# Patient Record
Sex: Female | Born: 1950
Health system: Southern US, Community
[De-identification: ages and names within clinical notes are randomized; demographics above are authoritative.]

## PROBLEM LIST (undated history)

## (undated) ENCOUNTER — Ambulatory Visit: Admission: EM | Source: Home / Self Care

## (undated) DIAGNOSIS — E1169 Type 2 diabetes mellitus with other specified complication: Secondary | ICD-10-CM

## (undated) DIAGNOSIS — K219 Gastro-esophageal reflux disease without esophagitis: Secondary | ICD-10-CM

## (undated) DIAGNOSIS — J302 Other seasonal allergic rhinitis: Secondary | ICD-10-CM

## (undated) DIAGNOSIS — M7601 Gluteal tendinitis, right hip: Secondary | ICD-10-CM

## (undated) DIAGNOSIS — I10 Essential (primary) hypertension: Secondary | ICD-10-CM

## (undated) DIAGNOSIS — G459 Transient cerebral ischemic attack, unspecified: Secondary | ICD-10-CM

## (undated) DIAGNOSIS — Z87440 Personal history of urinary (tract) infections: Secondary | ICD-10-CM

## (undated) DIAGNOSIS — M7551 Bursitis of right shoulder: Secondary | ICD-10-CM

## (undated) DIAGNOSIS — M797 Fibromyalgia: Secondary | ICD-10-CM

## (undated) DIAGNOSIS — F329 Major depressive disorder, single episode, unspecified: Secondary | ICD-10-CM

## (undated) DIAGNOSIS — S92352A Displaced fracture of fifth metatarsal bone, left foot, initial encounter for closed fracture: Secondary | ICD-10-CM

## (undated) DIAGNOSIS — Z8739 Personal history of other diseases of the musculoskeletal system and connective tissue: Secondary | ICD-10-CM

## (undated) DIAGNOSIS — M7062 Trochanteric bursitis, left hip: Secondary | ICD-10-CM

## (undated) DIAGNOSIS — E119 Type 2 diabetes mellitus without complications: Secondary | ICD-10-CM

## (undated) DIAGNOSIS — IMO0001 Reserved for inherently not codable concepts without codable children: Secondary | ICD-10-CM

## (undated) DIAGNOSIS — M5416 Radiculopathy, lumbar region: Secondary | ICD-10-CM

## (undated) DIAGNOSIS — M999 Biomechanical lesion, unspecified: Secondary | ICD-10-CM

## (undated) DIAGNOSIS — N390 Urinary tract infection, site not specified: Secondary | ICD-10-CM

## (undated) DIAGNOSIS — E559 Vitamin D deficiency, unspecified: Secondary | ICD-10-CM

## (undated) DIAGNOSIS — S9304XA Dislocation of right ankle joint, initial encounter: Secondary | ICD-10-CM

## (undated) DIAGNOSIS — J069 Acute upper respiratory infection, unspecified: Secondary | ICD-10-CM

## (undated) DIAGNOSIS — E785 Hyperlipidemia, unspecified: Secondary | ICD-10-CM

## (undated) DIAGNOSIS — Z8679 Personal history of other diseases of the circulatory system: Secondary | ICD-10-CM

## (undated) DIAGNOSIS — R002 Palpitations: Secondary | ICD-10-CM

## (undated) DIAGNOSIS — T887XXA Unspecified adverse effect of drug or medicament, initial encounter: Secondary | ICD-10-CM

## (undated) DIAGNOSIS — G43909 Migraine, unspecified, not intractable, without status migrainosus: Secondary | ICD-10-CM

## (undated) DIAGNOSIS — G479 Sleep disorder, unspecified: Secondary | ICD-10-CM

## (undated) DIAGNOSIS — M7542 Impingement syndrome of left shoulder: Secondary | ICD-10-CM

## (undated) DIAGNOSIS — J329 Chronic sinusitis, unspecified: Secondary | ICD-10-CM

## (undated) DIAGNOSIS — M7061 Trochanteric bursitis, right hip: Secondary | ICD-10-CM

## (undated) HISTORY — DX: Sleep disorder, unspecified: G47.9

## (undated) HISTORY — DX: Transient cerebral ischemic attack, unspecified: G45.9

## (undated) HISTORY — DX: Hyperlipidemia, unspecified: E78.5

## (undated) HISTORY — PX: TONSILLECTOMY: SUR1361

## (undated) HISTORY — DX: Essential (primary) hypertension: I10

## (undated) HISTORY — PX: COLONOSCOPY: SHX174

## (undated) HISTORY — DX: Personal history of urinary (tract) infections: Z87.440

## (undated) HISTORY — DX: Migraine, unspecified, not intractable, without status migrainosus: G43.909

## (undated) HISTORY — PX: WISDOM TOOTH EXTRACTION: SHX21

---
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
## 1981-12-14 HISTORY — PX: TOTAL ABDOMINAL HYSTERECTOMY: SHX209

## 1995-12-15 HISTORY — PX: CORONARY ANGIOPLASTY: SHX604

## 1998-08-04 ENCOUNTER — Emergency Department (HOSPITAL_COMMUNITY): Admission: EM | Admit: 1998-08-04 | Discharge: 1998-08-04 | Payer: Self-pay | Admitting: Emergency Medicine

## 1999-07-31 ENCOUNTER — Other Ambulatory Visit: Admission: RE | Admit: 1999-07-31 | Discharge: 1999-07-31 | Payer: Self-pay | Admitting: Obstetrics and Gynecology

## 2000-03-16 ENCOUNTER — Emergency Department (HOSPITAL_COMMUNITY): Admission: EM | Admit: 2000-03-16 | Discharge: 2000-03-16 | Payer: Self-pay | Admitting: Emergency Medicine

## 2000-12-14 HISTORY — PX: POLYPECTOMY: SHX149

## 2001-05-16 ENCOUNTER — Other Ambulatory Visit: Admission: RE | Admit: 2001-05-16 | Discharge: 2001-05-16 | Payer: Self-pay | Admitting: Obstetrics and Gynecology

## 2001-10-24 ENCOUNTER — Emergency Department (HOSPITAL_COMMUNITY): Admission: EM | Admit: 2001-10-24 | Discharge: 2001-10-24 | Payer: Self-pay | Admitting: Emergency Medicine

## 2001-10-24 ENCOUNTER — Encounter: Payer: Self-pay | Admitting: Emergency Medicine

## 2001-12-11 ENCOUNTER — Encounter: Payer: Self-pay | Admitting: Emergency Medicine

## 2001-12-11 ENCOUNTER — Emergency Department (HOSPITAL_COMMUNITY): Admission: EM | Admit: 2001-12-11 | Discharge: 2001-12-11 | Payer: Self-pay | Admitting: Emergency Medicine

## 2001-12-19 ENCOUNTER — Emergency Department (HOSPITAL_COMMUNITY): Admission: EM | Admit: 2001-12-19 | Discharge: 2001-12-19 | Payer: Self-pay

## 2002-10-20 ENCOUNTER — Other Ambulatory Visit: Admission: RE | Admit: 2002-10-20 | Discharge: 2002-10-20 | Payer: Self-pay | Admitting: Family Medicine

## 2002-12-26 ENCOUNTER — Inpatient Hospital Stay (HOSPITAL_COMMUNITY): Admission: AD | Admit: 2002-12-26 | Discharge: 2002-12-27 | Payer: Self-pay | Admitting: Internal Medicine

## 2002-12-26 ENCOUNTER — Encounter: Payer: Self-pay | Admitting: Internal Medicine

## 2003-01-01 ENCOUNTER — Emergency Department (HOSPITAL_COMMUNITY): Admission: EM | Admit: 2003-01-01 | Discharge: 2003-01-01 | Payer: Self-pay | Admitting: Emergency Medicine

## 2003-01-01 ENCOUNTER — Encounter: Payer: Self-pay | Admitting: Emergency Medicine

## 2003-06-08 ENCOUNTER — Encounter: Payer: Self-pay | Admitting: Family Medicine

## 2003-06-08 ENCOUNTER — Encounter: Admission: RE | Admit: 2003-06-08 | Discharge: 2003-06-08 | Payer: Self-pay | Admitting: Family Medicine

## 2003-10-26 ENCOUNTER — Other Ambulatory Visit: Admission: RE | Admit: 2003-10-26 | Discharge: 2003-10-26 | Payer: Self-pay | Admitting: Family Medicine

## 2003-12-31 ENCOUNTER — Encounter: Admission: RE | Admit: 2003-12-31 | Discharge: 2004-03-30 | Payer: Self-pay | Admitting: Family Medicine

## 2004-05-22 ENCOUNTER — Encounter: Admission: RE | Admit: 2004-05-22 | Discharge: 2004-08-20 | Payer: Self-pay | Admitting: Family Medicine

## 2004-12-31 ENCOUNTER — Ambulatory Visit: Payer: Self-pay | Admitting: Family Medicine

## 2005-02-13 ENCOUNTER — Ambulatory Visit: Payer: Self-pay | Admitting: Family Medicine

## 2005-03-20 ENCOUNTER — Ambulatory Visit: Payer: Self-pay | Admitting: Family Medicine

## 2005-04-01 ENCOUNTER — Ambulatory Visit: Payer: Self-pay | Admitting: Family Medicine

## 2005-04-07 ENCOUNTER — Ambulatory Visit: Payer: Self-pay | Admitting: Family Medicine

## 2005-04-08 ENCOUNTER — Ambulatory Visit: Payer: Self-pay | Admitting: Cardiology

## 2005-04-21 ENCOUNTER — Ambulatory Visit: Payer: Self-pay | Admitting: Family Medicine

## 2005-04-28 ENCOUNTER — Ambulatory Visit: Payer: Self-pay | Admitting: Family Medicine

## 2005-06-04 ENCOUNTER — Ambulatory Visit: Payer: Self-pay | Admitting: Family Medicine

## 2005-06-29 ENCOUNTER — Ambulatory Visit: Payer: Self-pay | Admitting: Family Medicine

## 2005-07-15 ENCOUNTER — Encounter: Admission: RE | Admit: 2005-07-15 | Discharge: 2005-07-15 | Payer: Self-pay | Admitting: Internal Medicine

## 2005-10-02 ENCOUNTER — Emergency Department (HOSPITAL_COMMUNITY): Admission: EM | Admit: 2005-10-02 | Discharge: 2005-10-02 | Payer: Self-pay | Admitting: Emergency Medicine

## 2005-11-17 ENCOUNTER — Ambulatory Visit: Payer: Self-pay | Admitting: Internal Medicine

## 2005-12-28 ENCOUNTER — Ambulatory Visit: Payer: Self-pay | Admitting: Internal Medicine

## 2006-02-02 ENCOUNTER — Ambulatory Visit: Payer: Self-pay | Admitting: Internal Medicine

## 2006-02-19 ENCOUNTER — Ambulatory Visit: Payer: Self-pay | Admitting: Internal Medicine

## 2006-03-05 ENCOUNTER — Ambulatory Visit: Payer: Self-pay | Admitting: Internal Medicine

## 2006-04-22 ENCOUNTER — Ambulatory Visit: Payer: Self-pay | Admitting: Internal Medicine

## 2006-04-30 ENCOUNTER — Ambulatory Visit: Payer: Self-pay | Admitting: Internal Medicine

## 2006-06-15 ENCOUNTER — Ambulatory Visit: Payer: Self-pay | Admitting: Internal Medicine

## 2006-08-23 ENCOUNTER — Ambulatory Visit: Payer: Self-pay | Admitting: Internal Medicine

## 2006-08-26 ENCOUNTER — Ambulatory Visit: Payer: Self-pay | Admitting: Internal Medicine

## 2006-11-10 ENCOUNTER — Ambulatory Visit: Payer: Self-pay | Admitting: Internal Medicine

## 2007-01-11 ENCOUNTER — Ambulatory Visit: Payer: Self-pay | Admitting: Internal Medicine

## 2007-01-11 LAB — CONVERTED CEMR LAB
Rhuematoid fact SerPl-aCnc: <20 intl units/mL — ABNORMAL LOW (ref 0.0–20.0)
Sed Rate: 14 mm/hr (ref 0–25)
Total CK: 102 units/L (ref 7–177)

## 2007-01-13 ENCOUNTER — Encounter: Payer: Self-pay | Admitting: Internal Medicine

## 2007-01-18 DIAGNOSIS — E785 Hyperlipidemia, unspecified: Secondary | ICD-10-CM

## 2007-01-18 DIAGNOSIS — E1169 Type 2 diabetes mellitus with other specified complication: Secondary | ICD-10-CM

## 2007-01-18 HISTORY — DX: Hyperlipidemia, unspecified: E78.5

## 2007-01-18 HISTORY — DX: Type 2 diabetes mellitus with other specified complication: E11.69

## 2007-02-24 ENCOUNTER — Ambulatory Visit: Payer: Self-pay | Admitting: Internal Medicine

## 2007-03-07 ENCOUNTER — Ambulatory Visit: Payer: Self-pay | Admitting: Internal Medicine

## 2007-03-07 LAB — CONVERTED CEMR LAB
Alkaline Phosphatase: 54 units/L (ref 39–117)
Basophils Relative: 1 % (ref 0.0–1.0)
Bilirubin, Direct: 0.1 mg/dL (ref 0.0–0.3)
CO2: 29 meq/L (ref 19–32)
Creatinine, Ser: 0.5 mg/dL (ref 0.4–1.2)
Eosinophils Relative: 4 % (ref 0.0–5.0)
GFR calc Af Amer: 164 mL/min
Glucose, Bld: 121 mg/dL — ABNORMAL HIGH (ref 70–99)
HCT: 37.4 % (ref 36.0–46.0)
HDL: 43 mg/dL (ref >39.0)
Hemoglobin: 13.3 g/dL (ref 12.0–15.0)
LDL Cholesterol: 80 mg/dL (ref 0–99)
Lymphocytes Relative: 27.9 % (ref 12.0–46.0)
Microalb, Ur: 0.2 mg/dL (ref 0.0–1.9)
Monocytes Absolute: 0.7 10*3/uL (ref 0.2–0.7)
Neutro Abs: 4 10*3/uL (ref 1.4–7.7)
Neutrophils Relative %: 57.9 % (ref 43.0–77.0)
Potassium: 3.9 meq/L (ref 3.5–5.1)
RDW: 12.6 % (ref 11.5–14.6)
Sodium: 143 meq/L (ref 135–145)
Total Bilirubin: 0.5 mg/dL (ref 0.3–1.2)
Total Protein: 6.7 g/dL (ref 6.0–8.3)
VLDL: 20 mg/dL (ref 0–40)

## 2007-05-30 ENCOUNTER — Ambulatory Visit: Payer: Self-pay | Admitting: Internal Medicine

## 2007-06-13 ENCOUNTER — Telehealth (INDEPENDENT_AMBULATORY_CARE_PROVIDER_SITE_OTHER): Payer: Self-pay | Admitting: *Deleted

## 2007-06-21 ENCOUNTER — Ambulatory Visit: Payer: Self-pay | Admitting: Internal Medicine

## 2007-07-01 ENCOUNTER — Encounter (INDEPENDENT_AMBULATORY_CARE_PROVIDER_SITE_OTHER): Payer: Self-pay | Admitting: *Deleted

## 2007-07-04 ENCOUNTER — Telehealth (INDEPENDENT_AMBULATORY_CARE_PROVIDER_SITE_OTHER): Payer: Self-pay | Admitting: *Deleted

## 2007-07-05 LAB — CONVERTED CEMR LAB: Microalb, Ur: 0.2 mg/dL (ref 0.0–1.9)

## 2007-07-06 ENCOUNTER — Encounter (INDEPENDENT_AMBULATORY_CARE_PROVIDER_SITE_OTHER): Payer: Self-pay | Admitting: *Deleted

## 2007-07-14 ENCOUNTER — Ambulatory Visit: Payer: Self-pay | Admitting: Internal Medicine

## 2007-07-14 DIAGNOSIS — R5381 Other malaise: Secondary | ICD-10-CM | POA: Insufficient documentation

## 2007-07-14 DIAGNOSIS — R5383 Other fatigue: Secondary | ICD-10-CM

## 2007-10-18 ENCOUNTER — Ambulatory Visit: Payer: Self-pay | Admitting: Internal Medicine

## 2007-10-18 ENCOUNTER — Telehealth (INDEPENDENT_AMBULATORY_CARE_PROVIDER_SITE_OTHER): Payer: Self-pay | Admitting: *Deleted

## 2007-10-22 LAB — CONVERTED CEMR LAB
Basophils Relative: 0.9 % (ref 0.0–1.0)
Eosinophils Relative: 3 % (ref 0.0–5.0)
Hemoglobin: 13.5 g/dL (ref 12.0–15.0)
INR: 0.9 (ref 0.8–1.0)
Monocytes Absolute: 0.5 10*3/uL (ref 0.2–0.7)
Monocytes Relative: 9 % (ref 3.0–11.0)
Platelets: 214 10*3/uL (ref 150–400)
Prothrombin Time: 11.4 s (ref 10.9–13.3)
RDW: 12.5 % (ref 11.5–14.6)
WBC: 5.5 10*3/uL (ref 4.5–10.5)
aPTT: 24.6 s (ref 21.7–29.8)

## 2007-10-24 ENCOUNTER — Encounter (INDEPENDENT_AMBULATORY_CARE_PROVIDER_SITE_OTHER): Payer: Self-pay | Admitting: *Deleted

## 2007-10-24 ENCOUNTER — Ambulatory Visit: Payer: Self-pay | Admitting: Gastroenterology

## 2007-11-03 ENCOUNTER — Encounter: Payer: Self-pay | Admitting: Internal Medicine

## 2007-11-03 ENCOUNTER — Ambulatory Visit (HOSPITAL_COMMUNITY): Admission: RE | Admit: 2007-11-03 | Discharge: 2007-11-03 | Payer: Self-pay | Admitting: Internal Medicine

## 2007-11-03 LAB — HM COLONOSCOPY

## 2007-11-09 ENCOUNTER — Ambulatory Visit: Payer: Self-pay | Admitting: Internal Medicine

## 2007-11-17 ENCOUNTER — Ambulatory Visit: Payer: Self-pay | Admitting: Internal Medicine

## 2008-02-02 ENCOUNTER — Ambulatory Visit: Payer: Self-pay | Admitting: Internal Medicine

## 2008-02-02 DIAGNOSIS — Z8679 Personal history of other diseases of the circulatory system: Secondary | ICD-10-CM

## 2008-02-02 DIAGNOSIS — IMO0001 Reserved for inherently not codable concepts without codable children: Secondary | ICD-10-CM

## 2008-02-02 DIAGNOSIS — M2550 Pain in unspecified joint: Secondary | ICD-10-CM | POA: Insufficient documentation

## 2008-02-02 DIAGNOSIS — T466X5A Adverse effect of antihyperlipidemic and antiarteriosclerotic drugs, initial encounter: Secondary | ICD-10-CM | POA: Insufficient documentation

## 2008-02-02 DIAGNOSIS — N809 Endometriosis, unspecified: Secondary | ICD-10-CM | POA: Insufficient documentation

## 2008-02-02 DIAGNOSIS — T887XXA Unspecified adverse effect of drug or medicament, initial encounter: Secondary | ICD-10-CM

## 2008-02-02 DIAGNOSIS — D126 Benign neoplasm of colon, unspecified: Secondary | ICD-10-CM | POA: Insufficient documentation

## 2008-02-02 HISTORY — DX: Personal history of other diseases of the circulatory system: Z86.79

## 2008-02-02 HISTORY — DX: Reserved for inherently not codable concepts without codable children: IMO0001

## 2008-02-02 HISTORY — DX: Unspecified adverse effect of drug or medicament, initial encounter: T88.7XXA

## 2008-02-04 LAB — CONVERTED CEMR LAB
AST: 22 units/L (ref 0–37)
Basophils Relative: 2.8 % — ABNORMAL HIGH (ref 0.0–1.0)
Bilirubin, Direct: 0.1 mg/dL (ref 0.0–0.3)
HCT: 39.9 % (ref 36.0–46.0)
Hemoglobin: 13.2 g/dL (ref 12.0–15.0)
Hgb A1c MFr Bld: 6 % (ref 4.6–6.0)
Lymphocytes Relative: 57.2 % — ABNORMAL HIGH (ref 12.0–46.0)
Magnesium: 2.2 mg/dL (ref 1.5–2.5)
Monocytes Absolute: 0.5 10*3/uL (ref 0.2–0.7)
Neutrophils Relative %: 20.4 % — ABNORMAL LOW (ref 43.0–77.0)
RDW: 12.2 % (ref 11.5–14.6)
Rhuematoid fact SerPl-aCnc: <20 intl units/mL — ABNORMAL LOW (ref 0.0–20.0)
TSH: 1.33 microintl units/mL (ref 0.35–5.50)
Total Bilirubin: 0.5 mg/dL (ref 0.3–1.2)
Total Protein: 7.1 g/dL (ref 6.0–8.3)
Vit D, 1,25-Dihydroxy: 13 — ABNORMAL LOW (ref 30–89)

## 2008-02-06 ENCOUNTER — Encounter (INDEPENDENT_AMBULATORY_CARE_PROVIDER_SITE_OTHER): Payer: Self-pay | Admitting: *Deleted

## 2008-02-07 ENCOUNTER — Telehealth (INDEPENDENT_AMBULATORY_CARE_PROVIDER_SITE_OTHER): Payer: Self-pay | Admitting: *Deleted

## 2008-02-09 ENCOUNTER — Encounter (INDEPENDENT_AMBULATORY_CARE_PROVIDER_SITE_OTHER): Payer: Self-pay | Admitting: *Deleted

## 2008-02-23 ENCOUNTER — Telehealth (INDEPENDENT_AMBULATORY_CARE_PROVIDER_SITE_OTHER): Payer: Self-pay | Admitting: *Deleted

## 2008-02-24 ENCOUNTER — Telehealth (INDEPENDENT_AMBULATORY_CARE_PROVIDER_SITE_OTHER): Payer: Self-pay | Admitting: *Deleted

## 2008-03-05 ENCOUNTER — Encounter: Payer: Self-pay | Admitting: Internal Medicine

## 2008-03-13 ENCOUNTER — Encounter: Payer: Self-pay | Admitting: Internal Medicine

## 2008-03-14 ENCOUNTER — Ambulatory Visit: Payer: Self-pay | Admitting: Internal Medicine

## 2008-03-14 DIAGNOSIS — I10 Essential (primary) hypertension: Secondary | ICD-10-CM

## 2008-03-14 HISTORY — DX: Essential (primary) hypertension: I10

## 2008-04-05 ENCOUNTER — Telehealth (INDEPENDENT_AMBULATORY_CARE_PROVIDER_SITE_OTHER): Payer: Self-pay | Admitting: *Deleted

## 2008-04-09 ENCOUNTER — Encounter: Payer: Self-pay | Admitting: Internal Medicine

## 2008-04-10 ENCOUNTER — Telehealth (INDEPENDENT_AMBULATORY_CARE_PROVIDER_SITE_OTHER): Payer: Self-pay | Admitting: *Deleted

## 2008-04-11 ENCOUNTER — Ambulatory Visit: Payer: Self-pay | Admitting: Internal Medicine

## 2008-04-11 DIAGNOSIS — M545 Low back pain, unspecified: Secondary | ICD-10-CM | POA: Insufficient documentation

## 2008-04-11 DIAGNOSIS — M5416 Radiculopathy, lumbar region: Secondary | ICD-10-CM

## 2008-04-11 HISTORY — DX: Radiculopathy, lumbar region: M54.16

## 2008-04-11 LAB — CONVERTED CEMR LAB
Bilirubin Urine: NEGATIVE
Glucose, Urine, Semiquant: NEGATIVE
Urobilinogen, UA: NEGATIVE

## 2008-04-13 ENCOUNTER — Telehealth (INDEPENDENT_AMBULATORY_CARE_PROVIDER_SITE_OTHER): Payer: Self-pay | Admitting: *Deleted

## 2008-04-18 ENCOUNTER — Encounter: Payer: Self-pay | Admitting: Internal Medicine

## 2008-04-18 ENCOUNTER — Encounter (INDEPENDENT_AMBULATORY_CARE_PROVIDER_SITE_OTHER): Payer: Self-pay | Admitting: *Deleted

## 2008-04-23 ENCOUNTER — Telehealth (INDEPENDENT_AMBULATORY_CARE_PROVIDER_SITE_OTHER): Payer: Self-pay | Admitting: *Deleted

## 2008-04-24 ENCOUNTER — Encounter: Admission: RE | Admit: 2008-04-24 | Discharge: 2008-05-31 | Payer: Self-pay | Admitting: Internal Medicine

## 2008-04-25 ENCOUNTER — Encounter: Payer: Self-pay | Admitting: Internal Medicine

## 2008-05-25 ENCOUNTER — Ambulatory Visit: Payer: Self-pay | Admitting: Internal Medicine

## 2008-05-25 DIAGNOSIS — E559 Vitamin D deficiency, unspecified: Secondary | ICD-10-CM

## 2008-05-25 HISTORY — DX: Vitamin D deficiency, unspecified: E55.9

## 2008-05-25 LAB — CONVERTED CEMR LAB: Cholesterol, target level: 200 mg/dL

## 2008-05-28 LAB — CONVERTED CEMR LAB
ALT: 24 units/L (ref 0–35)
AST: 22 units/L (ref 0–37)
Alkaline Phosphatase: 91 units/L (ref 39–117)
Bilirubin, Direct: 0.1 mg/dL (ref 0.0–0.3)
CO2: 27 meq/L (ref 19–32)
Chloride: 104 meq/L (ref 96–112)
Glucose, Bld: 122 mg/dL — ABNORMAL HIGH (ref 70–99)
Hemoglobin: 13.6 g/dL (ref 12.0–15.0)
LDL Cholesterol: 83 mg/dL (ref 0–99)
Lymphocytes Relative: 38.4 % (ref 12.0–46.0)
Monocytes Relative: 7.8 % (ref 3.0–12.0)
Neutro Abs: 2.5 10*3/uL (ref 1.4–7.7)
Platelets: 227 10*3/uL (ref 150–400)
Potassium: 4.1 meq/L (ref 3.5–5.1)
RDW: 12.6 % (ref 11.5–14.6)
Sodium: 140 meq/L (ref 135–145)
Total CHOL/HDL Ratio: 3.4
Total Protein: 7.4 g/dL (ref 6.0–8.3)
Triglycerides: 79 mg/dL (ref 0–149)

## 2008-05-31 ENCOUNTER — Encounter: Payer: Self-pay | Admitting: Internal Medicine

## 2008-06-06 ENCOUNTER — Encounter (INDEPENDENT_AMBULATORY_CARE_PROVIDER_SITE_OTHER): Payer: Self-pay | Admitting: *Deleted

## 2008-06-06 LAB — CONVERTED CEMR LAB: Vit D, 1,25-Dihydroxy: 26 — ABNORMAL LOW (ref 30–89)

## 2008-10-04 ENCOUNTER — Encounter: Payer: Self-pay | Admitting: Internal Medicine

## 2008-10-04 ENCOUNTER — Encounter: Payer: Self-pay | Admitting: Family Medicine

## 2008-10-06 ENCOUNTER — Encounter: Payer: Self-pay | Admitting: Family Medicine

## 2008-11-22 ENCOUNTER — Ambulatory Visit: Payer: Self-pay | Admitting: Internal Medicine

## 2008-11-25 LAB — CONVERTED CEMR LAB: Hgb A1c MFr Bld: 6.3 % — ABNORMAL HIGH (ref 4.6–6.0)

## 2008-11-26 ENCOUNTER — Encounter (INDEPENDENT_AMBULATORY_CARE_PROVIDER_SITE_OTHER): Payer: Self-pay | Admitting: *Deleted

## 2009-01-29 ENCOUNTER — Telehealth (INDEPENDENT_AMBULATORY_CARE_PROVIDER_SITE_OTHER): Payer: Self-pay | Admitting: *Deleted

## 2009-02-14 ENCOUNTER — Telehealth (INDEPENDENT_AMBULATORY_CARE_PROVIDER_SITE_OTHER): Payer: Self-pay | Admitting: *Deleted

## 2009-03-05 ENCOUNTER — Ambulatory Visit: Payer: Self-pay | Admitting: Family Medicine

## 2009-03-05 ENCOUNTER — Telehealth (INDEPENDENT_AMBULATORY_CARE_PROVIDER_SITE_OTHER): Payer: Self-pay | Admitting: *Deleted

## 2009-03-08 ENCOUNTER — Encounter: Admission: RE | Admit: 2009-03-08 | Discharge: 2009-03-08 | Payer: Self-pay | Admitting: Family Medicine

## 2009-03-12 ENCOUNTER — Telehealth (INDEPENDENT_AMBULATORY_CARE_PROVIDER_SITE_OTHER): Payer: Self-pay | Admitting: *Deleted

## 2009-03-22 ENCOUNTER — Encounter: Payer: Self-pay | Admitting: Internal Medicine

## 2009-03-25 ENCOUNTER — Telehealth (INDEPENDENT_AMBULATORY_CARE_PROVIDER_SITE_OTHER): Payer: Self-pay | Admitting: *Deleted

## 2009-03-29 ENCOUNTER — Ambulatory Visit (HOSPITAL_BASED_OUTPATIENT_CLINIC_OR_DEPARTMENT_OTHER): Admission: RE | Admit: 2009-03-29 | Discharge: 2009-03-29 | Payer: Self-pay | Admitting: General Surgery

## 2009-03-29 ENCOUNTER — Encounter: Payer: Self-pay | Admitting: Internal Medicine

## 2009-03-29 ENCOUNTER — Encounter (INDEPENDENT_AMBULATORY_CARE_PROVIDER_SITE_OTHER): Payer: Self-pay | Admitting: General Surgery

## 2009-04-05 ENCOUNTER — Telehealth (INDEPENDENT_AMBULATORY_CARE_PROVIDER_SITE_OTHER): Payer: Self-pay | Admitting: *Deleted

## 2009-04-11 ENCOUNTER — Encounter: Payer: Self-pay | Admitting: Family Medicine

## 2009-05-24 ENCOUNTER — Encounter: Payer: Self-pay | Admitting: Internal Medicine

## 2009-05-30 ENCOUNTER — Encounter: Payer: Self-pay | Admitting: Internal Medicine

## 2009-06-07 ENCOUNTER — Ambulatory Visit: Payer: Self-pay | Admitting: Internal Medicine

## 2009-06-07 LAB — CONVERTED CEMR LAB: LDL Goal: 100 mg/dL

## 2009-06-10 ENCOUNTER — Encounter (INDEPENDENT_AMBULATORY_CARE_PROVIDER_SITE_OTHER): Payer: Self-pay | Admitting: *Deleted

## 2009-06-17 LAB — CONVERTED CEMR LAB
ALT: 31 units/L (ref 0–35)
AST: 26 units/L (ref 0–37)
Albumin: 4.2 g/dL (ref 3.5–5.2)
BUN: 15 mg/dL (ref 6–23)
Chloride: 105 meq/L (ref 96–112)
Cholesterol: 145 mg/dL (ref 0–200)
Eosinophils Relative: 2.3 % (ref 0.0–5.0)
Glucose, Bld: 131 mg/dL — ABNORMAL HIGH (ref 70–99)
HCT: 38.3 % (ref 36.0–46.0)
Hemoglobin: 13.1 g/dL (ref 12.0–15.0)
Lymphs Abs: 1.7 10*3/uL (ref 0.7–4.0)
MCV: 88.2 fL (ref 78.0–100.0)
Monocytes Absolute: 0.4 10*3/uL (ref 0.1–1.0)
Neutro Abs: 2.7 10*3/uL (ref 1.4–7.7)
Platelets: 201 10*3/uL (ref 150.0–400.0)
Potassium: 3.8 meq/L (ref 3.5–5.1)
RDW: 12.2 % (ref 11.5–14.6)
Sodium: 141 meq/L (ref 135–145)
TSH: 1.1 microintl units/mL (ref 0.35–5.50)
Total Bilirubin: 0.7 mg/dL (ref 0.3–1.2)
Total CK: 115 units/L (ref 7–177)
Total Protein: 7 g/dL (ref 6.0–8.3)
WBC: 4.9 10*3/uL (ref 4.5–10.5)

## 2009-06-21 ENCOUNTER — Encounter (INDEPENDENT_AMBULATORY_CARE_PROVIDER_SITE_OTHER): Payer: Self-pay | Admitting: *Deleted

## 2009-06-21 ENCOUNTER — Telehealth (INDEPENDENT_AMBULATORY_CARE_PROVIDER_SITE_OTHER): Payer: Self-pay | Admitting: *Deleted

## 2009-06-26 ENCOUNTER — Ambulatory Visit: Payer: Self-pay | Admitting: Internal Medicine

## 2009-09-04 ENCOUNTER — Ambulatory Visit: Payer: Self-pay | Admitting: Internal Medicine

## 2009-09-09 ENCOUNTER — Encounter (INDEPENDENT_AMBULATORY_CARE_PROVIDER_SITE_OTHER): Payer: Self-pay | Admitting: *Deleted

## 2009-09-09 LAB — CONVERTED CEMR LAB: Hgb A1c MFr Bld: 6.6 % — ABNORMAL HIGH (ref 4.6–6.5)

## 2009-09-27 ENCOUNTER — Encounter: Payer: Self-pay | Admitting: Internal Medicine

## 2009-12-10 ENCOUNTER — Encounter: Payer: Self-pay | Admitting: Internal Medicine

## 2010-03-03 ENCOUNTER — Telehealth (INDEPENDENT_AMBULATORY_CARE_PROVIDER_SITE_OTHER): Payer: Self-pay | Admitting: *Deleted

## 2010-03-25 ENCOUNTER — Telehealth (INDEPENDENT_AMBULATORY_CARE_PROVIDER_SITE_OTHER): Payer: Self-pay | Admitting: *Deleted

## 2010-03-27 ENCOUNTER — Ambulatory Visit: Payer: Self-pay | Admitting: Internal Medicine

## 2010-03-27 DIAGNOSIS — F339 Major depressive disorder, recurrent, unspecified: Secondary | ICD-10-CM | POA: Insufficient documentation

## 2010-03-27 DIAGNOSIS — F329 Major depressive disorder, single episode, unspecified: Secondary | ICD-10-CM | POA: Insufficient documentation

## 2010-03-27 DIAGNOSIS — F3289 Other specified depressive episodes: Secondary | ICD-10-CM

## 2010-03-27 HISTORY — DX: Other specified depressive episodes: F32.89

## 2010-03-27 HISTORY — DX: Major depressive disorder, single episode, unspecified: F32.9

## 2010-04-17 ENCOUNTER — Ambulatory Visit: Payer: Self-pay | Admitting: Internal Medicine

## 2010-04-22 LAB — CONVERTED CEMR LAB
BUN: 18 mg/dL (ref 6–23)
Creatinine,U: 54.9 mg/dL
Hgb A1c MFr Bld: 7 % — ABNORMAL HIGH (ref 4.6–6.5)
Microalb Creat Ratio: 1.3 mg/g (ref 0.0–30.0)
Microalb, Ur: 0.7 mg/dL (ref 0.0–1.9)

## 2010-05-13 ENCOUNTER — Telehealth (INDEPENDENT_AMBULATORY_CARE_PROVIDER_SITE_OTHER): Payer: Self-pay | Admitting: *Deleted

## 2010-06-27 ENCOUNTER — Encounter: Payer: Self-pay | Admitting: Internal Medicine

## 2010-07-07 ENCOUNTER — Ambulatory Visit: Payer: Self-pay | Admitting: Internal Medicine

## 2010-07-15 ENCOUNTER — Encounter: Payer: Self-pay | Admitting: Internal Medicine

## 2010-07-15 ENCOUNTER — Telehealth: Payer: Self-pay | Admitting: Internal Medicine

## 2010-07-15 LAB — CONVERTED CEMR LAB: Hgb A1c MFr Bld: 6.9 % — ABNORMAL HIGH (ref 4.6–6.5)

## 2010-07-30 ENCOUNTER — Ambulatory Visit: Payer: Self-pay | Admitting: Internal Medicine

## 2010-08-01 LAB — CONVERTED CEMR LAB
ALT: 48 units/L — ABNORMAL HIGH (ref 0–35)
AST: 32 units/L (ref 0–37)
Basophils Absolute: 0 10*3/uL (ref 0.0–0.1)
Basophils Relative: 0.7 % (ref 0.0–3.0)
Eosinophils Absolute: 0.1 10*3/uL (ref 0.0–0.7)
HCT: 39.5 % (ref 36.0–46.0)
Hemoglobin: 13.3 g/dL (ref 12.0–15.0)
Lymphocytes Relative: 33.9 % (ref 12.0–46.0)
Lymphs Abs: 1.9 10*3/uL (ref 0.7–4.0)
MCHC: 33.7 g/dL (ref 30.0–36.0)
MCV: 89.1 fL (ref 78.0–100.0)
Neutro Abs: 3 10*3/uL (ref 1.4–7.7)
RBC: 4.43 M/uL (ref 3.87–5.11)
RDW: 13.7 % (ref 11.5–14.6)
Total Bilirubin: 0.4 mg/dL (ref 0.3–1.2)
Total Protein: 7.1 g/dL (ref 6.0–8.3)

## 2010-09-26 ENCOUNTER — Ambulatory Visit: Payer: Self-pay | Admitting: Internal Medicine

## 2010-09-26 DIAGNOSIS — K117 Disturbances of salivary secretion: Secondary | ICD-10-CM | POA: Insufficient documentation

## 2010-10-03 LAB — CONVERTED CEMR LAB
ALT: 56 units/L — ABNORMAL HIGH (ref 0–35)
Albumin: 4.2 g/dL (ref 3.5–5.2)
BUN: 13 mg/dL (ref 6–23)
Basophils Relative: 0.7 % (ref 0.0–3.0)
CO2: 29 meq/L (ref 19–32)
Chloride: 104 meq/L (ref 96–112)
Cholesterol: 156 mg/dL (ref 0–200)
Creatinine, Ser: 0.6 mg/dL (ref 0.4–1.2)
Eosinophils Absolute: 0.1 10*3/uL (ref 0.0–0.7)
Eosinophils Relative: 1.8 % (ref 0.0–5.0)
HCT: 39.5 % (ref 36.0–46.0)
Hgb A1c MFr Bld: 7.3 % — ABNORMAL HIGH (ref 4.6–6.5)
LDL Cholesterol: 89 mg/dL (ref 0–99)
Lymphs Abs: 2 10*3/uL (ref 0.7–4.0)
MCHC: 34.3 g/dL (ref 30.0–36.0)
MCV: 88.7 fL (ref 78.0–100.0)
Microalb, Ur: 0.7 mg/dL (ref 0.0–1.9)
Monocytes Absolute: 0.5 10*3/uL (ref 0.1–1.0)
Potassium: 4.2 meq/L (ref 3.5–5.1)
RBC: 4.45 M/uL (ref 3.87–5.11)
TSH: 1.45 microintl units/mL (ref 0.35–5.50)
Total CHOL/HDL Ratio: 4
Total Protein: 6.9 g/dL (ref 6.0–8.3)
Triglycerides: 121 mg/dL (ref 0.0–149.0)
WBC: 5.6 10*3/uL (ref 4.5–10.5)

## 2010-10-14 ENCOUNTER — Telehealth: Payer: Self-pay | Admitting: Internal Medicine

## 2010-11-21 ENCOUNTER — Encounter: Payer: Self-pay | Admitting: Internal Medicine

## 2010-11-21 ENCOUNTER — Ambulatory Visit: Payer: Self-pay | Admitting: Internal Medicine

## 2010-11-24 LAB — CONVERTED CEMR LAB
ALT: 64 units/L — ABNORMAL HIGH (ref 0–35)
Vit D, 25-Hydroxy: 39 ng/mL (ref 30–89)

## 2011-01-13 NOTE — Progress Notes (Signed)
Summary: Metformin vs Janumet   Phone Note Call from Patient Call back at [facility] Phone [phone]   Summary of Call: Patient would like to know if she can take Metformin two times a day (she states she told you that she was only taking it once daily) instead of starting Janumet. I made her aware to be sure that her med list is correct from now on, MD cannot remember what is not documented.  Please advise if the patient should take Metformin two times a day or start Janumet. Initial call taken by: Lucious Groves CMA,  October 14, 2010 11:21 AM  Follow-up for Phone Call        Per MD patient should try metformin two times a day and re-check a1c in 6 weeks.  Lmom tcb. Lucious Groves CMA  October 14, 2010 3:13 PM   Patient notified. Lucious Groves CMA  October 14, 2010 4:31 PM

## 2011-01-13 NOTE — Assessment & Plan Note (Signed)
Summary: problem with nerves/kdc   Vital Signs:  Patient profile:   60 year old female Weight:      182.8 pounds BMI:     30.07 Temp:     98.9 degrees F oral Pulse rate:   94 / minute Resp:     15 per minute BP sitting:   130 / 84  (left arm) Cuff size:   large  Vitals Entered By: Shonna Chock (March 27, 2010 12:00 PM) CC: Patient's son recently deceased and patient would like to discuss meds (Alprazolam/Celexa), Patient with frequent crying spells about 85% of the day. Comments REVIEWED MED LIST, PATIENT AGREED DOSE AND INSTRUCTION CORRECT    CC:  Patient's son recently deceased and patient would like to discuss meds (Alprazolam/Celexa) and Patient with frequent crying spells about 85% of the day.[name]  History of Present Illness: She lost her son due to inadvertent overdose of meds in context of alcohol intake. Citalopram 20 mg has been an effective tool ; she is having to deal with estate management.  Allergies: 1)  * Vioxx (Rofecoxib) 2)  Prednisone (Prednisone) 3)  Welchol (Colesevelam Hcl)  Family History: M aunt had colon cancer; P uncle DM Father: MI @ 20 Mother: COAD Siblings: bro MI @ 59; bro MIs onset  in 34s,  lung cancer,CBAG;niece committed suicide  Review of Systems Psych:  Complains of anxiety, depression, easily tearful, and panic attacks; denies easily angered, irritability, and suicidal thoughts/plans.  Physical Exam  General:  in obvious emotional  pain &  distress; appropriate and cooperative throughout examination Eyes:  No lid lag Neck:  No deformities, masses, or tenderness noted. Heart:  Normal rate and regular rhythm. S1 and S2 normal without gallop, murmur, click, rub.S4 Neurologic:  DTRs symmetrical and normal.  Fine hand tremor bilaterally Psych:  Oriented X3, depressed affect, and tearful.     Impression & Recommendations:  Problem # 1:  DEPRESSIVE DISORDER NOT ELSEWHERE CLASSIFIED (ICD-311)  Her updated medication list for this problem  includes:    Lorazepam 0.5 Mg Tabs (Lorazepam) .[name]... Take 1 tab q 6-8 hrs as needed  Complete Medication List: 1)  Fish Oil 1000 Mg Caps (Omega-3 fatty acids) .... Take one tablet daily 2)  Aspirin Ec 81 Mg Tbec (Aspirin) .... Take 1 tablet by mouth every morning 3)  Lisinopril 20 Mg Tabs (Lisinopril) .... Take 1 tablet by mouth once a day 4)  Vytorin 10-20 Mg Tabs (Ezetimibe-simvastatin) .[name].. 1 at bedtime**labs due now** 5)  Tramadol Hcl 50 Mg Tabs (Tramadol hcl) .... Takje 1/2 to 1 every 6 hours 6)  Freestyle Lite Strp (Glucose blood) .... Use as directed 7)  Vitamin D  .... Takes 1 cc (8000 international units)  mon , weds , fri & sun 8)  Citalopram 40 Mg  .... Take one tablet daily 9)  Cyclobenzaprine Hcl 10 Mg Tabs (Cyclobenzaprine hcl) .[name].. 1 at bedtime as needed 10)  Omeprazole 20 Mg Cpdr (Omeprazole) .[name].. 1 q am 11)  Lorazepam 0.5 Mg Tabs (Lorazepam) .... Take 1 tab q 6-8 hrs as needed  Patient Instructions: 1)  Consider all 4 options as we discussed. 2)  Please schedule a follow-up appointment i n3  weeks. Prescriptions: CITALOPRAM  40 MG take one tablet daily  #90 x 1   Entered and Authorized by:   Marga Melnick MD   Signed by:   Marga Melnick MD on 03/27/2010   Method used:   Print then Give to Patient   RxID:  [id number] LORAZEPAM 0.5 MG TABS (LORAZEPAM) Take 1 tab q 6-8 hrs as needed  #30 x 5   Entered and Authorized by:   Marga Melnick MD   Signed by:   Marga Melnick MD on 03/27/2010   Method used:   Print then Give to Patient   RxID:   [id number]

## 2011-01-13 NOTE — Progress Notes (Signed)
Summary: refill  Phone Note Refill Request Message from:  Patient on July 15, 2010 4:35 PM  Refills Requested: Medication #1:  LISINOPRIL 20 MG TABS Take 1 tablet by mouth once a day patient checked with [facility] 3 times they said they had a denial from dr hopper - but no refill  request please send again [facility]  Initial call taken by: Okey Regal [place],  July 15, 2010 4:36 PM    Prescriptions: LISINOPRIL 20 MG TABS (LISINOPRIL) Take 1 tablet by mouth once a day  #90 Each x 2   Entered by:   Lucious Groves CMA   Authorized by:   Marga Melnick MD   Signed by:   Lucious Groves CMA on 07/16/2010   Method used:   [facility]        [facility]       [address]       [place], [place]  [address]       Ph: [phone]       Fax: [phone]   RxID:   [id number]

## 2011-01-13 NOTE — Progress Notes (Signed)
Summary: rx med just lost son  Phone Note Call from Patient Call back at Home Phone [phone] Call back at Work Phone [phone]   Caller: Patient Summary of Call: Pt left VM stating that she recently lost her son and is having a hard time dealing with it and working. pt would like to know if you could possible rx her something to help her deal with these situation. pt states that in the past she has been on lorazepam which has help but she does not currently have any of this med. pt would like to know if you would consider rx this med wtihout her coming in for OV...........Marland KitchenFelecia Deloach CMA  March 03, 2010 12:11 PM   Follow-up for Phone Call        Lorazepam 0.5 mg q 6-8 hrs as needed #30 Follow-up by: Marga Melnick MD,  March 03, 2010 12:43 PM  Additional Follow-up for Phone Call Additional follow up Details #1::        left message to call office..............Marland KitchenFelecia Deloach CMA  March 03, 2010 12:57 PM   pt return call pt [facility] [address]...Marland KitchenMarland KitchenFelecia Deloach CMA  March 03, 2010 2:44 PM      Additional Follow-up for Phone Call Additional follow up Details #2::    pt aware rx sent to pharmacy..........................Marland KitchenFelecia Deloach CMA  March 03, 2010 2:45 PM   New/Updated Medications: LORAZEPAM 0.5 MG TABS (LORAZEPAM) Take 1 tab q 6-8 hrs as needed Prescriptions: LORAZEPAM 0.5 MG TABS (LORAZEPAM) Take 1 tab q 6-8 hrs as needed  #30 x 0   Entered by:   Jeremy Johann CMA   Authorized by:   Marga Melnick MD   Signed by:   Jeremy Johann CMA on 03/03/2010   Method used:   Printed then faxed to ...       [facility]  [id number]       [address]       [place], [place]  [address]       Ph: [phone] or [phone]       Fax: [phone]   RxID:   [id number]

## 2011-01-13 NOTE — Assessment & Plan Note (Signed)
Summary: CHEST DISCOMFORT FOR A COUPLE OF DAYS/KB   Vital Signs:  Patient profile:   60 year old female Height:      65.50 inches (166.37 cm) Weight:      185.13 pounds (84.15 kg) BMI:     30.45 Temp:     98.0 degrees F (36.67 degrees C) oral Resp:     17 per minute BP sitting:   130 / 80  (left arm) Cuff size:   regular  Vitals Entered By: Lucious Groves CMA (July 30, 2010 11:17 AM) CC: C/O chest discomfort for a couple of days./kb Is Patient Diabetic? No Pain Assessment Patient in pain? yes     Location: chest Intensity: 6 Type: aching Onset of pain  couple of days Comments Patient notes that it is not indigestion and she also has a pain that radiates from her neck to her back. Lucious Groves CMA  July 30, 2010 11:18 AM    CC:  C/O chest discomfort for a couple of days./kb.  History of Present Illness:       This is a 60 year old female who presents with Chest pain since 08/15 w/o trigger.  The patient reports diaphoresis ( a chronic issue, not new) and shortness of breath with stairs today . She has resting chest pain w/o  nausea, vomiting, palpitations, dizziness, light headedness, syncope, and indigestion.  The pain is described as constant and heavy in SS area since 08/15 but also there is intermittent sharp pain in back , neck  right upper arm  lasting  seconds .  The pain  was  made worse by  lunch of tuna salad  08/16 .  The pain was not  relieved or improved with antacids (TUMS).  PMH of colon polyps; no GB disease or DUD.  Current Medications (verified): 1)  Fish Oil 1000 Mg Caps (Omega-3 Fatty Acids) .... Take One Tablet Daily 2)  Aspirin Ec 81 Mg Tbec (Aspirin) .... Take 1 Tablet By Mouth Every Morning 3)  Lisinopril 20 Mg Tabs (Lisinopril) .... Take 1 Tablet By Mouth Once A Day 4)  Vytorin 10-20 Mg Tabs (Ezetimibe-Simvastatin) .[name].. 1 At Bedtime**labs Due Now** 5)  Tramadol Hcl 50 Mg  Tabs (Tramadol Hcl) .... Takje 1/2 To 1 Every 6 Hours 6)  Freestyle Lite   Strp  (Glucose Blood) .... Use As Directed 7)  Vitamin D .... Takes 1 Cc (8000 International Units)  Mon , Weds , Fri & Sun 8)  Citalopram  40 Mg .... Take One Tablet Daily 9)  Cyclobenzaprine Hcl 10 Mg Tabs (Cyclobenzaprine Hcl) .[name].. 1 At Bedtime As Needed 10)  Omeprazole 20 Mg Cpdr (Omeprazole) .[name].. 1 Q Am 11)  Lorazepam 0.5 Mg Tabs (Lorazepam) .... Take 1 Tab Q 6-8 Hrs As Needed 12)  Metformin Hcl 500 Mg Xr24h-Tab (Metformin Hcl) .[name].. 1 Two Times A Day With 2 Largest Meals  Allergies (verified): 1)  * Vioxx (Rofecoxib) 2)  Prednisone (Prednisone) 3)  Welchol (Colesevelam Hcl)  Review of Systems General:  Denies chills and fever. ENT:  Denies difficulty swallowing and hoarseness. GI:  Denies change in bowel habits and dark tarry stools. Derm:  Denies lesion(s) and rash. Neuro:  Denies brief paralysis, numbness, tingling, and weakness. Psych:  Seeing Dr Margaretmary Eddy for grief counselling; last week was very stressful.  Physical Exam  General:  well-nourished,in no acute distress; alert,appropriate and cooperative throughout examination Eyes:  No corneal or conjunctival inflammation noted.Perrla.  No icterus Mouth:  Oral mucosa and  oropharynx without lesions or exudates.  Teeth in good repair. No pharyngeal erythema.   Neck:  No deformities, masses, or tenderness noted.Full ROM Lungs:  Normal respiratory effort, chest expands symmetrically. Lungs are clear to auscultation, no crackles or wheezes. Heart:  Normal rate and regular rhythm. S1 and S2 normal without gallop, murmur, click, rub . S4 Abdomen:  Bowel sounds positive,abdomen soft  but tender  in epigastrium without masses, organomegaly or hernias noted. Rectal:  given stool cards Msk:  No deformity or scoliosis noted of thoracic or lumbar spine.   Pulses:  R and L carotid,radial,dorsalis pedis and posterior tibial pulses are full and equal bilaterally Extremities:  No clubbing, cyanosis, edema. Neg Homan's. Full ROM both UE Neurologic:   alert & oriented X3, strength normal in all extremities, gait normal, and DTRs symmetrical and normal.  Temor of hands Skin:  Intact without suspicious lesions or rashes Cervical Nodes:  No lymphadenopathy noted Axillary Nodes:  No palpable lymphadenopathy Psych:  memory intact for recent and remote, normally interactive, and good eye contact.     Impression & Recommendations:  Problem # 1:  CHEST PAIN (ICD-786.50)  Epigastric tenderness  Orders: EKG w/ Interpretation (93000) Venipuncture (30160) TLB-Cardiac Panel (10932_35573-UKGU) T-D-Dimer Fibrin Derivatives Quantitive [id number]) T-Troponin I (28315-17616)  Problem # 2:  ABDOMINAL PAIN, EPIGASTRIC (ICD-789.06)  Orders: Venipuncture (07371) TLB-CBC Platelet - w/Differential (85025-CBCD) TLB-Amylase (82150-AMYL) TLB-Lipase (83690-LIPASE) TLB-Hepatic/Liver Function Pnl (80076-HEPATIC)  Complete Medication List: 1)  Fish Oil 1000 Mg Caps (Omega-3 fatty acids) .... Take one tablet daily 2)  Aspirin Ec 81 Mg Tbec (Aspirin) .... Take 1 tablet by mouth every morning 3)  Lisinopril 20 Mg Tabs (Lisinopril) .... Take 1 tablet by mouth once a day 4)  Vytorin 10-20 Mg Tabs (Ezetimibe-simvastatin) .[name].. 1 at bedtime**labs due now** 5)  Tramadol Hcl 50 Mg Tabs (Tramadol hcl) .... Takje 1/2 to 1 every 6 hours 6)  Freestyle Lite Strp (Glucose blood) .... Use as directed 7)  Vitamin D  .... Takes 1 cc (8000 international units)  mon , weds , fri & sun 8)  Citalopram 40 Mg  .... Take one tablet daily 9)  Cyclobenzaprine Hcl 10 Mg Tabs (Cyclobenzaprine hcl) .[name].. 1 at bedtime as needed 10)  Omeprazole 20 Mg Cpdr (Omeprazole) .[name].. 1 two times a day pre meals 11)  Lorazepam 0.5 Mg Tabs (Lorazepam) .... Take 1 tab q 6-8 hrs as needed 12)  Metformin Hcl 500 Mg Xr24h-tab (Metformin hcl) .[name].. 1 two times a day with 2 largest meals  Patient Instructions: 1)  Complete stool  cards.Avoid foods high in acid (tomatoes, citrus juices, spicy foods).  Avoid eating within two hours of lying down or before exercising. Do not over eat; try smaller more frequent meals. Elevate head of bed twelve inches when sleeping. Prescriptions: OMEPRAZOLE 20 MG CPDR (OMEPRAZOLE) 1 two times a day pre meals  #60 x 1   Entered and Authorized by:   Marga Melnick MD   Signed by:   Marga Melnick MD on 07/30/2010   Method used:   Print then Give to Patient   RxID:   [id number]

## 2011-01-13 NOTE — Assessment & Plan Note (Signed)
Summary: 3 WEEK FOLLOWUP///SPH   Vital Signs:  Patient profile:   60 year old female Weight:      185 pounds BMI:     30.43 Pulse rate:   80 / minute Pulse rhythm:   l Resp:     15 per minute BP sitting:   130 / 78  (left arm) Cuff size:   large  Vitals Entered By: Shonna Chock (Apr 17, 2010 9:00 AM) CC: 3 Week follow-up on meds  Comments REVIEWED MED LIST, PATIENT AGREED DOSE AND INSTRUCTION CORRECT    CC:  3 Week follow-up on meds .  History of Present Illness: As of last week she "feels more @ peace"; "I still cry but not overwhelmingly". She is reading materials on loss. A major stress is handling her son's disjointed estate with bills, etc. Lorazepam taken once daily - two times a day if needed. FBS in 140s.? hypoglycemia after increased carb intake.  Allergies: 1)  * Vioxx (Rofecoxib) 2)  Prednisone (Prednisone) 3)  Welchol (Colesevelam Hcl)  Review of Systems General:  Complains of fatigue; denies loss of appetite and sleep disorder. CV:  Denies palpitations. GI:  Denies diarrhea. Psych:  Complains of anxiety and easily tearful; denies easily angered, irritability, and panic attacks. Endo:  Denies excessive hunger, excessive thirst, and excessive urination.  Physical Exam  General:  alert, well-nourished, appropriate dress, normal appearance, and good hygiene.   Psych:  Oriented X3, memory intact for recent and remote, normally interactive, good eye contact, not anxious appearing, not depressed appearing, and not agitated.     Impression & Recommendations:  Problem # 1:  DEPRESSIVE DISORDER NOT ELSEWHERE CLASSIFIED (ICD-311) Situational due to son's death Her updated medication list for this problem includes:    Lorazepam 0.5 Mg Tabs (Lorazepam) .[name]... Take 1 tab q 6-8 hrs as needed  Problem # 2:  DIABETES MELLITUS, TYPE II, CONTROLLED, MILD (ICD-250.00)  Her updated medication list for this problem includes:    Aspirin Ec 81 Mg Tbec (Aspirin) .[name]... Take 1  tablet by mouth every morning    Lisinopril 20 Mg Tabs (Lisinopril) .[name]... Take 1 tablet by mouth once a day  Orders: Venipuncture (16109) TLB-Potassium (K+) (84132-K) TLB-Creatinine, Blood (82565-CREA) TLB-BUN (Urea Nitrogen) (84520-BUN) TLB-A1C / Hgb A1C (Glycohemoglobin) (83036-A1C) TLB-Microalbumin/Creat Ratio, Urine (82043-MALB)  Complete Medication List: 1)  Fish Oil 1000 Mg Caps (Omega-3 fatty acids) .... Take one tablet daily 2)  Aspirin Ec 81 Mg Tbec (Aspirin) .... Take 1 tablet by mouth every morning 3)  Lisinopril 20 Mg Tabs (Lisinopril) .... Take 1 tablet by mouth once a day 4)  Vytorin 10-20 Mg Tabs (Ezetimibe-simvastatin) .[name].. 1 at bedtime**labs due now** 5)  Tramadol Hcl 50 Mg Tabs (Tramadol hcl) .... Takje 1/2 to 1 every 6 hours 6)  Freestyle Lite Strp (Glucose blood) .... Use as directed 7)  Vitamin D  .... Takes 1 cc (8000 international units)  mon , weds , fri & sun 8)  Citalopram 40 Mg  .... Take one tablet daily 9)  Cyclobenzaprine Hcl 10 Mg Tabs (Cyclobenzaprine hcl) .[name].. 1 at bedtime as needed 10)  Omeprazole 20 Mg Cpdr (Omeprazole) .[name].. 1 q am 11)  Lorazepam 0.5 Mg Tabs (Lorazepam) .... Take 1 tab q 6-8 hrs as needed  Patient Instructions: 1)  Please call if you change your mind about seeing a Counselor. 2)  Check your blood sugars regularly. If your readings are usually above : 150 or below 90 you should contact our  office. 3)  See your eye doctor yearly to check for diabetic eye damage. 4)  Check your feet each night for sore areas, calluses or signs of infection.

## 2011-01-13 NOTE — Assessment & Plan Note (Signed)
Summary: CPX/FASTING//KN   Vital Signs:  Patient profile:   60 year old female Height:      65.75 inches Weight:      185.2 pounds BMI:     30.23 Temp:     98.5 degrees F oral Pulse rate:   72 / minute Resp:     14 per minute BP sitting:   110 / 72  (left arm) Cuff size:   regular  Vitals Entered By: Shonna Chock CMA (September 26, 2010 8:29 AM)    History of Present Illness:    Christine [name] is here for a physical; she continues to see Dr. Dorcas Carrow for grief  counselling.She has been unable to complete the paper work concerning  her son's estate. Type 2 Diabetes Mellitus Follow-Up      This is a 60 year old woman who also  presents for Type 2 diabetes mellitus follow-up.  The patient reports numbness of toes intermittently , but denies polyuria, polydipsia, blurred vision, self managed hypoglycemia, weight loss, and weight gain.  The patient denies the following symptoms: neuropathic pain, chest pain, vomiting, orthostatic symptoms, poor wound healing, intermittent claudication, vision loss, and foot ulcer.  Since the last visit the patient reports poor dietary compliance and not exercising regularly.  The patient has been measuring capillary blood glucose before breakfast ( 125-168) and at bedtime(110-130).  Since the last visit, the patient reports having had eye care by an Ophthalmologist.  Hyperlipidemia Follow-Up      The patient also presents for Hyperlipidemia follow-up.  The patient denies muscle aches, GI upset, abdominal pain, flushing, itching, constipation, and diarrhea.  The patient denies the following symptoms: dypsnea, palpitations, syncope, and pedal edema.  Compliance with medications (by patient report) has been near 100%.  Adjunctive measures currently used by the patient include ASA and fish oil supplements.   Hypertension Follow-Up      The patient also presents for Hypertension follow-up.  The patient denies lightheadedness and headaches.  Compliance with  medications (by patient report) has been near 100%.  Adjunctive measures currently used by the patient include salt restriction. BP usually 120/60-70.   Current Medications (verified): 1)  Fish Oil 1000 Mg Caps (Omega-3 Fatty Acids) .... Take One Tablet Daily 2)  Aspirin Ec 81 Mg Tbec (Aspirin) .... Take 1 Tablet By Mouth Every Morning 3)  Lisinopril 20 Mg Tabs (Lisinopril) .... Take 1 Tablet By Mouth Once A Day 4)  Vytorin 10-20 Mg Tabs (Ezetimibe-Simvastatin) .[name].. 1 At Bedtime**labs Due Now** 5)  Tramadol Hcl 50 Mg  Tabs (Tramadol Hcl) .... Takje 1/2 To 1 Every 6 Hours 6)  Freestyle Lite   Strp (Glucose Blood) .... Use As Directed 7)  Vitamin D .... Takes 1 Cc (8000 International Units)  Mon , Weds , Fri & Sun 8)  Citalopram  40 Mg .... Take One Tablet Daily 9)  Cyclobenzaprine Hcl 10 Mg Tabs (Cyclobenzaprine Hcl) .[name].. 1 At Bedtime As Needed 10)  Omeprazole 20 Mg Cpdr (Omeprazole) .[name].. 1 Two Times A Day Pre Meals 11)  Lorazepam 0.5 Mg Tabs (Lorazepam) .... Take 1 Tab Q 6-8 Hrs As Needed 12)  Metformin Hcl 500 Mg Xr24h-Tab (Metformin Hcl) .[name].. 1 Two Times A Day With 2 Largest Meals  Allergies: 1)  * Vioxx (Rofecoxib) 2)  Prednisone (Prednisone) 3)  Welchol (Colesevelam Hcl)  Past History:  Past Medical History: TRANSIENT ISCHEMIC ATTACKS, PMH  OF (ICD-V12.50)? due to Vioxx ENDOMETRIOSIS (ICD-617.9), PMH OF COLONIC POLYPS, HYPERPLASTIC (ICD-211.3) ALLERGIC RHINITIS  (  ICD-477.9) SLEEP DISORDER (ICD-780.50);No Sleep Apnea as per  Dr Marylou Flesher UNS ADVRS EFF UNS RX MEDICINAL&BIOLOGICAL SBSTNC (ICD-995.20) ARTHRALGIA (ICD-719.40); DrZieminski UNSPECIFIED MYALGIA AND MYOSITIS/ FIBROMYALGIA (ICD-729.1) FATIGUE,CHRONIC (ICD-780.79) ANAL FISSURE (ICD-565.0), PMH OF FATIGUE, CHRONIC (ICD-780.79) MIGRAINE HEADACHE (ICD-346.90), QUIESCENT HYPERTENSION (ICD-401.9) HYPERLIPIDEMIA (ICD-272.4)  Past Surgical History: Colon polypectomy 2002; benign hyperplastic  polyp; rectal bleeding  2008,colonoscopy negative  except for Internal  Hemorrhoids Total Abdominal Hysterectomy/BSO for Endometriosis 1982 G2 P2 ; Catheterization  1997  for chest pain : negative Tonsillectomy  Family History: M aunt :colon cancer; P uncle: DM Father: MI @ 64 Mother: COAD Siblings: bro: MI @ 51; bro: MIs onset  in 71s,  lung cancer,CBAG;niece: committed suicide  Social History: Occupation:Data Animator Married Never Smoked Alcohol use-no Regular exercise-no  Review of Systems       The patient complains of anorexia.  The patient denies fever, decreased hearing, hoarseness, hemoptysis, melena, hematochezia, severe indigestion/heartburn, hematuria, suspicious skin lesions, unusual weight change, abnormal bleeding, enlarged lymph nodes, and angioedema.         Occasional pill dysphagia , ? from dry mouth. Dry cough for several weeks. Psych:  Complains of anxiety, depression, easily tearful, and panic attacks; denies easily angered and irritability.  Physical Exam  General:  well-nourished; alert,appropriate and cooperative throughout examination Head:  Normocephalic and atraumatic without obvious abnormalities.  Eyes:  No corneal or conjunctival inflammation noted. Perrla. Funduscopic exam benign, without hemorrhages, exudates or papilledema. Ears:  External ear exam shows no significant lesions or deformities.  Otoscopic examination reveals clear canals, tympanic membranes are intact bilaterally without bulging, retraction, inflammation or discharge. Hearing is grossly normal bilaterally. Nose:  External nasal examination shows no deformity or inflammation. Nasal mucosa are pink and moist without lesions or exudates. Mouth:  Oral mucosa and oropharynx without lesions or exudates.  Teeth in good repair. Neck:  No deformities, masses, or tenderness noted. Lungs:  Normal respiratory effort, chest expands symmetrically. Lungs are clear to auscultation, no crackles or  wheezes. Heart:  Normal rate and regular rhythm. S1 and S2 normal without gallop, murmur, click, rub.S4 Abdomen:  Bowel sounds positive,abdomen soft and non-tender without masses, organomegaly or hernias noted. Msk:  No deformity or scoliosis noted of thoracic or lumbar spine.   Pulses:  R and L carotid,radial,dorsalis pedis and posterior tibial pulses are full and equal bilaterally Extremities:  No clubbing, cyanosis, edema, or deformity noted with normal full range of motion of all joints.  Good nail health Neurologic:  alert & oriented X3, sensation intact to light touch over feet, and DTRs symmetrical and normal.   Skin:  Intact without suspicious lesions or rashes Cervical Nodes:  No lymphadenopathy noted Axillary Nodes:  No palpable lymphadenopathy Psych:  depressed affect and subdued.  Intermittently tearful.   Impression & Recommendations:  Problem # 1:  ROUTINE GENERAL MEDICAL EXAM@HEALTH  CARE FACL (ICD-V70.0)  Orders: Venipuncture (16109) TLB-Lipid Panel (80061-LIPID) TLB-BMP (Basic Metabolic Panel-BMET) (80048-METABOL) TLB-CBC Platelet - w/Differential (85025-CBCD) TLB-Hepatic/Liver Function Pnl (80076-HEPATIC) TLB-TSH (Thyroid Stimulating Hormone) (84443-TSH) TLB-A1C / Hgb A1C (Glycohemoglobin) (83036-A1C) TLB-Microalbumin/Creat Ratio, Urine (82043-MALB)  Problem # 2:  COUGH (ICD-786.2) ? from ACE-I  Problem # 3:  DRY MOUTH (ICD-527.7) ? iatrogenic  Problem # 4:  DIABETES MELLITUS, TYPE II, CONTROLLED, MILD (ICD-250.00)  The following medications were removed from the medication list:    Lisinopril 20 Mg Tabs (Lisinopril) .[name]... Take 1 tablet by mouth once a day Her updated medication list for this problem includes:    Aspirin Ec  81 Mg Tbec (Aspirin) .[name]... Take 1 tablet by mouth every morning    Metformin Hcl 500 Mg Xr24h-tab (Metformin hcl) .[name]... 1 two times a day with 2 largest meals    Losartan Potassium 100 Mg Tabs (Losartan potassium) .[name]... 1 once daily in  place of lisinopril  Problem # 5:  HYPERTENSION, ESSENTIAL NOS (ICD-401.9)  The following medications were removed from the medication list:    Lisinopril 20 Mg Tabs (Lisinopril) .[name]... Take 1 tablet by mouth once a day Her updated medication list for this problem includes:    Losartan Potassium 100 Mg Tabs (Losartan potassium) .[name]... 1 once daily in place of lisinopril  Problem # 6:  HYPERLIPIDEMIA (ICD-272.4)  Her updated medication list for this problem includes:    Vytorin 10-20 Mg Tabs (Ezetimibe-simvastatin) .[name]... 1 at bedtime  Problem # 7:  DEPRESSIVE DISORDER NOT ELSEWHERE CLASSIFIED (ICD-311)  Her updated medication list for this problem includes:    Lorazepam 0.5 Mg Tabs (Lorazepam) .[name]... Take 1 tab q 6-8 hrs as needed    Cymbalta 60 Mg Cpep (Duloxetine hcl) .[name]... 1 once daily in am  Complete Medication List: 1)  Fish Oil 1000 Mg Caps (Omega-3 fatty acids) .... Take one tablet daily 2)  Aspirin Ec 81 Mg Tbec (Aspirin) .... Take 1 tablet by mouth every morning 3)  Vytorin 10-20 Mg Tabs (Ezetimibe-simvastatin) .[name].. 1 at bedtime 4)  Tramadol Hcl 50 Mg Tabs (Tramadol hcl) .... Takje 1/2 to 1 every 6 hours 5)  Onetouch Ultra Blue Strp (Glucose blood) .... Check bloodsugar one x daily 6)  Vitamin D  .... Takes 1 cc (8000 international units)  mon , weds , fri & sun 7)  Citalopram 40 Mg  .... 1/2 pill daily x 5 then d/c; then start cymbalta next day 8)  Cyclobenzaprine Hcl 10 Mg Tabs (Cyclobenzaprine hcl) .[name].. 1 at bedtime as needed 9)  Omeprazole 20 Mg Cpdr (Omeprazole) .[name].. 1 two times a day pre meals 10)  Lorazepam 0.5 Mg Tabs (Lorazepam) .... Take 1 tab q 6-8 hrs as needed 11)  Metformin Hcl 500 Mg Xr24h-tab (Metformin hcl) .[name].. 1 two times a day with 2 largest meals 12)  Cymbalta 60 Mg Cpep (Duloxetine hcl) .[name].. 1 once daily in am 13)  Losartan Potassium 100 Mg Tabs (Losartan potassium) .[name].. 1 once daily in place of lisinopril 14)  Vitamin B-12 250 Mcg Tabs (Cyanocobalamin)  .[name].. 1 by mouth once daily 15)  Vitamin D3 50000 Unit Caps (Cholecalciferol) .[name].. 1 by mouth weekly  Other Orders: Admin 1st Vaccine (98119) Flu Vaccine 68yrs + (14782)  Patient Instructions: 1)  Schedule your mammogram. 2)  Check your blood sugars regularly. If your readings are usually above : 150 or below 90 you should contact our office. 3)  See your eye doctor yearly to check for diabetic eye damage. 4)  Check your feet each night for sore areas, calluses or signs of infection. 5)  Check your Blood Pressure regularly. If it is above:135/85 ON AVERAGE  you should make an appointment. 6)  It is important that you exercise regularly at least 20 minutes 5 times a week. If you develop chest pain, have severe difficulty breathing, or feel very tired , stop exercising immediately and seek medical attention. Prescriptions: TRAMADOL HCL 50 MG  TABS (TRAMADOL HCL) takje 1/2 to 1 every 6 hours  #50 Tablet x 3   Entered by:   Shonna Chock CMA   Authorized by:   Marga Melnick MD  Signed by:   Shonna Chock CMA on 09/26/2010   Method used:   Print then Give to Patient   RxID:   [id number] CYCLOBENZAPRINE HCL 10 MG TABS (CYCLOBENZAPRINE HCL) 1 at bedtime as needed  #30 Each x 11   Entered by:   Shonna Chock CMA   Authorized by:   Marga Melnick MD   Signed by:   Shonna Chock CMA on 09/26/2010   Method used:   Print then Give to Patient   RxID:   [id number] ONETOUCH ULTRA BLUE  STRP (GLUCOSE BLOOD) check bloodsugar one x daily  #100 x 3   Entered by:   Shonna Chock CMA   Authorized by:   Marga Melnick MD   Signed by:   Shonna Chock CMA on 09/26/2010   Method used:   Faxed to ...       [id number]       [address]       [place], [place]  [address]       Ph: [phone] or [phone]       Fax: [phone]   RxID:   [id number] METFORMIN HCL 500 MG XR24H-TAB (METFORMIN HCL) 1 two times a day WITH 2 largest meals  #180 x 1    Entered and Authorized by:   Marga Melnick MD   Signed by:   Marga Melnick MD on 09/26/2010   Method used:   Print then Give to Patient   RxID:   [id number] LORAZEPAM 0.5 MG TABS (LORAZEPAM) Take 1 tab q 6-8 hrs as needed  #30 x 5   Entered and Authorized by:   Marga Melnick MD   Signed by:   Marga Melnick MD on 09/26/2010   Method used:   Print then Give to Patient   RxID:   [id number] OMEPRAZOLE 20 MG CPDR (OMEPRAZOLE) 1 two times a day pre meals  #60 x 5   Entered and Authorized by:   Marga Melnick MD   Signed by:   Marga Melnick MD on 09/26/2010   Method used:   Print then Give to Patient   RxID:   [id number] VYTORIN 10-20 MG TABS (EZETIMIBE-SIMVASTATIN) 1 at bedtime  #90 x 3   Entered and Authorized by:   Marga Melnick MD   Signed by:   Marga Melnick MD on 09/26/2010   Method used:   Print then Give to Patient   RxID:   [id number] LOSARTAN POTASSIUM 100 MG TABS (LOSARTAN POTASSIUM) 1 once daily in place of Lisinopril  #30 x 5   Entered and Authorized by:   Marga Melnick MD   Signed by:   Marga Melnick MD on 09/26/2010   Method used:   Print then Give to Patient   RxID:   [id number] CYMBALTA 60 MG CPEP (DULOXETINE HCL) 1 once daily in am  #30 x 5   Entered and Authorized by:   Marga Melnick MD   Signed by:   Marga Melnick MD on 09/26/2010   Method used:   Print then Give to Patient   RxID:   [id number]  Flu Vaccine Consent Questions     Do you have a history of severe allergic reactions to this vaccine? no    Any prior history of allergic reactions to egg and/or gelatin? no    Do you have a sensitivity to the preservative Thimersol? no    Do you have a past history of Guillan-Barre Syndrome?  no    Do you currently have an acute febrile illness? no    Have you ever had a severe reaction to latex? no    Vaccine information given and explained to patient? yes    Are you currently pregnant? no    Lot Number:[id number]   Exp  Date:06/13/2011   Site Given  Right Deltoid IMd by:   Marga Melnick MD on 09/26/2010   Method used:   Print then Give to Patient   RxID:   [id number]    .lbflu

## 2011-01-13 NOTE — Progress Notes (Signed)
Summary: Medication Concerns/? Name of [facility]  Phone Note Call from Patient Call back at Work Phone [phone]   Caller: Patient Summary of Call: Message left on VM: Patient had a couple of questions please call.  1.) Patient is scared to take Metformin, patient would like to know if she could start out taking it once daily? 2.) Name of recommended Counselor, patient would like to see one at this time? Initial call taken by: Shonna Chock,  May 13, 2010 4:01 PM  Follow-up for Phone Call        Check A1c after 8 weeks of Metformin once daily (250.00). See referral Follow-up by: Marga Melnick MD,  May 13, 2010 5:11 PM  Additional Follow-up for Phone Call Additional follow up Details #1::        pt informed od Dr Caryl Never recommendations and lab scheduled. Kandice Hams  May 14, 2010 9:51 AM  Additional Follow-up by: Kandice Hams,  May 14, 2010 9:51 AM

## 2011-01-13 NOTE — Progress Notes (Signed)
Summary: REFILL  Phone Note Refill Request Message from:  Fax from Pharmacy on March 25, 2010 2:27 PM  Refills Requested: Medication #1:  LORAZEPAM 0.5 MG TABS Take 1 tab q 6-8 hrs as needed. [facility] [address] FA[facility] [phone]   Method Requested: Fax to Local Pharmacy Next Appointment Scheduled: NO APPT Initial call taken by: Barb Merino,  March 25, 2010 2:27 PM    Prescriptions: LORAZEPAM 0.5 MG TABS (LORAZEPAM) Take 1 tab q 6-8 hrs as needed  #30 x 0   Entered by:   Shonna Chock   Authorized by:   Marga Melnick MD   Signed by:   Shonna Chock on 03/25/2010   Method used:   Printed then faxed to ...       [facility] [id number]       [address]       [place], [place]  [address]       Ph: [phone] or [phone]       Fax: [phone]   RxID:   [id number]

## 2011-01-13 NOTE — Consult Note (Signed)
Summary: [facility]   Imported By: Lanelle Bal 07/10/2010 12:07:04  _____________________________________________________________________  External Attachment:    Type:   Image     Comment:   External Document

## 2011-01-16 NOTE — Letter (Signed)
Summary: Marlaine Hind MD Optometrist  Marlaine Hind MD Optometrist   Imported By: Lanelle Bal 07/23/2010 08:24:12  _____________________________________________________________________  External Attachment:    Type:   Image     Comment:   External Document

## 2011-03-12 ENCOUNTER — Encounter: Payer: Self-pay | Admitting: Internal Medicine

## 2011-03-13 ENCOUNTER — Other Ambulatory Visit: Payer: Self-pay | Admitting: Internal Medicine

## 2011-03-13 ENCOUNTER — Other Ambulatory Visit: Payer: Self-pay

## 2011-03-13 ENCOUNTER — Ambulatory Visit (INDEPENDENT_AMBULATORY_CARE_PROVIDER_SITE_OTHER): Payer: 59 | Admitting: Internal Medicine

## 2011-03-13 ENCOUNTER — Encounter: Payer: Self-pay | Admitting: Internal Medicine

## 2011-03-13 VITALS — BP 138/90 | HR 96 | Wt 185.6 lb

## 2011-03-13 DIAGNOSIS — IMO0001 Reserved for inherently not codable concepts without codable children: Secondary | ICD-10-CM

## 2011-03-13 DIAGNOSIS — R000 Tachycardia, unspecified: Secondary | ICD-10-CM

## 2011-03-13 DIAGNOSIS — F411 Generalized anxiety disorder: Secondary | ICD-10-CM

## 2011-03-13 LAB — BASIC METABOLIC PANEL
BUN: 15 mg/dL (ref 6–23)
Chloride: 102 mEq/L (ref 96–112)
Creat: 0.62 mg/dL (ref 0.40–1.20)
Glucose, Bld: 139 mg/dL — ABNORMAL HIGH (ref 70–99)
Potassium: 4.1 mEq/L (ref 3.5–5.3)

## 2011-03-13 LAB — HEMOGLOBIN A1C
Hgb A1c MFr Bld: 7.7 % — ABNORMAL HIGH (ref <5.7)
Mean Plasma Glucose: 174 mg/dL — ABNORMAL HIGH (ref <117)

## 2011-03-13 LAB — LIPID PANEL
LDL Cholesterol: 88 mg/dL (ref 0–99)
Triglycerides: 133 mg/dL (ref <150)
VLDL: 27 mg/dL (ref 0–40)

## 2011-03-13 MED ORDER — LORAZEPAM 0.5 MG PO TABS: 0.5000 mg | ORAL_TABLET | Freq: Three times a day (TID) | ORAL | Status: AC | PRN

## 2011-03-13 MED ORDER — GLUCOSE BLOOD VI STRP: ORAL_STRIP | Status: DC

## 2011-03-13 NOTE — Progress Notes (Signed)
Addended by: Floydene Flock on: 03/13/2011 05:19 PM   Modules accepted: Orders

## 2011-03-13 NOTE — Progress Notes (Signed)
Subjective:    Patient ID: Christine [name], female    DOB: 05-Oct-1951, 60 y.o.   MRN: [id number]  Palpitations  This is a new problem. Episode onset: onset 10 days ago w/o triggers such as decongestants; stress is chronic issue. The problem has been gradually improving. Associated symptoms include anxiety, diaphoresis and nausea. Pertinent negatives include no chest fullness, chest pain, coughing, dizziness, fever, irregular heartbeat, malaise/fatigue, near-syncope, numbness, shortness of breath, vomiting or weakness. She has tried nothing for the symptoms. Risk factors include diabetes mellitus, family history, stress and sedentary lifestyle (Her father and 2 paternal uncles had heart attacks in their 44s. One brother had a heart attack at 53 and another brother has coronary disease.). Her past medical history is significant for anxiety.      Review of Systems  Constitutional: Positive for diaphoresis. Negative for fever and malaise/fatigue.       She is intolerance to heat. She questions whether the Cymbalta is causing sweating.  Eyes: Negative for visual disturbance.  Respiratory: Negative for cough and shortness of breath.   Cardiovascular: Positive for palpitations. Negative for chest pain and near-syncope.  Gastrointestinal: Positive for nausea. Negative for vomiting, diarrhea and constipation.       30 minutes after eating she does notice some "pulling" in the right upper quadrant on occasion. She denies any melena or rectal bleeding.  Neurological: Negative for dizziness, weakness and numbness.  Psychiatric/Behavioral:       She admits to being depressed; she was released by her counselor but she plans to make a followup appointment. She has not been taking the rest of the hand for fear of addiction. She believes that the fluttering is related to anxiety.    This office visit actually was to follow up on her diabetes. Her A1c in December was 7.2 %  indicating poor diabetic control.  She did not come in as instructed ; she  states that she was "too depressed" to take care of herself. "There were times that evening and care". At home her fasting blood sugars range 118-158. She denies any hypoglycemia. She was asked to come in before refilling the metformin. The cardiovascular risk of uncontrolled diabetes was discussed with her. This is  particularly poignant in view of the family history. Additionally as she is postmenopausal she now has same CV risk as   the males in the  family.     Objective:   Physical Exam  Constitutional: She is oriented to person, place, and time. She appears well-nourished.  Eyes: EOM are normal. Pupils are equal, round, and reactive to light. No scleral icterus.       No lid lag is noted.  Neck: No thyromegaly present.  Cardiovascular: Exam reveals gallop.        She exhibits an S4 with slight slurring. There is  no significant murmur  Pulmonary/Chest: Effort normal and breath sounds normal. She has no wheezes.  Abdominal: Soft. She exhibits no distension and no mass. There is no tenderness. There is no rebound and no guarding.  Lymphadenopathy:    She has no cervical adenopathy.  Neurological: She is oriented to person, place, and time. She has normal reflexes.       Very fine tremor is noted of the hands, greater on the right.  Skin: Skin is warm and dry. She is not diaphoretic.       No onycholysis this is present of the nails.  Psychiatric: Her behavior is normal.  She does not appear anxious; in fact affect is slightly flat.          Assessment & Plan:

## 2011-03-13 NOTE — Patient Instructions (Signed)
Please avoid excess stimulants particularly caffeine (coffee, tea, cola chocolate). If you're having anxiety please take the lorazepam every 8 hours as needed. It is low dose in addition potential is low his take on an as needed basis. Please followup with your counselor as soon as possible.

## 2011-03-14 LAB — T4, FREE: Free T4: 0.91 ng/dL (ref 0.80–1.80)

## 2011-03-14 LAB — TSH: TSH: 1.72 u[IU]/mL (ref 0.350–4.500)

## 2011-03-24 ENCOUNTER — Other Ambulatory Visit: Payer: Self-pay | Admitting: Internal Medicine

## 2011-03-25 LAB — POCT HEMOGLOBIN-HEMACUE: Hemoglobin: 12.9 g/dL (ref 12.0–15.0)

## 2011-03-25 LAB — BASIC METABOLIC PANEL
Chloride: 103 mEq/L (ref 96–112)
GFR calc Af Amer: >60 mL/min (ref >60)
GFR calc non Af Amer: >60 mL/min (ref >60)
Potassium: 4.1 mEq/L (ref 3.5–5.1)

## 2011-03-25 LAB — GLUCOSE, CAPILLARY: Glucose-Capillary: 127 mg/dL — ABNORMAL HIGH (ref 70–99)

## 2011-04-15 ENCOUNTER — Ambulatory Visit (INDEPENDENT_AMBULATORY_CARE_PROVIDER_SITE_OTHER)
Admission: RE | Admit: 2011-04-15 | Discharge: 2011-04-15 | Disposition: A | Discharge status: Home / Self Care | Payer: 59 | Source: Ambulatory Visit | Attending: Internal Medicine | Admitting: Internal Medicine

## 2011-04-15 ENCOUNTER — Ambulatory Visit (INDEPENDENT_AMBULATORY_CARE_PROVIDER_SITE_OTHER): Payer: 59 | Admitting: Internal Medicine

## 2011-04-15 ENCOUNTER — Encounter: Payer: Self-pay | Admitting: Internal Medicine

## 2011-04-15 VITALS — BP 124/78 | HR 106 | Wt 184.6 lb

## 2011-04-15 DIAGNOSIS — R002 Palpitations: Secondary | ICD-10-CM

## 2011-04-15 HISTORY — DX: Palpitations: R00.2

## 2011-04-15 LAB — D-DIMER, QUANTITATIVE: D-Dimer, Quant: 0.22 ug/mL-FEU (ref 0.00–0.48)

## 2011-04-15 NOTE — Assessment & Plan Note (Addendum)
Patient complains of palpitations for several weeks. Review of systems is essentially negative except for depression and an ill-defined discomfort at the left side of the chest that started about 24 hours ago. EKG today shows sinus tachycardia without ischemic changes. Recent blood work negative except for uncontrolled diabetes. Plan: CBC, d-dimer, chest x-ray. I will ask cardiology to see her this week, I can't explain her tachycardia.  These all could be anxiety related but again I think she needs to see cardiology, likely will need a ECHO-holter monitor .

## 2011-04-15 NOTE — Patient Instructions (Signed)
Please get a chest x-ray Go to the ER or call if symptoms of severe, increased chest pain I will ask cardiology to see you

## 2011-04-15 NOTE — Progress Notes (Signed)
  Subjective:    Patient ID: Christine [name], female    DOB: 09-Apr-1951, 59 y.o.   MRN: [id number]  HPI Patient is complaining of palpitations described as rapid heartbeat, occasionally it feels irregular and she feels a missing beat. Symptoms  started around 03-03-11, she was evaluated by her primary care doctor on 03-13-11. Labs were normal except for a elevated hemoglobin A1c. Her symptoms are not any better, she has palpitations 24/7. Sometimes she is awake at night by them. There is no associated symptoms although today she has developed a steady ache at the L lateral aspect of the chest. Patient reports a negative cardiac catheterization in 1997 for chest pain  Past Medical History  Diagnosis Date  . Allergic rhinitis   . Sleep disorder   . Diabetes mellitus   . Migraine headache     quiescent  . TIA (transient ischemic attack)   . Hyperlipidemia   . HTN (hypertension)    Past Surgical History  Procedure Date  . Polypectomy 2002    benign, hyperplastic polyp; rectal bleeding 2008  . Total abdominal hysterectomy 1982    BSO for endometriosis  . Coronary angioplasty 1997    for chest pain- negative   . Tonsillectomy      Review of Systems No substernal chest pain, no shortness of breath. No pre-syncope feeling. No lower extremity edema, not recent airplane trips or prolonged car trips. As far as anxiety, she lost her son a year ago, obviously still dealing with her loss. She started Cymbalta about a year ago and takes Ativan when necessary. If anything she is more depressed than anxious. Denies taking any over-the-counter meds ,weight loss tablets, herbal medicines. No nausea, vomiting, diarrhea. No vaginal bleeding. Her A1c was elevated, meds were changed, CBGs are definitely better. No recent low blood sugars.    Objective:   Physical Exam  Constitutional: She is oriented to person, place, and time. She appears well-developed and well-nourished.  Neck: No  thyromegaly present.       Normal carotid pulses  Cardiovascular:       Slightly tachycardic but regular. No murmur  Pulmonary/Chest: Effort normal and breath sounds normal. No respiratory distress. She has no wheezes. She has no rales.  Abdominal: Soft. Bowel sounds are normal. She exhibits no distension. There is no tenderness. There is no rebound.  Musculoskeletal:       No edema, calves are symmetric and nontender to palpation  Neurological: She is alert and oriented to person, place, and time.  Psychiatric:        No overtly anxious but definitely gets emotional when she talks about her son.          Assessment & Plan:

## 2011-04-16 ENCOUNTER — Encounter: Payer: Self-pay | Admitting: Internal Medicine

## 2011-04-16 LAB — CBC WITH DIFFERENTIAL/PLATELET
Basophils Absolute: 0 10*3/uL (ref 0.0–0.1)
Eosinophils Relative: 3 % (ref 0.0–5.0)
HCT: 37.3 % (ref 36.0–46.0)
Hemoglobin: 12.7 g/dL (ref 12.0–15.0)
Lymphocytes Relative: 65.2 % — ABNORMAL HIGH (ref 12.0–46.0)
Lymphs Abs: 2 10*3/uL (ref 0.7–4.0)
Monocytes Relative: 16.8 % — ABNORMAL HIGH (ref 3.0–12.0)
Neutro Abs: 0.4 10*3/uL — ABNORMAL LOW (ref 1.4–7.7)
Platelets: 181 10*3/uL (ref 150.0–400.0)
RDW: 13.1 % (ref 11.5–14.6)
WBC: 3 10*3/uL — ABNORMAL LOW (ref 4.5–10.5)

## 2011-04-16 LAB — MAGNESIUM: Magnesium: 2.1 mg/dL (ref 1.5–2.5)

## 2011-04-20 ENCOUNTER — Telehealth: Payer: Self-pay | Admitting: *Deleted

## 2011-04-20 NOTE — Telephone Encounter (Signed)
Message left for patient to return my call.  

## 2011-04-20 NOTE — Telephone Encounter (Addendum)
Pt aware of labs questions about WBC saw Dr. Drue Novel 5/2 informed blood slightly abnormal and would like to know if she should be concerned.

## 2011-04-20 NOTE — Telephone Encounter (Signed)
Message copied by Army Fossa on Mon Apr 20, 2011 10:41 AM ------      Message from: Christine [name]      Created: Sat Apr 18, 2011  1:43 PM       Advised patient.       All labs okay. Her CBC is a slightly abnormal, please see her PCP in 3 months to get that repeated.

## 2011-04-20 NOTE — Telephone Encounter (Signed)
The low white count and increased lymphocytes usually suggest a recent viral process. A followup CBC and differential should be completed in 6-8 weeks to verify resolution of this pattern.

## 2011-04-21 ENCOUNTER — Encounter (INDEPENDENT_AMBULATORY_CARE_PROVIDER_SITE_OTHER): Payer: 59

## 2011-04-21 DIAGNOSIS — I471 Supraventricular tachycardia: Secondary | ICD-10-CM

## 2011-04-21 NOTE — Telephone Encounter (Signed)
Left msg for pt to return call.

## 2011-04-22 ENCOUNTER — Other Ambulatory Visit: Payer: Self-pay

## 2011-04-22 MED ORDER — DULOXETINE HCL 60 MG PO CPEP: 60.0000 mg | ORAL_CAPSULE | ORAL | Status: DC

## 2011-04-22 NOTE — Telephone Encounter (Signed)
Spoke w/ pt aware of information.  

## 2011-04-23 ENCOUNTER — Ambulatory Visit (INDEPENDENT_AMBULATORY_CARE_PROVIDER_SITE_OTHER): Payer: 59 | Admitting: Cardiovascular Disease

## 2011-04-23 ENCOUNTER — Encounter: Payer: Self-pay | Admitting: Cardiovascular Disease

## 2011-04-23 VITALS — BP 112/78 | HR 102 | Resp 12 | Ht 65.0 in | Wt 188.0 lb

## 2011-04-23 DIAGNOSIS — I10 Essential (primary) hypertension: Secondary | ICD-10-CM

## 2011-04-23 DIAGNOSIS — E785 Hyperlipidemia, unspecified: Secondary | ICD-10-CM

## 2011-04-23 DIAGNOSIS — R002 Palpitations: Secondary | ICD-10-CM

## 2011-04-23 MED ORDER — PROPRANOLOL HCL 10 MG PO TABS: ORAL_TABLET | ORAL | Status: DC

## 2011-04-23 NOTE — Progress Notes (Signed)
60 yo with palpitations worse over last 2 months.  Referred by Dr Alwyn Ren.  Significant grief and depression over sons unexpected death this past year.  Seeing a counselor and has tid ativan.  Palpitations related to anxiety.  No associated dyspnea or SSCP.  No syncope.  Had cath in ? 96 at [facility] long that was normal.  CRF;s include DM, HTN and elevated lipids all well treated.  Exercise does not make palpitations worse.  No associated diaphoresis or nausea.  REviewed holter done 5/8.  Only NSR with occasional PAC;s.    ROS: Denies fever, malais, weight loss, blurry vision, decreased visual acuity, cough, sputum, SOB, hemoptysis, pleuritic pain, palpitaitons, heartburn, abdominal pain, melena, lower extremity edema, claudication, or rash.   General: Affect appropriate Healthy:  appears stated age HEENT: normal Neck supple with no adenopathy JVP normal no bruits no thyromegaly Lungs clear with no wheezing and good diaphragmatic motion Heart:  S1/S2 systolic  Murmur no ,rub, gallop or click PMI normal Abdomen: benighn, BS positve, no tenderness, no AAA no bruit.  No HSM or HJR Distal pulses intact with no bruits No edema Neuro non-focal Skin warm and dry No muscular weakness  Medications Current Outpatient Prescriptions  Medication Sig Dispense Refill  . aspirin 81 MG tablet Take 81 mg by mouth daily.        . Cholecalciferol (VITAMIN D-3) 5000 UNITS TABS 7 tabs po qd....only on mondays pt takes 2 tabs       . Cholecalciferol (VITAMIN D3) 5000 UNITS CAPS Take 5,000 Units by mouth daily.        . cyclobenzaprine (FLEXERIL) 10 MG tablet Take 10 mg by mouth at bedtime as needed.        . DULoxetine (CYMBALTA) 60 MG capsule Take 1 capsule (60 mg total) by mouth every morning.  30 capsule  6  . ezetimibe-simvastatin (VYTORIN) 10-20 MG per tablet Take 1 tablet by mouth at bedtime.        . fish oil-omega-3 fatty acids 1000 MG capsule Take 2 g by mouth daily.        [name] glucose blood (ONE TOUCH  ULTRA TEST) test strip PRN  100 each  3  . LORazepam (ATIVAN) 0.5 MG tablet Take 0.5 mg by mouth 2 (two) times daily as needed.        [name] losartan (COZAAR) 100 MG tablet TAKE 1 TABLET BY MOUTH EVERY DAY  30 tablet  5  . omeprazole (PRILOSEC) 20 MG capsule Take 20 mg by mouth 2 (two) times daily before a meal.       . sitaGLIPtan-metformin (JANUMET) 50-500 MG per tablet Take 1 tablet by mouth 2 (two) times daily with a meal.        . traMADol (ULTRAM) 50 MG tablet Take 1/2 to 1 every 6 hours.       . vitamin B-12 (CYANOCOBALAMIN) 250 MCG tablet Take 250 mcg by mouth daily.        . propranolol (INDERAL) 10 MG tablet One tablet three times daily as needed for palpitations  30 tablet  6  . DISCONTD: Cholecalciferol (VITAMIN D PO) Take by mouth. Takes 1 cc (8000 internation units) Mon, Weds, Fri, Sun        Allergies Vioxx; Colesevelam; and Prednisone  Family History: Family History  Problem Relation Age of Onset  . Colon cancer Maternal Aunt   . Diabetes Paternal Uncle   . Heart attack Father 87    F at age 30, B  at 50, B s/p CABG  . COPD Mother   . Lung cancer Brother   . Sudden death      niece, committed suicide.    Social History: History   Social History  . Marital Status: Married    Spouse Name: N/A    Number of Children: N/A  . Years of Education: N/A   Occupational History  . Data Compensation Specialist     Social History Main Topics  . Smoking status: Never Smoker   . Smokeless tobacco: Not on file  . Alcohol Use: No  . Drug Use: No  . Sexually Active: Not on file   Other Topics Concern  . Not on file   Social History Narrative   Regular exercise- no     Electrocardiogram:  Assessment and Plan

## 2011-04-23 NOTE — Patient Instructions (Signed)
Your physician has requested that you have a stress echocardiogram. For further information please visit [email or web address] Please follow instruction sheet as given.   INDERAL 10MG  ONE TABLET THREE TIMES DAILY AS NEEDED FOR PALPITATIONS

## 2011-04-23 NOTE — Assessment & Plan Note (Signed)
Cholesterol is at goal.  Continue current dose of statin and diet Rx.  No myalgias or side effects.  F/U  LFT's in 6 months. Lab Results  Component Value Date   LDLCALC 88 03/13/2011

## 2011-04-23 NOTE — Assessment & Plan Note (Signed)
Likely benign and related to stress, anxiety and depression from sons death.  Multiple CRF;s and persistant.  F/U stress echo.  Has benign AV sclerosis murmur that can also be looked at with stress echo PRN Inderal for symptoms and encouraged her to take ativan as well

## 2011-04-23 NOTE — Assessment & Plan Note (Signed)
Well controlled.  Continue current medications and low sodium Dash type diet.    

## 2011-04-28 NOTE — Op Note (Signed)
NAMELACHLAN, PELTO NO.:  [id number]   MEDICAL RECORD NO.:  [id number]          PATIENT TYPE:  AMB   LOCATION:  DSC                          FACILITY:  [facility]   PHYSICIAN:  Almond Lint, MD       DATE OF BIRTH:  1951/08/18   DATE OF PROCEDURE:  03/29/2009  DATE OF DISCHARGE:                               OPERATIVE REPORT   PREOPERATIVE DIAGNOSIS:  Left buttock mass.   POSTOPERATIVE DIAGNOSIS:  Left buttock mass.   PROCEDURE PERFORMED:  Excision of the left buttock mass.   SURGEON:  Almond Lint, MD   ASSISTANT:  None.   ANESTHESIA:  MAC and local.   FINDINGS:  1.5-cm firm left buttock mass.   SPECIMEN:  Left buttock mass to Pathology.   EBL:  Minimal.   COMPLICATIONS:  None.   PROCEDURES:  Ms. Emrich was identified in the holding area and taken  to the operating room where she was placed supine on the operating room  table.  MAC anesthesia was induced.  She was turned laterally and her  left buttock was prepped and draped in a sterile fashion.  The time-out  was performed according to the surgical safety check list.  When all was  correct, we continued.  A transversely oriented 3-cm incision was made  overlying the mass.  Flaps were created with the Bovie and skin hooks.  The mass was then elevated out of the wound with an Allis clamp.  The  Bovie was used to dissect the subcutaneous tissue around the mass.  This  went all the way down to the surface of the gluteus maximus fascia.  It  was adherent to the fascia.  The [employer] retractors were used to  retract while the tissue underneath the mass was divided.  The area was  irrigated and hemostasis was achieved with Bovie electrocautery.  The  deep tissues were reapproximated using interrupted 3-0 Vicryl sutures  and then the skin was reapproximated using interrupted 3-0 Vicryl in a  deep dermal fashion and 4-0 Monocryl in a subcuticular fashion.  The  wound was cleaned, dried, and dressed with  Dermabond.  The patient was  awakened from anesthesia and taken to PACU in stable condition.     Almond Lint, MD  Electronically Signed    FB/MEDQ  D:  03/29/2009  T:  03/30/2009  Job:  [id number]

## 2011-04-28 NOTE — Assessment & Plan Note (Signed)
[facility]                         GASTROENTEROLOGY OFFICE NOTE   Christine [name], Christine [name]                    MRN:          [id number]  DATE:10/24/2007                            DOB:          1951-05-04    REFERRING PHYSICIAN:  Titus Dubin. Alwyn Ren, MD,FACP,FCCP   PROBLEM:  Rectal bleeding.   HISTORY:  Christine [name] is a pleasant 60 year old white female known to Dr.  Leone Payor with history of colon polyps.  She last underwent colonoscopy in  January 2004 for routine screening.  She does have a family history of  colon cancer in a maternal aunt, who was diagnosed in her 68s.  At  colonoscopy in January 2004, she was found to have a transverse colon  polyp, 6 mm, sessile, which was snared with cautery and removed.  The  remainder of the exam was normal.  Pathology on that polyp was  consistent with a hyperplastic polyp.  She was advised to have followup  in 10 years.  The patient did have a post-polypectomy thermal injury and  required a brief hospitalization after that procedure.   The patient currently says that she started noticing a small amount of  rectal bleeding in August 2008 primarily with bright red blood noted on  the tissue with bowel movements.  She says over the past month, these  symptoms had pretty much cleared up and then last week had an episode  with some mild abdominal cramping and bright red blood covering a normal  bowel movement.  She says that she felt fine prior to that episode, was  not constipated, did not have a lot of straining, etc, and was obviously  scared by the bleeding.  She says she had noted a small amount of blood  on one occasion since then, but none over the past few days.  She has no  complaints of rectal discomfort or hemorrhoid symptoms and says that her  bowel movements have been normal, but soft since.  She has lost about 10  pounds over the past 2 months, most of that unintentionally.   The patient was seen by Dr.  Alwyn Ren last week.  A CBC was done; her  hemoglobin was 13.5 and WBC of 5.5 and rectal exam per Dr. Alwyn Ren showed  a fissure at 5 o'clock and hemorrhoids.  She was referred here and given  Proctofoam cream to use.  The patient says she has not been using the  cream because she has not had any discomfort.   CURRENT MEDICATIONS:  1. Lisinopril 20 mg daily.  2. Vytorin 10/20 daily.  3. Cymbalta 60 daily.  4. Ultram p.r.n.  5. Aspirin 81 mg daily.  6. Fish oil daily.   ALLERGIES:  VIOXX and PREDNISONE intolerant.   PAST HISTORY:  Pertinent for:  1. Hyperlipidemia.  2. Hypertension.  3. She is status post hysterectomy.  4. History of colon polyps with thermal injury, 2004, as above.  5. Chronic fatigue syndrome.   FAMILY HISTORY:  Pertinent for heart disease in her brothers and father,  1 paternal uncle with diabetes, 1 maternal aunt with colon cancer in her  late 48s.   SOCIAL HISTORY:  The patient is married.  She is employed in an  administrative position.  She is a nonsmoker, nondrinker.   REVIEW OF SYSTEMS:  Pertinent for intermittent excessive thirst,  allergies, sinus symptoms, muscle cramping and feels that she has  noticed some increased fatigue and increased bruising recently.  Review  of systems is otherwise completely negative.   PHYSICAL EXAMINATION:  A well-developed white female in no acute  distress.  Height is 5 feet 6 inches.  Weight is 173.  Blood pressure 130/70.  Pulse is 100.  HEENT:  Nontraumatic, normocephalic.  EOMI.  PERRLA.  Sclerae anicteric.  NECK:  Supple.  CARDIOVASCULAR:  Regular rate and rhythm with S1 and S2, no murmur, rub  or gallop.  PULMONARY:  Clear to A&P.  ABDOMEN:  Soft.  She has minimal tenderness in the right lower quadrant,  no rebound or guarding, no mass or hepatosplenomegaly.  RECTAL:  No stool on the glove.  Mucus is trace heme-positive.  There  are no external hemorrhoids noted.  I cannot feel a fissure and exam is  otherwise  negative.   IMPRESSION:  1. [age]-year-old female with history of colon polyps with      recent rectal bleeding, rule out occult colon lesion, rule out      local anorectal source, that is, internal hemorrhoids or shallow      fissure which has healed.  2. Positive family history of colon cancer in a maternal aunt.  3. History of a post-polypectomy thermal injury, 2004.   PLAN:  Schedule colonoscopy with Dr. Leone Payor.  She had a difficult time  with sedation with her last procedure, with inadequate sedation, and we  will schedule this colonoscopy with propofol.  She was encouraged to use  the Proctofoam cream b.i.d. over the next 10 days.      Mike Gip, PA-C  Electronically Signed      Rachael Fee, MD  Electronically Signed   AE/MedQ  DD: 10/24/2007  DT: 10/25/2007  Job #: [id number]   cc:   Titus Dubin. Alwyn Ren, MD,FACP,FCCP

## 2011-05-01 NOTE — Assessment & Plan Note (Signed)
[facility]                        [facility] OFFICE NOTE   RHENDA, OREGON                    MRN:          [id number]  DATE:03/07/2007                            DOB:          18-Oct-1951    Christine [name] was seen March 07, 2007 for a complete physical  examination.   ACUTE PROBLEMS:  A sinobronchitis picture present for approximately a  week.  She was seen in the primary care March 21 and given over the  counter medications including Coricidin HBP and Delsym.  Chest x-ray was  negative.  She has had intermittent nasal obstruction and anosmia,  fever, chills, and fatigue.  She has headache only with cough.  She has  had some nasal congestion which is yellow and sputum which has been  yellow to white.  On one occasion she had loose stool which she  questioned as being dark or even possibly black early the morning of  March 24.   Her sugars have been somewhat elevated and the fasting sugar was 123 the  morning of the exam.  She denies polyuria, polyphagia, polydipsia.   PAST MEDICAL HISTORY:  1. Total abdominal hysterectomy and bilateral salpingo-oophorectomy      for endometriosis.  She had bladder tacking as well.  2. Remotely she had tonsillectomy.  3. Colonoscopy in 2004 with no polyps.  She was kept overnight for      pain which raised concerns for possible near perforation.  4. In 1997 she had a catheterization which was negative.  The pain was      attributed to chest wall  muscle injury.  5. She is gravida 2, para 2.   MEDICAL PROBLEMS:  1. Migraine.  2. Hypertension.  3. Dyslipidemia.  4. Diabetes.   FAMILY HISTORY:  Includes diabetes in an uncle, myocardial infarction in  her father and brother, and cancer of the lung in a  second brother.  Paternal uncle also died of myocardial infarction.   She does not drink or smoke.  Her exercise level has decreased recently  because of the bronchitis and problems with  muscle aches and pains in  the context of low vitamin D.  She is on no specific diet.   She bedtime presently on Prinvil 20 mg daily, 81 mg of aspirin, fish oil  4 per day, Vytorin 10/20, vitamin D in high dose supplements.  She is at  the high dose supplements because of the vitamin D level of 15.   SHE IS INTOLERANT/ALLERGIC TO VIOXX AND PREDNISONE.   REVIEW OF SYSTEMS:  Was completed in toto, the positives are noted  above.   She is 5 feet 5 inches and 3/4, weight 178 which is up 2 pounds,  temperature 99.6, pulse 68, respiratory rate 16, and blood pressure  112/70.  Positive physical findings included a slight arterial  narrowing.  The tympanic membranes were slightly dull.  The remainder of  the otolaryngologic exam was unremarkable.  She had no lymphadenopathy  about the head, neck, or axilla.  Thyroid was normal to palpation.  Chest was clear without wheezing and she had an  intermittent nagging  nonproductive cough.  A grade 1 systolic murmur was present.  All pulses  were intact with no edema.   She has no organomegaly or masses.   No skin lesions were noted.   Musculoskeletal exam was unremarkable.   Neuropsychiatric exam was also negative.  Deep tendon reflexes were  normal.   EKG was within normal limits.   Routine labs will be drawn; additionally A1c will also be collected.  Lipids and fasting liver function tests will be done to assess her  response to the Vytorin.   For the sinobronchitis symptoms, Septra DS every 12 hours with 8 ounces  of water and codeine with Phenergan will be prescribed.   Her surveillance is up to date.  A colonoscopy is due in 2014.  Mammograms will be due in August of 2008.  Her bone density does not  need to be repeated until August of 2009.     Titus Dubin. Alwyn Ren, MD,FACP,FCCP  Electronically Signed    [facility]/MedQ  DD: 03/07/2007  DT: 03/07/2007  Job #: [id number]

## 2011-05-01 NOTE — H&P (Signed)
NAME:  Christine [name], Christine [name] NO.:  [id number]   MEDICAL RECORD NO.:  [id number]                   PATIENT TYPE:  INP   LOCATION:  0445                                 FACILITY:  [facility]   PHYSICIAN:  Iva Boop, M.D. [facility]           DATE OF BIRTH:  February 25, 1951   DATE OF ADMISSION:  12/26/2002  DATE OF DISCHARGE:                                HISTORY & PHYSICAL   CHIEF COMPLAINT:  Abdominal pain after colonoscopy.   HISTORY:  This is a 60 year old married white woman that underwent  colonoscopy and polypectomy by me on December 22, 2002.  A 6 mm polyp was  removed from the transverse colon approximately 90 cm from the anus.  Snare  cautery procedure was used and she tolerated this well throughout the  weekend.  Last night, she developed some abdominal pain in the periumbilical  area to the right of the umbilicus.  She had a lot of borborygmi, could not  sleep too well.  Did not have any fever.  She ate Oatmeal this morning and  has felt like it sat in her stomach and she could not eat as much as she  used to.  No dysphagia, nausea, or vomiting.  Felt a little better as the  day went on, but her supervisor urged her to call our office.  She was  brought in for evaluation.  She has had one loose bowel movement and two  small pale bowel movements since the colonoscopy.  She is passing some gas  but not much.  She does not feel distended.  In the office her temperature  was 99.3.  She has taken Tylenol over the weekend for headaches which are  chronic.   REVIEW OF SYSTEMS:  Otherwise notable for a cystocele and she has had some  hematuria but is otherwise negative at this time and unchanged from previous  evaluation.   MEDICATIONS:  1. Lisinopril 40 mg each day.  2. Lipitor 40 mg each day.  3. Prilosec 20 mg each day.  4. Premarin 0.625 mg each day.  5. Aspirin 81 mg every other day which is being held.  6. Tylenol as needed.   DRUG ALLERGIES:  None  known.   PAST MEDICAL HISTORY:  1. Hypertension.  2. Dyslipidemia.  3. Chronic headaches.  4. Hysterectomy for endometriosis.  5. Negative heart catheterization in 1997.  6. Questionable TIA last Fall.   FAMILY HISTORY:  Positive for heart disease but not colon cancer.   SOCIAL HISTORY:  She is married.  No alcohol, tobacco, or drugs.   PHYSICAL EXAMINATION:  GENERAL:  A well-developed obese white woman in no  acute distress.  HEENT:  Eyes anicteric.  NECK:  Supple, no mass.  LUNGS:  Clear.  HEART:  S1 & S2.  No rubs, murmurs, or gallops.  ABDOMEN:  Soft, obese, bowel sounds are present.  There is some focal  tenderness  to the right of the umbilicus.  This is involuntary guarding with  moderate pressure.  The remainder of the abdomen is soft and benign and  there is no increased tympany.  EXTREMITIES:  No edema.  BACK:  Nontender.  NEUROLOGICAL:  She is alert and oriented x3.  LYMPH NODES:  No neck or supraclavicular lymphadenopathy palpated.  SKIN:  Free of obvious rash.   ASSESSMENT:  Abdominal pain after colonoscopy and polypectomy.  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   PLAN:  I think admission to the hospital for observation and intravenous  fluids and IV antibiotics with Unasyn is appropriate and I have explained  that to her.  Will check laboratory studies with CBC and CMET and an acute  abdominal series to look for any free air though that seems unlikely.  Diet  of ice chips and sips of liquids.  If her clinical condition worsens then a  surgical consult with a CT scan would be likely.  She says she actually  feels somewhat better today than she did last night, but given these  findings I think the plan of care is appropriate and we will see how her  clinical course pans out.  I did explain to her that a delayed perforation  is possible, that I did not think that was the case at this time, that given  the possibility this  plan of care is appropriate.                                               Iva Boop, M.D. [facility]    CEG/MEDQ  D:  12/26/2002  T:  12/26/2002  Job:  [id number]

## 2011-05-01 NOTE — Assessment & Plan Note (Signed)
[facility]                        [facility] OFFICE NOTE   EVALENA, FUJII                    MRN:          [id number]  DATE:01/11/2007                            DOB:          25-Apr-1951    SUBJECTIVE:  The patient was seen with multiple complaints.  This  included malodorous urine for two months, right ear pain and diffuse  arthralgias and myalgias.  Significantly she has a history of a vitamin  D deficiency; in May 2007 her vitamin D-25 hydroxy level was 13 (normal  >30).  She was treated with 8000 international units/1 cc of vitamin D2  (ergocalciferol) daily orally for three months.  In September her  vitamin D level was 18 and 2000 international units of vitamin D daily  was recommended.  On January 11, 2007, her repeat level was only 15.   A urinalysis did reveal over 100,000 E. coli, for which Cipro was  initiated.   She is on Vytorin for dyslipidemia.  Her creatinine kinase was normal at  102.  Rheumatoid factor was negative at less than 20 and sedimentation  rate was 14.   The ergocalciferol will be reinitiated at 16,000 units (2 cc) daily with  a repeat vitamin D level in three months.   Her otolaryngologic examination was unremarkable; there was no evidence  of temporal mandibular joint dysfunction.  Humidification ; use of a  liquifying agent such as Mucinex; & Valsalva maneuvers throughout the  day were recommended for the eustachian tube dysfunction.     Titus Dubin. Alwyn Ren, MD,FACP,FCCP  Electronically Signed    [facility]/MedQ  DD: 01/26/2007  DT: 01/26/2007  Job #: [id number]

## 2011-05-01 NOTE — Consult Note (Signed)
[facility]  Patient:    Christine [name], Christine [name] Visit Number: [id number] MRN: [id number]          Service Type: EMS Location: ED Attending Physician:  Donnetta Hutching Dictated by:   Marlan Palau, M.D. Proc. Date: 10/24/01 Admit Date:  10/24/2001 Discharge Date: 10/24/2001   CC:         Christine [name], M.D.   Consultation Report  HISTORY OF PRESENT ILLNESS:  Christine [name] is a 60 year old left-handed white female -- born [birth year] -- with a history of hypertension, followed by Dr. Tinnie Gens C. Hooper.  This patient had onset two weeks ago of some right shoulder bursitis with pain and some slight tingly sensations in the shoulder. Patient was placed on Vioxx at 50 mg a day.  Patient was at work four days ago and had onset of right arm and right face paresthesias and some spread to the right leg as well.  Patient denied any clumsiness or weakness of the right side, denied any visual field changes, headache, speech changes, gait disturbance.  Patient was seen again by Dr. Quintella [name] and noted to have significant elevation in blood pressure, was taken off of the Vioxx and sent out.  Patient returned today for another blood pressure check, was again noted to have significant hypertension and with a history of the paresthesias, was sent to the emergency room.  Patient, if anything, has had improvement in her symptomatology with improvement in the paresthesias in the right leg.  Patient still has some problems in the right arm.  Neurology was asked to see this patient for further evaluation.  CT scan of the head was unremarkable with the exception of a questionable right cortical frontal old infarct.  No new or acute changes were seen.  PAST MEDICAL HISTORY: 1. History of hypertension. 2. Obesity. 3. History of chest pain with negative heart catheterization in the past. 4. History of migraines. 5. History of hypercholesterolemia. 6. Obesity. 7. Status  post hysterectomy in 1983. 8. History of tonsillectomy.  MEDICATIONS PRIOR TO THIS EVALUATION: 1. Lipitor 40 mg a day. 2. Prinivil 20 mg a day. 3. Patient has been taking some aspirin but has not been taking it on a    regular basis.  ALLERGIES:  Patient has an intolerance to PREDNISONE.  HABITS:  Does not smoke or drink.  SOCIAL HISTORY:  Patient lives in the [place] area, does work, is married, has two children who are alive and well.  FAMILY MEDICAL HISTORY:  Notable for the fact that mother died with emphysema, father died with heart disease.  Patient has two brothers that passed away, both with MIs.  Patient has one brother who is alive and well.  REVIEW OF SYSTEMS:  Notable for no fevers or chills.  Patient does have a history of migraine headaches, no recent headache.  Denies shortness of breath, chest pains, nausea or vomiting, bowel or bladder control problems, blackout episodes or dizziness.  PHYSICAL EXAMINATION:  VITALS:  Blood pressure is 146/76.  Heart rate 85.  Respiratory rate 18. Temperature:  Afebrile.  GENERAL:  This patient is a moderately obese white female who is alert and cooperative at the time of examination.  HEENT:  Head is atraumatic.  Eyes:  Pupils are equal, round and reactive to light.  Disks are flat bilaterally.  NECK:  Supple.  No carotid bruits are noted.  RESPIRATORY:  Examination is clear.  CARDIOVASCULAR:  Regular rate and rhythm without obvious murmurs or  rubs noted.  EXTREMITIES:  Without significant edema.  NEUROLOGIC:  Cranial nerves as above.  Facial symmetry is present.  Patient has good sensation of the face to pinprick and soft touch bilaterally. Patient has good strength of facial muscles and the muscles with head-turning and shoulder shrugs bilaterally.  Visual fields are full to double simultaneous stimulation.  Speech is well-enunciated and not aphasic.  Motor testing reveals 5/5 strength in all fours.  Good and  symmetric motor tone is noted bilaterally.  Sensory testing is intact to pinprick, soft touch and vibratory sensation throughout.  Patient has good finger-to-nose-to-finger and toe-to-finger bilaterally.  Patient was not ambulated.  Deep tendon reflexes are depressed but symmetric.  Toes are neutral bilaterally.  LABORATORY DATA:  CT of the head is as above.  INR of 1.1.  Sodium 140, potassium 3.8, chloride of 106, CO2 26, glucose of 99, BUN of 10, creatinine 0.7 and calcium 9.1.  White count of 5.9, hemoglobin of 13.3, hematocrit of 38.9, MCV of 85.8, platelets of 246,000.  EKG reveals normal sinus rhythm with RSR or QR pattern with right ventricular conduction delay, ventricular rate of 83.  IMPRESSION: 1. Probable small left thalamic stroke. 2. Hypertension.  This patient has very good blood pressures today in the emergency room. Patient was recently placed on Vioxx which may have resulted in some fluid retention and spike in blood pressure.  Patient has a history consistent with a small left thalamic stroke but at this point, has had symptomatology for four days that has actually improved over time and patient is quite stable. Will place patient back on aspirin and pursue an outpatient workup.  PLAN: 1. Patient is to be discharged to home. 2. MRI of the brain. 3. MR angiogram. 4. Two-dimensional echocardiogram. 5. Aspirin therapy. 6. Will follow up with [facility] within three weeks or so    for an evaluation. Dictated by:   Marlan Palau, M.D. Attending Physician:  Donnetta Hutching DD:  10/24/01 TD:  10/25/01 Job: [id number]

## 2011-05-01 NOTE — Discharge Summary (Signed)
NAME:  Christine [name], Christine [name]                       ACCOUNT NO.:  [id number]   MEDICAL RECORD NO.:  [id number]                   PATIENT TYPE:  INP   LOCATION:  0445                                 FACILITY:  [facility]   PHYSICIAN:  Malcolm T. Russella Dar, M.D. [facility]          DATE OF BIRTH:  Apr 30, 1951   DATE OF ADMISSION:  12/26/2002  DATE OF DISCHARGE:  12/27/2002                                 DISCHARGE SUMMARY   ADMITTING DIAGNOSES:  37. A 61 year old female with focal abdominal pain four days status post     colonoscopy and polypectomy, consistent with post-polypectomy burn     syndrome.  2. Single transverse colon polyp at colonoscopy.  3. Hypertension.  4. Dyslipidemia.  5. Chronic headaches.  6. Status post hysterectomy for endometriosis.  7. Questionable transient ischemic attack, fall 2003.   DISCHARGE DIAGNOSES:  1. Stable and symptomatically improved with signs and symptoms consistent     with a post-polypectomy burn syndrome.  2. Other diagnoses as listed above.   CONSULTATIONS:  None.   PROCEDURES:  Plain abdominal films.   HISTORY:  Christine [name] is a pleasant 60 year old white female who had undergone  colonoscopy and polypectomy by Dr. Leone Payor on 12/22/02.  She was found to have  a 6 mm polyp in the transverse colon that was snared and cauterized.  She  tolerated the procedure well, and said that she had no discomfort throughout  this last weekend; however, on the evening of 12/25/01, she developed some  abdominal discomfort in the periumbilical and to the right of the umbilicus,  noted more active bowel sounds, and said that the discomfort disturbed her  sleep.  She had been unaware of any fever, but thought she has had some hot  and cold sensation.  On the morning of 12/26/01, she had eaten oatmeal for  breakfast and said she felt full.  No nausea or vomiting.  She then also ate  lunch today which made her more uncomfortably and full.  She called the  office, and was seen and  evaluated in the office by Dr. Leone Payor.  Noted to  have a soft abdomen with focal tenderness in the right mid-quadrant and some  involuntary guarding.  Temperature was noted to be 99.3.  She had had a  couple of bowel movements since the colonoscopy.  A decision was made to  admit the patient for observation, bowel rest, IV antibiotics, and for  abdominal films to exclude free air.   LABORATORY STUDIES:  On admission, WBC was 6.5, hemoglobin 13.1, hematocrit  38.6, MCV 86.8, platelets 257, potassium 3.8, BUN 12, creatinine 0.8,  albumin 3.9.  Liver function studies normal with the exception of an AST at  46.  Follow up on 12/27/01 showed a WBC of 5, hemoglobin 12.6, hematocrit  36.8.  Electrolytes within normal limits.  Glucose was 146.   Abdominal films, multi-ab, showed no evidence for free air.  No ileus.  She  was noted to have an increased amount of stool in the colon.   HOSPITAL COURSE:  The patient was admitted from the office to the hospital.  She was placed on sips of clear liquids and IV fluids.  She was given  Demerol as needed for pain and Phenergan for nausea.  Started on IV Unasyn  at 3 gm q.h.s.  Abdominal films were done the day of admission which did not  show any abnormality.  By the following morning, the patient was improved  and had only taken one dose of Demerol since admission, and no other pain  medication.  She said she was feeling significantly better, and had already  started to feel better in the afternoon of 12/26/02.  She was afebrile, white  count remained within normal limits, and her abdominal exam was improved  with some residual focal right mid-quadrant tenderness, but no guarding.  Plans were made for her to be discharged on the afternoon of 12/27/01 with  instructions to stay out of work and remain at home on limited activity over  the next 48 hours.  She was instructed to stay on a full liquid diet until  seen in the office.  Follow up appointment with  Dr. Leone Payor was made for  Friday, 12/29/02 at 10:45 a.m.  The patient was advised to call for  temperature of 100 or greater, increase in pain, nausea, vomiting,  hematochezia, etc.   MEDICATIONS ON DISCHARGE:  Cipro 500 mg p.o. b.i.d. x5 days.  Tylenol on a  p.r.n. basis.  She was advised to continue holding her aspirin for two weeks  post-polypectomy.  Other meds as previous - lisinopril 40 mg daily, Lipitor  40 daily, Prilosec 20 p.o. daily, Premarin 0.625 p.o. daily.   CONDITION ON DISCHARGE:  Stable and improved.      Amy Esterwood, P.A.-C. [facility]                Malcolm T. Russella Dar, M.D. [facility]    AE/MEDQ  D:  12/27/2002  T:  12/27/2002  Job:  [id number]

## 2011-05-07 ENCOUNTER — Telehealth: Payer: Self-pay

## 2011-05-07 NOTE — Telephone Encounter (Signed)
Message copied by Stephan Minister on Thu May 07, 2011  9:28 AM ------      Message from: Marga Melnick      Created: Wed May 06, 2011  5:46 PM       Please send copy of the Holter report. There are no significant heart rhythm irregularities present.

## 2011-05-07 NOTE — Telephone Encounter (Signed)
Copy of Holter monitor report mailed to patient

## 2011-05-15 ENCOUNTER — Ambulatory Visit (HOSPITAL_COMMUNITY): Payer: 59 | Attending: Cardiovascular Disease | Admitting: Radiology

## 2011-05-15 DIAGNOSIS — R002 Palpitations: Secondary | ICD-10-CM

## 2011-05-15 DIAGNOSIS — M79609 Pain in unspecified limb: Secondary | ICD-10-CM | POA: Insufficient documentation

## 2011-05-15 DIAGNOSIS — I491 Atrial premature depolarization: Secondary | ICD-10-CM

## 2011-05-15 DIAGNOSIS — R5381 Other malaise: Secondary | ICD-10-CM | POA: Insufficient documentation

## 2011-05-15 DIAGNOSIS — R011 Cardiac murmur, unspecified: Secondary | ICD-10-CM | POA: Insufficient documentation

## 2011-05-15 DIAGNOSIS — E785 Hyperlipidemia, unspecified: Secondary | ICD-10-CM | POA: Insufficient documentation

## 2011-05-15 DIAGNOSIS — E119 Type 2 diabetes mellitus without complications: Secondary | ICD-10-CM | POA: Insufficient documentation

## 2011-05-15 DIAGNOSIS — R0989 Other specified symptoms and signs involving the circulatory and respiratory systems: Secondary | ICD-10-CM

## 2011-05-15 DIAGNOSIS — I10 Essential (primary) hypertension: Secondary | ICD-10-CM | POA: Insufficient documentation

## 2011-05-18 ENCOUNTER — Telehealth: Payer: Self-pay | Admitting: Cardiovascular Disease

## 2011-05-18 NOTE — Telephone Encounter (Signed)
Returning call from christine

## 2011-05-18 NOTE — Telephone Encounter (Signed)
Pt rtn call and she is at work pls call [phone] she will be at work until [employer]

## 2011-05-19 NOTE — Telephone Encounter (Signed)
PT AWARE  

## 2011-07-23 ENCOUNTER — Other Ambulatory Visit: Payer: Self-pay | Admitting: Internal Medicine

## 2011-07-23 NOTE — Telephone Encounter (Signed)
Recheck A1c in 6 weeks(250.02). Hopp Copied from 02/2011

## 2011-07-27 ENCOUNTER — Other Ambulatory Visit: Payer: Self-pay | Admitting: Internal Medicine

## 2011-07-28 MED ORDER — LORAZEPAM 0.5 MG PO TABS: 0.5000 mg | ORAL_TABLET | Freq: Two times a day (BID) | ORAL | Status: DC | PRN

## 2011-07-28 NOTE — Telephone Encounter (Signed)
RX sent to pharmacy  

## 2011-08-12 ENCOUNTER — Ambulatory Visit (INDEPENDENT_AMBULATORY_CARE_PROVIDER_SITE_OTHER): Payer: 59 | Admitting: Internal Medicine

## 2011-08-12 ENCOUNTER — Encounter: Payer: Self-pay | Admitting: Internal Medicine

## 2011-08-12 DIAGNOSIS — F329 Major depressive disorder, single episode, unspecified: Secondary | ICD-10-CM

## 2011-08-12 DIAGNOSIS — Z8679 Personal history of other diseases of the circulatory system: Secondary | ICD-10-CM

## 2011-08-12 DIAGNOSIS — R5383 Other fatigue: Secondary | ICD-10-CM

## 2011-08-12 DIAGNOSIS — M797 Fibromyalgia: Secondary | ICD-10-CM

## 2011-08-12 DIAGNOSIS — IMO0001 Reserved for inherently not codable concepts without codable children: Secondary | ICD-10-CM

## 2011-08-12 DIAGNOSIS — R5381 Other malaise: Secondary | ICD-10-CM

## 2011-08-12 DIAGNOSIS — F3289 Other specified depressive episodes: Secondary | ICD-10-CM

## 2011-08-12 HISTORY — DX: Fibromyalgia: M79.7

## 2011-08-12 LAB — MICROALBUMIN / CREATININE URINE RATIO
Creatinine,U: 46.9 mg/dL
Microalb Creat Ratio: 1.1 mg/g (ref 0.0–30.0)
Microalb, Ur: 0.5 mg/dL (ref 0.0–1.9)

## 2011-08-12 NOTE — Progress Notes (Signed)
  Subjective:    Patient ID: Christine [name], female    DOB: 1951/05/24, 60 y.o.   MRN: [id number]  HPI  Sleep Dysfunction Onset:since Cymbalta started Pattern: Difficulty going to sleep:no Frequent awakening:yes Early awakening:yes Nightmares:"vivid dreams of (husband) leaving me" Abnormal leg movement: husband describes jerks Snoring:yes Apnea:no Risk factors/sleep hygiene: Stimulants:Diet Pepsi 12 oz , 2/day Alcohol intake:no Reading, watching TV, eating @ bedtime: occasionally reads Daytime naps:no Stress/anxiety:working with Dr Ofilia Neas to address issues related to son's death in 06-Mar-2010 Work/travel factors:no Impact: Daytime hypersomnolence: no Motor vehicle accident/motor dysfunction:no Treatment to date/efficacy: Flexeril 10 mg qhs nightly  She was diagnosed with fibromyalgia in 2010 by  Dr. Maurice Small, rheumatologist.  At that same time she saw Dr. Marylou Flesher, neurologist/sleep specialist who  felt  a sleep study was not needed.          Review of Systems she's had chronic dry eyes and dry mouth. As noted she seen a rheumatologist; she has known diagnosis of scleroderma.     Objective:   Physical Exam Gen.: Healthy and well-nourished in appearance. Alert, appropriate and cooperative throughout exam. Head: Normocephalic without obvious abnormalities  Eyes: No corneal or conjunctival inflammation noted.  Extraocular motion intact. No lid lag or exophthalmos.Neck: No deformities, masses, or tenderness noted. Range of motion &. Thyroid normal. Lungs: Normal respiratory effort; chest expands symmetrically. Lungs are clear to auscultation without rales, wheezes, or increased work of breathing. Heart: Normal rate and rhythm. Normal S1 and S2. No gallop, click, or rub. Grade 1/6 systolic basilar  murmur.                                                               Musculoskeletal/extremities: No deformity or scoliosis noted of  the thoracic or lumbar  spine. No clubbing, cyanosis, edema, or deformity noted.Tone & strength  normal.Joints normal. Nail health  good. Vascular: Carotid, radial artery, dorsalis pedis and  posterior tibial pulses are full and equal. No bruits present. Neurologic: Alert and oriented x3. Deep tendon reflexes symmetrical and normal.  Tremor of hands, R> L.         Skin: Intact without suspicious lesions or rashes. Lymph: No cervical, axillary  lymphadenopathy present. Psych: Mood and affect  Slightly flat but normally interactive                                                                                         Assessment & Plan:  #1 sleep disorder, temporally related to Cymbalta  #2 subjective dry eyes and dry mouth.  Plan: Wean & discontinue the Cymbalta. Ophthalmologic evaluation for excess drying of the.

## 2011-08-12 NOTE — Patient Instructions (Signed)
.  Share report  with your Ophthalmologist .  Take Cymbalta 30 mg once daily for one week and then discontinue. Discuss his status after coming off the Cymbalta with Dr. Margaretmary Eddy.

## 2011-08-12 NOTE — Progress Notes (Signed)
Addended byMarga Melnick F on: 08/12/2011 01:05 PM   Modules accepted: Orders

## 2011-08-25 ENCOUNTER — Telehealth: Payer: Self-pay

## 2011-08-25 NOTE — Telephone Encounter (Signed)
Pt aware samples ready for pick up 

## 2011-08-25 NOTE — Telephone Encounter (Signed)
Pt states that she is trying come off cymbalta and is having a hard time. Pt requesting callback

## 2011-08-25 NOTE — Telephone Encounter (Signed)
I do not recommend splitting the Cymbalta. If the symptoms are not controlled on 30 mg daily, I would recommend consultation with  a specialist to assess other options. Please discuss this option with your Therapist.

## 2011-08-25 NOTE — Telephone Encounter (Signed)
Pick up Cymbalta 30 mg samples to be taken once daily; after one week increase to 60 mg daily. This would be the safest medication for you to take. It is not associated with weight gain.

## 2011-08-25 NOTE — Telephone Encounter (Signed)
Pt called crying saying that she doesn't think she can go back on the cymbalta because of the bad dreams and she feels like something is crawling on her. She would like to wean off of the cymbalta over a longer period of time. Would you recommend her starting the 30 mg's again, then splitting them to take 15 mg. Please advise

## 2011-08-25 NOTE — Telephone Encounter (Signed)
Crying, nausea, dizziness, "feels like things are crawling on me." Ativan helps a little but would like advise on what she can do. Should she resume the cymbalta or try something new. Please advise

## 2011-08-26 NOTE — Telephone Encounter (Signed)
Pt aware and notes that she will take the 30 mg for another week but she is not going to take the 60 mg. Pt also advised to discuss options with Therapist.

## 2011-08-27 ENCOUNTER — Other Ambulatory Visit: Payer: Self-pay | Admitting: Internal Medicine

## 2011-09-22 ENCOUNTER — Other Ambulatory Visit: Payer: Self-pay | Admitting: Internal Medicine

## 2011-09-22 LAB — CBC
Hemoglobin: 14.3
MCHC: 34.9
RDW: 13.3

## 2011-09-22 LAB — DIFFERENTIAL
Basophils Absolute: 0
Basophils Relative: 0
Lymphocytes Relative: 36
Neutro Abs: 3.2
Neutrophils Relative %: 55

## 2011-09-28 ENCOUNTER — Telehealth: Payer: Self-pay | Admitting: *Deleted

## 2011-09-28 NOTE — Telephone Encounter (Signed)
Prior Auth approved 09-23-11 until 06-18-14. Approval letter scan to chart.

## 2011-10-09 ENCOUNTER — Ambulatory Visit (INDEPENDENT_AMBULATORY_CARE_PROVIDER_SITE_OTHER): Payer: 59 | Admitting: Internal Medicine

## 2011-10-09 ENCOUNTER — Encounter: Payer: Self-pay | Admitting: Internal Medicine

## 2011-10-09 DIAGNOSIS — Z0000 Encounter for general adult medical examination without abnormal findings: Secondary | ICD-10-CM

## 2011-10-09 DIAGNOSIS — Z8679 Personal history of other diseases of the circulatory system: Secondary | ICD-10-CM

## 2011-10-09 DIAGNOSIS — D126 Benign neoplasm of colon, unspecified: Secondary | ICD-10-CM

## 2011-10-09 DIAGNOSIS — E119 Type 2 diabetes mellitus without complications: Secondary | ICD-10-CM

## 2011-10-09 DIAGNOSIS — I10 Essential (primary) hypertension: Secondary | ICD-10-CM

## 2011-10-09 DIAGNOSIS — E559 Vitamin D deficiency, unspecified: Secondary | ICD-10-CM

## 2011-10-09 DIAGNOSIS — E785 Hyperlipidemia, unspecified: Secondary | ICD-10-CM

## 2011-10-09 LAB — BASIC METABOLIC PANEL
BUN: 13 mg/dL (ref 6–23)
Calcium: 9.2 mg/dL (ref 8.4–10.5)
Chloride: 104 mEq/L (ref 96–112)
Creatinine, Ser: 0.7 mg/dL (ref 0.4–1.2)
GFR: 94.68 mL/min (ref >60.00)

## 2011-10-09 LAB — CBC WITH DIFFERENTIAL/PLATELET
Eosinophils Relative: 1.2 % (ref 0.0–5.0)
Lymphocytes Relative: 37.5 % (ref 12.0–46.0)
MCV: 87.7 fl (ref 78.0–100.0)
Monocytes Absolute: 0.5 10*3/uL (ref 0.1–1.0)
Neutrophils Relative %: 53.2 % (ref 43.0–77.0)
Platelets: 228 10*3/uL (ref 150.0–400.0)
RBC: 4.6 Mil/uL (ref 3.87–5.11)
WBC: 6 10*3/uL (ref 4.5–10.5)

## 2011-10-09 LAB — HEPATIC FUNCTION PANEL
ALT: 45 U/L — ABNORMAL HIGH (ref 0–35)
Albumin: 4.2 g/dL (ref 3.5–5.2)
Alkaline Phosphatase: 48 U/L (ref 39–117)
Bilirubin, Direct: 0 mg/dL (ref 0.0–0.3)
Total Protein: 7.4 g/dL (ref 6.0–8.3)

## 2011-10-09 LAB — LIPID PANEL
Cholesterol: 154 mg/dL (ref 0–200)
HDL: 46.8 mg/dL (ref >39.00)
LDL Cholesterol: 86 mg/dL (ref 0–99)
Triglycerides: 106 mg/dL (ref 0.0–149.0)

## 2011-10-09 LAB — MICROALBUMIN / CREATININE URINE RATIO
Creatinine,U: 63.7 mg/dL
Microalb Creat Ratio: 1.3 mg/g (ref 0.0–30.0)
Microalb, Ur: 0.8 mg/dL (ref 0.0–1.9)

## 2011-10-09 NOTE — Patient Instructions (Signed)
Preventive Health Care: Exercise  30-45  minutes a day, 3-4 days a week. Walking is especially valuable in preventing Osteoporosis. Eat a low-fat diet with lots of fruits and vegetables, up to 7-9 servings per day. Consume less than 30 grams of sugar per day from foods & drinks with High Fructose Corn Syrup as # 1,2,3 or #4 on label. Eye Doctor - have an eye exam @ least annually To prevent palpitations or premature beats, avoid stimulants such as decongestants, diet pills, nicotine, or caffeine (coffee, tea, cola, or chocolate) to excess.

## 2011-10-09 NOTE — Progress Notes (Signed)
Subjective:    Patient ID: Christine [name], female    DOB: 20-Nov-1951, 60 y.o.   MRN: [id number]  HPI  Deeksha is here for a physical;acute issues include palpitations recently. She is using Propranolol Rxed by Dr Eden Emms. She saw him this Summer and had Holter monitor, ECHO & stress test completed.      Review of Systems HYPERTENSION: Disease Monitoring: Blood pressure range-110-120/60s  Chest pain- no       Dyspnea- no Medications: Compliance- yes  Lightheadedness,Syncope- no    Edema- no  DIABETES: Disease Monitoring: Blood Sugar ranges-120-150 Polyuria/phagia/dipsia- no       Visual problems- no; last Ophth exam 8/12 : no retinopathy Medications: Compliance- yes  Hypoglycemic symptoms- no  HYPERLIPIDEMIA: Medications: Compliance- yes  Abd pain, bowel changes- no   Muscle aches- occasional Fibromyalgia symptoms           Objective:   Physical Exam Gen.: Healthy and well-nourished in appearance. Alert, appropriate and cooperative throughout exam. Head: Normocephalic without obvious abnormalities Eyes: No corneal or conjunctival inflammation noted. Pupils equal round reactive to light and accommodation. Fundal exam is benign without hemorrhages, exudate, papilledema. Extraocular motion intact. Vision grossly normal. Ears: External  ear exam reveals no significant lesions or deformities. Canals clear .TMs normal. Hearing is grossly normal bilaterally. Nose: External nasal exam reveals no deformity or inflammation. Nasal mucosa are pink and moist. No lesions or exudates noted. Septum  normal  Mouth: Oral mucosa and oropharynx reveal no lesions or exudates. Teeth in good repair. Neck: No deformities, masses, or tenderness noted. Range of motion & Thyroid normal. ? Cervical rib on L Lungs: Normal respiratory effort; chest expands symmetrically. Lungs are clear to auscultation without rales, wheezes, or increased work of breathing. Heart: Normal rate and rhythm. Normal S1  and S2. No  click or rub. S 4 with flow  murmur. Abdomen: Bowel sounds normal; abdomen soft and nontender. No masses, organomegaly or hernias noted. Genitalia: S/P TAH & BSO   .                                                                                   Musculoskeletal/extremities: No deformity or scoliosis noted of  the thoracic or lumbar spine. No clubbing, cyanosis, edema, or deformity noted. Range of motion  normal .Tone & strength  normal.Joints normal. Nail health  good. Vascular: Carotid, radial artery, dorsalis pedis and  posterior tibial pulses are full and equal. No bruits present. Neurologic: Alert and oriented x3. Deep tendon reflexes symmetrical and normal.          Skin: Intact without suspicious lesions or rashes. Lymph: No cervical, axillary lymphadenopathy present. Psych: Mood and affect are normal. Normally interactive  Assessment & Plan:  #1 comprehensive physical exam; no acute findings #2 see Problem List with Assessments & Recommendations Plan: see Orders

## 2011-10-10 LAB — VITAMIN D 25 HYDROXY (VIT D DEFICIENCY, FRACTURES): Vit D, 25-Hydroxy: 51 ng/mL (ref 30–89)

## 2011-10-13 ENCOUNTER — Ambulatory Visit (INDEPENDENT_AMBULATORY_CARE_PROVIDER_SITE_OTHER): Payer: BC Managed Care – PPO

## 2011-10-13 ENCOUNTER — Other Ambulatory Visit: Payer: Self-pay | Admitting: Internal Medicine

## 2011-10-13 DIAGNOSIS — Z23 Encounter for immunization: Secondary | ICD-10-CM

## 2011-10-16 NOTE — Progress Notes (Signed)
Addended by: Legrand Como on: 10/16/2011 11:02 AM   Modules accepted: Orders

## 2011-10-26 ENCOUNTER — Other Ambulatory Visit: Payer: Self-pay | Admitting: Internal Medicine

## 2011-11-23 ENCOUNTER — Other Ambulatory Visit: Payer: Self-pay | Admitting: Internal Medicine

## 2012-01-05 ENCOUNTER — Telehealth: Payer: Self-pay | Admitting: Internal Medicine

## 2012-01-05 ENCOUNTER — Encounter: Payer: Self-pay | Admitting: Internal Medicine

## 2012-01-05 ENCOUNTER — Ambulatory Visit (INDEPENDENT_AMBULATORY_CARE_PROVIDER_SITE_OTHER): Payer: BC Managed Care – PPO | Admitting: Internal Medicine

## 2012-01-05 DIAGNOSIS — R319 Hematuria, unspecified: Secondary | ICD-10-CM

## 2012-01-05 DIAGNOSIS — N390 Urinary tract infection, site not specified: Secondary | ICD-10-CM

## 2012-01-05 DIAGNOSIS — E119 Type 2 diabetes mellitus without complications: Secondary | ICD-10-CM

## 2012-01-05 LAB — POCT URINALYSIS DIPSTICK
Bilirubin, UA: NEGATIVE
Glucose, UA: NEGATIVE
Nitrite, UA: NEGATIVE
Urobilinogen, UA: 0.2

## 2012-01-05 MED ORDER — NITROFURANTOIN MONOHYD MACRO 100 MG PO CAPS: 100.0000 mg | ORAL_CAPSULE | Freq: Two times a day (BID) | ORAL | Status: AC

## 2012-01-05 NOTE — Progress Notes (Signed)
  Subjective:    Patient ID: ALCIE RUNIONS, female    DOB: 10/06/1951, 61 y.o.   MRN: [id number]  HPI ABNORMAL URINE: Onset: 1/18 as cloudy, smelly urine    Worsening: stable Symptoms Urgency: no  Frequency: no  Hesitancy: no  Hematuria: no  Flank Pain: yes, on R  Fever: no    Nausea/Vomiting: no  Dysuria: no  Discharge: no   Red Flags  : (Risk Factors for Complicated UTI) Recent Antibiotic Usage (last 30 days): yes, 3 X in past 5 months   More than 3 UTI's last 12 months: yes,  urine cultures have been positive in September, October, and November at an urgent care. She received urgent care as his symptoms occurred over the weekend. She is unable to tell me the specific organism, but she was told that the antibiotics prescribed would eradicate the infection. PMH of  1. DM: yes, FBS 116-140s 2. Renal Disease/Calculi: no 3. Urinary Tract Abnormality: no  4. Instrumentation/Trauma: Dr Retta Diones did cystoscopy :residual  urine retention 5. Immunosuppression: no  Urinalysis shows small amount of blood and 2+ leukocytes.      Review of Systems     Objective:   Physical Exam General appearance is one of good health and nourishment w/o distress.  Eyes: No conjunctival inflammation or scleral icterus is present.     Heart:  Normal rate and regular rhythm. S1 and S2 normal without gallop, murmur, click, rub or other extra sounds     Lungs:Chest clear to auscultation; no wheezes, rhonchi,rales ,or rubs present.No increased work of breathing.   Abdomen: bowel sounds normal, soft and non-tender without masses, organomegaly or hernias noted.  No guarding or rebound ; no flank tenderness  Skin:Warm & dry ; no jaundice or tenting  Lymphatic: No lymphadenopathy is noted about the head, neck              Assessment & Plan:    #1 urinary tract infection, recurrent. Past medical history residual urine retention based on cystoscopic exam by urologist.  The  importance of determining why she is having recurrent urinary tract infections was stressed, rather than simply treating these recurrences.

## 2012-01-05 NOTE — Patient Instructions (Signed)
Drink to thirst up to 40 ounces of water daily. Spicy foods or alcohol intake may aggravate symptoms 

## 2012-01-05 NOTE — Telephone Encounter (Signed)
Patient calling to speak with a nurse, she has questions to ask about her urine.  Patient states that quite often, from morning until around noon, her urine is very cloudy, and smells foul.  Please call patient at work phone.

## 2012-01-05 NOTE — Assessment & Plan Note (Signed)
Current status of diabetic control needs to be defined. Uncontrolled diabetes could result in recurrent urinary tract or other  infections.

## 2012-01-05 NOTE — Telephone Encounter (Signed)
Pt scheduled for OV.  

## 2012-01-07 ENCOUNTER — Other Ambulatory Visit: Payer: Self-pay | Admitting: Internal Medicine

## 2012-01-07 NOTE — Telephone Encounter (Signed)
OK X 6 months 

## 2012-01-07 NOTE — Telephone Encounter (Signed)
Rx sent 

## 2012-01-07 NOTE — Telephone Encounter (Signed)
Dr.Hopper please advise on labs, then med to be filled

## 2012-01-09 LAB — URINE CULTURE

## 2012-03-01 ENCOUNTER — Other Ambulatory Visit: Payer: Self-pay | Admitting: Internal Medicine

## 2012-03-21 ENCOUNTER — Ambulatory Visit (INDEPENDENT_AMBULATORY_CARE_PROVIDER_SITE_OTHER): Payer: BC Managed Care – PPO | Admitting: Family Medicine

## 2012-03-21 ENCOUNTER — Encounter: Payer: Self-pay | Admitting: Family Medicine

## 2012-03-21 VITALS — BP 128/78 | HR 105 | Temp 98.7°F | Ht 65.5 in | Wt 183.2 lb

## 2012-03-21 DIAGNOSIS — H10029 Other mucopurulent conjunctivitis, unspecified eye: Secondary | ICD-10-CM | POA: Insufficient documentation

## 2012-03-21 DIAGNOSIS — J302 Other seasonal allergic rhinitis: Secondary | ICD-10-CM

## 2012-03-21 DIAGNOSIS — J309 Allergic rhinitis, unspecified: Secondary | ICD-10-CM

## 2012-03-21 HISTORY — DX: Other seasonal allergic rhinitis: J30.2

## 2012-03-21 MED ORDER — POLYMYXIN B-TRIMETHOPRIM 10000-0.1 UNIT/ML-% OP SOLN: OPHTHALMIC | Status: DC

## 2012-03-21 NOTE — Assessment & Plan Note (Signed)
New.  Pt's sxs and PE consistent w/ bacterial conjunctivitis.  Start abx eye drops.  Reviewed supportive care and red flags that should prompt return.  Pt expressed understanding and is in agreement w/ plan.

## 2012-03-21 NOTE — Patient Instructions (Signed)
Start the eye drops in each eye for the bacterial infection Start Claritin or Zyrtec daily Try not to rub or itch your eyes Drink plenty of fluids Alternate tylenol and ibuprofen every 4 hrs as needed for pain Hang in there!

## 2012-03-21 NOTE — Progress Notes (Signed)
  Subjective:    Patient ID: Christine [name], female    DOB: Jul 19, 1951, 61 y.o.   MRN: [id number]  HPI URI- sxs started 1 week ago w/ sore throat.  Thought it was allergies.  Throat remains 'incredibly sore'.  No fevers.  Over the weekend developed eye drainage and matting- has gritty, irritated feeling.  sxs started in R eye, now moving to L.  + nasal congestion.   No facial pain.  No ear pain.  Mild cough.  + hx of seasonal allergies, not currently taking meds.   Review of Systems For ROS see HPI     Objective:   Physical Exam  Vitals reviewed. Constitutional: She appears well-developed and well-nourished. No distress.  HENT:  Head: Normocephalic and atraumatic.  Right Ear: Tympanic membrane normal.  Left Ear: Tympanic membrane normal.  Nose: Mucosal edema and rhinorrhea present. Right sinus exhibits no maxillary sinus tenderness and no frontal sinus tenderness. Left sinus exhibits no maxillary sinus tenderness and no frontal sinus tenderness.  Mouth/Throat: Mucous membranes are normal. Posterior oropharyngeal erythema (w/ PND) present.  Eyes: EOM are normal. Pupils are equal, round, and reactive to light. Right eye exhibits no discharge. Left eye exhibits no discharge.       Bilateral conjunctival injxn w/ limbic sparing, R>L  Neck: Normal range of motion. Neck supple.  Cardiovascular: Normal rate, regular rhythm and normal heart sounds.   Pulmonary/Chest: Effort normal and breath sounds normal. No respiratory distress. She has no wheezes. She has no rales.  Lymphadenopathy:    She has no cervical adenopathy.          Assessment & Plan:

## 2012-03-21 NOTE — Assessment & Plan Note (Signed)
Pt's sxs and PE consistent w/ uncontrolled allergies.  Start OTC antihistamines.  Reviewed supportive care and red flags that should prompt return.  Pt expressed understanding and is in agreement w/ plan.

## 2012-03-28 ENCOUNTER — Other Ambulatory Visit: Payer: Self-pay | Admitting: Internal Medicine

## 2012-05-09 ENCOUNTER — Other Ambulatory Visit: Payer: Self-pay | Admitting: Internal Medicine

## 2012-06-30 ENCOUNTER — Emergency Department (HOSPITAL_COMMUNITY): Payer: BC Managed Care – PPO

## 2012-06-30 ENCOUNTER — Encounter (HOSPITAL_COMMUNITY): Payer: Self-pay | Admitting: *Deleted

## 2012-06-30 ENCOUNTER — Emergency Department (HOSPITAL_COMMUNITY)
Admission: EM | Admit: 2012-06-30 | Discharge: 2012-06-30 | Disposition: A | Discharge status: Home / Self Care | Payer: BC Managed Care – PPO | Attending: Emergency Medicine | Admitting: Emergency Medicine

## 2012-06-30 DIAGNOSIS — I10 Essential (primary) hypertension: Secondary | ICD-10-CM | POA: Insufficient documentation

## 2012-06-30 DIAGNOSIS — S5011XA Contusion of right forearm, initial encounter: Secondary | ICD-10-CM

## 2012-06-30 DIAGNOSIS — R51 Headache: Secondary | ICD-10-CM | POA: Insufficient documentation

## 2012-06-30 DIAGNOSIS — E119 Type 2 diabetes mellitus without complications: Secondary | ICD-10-CM | POA: Insufficient documentation

## 2012-06-30 DIAGNOSIS — M79609 Pain in unspecified limb: Secondary | ICD-10-CM | POA: Insufficient documentation

## 2012-06-30 DIAGNOSIS — S0003XA Contusion of scalp, initial encounter: Secondary | ICD-10-CM | POA: Insufficient documentation

## 2012-06-30 DIAGNOSIS — R296 Repeated falls: Secondary | ICD-10-CM | POA: Insufficient documentation

## 2012-06-30 DIAGNOSIS — S52502A Unspecified fracture of the lower end of left radius, initial encounter for closed fracture: Secondary | ICD-10-CM

## 2012-06-30 DIAGNOSIS — E785 Hyperlipidemia, unspecified: Secondary | ICD-10-CM | POA: Insufficient documentation

## 2012-06-30 DIAGNOSIS — S52599A Other fractures of lower end of unspecified radius, initial encounter for closed fracture: Secondary | ICD-10-CM | POA: Insufficient documentation

## 2012-06-30 DIAGNOSIS — S0083XA Contusion of other part of head, initial encounter: Secondary | ICD-10-CM

## 2012-06-30 DIAGNOSIS — S1093XA Contusion of unspecified part of neck, initial encounter: Secondary | ICD-10-CM | POA: Insufficient documentation

## 2012-06-30 DIAGNOSIS — S5010XA Contusion of unspecified forearm, initial encounter: Secondary | ICD-10-CM | POA: Insufficient documentation

## 2012-06-30 DIAGNOSIS — Z79899 Other long term (current) drug therapy: Secondary | ICD-10-CM | POA: Insufficient documentation

## 2012-06-30 DIAGNOSIS — Z8673 Personal history of transient ischemic attack (TIA), and cerebral infarction without residual deficits: Secondary | ICD-10-CM | POA: Insufficient documentation

## 2012-06-30 DIAGNOSIS — M25539 Pain in unspecified wrist: Secondary | ICD-10-CM | POA: Insufficient documentation

## 2012-06-30 MED ORDER — HYDROCODONE-ACETAMINOPHEN 5-325 MG PO TABS: 1.0000 | ORAL_TABLET | ORAL | Status: AC | PRN

## 2012-06-30 MED ORDER — HYDROCODONE-ACETAMINOPHEN 5-325 MG PO TABS
1.0000 | ORAL_TABLET | Freq: Once | ORAL | Status: AC
  Administered 2012-06-30: 1 via ORAL
  Filled 2012-06-30: qty 1

## 2012-06-30 NOTE — ED Notes (Signed)
Patient transported to X-ray 

## 2012-06-30 NOTE — ED Provider Notes (Addendum)
History     CSN: [id number]  Arrival date & time 06/30/12  1904   First MD Initiated Contact with Patient 06/30/12 1954      Chief Complaint  Patient presents with  . Fall    (Consider location/radiation/quality/duration/timing/severity/associated sxs/prior treatment) HPI Comments: Patient was stepping over a dog gate and tripped and fell.  She did not lose consciousness.  She did hit her central upper for head where she has a hematoma and abrasion.  She also notes an abrasion over her nose.  She has no vision changes.  She can open and close her jaw without difficulty.  She has no chest pain or difficulty breathing.  She does have right forearm pain.  She also has left wrist pain and is left-hand dominant.  She has no weakness or numbness in her hand.  She notes an abrasion over her knee but has been able to ambulate without difficulty.  She's not on any anticoagulation beyond aspirin 81 mg daily.  Patient denies any nausea or vomiting.  The history is provided by the patient.    Past Medical History  Diagnosis Date  . Allergic rhinitis   . Sleep disorder     Dr Marylou Flesher  . Diabetes mellitus   . Migraine headache     quiescent  . TIA (transient ischemic attack)   . Hyperlipidemia   . HTN (hypertension)     Past Surgical History  Procedure Date  . Polypectomy 2002    benign, hyperplastic polyp; rectal bleeding 2008  . Total abdominal hysterectomy 1982    BSO for endometriosis  . Coronary angioplasty 1997    for chest pain- negative   . Tonsillectomy   . Wisdom tooth extraction     Family History  Problem Relation Age of Onset  . Colon cancer Maternal Aunt   . Diabetes Paternal Uncle   . Heart attack Father 63  . COPD Mother   . Lung cancer Brother   . Mental illness      niece, committed suicide.  [name] Heart attack      brother X 2; @ 50 & 52    History  Substance Use Topics  . Smoking status: Never Smoker   . Smokeless tobacco: Not on file  . Alcohol Use: No     OB History    Grav Para Term Preterm Abortions TAB SAB Ect Mult Living                  Review of Systems  Constitutional: Negative.  Negative for fever and chills.  HENT: Positive for facial swelling.   Eyes: Negative.  Negative for discharge and redness.  Respiratory: Negative.  Negative for cough and shortness of breath.   Cardiovascular: Negative.  Negative for chest pain.  Gastrointestinal: Negative.  Negative for nausea, vomiting, abdominal pain and diarrhea.  Genitourinary: Negative.   Musculoskeletal: Positive for joint swelling. Negative for back pain.  Skin: Positive for rash and wound. Negative for color change.  Neurological: Positive for headaches. Negative for syncope.  Hematological: Negative.  Negative for adenopathy.  Psychiatric/Behavioral: Negative.  Negative for confusion.  All other systems reviewed and are negative.    Allergies  Vioxx; Prednisone; and Colesevelam  Home Medications   Current Outpatient Rx  Name Route Sig Dispense Refill  . ASPIRIN 81 MG PO TABS Oral Take 81 mg by mouth daily.      . BUSPIRONE HCL 15 MG PO TABS Oral Take 15 mg by mouth 2 (two)  times daily.    [name] VITAMIN D3 5000 UNITS PO CAPS Oral Take 5,000 Units by mouth daily.     . CYCLOBENZAPRINE HCL 10 MG PO TABS Oral Take 10 mg by mouth at bedtime as needed. For muscle spasm.    . DESVENLAFAXINE SUCCINATE ER 50 MG PO TB24 Oral Take 50 mg by mouth daily.    . OMEGA-3 FATTY ACIDS 1000 MG PO CAPS Oral Take 1 g by mouth daily.     [name] JANUMET 50-500 MG PO TABS  TAKE 1 TABLET BY MOUTH TWICE A DAY WITH 2 LARGEST MEALS 60 tablet 5  . LORAZEPAM 0.5 MG PO TABS Oral Take 0.5 mg by mouth 2 (two) times daily as needed. For anxiety.    [name] LOSARTAN POTASSIUM 100 MG PO TABS  TAKE 1 TABLET BY MOUTH EVERY DAY 30 tablet 11  . OMEPRAZOLE 20 MG PO CPDR Oral Take 20 mg by mouth daily.    [name] PROPRANOLOL HCL 10 MG PO TABS Oral Take 10 mg by mouth 3 (three) times daily as needed. For palpitations.    [name]  VITAMIN B-12 250 MCG PO TABS Oral Take 250 mcg by mouth daily.      [name] VYTORIN 10-20 MG PO TABS  TAKE 1 TABLET BY MOUTH AT BEDTIME 90 tablet 2  . GLUCOSE BLOOD VI STRP  Check blood sugar once daily as directed DX: 250.00 100 each 3    Check blood sugar once daily as directed    BP 155/77  Pulse 114  Temp 98.9 F (37.2 C)  Resp 20  SpO2 96%  Physical Exam  Nursing note and vitals reviewed. Constitutional: She is oriented to person, place, and time. She appears well-developed and well-nourished.  Non-toxic appearance. She does not have a sickly appearance.  HENT:  Head: Normocephalic and atraumatic.         [name]  No tenderness to palpation over her orbital bones. No nasal bone deformity.  Patient can open and close her jaw without difficulty.  She states that she feels her teeth line up normally.o palpation over the bones  Eyes: Conjunctivae, EOM and lids are normal. Pupils are equal, round, and reactive to light. No scleral icterus.  Neck: Trachea normal and normal range of motion. Neck supple.  Cardiovascular: Normal rate, regular rhythm and normal heart sounds.  Exam reveals no gallop and no friction rub.   No murmur heard. Pulmonary/Chest: Effort normal and breath sounds normal. No respiratory distress. She has no wheezes. She has no rales.  Abdominal: Soft. Normal appearance. There is no tenderness. There is no rebound, no guarding and no CVA tenderness.  Musculoskeletal: Normal range of motion.       Palpable radial pulses bilaterally.  Capillary refill in her fingertips less than 2 seconds.  He should has sensation to light touch intact in both hands.  She does have tenderness to palpation over her right distal forearm but not over her wrist or hand.  No tenderness over either shoulder or elbows.  She does have swelling present over her left dorsal wrist with tenderness to palpation there.  No snuffbox tenderness.  No C-spine tenderness on exam  Neurological: She is alert and oriented  to person, place, and time. She has normal strength.  Skin: Skin is warm, dry and intact. No rash noted.       Patient has an abrasion over her right anterior knee.  Psychiatric: She has a normal mood and affect. Her behavior is normal. Judgment  and thought content normal.    ED Course  Procedures (including critical care time)  Results for orders placed in visit on 01/05/12  URINE CULTURE      Component Value Range   Culture       Value: ESCHERICHIA COLI     KLEBSIELLA PNEUMONIAE   Colony Count >=100,000 COLONIES/ML     Organism ID, Bacteria ESCHERICHIA COLI     Organism ID, Bacteria KLEBSIELLA PNEUMONIAE    POCT URINALYSIS DIPSTICK      Component Value Range   Color, UA yellow     Clarity, UA clear     Glucose, UA neg     Bilirubin, UA neg     Ketones, UA neg     Spec Grav, UA       Blood, UA small     pH, UA 5.0     Protein, UA neg     Urobilinogen, UA 0.2     Nitrite, UA neg     Leukocytes, UA moderate (2+)    HEMOGLOBIN A1C      Component Value Range   Hemoglobin A1C 6.8 (*) 4.6 - 6.5 %   Dg Forearm Right  06/30/2012  *RADIOLOGY REPORT*  Clinical Data: Fall, wrist pain  RIGHT FOREARM - 2 VIEW  Comparison: None  Findings: No fracture identified.  No radiopaque foreign body.  No soft tissue abnormality.  IMPRESSION: No acute fracture of the right radius or ulna.  Original Report Authenticated By: Harrel Lemon, M.D.   Dg Wrist Complete Left  06/30/2012  *RADIOLOGY REPORT*  Clinical Data: Fall, left posterior wrist pain  LEFT WRIST - COMPLETE 3+ VIEW  Comparison: None.  Findings: There is a minimally impacted distal left radial metaphyseal fracture without apparent intra-articular extension. Mild surrounding soft tissue swelling.  No radiopaque foreign body. Carpal rows are well-aligned.  IMPRESSION: Minimally impacted distal left radial metadiaphyseal fracture.  Original Report Authenticated By: Harrel Lemon, M.D.   Ct Head Wo Contrast  06/30/2012  *RADIOLOGY  REPORT*  Clinical Data: Fall.  Swelling to forehead and abrasions to nose.  CT HEAD WITHOUT CONTRAST  Technique:  Contiguous axial images were obtained from the base of the skull through the vertex without contrast.  Comparison: Report of CT head 06/08/2003 and 10/24/2001.  Findings: An area of focal encephalomalacia in the right frontal lobe has been reported previously.  No acute cortical infarct, hemorrhage, mass lesion is present.  The ventricles are of normal size.  No significant extra-axial fluid collection is present.  A left paramidline frontal scalp hematoma is present.  There is no underlying fracture.  There is soft tissue swelling over the bridge of the nose as well.  Minimally-displaced nasal bone fractures are present bilaterally.  The nasal septum is intact.  The visualized maxillary bones are intact.  The paranasal sinuses and mastoid air cells are clear.  IMPRESSION:  1.  Minimally-displaced bilateral nasal bone fractures. 2.  Left paramidline frontal scalp hematoma without underlying fracture. 3.  Focal encephalomalacia versus congenital sulcal prominence in the right frontal lobe has been reported previously and is likely stable.  Original Report Authenticated By: Jamesetta Orleans. MATTERN, M.D.   Dg Hand Complete Left  06/30/2012  *RADIOLOGY REPORT*  Clinical Data: Fall.  Left hand pain.  LEFT HAND - COMPLETE 3+ VIEW  Comparison: Left wrist films from the same day.  Findings: A nondisplaced distal radius fracture is again noted. Moderate osteopenia is evident.  Mild degenerative changes are noted in  the DIP joints and at the first [facility] joint.  No additional fractures are present.  IMPRESSION:  1.  Moderate osteopenia. 2.  Nondisplaced distal radius fracture. 3.  Mild osteoarthritis.  Original Report Authenticated By: Jamesetta Orleans. MATTERN, M.D.      MDM  Patient does have a left distal radius fracture with no significant displacement.  She will be placed in a sugar tong splint with  baseline.  She will require orthopedic hand followup for further evaluation as well.  She is neurovascularly intact.  She has contusion over her right arm.  Given the patient's large hematoma on her for head she is receiving a CT of her head.  If this is normal I anticipate the patient's discharge home with followup with orthopedics.  Her tetanus immunization is up-to-date      Nat Christen, MD 06/30/12 2055  Patient has had a splint placed on her left arm and has good cap refill movement in her fingers with the splint in place.  She is maintaining her normal mental status.  She has a normal head CT.  She has been seen by Dr. Jillyn Hidden from orthopedics previously so would like to continue with that group and we'll see Dr. Melvyn Novas there is a hand specialist if needed.  Patient will receive Norco for pain medication at home as well and I have discussed stool softeners with her as constipation is a side effect.  Nat Christen, MD 06/30/12 2231

## 2012-06-30 NOTE — ED Notes (Signed)
Pt stepping over a dog gate; tripped and fell landing on face.  Presents with swelling to forehead/abrasions to nose.  Pain to right arm.  Denies neck pain;  Denies loc.

## 2012-07-12 ENCOUNTER — Other Ambulatory Visit: Payer: Self-pay | Admitting: Internal Medicine

## 2012-07-12 NOTE — Telephone Encounter (Signed)
A1C 250.00 

## 2012-07-19 ENCOUNTER — Other Ambulatory Visit: Payer: Self-pay | Admitting: Internal Medicine

## 2012-07-23 ENCOUNTER — Other Ambulatory Visit: Payer: Self-pay | Admitting: Internal Medicine

## 2012-07-25 NOTE — Telephone Encounter (Signed)
I called pharmacy and heather stated this was sent to [place] in error, patient picked up rx already that was approved on the 6th

## 2012-08-17 ENCOUNTER — Other Ambulatory Visit: Payer: Self-pay | Admitting: Internal Medicine

## 2012-08-24 ENCOUNTER — Other Ambulatory Visit: Payer: Self-pay | Admitting: General Practice

## 2012-08-24 ENCOUNTER — Other Ambulatory Visit: Payer: Self-pay | Admitting: Internal Medicine

## 2012-08-24 MED ORDER — CYCLOBENZAPRINE HCL 10 MG PO TABS: 10.0000 mg | ORAL_TABLET | Freq: Every evening | ORAL | Status: DC | PRN

## 2012-08-29 ENCOUNTER — Other Ambulatory Visit: Payer: Self-pay | Admitting: Internal Medicine

## 2012-08-30 NOTE — Telephone Encounter (Signed)
Will need fasting labs before further refills

## 2012-09-16 ENCOUNTER — Other Ambulatory Visit: Payer: Self-pay | Admitting: Internal Medicine

## 2012-09-27 ENCOUNTER — Other Ambulatory Visit: Payer: Self-pay | Admitting: Internal Medicine

## 2012-10-11 ENCOUNTER — Encounter: Payer: Self-pay | Admitting: Internal Medicine

## 2012-10-11 ENCOUNTER — Ambulatory Visit (INDEPENDENT_AMBULATORY_CARE_PROVIDER_SITE_OTHER): Payer: 59 | Admitting: Internal Medicine

## 2012-10-11 VITALS — BP 120/84 | HR 90 | Temp 98.1°F | Resp 12 | Ht 66.0 in | Wt 184.8 lb

## 2012-10-11 DIAGNOSIS — K219 Gastro-esophageal reflux disease without esophagitis: Secondary | ICD-10-CM

## 2012-10-11 DIAGNOSIS — E785 Hyperlipidemia, unspecified: Secondary | ICD-10-CM

## 2012-10-11 DIAGNOSIS — I10 Essential (primary) hypertension: Secondary | ICD-10-CM

## 2012-10-11 DIAGNOSIS — E119 Type 2 diabetes mellitus without complications: Secondary | ICD-10-CM

## 2012-10-11 DIAGNOSIS — IMO0001 Reserved for inherently not codable concepts without codable children: Secondary | ICD-10-CM

## 2012-10-11 DIAGNOSIS — Z8739 Personal history of other diseases of the musculoskeletal system and connective tissue: Secondary | ICD-10-CM

## 2012-10-11 DIAGNOSIS — M797 Fibromyalgia: Secondary | ICD-10-CM

## 2012-10-11 DIAGNOSIS — Z0000 Encounter for general adult medical examination without abnormal findings: Secondary | ICD-10-CM

## 2012-10-11 DIAGNOSIS — E559 Vitamin D deficiency, unspecified: Secondary | ICD-10-CM

## 2012-10-11 DIAGNOSIS — Z23 Encounter for immunization: Secondary | ICD-10-CM

## 2012-10-11 HISTORY — DX: Personal history of other diseases of the musculoskeletal system and connective tissue: Z87.39

## 2012-10-11 LAB — CBC WITH DIFFERENTIAL/PLATELET
Basophils Absolute: 0 10*3/uL (ref 0.0–0.1)
Eosinophils Relative: 0.9 % (ref 0.0–5.0)
Lymphocytes Relative: 28.6 % (ref 12.0–46.0)
Lymphs Abs: 1.6 10*3/uL (ref 0.7–4.0)
Monocytes Relative: 6.8 % (ref 3.0–12.0)
Neutrophils Relative %: 63.4 % (ref 43.0–77.0)
Platelets: 233 10*3/uL (ref 150.0–400.0)
RDW: 13.8 % (ref 11.5–14.6)
WBC: 5.7 10*3/uL (ref 4.5–10.5)

## 2012-10-11 LAB — LIPID PANEL
HDL: 42.9 mg/dL (ref >39.00)
LDL Cholesterol: 95 mg/dL (ref 0–99)
VLDL: 28 mg/dL (ref 0.0–40.0)

## 2012-10-11 LAB — HEPATIC FUNCTION PANEL
AST: 60 U/L — ABNORMAL HIGH (ref 0–37)
Albumin: 4.1 g/dL (ref 3.5–5.2)
Alkaline Phosphatase: 49 U/L (ref 39–117)
Total Bilirubin: 0.4 mg/dL (ref 0.3–1.2)

## 2012-10-11 LAB — BASIC METABOLIC PANEL
BUN: 15 mg/dL (ref 6–23)
Calcium: 9.2 mg/dL (ref 8.4–10.5)
Creatinine, Ser: 0.7 mg/dL (ref 0.4–1.2)
GFR: 86.85 mL/min (ref >60.00)
Glucose, Bld: 169 mg/dL — ABNORMAL HIGH (ref 70–99)
Potassium: 4 mEq/L (ref 3.5–5.1)

## 2012-10-11 LAB — TSH: TSH: 1.43 u[IU]/mL (ref 0.35–5.50)

## 2012-10-11 LAB — HEMOGLOBIN A1C: Hgb A1c MFr Bld: 7.3 % — ABNORMAL HIGH (ref 4.6–6.5)

## 2012-10-11 MED ORDER — OMEPRAZOLE 20 MG PO CPDR: 20.0000 mg | DELAYED_RELEASE_CAPSULE | Freq: Every day | ORAL | Status: DC

## 2012-10-11 MED ORDER — EZETIMIBE-SIMVASTATIN 10-20 MG PO TABS: ORAL_TABLET | ORAL | Status: DC

## 2012-10-11 MED ORDER — CYCLOBENZAPRINE HCL 10 MG PO TABS: 10.0000 mg | ORAL_TABLET | Freq: Every evening | ORAL | Status: DC | PRN

## 2012-10-11 MED ORDER — LOSARTAN POTASSIUM 100 MG PO TABS: ORAL_TABLET | ORAL | Status: DC

## 2012-10-11 MED ORDER — SITAGLIPTIN PHOS-METFORMIN HCL 50-500 MG PO TABS: ORAL_TABLET | ORAL | Status: DC

## 2012-10-11 NOTE — Patient Instructions (Addendum)
Preventive Health Care: Exercise  30-45  minutes a day, 3-4 days a week. Walking is especially valuable in preventing Osteoporosis. Eat a low-fat diet with lots of fruits and vegetables, up to 7-9 servings per day.  Consume less than 30 grams of sugar per day from foods & drinks with High Fructose Corn Syrup as #1,2,3 or #4 on label.  If you activate My Chart; the results can be released to you as soon as they populate from the lab. If you choose not to use this program; the labs have to be reviewed, copied & mailed   causing a delay in getting the results to you.  

## 2012-10-11 NOTE — Progress Notes (Signed)
  Subjective:    Patient ID: Christine [name], female    DOB: May 15, 1951, 61 y.o.   MRN: [id number]  HPI   Mrs. Escareno is here for a physical;acute issues include migraine recurrence with weather changes.These are resolving.      Review of Systems HYPERTENSION: Disease Monitoring: Blood pressure range- 120-130/60-70s  Chest pain, palpitations- no      Dyspnea- no Medications: Compliance- yes  Lightheadedness,Syncope- no    Edema- no  DIABETES: Disease Monitoring: Blood Sugar ranges-130-160  Polyuria/phagia/dipsia- no       Visual problems- no Medications: Compliance- yes Hypoglycemic symptoms- no  HYPERLIPIDEMIA: Disease Monitoring: See symptoms for Hypertension Medications: Compliance- yes  Abd pain, bowel changes- no   Muscle aches- fibromyalgia off Flexeril              Objective:   Physical Exam  Gen.: well-nourished in appearance. Alert, appropriate and cooperative throughout exam. Head: Normocephalic without obvious abnormalities Eyes: No corneal or conjunctival inflammation noted. Pupils equal round reactive to light and accommodation. Fundal exam is benign without hemorrhages, exudate, papilledema. Extraocular motion intact. Vision grossly normal. Ears: External  ear exam reveals no significant lesions or deformities. Canals clear .TMs normal. Hearing is grossly normal bilaterally. Nose: External nasal exam reveals no deformity or inflammation. Nasal mucosa are pink and moist. No lesions or exudates noted.   Mouth: Oral mucosa and oropharynx reveal no lesions or exudates. Teeth in good repair. Neck: No deformities, masses, or tenderness noted. Range of motion & Thyroid  normal. Lungs: Normal respiratory effort; chest expands symmetrically. Lungs are clear to auscultation without rales, wheezes, or increased work of breathing. Heart: Normal rate and rhythm. Normal S1 and S2. No gallop, click, or rub. Grade 1/6 systolic murmur  Abdomen: Bowel sounds  normal; abdomen soft and nontender. No masses, organomegaly or hernias noted. Genitalia: [name]                                                               Musculoskeletal/extremities: No deformity or scoliosis noted of  the thoracic or lumbar spine. No clubbing, cyanosis, edema, or deformity noted. Range of motion  normal .Tone & strength  normal.Joints normal. Nail health  good. Vascular: Carotid, radial artery, dorsalis pedis and  posterior tibial pulses are full and equal. No bruits present. Neurologic: Alert and oriented x3. Deep tendon reflexes symmetrical and normal.          Skin: Intact without suspicious lesions or rashes. Wart OS medially Lymph: No cervical, axillary lymphadenopathy present. Psych: Mood and affect are normal. Normally interactive                                                                                         Assessment & Plan:  #1 comprehensive physical exam; no acute findings  Plan: see Orders

## 2012-10-16 LAB — VITAMIN D 1,25 DIHYDROXY: Vitamin D 1, 25 (OH)2 Total: 62 pg/mL (ref 18–72)

## 2012-11-01 LAB — HM DEXA SCAN

## 2012-11-08 ENCOUNTER — Encounter: Payer: Self-pay | Admitting: Internal Medicine

## 2012-11-17 ENCOUNTER — Ambulatory Visit (INDEPENDENT_AMBULATORY_CARE_PROVIDER_SITE_OTHER): Payer: 59 | Admitting: Internal Medicine

## 2012-11-17 ENCOUNTER — Encounter: Payer: Self-pay | Admitting: Internal Medicine

## 2012-11-17 VITALS — BP 128/80 | HR 96 | Wt 188.6 lb

## 2012-11-17 DIAGNOSIS — E559 Vitamin D deficiency, unspecified: Secondary | ICD-10-CM

## 2012-11-17 DIAGNOSIS — Z8739 Personal history of other diseases of the musculoskeletal system and connective tissue: Secondary | ICD-10-CM

## 2012-11-17 MED ORDER — GLUCOSE BLOOD VI STRP: ORAL_STRIP | Status: DC

## 2012-11-17 MED ORDER — ALENDRONATE SODIUM 70 MG PO TABS: 70.0000 mg | ORAL_TABLET | ORAL | Status: DC

## 2012-11-17 NOTE — Patient Instructions (Addendum)
Recommended lifestyle interventions for Osteoporosis include calcium 600 mg twice a day  & vitamin D3 supplementation to keep vit D  level @ least 40-60.  Also weight bearing exercise such as  walking 30-45 minutes 3-4  X per week is recommended. BMD every 25 mos

## 2012-11-17 NOTE — Progress Notes (Signed)
  Subjective:    Patient ID: OYUKI HOGAN, female    DOB: 1951/09/05, 61 y.o.   MRN: [id number]  HPI She is here to review the results of her bone density study done 11/01/12. This reveals significant osteopenia in the spine with a T score of -2.1. There's been an 8.5% bone loss in the spine since 2010. There has actually been stability to slight improvement in the hips. She has significant osteopenia at the right femoral neck with a T score of -1.7.  Her significant past history of fractures was updated. There is no family history of osteoporosis.  Her vitamin D level was therapeutic in October of this year. Her calcium levels are normal. She does not supplement calcium as she's had some GI intolerance to supplements.  She has never taken any bone building therapy.    Review of Systems She describes some difficulty swallowing pills. She denies any dysphagia with food. She is on a protein pump inhibitor. She has no history of upper endoscopy.     Objective:   Physical Exam Gen.:  well-nourished in appearance. Alert, appropriate and cooperative throughout exam.  Mouth: Oral mucosa and oropharynx reveal no lesions or exudates. Teeth in good repair. Neck: No deformities, masses, or tenderness noted. Range of motion normal.                                                                                  Musculoskeletal/extremities: No deformity or scoliosis noted of  the thoracic or lumbar spine. No clubbing, cyanosis, edema, or deformity noted. Range of motion  normal .Tone & strength  normal.Joints normal. Nail health  Good. Mild crepitus of nails Lymph: No cervical, axillary lymphadenopathy present. Psych: Mood and affect are normal. Normally interactive                                                                                         Assessment & Plan:

## 2012-11-17 NOTE — Assessment & Plan Note (Signed)
Bone mineral density reviewed. Risk and options discussed. She would benefit from an exercise program. She is young enough that bisphosphonates are still an option. She does have some pill dysphagia. Appropriate administration of the agent would be stressed.

## 2012-12-02 ENCOUNTER — Encounter: Payer: Self-pay | Admitting: Internal Medicine

## 2013-03-03 ENCOUNTER — Encounter: Payer: Self-pay | Admitting: Internal Medicine

## 2013-03-03 ENCOUNTER — Ambulatory Visit (INDEPENDENT_AMBULATORY_CARE_PROVIDER_SITE_OTHER): Payer: 59 | Admitting: Internal Medicine

## 2013-03-03 VITALS — BP 118/70 | HR 110 | Temp 98.1°F | Resp 14 | Wt 188.0 lb

## 2013-03-03 DIAGNOSIS — J209 Acute bronchitis, unspecified: Secondary | ICD-10-CM

## 2013-03-03 DIAGNOSIS — J069 Acute upper respiratory infection, unspecified: Secondary | ICD-10-CM

## 2013-03-03 DIAGNOSIS — R21 Rash and other nonspecific skin eruption: Secondary | ICD-10-CM

## 2013-03-03 MED ORDER — MOMETASONE FUROATE 0.1 % EX OINT: TOPICAL_OINTMENT | Freq: Two times a day (BID) | CUTANEOUS | Status: DC

## 2013-03-03 MED ORDER — HYDROCODONE-HOMATROPINE 5-1.5 MG/5ML PO SYRP: 5.0000 mL | ORAL_SOLUTION | Freq: Four times a day (QID) | ORAL | Status: DC | PRN

## 2013-03-03 MED ORDER — DOXYCYCLINE HYCLATE 100 MG PO TABS: 100.0000 mg | ORAL_TABLET | Freq: Two times a day (BID) | ORAL | Status: DC

## 2013-03-03 NOTE — Progress Notes (Signed)
  Subjective:    Patient ID: Christine [name], female    DOB: Apr 17, 1951, 62 y.o.   MRN: [id number]  HPI The respiratory tract symptoms began 02/26/13 as sore throat & pruritic R neck rash. Laryngitis as of 3/19.Fever to 101 on 3/19. Chest congestion with scant clear - yellow sputum as of 3/20.  No exposures reported  to sick family or friends. Significant active  associated symptoms include scant nasal purulence,  Cough is not  associated with wheezing  & dyspnea reported.  Her husband noticed a pruritic rash over the left posterior thigh today     Flu shot current   Treatment with  Delsym &  NSAIDS was minimally effective. There is no history of asthma ,  seasonal or perennial allergies.  The patient had never smoked                     Review of Systems  Symptoms not present include frontal headache,  facial pain, dental pain, earache, &  otic discharge. Chills, sweats were not present . Itchy/  watery eyes &  sneezing were not noted. Myalgias and arthralgias were not present.     Objective:   Physical Exam  General appearance:good health ;well nourished; no acute distress or increased work of breathing is present.  No  lymphadenopathy about the head, neck, or axilla noted.   Eyes: No conjunctival inflammation or lid edema is present. There is no scleral icterus.  Ears:  External ear exam shows no significant lesions or deformities.  Otoscopic examination reveals clear canals, tympanic membranes are intact bilaterally without bulging, retraction, inflammation or discharge.  Nose:  External nasal examination shows no deformity or inflammation. Nasal mucosa are pink and moist without lesions or exudates. No septal dislocation or deviation.No obstruction to airflow. Hyponasal speech   Oral exam: Dental hygiene is good; lips and gums are healthy appearing.There is no oropharyngeal erythema or exudate noted. Markedly hoarse.  Neck:  No deformities,  masses, or  tenderness noted.   Supple with full range of motion without pain.   Heart:  S4 gallop with flow murmur and regular rhythm. S1 and S2 normal without gallop, murmur, click, rub or other extra sounds.   Lungs:Chest clear to auscultation; no wheezes, rhonchi,rales ,or rubs present.No increased work of breathing.  Rattly cough  Extremities:  No cyanosis, edema, or clubbing  noted    Skin: Warm & dry . Dry, slightly rough erythematous patch right neck. Faintly erythematous rash over the left posterior thigh which blanches with pressure .      Assessment & Plan:

## 2013-03-03 NOTE — Patient Instructions (Addendum)
Stay well hydrated. Drink to thirst up to 40 ounces of fluids daily. 

## 2013-03-08 ENCOUNTER — Encounter: Payer: Self-pay | Admitting: Internal Medicine

## 2013-03-10 ENCOUNTER — Other Ambulatory Visit: Payer: Self-pay | Admitting: Internal Medicine

## 2013-03-10 NOTE — Telephone Encounter (Signed)
OK X 1 OVINB 

## 2013-03-10 NOTE — Telephone Encounter (Signed)
Hopp please advise  

## 2013-09-12 ENCOUNTER — Encounter: Payer: Self-pay | Admitting: Cardiovascular Disease

## 2013-10-19 ENCOUNTER — Other Ambulatory Visit: Payer: Self-pay

## 2013-10-28 ENCOUNTER — Other Ambulatory Visit: Payer: Self-pay | Admitting: Internal Medicine

## 2013-10-30 NOTE — Telephone Encounter (Signed)
Omeprazole and Janumet refilled per protocol

## 2013-11-01 ENCOUNTER — Other Ambulatory Visit: Payer: Self-pay | Admitting: Internal Medicine

## 2013-11-02 NOTE — Telephone Encounter (Signed)
Losartan refilled per protocol 

## 2013-11-06 ENCOUNTER — Telehealth: Payer: Self-pay

## 2013-11-06 NOTE — Telephone Encounter (Signed)
Medication and allergies: reviewed and updated  90 day supply/mail order: na Local pharmacy: [facility] [address]   Immunizations due:  Admin flu vaccine upon arrival  A/P:   No changes to FH or PSH Dexa 11/01/2012  Health Maintenance  Topic Date Due  . Pap Smear  06/03/1967  . Zostavax  06/02/2009  . Influenza Vaccine  07/14/2013  . Mammogram  11/01/2014  . Colonoscopy  11/02/2017  . Tetanus/tdap  06/08/2019   To Discuss with Provider: Not at this time

## 2013-11-06 NOTE — Telephone Encounter (Signed)
Left message for call back  identifiable     

## 2013-11-07 ENCOUNTER — Ambulatory Visit (INDEPENDENT_AMBULATORY_CARE_PROVIDER_SITE_OTHER): Payer: Managed Care, Other (non HMO) | Admitting: Internal Medicine

## 2013-11-07 ENCOUNTER — Encounter: Payer: Self-pay | Admitting: Internal Medicine

## 2013-11-07 VITALS — BP 149/85 | HR 102 | Temp 98.0°F | Resp 13 | Ht 65.75 in | Wt 181.8 lb

## 2013-11-07 DIAGNOSIS — Z0000 Encounter for general adult medical examination without abnormal findings: Secondary | ICD-10-CM

## 2013-11-07 DIAGNOSIS — Z23 Encounter for immunization: Secondary | ICD-10-CM

## 2013-11-07 DIAGNOSIS — IMO0001 Reserved for inherently not codable concepts without codable children: Secondary | ICD-10-CM

## 2013-11-07 DIAGNOSIS — M797 Fibromyalgia: Secondary | ICD-10-CM

## 2013-11-07 DIAGNOSIS — E785 Hyperlipidemia, unspecified: Secondary | ICD-10-CM

## 2013-11-07 DIAGNOSIS — Z8739 Personal history of other diseases of the musculoskeletal system and connective tissue: Secondary | ICD-10-CM

## 2013-11-07 LAB — HEPATIC FUNCTION PANEL
ALT: 78 U/L — ABNORMAL HIGH (ref 0–35)
AST: 62 U/L — ABNORMAL HIGH (ref 0–37)
Albumin: 4 g/dL (ref 3.5–5.2)
Alkaline Phosphatase: 48 U/L (ref 39–117)
Total Bilirubin: 0.5 mg/dL (ref 0.3–1.2)
Total Protein: 7.5 g/dL (ref 6.0–8.3)

## 2013-11-07 LAB — MICROALBUMIN / CREATININE URINE RATIO
Creatinine,U: 90.9 mg/dL
Microalb Creat Ratio: 2.9 mg/g (ref 0.0–30.0)
Microalb, Ur: 2.6 mg/dL — ABNORMAL HIGH (ref 0.0–1.9)

## 2013-11-07 LAB — CBC WITH DIFFERENTIAL/PLATELET
Basophils Relative: 0.4 % (ref 0.0–3.0)
Eosinophils Relative: 1.8 % (ref 0.0–5.0)
HCT: 36.1 % (ref 36.0–46.0)
Hemoglobin: 12.1 g/dL (ref 12.0–15.0)
Lymphocytes Relative: 36.1 % (ref 12.0–46.0)
Lymphs Abs: 1.8 10*3/uL (ref 0.7–4.0)
MCV: 83.4 fl (ref 78.0–100.0)
Monocytes Absolute: 0.4 10*3/uL (ref 0.1–1.0)
Monocytes Relative: 8 % (ref 3.0–12.0)
Neutro Abs: 2.6 10*3/uL (ref 1.4–7.7)
Neutrophils Relative %: 53.7 % (ref 43.0–77.0)
Platelets: 188 10*3/uL (ref 150.0–400.0)
RBC: 4.33 Mil/uL (ref 3.87–5.11)
WBC: 4.9 10*3/uL (ref 4.5–10.5)

## 2013-11-07 LAB — LIPID PANEL: Cholesterol: 170 mg/dL (ref 0–200)

## 2013-11-07 LAB — BASIC METABOLIC PANEL
BUN: 14 mg/dL (ref 6–23)
Chloride: 101 mEq/L (ref 96–112)
Creatinine, Ser: 0.6 mg/dL (ref 0.4–1.2)
Potassium: 3.8 mEq/L (ref 3.5–5.1)
Sodium: 135 mEq/L (ref 135–145)

## 2013-11-07 LAB — TSH: TSH: 2.04 u[IU]/mL (ref 0.35–5.50)

## 2013-11-07 MED ORDER — LOSARTAN POTASSIUM 100 MG PO TABS: ORAL_TABLET | ORAL | Status: DC

## 2013-11-07 MED ORDER — OMEPRAZOLE 20 MG PO CPDR: 20.0000 mg | DELAYED_RELEASE_CAPSULE | Freq: Every day | ORAL | Status: DC

## 2013-11-07 MED ORDER — CYCLOBENZAPRINE HCL 10 MG PO TABS: 10.0000 mg | ORAL_TABLET | Freq: Every evening | ORAL | Status: DC | PRN

## 2013-11-07 NOTE — Patient Instructions (Addendum)
Your next office appointment will be determined based upon review of your pending labs . Those instructions will be transmitted to you through My Chart  .  Eat a low-fat diet with lots of fruits and vegetables, up to 7-9 servings per day. Consume less than  30  Grams (preferably ZERO) of sugar per day from foods & drinks with High Fructose Corn Syrup (HFCS) sugar as #1,2,3 or # 4 on label.Follow a  low carb nutrition program such as [place] or [employer]  to prevent Diabetes progression . White carbohydrates (potatoes, rice, bread, and pasta) have a high spike of sugar and a high load of sugar. For example a  baked potato has a cup of sugar and a  french fry  2 teaspoons of sugar. Yams, wild  rice, whole grained bread &  wheat pasta have been much lower spike and load of  sugar. Portions should be the size of a deck of cards or your palm. Minimal Blood Pressure Goal= AVERAGE < 140/90;  Ideal is an AVERAGE < 135/85. This AVERAGE should be calculated from @ least 5-7 BP readings taken @ different times of day on different days of week. You should not respond to isolated BP readings , but rather the AVERAGE for that week .Please bring your  blood pressure cuff to office visits to verify that it is reliable.It  can also be checked against the blood pressure device at the pharmacy. Finger or wrist cuffs are not dependable; an arm cuff is. Reflux of gastric acid may be asymptomatic as this may occur mainly during sleep.The triggers for reflux  include stress; the "aspirin family" ; alcohol; peppermint; and caffeine (coffee, tea, cola, and chocolate). The aspirin family would include aspirin and the nonsteroidal agents such as ibuprofen &  Naproxen. Tylenol would not cause reflux. If having symptoms ; food & drink should be avoided for @ least 2 hours before going to bed.   Protein pump inhibitors should be taken daily for only up to 8 weeks and then as needed only. Long-term use may exacerbate the risk  of osteopenia or bone thinning.

## 2013-11-07 NOTE — Progress Notes (Signed)
Subjective:    Patient ID: Christine [name], female    DOB: [birth year], 62 y.o.   MRN: [id number]  HPI Christine [name] is here for a physical;acute issues include poorly controlled DM.  Fasting or morning glucose range 200-220. Highest glucose 2 hours after any meal is 300. No hypoglycemia reported                                                                                                                 Regular exercise described as 5 times per week for 10-15 minutes. No specific nutrition/diet followed Medication compliance is good. No medication adverse effects noted. Eye exam current; no retinopathy Foot care not current     Review of Systems No excess thirst ;  excess hunger ; or excess urination reported                              No lightheadedness with standing reported No chest pain or claudication described .   Palpitations with panic attacks.                                                                                                                          No non healing skin  ulcers or sores of extremities noted. No numbness or tingling or burning in feet described                                                                                                                                             No significant change in weight . No blurred,double, or loss of vision reported  .           Objective:   Physical Exam Gen.: Healthy and well-nourished in appearance. Alert, appropriate and cooperative throughout exam.Appears younger than stated age  Head: Normocephalic without obvious abnormalities; hair fine  Eyes: No corneal or conjunctival inflammation noted. Pupils equal round reactive to light and accommodation. Extraocular motion intact.  Ears: External  ear exam reveals no significant lesions or deformities. Canals clear .TMs normal.  Nose: External nasal exam reveals no deformity or inflammation. Nasal mucosa are pink and moist. No lesions or exudates noted.  Septum slightly to R  Mouth: Oral mucosa and oropharynx reveal no lesions or exudates. Teeth in good repair. Neck: No deformities, masses, or tenderness noted. Range of motion & Thyroid normal. Lungs: Normal respiratory effort; chest expands symmetrically. Lungs are clear to auscultation without rales, wheezes, or increased work of breathing. Heart: Normal rate and rhythm. Normal S1 and S2. No gallop, click, or rub. S4 w/o murmur. Abdomen: Bowel sounds normal; abdomen soft and nontender. No masses, organomegaly or hernias noted. Genitalia:  as per Gyn                                  Musculoskeletal/extremities: No deformity or scoliosis noted of  the thoracic or lumbar spine.  No clubbing, cyanosis, edema, or significant extremity  deformity noted. Range of motion normal .Tone & strength normal. Hand joints  reveal mild  flexion changes. Fingernail / toenail health good. Able to lie down & sit up w/o help. Negative SLR bilaterally to 90 degrees Vascular: Carotid, radial artery, dorsalis pedis and  posterior tibial pulses are full and equal. No bruits present. Neurologic: Alert and oriented x3. Deep tendon reflexes symmetrical and normal. Tremor of hands. Light touch normal over feet.      Skin: Intact without suspicious lesions or rashes. Lymph: No cervical, axillary lymphadenopathy present. Psych: Mood and affect are normal. Normally interactive                                                                                        Assessment & Plan:  #1 comprehensive physical exam; no acute findings #2 DM , ? control  Plan: see Orders  & Recommendations

## 2013-11-07 NOTE — Progress Notes (Signed)
Pre visit review using our clinic review tool, if applicable. No additional management support is needed unless otherwise documented below in the visit note. 

## 2013-11-08 ENCOUNTER — Other Ambulatory Visit: Payer: Self-pay | Admitting: *Deleted

## 2013-11-08 MED ORDER — GLUCOSE BLOOD VI STRP: ORAL_STRIP | Status: DC

## 2013-11-08 MED ORDER — ONETOUCH ULTRASOFT LANCETS MISC: Status: DC

## 2013-11-08 MED ORDER — ATORVASTATIN CALCIUM 20 MG PO TABS: 20.0000 mg | ORAL_TABLET | Freq: Every day | ORAL | Status: DC

## 2013-11-10 ENCOUNTER — Encounter: Payer: Self-pay | Admitting: Internal Medicine

## 2013-11-16 ENCOUNTER — Telehealth: Payer: Self-pay | Admitting: *Deleted

## 2013-11-16 ENCOUNTER — Ambulatory Visit: Payer: Managed Care, Other (non HMO)

## 2013-11-16 NOTE — Telephone Encounter (Signed)
Pecola Lawless, MD [place], RN            Start with 12 units of Levimir insulin @ the same time every day.  Average your fasting sugars each week; increase the daily insulin dose by 2 units each day each week until the average fasting blood sugar is 140-160. At that time stop increasing the dose & repeat the A1c and urine microalbumin.

## 2013-11-16 NOTE — Telephone Encounter (Signed)
Spoke with patient and taught her and her husband about administering Levemir insulin. Patient instructed to administer medication at the same time every day, rotate site and to monitor blood sugars. Patient also taught how to titrate levemir by increasing dose by 4 units each day each week until fasting blood sugars stay between 140-160. Once that goal is reached the patient is to stop increasing and recheck A1c and urine microalbumin. Patient and spouse were also advised to watch the amount of carbohydrates they consume and to increase exercise as much as possible. Patient;s spouse stated they have a treadmill in the home, I encouraged her to walk on that a minimum on 30 minutes a day at least 3 times a week. Patient is agreeable to that.

## 2013-11-17 ENCOUNTER — Telehealth: Payer: Self-pay | Admitting: *Deleted

## 2013-11-17 NOTE — Telephone Encounter (Signed)
Spoke with patient after she called the office with questions regarding the administration of this morning Levemir dose. Patient was concerned that because she received the initial dose of levemir in the office during our education, she was inquiring as to whether or not she should take the entire dose this morning. I again re-educated the patient that this medication is to be taken everyday around the same time each day if possible. Patient verbalized understanding and had no further questions.

## 2013-11-28 ENCOUNTER — Telehealth: Payer: Self-pay | Admitting: *Deleted

## 2013-11-28 NOTE — Telephone Encounter (Signed)
Note faxed to patient allowing her to carry insulin and pen needles with her on the aircraft.

## 2013-12-18 ENCOUNTER — Ambulatory Visit (INDEPENDENT_AMBULATORY_CARE_PROVIDER_SITE_OTHER): Payer: Managed Care, Other (non HMO) | Admitting: Internal Medicine

## 2013-12-18 ENCOUNTER — Encounter: Payer: Self-pay | Admitting: Internal Medicine

## 2013-12-18 VITALS — BP 112/72 | HR 108 | Temp 98.0°F | Resp 18 | Wt 179.0 lb

## 2013-12-18 DIAGNOSIS — E1165 Type 2 diabetes mellitus with hyperglycemia: Principal | ICD-10-CM

## 2013-12-18 DIAGNOSIS — IMO0001 Reserved for inherently not codable concepts without codable children: Secondary | ICD-10-CM

## 2013-12-18 DIAGNOSIS — E119 Type 2 diabetes mellitus without complications: Secondary | ICD-10-CM

## 2013-12-18 MED ORDER — INSULIN DETEMIR 100 UNIT/ML FLEXPEN: 48.0000 [IU] | PEN_INJECTOR | Freq: Every day | SUBCUTANEOUS | Status: DC

## 2013-12-18 NOTE — Patient Instructions (Addendum)
Check glucose once daily if possible Fasting or morning glucose recommended M, W, F, & Sun if possible. Goal= 100-150 Glucose 2 hours after breakfast Tues, after lunch Thurs & 2 hrs after eve meal Sat if possible. Goal = < 180;ideally < 160. Labs 02/15/14

## 2013-12-18 NOTE — Assessment & Plan Note (Signed)
Decrease long-acting insulin to 48 units each day. Monitor her 2 hour post meal glucoses to assess the impact of present nutritional regimen.  A1c and urine microalbumin in early March.  She was congratulated on the incredibly good job she has done

## 2013-12-18 NOTE — Progress Notes (Signed)
Subjective:    Patient ID: Christine [name], female    DOB: August 29, 1951, 63 y.o.   MRN: [id number]  HPI   Her Lantus was increased by 2 units every day if her fasting glucose was over 150. When she started early December her fasting blood sugars ranged 226 high to low 164. Average was 201. 6 .She has increased the dose up to 50 units as of 12/31. Her FBA averages have dropped as expected. After one week of 50 units her average sugar is 140.4.  She's had one episode where her glucose was 110 and she felt slightly shaky. This did not require treatment.    Review of Systems             No excess thirst ;  excess hunger ; or excess urination reported                              No lightheadedness with standing reported No chest pain ; palpitations ; claudication described .                                                                                                                             No non healing skin  ulcers or sores of extremities noted. No numbness or tingling or burning in feet described                                                                                                                                             No significant change in weight . No blurred,double, or loss of vision reported  .         Objective:   Physical Exam  Appears healthy and well-nourished & in no acute distress.Appears younger than stated age  No carotid bruits are present.No neck pain distention present at 10 - 15 degrees. Thyroid normal to palpation  Heart rhythm and rate are normal with insignificant  Grade 1/2- 1 systolic murmur.No gallop.  Chest is clear with no increased work of breathing  There is no evidence of aortic aneurysm or renal artery bruits  Abdomen soft with no organomegaly or masses. No HJR  No clubbing, cyanosis or edema present.  Pedal pulses are intact   No ischemic skin changes are present .  Alert and oriented. Strength, tone normal        Assessment & Plan:  See Current Assessment & Plan in Problem List under specific Diagnosis

## 2013-12-19 ENCOUNTER — Telehealth: Payer: Self-pay | Admitting: *Deleted

## 2013-12-19 NOTE — Telephone Encounter (Signed)
Left message on voice mail with detailed instructions for insulin administration.

## 2013-12-26 ENCOUNTER — Telehealth: Payer: Self-pay | Admitting: *Deleted

## 2013-12-26 NOTE — Telephone Encounter (Signed)
error 

## 2013-12-28 ENCOUNTER — Telehealth: Payer: Self-pay | Admitting: Internal Medicine

## 2013-12-28 ENCOUNTER — Other Ambulatory Visit: Payer: Self-pay | Admitting: *Deleted

## 2013-12-28 MED ORDER — GLUCOSE BLOOD VI STRP: ORAL_STRIP | Status: DC

## 2013-12-28 NOTE — Telephone Encounter (Signed)
Patient needs the directions on her glucose test strips updated. She states that she has ran out because the directions still say "Check blood sugar once daily as directed" and she is now on insulin and needs to check it more than what was previously instructed. Please advise.  Pt uses [facility] on [address].

## 2013-12-28 NOTE — Telephone Encounter (Signed)
Called patient and verified how may times a day she is checking her blood glucose levels. She states that she now checks twice daily since she is on insulin. New script e-scribed to pharmacy. JG//CMA

## 2014-04-19 ENCOUNTER — Ambulatory Visit (INDEPENDENT_AMBULATORY_CARE_PROVIDER_SITE_OTHER): Payer: Managed Care, Other (non HMO) | Admitting: Internal Medicine

## 2014-04-19 ENCOUNTER — Encounter: Payer: Self-pay | Admitting: Internal Medicine

## 2014-04-19 VITALS — BP 122/80 | HR 103 | Temp 96.8°F | Resp 14 | Wt 182.8 lb

## 2014-04-19 DIAGNOSIS — M25819 Other specified joint disorders, unspecified shoulder: Secondary | ICD-10-CM

## 2014-04-19 DIAGNOSIS — M7580 Other shoulder lesions, unspecified shoulder: Secondary | ICD-10-CM

## 2014-04-19 DIAGNOSIS — M7542 Impingement syndrome of left shoulder: Secondary | ICD-10-CM

## 2014-04-19 DIAGNOSIS — M65929 Unspecified synovitis and tenosynovitis, unspecified upper arm: Secondary | ICD-10-CM

## 2014-04-19 DIAGNOSIS — M659 Synovitis and tenosynovitis, unspecified: Secondary | ICD-10-CM

## 2014-04-19 DIAGNOSIS — M6580 Other synovitis and tenosynovitis, unspecified site: Secondary | ICD-10-CM

## 2014-04-19 NOTE — Patient Instructions (Signed)
Perform the exercises for elbow pain  twice a day as discussed. Use an anti-inflammatory cream such as Aspercreme or Zostrix cream twice a day to the affected area as needed. In lieu of this warm moist compresses or  hot water bottle can be used. Do not apply ice . Consider glucosamine sulfate 1500 mg daily for joint symptoms. Take this daily  for 3 months and then leave it off for 2 months. This will rehydrate the cartilages.

## 2014-04-19 NOTE — Progress Notes (Signed)
Pre visit review using our clinic review tool, if applicable. No additional management support is needed unless otherwise documented below in the visit note. 

## 2014-04-19 NOTE — Progress Notes (Signed)
   Subjective:    Patient ID: Christine [name], female    DOB: 04-30-1951, 63 y.o.   MRN: [id number]  HPI   She's had pain in left shoulder and elbow for over 6 weeks. There was no definite injury or trigger for this except for reaching behind to pick up an item while seated  in her automobile.  It is described as dull as sharp up to level V and almost constant.  She has difficulty holding anything of significant weight because of pain at the elbow.   With LUE activity through the day the pain at the elbow and shoulder increase in intensity  She feels that there is some associated weakness in the hand. She denies enlarged lymph nodes or abnormal bruising.    Review of Systems  There's been no redness or change in the color or temperature of the skin  She has no fever, chills, sweats, or weight loss.  She's had no associated neurologic symptoms       Objective:   Physical Exam   She appears healthy and well-nourished in no distress.  She has no lymphadenopathy about the neck or axilla.  There is slight asymmetry of nasolabial folds.  Crepitus is present the left shoulder with discomfort. She can elevate both arms above her head without pain  She has classic tenosynovitis of the left elbow with testing.  Deep tendon reflexes, strength, and tone are normal  Skin reveals no suspicious lesions        Assessment & Plan:  #1 tenosynovitis left elbow  #2 left shoulder impingement syndrome  See recommendations. Sports medicine referral if symptoms fail to respond to these interventions.

## 2014-05-10 ENCOUNTER — Other Ambulatory Visit: Payer: Self-pay | Admitting: *Deleted

## 2014-05-10 DIAGNOSIS — M7542 Impingement syndrome of left shoulder: Secondary | ICD-10-CM

## 2014-05-11 ENCOUNTER — Other Ambulatory Visit (INDEPENDENT_AMBULATORY_CARE_PROVIDER_SITE_OTHER): Payer: Managed Care, Other (non HMO)

## 2014-05-11 ENCOUNTER — Ambulatory Visit (INDEPENDENT_AMBULATORY_CARE_PROVIDER_SITE_OTHER): Payer: Managed Care, Other (non HMO) | Admitting: Family Medicine

## 2014-05-11 ENCOUNTER — Encounter: Payer: Self-pay | Admitting: Family Medicine

## 2014-05-11 VITALS — BP 144/86 | HR 115 | Ht 66.0 in | Wt 186.0 lb

## 2014-05-11 DIAGNOSIS — M7542 Impingement syndrome of left shoulder: Secondary | ICD-10-CM

## 2014-05-11 DIAGNOSIS — M25519 Pain in unspecified shoulder: Secondary | ICD-10-CM

## 2014-05-11 DIAGNOSIS — M25512 Pain in left shoulder: Secondary | ICD-10-CM

## 2014-05-11 DIAGNOSIS — M719 Bursopathy, unspecified: Secondary | ICD-10-CM

## 2014-05-11 DIAGNOSIS — M67919 Unspecified disorder of synovium and tendon, unspecified shoulder: Secondary | ICD-10-CM

## 2014-05-11 HISTORY — DX: Impingement syndrome of left shoulder: M75.42

## 2014-05-11 NOTE — Patient Instructions (Signed)
You had a bone spur Check blood sugar at least 2 times daily and recommend 3 times daily for next 4 days.  May need to adjust Levimir by 2-5 units.  Exercises 3 times a week Continue the Vitamin D  Turmeric 500mg  twice daily.  Come back in 3-4 weeks to check in

## 2014-05-11 NOTE — Progress Notes (Signed)
[name] [address] [place], [place] [address] Phone: [phone] Subjective:    I'm seeing this patient by the request  of:  Unice Cobble, MD   CC: Left shoulder pain  [id number] Pansey AJEE HEASLEY is a 63 y.o. female coming in with complaint of left shoulder pain. Patient states she's had shoulder pain since December. Patient states last 6 months it seems to be increasing. Patient states it hurts more when she reaches behind her back or across his body. Denies any radiation down the arm or any numbness. Patient states that she can sleep comfortably. Patient has tried Advil which is somewhat helpful. Has not tried ice order much he. Patient denies any neck pain is associated with the pain. Rates the severity of 7/10     Past medical history, social, surgical and family history all reviewed in electronic medical record.   Review of Systems: No headache, visual changes, nausea, vomiting, diarrhea, constipation, dizziness, abdominal pain, skin rash, fevers, chills, night sweats, weight loss, swollen lymph nodes, body aches, joint swelling, muscle aches, chest pain, shortness of breath, mood changes.   Objective Blood pressure 144/86, pulse 115, height 5\' 6"  (1.676 m), weight 186 lb (84.369 kg), SpO2 96.00%.  General: No apparent distress alert and oriented x3 mood and affect normal, dressed appropriately.  HEENT: Pupils equal, extraocular movements intact  Respiratory: Patient's speak in full sentences and does not appear short of breath  Cardiovascular: No lower extremity edema, non tender, no erythema  Skin: Warm dry intact with no signs of infection or rash on extremities or on axial skeleton.  Abdomen: Soft nontender  Neuro: Cranial nerves II through XII are intact, neurovascularly intact in all extremities with 2+ DTRs and 2+ pulses.  Lymph: No lymphadenopathy of posterior or anterior cervical chain or axillae bilaterally.  Gait normal with  good balance and coordination.  MSK:  Non tender with full range of motion and good stability and symmetric strength and tone of  elbows, wrist, hip, knee and ankles bilaterally.  Shoulder: left Inspection reveals no abnormalities, atrophy or asymmetry. Palpation is normal with no tenderness over AC joint or bicipital groove. ROM is full in all planes passively. Rotator cuff strength normal throughout. signs of impingement with positive Neer and Hawkin's tests, but negative empty can sign. Speeds and Yergason's tests normal. No labral pathology noted with negative Obrien's, negative clunk and good stability. Normal scapular function observed. No painful arc and no drop arm sign. No apprehension sign  MSK US performed of: left This study was ordered, performed, and interpreted by Charlann Boxer D.O.  Shoulder:   Supraspinatus:  Appears normal on long and transverse views, Bursal bulge seen with shoulder abduction on impingement view. Infraspinatus:  Appears normal on long and transverse views. Significant increase in Doppler flow Subscapularis:  Appears normal on long and transverse views. Positive bursa patient also has a very large bone spur that is what giving patient the pain on dynamic testing. Teres Minor:  Appears normal on long and transverse views. AC joint:  Capsule undistended, no geyser sign. Glenohumeral Joint:  Appears normal without effusion. Glenoid Labrum:  Intact without visualized tears. Biceps Tendon:  Appears normal on long and transverse views, no fraying of tendon, tendon located in intertubercular groove, no subluxation with shoulder internal or external rotation.  Impression: Subacromial bursitis with spur and subacromial space  Procedure: Real-time Ultrasound Guided Injection of left glenohumeral joint Device: GE Logiq E  Ultrasound guided injection is  preferred based studies that show increased duration, increased effect, greater accuracy, decreased procedural  pain, increased response rate with ultrasound guided versus blind injection.  Verbal informed consent obtained.  Time-out conducted.  Noted no overlying erythema, induration, or other signs of local infection.  Skin prepped in a sterile fashion.  Local anesthesia: Topical Ethyl chloride.  With sterile technique and under real time ultrasound guidance:  Joint visualized.  23g 1  inch needle inserted anterior approach. Pictures taken for needle placement. This was right at the bone spur that we saw at the insertion of the subscapularis tendon Patient did have injection of 2 cc of 1% lidocaine, 2 cc of 0.5% Marcaine, and 1.0 cc of Kenalog 40 mg/dL. Completed without difficulty  Pain immediately resolved suggesting accurate placement of the medication.  Advised to call if fevers/chills, erythema, induration, drainage, or persistent bleeding.  Images permanently stored and available for review in the ultrasound unit.  Impression: Technically successful ultrasound guided injection.    Impression and Recommendations:     This case required medical decision making of moderate complexity.

## 2014-05-11 NOTE — Assessment & Plan Note (Addendum)
Patient did have a rotator cuff impingement syndrome of his left shoulder secondary to bone spur. Patient did have an injection today and tolerated the procedure very well. We did in for the anterior aspect of this bone spur i and did do a small lavage taking out some of the calcium. Patient tolerated the procedure well and did have increasing range of motion almost immediately. Post aspiration injections taken. Patient given home exercise program as well as an icing protocol. We discussed over-the-counter medications that could be beneficial. Patient will try these interventions and come back again in 3 weeks for further evaluation and treatment. Also discussed with patient to monitor blood sugars.

## 2014-06-01 ENCOUNTER — Ambulatory Visit (INDEPENDENT_AMBULATORY_CARE_PROVIDER_SITE_OTHER): Payer: Managed Care, Other (non HMO) | Admitting: Family Medicine

## 2014-06-01 ENCOUNTER — Ambulatory Visit (INDEPENDENT_AMBULATORY_CARE_PROVIDER_SITE_OTHER)
Admission: RE | Admit: 2014-06-01 | Discharge: 2014-06-01 | Disposition: A | Discharge status: Home / Self Care | Payer: Managed Care, Other (non HMO) | Source: Ambulatory Visit | Attending: Family Medicine | Admitting: Family Medicine

## 2014-06-01 ENCOUNTER — Encounter: Payer: Self-pay | Admitting: Family Medicine

## 2014-06-01 VITALS — BP 132/80 | HR 101 | Ht 66.0 in | Wt 186.0 lb

## 2014-06-01 DIAGNOSIS — M67919 Unspecified disorder of synovium and tendon, unspecified shoulder: Secondary | ICD-10-CM

## 2014-06-01 DIAGNOSIS — M7542 Impingement syndrome of left shoulder: Secondary | ICD-10-CM

## 2014-06-01 DIAGNOSIS — M719 Bursopathy, unspecified: Secondary | ICD-10-CM

## 2014-06-01 DIAGNOSIS — M542 Cervicalgia: Secondary | ICD-10-CM

## 2014-06-01 MED ORDER — DICLOFENAC SODIUM 2 % TD SOLN: 2.0000 "application " | Freq: Two times a day (BID) | TRANSDERMAL | Status: DC

## 2014-06-01 NOTE — Assessment & Plan Note (Signed)
To do believe that patient's shoulder pain is improving. Patient though may have reaccumulation of the calcium deposit over the course of time. This has occurred we would need to do another injection under ultrasound guidance. We can also do a lavage if necessary. Patient is going to start formal physical therapy continued home exercise program as well as the over-the-counter medications we discussed previously. Patient try these interventions and come back again in 4 weeks for further evaluation the  Spent greater than 25 minutes with patient face-to-face and had greater than 50% of counseling including as described above in assessment and plan.

## 2014-06-01 NOTE — Patient Instructions (Signed)
Good to see you Xray downstairs today Continue the shoulder exercises and the neck exercises 3 times a week Physical therapy will be calling you [facility] twice daily. They will send it to you Come back in 4 weeks.

## 2014-06-01 NOTE — Progress Notes (Signed)
  [name] [address] [place], [place] [address] Phone: [phone] Subjective:    CC: Left shoulder pain follow up  [id number] Christine [name] is a 63 y.o. female coming in with complaint of left shoulder pain.  Patient was seen previously she was diagnosed with a calcific spur in the rotator cuff. Patient did have an injection and states that she is approximately 50% better. Patient is having some mild discomfort toe on the left neck as well. Patient states that she is able to do light duty today living but with certain movements she has pain I can take her to the ground. Patient denies any numbness or tingling. Denies any nighttime awakening which is better. Overall she does think she's improving but of course is scared to the possibility of the recurrent pain.     Past medical history, social, surgical and family history all reviewed in electronic medical record.   Review of Systems: No headache, visual changes, nausea, vomiting, diarrhea, constipation, dizziness, abdominal pain, skin rash, fevers, chills, night sweats, weight loss, swollen lymph nodes, body aches, joint swelling, muscle aches, chest pain, shortness of breath, mood changes.   Objective Blood pressure 132/80, pulse 101, height 5\' 6"  (1.676 m), weight 186 lb (84.369 kg), SpO2 95.00%.  General: No apparent distress alert and oriented x3 mood and affect normal, dressed appropriately.  HEENT: Pupils equal, extraocular movements intact  Respiratory: Patient's speak in full sentences and does not appear short of breath  Cardiovascular: No lower extremity edema, non tender, no erythema  Skin: Warm dry intact with no signs of infection or rash on extremities or on axial skeleton.  Abdomen: Soft nontender  Neuro: Cranial nerves II through XII are intact, neurovascularly intact in all extremities with 2+ DTRs and 2+ pulses.  Lymph: No lymphadenopathy of posterior or anterior cervical chain or  axillae bilaterally.  Gait normal with good balance and coordination.  MSK:  Non tender with full range of motion and good stability and symmetric strength and tone of  elbows, wrist, hip, knee and ankles bilaterally.  Shoulder: left Inspection reveals no abnormalities, atrophy or asymmetry. Palpation is normal with no tenderness over AC joint or bicipital groove. ROM is full in all planes passively. Rotator cuff strength normal throughout. signs of impingement with positive Neer and Hawkin's tests, but negative empty can sign. Mild improvement from previous exam Speeds and Yergason's tests normal. No labral pathology noted with negative Obrien's, negative clunk and good stability. Normal scapular function observed. No painful arc and no drop arm sign. No apprehension sign Neck: Inspection unremarkable. No palpable stepoffs. Negative Spurling's maneuver but does have significant pain with compression. Full neck range of motion Grip strength and sensation normal in bilateral hands Strength good C4 to T1 distribution No sensory change to C4 to T1 Negative Hoffman sign bilaterally Reflexes normal    Impression and Recommendations:     This case required medical decision making of moderate complexity.

## 2014-06-01 NOTE — Assessment & Plan Note (Signed)
Patient's neck pain is likely multifactorial secondary to poor posture as well as fibromyalgia. I would like to get x-rays to rule out any severe degenerative changes of the neck. Patient given home exercise program and will also be sent to formal physical therapy for this problem. Patient follow up again in 4 weeks.

## 2014-06-18 ENCOUNTER — Other Ambulatory Visit: Payer: Self-pay

## 2014-06-18 MED ORDER — ATORVASTATIN CALCIUM 20 MG PO TABS: 20.0000 mg | ORAL_TABLET | Freq: Every day | ORAL | Status: DC

## 2014-06-20 ENCOUNTER — Telehealth: Payer: Self-pay | Admitting: *Deleted

## 2014-06-20 DIAGNOSIS — E1165 Type 2 diabetes mellitus with hyperglycemia: Principal | ICD-10-CM

## 2014-06-20 DIAGNOSIS — IMO0001 Reserved for inherently not codable concepts without codable children: Secondary | ICD-10-CM

## 2014-06-20 NOTE — Telephone Encounter (Signed)
Left message on machine for patient to call office and schedule an appointment for diabetes. A1c, bmet ordered Diabetic bundle

## 2014-06-29 ENCOUNTER — Ambulatory Visit (INDEPENDENT_AMBULATORY_CARE_PROVIDER_SITE_OTHER): Payer: Managed Care, Other (non HMO) | Admitting: Family Medicine

## 2014-06-29 ENCOUNTER — Encounter: Payer: Self-pay | Admitting: Family Medicine

## 2014-06-29 VITALS — BP 130/88 | HR 100 | Temp 98.2°F | Wt 186.0 lb

## 2014-06-29 DIAGNOSIS — M25512 Pain in left shoulder: Secondary | ICD-10-CM

## 2014-06-29 DIAGNOSIS — M25519 Pain in unspecified shoulder: Secondary | ICD-10-CM

## 2014-06-29 DIAGNOSIS — M67919 Unspecified disorder of synovium and tendon, unspecified shoulder: Secondary | ICD-10-CM

## 2014-06-29 DIAGNOSIS — M719 Bursopathy, unspecified: Secondary | ICD-10-CM

## 2014-06-29 DIAGNOSIS — M7542 Impingement syndrome of left shoulder: Secondary | ICD-10-CM

## 2014-06-29 MED ORDER — TRAMADOL HCL 50 MG PO TABS: 50.0000 mg | ORAL_TABLET | Freq: Two times a day (BID) | ORAL | Status: DC | PRN

## 2014-06-29 NOTE — Progress Notes (Signed)
Pre visit review using our clinic review tool, if applicable. No additional management support is needed unless otherwise documented below in the visit note. 

## 2014-06-29 NOTE — Assessment & Plan Note (Addendum)
Patient shoulder pain continues to give her trouble. Patient is not making any significant improvements and is failing all conservative therapy at this time. Patient has tried anti-inflammatories, topical anti-inflammatories, intra-articular injections, as well as physical therapy and home exercises. I do believe that further imaging is necessary. We will get an MRI to evaluate for this bone spurring and the help of the rotator cuff and possible labrum. Patient is going to have this done and then come back and see me again 1-2 days after it to further discuss findings. In the meantime we will decrease patient's exercises to 50% what she was doing to continue the icing. Patient was given a prescription for tramadol. We discussed potential side effects.

## 2014-06-29 NOTE — Patient Instructions (Signed)
Good to see you.  We will get MRi of your left shoulder.  Come back 1-2 days after the [facility] and we will discuss.  Ice is still your friend.  Hold on exercises.  Tramadol as needed Try the pennsaid twice daily.  See you soon.

## 2014-06-29 NOTE — Progress Notes (Signed)
  [name] [address] [place], [place] [address] Phone: [phone] Subjective:    CC: Left shoulder pain follow up  [id number] Christine [name] is a 63 y.o. female coming in with complaint of left shoulder pain.  Patient was seen previously she was diagnosed with a calcific spur in the rotator cuff. Patient did have an injection and states that she is approximately 50% better. In addition this patient did go to formal physical therapy and has noticed minimal improvement. Patient actually states that the pain is starting to increase slowly in the left shoulder again. Patient states that it makes it difficult to do any overhead activity or reaching behind her back. Patient has a severe pain with certain movements. Patient describes it as a sharp pain. Denies any significant weakness but is not using his arm as much as she regularly does. Patient is also noticed that the pain seems to radiate up her neck from time to time but denies that the neck pain is associated all the time. Patient states that the ibuprofen and Tylenol is not helping.     Past medical history, social, surgical and family history all reviewed in electronic medical record.   Review of Systems: No headache, visual changes, nausea, vomiting, diarrhea, constipation, dizziness, abdominal pain, skin rash, fevers, chills, night sweats, weight loss, swollen lymph nodes, body aches, joint swelling, muscle aches, chest pain, shortness of breath, mood changes.   Objective Blood pressure 130/88, pulse 100, temperature 98.2 F (36.8 C), temperature source Oral, weight 186 lb (84.369 kg), SpO2 94.00%.  General: No apparent distress alert and oriented x3 mood and affect normal, dressed appropriately.  HEENT: Pupils equal, extraocular movements intact  Respiratory: Patient's speak in full sentences and does not appear short of breath  Cardiovascular: No lower extremity edema, non tender, no erythema    Skin: Warm dry intact with no signs of infection or rash on extremities or on axial skeleton.  Abdomen: Soft nontender  Neuro: Cranial nerves II through XII are intact, neurovascularly intact in all extremities with 2+ DTRs and 2+ pulses.  Lymph: No lymphadenopathy of posterior or anterior cervical chain or axillae bilaterally.  Gait normal with good balance and coordination.  MSK:  Non tender with full range of motion and good stability and symmetric strength and tone of  elbows, wrist, hip, knee and ankles bilaterally.  Shoulder: left Inspection reveals no abnormalities, atrophy or asymmetry. Palpation is normal with no tenderness over AC joint or bicipital groove. ROM is full in all planes passively but pain with external rotation and internal rotation. Rotator cuff strength normal throughout. signs of impingement with positive Neer and Hawkin's tests, but negative empty can sign.  Speeds and Yergason's tests normal. No labral pathology noted with negative Obrien's, negative clunk and good stability. Normal scapular function observed. No painful arc and no drop arm sign. No apprehension sign Neck: Inspection unremarkable. No palpable stepoffs. Negative Spurling's maneuver Full neck range of motion Grip strength and sensation normal in bilateral hands Strength good C4 to T1 distribution No sensory change to C4 to T1 Negative Hoffman sign bilaterally Reflexes normal    Impression and Recommendations:     This case required medical decision making of moderate complexity.

## 2014-07-06 ENCOUNTER — Other Ambulatory Visit: Payer: Self-pay | Admitting: *Deleted

## 2014-07-06 DIAGNOSIS — M7542 Impingement syndrome of left shoulder: Secondary | ICD-10-CM

## 2014-07-09 ENCOUNTER — Ambulatory Visit (INDEPENDENT_AMBULATORY_CARE_PROVIDER_SITE_OTHER)
Admission: RE | Admit: 2014-07-09 | Discharge: 2014-07-09 | Disposition: A | Discharge status: Home / Self Care | Payer: Managed Care, Other (non HMO) | Source: Ambulatory Visit | Attending: Family Medicine | Admitting: Family Medicine

## 2014-07-09 DIAGNOSIS — M7542 Impingement syndrome of left shoulder: Secondary | ICD-10-CM

## 2014-07-09 DIAGNOSIS — M719 Bursopathy, unspecified: Secondary | ICD-10-CM

## 2014-07-09 DIAGNOSIS — M67919 Unspecified disorder of synovium and tendon, unspecified shoulder: Secondary | ICD-10-CM

## 2014-07-10 ENCOUNTER — Encounter: Payer: Self-pay | Admitting: Family Medicine

## 2014-07-16 ENCOUNTER — Other Ambulatory Visit: Payer: Self-pay

## 2014-07-16 ENCOUNTER — Ambulatory Visit
Admission: RE | Admit: 2014-07-16 | Discharge: 2014-07-16 | Disposition: A | Discharge status: Home / Self Care | Payer: Managed Care, Other (non HMO) | Source: Ambulatory Visit | Attending: Family Medicine | Admitting: Family Medicine

## 2014-07-16 DIAGNOSIS — M25512 Pain in left shoulder: Secondary | ICD-10-CM

## 2014-07-16 MED ORDER — OMEPRAZOLE 20 MG PO CPDR: 20.0000 mg | DELAYED_RELEASE_CAPSULE | Freq: Every day | ORAL | Status: DC

## 2014-07-19 ENCOUNTER — Encounter: Payer: Self-pay | Admitting: Family Medicine

## 2014-07-19 ENCOUNTER — Ambulatory Visit (INDEPENDENT_AMBULATORY_CARE_PROVIDER_SITE_OTHER): Payer: Managed Care, Other (non HMO) | Admitting: Family Medicine

## 2014-07-19 VITALS — BP 138/72 | HR 99 | Ht 66.0 in | Wt 183.0 lb

## 2014-07-19 DIAGNOSIS — M999 Biomechanical lesion, unspecified: Secondary | ICD-10-CM

## 2014-07-19 DIAGNOSIS — M7542 Impingement syndrome of left shoulder: Secondary | ICD-10-CM

## 2014-07-19 DIAGNOSIS — M9981 Other biomechanical lesions of cervical region: Secondary | ICD-10-CM

## 2014-07-19 DIAGNOSIS — M67919 Unspecified disorder of synovium and tendon, unspecified shoulder: Secondary | ICD-10-CM

## 2014-07-19 DIAGNOSIS — M719 Bursopathy, unspecified: Secondary | ICD-10-CM

## 2014-07-19 DIAGNOSIS — M542 Cervicalgia: Secondary | ICD-10-CM

## 2014-07-19 HISTORY — DX: Biomechanical lesion, unspecified: M99.9

## 2014-07-19 NOTE — Patient Instructions (Signed)
It is good to see you You have more of muscle imbalances causing elevated first rib, which then causes spasm to your shoulder Your shoulder actually is really good with minimal tendonopathy.  Continue the ice.  Continue the over the counter medicine Work on sleeping position and do not be right under AC vent. Keep arm down at nigh if possible.  On wall heels, butt, shoulder and head touch for goal of 5 minutes daily.  Come back in 2-3 weeks.

## 2014-07-19 NOTE — Assessment & Plan Note (Signed)
Decision today to treat with OMT was based on Physical Exam  After verbal consent patient was treated with HVLA, ME, FPR techniques in cervical, thoracic, lumbar and sacral as well as rib areas  Patient tolerated the procedure well with improvement in symptoms  Patient given exercises, stretches and lifestyle modifications  See medications in patient instructions if given  Patient will follow up in 2-3 weeks

## 2014-07-19 NOTE — Assessment & Plan Note (Signed)
Patient does have some rotator cuff impingement and still has some mild calcific changes within the shoulder itself but overall I think she is going to do relatively well. I think patient's pain is more referred pain from musculoskeletal complaints. Patient did respond fairly well to osteopathic manipulation and can to continue to hope that this will be beneficial. Patient does have fibromyalgia that could also be contributing to we may need to discuss further different treatment options. Patient will come back in 2-3 weeks.  Spent greater than 25 minutes with patient face-to-face and had greater than 50% of counseling including as described above in assessment and plan.

## 2014-07-19 NOTE — Progress Notes (Signed)
[name] [address], [place] [address] Phone: [phone] Subjective:    CC: Left shoulder pain follow up  [id number] Christine [name] is a 63 y.o. female coming in with complaint of left shoulder pain.  Patient was seen previously she was diagnosed with a calcific spur in the rotator cuff. Patient did have an injection and states that she is approximately 50% better. Patient even after physical therapy was having recurrent pain. Patient was sent for an MRI is in here for further evaluation and to go over the MRI. Patient's MRI did not show anything significant with very mild rotator cuff tendinopathy but no true tear, no labral tear, and overall relatively normal MRI. 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 Patient states that it continues to give her trouble when she abduction arm. Denies any radiation down her arm or any numbness. Patient states that it is controllable but she has had to take tramadol on the as-needed basis about 3-4 times a week. States that it can be difficult to fall asleep at night secondary to this pain.     Past medical history, social, surgical and family history all reviewed in electronic medical record.   Review of Systems: No headache, visual changes, nausea, vomiting, diarrhea, constipation, dizziness, abdominal pain, skin rash, fevers, chills, night sweats, weight loss, swollen lymph nodes, body aches, joint swelling, muscle aches, chest pain, shortness of breath, mood changes.   Objective Blood pressure 138/72, pulse 99, height 5\' 6"  (1.676 m), weight 183 lb (83.008 kg), SpO2 96.00%.  General: No apparent distress alert and oriented x3 mood and affect normal, dressed appropriately.  HEENT: Pupils equal, extraocular movements intact  Respiratory: Patient's speak in full sentences and does not appear short of breath    Cardiovascular: No lower extremity edema, non tender, no erythema  Skin: Warm dry intact with no signs of infection or rash on extremities or on axial skeleton.  Abdomen: Soft nontender  Neuro: Cranial nerves II through XII are intact, neurovascularly intact in all extremities with 2+ DTRs and 2+ pulses.  Lymph: No lymphadenopathy of posterior or anterior cervical chain or axillae bilaterally.  Gait normal with good balance and coordination.  MSK:  Non tender with full range of motion and good stability and symmetric strength and tone of  elbows, wrist, hip, knee and ankles bilaterally.  Shoulder: left Inspection reveals no abnormalities, atrophy or asymmetry. Palpation is normal with no tenderness over AC joint or bicipital groove. ROM is full in all planes passively but pain with external rotation and internal rotation. Rotator cuff strength normal throughout. signs of impingement with positive Neer and Hawkin's tests, but negative empty can sign.  Speeds and Yergason's tests normal. No labral pathology noted with negative Obrien's, negative clunk and good stability. Normal scapular function observed. No painful arc and no drop arm sign. No apprehension sign Neck: Inspection unremarkable. No palpable stepoffs. Negative Spurling's maneuver Full neck range of motion Grip strength and sensation normal in bilateral hands Strength good C4 to T1 distribution No sensory change to C4 to T1 Negative Hoffman sign bilaterally Reflexes normal  OMT Physical Exam   Standing flexion  Right   Seated Flexion  Right  Cervical  C2 flexed rotated and side bent right C7 flexed rotated and side bent left  Thoracic T1 extended rotated and side bent left with elevated first rib T5 extended rotated and side bent  right  Lumbar L2 flexed rotated inside that right  Sacrum Right on right         Impression and Recommendations:     This case required medical decision making of  moderate complexity.

## 2014-07-19 NOTE — Assessment & Plan Note (Signed)
Mild osteoarthritic changes. Patient did respond very well to manipulation due to. Patient will continue with home exercises and was given postural exercises I think will be beneficial. Patient will try these things and come back and see me again in 3-4 weeks for further evaluation and treatment.  Spent greater than 25 minutes with patient face-to-face and had greater than 50% of counseling including as described above in assessment and plan.

## 2014-08-01 ENCOUNTER — Ambulatory Visit (INDEPENDENT_AMBULATORY_CARE_PROVIDER_SITE_OTHER): Payer: Managed Care, Other (non HMO) | Admitting: Family Medicine

## 2014-08-01 ENCOUNTER — Encounter: Payer: Self-pay | Admitting: Family Medicine

## 2014-08-01 VITALS — BP 144/70 | HR 114 | Ht 66.0 in | Wt 184.0 lb

## 2014-08-01 DIAGNOSIS — M9981 Other biomechanical lesions of cervical region: Secondary | ICD-10-CM

## 2014-08-01 DIAGNOSIS — M999 Biomechanical lesion, unspecified: Secondary | ICD-10-CM

## 2014-08-01 DIAGNOSIS — M542 Cervicalgia: Secondary | ICD-10-CM

## 2014-08-01 NOTE — Assessment & Plan Note (Signed)
After patient's complete workup patient's pain seems to be more specific for musculoskeletal complaints and poor posture than it is for any osteoarthritic findings. Patient does have a history of fibromyalgia that also is contributing to the pain. Patient was responding well and we will decrease some of the exercises she was doing that was causing some discomfort. Patient was given do exercises better not as intense and we will slowly progress. Discuss once again the importance of the posture as well as lifting mechanics. Patient and will come back again in 3 weeks for further evaluation and treatment.

## 2014-08-01 NOTE — Progress Notes (Signed)
  [name] [address] [place], [place] [address] Phone: [phone] Subjective:    CC: Left shoulder pain follow up  [id number] Ale[place] CHANIAH CISSE is a 63 y.o. female coming in with complaint of left shoulder pain.  Patient was seen previously she was diagnosed with a calcific spur in the rotator cuff. Patient did have an injection and states that she is approximately 50% better. Patient even after physical therapy was having recurrent pain. Patient was sent for an MRI is in here for further evaluation and to go over the MRI. Patient's MRI did not show anything significant with very mild rotator cuff tendinopathy but no true tear, no labral tear, and overall relatively normal MRI. 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  Patient at last visit and had osteopathic manipulation. Patient was given home exercises as well as postural exercises that were more to work on muscle imbalances. Patient states she was doing actually remarkably better until she traveled from [place]. Patient was sleeping on another bed and started having some discomfort again. Patient was playing with her granddaughter a significant amount of time. Overall still better than what she was but started to have the pain come back. No new symptoms.     Past medical history, social, surgical and family history all reviewed in electronic medical record.   Review of Systems: No headache, visual changes, nausea, vomiting, diarrhea, constipation, dizziness, abdominal pain, skin rash, fevers, chills, night sweats, weight loss, swollen lymph nodes, body aches, joint swelling, muscle aches, chest pain, shortness of breath, mood changes.   Objective Blood pressure 144/70, pulse 114, height 5\' 6"  (1.676 m), weight 184 lb (83.462 kg), SpO2 95.00%.  General: No apparent distress alert and oriented x3 mood and affect normal,  dressed appropriately.  HEENT: Pupils equal, extraocular movements intact  Respiratory: Patient's speak in full sentences and does not appear short of breath  Cardiovascular: No lower extremity edema, non tender, no erythema  Skin: Warm dry intact with no signs of infection or rash on extremities or on axial skeleton.  Abdomen: Soft nontender  Neuro: Cranial nerves II through XII are intact, neurovascularly intact in all extremities with 2+ DTRs and 2+ pulses.  Lymph: No lymphadenopathy of posterior or anterior cervical chain or axillae bilaterally.  Gait normal with good balance and coordination.  MSK:  Non tender with full range of motion and good stability and symmetric strength and tone of  elbows, wrist, hip, knee and ankles bilaterally.  Shoulder still has impingement signs. Neck: Inspection unremarkable. No palpable stepoffs. Negative Spurling's maneuver Full neck range of motion Grip strength and sensation normal in bilateral hands Strength good C4 to T1 distribution No sensory change to C4 to T1 Negative Hoffman sign bilaterally Reflexes normal  OMT Physical Exam   Cervical  C2 flexed rotated and side bent right C7 flexed rotated and side bent left  Thoracic T1 extended rotated and side bent left with elevated first rib T7 extended rotated and side bent right  Lumbar L2 flexed rotated inside that right  Sacrum Right on right    Impression and Recommendations:     This case required medical decision making of moderate complexity.

## 2014-08-01 NOTE — Assessment & Plan Note (Signed)
Decision today to treat with OMT was based on Physical Exam  After verbal consent patient was treated with HVLA, ME, FPR techniques in cervical, thoracic, lumbar and sacral as well as rib areas  Patient tolerated the procedure well with improvement in symptoms  Patient given exercises, stretches and lifestyle modifications  See medications in patient instructions if given  Patient will follow up in 3-4 weeks

## 2014-08-01 NOTE — Patient Instructions (Signed)
It is good to see you Continue the exercises I think vent guard would be great Ice when you need it.  Tennis ball duct tape together and put them on floor put them where head meets neck and lay on them. Usually 5-15 minutes.  Keep working on wall.  Start at 1 minutes and increase 10 seconds weekly.  Come back again in 3 weeks.

## 2014-08-08 ENCOUNTER — Ambulatory Visit (INDEPENDENT_AMBULATORY_CARE_PROVIDER_SITE_OTHER): Payer: Managed Care, Other (non HMO) | Admitting: Internal Medicine

## 2014-08-08 ENCOUNTER — Other Ambulatory Visit (INDEPENDENT_AMBULATORY_CARE_PROVIDER_SITE_OTHER): Payer: Managed Care, Other (non HMO)

## 2014-08-08 ENCOUNTER — Encounter: Payer: Self-pay | Admitting: Internal Medicine

## 2014-08-08 VITALS — BP 136/80 | HR 103 | Temp 98.1°F | Wt 182.8 lb

## 2014-08-08 DIAGNOSIS — IMO0001 Reserved for inherently not codable concepts without codable children: Secondary | ICD-10-CM

## 2014-08-08 DIAGNOSIS — I10 Essential (primary) hypertension: Secondary | ICD-10-CM

## 2014-08-08 DIAGNOSIS — E1165 Type 2 diabetes mellitus with hyperglycemia: Principal | ICD-10-CM

## 2014-08-08 DIAGNOSIS — R000 Tachycardia, unspecified: Secondary | ICD-10-CM

## 2014-08-08 LAB — MICROALBUMIN / CREATININE URINE RATIO
Creatinine,U: 22.7 mg/dL
MICROALB UR: 0.6 mg/dL (ref 0.0–1.9)
Microalb Creat Ratio: 2.6 mg/g (ref 0.0–30.0)

## 2014-08-08 LAB — BASIC METABOLIC PANEL
BUN: 13 mg/dL (ref 6–23)
CO2: 26 mEq/L (ref 19–32)
Calcium: 9.6 mg/dL (ref 8.4–10.5)
Chloride: 103 mEq/L (ref 96–112)
Creatinine, Ser: 0.6 mg/dL (ref 0.4–1.2)
GFR: 117.83 mL/min (ref >60.00)
GLUCOSE: 203 mg/dL — AB (ref 70–99)
POTASSIUM: 3.9 meq/L (ref 3.5–5.1)
SODIUM: 137 meq/L (ref 135–145)

## 2014-08-08 LAB — TSH: TSH: 2.07 u[IU]/mL (ref 0.35–4.50)

## 2014-08-08 LAB — HEMOGLOBIN A1C: Hgb A1c MFr Bld: 8 % — ABNORMAL HIGH (ref 4.6–6.5)

## 2014-08-08 NOTE — Progress Notes (Signed)
Pre visit review using our clinic review tool, if applicable. No additional management support is needed unless otherwise documented below in the visit note. 

## 2014-08-08 NOTE — Assessment & Plan Note (Signed)
A1c , urine microalbumin, BMET 

## 2014-08-08 NOTE — Patient Instructions (Signed)
Your next office appointment will be determined based upon review of your pending labs. Those instructions will be transmitted to you through My Chart . 

## 2014-08-08 NOTE — Progress Notes (Signed)
   Subjective:    Patient ID: Christine [name], female    DOB: 1951/03/07, 63 y.o.   MRN: [id number]  HPI  The average FBS has ranged from 114.39-159 over past 6 mos. She has had glucose 2 hours after a meal as high as 316, which prompted this visit.  She has been compliant with her Janumet and Levemir. She's presently on 48 units  She is counting carbs; she has seen a nutritionist  She has some intermittent exercise program consisting of walking  Her eye exam was normal in November 2014. She is not seeing a podiatrist.  She is concerned that glucose rises were related to "a bad batch of insulin".  She has no other diabetic-related symptoms.   Review of Systems   Polyuria, polyphagia, polydipsia absent. There is no blurred vision, double vision, or loss of vision.  Also denied are numbness, tingling, or burning of the extremities. No nonhealing skin lesions present. Weight is stable.      Objective:   Physical Exam Appears healthy and well-nourished & in no acute distress  No carotid bruits are present.No neck pain distention present at 10 - 15 degrees. Thyroid normal to palpation  Heart rhythm  normal with no gallop or murmur. Pulse recheck 95; asymptomatic  Chest is clear with no increased work of breathing  There is no evidence of aortic aneurysm or renal artery bruits  Abdomen soft with no organomegaly or masses. No HJR  No clubbing, cyanosis or edema present.  Pedal pulses are intact   No ischemic skin changes are present . Fingernails/ toenails healthy   Alert and oriented. Not depressed.Strength, tone, DTRs reflexes normal          Assessment & Plan:  See Current Assessment & Plan in Problem List under specific Diagnosis

## 2014-08-09 ENCOUNTER — Other Ambulatory Visit: Payer: Self-pay | Admitting: Internal Medicine

## 2014-08-09 DIAGNOSIS — E1165 Type 2 diabetes mellitus with hyperglycemia: Principal | ICD-10-CM

## 2014-08-09 DIAGNOSIS — IMO0001 Reserved for inherently not codable concepts without codable children: Secondary | ICD-10-CM

## 2014-08-09 NOTE — Assessment & Plan Note (Signed)
Blood pressure goals reviewed. BMET 

## 2014-08-10 ENCOUNTER — Encounter: Payer: Self-pay | Admitting: Internal Medicine

## 2014-08-14 ENCOUNTER — Telehealth: Payer: Self-pay | Admitting: *Deleted

## 2014-08-14 NOTE — Telephone Encounter (Signed)
Please call and ask patient.  We could see her again or get an MRI.

## 2014-08-14 NOTE — Telephone Encounter (Signed)
Pt states she is having severe shoulder pain going up into her neck. Have  F/u appt for 08/22/14, but the pain is not getting any better. Currently taking the tramadol and using the ice. Pt is requesting md advisement on what to do next...Johny Chess

## 2014-08-14 NOTE — Telephone Encounter (Signed)
Spoke with pt, scheduled appt for 11a tomorrow.

## 2014-08-15 ENCOUNTER — Other Ambulatory Visit (INDEPENDENT_AMBULATORY_CARE_PROVIDER_SITE_OTHER): Payer: Managed Care, Other (non HMO)

## 2014-08-15 ENCOUNTER — Ambulatory Visit (INDEPENDENT_AMBULATORY_CARE_PROVIDER_SITE_OTHER): Payer: Managed Care, Other (non HMO) | Admitting: Family Medicine

## 2014-08-15 ENCOUNTER — Encounter: Payer: Self-pay | Admitting: Family Medicine

## 2014-08-15 VITALS — BP 116/80 | HR 101 | Ht 66.0 in | Wt 182.0 lb

## 2014-08-15 DIAGNOSIS — M25512 Pain in left shoulder: Secondary | ICD-10-CM

## 2014-08-15 DIAGNOSIS — M9981 Other biomechanical lesions of cervical region: Secondary | ICD-10-CM

## 2014-08-15 DIAGNOSIS — M25519 Pain in unspecified shoulder: Secondary | ICD-10-CM

## 2014-08-15 DIAGNOSIS — M67919 Unspecified disorder of synovium and tendon, unspecified shoulder: Secondary | ICD-10-CM

## 2014-08-15 DIAGNOSIS — M999 Biomechanical lesion, unspecified: Secondary | ICD-10-CM

## 2014-08-15 DIAGNOSIS — M7542 Impingement syndrome of left shoulder: Secondary | ICD-10-CM

## 2014-08-15 DIAGNOSIS — M542 Cervicalgia: Secondary | ICD-10-CM

## 2014-08-15 DIAGNOSIS — M719 Bursopathy, unspecified: Secondary | ICD-10-CM

## 2014-08-15 NOTE — Progress Notes (Signed)
[name] [address], [place] [address] Phone: [phone] Subjective:    CC: Left shoulder pain and neck painfollow up  IWP:[id number] Christine [name] is a 63 y.o. female coming in with complaint of left shoulder pain.  Patient was seen previously she was diagnosed with a calcific spur in the rotator cuff. Patient did have an injection and states that she is approximately 50% better. Patient even after physical therapy was having recurrent pain.  Patient's MRI did not show anything significant with very mild rotator cuff tendinopathy but no true tear, no labral tear.  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 The patient is having an acute exacerbation of the pain. Patient states most of it seems to be in the left shoulder again. Worse with certain movements and can radiate up into her neck. Patient states that the pain also radiates down her arm. Denies that there is any weakness but states that the arm as well as her head feels heavy overall. Has been trying the medication she's been given previously with no significant improvement. Patient states that it feels about the same as it did prior to her first injection with her shoulder. States it is difficult to sleep at night. Rates the pain is 9/10.  Patient at last visit and had osteopathic manipulation. Patient was given home exercises as well as postural exercises that were more to work on muscle imbalances. Patient states she was doing actually remarkably better until she traveled from [place]. Patient was sleeping on another bed and started having some discomfort again. Patient was playing with her granddaughter a significant amount of time. Overall still better than what she was but started to have the pain come back. No new symptoms.     Past medical history, social, surgical and family history all reviewed in  electronic medical record.   Review of Systems: No headache, visual changes, nausea, vomiting, diarrhea, constipation, dizziness, abdominal pain, skin rash, fevers, chills, night sweats, weight loss, swollen lymph nodes, body aches, joint swelling, muscle aches, chest pain, shortness of breath, mood changes.   Objective Blood pressure 116/80, pulse 101, height 5\' 6"  (1.676 m), weight 182 lb (82.555 kg), SpO2 99.00%.  General: No apparent distress alert and oriented x3 mood and affect normal, dressed appropriately.  HEENT: Pupils equal, extraocular movements intact  Respiratory: Patient's speak in full sentences and does not appear short of breath  Cardiovascular: No lower extremity edema, non tender, no erythema  Skin: Warm dry intact with no signs of infection or rash on extremities or on axial skeleton.  Abdomen: Soft nontender  Neuro: Cranial nerves II through XII are intact, neurovascularly intact in all extremities with 2+ DTRs and 2+ pulses.  Lymph: No lymphadenopathy of posterior or anterior cervical chain or axillae bilaterally.  Gait normal with good balance and coordination.  MSK:  Non tender with full range of motion and good stability and symmetric strength and tone of  elbows, wrist, hip, knee and ankles bilaterally.  Neck: Inspection unremarkable. No palpable stepoffs. Mildly positive Spurling's maneuver left side Mild limitation with left-sided rotation as well as left-sided side bending lacking the last 5 of extension as well. Grip strength and sensation normal in bilateral hands Strength good C4 to T1 distribution No sensory change to C4 to T1 Negative Hoffman sign bilaterally Reflexes normal Shoulder: left Inspection reveals no abnormalities, atrophy or asymmetry. Palpation is normal with  no tenderness over AC joint or bicipital groove. ROM is full in all planes passively. Rotator cuff strength normal throughout. signs of impingement with positive Neer and Hawkin's  tests, but negative empty can sign. Speeds and Yergason's tests normal. No labral pathology noted with negative Obrien's, negative clunk and good stability. Normal scapular function observed. No painful arc and no drop arm sign. No apprehension sign  MSK US performed of: left This study was ordered, performed, and interpreted by Charlann Boxer D.O.  Shoulder:   Supraspinatus:  Appears normal on long and transverse views, Bursal bulge seen with shoulder abduction on impingement view. Infraspinatus:  Appears normal on long and transverse views. Significant increase in Doppler flow Subscapularis:  Appears normal on long and transverse views. Positive bursa Teres Minor:  Appears normal on long and transverse views. AC joint:  Capsule undistended, no geyser sign. Glenohumeral Joint:  Appears normal without effusion. Glenoid Labrum:  Intact without visualized tears. Biceps Tendon:  Appears normal on long and transverse views, no fraying of tendon, tendon located in intertubercular groove, no subluxation with shoulder internal or external rotation.  Impression: Subacromial bursitis  Procedure: Real-time Ultrasound Guided Injection of left glenohumeral joint Device: GE Logiq E  Ultrasound guided injection is preferred based studies that show increased duration, increased effect, greater accuracy, decreased procedural pain, increased response rate with ultrasound guided versus blind injection.  Verbal informed consent obtained.  Time-out conducted.  Noted no overlying erythema, induration, or other signs of local infection.  Skin prepped in a sterile fashion.  Local anesthesia: Topical Ethyl chloride.  With sterile technique and under real time ultrasound guidance:  Joint visualized.  23g 1  inch needle inserted posterior approach. Pictures taken for needle placement. Patient did have injection of 2 cc of 1% lidocaine, 2 cc of 0.5% Marcaine, and 1.0 cc of Kenalog 40 mg/dL. Completed without  difficulty  Pain immediately resolved suggesting accurate placement of the medication.  Advised to call if fevers/chills, erythema, induration, drainage, or persistent bleeding.  Images permanently stored and available for review in the ultrasound unit.  Impression: Technically successful ultrasound guided injection.   OMT Physical Exam   Cervical  C2 flexed rotated and side bent right C7 flexed rotated and side bent left  Thoracic T1 extended rotated and side bent left with elevated first rib T7 extended rotated and side bent right  Lumbar L2 flexed rotated inside that right  Sacrum Right on right    Impression and Recommendations:     This case required medical decision making of moderate complexity.

## 2014-08-15 NOTE — Assessment & Plan Note (Signed)
Continues to have rotator cuff impingement-like syndrome. Patient was given injection again today because patient was not tolerating conservative therapy. Patient did have some mild improvement. Hopefully that this will last little bit longer. Patient's past medical history significant for fibromyalgia also complicates the picture. I do think there is also a possibility for radicular symptoms from patient's neck. We do have x-rays but no further imaging. Patient is to try these interventions and come back and see me again after starting home exercises, icing, and medications per orders in 2 weeks for further evaluation and treatment. Spent greater than 25 minutes with patient face-to-face and had greater than 50% of counseling including as described above in assessment and plan.

## 2014-08-15 NOTE — Assessment & Plan Note (Signed)
Decision today to treat with OMT was based on Physical Exam  After verbal consent patient was treated with HVLA, ME, FPR techniques in cervical, thoracic, lumbar and sacral as well as rib areas  Patient tolerated the procedure well with improvement in symptoms  Patient given exercises, stretches and lifestyle modifications  See medications in patient instructions if given  Patient will follow up in 2 weeks

## 2014-08-15 NOTE — Patient Instructions (Signed)
Good to see you You are doing good overall Some more swelling in the shoulder today and tried the injection Love your invention with the socks. Ice is your friend.  Otherwise would love to see you again in 2 weeks.

## 2014-08-15 NOTE — Assessment & Plan Note (Signed)
Patient's neck pain is somewhat concerning for cervical radiculopathy. Patient did tolerate some manipulation today which I hope will be helpful. We also discussed an icing regimen and patient was given another range of motion exercising patient will call in 48 hours then continuing to have pain I may consider further imaging. Patient though has responded well in the past and hopefully that this is just an exacerbation.

## 2014-08-16 ENCOUNTER — Ambulatory Visit (INDEPENDENT_AMBULATORY_CARE_PROVIDER_SITE_OTHER): Payer: Managed Care, Other (non HMO) | Admitting: Internal Medicine

## 2014-08-16 ENCOUNTER — Encounter: Payer: Self-pay | Admitting: Internal Medicine

## 2014-08-16 VITALS — BP 150/86 | HR 100 | Temp 97.4°F | Wt 179.8 lb

## 2014-08-16 DIAGNOSIS — E1165 Type 2 diabetes mellitus with hyperglycemia: Principal | ICD-10-CM

## 2014-08-16 DIAGNOSIS — IMO0001 Reserved for inherently not codable concepts without codable children: Secondary | ICD-10-CM

## 2014-08-16 MED ORDER — PIOGLITAZONE HCL 30 MG PO TABS: 30.0000 mg | ORAL_TABLET | Freq: Every day | ORAL | Status: DC

## 2014-08-16 NOTE — Progress Notes (Signed)
Pre visit review using our clinic review tool, if applicable. No additional management support is needed unless otherwise documented below in the visit note. 

## 2014-08-16 NOTE — Progress Notes (Signed)
   Subjective:    Patient ID: Christine [name], female    DOB: 1951-10-18, 63 y.o.   MRN: [id number]  HPI   Her sugars have risen after receiving 2 steroid injections 08/15/14 at the left shoulder.  This is the second time she received steroid injection in the shoulder; last was in May.  She has received physical therapy. MRI is planned if there is not improvement.  She is here to discuss options to treat her diabetes. She does not feel comfortable taking Invokana, because of the malpractice suits advertised on TV. She is concerned about hypoglycemia from this.  Her fasting glucoses have been 166-258; average for the last week was 197.  With her present diabetic regimen she has been able to bring her A1c down from 10.7 to 8%. I explained the dramatically improved risk associated with this.  She does have an appointment in one month to see the endocrinologist.   She has no history of cardiovascular compromise.   Review of Systems   Chest pain, palpitations, tachycardia, exertional dyspnea, paroxysmal nocturnal dyspnea, claudication or edema are absent.       Objective:   Physical Exam   Positive or pertinent findings include: She does have an S4 gallop cadence with a slight flow murmur.(Note: TSH 2.07 on 08/08/14)  General appearance :adequately nourished; in no distress. Eyes: No conjunctival inflammation or scleral icterus is present. Oral exam: Dental hygiene is good. Lips and gums are healthy appearing.There is no oropharyngeal erythema or exudate noted.  Heart:  regular rhythm. S1 and S2 normal without click or rub  Lungs:Chest clear to auscultation; no wheezes, rhonchi,rales ,or rubs present.No increased work of breathing.  Abdomen: bowel sounds normal, soft and non-tender without masses, organomegaly or hernias noted.  No guarding or rebound.  Skin:Warm & dry.  Intact without suspicious lesions or rashes ; no jaundice or tenting Lymphatic: No lymphadenopathy is noted  about the head, neck, axilla Psych: oriented X 3; slightly anxious (see comments re adverse drug effect concerns)            Assessment & Plan:  #1 diabetes, dramatic improvement but still not at goal..  Because of her concerns about the Invokana,; Actos will be initiated until she sees Dr. Loanne Drilling.

## 2014-08-16 NOTE — Patient Instructions (Signed)
Consider options as we discussed; start Actos if you feel comfortable. Record weight every Monday.

## 2014-08-22 ENCOUNTER — Ambulatory Visit: Payer: Managed Care, Other (non HMO) | Admitting: Family Medicine

## 2014-08-28 ENCOUNTER — Other Ambulatory Visit: Payer: Self-pay

## 2014-08-28 MED ORDER — GLUCOSE BLOOD VI STRP: ORAL_STRIP | Status: DC

## 2014-08-29 ENCOUNTER — Ambulatory Visit (INDEPENDENT_AMBULATORY_CARE_PROVIDER_SITE_OTHER): Payer: Managed Care, Other (non HMO)

## 2014-08-29 ENCOUNTER — Encounter: Payer: Self-pay | Admitting: Family Medicine

## 2014-08-29 ENCOUNTER — Ambulatory Visit (INDEPENDENT_AMBULATORY_CARE_PROVIDER_SITE_OTHER): Payer: Managed Care, Other (non HMO) | Admitting: Family Medicine

## 2014-08-29 VITALS — BP 132/78 | HR 100 | Ht 66.0 in | Wt 179.0 lb

## 2014-08-29 DIAGNOSIS — Z23 Encounter for immunization: Secondary | ICD-10-CM

## 2014-08-29 DIAGNOSIS — M999 Biomechanical lesion, unspecified: Secondary | ICD-10-CM

## 2014-08-29 DIAGNOSIS — M9981 Other biomechanical lesions of cervical region: Secondary | ICD-10-CM

## 2014-08-29 DIAGNOSIS — M542 Cervicalgia: Secondary | ICD-10-CM

## 2014-08-29 NOTE — Patient Instructions (Signed)
Good to see you Christine [name] is your friend.  continue what you are doing you are doing better Com back in 3 weeks.

## 2014-08-29 NOTE — Assessment & Plan Note (Signed)
Do believe that patient's neck pain and chronic fatigue as well as fibromyalgia are contributing to this discomfort overall. Patient though it is responding fairly well to the over-the-counter medications as well as the osteopathic manipulation. Patient was given new postural exercises that I think would be helpful. We discussed icing protocol that'll be helpful. Discussed certain positioning as well as at church could be contributing. Patient is to work on her posture while sitting. Patient and will come back and see me again in 3-4 weeks for further evaluation. Patient continues to do well we'll try to push her out further and further and extend duration between visits. Spent greater than 25 minutes with patient face-to-face and had greater than 50% of counseling including as described above in assessment and plan.

## 2014-08-29 NOTE — Assessment & Plan Note (Signed)
Decision today to treat with OMT was based on Physical Exam  After verbal consent patient was treated with HVLA, ME, FPR techniques in cervical, thoracic, lumbar and sacral areas  Patient tolerated the procedure well with improvement in symptoms  Patient given exercises, stretches and lifestyle modifications  See medications in patient instructions if given  Patient will follow up in 2 weeks 

## 2014-08-29 NOTE — Progress Notes (Signed)
[name] [address], [place] [address] Phone: [phone] Subjective:    CC: Left shoulder pain and neck painfollow up  [id number] Christine [name] is a 63 y.o. female coming in with complaint of left shoulder pain.  Patient was seen previously she was diagnosed with a calcific spur in the rotator cuff. Patient did have an injection and states that she is approximately 85% better. Patient states that she continues to have some discomfort when she is reaching behind her back but overall is doing relatively well.    Patient at last visit and had osteopathic manipulation. Patient was given home exercises as well as postural exercises that were more to work on muscle imbalances. Patient states that as long as she does the exercises fairly regularly she notes that her neck does relatively well. Does have some mild discomfort from time to time. Patient does have an area on x-ray of her neck at C6-C7 that could be contributing to some of her pain. Denies any significant radiation down her arm and denies any numbness.     Past medical history, social, surgical and family history all reviewed in electronic medical record.   Review of Systems: No headache, visual changes, nausea, vomiting, diarrhea, constipation, dizziness, abdominal pain, skin rash, fevers, chills, night sweats, weight loss, swollen lymph nodes, body aches, joint swelling, muscle aches, chest pain, shortness of breath, mood changes.   Objective Blood pressure 132/78, pulse 100, height 5\' 6"  (1.676 m), weight 179 lb (81.194 kg), SpO2 96.00%.  General: No apparent distress alert and oriented x3 mood and affect normal, dressed appropriately.  HEENT: Pupils equal, extraocular movements intact  Respiratory: Patient's speak in full sentences and does not appear short of breath  Cardiovascular: No lower extremity edema, non tender, no erythema  Skin: Warm dry intact with no signs of infection  or rash on extremities or on axial skeleton.  Abdomen: Soft nontender  Neuro: Cranial nerves II through XII are intact, neurovascularly intact in all extremities with 2+ DTRs and 2+ pulses.  Lymph: No lymphadenopathy of posterior or anterior cervical chain or axillae bilaterally.  Gait normal with good balance and coordination.  MSK:  Non tender with full range of motion and good stability and symmetric strength and tone of  elbows, wrist, hip, knee and ankles bilaterally.  Neck: Inspection unremarkable. No palpable stepoffs. Negative Spurling's maneuver left side this is an improvement from previous exam Mild limitation with left-sided rotation as well as left-sided side bending lacking the last 5 of extension as well. Grip strength and sensation normal in bilateral hands Strength good C4 to T1 distribution No sensory change to C4 to T1 Negative Hoffman sign bilaterally Reflexes normal Shoulder: left Inspection reveals no abnormalities, atrophy or asymmetry. Palpation is normal with no tenderness over AC joint or bicipital groove. ROM is full in all planes passively. Rotator cuff strength normal throughout. Mild impingement sign still left but significantly better than previous exam Speeds and Yergason's tests normal. No labral pathology noted with negative Obrien's, negative clunk and good stability. Normal scapular function observed. No painful arc and no drop arm sign. No apprehension sign   OMT Physical Exam   Cervical  C2 flexed rotated and side bent right C7 flexed rotated and side bent left  Thoracic T3 extended rotated and side bent left  T5 extended rotated and side bent right  Lumbar L2 flexed rotated inside that right  Sacrum Right on right    Impression  and Recommendations:     This case required medical decision making of moderate complexity.

## 2014-09-05 ENCOUNTER — Ambulatory Visit (INDEPENDENT_AMBULATORY_CARE_PROVIDER_SITE_OTHER): Payer: Managed Care, Other (non HMO) | Admitting: Endocrinology

## 2014-09-05 ENCOUNTER — Encounter: Payer: Self-pay | Admitting: Endocrinology

## 2014-09-05 VITALS — BP 122/86 | HR 97 | Temp 98.3°F | Ht 66.0 in | Wt 180.0 lb

## 2014-09-05 DIAGNOSIS — IMO0001 Reserved for inherently not codable concepts without codable children: Secondary | ICD-10-CM

## 2014-09-05 DIAGNOSIS — E1165 Type 2 diabetes mellitus with hyperglycemia: Principal | ICD-10-CM

## 2014-09-05 MED ORDER — INSULIN GLARGINE 100 UNIT/ML SOLOSTAR PEN: 35.0000 [IU] | PEN_INJECTOR | SUBCUTANEOUS | Status: DC

## 2014-09-05 NOTE — Progress Notes (Signed)
Subjective:    Patient ID: Christine [name], female    DOB: 09/13/1951, 63 y.o.   MRN: [id number]  HPI pt states DM was dx'ed in 2006; he has mild if any neuropathy of the lower extremities; he is unaware of any associated TIA; she has been on insulin since 2014; pt says her diet is good, but exercise is not; she has never had GDM, pancreatitis, severe hypoglycemia or DKA.  she brings a record of her fasting cbg's which i have reviewed today.  All are in the low-100's, until several recent steroid injections.  She wants to take insulin just QD.   Past Medical History  Diagnosis Date  . Allergic rhinitis   . Sleep disorder     [name] May  . Diabetes mellitus   . Migraine headache     quiescent  . TIA (transient ischemic attack)   . Hyperlipidemia   . HTN (hypertension)   . History of recurrent UTIs     [name]    Past Surgical History  Procedure Laterality Date  . Polypectomy  2002    benign, hyperplastic polyp; rectal bleeding 2008  . Total abdominal hysterectomy  1983    BSO for endometriosis  . Coronary angioplasty  1997    for chest pain- negative   . Tonsillectomy    . Wisdom tooth extraction    . Colonoscopy      Dr Carlean [name]; hemorrhoids    History   Social History  . Marital Status: Married    Spouse Name: N/A    Number of Children: N/A  . Years of Education: N/A   Occupational History  . Data Compensation Specialist     Social History Main Topics  . Smoking status: Never Smoker   . Smokeless tobacco: Not on file  . Alcohol Use: No  . Drug Use: No  . Sexual Activity: Not on file   Other Topics Concern  . Not on file   Social History Narrative   Regular exercise- no     Current Outpatient Prescriptions on File Prior to Visit  Medication Sig Dispense Refill  . aspirin 81 MG tablet Take 81 mg by mouth daily.        [name] atorvastatin (LIPITOR) 20 MG tablet Take 1 tablet (20 mg total) by mouth daily.  90 tablet  3  . busPIRone (BUSPAR) 15 MG tablet  Take 15 mg by mouth daily.       . Cholecalciferol (VITAMIN D3) 5000 UNITS CAPS Take 5,000 Units by mouth daily.       . cyclobenzaprine (FLEXERIL) 10 MG tablet Take 1 tablet (10 mg total) by mouth at bedtime as needed. For muscle spasm.  30 tablet  11  . desvenlafaxine (PRISTIQ) 100 MG 24 hr tablet Take 100 mg by mouth daily.      [name] glucose blood (ONE TOUCH ULTRA TEST) test strip Check blood sugar twice daily as directed DX: 250.00  100 each  3  . Lancets (ONETOUCH ULTRASOFT) lancets Check blood sugar once daily. Dx code: 250.00  100 each  12  . LORazepam (ATIVAN) 0.5 MG tablet Take 0.5 mg by mouth 2 (two) times daily as needed. For anxiety.      [name] losartan (COZAAR) 100 MG tablet TAKE 1 TABLET EVERY DAY  90 tablet  3  . omeprazole (PRILOSEC) 20 MG capsule Take 1 capsule (20 mg total) by mouth daily. 1 pre breakfast as needed  90 capsule  1  . pioglitazone (  ACTOS) 30 MG tablet Take 1 tablet (30 mg total) by mouth daily.  30 tablet  1  . traMADol (ULTRAM) 50 MG tablet Take 1 tablet (50 mg total) by mouth every 12 (twelve) hours as needed for severe pain.  60 tablet  0  . vitamin B-12 (CYANOCOBALAMIN) 250 MCG tablet Take 250 mcg by mouth daily.        . Diclofenac Sodium (PENNSAID) 2 % SOLN Place 2 application onto the skin 2 (two) times daily.  112 g  3  . fish oil-omega-3 fatty acids 1000 MG capsule Take 1 g by mouth daily.        No current facility-administered medications on file prior to visit.    Allergies  Allergen Reactions  . Vioxx [Rofecoxib]     Excess BP which caused TIA  . Prednisone     REACTION:  Mental status changes No associated rash or fever  . Colesevelam     REACTION: Leg Pain    Family History  Problem Relation Age of Onset  . Colon cancer Maternal Aunt   . Diabetes Paternal Uncle   . Heart attack Father 20  . COPD Mother   . Lung cancer Brother     smoker  . Mental illness      niece, committed suicide.  [name] Heart attack      brother X 2; @ 37 & 28  .  Stroke Neg Hx     BP 122/86  Pulse 97  Temp(Src) 98.3 F (36.8 C) (Oral)  Ht 5\' 6"  (1.676 m)  Wt 180 lb (81.647 kg)  BMI 29.07 kg/m2  SpO2 94%  Review of Systems denies weight loss, blurry vision, headache, chest pain, sob, n/v, urinary frequency, muscle cramps, excessive diaphoresis, depression, cold intolerance, and rhinorrhea.  Depression is well-controlled.  She has easy bruising.    Objective:   Physical Exam VS: see vs page GEN: no distress HEAD: head: no deformity eyes: no periorbital swelling, no proptosis external nose and ears are normal mouth: no lesion seen NECK: supple, thyroid is not enlarged CHEST WALL: no deformity LUNGS:  Clear to auscultation CV: reg rate and rhythm, no murmur ABD: abdomen is soft, nontender.  no hepatosplenomegaly.  not distended.  no hernia MUSCULOSKELETAL: muscle bulk and strength are grossly normal.  no obvious joint swelling.  gait is normal and steady EXTEMITIES: no deformity.  no ulcer on the feet.  feet are of normal color and temp.  no edema PULSES: dorsalis pedis intact bilat.  no carotid bruit NEURO:  cn 2-12 grossly intact.   readily moves all 4's.  sensation is intact to touch on the feet SKIN:  Normal texture and temperature.  No rash or suspicious lesion is visible.   NODES:  None palpable at the neck PSYCH: alert, well-oriented.  Does not appear anxious nor depressed.   Lab Results  Component Value Date   HGBA1C 8.0* 08/08/2014   i reviewed electrocardiogram (10/11/12)  i have reviewed the following outside records: Office notes    Assessment & Plan:  DM: new to me.  Moderate exacerbation.    Patient is advised the following: Patient Instructions  good diet and exercise habits significanly improve the control of your diabetes.  please let me know if you wish to be referred to a dietician.  high blood sugar is very risky to your health.  you should see an eye doctor and dentist every year.  You are at higher than  average risk for  pneumonia and hepatitis-B.  You should be vaccinated against both.   controlling your blood pressure and cholesterol drastically reduces the damage diabetes does to your body.  this also applies to quitting smoking.  please discuss these with your doctor.   check your blood sugar twice a day.  vary the time of day when you check, between before the 3 meals, and at bedtime.  also check if you have symptoms of your blood sugar being too high or too low.  please keep a record of the readings and bring it to your next appointment here.  You can write it on any piece of paper.  please call us sooner if your blood sugar goes below 70, or if you have a lot of readings over 200.   For now, please stop the janumet, and: Change the levemir to lantus 35 units each morning, and: Please continue the same actos.   Please come back for a follow-up appointment in 1 month.

## 2014-09-05 NOTE — Patient Instructions (Addendum)
good diet and exercise habits significanly improve the control of your diabetes.  please let me know if you wish to be referred to a dietician.  high blood sugar is very risky to your health.  you should see an eye doctor and dentist every year.  You are at higher than average risk for pneumonia and hepatitis-B.  You should be vaccinated against both.   controlling your blood pressure and cholesterol drastically reduces the damage diabetes does to your body.  this also applies to quitting smoking.  please discuss these with your doctor.   check your blood sugar twice a day.  vary the time of day when you check, between before the 3 meals, and at bedtime.  also check if you have symptoms of your blood sugar being too high or too low.  please keep a record of the readings and bring it to your next appointment here.  You can write it on any piece of paper.  please call us sooner if your blood sugar goes below 70, or if you have a lot of readings over 200.   For now, please stop the janumet, and: Change the levemir to lantus 35 units each morning, and:  Please continue the same actos.   Please come back for a follow-up appointment in 1 month.

## 2014-09-10 ENCOUNTER — Encounter: Payer: Self-pay | Admitting: Endocrinology

## 2014-09-26 ENCOUNTER — Ambulatory Visit (INDEPENDENT_AMBULATORY_CARE_PROVIDER_SITE_OTHER): Payer: Managed Care, Other (non HMO) | Admitting: Family Medicine

## 2014-09-26 ENCOUNTER — Encounter: Payer: Self-pay | Admitting: Family Medicine

## 2014-09-26 VITALS — BP 132/80 | HR 89 | Ht 66.0 in | Wt 181.0 lb

## 2014-09-26 DIAGNOSIS — M9903 Segmental and somatic dysfunction of lumbar region: Secondary | ICD-10-CM

## 2014-09-26 DIAGNOSIS — M9902 Segmental and somatic dysfunction of thoracic region: Secondary | ICD-10-CM

## 2014-09-26 DIAGNOSIS — M999 Biomechanical lesion, unspecified: Secondary | ICD-10-CM

## 2014-09-26 DIAGNOSIS — M542 Cervicalgia: Secondary | ICD-10-CM

## 2014-09-26 DIAGNOSIS — M9901 Segmental and somatic dysfunction of cervical region: Secondary | ICD-10-CM

## 2014-09-26 DIAGNOSIS — M9908 Segmental and somatic dysfunction of rib cage: Secondary | ICD-10-CM

## 2014-09-26 HISTORY — DX: Biomechanical lesion, unspecified: M99.9

## 2014-09-26 NOTE — Patient Instructions (Signed)
Good to see you Chew gum regularly.  Start flonase daily to help with inner ear.  You are doing great.  COntinue the posture exercises and try to keep the neck neutral  Continue the exercises See me in 4 weeks.  If pain worse we can always do an MRI

## 2014-09-26 NOTE — Progress Notes (Signed)
[name] [place], [place] [address] Phone: [phone] Subjective:    CC: Left shoulder pain and neck painfollow up  [id number] Christine [name] is a 63 y.o. female coming in with complaint of left shoulder pain.  Patient was seen previously she was diagnosed with a calcific spur in the rotator cuff. Patient has been doing very well with over-the-counter medications as well as using tramadol on an as-needed basis. Patient shoulder continues to do well it only hurts when she reaches away from her body in trying to hold weight.   Patient at last visit and had osteopathic manipulation. Patient was given home exercises as well as postural exercises that were more to work on muscle imbalances. Patient states that as long as she does the exercises fairly regularly she notes that her neck does relatively well. Does have some mild discomfort from time to time. Patient does have an area on x-ray of her neck at C6-C7 that could be contributing to some of her pain. Denies any significant radiation down her arm and denies any numbness. Patient states though after manipulation she usually feels good for approximately 2-3 weeks and then the pain started coming back. Patient states this weekend after starting to do more activity as well as refinishing her cabinets in her kitchen she had more pain. Denies any nighttime awakening.     Past medical history, social, surgical and family history all reviewed in electronic medical record.   Review of Systems: No headache, visual changes, nausea, vomiting, diarrhea, constipation, dizziness, abdominal pain, skin rash, fevers, chills, night sweats, weight loss, swollen lymph nodes, body aches, joint swelling, muscle aches, chest pain, shortness of breath, mood changes.   Objective Blood pressure 132/80, pulse 89, height 5\' 6"  (1.676 m), weight 181 lb (82.101 kg), SpO2 96.00%.  General: No apparent distress alert and  oriented x3 mood and affect normal, dressed appropriately.  HEENT: Pupils equal, extraocular movements intact  Respiratory: Patient's speak in full sentences and does not appear short of breath  Cardiovascular: No lower extremity edema, non tender, no erythema  Skin: Warm dry intact with no signs of infection or rash on extremities or on axial skeleton.  Abdomen: Soft nontender  Neuro: Cranial nerves II through XII are intact, neurovascularly intact in all extremities with 2+ DTRs and 2+ pulses.  Lymph: No lymphadenopathy of posterior or anterior cervical chain or axillae bilaterally.  Gait normal with good balance and coordination.  MSK:  Non tender with full range of motion and good stability and symmetric strength and tone of  elbows, wrist, hip, knee and ankles bilaterally.  Neck: Inspection unremarkable. No palpable stepoffs. Negative Spurling's maneuver left side this is an improvement from previous exam Mild limitation with left-sided rotation as well as left-sided side bending lacking the last 5 of extension as well. Grip strength and sensation normal in bilateral hands Strength good C4 to T1 distribution No sensory change to C4 to T1 Negative Hoffman sign bilaterally Reflexes normal Shoulder: left Inspection reveals no abnormalities, atrophy or asymmetry. Palpation is normal with no tenderness over AC joint or bicipital groove. ROM is full in all planes passively. Rotator cuff strength normal throughout. Mild impingement sign still left but significantly better than previous exam Speeds and Yergason's tests normal. No labral pathology noted with negative Obrien's, negative clunk and good stability. Normal scapular function observed. No painful arc and no drop arm sign. No apprehension sign   OMT Physical Exam  Cervical  C2 flexed rotated and side bent right C7 flexed rotated and side bent left  Thoracic T3 extended rotated and side bent left inhaled third rib T5  extended rotated and side bent right  Lumbar L2 flexed rotated inside that right  Sacrum Right on right    Impression and Recommendations:     This case required medical decision making of moderate complexity.

## 2014-09-26 NOTE — Assessment & Plan Note (Signed)
Patient's neck pain is multifactorial with her vitamin D deficiency, fibromyalgia, as well as some moderate osteoarthritic changes and has been seen on x-ray previously. Patient is responding to osteopathic manipulation and I do not think she is doing home exercises on a regular basis. There is some mild noncompliance. Patient who declined formal physical therapy. The discussed with patient at this time about different postural changes that she can make throughout the day that would likely be beneficial. We discussed an icing protocol and also that if patient has any worsening pain then we would consider advanced imaging including MRI of the cervical and likely thoracic spine. Patient was doing relatively well and affect is see her back again in 4 weeks for further evaluation and treatment.

## 2014-09-26 NOTE — Assessment & Plan Note (Signed)
Decision today to treat with OMT was based on Physical Exam  After verbal consent patient was treated with HVLA, ME, FPR techniques in cervical, thoracic, lumbar and sacral areas  Patient tolerated the procedure well with improvement in symptoms  Patient given exercises, stretches and lifestyle modifications  See medications in patient instructions if given  Patient will follow up in 4 weeks 

## 2014-10-05 ENCOUNTER — Ambulatory Visit (INDEPENDENT_AMBULATORY_CARE_PROVIDER_SITE_OTHER): Payer: Managed Care, Other (non HMO) | Admitting: Endocrinology

## 2014-10-05 ENCOUNTER — Encounter: Payer: Self-pay | Admitting: Endocrinology

## 2014-10-05 VITALS — BP 122/80 | HR 116 | Temp 98.3°F | Ht 66.0 in | Wt 183.0 lb

## 2014-10-05 DIAGNOSIS — N390 Urinary tract infection, site not specified: Secondary | ICD-10-CM | POA: Insufficient documentation

## 2014-10-05 DIAGNOSIS — N3090 Cystitis, unspecified without hematuria: Secondary | ICD-10-CM

## 2014-10-05 HISTORY — DX: Urinary tract infection, site not specified: N39.0

## 2014-10-05 MED ORDER — CIPROFLOXACIN HCL 500 MG PO TABS: 500.0000 mg | ORAL_TABLET | Freq: Two times a day (BID) | ORAL | Status: DC

## 2014-10-05 NOTE — Progress Notes (Signed)
Subjective:    Patient ID: Christine [name], female    DOB: Jul 29, 1951, 63 y.o.   MRN: [id number]  HPI Pt returns for f/u of diabetes mellitus: DM type: Insulin-requiring type 2 Dx'ed: 2542 Complications: TIA Therapy: insulin since GDM: never DKA: never Severe hypoglycemia: never Pancreatitis: never Other: She wants to take insulin just QD.  pioglitizone is for NASH. Interval history:  she brings a record of her cbg's which i have reviewed today.  It varies from 75-160.  It is in general higher as the day goes on.   Pt states 2 days of moderate foul-smelling urine, but no assoc fever.  No pain at the urethra Past Medical History  Diagnosis Date  . Allergic rhinitis   . Sleep disorder     [name] May  . Diabetes mellitus   . Migraine headache     quiescent  . TIA (transient ischemic attack)   . Hyperlipidemia   . HTN (hypertension)   . History of recurrent UTIs     [name]    Past Surgical History  Procedure Laterality Date  . Polypectomy  2002    benign, hyperplastic polyp; rectal bleeding 2008  . Total abdominal hysterectomy  1983    BSO for endometriosis  . Coronary angioplasty  1997    for chest pain- negative   . Tonsillectomy    . Wisdom tooth extraction    . Colonoscopy      Dr Carlean [name]; hemorrhoids    History   Social History  . Marital Status: Married    Spouse Name: N/A    Number of Children: N/A  . Years of Education: N/A   Occupational History  . Data Compensation Specialist     Social History Main Topics  . Smoking status: Never Smoker   . Smokeless tobacco: Not on file  . Alcohol Use: No  . Drug Use: No  . Sexual Activity: Not on file   Other Topics Concern  . Not on file   Social History Narrative   Regular exercise- no     Current Outpatient Prescriptions on File Prior to Visit  Medication Sig Dispense Refill  . aspirin 81 MG tablet Take 81 mg by mouth daily.        [name] atorvastatin (LIPITOR) 20 MG tablet Take 1 tablet (20  mg total) by mouth daily.  90 tablet  3  . busPIRone (BUSPAR) 15 MG tablet Take 15 mg by mouth daily.       . Cholecalciferol (VITAMIN D3) 5000 UNITS CAPS Take 5,000 Units by mouth daily.       . cyclobenzaprine (FLEXERIL) 10 MG tablet Take 1 tablet (10 mg total) by mouth at bedtime as needed. For muscle spasm.  30 tablet  11  . desvenlafaxine (PRISTIQ) 100 MG 24 hr tablet Take 100 mg by mouth daily.      . Diclofenac Sodium (PENNSAID) 2 % SOLN Place 2 application onto the skin 2 (two) times daily.  112 g  3  . fish oil-omega-3 fatty acids 1000 MG capsule Take 1 g by mouth daily.       [name] glucose blood (ONE TOUCH ULTRA TEST) test strip Check blood sugar twice daily as directed DX: 250.00  100 each  3  . Lancets (ONETOUCH ULTRASOFT) lancets Check blood sugar once daily. Dx code: 250.00  100 each  12  . LORazepam (ATIVAN) 0.5 MG tablet Take 0.5 mg by mouth 2 (two) times daily as needed. For anxiety.      [name]  losartan (COZAAR) 100 MG tablet TAKE 1 TABLET EVERY DAY  90 tablet  3  . omeprazole (PRILOSEC) 20 MG capsule Take 1 capsule (20 mg total) by mouth daily. 1 pre breakfast as needed  90 capsule  1  . pioglitazone (ACTOS) 30 MG tablet Take 1 tablet (30 mg total) by mouth daily.  30 tablet  1  . traMADol (ULTRAM) 50 MG tablet Take 1 tablet (50 mg total) by mouth every 12 (twelve) hours as needed for severe pain.  60 tablet  0  . vitamin B-12 (CYANOCOBALAMIN) 250 MCG tablet Take 250 mcg by mouth daily.         No current facility-administered medications on file prior to visit.    Allergies  Allergen Reactions  . Vioxx [Rofecoxib]     Excess BP which caused TIA  . Prednisone     REACTION:  Mental status changes No associated rash or fever  . Colesevelam     REACTION: Leg Pain    Family History  Problem Relation Age of Onset  . Colon cancer Maternal Aunt   . Diabetes Paternal Uncle   . Heart attack Father 54  . COPD Mother   . Lung cancer Brother     smoker  . Mental illness       niece, committed suicide.  [name] Heart attack      brother X 2; @ 57 & 63  . Stroke Neg Hx     BP 122/80  Pulse 116  Temp(Src) 98.3 F (36.8 C) (Oral)  Ht 5\' 6"  (1.676 m)  Wt 183 lb (83.008 kg)  BMI 29.55 kg/m2  SpO2 93%  Review of Systems She denies hypoglycemia.  She has gained 2 lbs.    Objective:   Physical Exam VITAL SIGNS:  See vs page GENERAL: no distress Pulses: dorsalis pedis intact bilat.   Feet: no deformity.  no edema Skin:  no ulcer on the feet.  normal color and temp.  neuro: sensation is intact to touch on the feet.      Lab Results  Component Value Date   HGBA1C 8.0* 08/08/2014        Assessment & Plan:  UTI: new DM: Based on the pattern of her cbg's, she needs some adjustment in her therapy  Patient is advised the following: Patient Instructions  check your blood sugar twice a day.  vary the time of day when you check, between before the 3 meals, and at bedtime.  also check if you have symptoms of your blood sugar being too high or too low.  please keep a record of the readings and bring it to your next appointment here.  You can write it on any piece of paper.  please call us sooner if your blood sugar goes below 70, or if you have a lot of readings over 200.   For now, please reduce the lantus to 30 units each morning, and:  Please continue the same actos.   Please come back for a follow-up appointment in 1 month.   i have sent a prescription to your pharmacy, for an antibiotic.

## 2014-10-05 NOTE — Patient Instructions (Addendum)
check your blood sugar twice a day.  vary the time of day when you check, between before the 3 meals, and at bedtime.  also check if you have symptoms of your blood sugar being too high or too low.  please keep a record of the readings and bring it to your next appointment here.  You can write it on any piece of paper.  please call us sooner if your blood sugar goes below 70, or if you have a lot of readings over 200.   For now, please reduce the lantus to 30 units each morning, and:  Please continue the same actos.   Please come back for a follow-up appointment in 1 month.   i have sent a prescription to your pharmacy, for an antibiotic.

## 2014-10-06 LAB — URINALYSIS, ROUTINE W REFLEX MICROSCOPIC
Bilirubin Urine: NEGATIVE
Glucose, UA: NEGATIVE mg/dL
HGB URINE DIPSTICK: NEGATIVE
Ketones, ur: NEGATIVE mg/dL
NITRITE: NEGATIVE
PH: 6 (ref 5.0–8.0)
Protein, ur: NEGATIVE mg/dL
Specific Gravity, Urine: 1.01 (ref 1.005–1.030)
Urobilinogen, UA: 0.2 mg/dL (ref 0.0–1.0)

## 2014-10-06 LAB — URINALYSIS, MICROSCOPIC ONLY
Bacteria, UA: NONE SEEN
CASTS: NONE SEEN
Crystals: NONE SEEN
Squamous Epithelial / LPF: NONE SEEN
WBC, UA: >50 WBC/hpf — AB (ref <3)

## 2014-10-08 ENCOUNTER — Other Ambulatory Visit: Payer: Self-pay

## 2014-10-08 DIAGNOSIS — E1165 Type 2 diabetes mellitus with hyperglycemia: Secondary | ICD-10-CM

## 2014-10-08 DIAGNOSIS — IMO0002 Reserved for concepts with insufficient information to code with codable children: Secondary | ICD-10-CM

## 2014-10-08 LAB — CULTURE, URINE COMPREHENSIVE: Colony Count: >=100000

## 2014-10-08 MED ORDER — PIOGLITAZONE HCL 30 MG PO TABS: 30.0000 mg | ORAL_TABLET | Freq: Every day | ORAL | Status: DC

## 2014-10-24 ENCOUNTER — Ambulatory Visit: Payer: Managed Care, Other (non HMO) | Admitting: Family Medicine

## 2014-10-25 ENCOUNTER — Ambulatory Visit (INDEPENDENT_AMBULATORY_CARE_PROVIDER_SITE_OTHER): Payer: Managed Care, Other (non HMO) | Admitting: Family Medicine

## 2014-10-25 ENCOUNTER — Encounter: Payer: Self-pay | Admitting: Family Medicine

## 2014-10-25 VITALS — BP 132/82 | HR 94 | Ht 66.0 in | Wt 184.0 lb

## 2014-10-25 DIAGNOSIS — M9908 Segmental and somatic dysfunction of rib cage: Secondary | ICD-10-CM

## 2014-10-25 DIAGNOSIS — M999 Biomechanical lesion, unspecified: Secondary | ICD-10-CM

## 2014-10-25 DIAGNOSIS — M542 Cervicalgia: Secondary | ICD-10-CM

## 2014-10-25 DIAGNOSIS — M9902 Segmental and somatic dysfunction of thoracic region: Secondary | ICD-10-CM

## 2014-10-25 DIAGNOSIS — M9901 Segmental and somatic dysfunction of cervical region: Secondary | ICD-10-CM

## 2014-10-25 MED ORDER — GABAPENTIN 100 MG PO CAPS: 100.0000 mg | ORAL_CAPSULE | Freq: Every day | ORAL | Status: DC

## 2014-10-25 NOTE — Assessment & Plan Note (Signed)
Patient's neck pain is likely secondary to him osteophytic changes as well as poor postural changes. Encourage her to continue the exercises on a regular basis. Patient does take muscle relaxer at night but I would like patient to potentially take gabapentin in case this is secondary to some nerve root impingement. Patient will start 100 mg at night and will not titrate until he see patient again. Patient is very sensitive to medications. Patient continues to respond fairly well to osteopathic manipulation and we'll see her again on a regular basis in 3-4 weeks for further evaluation and treatment.

## 2014-10-25 NOTE — Progress Notes (Signed)
[name] [place], [place] [address] Phone: [phone] Subjective:    CC: Left shoulder pain and neck painfollow up  [id number] Christine [name] is a 63 y.o. female coming in with complaint of left shoulder pain.  Patient was seen previously she was diagnosed with a calcific spur in the rotator cuff. Patient has been doing very well with over-the-counter medications as well as using tramadol on an as-needed basis. Patient states she continues to have some mild discomfort in the left shoulder pain.    Patient at last visit and had osteopathic manipulation. Patient was given home exercises as well as postural exercises that were more to work on muscle imbalances. Patient states that as long as she does the exercises fairly regularly she notes that her neck does relatively well. Does have some mild discomfort from time to time. Patient does have an area on x-ray of her neck at C6-C7 that could be contributing to some of her pain. Denies any significant radiation down her arm and denies any numbness.   Patient has been responding to osteopathic manipulation.  Patient states that for approximately 2 weeks usually does fairly well and then started having some mild increasing pain again. Patient states that usually when she is looking down for long amount of time such as in church she has more discomfort. States that it seems to be radiating towards both shoulders now. Still not stopping her from any activities. Uses the topical anti-inflammatory as needed. States that it can be uncomfortable at night as well.    Past medical history, social, surgical and family history all reviewed in electronic medical record.   Review of Systems: No headache, visual changes, nausea, vomiting, diarrhea, constipation, dizziness, abdominal pain, skin rash, fevers, chills, night sweats, weight loss, swollen lymph nodes, body aches, joint swelling, muscle aches, chest  pain, shortness of breath, mood changes.   Objective Blood pressure 132/82, pulse 94, height 5\' 6"  (1.676 m), weight 184 lb (83.462 kg), SpO2 97 %.  General: No apparent distress alert and oriented x3 mood and affect normal, dressed appropriately.  HEENT: Pupils equal, extraocular movements intact  Respiratory: Patient's speak in full sentences and does not appear short of breath  Cardiovascular: No lower extremity edema, non tender, no erythema  Skin: Warm dry intact with no signs of infection or rash on extremities or on axial skeleton.  Abdomen: Soft nontender  Neuro: Cranial nerves II through XII are intact, neurovascularly intact in all extremities with 2+ DTRs and 2+ pulses.  Lymph: No lymphadenopathy of posterior or anterior cervical chain or axillae bilaterally.  Gait normal with good balance and coordination.  MSK:  Non tender with full range of motion and good stability and symmetric strength and tone of  elbows, wrist, hip, knee and ankles bilaterally.  Neck: Inspection unremarkable. No palpable stepoffs. Negative Spurling's maneuver left side this is an improvement from previous exam Mild limitation with left-sided rotation as well as left-sided side bending lacking the last 5 of extension as well. Grip strength and sensation normal in bilateral hands Strength good C4 to T1 distribution No sensory change to C4 to T1 Negative Hoffman sign bilaterally Reflexes normal Shoulder: left Inspection reveals no abnormalities, atrophy or asymmetry. Palpation is normal with no tenderness over AC joint or bicipital groove. ROM is full in all planes passively. Rotator cuff strength normal throughout. Mild impingement sign still left but significantly better than previous exam Speeds and Yergason's tests normal. No  labral pathology noted with negative Obrien's, negative clunk and good stability. Normal scapular function observed. No painful arc and no drop arm sign. No apprehension  sign Contralateral side unremarkable with no significant change from previous exam   OMT Physical Exam   Cervical  C7 flexed rotated and side bent left  Thoracic T3 extended rotated and side bent left inhaled third rib T5 extended rotated and side bent right  Lumbar L2 flexed rotated inside that right  Sacrum neutral    Impression and Recommendations:     This case required medical decision making of moderate complexity.

## 2014-10-25 NOTE — Patient Instructions (Addendum)
Good to see you Christine [name] is your friend Try gabapentin 100mg  at night just to take the edge off.  Continue the exercises See me again in 3 weeks.

## 2014-10-25 NOTE — Assessment & Plan Note (Signed)
Decision today to treat with OMT was based on Physical Exam  After verbal consent patient was treated with HVLA, ME, FPR techniques in cervical, thoracic, lumbar  areas  Patient tolerated the procedure well with improvement in symptoms  Patient given exercises, stretches and lifestyle modifications  See medications in patient instructions if given  Patient will follow up in 4 weeks 

## 2014-11-09 ENCOUNTER — Encounter: Payer: Self-pay | Admitting: Family Medicine

## 2014-11-12 MED ORDER — TRAMADOL HCL 50 MG PO TABS: 50.0000 mg | ORAL_TABLET | Freq: Two times a day (BID) | ORAL | Status: DC | PRN

## 2014-11-13 ENCOUNTER — Other Ambulatory Visit: Payer: Self-pay

## 2014-11-13 MED ORDER — LOSARTAN POTASSIUM 100 MG PO TABS: ORAL_TABLET | ORAL | Status: DC

## 2014-11-15 ENCOUNTER — Ambulatory Visit (INDEPENDENT_AMBULATORY_CARE_PROVIDER_SITE_OTHER): Payer: Managed Care, Other (non HMO) | Admitting: Family Medicine

## 2014-11-15 ENCOUNTER — Encounter: Payer: Self-pay | Admitting: Family Medicine

## 2014-11-15 VITALS — BP 116/74 | HR 80 | Ht 66.0 in | Wt 187.0 lb

## 2014-11-15 DIAGNOSIS — M999 Biomechanical lesion, unspecified: Secondary | ICD-10-CM

## 2014-11-15 DIAGNOSIS — M9902 Segmental and somatic dysfunction of thoracic region: Secondary | ICD-10-CM

## 2014-11-15 DIAGNOSIS — M542 Cervicalgia: Secondary | ICD-10-CM

## 2014-11-15 DIAGNOSIS — M9908 Segmental and somatic dysfunction of rib cage: Secondary | ICD-10-CM

## 2014-11-15 DIAGNOSIS — M9901 Segmental and somatic dysfunction of cervical region: Secondary | ICD-10-CM

## 2014-11-15 NOTE — Assessment & Plan Note (Signed)
Decision today to treat with OMT was based on Physical Exam  After verbal consent patient was treated with HVLA, ME, FPR techniques in cervical, thoracic, lumbar  areas  Patient tolerated the procedure well with improvement in symptoms  Patient given exercises, stretches and lifestyle modifications  See medications in patient instructions if given  Patient will follow up in 4 weeks 

## 2014-11-15 NOTE — Assessment & Plan Note (Signed)
Patient does have known degenerative changes but is responding well to conservative therapy. Patient was given more strengthening exercises today. She will continue with topical anti-inflammatories as well as the over-the-counter medications. Patient does have medications for any type of flares. Discussed with patient because she is doing so well we will spacer out of 4 week intervals. His lungs patient continues to do relatively well we will continue to decrease the frequency of office visits. Patient is traveling again though in approximately 4 weeks and then seems to be one of the exacerbating problems. Encourage her to continue the exercises when possible.  Spent greater than 25 minutes with patient face-to-face and had greater than 50% of counseling including as described above in assessment and plan.

## 2014-11-15 NOTE — Patient Instructions (Signed)
Good to see you You are dong well overall  Keep it up and enjoy the grand baby!

## 2014-11-15 NOTE — Progress Notes (Signed)
[name] Christine [name], Christine [name] Phone: [phone] Subjective:    CC: neck painfollow up  Christine [name] is a 63 y.o. female coming in with neck pain.  Patient at last visit and had osteopathic manipulation. Patient was given home exercises as well as postural exercises that were more to work on muscle imbalances. Patient states that as long as she does the exercises fairly regularly she notes that her neck does relatively well. Does have some mild discomfort from time to time. Patient does have an area on x-ray of her neck at C6-C7 that could be contributing to some of her pain. Denies any significant radiation down her arm and denies any numbness. Patient did recently trouble for the holiday and being in the car for a long amount of time did give her some discomfort. Patient also was picking up her grandchild and noticed some mild discomfort in her upper back. States though that as long she did her exercises she seems to be doing relatively well. No radiation into the left shoulder as she previously was having.    Past medical history, social, surgical and family history all reviewed in electronic medical record.   Review of Systems: No headache, visual changes, nausea, vomiting, diarrhea, constipation, dizziness, abdominal pain, skin rash, fevers, chills, night sweats, weight loss, swollen lymph nodes, body aches, joint swelling, muscle aches, chest pain, shortness of breath, mood changes.   Objective Blood pressure 116/74, pulse 80, height 5\' 6"  (1.676 m), weight 187 lb (84.823 kg), SpO2 95 %.  General: No apparent distress alert and oriented x3 mood and affect normal, dressed appropriately.  HEENT: Pupils equal, extraocular movements intact  Respiratory: Patient's speak in full sentences and does not appear short of breath  Cardiovascular: No lower extremity edema, non tender, no erythema  Skin: Warm dry intact with no  signs of infection or rash on extremities or on axial skeleton.  Abdomen: Soft nontender  Neuro: Cranial nerves II through XII are intact, neurovascularly intact in all extremities with 2+ DTRs and 2+ pulses.  Lymph: No lymphadenopathy of posterior or anterior cervical chain or axillae bilaterally.  Gait normal with good balance and coordination.  MSK:  Non tender with full range of motion and good stability and symmetric strength and tone of  elbows, wrist, hip, knee and ankles bilaterally.  Neck: Inspection unremarkable. No palpable stepoffs. Negative Spurling's maneuver left side this is an improvement from previous exam Near-complete range of motion today which is an improvement Grip strength and sensation normal in bilateral hands Strength good C4 to T1 distribution No sensory change to C4 to T1 Negative Hoffman sign bilaterally Reflexes normal Shoulder: left Inspection reveals no abnormalities, atrophy or asymmetry. Palpation is normal with no tenderness over AC joint or bicipital groove. ROM is full in all planes passively. Rotator cuff strength normal throughout. Mild impingement sign still left still present Speeds and Yergason's tests normal. No labral pathology noted with negative Obrien's, negative clunk and good stability. Normal scapular function observed. No painful arc and no drop arm sign. No apprehension sign Contralateral side unremarkable with no significant change from previous exam   OMT Physical Exam   Cervical  C7 flexed rotated and side bent left  Thoracic T3 extended rotated and side bent left inhaled third rib T7 extended rotated and side bent right  Lumbar L2 flexed rotated inside that right  Sacrum neutral    Impression and Recommendations:  This case required medical decision making of moderate complexity.

## 2014-11-16 ENCOUNTER — Encounter: Payer: Managed Care, Other (non HMO) | Admitting: Internal Medicine

## 2014-11-19 ENCOUNTER — Encounter: Payer: Self-pay | Admitting: Family Medicine

## 2014-11-21 ENCOUNTER — Encounter: Payer: Self-pay | Admitting: Internal Medicine

## 2014-11-21 ENCOUNTER — Encounter: Payer: Self-pay | Admitting: Family Medicine

## 2014-11-21 ENCOUNTER — Ambulatory Visit (INDEPENDENT_AMBULATORY_CARE_PROVIDER_SITE_OTHER): Payer: Managed Care, Other (non HMO) | Admitting: Family Medicine

## 2014-11-21 ENCOUNTER — Ambulatory Visit (INDEPENDENT_AMBULATORY_CARE_PROVIDER_SITE_OTHER)
Admission: RE | Admit: 2014-11-21 | Discharge: 2014-11-21 | Disposition: A | Discharge status: Home / Self Care | Payer: Managed Care, Other (non HMO) | Source: Ambulatory Visit | Attending: Family Medicine | Admitting: Family Medicine

## 2014-11-21 ENCOUNTER — Other Ambulatory Visit (INDEPENDENT_AMBULATORY_CARE_PROVIDER_SITE_OTHER): Payer: Managed Care, Other (non HMO)

## 2014-11-21 ENCOUNTER — Ambulatory Visit (INDEPENDENT_AMBULATORY_CARE_PROVIDER_SITE_OTHER): Payer: Managed Care, Other (non HMO) | Admitting: Internal Medicine

## 2014-11-21 VITALS — HR 94 | Ht 66.0 in | Wt 185.0 lb

## 2014-11-21 VITALS — BP 128/90 | HR 94 | Resp 14 | Ht 66.0 in | Wt 185.0 lb

## 2014-11-21 DIAGNOSIS — Z8739 Personal history of other diseases of the musculoskeletal system and connective tissue: Secondary | ICD-10-CM

## 2014-11-21 DIAGNOSIS — R0781 Pleurodynia: Secondary | ICD-10-CM | POA: Insufficient documentation

## 2014-11-21 DIAGNOSIS — IMO0002 Reserved for concepts with insufficient information to code with codable children: Secondary | ICD-10-CM

## 2014-11-21 DIAGNOSIS — E785 Hyperlipidemia, unspecified: Secondary | ICD-10-CM

## 2014-11-21 DIAGNOSIS — D126 Benign neoplasm of colon, unspecified: Secondary | ICD-10-CM

## 2014-11-21 DIAGNOSIS — E1165 Type 2 diabetes mellitus with hyperglycemia: Secondary | ICD-10-CM

## 2014-11-21 DIAGNOSIS — B372 Candidiasis of skin and nail: Secondary | ICD-10-CM

## 2014-11-21 DIAGNOSIS — I10 Essential (primary) hypertension: Secondary | ICD-10-CM

## 2014-11-21 LAB — CBC WITH DIFFERENTIAL/PLATELET
BASOS PCT: 0.7 % (ref 0.0–3.0)
Basophils Absolute: 0 10*3/uL (ref 0.0–0.1)
Eosinophils Absolute: 0.1 10*3/uL (ref 0.0–0.7)
Eosinophils Relative: 1.6 % (ref 0.0–5.0)
HCT: 40.5 % (ref 36.0–46.0)
Hemoglobin: 13.2 g/dL (ref 12.0–15.0)
LYMPHS ABS: 2.3 10*3/uL (ref 0.7–4.0)
LYMPHS PCT: 30.7 % (ref 12.0–46.0)
MCHC: 32.6 g/dL (ref 30.0–36.0)
MCV: 87.3 fl (ref 78.0–100.0)
MONO ABS: 0.5 10*3/uL (ref 0.1–1.0)
MONOS PCT: 6.7 % (ref 3.0–12.0)
Neutro Abs: 4.4 10*3/uL (ref 1.4–7.7)
Neutrophils Relative %: 60.3 % (ref 43.0–77.0)
PLATELETS: 246 10*3/uL (ref 150.0–400.0)
RBC: 4.65 Mil/uL (ref 3.87–5.11)
RDW: 15.2 % (ref 11.5–15.5)
WBC: 7.4 10*3/uL (ref 4.0–10.5)

## 2014-11-21 LAB — HEPATIC FUNCTION PANEL
ALT: 21 U/L (ref 0–35)
AST: 20 U/L (ref 0–37)
Albumin: 4.2 g/dL (ref 3.5–5.2)
Alkaline Phosphatase: 62 U/L (ref 39–117)
BILIRUBIN TOTAL: 0.4 mg/dL (ref 0.2–1.2)
Bilirubin, Direct: 0 mg/dL (ref 0.0–0.3)
Total Protein: 7.3 g/dL (ref 6.0–8.3)

## 2014-11-21 LAB — MICROALBUMIN / CREATININE URINE RATIO
Creatinine,U: 48 mg/dL
Microalb Creat Ratio: 2.3 mg/g (ref 0.0–30.0)
Microalb, Ur: 1.1 mg/dL (ref 0.0–1.9)

## 2014-11-21 LAB — LIPID PANEL
CHOLESTEROL: 192 mg/dL (ref 0–200)
HDL: 68.8 mg/dL (ref >39.00)
LDL Cholesterol: 104 mg/dL — ABNORMAL HIGH (ref 0–99)
NonHDL: 123.2
Total CHOL/HDL Ratio: 3
Triglycerides: 95 mg/dL (ref 0.0–149.0)
VLDL: 19 mg/dL (ref 0.0–40.0)

## 2014-11-21 LAB — VITAMIN D 25 HYDROXY (VIT D DEFICIENCY, FRACTURES): VITD: 36.73 ng/mL (ref 30.00–100.00)

## 2014-11-21 LAB — HEMOGLOBIN A1C: Hgb A1c MFr Bld: 7.7 % — ABNORMAL HIGH (ref 4.6–6.5)

## 2014-11-21 MED ORDER — NYSTATIN-TRIAMCINOLONE 100000-0.1 UNIT/GM-% EX OINT: 1.0000 "application " | TOPICAL_OINTMENT | Freq: Two times a day (BID) | CUTANEOUS | Status: DC

## 2014-11-21 MED ORDER — PREDNISONE 50 MG PO TABS: 50.0000 mg | ORAL_TABLET | Freq: Every day | ORAL | Status: DC

## 2014-11-21 MED ORDER — KETOCONAZOLE 2 % EX CREA: TOPICAL_CREAM | CUTANEOUS | Status: DC

## 2014-11-21 MED ORDER — ACYCLOVIR 400 MG PO TABS: 400.0000 mg | ORAL_TABLET | Freq: Three times a day (TID) | ORAL | Status: DC

## 2014-11-21 NOTE — Progress Notes (Signed)
Pre visit review using our clinic review tool, if applicable. No additional management support is needed unless otherwise documented below in the visit note. 
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
## 2014-11-21 NOTE — Assessment & Plan Note (Signed)
As per Dr Loanne Drilling; he requested A1c  Ophth exam pending next week

## 2014-11-21 NOTE — Progress Notes (Signed)
  [name] [place], [place] [address] Phone: [phone] Subjective:    CC: new rib pain  ITG:[id number] Christine [name] is a 63 y.o. female coming in with   Onset of right rib pain. Patient states that this started 3 days ago. Patient does not remember any true injury. Patient states that it is starting to give her discomfort to light palpation. Patient states she woke up this morning and had a very mild rash in the area. Patient points this area mostly being under the breast line on the right side. Seems to go from midline and go to the anterior lateral line she states. Patient states that there is no soreness with deep breaths. States that certain movements that can cause discomfort. Patient denies any injury. Denies any recent fevers or chills. Denies any associated with eating. Patient does have her gallbladder. Patient has tried some of the tramadol with minimal improvement with the pain..    Past medical history, social, surgical and family history all reviewed in electronic medical record.   Review of Systems: No headache, visual changes, nausea, vomiting, diarrhea, constipation, dizziness, abdominal pain, skin rash, fevers, chills, night sweats, weight loss, swollen lymph nodes, body aches, joint swelling, muscle aches, chest pain, shortness of breath, mood changes.   Objective Pulse 94, height 5\' 6"  (1.676 m), weight 185 lb (83.915 kg), SpO2 96 %.  General: No apparent distress alert and oriented x3 mood and affect normal, dressed appropriately.  HEENT: Pupils equal, extraocular movements intact  Respiratory: Patient's speak in full sentences and does not appear short of breath  Cardiovascular: No lower extremity edema, non tender, no erythema  Skin:  Underneath patient's rest tissue there is an area of erythema. No vesicles noted. This is very tender to palpation. Mild's and breakdown noted. Patient though is tender from the costal  sternal junction all the way to the anterior lateral  But not to the exam. No masses palpated. Neurovascularly intact. Abdomen: Soft nontender  No organomegaly Neuro: Cranial nerves II through XII are intact, neurovascularly intact in all extremities with 2+ DTRs and 2+ pulses.  Lymph: No lymphadenopathy of posterior or anterior cervical chain or axillae bilaterally.  Gait normal with good balance and coordination.  MSK:  Non tender with full range of motion and good stability and symmetric strength and tone of  elbows, wrist, hip, knee and ankles bilaterally.      Impression and Recommendations:     This case required medical decision making of moderate complexity.

## 2014-11-21 NOTE — Assessment & Plan Note (Signed)
BMD & vitamin D level

## 2014-11-21 NOTE — Patient Instructions (Signed)
Keep the area as dry as possible. Apply Nizoral twice a day until rash is gone. Your next office appointment will be determined based upon review of your pending labs. Those instructions will be transmitted to you through My Chart  Followup as needed for your acute issue. Please report any significant change in your symptoms.

## 2014-11-21 NOTE — Assessment & Plan Note (Signed)
CBC

## 2014-11-21 NOTE — Assessment & Plan Note (Signed)
Patient's differential for her rib pain. Patient may have a rib fracture and x-rays are ordered. More likely patient may be having a shingle attack. We discussed that if this occurs patient will start taking acyclovir. Patient has had a history of costochondritis and patient feels that this is likely the problem. Patient was given some prednisone. Discussed though that if she takes prednisone that this could make the shingles worse if this does occur. Patient understands. Patientgiven topical fungal medications as well.Patient will come back in 1 week for further evaluation.  Spent greater than 25 minutes with patient face-to-face and had greater than 50% of counseling including as described above in assessment and plan.

## 2014-11-21 NOTE — Assessment & Plan Note (Signed)
Lipids, LFTs,CK 

## 2014-11-21 NOTE — Progress Notes (Signed)
Subjective:    Patient ID: Christine [name], female    DOB: 12-Apr-1951, 63 y.o.   MRN: [id number]  HPI She is here to assess active health issues & conditions. PMH, FH, & Social history verified & updated   She has been compliant with her antihypertensive medication and statin without adverse effect  She is on a modified heart healthy diet; she does eat some red meat but no fried foods. She engages in no regular exercise program  Blood pressure at home was range 120-130/70-85.  She is to have an ophthalmologic exam next week. She also will follow up with her endocrinologist in reference to her diabetes. He has requested that we perform an A1c and urine microalbumin. Significantly she has had 3 cortisone injections this year for musculoskeletal issues.  Her last A1c was 8% in August of this year. At that time BMET and TSH were normal  She has had some elevation of the liver function tests in the past. Lipids are overdue.    Review of Systems   Epistaxis,exertional chest pain, palpitations, exertional dyspnea, claudication, paroxysmal nocturnal dyspnea, or edema absent. No GI symptoms , memory loss or myalgias .  She has had some right inframammary area pain , worse with deep  breathing. The pain began 3 days before she noted a local rash. The differential is possible costochondritis versus early herpes zoster.This is being addressed by Dr Charlann Boxer  She had headaches while on Effexor. This was discontinued and the Pristiq restarted by her psychiatric nurse practitioner.       Objective:   Physical Exam  Gen.: Healthy and well-nourished in appearance. Alert, appropriate and cooperative throughout exam. Appears younger than stated age  Head: Normocephalic without obvious abnormalities  Eyes: No corneal or conjunctival inflammation noted. Pupils equal round reactive to light and accommodation. Extraocular motion intact.  Ears: External  ear exam reveals no significant lesions or  deformities. Canals clear .TMs normal. Hearing is grossly normal bilaterally. Nose: External nasal exam reveals no deformity or inflammation. Nasal mucosa are pink and moist. No lesions or exudates noted.   Mouth: Oral mucosa and oropharynx reveal no lesions or exudates. Teeth in good repair. Neck: No deformities, masses, or tenderness noted. Range of motion & Thyroid  normal. Lungs: Normal respiratory effort; chest expands symmetrically. Lungs are clear to auscultation without rales, wheezes, or increased work of breathing. Heart: Normal rate and rhythm. Normal S1 and S2. No gallop, click, or rub. No murmur. Abdomen: Bowel sounds normal; abdomen soft and nontender. No masses, organomegaly or hernias noted. Genitalia: S/P TAH & BSO for endometriosis                            Musculoskeletal/extremities: No deformity or scoliosis noted of  the thoracic or lumbar spine. . No clubbing, cyanosis, edema, or significant extremity  deformity noted.  Range of motion normal . Tone & strength normal. Hand joints reveal mild bilateral 5th digit flexion changes.  Fingernail / toenail health good. Able to lie down & sit up w/o help.  Negative SLR bilaterally Vascular: Carotid, radial artery, dorsalis pedis and  posterior tibial pulses are full and equal. No bruits present. Neurologic: Alert and oriented x3. Deep tendon reflexes symmetrical and normal. Slight bilateral hand tremor Gait normal      Skin: Faint linear erythema under R breast with minimal maceration. No vesicles. Lymph: No cervical, axillary lymphadenopathy present. Psych: Mood and affect are normal.  Normally interactive                                                                                        Assessment & Plan:  See Current Assessment & Plan in Problem List under specific Diagnosis #2 Candida R inframammary area #3 pleuritic R chest pain; as per Dr Tamala Julian See orders & AVS

## 2014-11-23 ENCOUNTER — Ambulatory Visit (INDEPENDENT_AMBULATORY_CARE_PROVIDER_SITE_OTHER): Payer: Managed Care, Other (non HMO) | Admitting: Endocrinology

## 2014-11-23 ENCOUNTER — Encounter: Payer: Self-pay | Admitting: Endocrinology

## 2014-11-23 VITALS — BP 128/70 | HR 93 | Temp 98.5°F | Ht 66.0 in | Wt 185.0 lb

## 2014-11-23 DIAGNOSIS — IMO0002 Reserved for concepts with insufficient information to code with codable children: Secondary | ICD-10-CM

## 2014-11-23 DIAGNOSIS — E1165 Type 2 diabetes mellitus with hyperglycemia: Secondary | ICD-10-CM

## 2014-11-23 MED ORDER — PIOGLITAZONE HCL 15 MG PO TABS: 15.0000 mg | ORAL_TABLET | Freq: Every day | ORAL | Status: DC

## 2014-11-23 MED ORDER — GLUCOSE BLOOD VI STRP: 1.0000 | ORAL_STRIP | Freq: Two times a day (BID) | Status: DC

## 2014-11-23 NOTE — Progress Notes (Signed)
Subjective:    Patient ID: Christine [name], female    DOB: May 21, 1951, 63 y.o.   MRN: [id number]  HPI Pt returns for f/u of diabetes mellitus: DM type: Insulin-requiring type 2 Dx'ed: 9702 Complications: TIA Therapy: insulin GDM: never DKA: never Severe hypoglycemia: never Pancreatitis: never Other: She wants to take insulin just QD.  pioglitizone is for NASH. Interval history: she brings a record of her cbg's which i have reviewed today.  It varies from 77-141, in the am, which is the only time of day she checks.   Past Medical History  Diagnosis Date  . Allergic rhinitis   . Sleep disorder     [name] May  . Diabetes mellitus   . Migraine headache     quiescent  . TIA (transient ischemic attack)   . Hyperlipidemia   . HTN (hypertension)   . History of recurrent UTIs     [name]    Past Surgical History  Procedure Laterality Date  . Polypectomy  2002    benign, hyperplastic polyp; rectal bleeding 2008  . Total abdominal hysterectomy  1983    BSO for endometriosis  . Coronary angioplasty  1997    for chest pain- negative   . Tonsillectomy    . Wisdom tooth extraction    . Colonoscopy      Dr Carlean [name]; hemorrhoids    History   Social History  . Marital Status: Married    Spouse Name: N/A    Number of Children: N/A  . Years of Education: N/A   Occupational History  . Data Compensation Specialist     Social History Main Topics  . Smoking status: Never Smoker   . Smokeless tobacco: Not on file  . Alcohol Use: No  . Drug Use: No  . Sexual Activity: Not on file   Other Topics Concern  . Not on file   Social History Narrative   Regular exercise- no     Current Outpatient Prescriptions on File Prior to Visit  Medication Sig Dispense Refill  . acyclovir (ZOVIRAX) 400 MG tablet Take 1 tablet (400 mg total) by mouth 3 (three) times daily. 21 tablet 0  . aspirin 81 MG tablet Take 81 mg by mouth daily.      [name] atorvastatin (LIPITOR) 20 MG tablet Take  1 tablet (20 mg total) by mouth daily. 90 tablet 3  . busPIRone (BUSPAR) 15 MG tablet Take 15 mg by mouth daily.     . Cholecalciferol (VITAMIN D3) 5000 UNITS CAPS Take 5,000 Units by mouth daily.     . cyclobenzaprine (FLEXERIL) 10 MG tablet Take 1 tablet (10 mg total) by mouth at bedtime as needed. For muscle spasm. 30 tablet 11  . desvenlafaxine (PRISTIQ) 100 MG 24 hr tablet Take 100 mg by mouth daily.    . fish oil-omega-3 fatty acids 1000 MG capsule Take 1 g by mouth daily.     . Insulin Glargine (LANTUS) 100 UNIT/ML Solostar Pen Inject 30 Units into the skin every morning. And pen needles 1/day 250.01    . ketoconazole (NIZORAL) 2 % cream Apply bid prn 15 g 0  . Lancets (ONETOUCH ULTRASOFT) lancets Check blood sugar once daily. Dx code: 250.00 100 each 12  . LORazepam (ATIVAN) 0.5 MG tablet Take 0.5 mg by mouth 2 (two) times daily as needed. For anxiety.    [name] losartan (COZAAR) 100 MG tablet TAKE 1 TABLET EVERY DAY 90 tablet 1  . nystatin-triamcinolone ointment (MYCOLOG) Apply 1  application topically 2 (two) times daily. 30 g 0  . omeprazole (PRILOSEC) 20 MG capsule Take 1 capsule (20 mg total) by mouth daily. 1 pre breakfast as needed 90 capsule 1  . pioglitazone (ACTOS) 30 MG tablet Take 1 tablet (30 mg total) by mouth daily. 30 tablet 5  . predniSONE (DELTASONE) 50 MG tablet Take 1 tablet (50 mg total) by mouth daily. 5 tablet 0  . traMADol (ULTRAM) 50 MG tablet Take 1 tablet (50 mg total) by mouth every 12 (twelve) hours as needed for severe pain. 60 tablet 0  . vitamin B-12 (CYANOCOBALAMIN) 250 MCG tablet Take 250 mcg by mouth daily.       No current facility-administered medications on file prior to visit.    Allergies  Allergen Reactions  . Vioxx [Rofecoxib]     Excess BP which caused TIA  . Prednisone     REACTION:  Mental status changes No associated rash or fever  . Colesevelam     REACTION: Leg Pain    Family History  Problem Relation Age of Onset  . Colon cancer  Maternal Aunt   . Diabetes Paternal Uncle   . Heart attack Father 64  . COPD Mother   . Lung cancer Brother     smoker  . Mental illness      niece, committed suicide.  [name] Heart attack      brother X 2; @ 30 & 3  . Stroke Neg Hx     BP 128/70 mmHg  Pulse 93  Temp(Src) 98.5 F (36.9 C) (Oral)  Ht 5\' 6"  (1.676 m)  Wt 185 lb (83.915 kg)  BMI 29.87 kg/m2  SpO2 96%    Review of Systems She denies hypoglycemia and weight change    Objective:   Physical Exam VITAL SIGNS:  See vs page GENERAL: no distress Pulses: dorsalis pedis intact bilat.   Feet: no deformity.  no edema Skin:  no ulcer on the feet.  normal color and temp. Neuro: sensation is intact to touch on the feet.     Lab Results  Component Value Date   HGBA1C 7.7* 11/21/2014   Lab Results  Component Value Date   ALT 21 11/21/2014   AST 20 11/21/2014   ALKPHOS 62 11/21/2014   BILITOT 0.4 11/21/2014      Assessment & Plan:  DM: mild exacerbation.  However, this is the best control this pt should aim for, given this regimen, which does match insulin to her changing needs throughout the day NASH: well-controlled Noncompliance with cbg recording: I'll work around this as best I can.   Patient is advised the following: Patient Instructions  check your blood sugar twice a day.  vary the time of day when you check, between before the 3 meals, and at bedtime.  also check if you have symptoms of your blood sugar being too high or too low.  please keep a record of the readings and bring it to your next appointment here.  You can write it on any piece of paper.  please call us sooner if your blood sugar goes below 70, or if you have a lot of readings over 200.   For now, please continue the lantus, 30 units each morning.   Please come back for a follow-up appointment in 3 months.   Please reduce the pioglitizone to 15 mg daily.  You may need to temporarily take more lantus while you are on the prednisone, and for 1  week  after.  Please call us if you need help with this.

## 2014-11-23 NOTE — Patient Instructions (Addendum)
check your blood sugar twice a day.  vary the time of day when you check, between before the 3 meals, and at bedtime.  also check if you have symptoms of your blood sugar being too high or too low.  please keep a record of the readings and bring it to your next appointment here.  You can write it on any piece of paper.  please call us sooner if your blood sugar goes below 70, or if you have a lot of readings over 200.   For now, please continue the lantus, 30 units each morning.   Please come back for a follow-up appointment in 3 months.   Please reduce the pioglitizone to 15 mg daily.  You may need to temporarily take more lantus while you are on the prednisone, and for 1 week after.  Please call us if you need help with this.

## 2014-11-29 ENCOUNTER — Ambulatory Visit (INDEPENDENT_AMBULATORY_CARE_PROVIDER_SITE_OTHER): Payer: Managed Care, Other (non HMO) | Admitting: Family Medicine

## 2014-11-29 ENCOUNTER — Encounter: Payer: Self-pay | Admitting: Family Medicine

## 2014-11-29 VITALS — BP 130/80 | HR 96 | Ht 66.0 in | Wt 187.0 lb

## 2014-11-29 DIAGNOSIS — R0781 Pleurodynia: Secondary | ICD-10-CM

## 2014-11-29 DIAGNOSIS — M9901 Segmental and somatic dysfunction of cervical region: Secondary | ICD-10-CM

## 2014-11-29 DIAGNOSIS — M999 Biomechanical lesion, unspecified: Secondary | ICD-10-CM

## 2014-11-29 DIAGNOSIS — M9902 Segmental and somatic dysfunction of thoracic region: Secondary | ICD-10-CM

## 2014-11-29 DIAGNOSIS — M542 Cervicalgia: Secondary | ICD-10-CM

## 2014-11-29 DIAGNOSIS — M9908 Segmental and somatic dysfunction of rib cage: Secondary | ICD-10-CM

## 2014-11-29 NOTE — Assessment & Plan Note (Signed)
Continues to respond fairly well to conservative therapy. Patient encouraged to continue the home exercises. Patient will come back again in 3 weeks for further evaluation.

## 2014-11-29 NOTE — Progress Notes (Signed)
  [name] [place], [place] [address] Phone: [phone] Subjective:    CC: new rib pain  BWL:[id number] Christine [name] is a 63 y.o. female coming in for follow-up of rib pain. Patient was seen and had more of a costochondritis as well as a topical fungal infection. Patient has been doing the cream and did take the prednisone. Patient states that she is feeling approximately 75% better. Still has a pulling sensation. Denies any numbness or tingling, and patient states that she does think she is improving.    Past medical history, social, surgical and family history all reviewed in electronic medical record.   Review of Systems: No headache, visual changes, nausea, vomiting, diarrhea, constipation, dizziness, abdominal pain, skin rash, fevers, chills, night sweats, weight loss, swollen lymph nodes, body aches, joint swelling, muscle aches, chest pain, shortness of breath, mood changes.   Objective Blood pressure 130/80, pulse 96, height 5\' 6"  (1.676 m), weight 187 lb (84.823 kg), SpO2 97 %.  General: No apparent distress alert and oriented x3 mood and affect normal, dressed appropriately.  HEENT: Pupils equal, extraocular movements intact  Respiratory: Patient's speak in full sentences and does not appear short of breath  Cardiovascular: No lower extremity edema, non tender, no erythema  Skin:  No rash noted in the skin today. But not to the exam. No masses palpated. Neurovascularly intact. Abdomen: Soft nontender  No organomegaly Neuro: Cranial nerves II through XII are intact, neurovascularly intact in all extremities with 2+ DTRs and 2+ pulses.  Lymph: No lymphadenopathy of posterior or anterior cervical chain or axillae bilaterally.  Gait normal with good balance and coordination.  MSK:  Non tender with full range of motion and good stability and symmetric strength and tone of  elbows, wrist, hip, knee and ankles bilaterally.    OMT  Physical Exam   Cervical  C7 flexed rotated and side bent left  Thoracic T3 extended rotated and side bent left inhaled third rib T7 extended rotated and side bent right  Lumbar L2 flexed rotated inside that right  Sacrum neutral    Impression and Recommendations:     This case required medical decision making of moderate complexity.

## 2014-11-29 NOTE — Assessment & Plan Note (Signed)
Patient is improving at this time. I do think the patient did have a costochondritis as well as the possibility for a fibromyalgia flare. Patient did have a topical fungal infection that seems to be resolved at this time. We discussed avoiding irritation and possibly using barrier cream for now. We discussed icing regimen. Patient and will come back and see me again in 3 weeks for further evaluation.

## 2014-11-29 NOTE — Patient Instructions (Signed)
Good to see you Have a great holiday! Ice is your friend Try talcum powder under the breast line in the morning and see if that helps.  Keep doing what you are doing.  See you next year!

## 2014-11-29 NOTE — Assessment & Plan Note (Signed)
Decision today to treat with OMT was based on Physical Exam  After verbal consent patient was treated with HVLA, ME, FPR techniques in cervical, thoracic, lumbar areas  Patient tolerated the procedure well with improvement in symptoms  Patient given exercises, stretches and lifestyle modifications  See medications in patient instructions if given  Patient will follow up in 3 weeks                  

## 2014-12-12 ENCOUNTER — Other Ambulatory Visit: Payer: Self-pay

## 2014-12-12 DIAGNOSIS — M797 Fibromyalgia: Secondary | ICD-10-CM

## 2014-12-12 MED ORDER — CYCLOBENZAPRINE HCL 10 MG PO TABS: 10.0000 mg | ORAL_TABLET | Freq: Every evening | ORAL | Status: DC | PRN

## 2014-12-12 NOTE — Telephone Encounter (Signed)
#  30 Prn only

## 2014-12-21 ENCOUNTER — Encounter: Payer: Self-pay | Admitting: Family Medicine

## 2014-12-21 ENCOUNTER — Ambulatory Visit (INDEPENDENT_AMBULATORY_CARE_PROVIDER_SITE_OTHER): Payer: Medicare Other | Admitting: Family Medicine

## 2014-12-21 VITALS — BP 132/84 | HR 89 | Ht 66.0 in | Wt 185.0 lb

## 2014-12-21 DIAGNOSIS — M9901 Segmental and somatic dysfunction of cervical region: Secondary | ICD-10-CM

## 2014-12-21 DIAGNOSIS — M9908 Segmental and somatic dysfunction of rib cage: Secondary | ICD-10-CM | POA: Diagnosis not present

## 2014-12-21 DIAGNOSIS — M9902 Segmental and somatic dysfunction of thoracic region: Secondary | ICD-10-CM | POA: Diagnosis not present

## 2014-12-21 DIAGNOSIS — M999 Biomechanical lesion, unspecified: Secondary | ICD-10-CM

## 2014-12-21 DIAGNOSIS — M542 Cervicalgia: Secondary | ICD-10-CM

## 2014-12-21 NOTE — Patient Instructions (Signed)
Good to see you The grand baby is great! Continue what you are doing.  Have fun at the [employer]!!!! See me in 3 weeks.

## 2014-12-21 NOTE — Progress Notes (Signed)
  [name] [place], [place] [address] Phone: [phone] Subjective:    CC: Neck and upper back pain follow-up  [id number] Christine [name] is a 64 y.o. female coming in for follow-up of her neck pain. Patient has been seen me for osteopathic manipulation. Patient did go out of town for the holidays and did play with her 35-month-old green cane. Patient has been doing this on a very regular basis. Patient states that she has noticed when she looks down for longer than 5 minutes she has more of the discomfort. Patient states it is tolerable. Patient does have to take tramadol intermittently. Patient denies any radiation down the arm or any numbness or the pain in the shoulder that she's had previously. Overall she does think that is getting better or more manageable at least.   Past medical history, social, surgical and family history all reviewed in electronic medical record.   Review of Systems: No headache, visual changes, nausea, vomiting, diarrhea, constipation, dizziness, abdominal pain, skin rash, fevers, chills, night sweats, weight loss, swollen lymph nodes, body aches, joint swelling, muscle aches, chest pain, shortness of breath, mood changes.   Objective Blood pressure 132/84, pulse 89, height 5\' 6"  (1.676 m), weight 185 lb (83.915 kg), SpO2 96 %.  General: No apparent distress alert and oriented x3 mood and affect normal, dressed appropriately.  HEENT: Pupils equal, extraocular movements intact  Respiratory: Patient's speak in full sentences and does not appear short of breath  Cardiovascular: No lower extremity edema, non tender, no erythema  Skin:  No rash noted in the skin today. But not to the exam. No masses palpated. Neurovascularly intact. Abdomen: Soft nontender  No organomegaly Neuro: Cranial nerves II through XII are intact, neurovascularly intact in all extremities with 2+ DTRs and 2+ pulses.  Lymph: No lymphadenopathy of  posterior or anterior cervical chain or axillae bilaterally.  Gait normal with good balance and coordination.  MSK:  Non tender with full range of motion and good stability and symmetric strength and tone of  elbows, wrist, hip, knee and ankles bilaterally.    OMT Physical Exam  Cervical  C2 flexed rotated and side bent right C7 flexed rotated and side bent left  Thoracic T3 extended rotated and side bent left inhaled third rib T7 extended rotated and side bent right  Lumbar L2 flexed rotated inside that right with tight hip flexors bilaterally  Sacrum Left on left    Impression and Recommendations:     This case required medical decision making of moderate complexity.

## 2014-12-21 NOTE — Assessment & Plan Note (Signed)
And do think the patient's neck pain is multifactorial. We have gotten x-rays previously in the past. Patient continues to respond fairly well to osteopathic manipulation. Patient encouraged to try to change positioning when she is reading or in church to try to be in a more neutral position. Anytime she is looking down on a more frequent basis this is difficult for her. Patient continues to have difficulties especially positional and we may want to consider further workup but no bruits have been noted. I do not think that this is vascular in nature. Denies any numbness or tingling or any other symptoms I wouldn't think that this is neurologic in nature. Patient will come back in 3 weeks for further evaluation and treatment.

## 2014-12-21 NOTE — Assessment & Plan Note (Signed)

## 2014-12-25 DIAGNOSIS — R457 State of emotional shock and stress, unspecified: Secondary | ICD-10-CM | POA: Diagnosis not present

## 2014-12-31 ENCOUNTER — Encounter: Payer: Self-pay | Admitting: Nurse Practitioner

## 2014-12-31 ENCOUNTER — Ambulatory Visit (INDEPENDENT_AMBULATORY_CARE_PROVIDER_SITE_OTHER): Payer: Medicare Other | Admitting: Nurse Practitioner

## 2014-12-31 VITALS — BP 102/60 | HR 100 | Temp 98.2°F | Ht 66.0 in | Wt 188.0 lb

## 2014-12-31 DIAGNOSIS — J069 Acute upper respiratory infection, unspecified: Secondary | ICD-10-CM | POA: Insufficient documentation

## 2014-12-31 DIAGNOSIS — B9789 Other viral agents as the cause of diseases classified elsewhere: Principal | ICD-10-CM

## 2014-12-31 HISTORY — DX: Acute upper respiratory infection, unspecified: J06.9

## 2014-12-31 MED ORDER — FLUTICASONE PROPIONATE 50 MCG/ACT NA SUSP: 1.0000 | Freq: Two times a day (BID) | NASAL | Status: DC

## 2014-12-31 MED ORDER — HYDROCODONE-HOMATROPINE 5-1.5 MG/5ML PO SYRP: 5.0000 mL | ORAL_SOLUTION | Freq: Every evening | ORAL | Status: DC | PRN

## 2014-12-31 NOTE — Patient Instructions (Signed)
You have a cold virus causing your symptoms. The average duration of cold symptoms is 14 days. Start daily sinus rinses (neilmed Sinus Rinse). 200 to 400 mg ibuprophen twice daily for headache. Vicks vapor rub under nose to help breathe. Cough syrup for night time. Sip fluids every hour. Rest. If you are not feeling better in 10 days or develop fever or chest pain, call us for re-evaluation. Feel better!  Upper Respiratory Infection, Adult An upper respiratory infection (URI) is also sometimes known as the common cold. The upper respiratory tract includes the nose, sinuses, throat, trachea, and bronchi. Bronchi are the airways leading to the lungs. Most people improve within 1 week, but symptoms can last up to 2 weeks. A residual cough may last even longer.  CAUSES Many different viruses can infect the tissues lining the upper respiratory tract. The tissues become irritated and inflamed and often become very moist. Mucus production is also common. A cold is contagious. You can easily spread the virus to others by oral contact. This includes kissing, sharing a glass, coughing, or sneezing. Touching your mouth or nose and then touching a surface, which is then touched by another person, can also spread the virus. SYMPTOMS  Symptoms typically develop 1 to 3 days after you come in contact with a cold virus. Symptoms vary from person to person. They may include:  Runny nose.  Sneezing.  Nasal congestion.  Sinus irritation.  Sore throat.  Loss of voice (laryngitis).  Cough.  Fatigue.  Muscle aches.  Loss of appetite.  Headache.  Low-grade fever. DIAGNOSIS  You might diagnose your own cold based on familiar symptoms, since most people get a cold 2 to 3 times a year. Your caregiver can confirm this based on your exam. Most importantly, your caregiver can check that your symptoms are not due to another disease such as strep throat, sinusitis, pneumonia, asthma, or epiglottitis. Blood tests,  throat tests, and X-rays are not necessary to diagnose a common cold, but they may sometimes be helpful in excluding other more serious diseases. Your caregiver will decide if any further tests are required. RISKS AND COMPLICATIONS  You may be at risk for a more severe case of the common cold if you smoke cigarettes, have chronic heart disease (such as heart failure) or lung disease (such as asthma), or if you have a weakened immune system. The very young and very old are also at risk for more serious infections. Bacterial sinusitis, middle ear infections, and bacterial pneumonia can complicate the common cold. The common cold can worsen asthma and chronic obstructive pulmonary disease (COPD). Sometimes, these complications can require emergency medical care and may be life-threatening. PREVENTION  The best way to protect against getting a cold is to practice good hygiene. Avoid oral or hand contact with people with cold symptoms. Wash your hands often if contact occurs. There is no clear evidence that vitamin C, vitamin E, echinacea, or exercise reduces the chance of developing a cold. However, it is always recommended to get plenty of rest and practice good nutrition. TREATMENT  Treatment is directed at relieving symptoms. There is no cure. Antibiotics are not effective, because the infection is caused by a virus, not by bacteria. Treatment may include:  Increased fluid intake. Sports drinks offer valuable electrolytes, sugars, and fluids.  Breathing heated mist or steam (vaporizer or shower).  Eating chicken soup or other clear broths, and maintaining good nutrition.  Getting plenty of rest.  Using gargles or lozenges for comfort.  Controlling fevers with ibuprofen or acetaminophen as directed by your caregiver.  Increasing usage of your inhaler if you have asthma. Zinc gel and zinc lozenges, taken in the first 24 hours of the common cold, can shorten the duration and lessen the severity of  symptoms. Pain medicines may help with fever, muscle aches, and throat pain. A variety of non-prescription medicines are available to treat congestion and runny nose. Your caregiver can make recommendations and may suggest nasal or lung inhalers for other symptoms.  HOME CARE INSTRUCTIONS   Only take over-the-counter or prescription medicines for pain, discomfort, or fever as directed by your caregiver.  Use a warm mist humidifier or inhale steam from a shower to increase air moisture. This may keep secretions moist and make it easier to breathe.  Drink enough water and fluids to keep your urine clear or pale yellow.  Rest as needed.  Return to work when your temperature has returned to normal or as your caregiver advises. You may need to stay home longer to avoid infecting others. You can also use a face mask and careful hand washing to prevent spread of the virus. SEEK MEDICAL CARE IF:   After the first few days, you feel you are getting worse rather than better.  You need your caregiver's advice about medicines to control symptoms.  You develop chills, worsening shortness of breath, or brown or red sputum. These may be signs of pneumonia.  You develop yellow or brown nasal discharge or pain in the face, especially when you bend forward. These may be signs of sinusitis.  You develop a fever, swollen neck glands, pain with swallowing, or white areas in the back of your throat. These may be signs of strep throat. SEEK IMMEDIATE MEDICAL CARE IF:   You have a fever.  You develop severe or persistent headache, ear pain, sinus pain, or chest pain.  You develop wheezing, a prolonged cough, cough up blood, or have a change in your usual mucus (if you have chronic lung disease).  You develop sore muscles or a stiff neck. Document Released: 05/26/2001 Document Revised: 02/22/2012 Document Reviewed: 04/03/2011 [facility] Patient Information 2014 [place], [place].

## 2014-12-31 NOTE — Progress Notes (Signed)
Pre visit review using our clinic review tool, if applicable. No additional management support is needed unless otherwise documented below in the visit note. 

## 2015-01-01 NOTE — Progress Notes (Signed)
   Subjective:    Patient ID: Christine [name], female    DOB: Nov 07, 1951, 64 y.o.   MRN: [id number]  Sinusitis This is a new problem. The current episode started in the past 7 days (5d). The problem has been gradually worsening since onset. There has been no fever. The pain is moderate. Associated symptoms include congestion, coughing, headaches, a hoarse voice and sinus pressure. Pertinent negatives include no chills, ear pain, shortness of breath, sneezing, sore throat or swollen glands. Past treatments include oral decongestants (coricidin, delsym, prescription cough syrup). The treatment provided mild relief.      Review of Systems  Constitutional: Positive for fatigue. Negative for fever and chills.  HENT: Positive for congestion, hoarse voice, postnasal drip, sinus pressure and voice change (hoarse). Negative for ear pain, sneezing and sore throat.   Respiratory: Positive for cough. Negative for chest tightness, shortness of breath and wheezing.   Cardiovascular: Negative for chest pain.  Musculoskeletal: Negative for back pain.  Neurological: Positive for headaches.       Objective:   Physical Exam  Constitutional: She is oriented to person, place, and time. She appears well-developed and well-nourished. No distress.  HENT:  Head: Normocephalic and atraumatic.  Right Ear: External ear normal.  Left Ear: External ear normal.  Mouth/Throat: Oropharynx is clear and moist. No oropharyngeal exudate.  Nasal quality to voice, hoarse   Eyes: Conjunctivae are normal. Right eye exhibits no discharge. Left eye exhibits no discharge.  Neck: Normal range of motion. Neck supple. No thyromegaly present.  Cardiovascular: Normal rate, regular rhythm and normal heart sounds.   No murmur heard. Pulmonary/Chest: Effort normal and breath sounds normal. No respiratory distress. She has no wheezes. She has no rales.  Lymphadenopathy:    She has no cervical adenopathy.  Neurological: She is  alert and oriented to person, place, and time.  Skin: Skin is dry.  Psychiatric: She has a normal mood and affect. Her behavior is normal. Thought content normal.  Vitals reviewed.         Assessment & Plan:  1. Viral upper respiratory tract infection with cough - HYDROcodone-homatropine (HYCODAN) 5-1.5 MG/5ML syrup; Take 5 mLs by mouth at bedtime as needed for cough.  Dispense: 120 mL; Refill: 0 - fluticasone (FLONASE) 50 MCG/ACT nasal spray; Place 1 spray into both nostrils 2 (two) times daily.  Dispense: 16 g; Refill: 6  Symptom management See pt instructions F/u prn

## 2015-01-08 DIAGNOSIS — Z1231 Encounter for screening mammogram for malignant neoplasm of breast: Secondary | ICD-10-CM | POA: Diagnosis not present

## 2015-01-08 DIAGNOSIS — M899 Disorder of bone, unspecified: Secondary | ICD-10-CM | POA: Diagnosis not present

## 2015-01-11 ENCOUNTER — Encounter: Payer: Self-pay | Admitting: Family Medicine

## 2015-01-11 ENCOUNTER — Ambulatory Visit (INDEPENDENT_AMBULATORY_CARE_PROVIDER_SITE_OTHER): Payer: Medicare Other | Admitting: Family Medicine

## 2015-01-11 DIAGNOSIS — M542 Cervicalgia: Secondary | ICD-10-CM

## 2015-01-11 DIAGNOSIS — M9908 Segmental and somatic dysfunction of rib cage: Secondary | ICD-10-CM | POA: Diagnosis not present

## 2015-01-11 DIAGNOSIS — M9901 Segmental and somatic dysfunction of cervical region: Secondary | ICD-10-CM

## 2015-01-11 DIAGNOSIS — J069 Acute upper respiratory infection, unspecified: Secondary | ICD-10-CM

## 2015-01-11 DIAGNOSIS — B9789 Other viral agents as the cause of diseases classified elsewhere: Principal | ICD-10-CM

## 2015-01-11 DIAGNOSIS — M999 Biomechanical lesion, unspecified: Secondary | ICD-10-CM

## 2015-01-11 DIAGNOSIS — M9902 Segmental and somatic dysfunction of thoracic region: Secondary | ICD-10-CM

## 2015-01-11 MED ORDER — AMOXICILLIN-POT CLAVULANATE 875-125 MG PO TABS: 1.0000 | ORAL_TABLET | Freq: Two times a day (BID) | ORAL | Status: DC

## 2015-01-11 MED ORDER — BENZONATATE 200 MG PO CAPS: 200.0000 mg | ORAL_CAPSULE | Freq: Two times a day (BID) | ORAL | Status: DC | PRN

## 2015-01-11 MED ORDER — HYDROCODONE-HOMATROPINE 5-1.5 MG/5ML PO SYRP: 5.0000 mL | ORAL_SOLUTION | Freq: Every evening | ORAL | Status: DC | PRN

## 2015-01-11 NOTE — Patient Instructions (Signed)
Good to see you Zyrtec nightly for 1 week can dry you up Refilled cough syrup If not better in 24 hours start the augmentin 2 times daily for 10 days See me again in 3-4 weeks.

## 2015-01-11 NOTE — Progress Notes (Signed)
  [name] [address] [place], [place] [address] Phone: [phone] Subjective:    CC: Neck and upper back pain follow-up  [id number] Christine [name] is a 64 y.o. female coming in for follow-up of her neck pain. Patient states that her neck pain is feeling significantly better. Patient though is continuing to have actually rib pain after having a cough. Patient was seen recently and had more of an upper respiratory type infection. Patient was given cost 0 which was helpful. Patient is out of the [place] up continues to have coughing. Patient states that she did have 2 days which she seemed to improve and now she is feeling worse again. Patient feels somewhat fevers but not all the way. Patient states though that this is cause some of her thoracic and low back to hurt more than her neck. Patient has not been very active secondary to the pain.   Past medical history, social, surgical and family history all reviewed in electronic medical record.   Review of Systems: No headache, visual changes, nausea, vomiting, diarrhea, constipation, dizziness, abdominal pain, skin rash, fevers, chills, night sweats, weight loss, swollen lymph nodes, body aches, joint swelling, muscle aches, chest pain, shortness of breath, mood changes.   Objective Vitals reviewed   General: No apparent distress alert and oriented x3 mood and affect normal, dressed appropriately.  HEENT: Pupils equal, extraocular movements intact  Respiratory: Patient's speak in full sentences and does not appear short of breath no focal findings Cardiovascular: No lower extremity edema, non tender, no erythema  Skin:  No rash noted in the skin today. But not to the exam. No masses palpated. Neurovascularly intact. Abdomen: Soft nontender  No organomegaly Neuro: Cranial nerves II through XII are intact, neurovascularly intact in all extremities with 2+ DTRs and 2+ pulses.  Lymph: No lymphadenopathy of  posterior or anterior cervical chain or axillae bilaterally.  Gait normal with good balance and coordination.  MSK:  Non tender with full range of motion and good stability and symmetric strength and tone of  elbows, wrist, hip, knee and ankles bilaterally.    OMT Physical Exam  Cervical  C2 flexed rotated and side bent right C7 flexed rotated and side bent left  Thoracic T3 extended rotated and side bent left inhaled third rib T7 extended rotated and side bent right  Lumbar L2 flexed rotated inside that right with tight hip flexors bilaterally  Sacrum Left on left    Impression and Recommendations:     This case required medical decision making of moderate complexity.

## 2015-01-11 NOTE — Assessment & Plan Note (Signed)
She was doing significant better at this time. Discussed icing and home exercises. We discussed with patient continues to do well we will have patient follow-up more on an as-needed basis for this problem. I do believe though because patient has been coughing she'll likely be back in 3-4 weeks because she'll likely have more of the thoracic back pain.

## 2015-01-11 NOTE — Assessment & Plan Note (Signed)

## 2015-01-11 NOTE — Assessment & Plan Note (Signed)
Patient was given a refill of the cough syrup. Because patient did have 2 days of seeming to be symptom-free and then worsening there is always a possibility for patient to have a secondary bacterial infection. Because this is going into a weekend patient was given up her prescription in for an antibiotic to have just in case.  Do not feel that further imaging is necessary and no focal findings on exam today. Patient though will call if any worsening symptoms occurs.

## 2015-01-11 NOTE — Progress Notes (Signed)
Pre visit review using our clinic review tool, if applicable. No additional management support is needed unless otherwise documented below in the visit note. 

## 2015-01-21 ENCOUNTER — Encounter: Payer: Self-pay | Admitting: Internal Medicine

## 2015-01-21 NOTE — Progress Notes (Signed)
Bone density report has been mailed to the patient.

## 2015-01-24 ENCOUNTER — Encounter: Payer: Self-pay | Admitting: Endocrinology

## 2015-01-24 ENCOUNTER — Other Ambulatory Visit: Payer: Self-pay | Admitting: Endocrinology

## 2015-01-24 DIAGNOSIS — E1165 Type 2 diabetes mellitus with hyperglycemia: Secondary | ICD-10-CM

## 2015-01-24 DIAGNOSIS — IMO0002 Reserved for concepts with insufficient information to code with codable children: Secondary | ICD-10-CM

## 2015-01-24 MED ORDER — PIOGLITAZONE HCL 30 MG PO TABS: 30.0000 mg | ORAL_TABLET | Freq: Every day | ORAL | Status: DC

## 2015-02-01 ENCOUNTER — Encounter: Payer: Self-pay | Admitting: Family Medicine

## 2015-02-01 ENCOUNTER — Ambulatory Visit (INDEPENDENT_AMBULATORY_CARE_PROVIDER_SITE_OTHER): Payer: Medicare Other | Admitting: Family Medicine

## 2015-02-01 VITALS — BP 122/78 | HR 97 | Ht 66.0 in | Wt 188.0 lb

## 2015-02-01 DIAGNOSIS — M9908 Segmental and somatic dysfunction of rib cage: Secondary | ICD-10-CM | POA: Diagnosis not present

## 2015-02-01 DIAGNOSIS — M542 Cervicalgia: Secondary | ICD-10-CM | POA: Diagnosis not present

## 2015-02-01 DIAGNOSIS — M9902 Segmental and somatic dysfunction of thoracic region: Secondary | ICD-10-CM

## 2015-02-01 DIAGNOSIS — M9901 Segmental and somatic dysfunction of cervical region: Secondary | ICD-10-CM

## 2015-02-01 DIAGNOSIS — M999 Biomechanical lesion, unspecified: Secondary | ICD-10-CM

## 2015-02-01 NOTE — Patient Instructions (Signed)
Good to see you We can consider orthotics at a later date, say after labor day.  Ice is still your friends Continue the exercises and focus on posture See me again in 3 weeks.

## 2015-02-01 NOTE — Progress Notes (Signed)
Pre visit review using our clinic review tool, if applicable. No additional management support is needed unless otherwise documented below in the visit note. 

## 2015-02-01 NOTE — Assessment & Plan Note (Signed)
Patient continues to do well with conservative therapy. We discussed icing regimen and home exercises again. We discussed with patient going on a long road trip patient may when I see me after that trip because patient may have some increasing pain due to stain in one position for a long amount of time. We discussed getting around doing stretches. We discussed proper shoewear and the possibility of custom orthotics at some point. Patient will come back and see me when she comes back into town.

## 2015-02-01 NOTE — Progress Notes (Signed)
  [name] [address] [place], [place] [address] Phone: [phone] Subjective:    CC: Neck and upper back pain follow-up  [id number] Christine [name] is a 64 y.o. female coming in for follow-up of her neck pain. Patient has been seen me for osteopathic manipulation. Patient continues to do fairly well. Patient is going out of town here in the next several weeks. Patient states that that usually gives her some mild discomfort. Patient is having no radiation of pain. Patient did have another upper respiratory infection and did have coughing would seem to make some things a little more aggravated. Patient has not needed any pain medications in quite some time. Patient is overall feeling relatively well.   Past medical history, social, surgical and family history all reviewed in electronic medical record.   Review of Systems: No headache, visual changes, nausea, vomiting, diarrhea, constipation, dizziness, abdominal pain, skin rash, fevers, chills, night sweats, weight loss, swollen lymph nodes, body aches, joint swelling, muscle aches, chest pain, shortness of breath, mood changes.   Objective Blood pressure 122/78, pulse 97, height 5\' 6"  (1.676 m), weight 188 lb (85.276 kg), SpO2 96 %.  General: No apparent distress alert and oriented x3 mood and affect normal, dressed appropriately.  HEENT: Pupils equal, extraocular movements intact  Respiratory: Patient's speak in full sentences and does not appear short of breath  Cardiovascular: No lower extremity edema, non tender, no erythema  Skin:  No rash noted in the skin today. But not to the exam. No masses palpated. Neurovascularly intact. Abdomen: Soft nontender  No organomegaly Neuro: Cranial nerves II through XII are intact, neurovascularly intact in all extremities with 2+ DTRs and 2+ pulses.  Lymph: No lymphadenopathy of posterior or anterior cervical chain or axillae bilaterally.  Gait normal with good  balance and coordination.  MSK:  Non tender with full range of motion and good stability and symmetric strength and tone of  elbows, wrist, hip, knee and ankles bilaterally.  Neck: Inspection unremarkable. No palpable stepoffs. Negative Spurling's maneuver. Full neck range of motion Grip strength and sensation normal in bilateral hands Strength good C4 to T1 distribution No sensory change to C4 to T1 Negative Hoffman sign bilaterally Reflexes normal   OMT Physical Exam  Cervical  C2 flexed rotated and side bent right C7 flexed rotated and side bent left  Thoracic T3 extended rotated and side bent left inhaled third rib T7 extended rotated and side bent right  Lumbar L2 flexed rotated inside that right with tight hip flexors bilaterally  Sacrum Left on left Same pattern as previously    Impression and Recommendations:     This case required medical decision making of moderate complexity.

## 2015-02-01 NOTE — Assessment & Plan Note (Signed)
Decision today to treat with OMT was based on Physical Exam  After verbal consent patient was treated with HVLA, ME, FPR techniques in cervical, thoracic, lumbar  areas  Patient tolerated the procedure well with improvement in symptoms  Patient given exercises, stretches and lifestyle modifications  See medications in patient instructions if given  Patient will follow up in 4 weeks 

## 2015-02-11 ENCOUNTER — Other Ambulatory Visit: Payer: Self-pay | Admitting: Endocrinology

## 2015-02-11 ENCOUNTER — Telehealth: Payer: Self-pay | Admitting: Endocrinology

## 2015-02-11 NOTE — Telephone Encounter (Signed)
Pt needs new rx for the test strips called in with accurate ICD 10 codes. Call [facility] to [facility] on [address]

## 2015-02-12 ENCOUNTER — Other Ambulatory Visit: Payer: Self-pay

## 2015-02-12 MED ORDER — GLUCOSE BLOOD VI STRP: ORAL_STRIP | Status: DC

## 2015-02-22 ENCOUNTER — Ambulatory Visit (INDEPENDENT_AMBULATORY_CARE_PROVIDER_SITE_OTHER): Payer: Medicare Other | Admitting: Endocrinology

## 2015-02-22 ENCOUNTER — Encounter: Payer: Self-pay | Admitting: Endocrinology

## 2015-02-22 VITALS — BP 110/80 | HR 88 | Temp 98.7°F | Wt 188.0 lb

## 2015-02-22 DIAGNOSIS — IMO0002 Reserved for concepts with insufficient information to code with codable children: Secondary | ICD-10-CM

## 2015-02-22 DIAGNOSIS — E1165 Type 2 diabetes mellitus with hyperglycemia: Secondary | ICD-10-CM

## 2015-02-22 LAB — HEMOGLOBIN A1C: HEMOGLOBIN A1C: 8.3 % — AB (ref 4.6–6.5)

## 2015-02-22 NOTE — Progress Notes (Signed)
Pre visit review using our clinic review tool, if applicable. No additional management support is needed unless otherwise documented below in the visit note. 

## 2015-02-22 NOTE — Progress Notes (Signed)
Subjective:    Patient ID: Christine [name], female    DOB: [birth year], 64 y.o.   MRN: [id number]  HPI Pt returns for f/u of diabetes mellitus: DM type: Insulin-requiring type 2 Dx'ed: 6789 Complications: TIA Therapy: insulin GDM: never DKA: never Severe hypoglycemia: never Pancreatitis: never Other: She wants to take insulin just QD.  pioglitizone is for NASH. Interval history: She was last on steroids in dec, 2015.  she brings a record of her cbg's which i have reviewed today.  She checks in am only.  It varies from 120-160.   Past Medical History  Diagnosis Date  . Allergic rhinitis   . Sleep disorder     [name] May  . Diabetes mellitus   . Migraine headache     quiescent  . TIA (transient ischemic attack)   . Hyperlipidemia   . HTN (hypertension)   . History of recurrent UTIs     [name]    Past Surgical History  Procedure Laterality Date  . Polypectomy  2002    benign, hyperplastic polyp; rectal bleeding 2008  . Total abdominal hysterectomy  1983    BSO for endometriosis  . Coronary angioplasty  1997    for chest pain- negative   . Tonsillectomy    . Wisdom tooth extraction    . Colonoscopy      Dr Carlean [name]; hemorrhoids    History   Social History  . Marital Status: Married    Spouse Name: N/A  . Number of Children: N/A  . Years of Education: N/A   Occupational History  . Data Compensation Specialist     Social History Main Topics  . Smoking status: Never Smoker   . Smokeless tobacco: Not on file  . Alcohol Use: No  . Drug Use: No  . Sexual Activity: Not on file   Other Topics Concern  . Not on file   Social History Narrative   Regular exercise- no     Current Outpatient Prescriptions on File Prior to Visit  Medication Sig Dispense Refill  . aspirin 81 MG tablet Take 81 mg by mouth daily.      [name] atorvastatin (LIPITOR) 20 MG tablet Take 1 tablet (20 mg total) by mouth daily. 90 tablet 3  . busPIRone (BUSPAR) 15 MG tablet Take 15 mg  by mouth daily.     . Cholecalciferol (VITAMIN D3) 5000 UNITS CAPS Take 5,000 Units by mouth daily.     . cyclobenzaprine (FLEXERIL) 10 MG tablet Take 1 tablet (10 mg total) by mouth at bedtime as needed. For muscle spasm. 30 tablet 0  . desvenlafaxine (PRISTIQ) 100 MG 24 hr tablet Take 100 mg by mouth daily.    . fish oil-omega-3 fatty acids 1000 MG capsule Take 1 g by mouth daily.     . fluticasone (FLONASE) 50 MCG/ACT nasal spray Place 1 spray into both nostrils 2 (two) times daily. 16 g 6  . glucose blood (ONE TOUCH ULTRA TEST) test strip USE 1 STRIP TWICE DAILY Dx Code E11.65 100 each 2  . Insulin Glargine (LANTUS) 100 UNIT/ML Solostar Pen Inject 45 Units into the skin every morning. And pen needles 1/day 250.01    . ketoconazole (NIZORAL) 2 % cream Apply bid prn 15 g 0  . Lancets (ONETOUCH ULTRASOFT) lancets Check blood sugar once daily. Dx code: 250.00 100 each 12  . LORazepam (ATIVAN) 0.5 MG tablet Take 0.5 mg by mouth 2 (two) times daily as needed. For anxiety.    [name]  losartan (COZAAR) 100 MG tablet TAKE 1 TABLET EVERY DAY 90 tablet 1  . nystatin-triamcinolone ointment (MYCOLOG) Apply 1 application topically 2 (two) times daily. 30 g 0  . omeprazole (PRILOSEC) 20 MG capsule Take 1 capsule (20 mg total) by mouth daily. 1 pre breakfast as needed 90 capsule 1  . pioglitazone (ACTOS) 30 MG tablet Take 1 tablet (30 mg total) by mouth daily. 30 tablet 5  . traMADol (ULTRAM) 50 MG tablet Take 1 tablet (50 mg total) by mouth every 12 (twelve) hours as needed for severe pain. 60 tablet 0  . vitamin B-12 (CYANOCOBALAMIN) 250 MCG tablet Take 250 mcg by mouth daily.       No current facility-administered medications on file prior to visit.    Allergies  Allergen Reactions  . Vioxx [Rofecoxib]     Excess BP which caused TIA  . Prednisone     REACTION:  Mental status changes No associated rash or fever  . Colesevelam     REACTION: Leg Pain    Family History  Problem Relation Age of Onset    . Colon cancer Maternal Aunt   . Diabetes Paternal Uncle   . Heart attack Father 15  . COPD Mother   . Lung cancer Brother     smoker  . Mental illness      niece, committed suicide.  [name] Heart attack      brother X 2; @ 31 & 76  . Stroke Neg Hx     BP 110/80 mmHg  Pulse 88  Temp(Src) 98.7 F (37.1 C) (Oral)  Wt 188 lb (85.276 kg)  Review of Systems She denies hypoglycemia.  Depression is improved, but pt says this continues to compromise the care of her DM.      Objective:   Physical Exam VITAL SIGNS:  See vs page GENERAL: no distress Pulses: dorsalis pedis intact bilat.   MSK: no deformity of the feet CV: no leg edema Skin:  no ulcer on the feet.  normal color and temp on the feet. Neuro: sensation is intact to touch on the feet.    Lab Results  Component Value Date   HGBA1C 8.3* 02/22/2015       Assessment & Plan:  DM: glycemic control is worse Depression: stable.  In this context, I think she needs to continue QD insulin.  Same pristiq.  Patient is advised the following: Patient Instructions  check your blood sugar twice a day.  vary the time of day when you check, between before the 3 meals, and at bedtime.  also check if you have symptoms of your blood sugar being too high or too low.  please keep a record of the readings and bring it to your next appointment here.  You can write it on any piece of paper.  please call us sooner if your blood sugar goes below 70, or if you have a lot of readings over 200.   blood tests are being requested for you today.  We'll let you know about the results.   Based on the results, we may need to increase the lantus.  Please come back for a follow-up appointment in 3 months.      addendum: increase the lantus to 45 units daily.

## 2015-02-22 NOTE — Patient Instructions (Addendum)
check your blood sugar twice a day.  vary the time of day when you check, between before the 3 meals, and at bedtime.  also check if you have symptoms of your blood sugar being too high or too low.  please keep a record of the readings and bring it to your next appointment here.  You can write it on any piece of paper.  please call us sooner if your blood sugar goes below 70, or if you have a lot of readings over 200.   blood tests are being requested for you today.  We'll let you know about the results.   Based on the results, we may need to increase the lantus.  Please come back for a follow-up appointment in 3 months.

## 2015-02-26 ENCOUNTER — Other Ambulatory Visit: Payer: Self-pay

## 2015-02-26 DIAGNOSIS — M797 Fibromyalgia: Secondary | ICD-10-CM

## 2015-02-26 MED ORDER — CYCLOBENZAPRINE HCL 10 MG PO TABS: ORAL_TABLET | ORAL | Status: DC

## 2015-02-26 NOTE — Telephone Encounter (Signed)
1/2-1 qhs prn #10 OVINB This medication is among those which experts have documented to have a very  high risk of affecting  mental  alertness  & balance. This results in increased risk of falling with serious health or life threatening injury. Such medication should be taken as infrequently as possible and @  the lowest possible dose.It should not be taken with alcohol, sedatives  or other agents which have a similar  adverse risk potential. These risks are greater as we age as there is decreased ability of the liver and kidneys to metabolize and excrete the medication, resulting in   increased blood levels of the active ingredient.

## 2015-02-26 NOTE — Telephone Encounter (Signed)
Pt last seen 11/21/14 Looks like medication was last routed to pharmacy on 12/12/14 #30 no refills

## 2015-02-28 ENCOUNTER — Encounter: Payer: Self-pay | Admitting: Internal Medicine

## 2015-02-28 DIAGNOSIS — R457 State of emotional shock and stress, unspecified: Secondary | ICD-10-CM | POA: Diagnosis not present

## 2015-03-04 ENCOUNTER — Ambulatory Visit (INDEPENDENT_AMBULATORY_CARE_PROVIDER_SITE_OTHER): Payer: Medicare Other | Admitting: Family Medicine

## 2015-03-04 ENCOUNTER — Encounter: Payer: Self-pay | Admitting: Family Medicine

## 2015-03-04 VITALS — BP 120/78 | HR 100 | Ht 66.0 in

## 2015-03-04 DIAGNOSIS — M542 Cervicalgia: Secondary | ICD-10-CM

## 2015-03-04 DIAGNOSIS — M9901 Segmental and somatic dysfunction of cervical region: Secondary | ICD-10-CM | POA: Diagnosis not present

## 2015-03-04 DIAGNOSIS — M9902 Segmental and somatic dysfunction of thoracic region: Secondary | ICD-10-CM | POA: Diagnosis not present

## 2015-03-04 DIAGNOSIS — M9908 Segmental and somatic dysfunction of rib cage: Secondary | ICD-10-CM | POA: Diagnosis not present

## 2015-03-04 DIAGNOSIS — M999 Biomechanical lesion, unspecified: Secondary | ICD-10-CM

## 2015-03-04 MED ORDER — TRAMADOL HCL 50 MG PO TABS: 50.0000 mg | ORAL_TABLET | Freq: Two times a day (BID) | ORAL | Status: DC | PRN

## 2015-03-04 NOTE — Progress Notes (Signed)
Pre visit review using our clinic review tool, if applicable. No additional management support is needed unless otherwise documented below in the visit note. 

## 2015-03-04 NOTE — Assessment & Plan Note (Signed)
Decision today to treat with OMT was based on Physical Exam  After verbal consent patient was treated with HVLA, ME, FPR techniques in cervical, thoracic, lumbar areas  Patient tolerated the procedure well with improvement in symptoms  Patient given exercises, stretches and lifestyle modifications  See medications in patient instructions if given  Patient will follow up in 3-4 weeks

## 2015-03-04 NOTE — Progress Notes (Signed)
  [name], [place] [address] Phone: [phone] Subjective:    CC: Neck and upper back pain follow-up  [id number] Bera JOEY HUDOCK is a 64 y.o. female coming in for follow-up of her neck pain. Patient has been seen me for osteopathic manipulation. Patient is leaving next week and is going to be doing a road trip. Patient has noticed always that this does seem to make her neck flare more. We do think that her neck is also associated with her fibromyalgia. Patient does intermittently take tramadol and is running low. Patient also helped out with a large barbecue and this has seemed to aggravate patient shoulder and neck pain somewhat more.   Past medical history, social, surgical and family history all reviewed in electronic medical record.   Review of Systems: No headache, visual changes, nausea, vomiting, diarrhea, constipation, dizziness, abdominal pain, skin rash, fevers, chills, night sweats, weight loss, swollen lymph nodes, body aches, joint swelling, muscle aches, chest pain, shortness of breath, mood changes.   Objective Blood pressure 120/78, pulse 100, height 5\' 6"  (1.676 m), SpO2 95 %.  General: No apparent distress alert and oriented x3 mood and affect normal, dressed appropriately.  HEENT: Pupils equal, extraocular movements intact  Respiratory: Patient's speak in full sentences and does not appear short of breath  Cardiovascular: No lower extremity edema, non tender, no erythema  Skin:  No rash noted in the skin today. But not to the exam. No masses palpated. Neurovascularly intact. Abdomen: Soft nontender  No organomegaly Neuro: Cranial nerves II through XII are intact, neurovascularly intact in all extremities with 2+ DTRs and 2+ pulses.  Lymph: No lymphadenopathy of posterior or anterior cervical chain or axillae bilaterally.  Gait normal with good balance and coordination.  MSK:  Non tender with full range of  motion and good stability and symmetric strength and tone of  elbows, wrist, hip, knee and ankles bilaterally.  Neck: Inspection unremarkable. No palpable stepoffs. Negative Spurling's maneuver. Full neck range of motion Grip strength and sensation normal in bilateral hands Strength good C4 to T1 distribution No sensory change to C4 to T1 Negative Hoffman sign bilaterally Reflexes normal   OMT Physical Exam  Cervical  C2 flexed rotated and side bent right C7 flexed rotated and side bent left  Thoracic T3 extended rotated and side bent left inhaled third rib T7 extended rotated and side bent right  Lumbar L2 flexed rotated inside that right with tight hip flexors bilaterally  Sacrum Left on left more tender than previous exam     Impression and Recommendations:     This case required medical decision making of moderate complexity.

## 2015-03-04 NOTE — Assessment & Plan Note (Signed)
Encouraged patient to continue the postural exercises and we discussed proper seated position in the car. We discussed other ergonomic changes she can do that hopefully we'll be more beneficial. We discussed continuing the natural vitamins supplementations. Patient is going to come back and see me when she returns she'll be the second week in April.

## 2015-03-04 NOTE — Patient Instructions (Signed)
Good to see you Ice still can help When sitting in the car keep with the posture.  You are oding great See me when you return.

## 2015-03-22 ENCOUNTER — Encounter: Payer: Self-pay | Admitting: Internal Medicine

## 2015-03-22 ENCOUNTER — Ambulatory Visit (INDEPENDENT_AMBULATORY_CARE_PROVIDER_SITE_OTHER): Payer: Medicare Other | Admitting: Internal Medicine

## 2015-03-22 VITALS — BP 124/86 | HR 106 | Temp 98.5°F | Wt 192.5 lb

## 2015-03-22 DIAGNOSIS — R0789 Other chest pain: Secondary | ICD-10-CM

## 2015-03-22 MED ORDER — HYDROCODONE-ACETAMINOPHEN 10-325 MG PO TABS: 1.0000 | ORAL_TABLET | Freq: Three times a day (TID) | ORAL | Status: DC | PRN

## 2015-03-22 NOTE — Progress Notes (Signed)
   Subjective:    Patient ID: Christine [name], female    DOB: [birth year], 64 y.o.   MRN: [id number]  HPI On 03/18/15 she was sitting in a chair and flexed her waist to the right to pickup something on the floor. She heard a pop and felt a "squish".Since that time she's had severe pain in this area , sharp  up to 8 with change in position. It is worse in the right lateral decubitus position or when supine. Coughing or even sniffing can aggravate it. At rest it can be a 4-5.  She did drive back from [place] which involved a 19 hour drive but the pain was present prior to that prolonged drive. They did stop every 2 hours.  She had tramadol which was of minimal benefit.  She's had a similar episode 11/29/2014 which was treated by Dr. Tamala Julian to resolution.Differential was costochondritis vs fibromyalgia flare.   Review of Systems She denies any cough, sputum, or hemoptysis. She's had no fever, chills, sweats.    Objective:   Physical Exam Pertinent or positive findings include: She's exquisitely tender to palpation over the right inferior rib cage. She splints on that side. Breath sounds are consequently decreased.  Homans sign is negative.  General appearance :adequately nourished; in no distress. Eyes: No conjunctival inflammation or scleral icterus is present. Heart:  Normal rate (repeat pulse 90) and regular rhythm. S1 and S2 normal without gallop, murmur, click, rub or other extra sounds  Lungs:Chest clear to auscultation; no wheezes, rhonchi,rales ,or rubs present.No increased work of breathing.  Abdomen: bowel sounds normal, soft and non-tender without masses, organomegaly or hernias noted.  No guarding or rebound.  Vascular : all pulses equal ; no bruits present. Skin:Warm & dry.  Intact without suspicious lesions or rashes ; no tenting or jaundice  Lymphatic: No lymphadenopathy is noted about the head, neck, axilla Neuro: Strength, tone & DTRs normal.        Assessment & Plan:    #1 acute costochondritis of the right inferior thoracic cage  Plan: See orders and recommendations

## 2015-03-22 NOTE — Patient Instructions (Signed)
Use an anti-inflammatory cream such as Aspercreme or Zostrix cream twice a day to the affected area as needed. In lieu of this warm moist compresses or  hot water bottle can be used. Do not apply ice . 

## 2015-03-22 NOTE — Progress Notes (Signed)
Pre visit review using our clinic review tool, if applicable. No additional management support is needed unless otherwise documented below in the visit note. 

## 2015-03-26 ENCOUNTER — Ambulatory Visit: Payer: Medicare Other | Admitting: Family Medicine

## 2015-04-02 ENCOUNTER — Other Ambulatory Visit: Payer: Self-pay

## 2015-04-02 MED ORDER — ATORVASTATIN CALCIUM 20 MG PO TABS: 20.0000 mg | ORAL_TABLET | Freq: Every day | ORAL | Status: DC

## 2015-04-02 MED ORDER — LOSARTAN POTASSIUM 100 MG PO TABS: ORAL_TABLET | ORAL | Status: DC

## 2015-04-29 ENCOUNTER — Encounter: Payer: Self-pay | Admitting: Family Medicine

## 2015-04-29 ENCOUNTER — Ambulatory Visit (INDEPENDENT_AMBULATORY_CARE_PROVIDER_SITE_OTHER): Payer: Medicare Other | Admitting: Family Medicine

## 2015-04-29 VITALS — BP 116/80 | HR 83 | Ht 66.0 in | Wt 190.0 lb

## 2015-04-29 DIAGNOSIS — M9901 Segmental and somatic dysfunction of cervical region: Secondary | ICD-10-CM | POA: Diagnosis not present

## 2015-04-29 DIAGNOSIS — M9908 Segmental and somatic dysfunction of rib cage: Secondary | ICD-10-CM

## 2015-04-29 DIAGNOSIS — M542 Cervicalgia: Secondary | ICD-10-CM | POA: Diagnosis not present

## 2015-04-29 DIAGNOSIS — M999 Biomechanical lesion, unspecified: Secondary | ICD-10-CM

## 2015-04-29 DIAGNOSIS — M9902 Segmental and somatic dysfunction of thoracic region: Secondary | ICD-10-CM

## 2015-04-29 NOTE — Assessment & Plan Note (Signed)
Decision today to treat with OMT was based on Physical Exam  After verbal consent patient was treated with HVLA, ME, FPR techniques in cervical, thoracic, lumbar areas  Patient tolerated the procedure well with improvement in symptoms  Patient given exercises, stretches and lifestyle modifications  See medications in patient instructions if given  Patient will follow up in 3-4 weeks

## 2015-04-29 NOTE — Progress Notes (Signed)
Pre visit review using our clinic review tool, if applicable. No additional management support is needed unless otherwise documented below in the visit note. 

## 2015-04-29 NOTE — Assessment & Plan Note (Signed)
Patient overall is doing better but was having worsening pain. I do think that the interval was too long or too much. We discussed icing regimen and home exercises. Patient given handout of exercises over the greater detail focusing on the upper back musculature. Patient and will come back and see me again in 3 weeks for further evaluation and treatment. After that we may be able to start decreasing frequency again.

## 2015-04-29 NOTE — Progress Notes (Signed)
  [name] [place], [place] [address] Phone: [phone] Subjective:    CC: Neck and upper back pain follow-up  [id number] Christine [name] is a 64 y.o. female coming in for follow-up of her neck pain. Patient has been seen me for osteopathic manipulation. Patient was going on a road trip after last exam.patient is doing much better overall but then unfortunately been in the car for long amount of time causes her to have more stiffness. Patient also played with her grandkids on a more regular basis which did give her more of a tightness of the neck. Patient denies any radiation down the arms or any numbness or tingling but has been very uncomfortable especially for the last week.   Past medical history, social, surgical and family history all reviewed in electronic medical record.   Review of Systems: No headache, visual changes, nausea, vomiting, diarrhea, constipation, dizziness, abdominal pain, skin rash, fevers, chills, night sweats, weight loss, swollen lymph nodes, body aches, joint swelling, muscle aches, chest pain, shortness of breath, mood changes.   Objective Blood pressure 116/80, pulse 83, height 5\' 6"  (1.676 m), weight 190 lb (86.183 kg), SpO2 98 %.  General: No apparent distress alert and oriented x3 mood and affect normal, dressed appropriately.  HEENT: Pupils equal, extraocular movements intact  Respiratory: Patient's speak in full sentences and does not appear short of breath  Cardiovascular: No lower extremity edema, non tender, no erythema  Skin:  No rash noted in the skin today. But not to the exam. No masses palpated. Neurovascularly intact. Abdomen: Soft nontender  No organomegaly Neuro: Cranial nerves II through XII are intact, neurovascularly intact in all extremities with 2+ DTRs and 2+ pulses.  Lymph: No lymphadenopathy of posterior or anterior cervical chain or axillae bilaterally.  Gait normal with good balance  and coordination.  MSK:  Non tender with full range of motion and good stability and symmetric strength and tone of  elbows, wrist, hip, knee and ankles bilaterally.  Neck: Inspection unremarkable. No palpable stepoffs. Negative Spurling's maneuver. Full neck range of motion Grip strength and sensation normal in bilateral hands Strength good C4 to T1 distribution No sensory change to C4 to T1 Negative Hoffman sign bilaterally Reflexes normal Increasing tenderness from previous exam   OMT Physical Exam  Cervical  C2 flexed rotated and side bent right C7 flexed rotated and side bent left  Thoracic T3 extended rotated and side bent left inhaled third rib T7 extended rotated and side bent right  Lumbar L2 flexed rotated inside that right   tight hip flexors bilaterally  Sacrum Left on left more tender than previous exam, continues to seem to worsen     Impression and Recommendations:     This case required medical decision making of moderate complexity.

## 2015-04-29 NOTE — Patient Instructions (Signed)
Good to see you Christine [name] is your friend Exercises 3 times a week.  ON wall heels, butt shoulder and head touching goal of 5 minutes Continue the vitamins See you in 3 weeks.   Standing:  Secure a rubber exercise band/tubing so that it is at the height of your shoulders when you are either standing or sitting on a firm arm-less chair.  Grasp an end of the band/tubing in each hand and have your palms face each other. Straighten your elbows and lift your hands straight in front of you at shoulder height. Step back away from the secured end of band/tubing until it becomes tense.  Squeeze your shoulder blades together. Keeping your elbows locked and your hands at shoulder-height, bring your hands out to your side.  Hold __________ seconds. Slowly ease the tension on the band/tubing as you reverse the directions and return to the starting position. Repeat __________ times. Complete this exercise __________ times per day. STRENGTH - Scapular Retractors  Secure a rubber exercise band/tubing so that it is at the height of your shoulders when you are either standing or sitting on a firm arm-less chair.  With a palm-down grip, grasp an end of the band/tubing in each hand. Straighten your elbows and lift your hands straight in front of you at shoulder height. Step back away from the secured end of band/tubing until it becomes tense.  Squeezing your shoulder blades together, draw your elbows back as you bend them. Keep your upper arm lifted away from your body throughout the exercise.  Hold __________ seconds. Slowly ease the tension on the band/tubing as you reverse the directions and return to the starting position. Repeat __________ times. Complete this exercise __________ times per day. STRENGTH - Shoulder Extensors   Secure a rubber exercise band/tubing so that it is at the height of your shoulders when you are either standing or sitting on a firm arm-less chair.  With a thumbs-up grip, grasp an  end of the band/tubing in each hand. Straighten your elbows and lift your hands straight in front of you at shoulder height. Step back away from the secured end of band/tubing until it becomes tense.  Squeezing your shoulder blades together, pull your hands down to the sides of your thighs. Do not allow your hands to go behind you.  Hold for __________ seconds. Slowly ease the tension on the band/tubing as you reverse the directions and return to the starting position. Repeat __________ times. Complete this exercise __________ times per day.  STRENGTH - Scapular Retractors and External Rotators  Secure a rubber exercise band/tubing so that it is at the height of your shoulders when you are either standing or sitting on a firm arm-less chair.  With a palm-down grip, grasp an end of the band/tubing in each hand. Bend your elbows 90 degrees and lift your elbows to shoulder height at your sides. Step back away from the secured end of band/tubing until it becomes tense.  Squeezing your shoulder blades together, rotate your shoulder so that your upper arm and elbow remain stationary, but your fists travel upward to head-height.  Hold __________ for seconds. Slowly ease the tension on the band/tubing as you reverse the directions and return to the starting position. Repeat __________ times. Complete this exercise __________ times per day.  STRENGTH - Scapular Retractors and External Rotators, Rowing  Secure a rubber exercise band/tubing so that it is at the height of your shoulders when you are either standing or sitting on a  firm arm-less chair.  With a palm-down grip, grasp an end of the band/tubing in each hand. Straighten your elbows and lift your hands straight in front of you at shoulder height. Step back away from the secured end of band/tubing until it becomes tense.  Step 1: Squeeze your shoulder blades together. Bending your elbows, draw your hands to your chest as if you are rowing a boat.  At the end of this motion, your hands and elbow should be at shoulder-height and your elbows should be out to your sides.  Step 2: Rotate your shoulder to raise your hands above your head. Your forearms should be vertical and your upper-arms should be horizontal.  Hold for __________ seconds. Slowly ease the tension on the band/tubing as you reverse the directions and return to the starting position. Repeat __________ times. Complete this exercise __________ times per day.  STRENGTH - Scapular Retractors and Elevators  Secure a rubber exercise band/tubing so that it is at the height of your shoulders when you are either standing or sitting on a firm arm-less chair.  With a thumbs-up grip, grasp an end of the band/tubing in each hand. Step back away from the secured end of band/tubing until it becomes tense.  Squeezing your shoulder blades together, straighten your elbows and lift your hands straight over your head.  Hold for __________ seconds. Slowly ease the tension on the band/tubing as you reverse the directions and return to the starting position. Repeat __________ times. Complete this exercise __________ times per day.  Document Released: 11/30/2005 Document Revised: 02/22/2012 Document Reviewed: 03/14/2009 [facility] Patient Information 2015 [place], [place]. This information is not intended to replace advice given to you by your health care provider. Make sure you discuss any questions you have with your health care provider.

## 2015-05-22 ENCOUNTER — Encounter: Payer: Self-pay | Admitting: Family Medicine

## 2015-05-22 ENCOUNTER — Ambulatory Visit (INDEPENDENT_AMBULATORY_CARE_PROVIDER_SITE_OTHER): Payer: Medicare Other | Admitting: Family Medicine

## 2015-05-22 VITALS — BP 132/84 | HR 72 | Ht 66.0 in | Wt 190.0 lb

## 2015-05-22 DIAGNOSIS — M542 Cervicalgia: Secondary | ICD-10-CM

## 2015-05-22 DIAGNOSIS — M999 Biomechanical lesion, unspecified: Secondary | ICD-10-CM

## 2015-05-22 DIAGNOSIS — M9908 Segmental and somatic dysfunction of rib cage: Secondary | ICD-10-CM | POA: Diagnosis not present

## 2015-05-22 MED ORDER — BACLOFEN 10 MG PO TABS: 10.0000 mg | ORAL_TABLET | Freq: Three times a day (TID) | ORAL | Status: DC

## 2015-05-22 NOTE — Assessment & Plan Note (Addendum)

## 2015-05-22 NOTE — Progress Notes (Signed)
Pre visit review using our clinic review tool, if applicable. No additional management support is needed unless otherwise documented below in the visit note. 

## 2015-05-22 NOTE — Progress Notes (Signed)
  [name] [place], [place] [address] Phone: [phone] Subjective:    CC: Neck and upper back pain follow-up  Christine [name] is a 64 y.o. female coming in for follow-up of her neck pain. Patient has been seen me for osteopathic manipulation. Patient has been doing relatively well but is cleaning her house recently. Patient has been doing a lot more ending in stooping and has noticed significant more neck as well as upper back pain. Patient states it is so severe that it is starting to give her trouble at night. Patient has run out of her. Also relaxer which seemed to be more beneficial. Patient denies any radiation down her arm and states this seems to be localized. Patient is finding it more and more difficult to do daily activities secondary to the pain.   Past medical history, social, surgical and family history all reviewed in electronic medical record.   Review of Systems: No headache, visual changes, nausea, vomiting, diarrhea, constipation, dizziness, abdominal pain, skin rash, fevers, chills, night sweats, weight loss, swollen lymph nodes, body aches, joint swelling, muscle aches, chest pain, shortness of breath, mood changes.   Objective Blood pressure 132/84, pulse 72, height 5\' 6"  (1.676 m), weight 190 lb (86.183 kg), SpO2 94 %.  General: No apparent distress alert and oriented x3 mood and affect normal, dressed appropriately.  HEENT: Pupils equal, extraocular movements intact  Respiratory: Patient's speak in full sentences and does not appear short of breath  Cardiovascular: No lower extremity edema, non tender, no erythema  Skin:  No rash noted in the skin today. But not to the exam. No masses palpated. Neurovascularly intact. Abdomen: Soft nontender  No organomegaly Neuro: Cranial nerves II through XII are intact, neurovascularly intact in all extremities with 2+ DTRs and 2+ pulses.  Lymph: No lymphadenopathy of  posterior or anterior cervical chain or axillae bilaterally.  Gait normal with good balance and coordination.  MSK:  Non tender with full range of motion and good stability and symmetric strength and tone of  elbows, wrist, hip, knee and ankles bilaterally.  Neck: Inspection unremarkable. No palpable stepoffs. Negative Spurling's maneuver. Full neck range of motion Grip strength and sensation normal in bilateral hands Strength good C4 to T1 distribution No sensory change to C4 to T1 Negative Hoffman sign bilaterally Reflexes normal Increasing tenderness from previous exam   OMT Physical Exam  Cervical  C2 flexed rotated and side bent right C7 flexed rotated and side bent left  Thoracic T3 extended rotated and side bent left inhaled third rib Group curve of T5-T7 rotated right and side bent left   Lumbar L2 flexed rotated inside that right   tight hip flexors bilaterally  Sacrum Left on left more tender than previous exam, continues to seem to worsen     Impression and Recommendations:     This case required medical decision making of moderate complexity.

## 2015-05-22 NOTE — Patient Instructions (Addendum)
Good to see you.  Conitnue to work on the scapula and the upper back.   Try heat 20 ice 20 off 20 Duexis 3 times daily for 6 days Baclofen at night Ok to take hydrocodone if needed See me again in2-3 weeks.

## 2015-05-22 NOTE — Assessment & Plan Note (Signed)
Issues neck pain is more multifactorial. Patient did have significant amount of rotation with a new group curve in her upper back and also is likely contributing. We discussed muscle relaxers and patient was given baclofen which may be safer. Patient will take this at night as needed. We discussed icing regimen and home exercises. She will continue the natural supplementations and will come back and see me again in 2-3 weeks for further evaluation and treatment.

## 2015-05-23 ENCOUNTER — Other Ambulatory Visit: Payer: Self-pay | Admitting: *Deleted

## 2015-05-23 ENCOUNTER — Telehealth: Payer: Self-pay | Admitting: Emergency Medicine

## 2015-05-23 ENCOUNTER — Telehealth: Payer: Self-pay | Admitting: Internal Medicine

## 2015-05-23 ENCOUNTER — Encounter: Payer: Self-pay | Admitting: Family Medicine

## 2015-05-23 ENCOUNTER — Ambulatory Visit (INDEPENDENT_AMBULATORY_CARE_PROVIDER_SITE_OTHER): Payer: Medicare Other | Admitting: Family Medicine

## 2015-05-23 ENCOUNTER — Ambulatory Visit (INDEPENDENT_AMBULATORY_CARE_PROVIDER_SITE_OTHER)
Admission: RE | Admit: 2015-05-23 | Discharge: 2015-05-23 | Disposition: A | Discharge status: Home / Self Care | Payer: Medicare Other | Source: Ambulatory Visit | Attending: Family Medicine | Admitting: Family Medicine

## 2015-05-23 ENCOUNTER — Other Ambulatory Visit: Payer: Self-pay | Admitting: Family Medicine

## 2015-05-23 VITALS — BP 130/78 | HR 115 | Ht 66.0 in | Wt 190.0 lb

## 2015-05-23 DIAGNOSIS — R0781 Pleurodynia: Secondary | ICD-10-CM | POA: Diagnosis not present

## 2015-05-23 DIAGNOSIS — M62838 Other muscle spasm: Secondary | ICD-10-CM | POA: Diagnosis not present

## 2015-05-23 DIAGNOSIS — R457 State of emotional shock and stress, unspecified: Secondary | ICD-10-CM | POA: Diagnosis not present

## 2015-05-23 MED ORDER — METHYLPREDNISOLONE ACETATE 80 MG/ML IJ SUSP
80.0000 mg | Freq: Once | INTRAMUSCULAR | Status: AC
  Administered 2015-05-23: 80 mg via INTRAMUSCULAR

## 2015-05-23 MED ORDER — KETOROLAC TROMETHAMINE 60 MG/2ML IM SOLN
60.0000 mg | Freq: Once | INTRAMUSCULAR | Status: AC
  Administered 2015-05-23: 60 mg via INTRAMUSCULAR

## 2015-05-23 NOTE — Patient Instructions (Signed)
I am so sorry you are hurting ICe, Ice ICe 2 shots today Tomorrow duexis, and then norco 1/2 tab and muscle relaxer can all be taken together if needed Husband to clean!!!!!! See me as we have scheduled.

## 2015-05-23 NOTE — Assessment & Plan Note (Addendum)
Patient is having a spasm. I think that this is more of a flare. I do not see any type of bony abnormality noted. The patient's pain worsens patient knows to seek medical attention immediately. Patient will follow up closely in the next 2 weeks. Patient will also send an email to time he has she is doing otherwise. Patient does have pain medications and if any worsening pain I would consider a lidocaine patch. Patient given 2 injections.

## 2015-05-23 NOTE — Telephone Encounter (Signed)
[facility] on [address] has been requesting refill for cyclobenzaprine (FLEXERIL) 10 MG tablet [[id number]. Patient has been asking pharmacy for refills, so they went ahead and sent in the request.

## 2015-05-23 NOTE — Telephone Encounter (Signed)
Flexeril has been discontinued. The pt is now taking baclofen.

## 2015-05-23 NOTE — Telephone Encounter (Signed)
Dr Tamala Julian will Rx muscle relaxers; Baclofen & Flexeril would be duplicative

## 2015-05-23 NOTE — Progress Notes (Signed)
Pre visit review using our clinic review tool, if applicable. No additional management support is needed unless otherwise documented below in the visit note. 

## 2015-05-23 NOTE — Progress Notes (Signed)
  [name] [name], [name] [address] Phone: [phone] Subjective:    CC: Neck and upper back pain follow-up  [id number] Christine [name] NAM is a 64 y.o. female coming in for follow-up of her neck pain. Patient has been seen me for osteopathic manipulation. Patient was seen yesterday and did have any position. Patient partially is having acute pain on the left side. This is a new different type of pain. Patient states that it is fairly severe. Patient was sent to have x-rays. X-rays were reviewed by me. Over read pending. Patient states that she can take the prednisone is having more pain in the back. Patient feels like it is more muscular in nature. States that it is very sharp pain. Patient denies any radiation to the arms.  Past medical history, social, surgical and family history all reviewed in electronic medical record.   Review of Systems: No headache, visual changes, nausea, vomiting, diarrhea, constipation, dizziness, abdominal pain, skin rash, fevers, chills, night sweats, weight loss, swollen lymph nodes, body aches, joint swelling, muscle aches, chest pain, shortness of breath, mood changes.   Objective Blood pressure 130/78, pulse 115, height 5\' 6"  (1.676 m), weight 190 lb (86.183 kg), SpO2 98 %.  General: No apparent distress alert and oriented x3 mood and affect normal, dressed appropriately.  HEENT: Pupils equal, extraocular movements intact  Respiratory: Patient's speak in full sentences and does not appear short of breath  Cardiovascular: No lower extremity edema, non tender, no erythema  Skin:  No rash noted in the skin today. But not to the exam. No masses palpated. Neurovascularly intact. Abdomen: Soft nontender  No organomegaly Neuro: Cranial nerves II through XII are intact, neurovascularly intact in all extremities with 2+ DTRs and 2+ pulses.  Lymph: No lymphadenopathy of posterior or anterior cervical chain or axillae  bilaterally.  Gait normal with good balance and coordination.  MSK:  Non tender with full range of motion and good stability and symmetric strength and tone of  elbows, wrist, hip, knee and ankles bilaterally.  Neck: Inspection unremarkable. No palpable stepoffs. Negative Spurling's maneuver. Full neck range of motion Grip strength and sensation normal in bilateral hands Strength good C4 to T1 distribution No sensory change to C4 to T1 Negative Hoffman sign bilaterally Reflexes normal    OMT Physical Exam  Cervical  C2 flexed rotated and side bent right C7 flexed rotated and side bent left  Thoracic T3 extended rotated and side bent left inhaled third rib Group curve of T5-T7 rotated right and side bent left significant increase in muscle tenderness     Impression and Recommendations:     This case required medical decision making of moderate complexity.

## 2015-05-23 NOTE — Telephone Encounter (Signed)
Refill request for Cyclobenzaprine. Pt last saw Dr. Tamala Julian on 05/22/15 for neck pain. Please advise

## 2015-05-24 ENCOUNTER — Ambulatory Visit (INDEPENDENT_AMBULATORY_CARE_PROVIDER_SITE_OTHER): Payer: Medicare Other | Admitting: Endocrinology

## 2015-05-24 ENCOUNTER — Other Ambulatory Visit: Payer: Self-pay

## 2015-05-24 ENCOUNTER — Encounter: Payer: Self-pay | Admitting: Endocrinology

## 2015-05-24 VITALS — BP 140/70 | HR 71 | Temp 98.1°F | Wt 189.0 lb

## 2015-05-24 DIAGNOSIS — E1165 Type 2 diabetes mellitus with hyperglycemia: Secondary | ICD-10-CM | POA: Diagnosis not present

## 2015-05-24 DIAGNOSIS — IMO0002 Reserved for concepts with insufficient information to code with codable children: Secondary | ICD-10-CM

## 2015-05-24 LAB — HEMOGLOBIN A1C: HEMOGLOBIN A1C: 6.6 % — AB (ref 4.6–6.5)

## 2015-05-24 NOTE — Telephone Encounter (Signed)
Refill done.  

## 2015-05-24 NOTE — Telephone Encounter (Signed)
i called and talked with Christine [name]----she had requested flexeril rx before she got the baclofen rx---baclofen is working well, i advised patient we would not be refilling flexeril, patient stated she was fine with that

## 2015-05-24 NOTE — Patient Instructions (Addendum)
check your blood sugar twice a day.  vary the time of day when you check, between before the 3 meals, and at bedtime.  also check if you have symptoms of your blood sugar being too high or too low.  please keep a record of the readings and bring it to your next appointment here.  You can write it on any piece of paper.  please call us sooner if your blood sugar goes below 70, or if you have a lot of readings over 200.   blood tests are being requested for you today.  We'll let you know about the results.   Based on the results, we may need to change the lantus to levemir  Please come back for a follow-up appointment in 3 months.

## 2015-05-24 NOTE — Progress Notes (Signed)
Subjective:    Patient ID: Christine [name], female    DOB: [birth year], 64 y.o.   MRN: [id number]  HPI Pt returns for f/u of diabetes mellitus: DM type: Insulin-requiring type 2 Dx'ed: 8315 Complications: TIA Therapy: insulin GDM: never DKA: never Severe hypoglycemia: never Pancreatitis: never Other: She wants to take insulin just QD.  pioglitizone is for NASH. Interval history: last steroid injection was yesterday.  she brings a record of her cbg's which i have reviewed today.  She checks in am only.  It varies from 60-140.  Past Medical History  Diagnosis Date  . Allergic rhinitis   . Sleep disorder     [name] May  . Diabetes mellitus   . Migraine headache     quiescent  . TIA (transient ischemic attack)   . Hyperlipidemia   . HTN (hypertension)   . History of recurrent UTIs     [name]    Past Surgical History  Procedure Laterality Date  . Polypectomy  2002    benign, hyperplastic polyp; rectal bleeding 2008  . Total abdominal hysterectomy  1983    BSO for endometriosis  . Coronary angioplasty  1997    for chest pain- negative   . Tonsillectomy    . Wisdom tooth extraction    . Colonoscopy      Dr Carlean [name]; hemorrhoids    History   Social History  . Marital Status: Married    Spouse Name: N/A  . Number of Children: N/A  . Years of Education: N/A   Occupational History  . Data Compensation Specialist     Social History Main Topics  . Smoking status: Never Smoker   . Smokeless tobacco: Not on file  . Alcohol Use: No  . Drug Use: No  . Sexual Activity: Not on file   Other Topics Concern  . Not on file   Social History Narrative   Regular exercise- no     Current Outpatient Prescriptions on File Prior to Visit  Medication Sig Dispense Refill  . aspirin 81 MG tablet Take 81 mg by mouth daily.      [name] atorvastatin (LIPITOR) 20 MG tablet Take 1 tablet (20 mg total) by mouth daily. 90 tablet 1  . baclofen (LIORESAL) 10 MG tablet Take 1 tablet  (10 mg total) by mouth 3 (three) times daily. 90 each 0  . busPIRone (BUSPAR) 15 MG tablet Take 15 mg by mouth daily.     . Cholecalciferol (VITAMIN D3) 5000 UNITS CAPS Take 5,000 Units by mouth daily.     [name] desvenlafaxine (PRISTIQ) 100 MG 24 hr tablet Take 100 mg by mouth daily.    . fish oil-omega-3 fatty acids 1000 MG capsule Take 1 g by mouth daily.     . fluticasone (FLONASE) 50 MCG/ACT nasal spray Place 1 spray into both nostrils 2 (two) times daily. 16 g 6  . glucose blood (ONE TOUCH ULTRA TEST) test strip USE 1 STRIP TWICE DAILY Dx Code E11.65 100 each 2  . HYDROcodone-acetaminophen (NORCO) 10-325 MG per tablet Take 1 tablet by mouth every 8 (eight) hours as needed. 30 tablet 0  . Insulin Glargine (LANTUS) 100 UNIT/ML Solostar Pen Inject 40 Units into the skin every morning. And pen needles 1/day 250.01    . Lancets (ONETOUCH ULTRASOFT) lancets Check blood sugar once daily. Dx code: 250.00 100 each 12  . LORazepam (ATIVAN) 0.5 MG tablet Take 0.5 mg by mouth 2 (two) times daily as needed. For anxiety.    [name]  losartan (COZAAR) 100 MG tablet TAKE 1 TABLET EVERY DAY 90 tablet 1  . pioglitazone (ACTOS) 30 MG tablet Take 1 tablet (30 mg total) by mouth daily. 30 tablet 5  . vitamin B-12 (CYANOCOBALAMIN) 250 MCG tablet Take 250 mcg by mouth daily.      . traMADol (ULTRAM) 50 MG tablet TAKE 1 TABLET BY MOUTH EVERY 12 HOURS 60 tablet 1   No current facility-administered medications on file prior to visit.    Allergies  Allergen Reactions  . Vioxx [Rofecoxib]     Excess BP which caused TIA  . Prednisone     REACTION:  Mental status changes No associated rash or fever  . Colesevelam     REACTION: Leg Pain    Family History  Problem Relation Age of Onset  . Colon cancer Maternal Aunt   . Diabetes Paternal Uncle   . Heart attack Father 61  . COPD Mother   . Lung cancer Brother     smoker  . Mental illness      niece, committed suicide.  [name] Heart attack      brother X 2; @ 30 & 49  .  Stroke Neg Hx     BP 140/70 mmHg  Pulse 71  Temp(Src) 98.1 F (36.7 C) (Oral)  Wt 189 lb (85.73 kg)  SpO2 98%  LMP  (LMP Unknown)    Review of Systems She denies LOC.    Objective:   Physical Exam VITAL SIGNS:  See vs page GENERAL: no distress Pulses: dorsalis pedis intact bilat.   MSK: no deformity of the feet CV: no leg edema Skin:  no ulcer on the feet.  normal color and temp on the feet. Neuro: sensation is intact to touch on the feet  Lab Results  Component Value Date   HGBA1C 6.6* 05/24/2015      Assessment & Plan:  DM: overcontrolled, given this regimen, which does match insulin to her changing needs throughout the day  Patient is advised the following: Patient Instructions  check your blood sugar twice a day.  vary the time of day when you check, between before the 3 meals, and at bedtime.  also check if you have symptoms of your blood sugar being too high or too low.  please keep a record of the readings and bring it to your next appointment here.  You can write it on any piece of paper.  please call us sooner if your blood sugar goes below 70, or if you have a lot of readings over 200.   blood tests are being requested for you today.  We'll let you know about the results.   Based on the results, we may need to change the lantus to levemir  Please come back for a follow-up appointment in 3 months.     addendum: reduce lantus to 40/d

## 2015-05-24 NOTE — Telephone Encounter (Signed)
Patients last fill date for flexeril was march, 2016 and last office visit was April, 2016---currently not showing on patients med list, i found med on med history list----please advise, thanks

## 2015-05-24 NOTE — Telephone Encounter (Signed)
Baclofen was just sent in this week. If it is not working then we can switch to flexeril, otherwise continue baclofen prescribed by Dr. Tamala Julian.

## 2015-05-27 ENCOUNTER — Encounter: Payer: Self-pay | Admitting: Family Medicine

## 2015-06-10 ENCOUNTER — Other Ambulatory Visit: Payer: Self-pay

## 2015-06-10 ENCOUNTER — Ambulatory Visit (INDEPENDENT_AMBULATORY_CARE_PROVIDER_SITE_OTHER): Payer: Medicare Other | Admitting: Family Medicine

## 2015-06-10 ENCOUNTER — Encounter: Payer: Self-pay | Admitting: Family Medicine

## 2015-06-10 VITALS — BP 130/78 | HR 76 | Ht 66.0 in | Wt 187.0 lb

## 2015-06-10 DIAGNOSIS — M9901 Segmental and somatic dysfunction of cervical region: Secondary | ICD-10-CM | POA: Diagnosis not present

## 2015-06-10 DIAGNOSIS — M542 Cervicalgia: Secondary | ICD-10-CM | POA: Diagnosis not present

## 2015-06-10 DIAGNOSIS — M9908 Segmental and somatic dysfunction of rib cage: Secondary | ICD-10-CM

## 2015-06-10 DIAGNOSIS — M999 Biomechanical lesion, unspecified: Secondary | ICD-10-CM

## 2015-06-10 DIAGNOSIS — M9902 Segmental and somatic dysfunction of thoracic region: Secondary | ICD-10-CM | POA: Diagnosis not present

## 2015-06-10 NOTE — Patient Instructions (Signed)
Good to see you You are doing much better  With gardening season work on stretches and stay hydrated Continue your other exercises Lets space you out to 4 weeks.

## 2015-06-10 NOTE — Progress Notes (Signed)
  [name] [address] [place], [place] [address] Phone: [phone] Subjective:    CC: Neck and upper back pain follow-up  [id number] Christine [name] is a 64 y.o. female coming in for follow-up of her neck pain. Patient has been seen me for osteopathic manipulation. Patient does have a flare after last manipulation. Patient states that she has been doing much better. Patient though has been taking care of her one year-old grand son and doing a lot more lifting than she usually can tolerate. Patient states though that the pain seems to be relatively manage. Patient denies any radiation to the arms. Nothing that is stopping her from activities. Patient is staying very active and is very happy with the results.  Past medical history, social, surgical and family history all reviewed in electronic medical record.   Review of Systems: No headache, visual changes, nausea, vomiting, diarrhea, constipation, dizziness, abdominal pain, skin rash, fevers, chills, night sweats, weight loss, swollen lymph nodes, body aches, joint swelling, muscle aches, chest pain, shortness of breath, mood changes.   Objective Blood pressure 130/78, pulse 76, height 5\' 6"  (1.676 m), weight 187 lb (84.823 kg), SpO2 98 %.  General: No apparent distress alert and oriented x3 mood and affect normal, dressed appropriately.  HEENT: Pupils equal, extraocular movements intact  Respiratory: Patient's speak in full sentences and does not appear short of breath  Cardiovascular: No lower extremity edema, non tender, no erythema  Skin:  No rash noted in the skin today. But not to the exam. No masses palpated. Neurovascularly intact. Abdomen: Soft nontender  No organomegaly Neuro: Cranial nerves II through XII are intact, neurovascularly intact in all extremities with 2+ DTRs and 2+ pulses.  Lymph: No lymphadenopathy of posterior or anterior cervical chain or axillae bilaterally.  Gait normal  with good balance and coordination.  MSK:  Non tender with full range of motion and good stability and symmetric strength and tone of  elbows, wrist, hip, knee and ankles bilaterally.  Neck: Inspection unremarkable. No palpable stepoffs. Negative Spurling's maneuver. Full neck range of motion Grip strength and sensation normal in bilateral hands Strength good C4 to T1 distribution No sensory change to C4 to T1 Negative Hoffman sign bilaterally Reflexes normal    OMT Physical Exam  Cervical  C2 flexed rotated and side bent right C5 flexed rotated and side bent left  Thoracic T3 extended rotated and side bent left inhale rib 3rd  T7 extended rotated and side bent right       Impression and Recommendations:     This case required medical decision making of moderate complexity.

## 2015-06-10 NOTE — Progress Notes (Signed)
Pre visit review using our clinic review tool, if applicable. No additional management support is needed unless otherwise documented below in the visit note. 

## 2015-06-10 NOTE — Assessment & Plan Note (Signed)
Decision today to treat with OMT was based on Physical Exam  After verbal consent patient was treated with HVLA, ME, FPR techniques in cervical, thoracic, lumbar areas  Patient tolerated the procedure well with improvement in symptoms  Patient given exercises, stretches and lifestyle modifications  See medications in patient instructions if given  Patient will follow up in 4-6 weeks 

## 2015-06-10 NOTE — Assessment & Plan Note (Signed)
Patient has made considerable strides over the course of time. Patient initially and pain medication significantly less than continues to do the over-the-counter natural supplementation. Patient seems to do relatively well and then unfortunately stops doing the exercises. We discussed the importance of continuing this on a fairly regular basis. Patient will continue to be active. We may need to consider other medications the patient has any difficulties but I think at this time will continue with the natural supplementations. Patient will come back and see me again in 4-6 weeks for further evaluation and treatment.

## 2015-06-14 ENCOUNTER — Telehealth: Payer: Self-pay

## 2015-06-14 NOTE — Telephone Encounter (Signed)
Left message advising patient that mammogram is due 

## 2015-06-14 NOTE — Telephone Encounter (Signed)
Patient chart updated. 

## 2015-06-14 NOTE — Telephone Encounter (Signed)
Patient states she is up to date on mammogram.  Had 3D mammogram with [facility] the same time she had her DEXA.  Patient states this was either late last year or the beginning of this year.

## 2015-06-15 ENCOUNTER — Other Ambulatory Visit: Payer: Self-pay | Admitting: Family Medicine

## 2015-06-18 NOTE — Telephone Encounter (Signed)
Refill done.  

## 2015-07-09 ENCOUNTER — Ambulatory Visit (INDEPENDENT_AMBULATORY_CARE_PROVIDER_SITE_OTHER): Payer: Medicare Other | Admitting: Family Medicine

## 2015-07-09 ENCOUNTER — Ambulatory Visit: Payer: Medicare Other | Admitting: Family Medicine

## 2015-07-09 ENCOUNTER — Encounter: Payer: Self-pay | Admitting: Family Medicine

## 2015-07-09 VITALS — BP 114/82 | HR 89 | Ht 66.0 in | Wt 187.0 lb

## 2015-07-09 DIAGNOSIS — M9908 Segmental and somatic dysfunction of rib cage: Secondary | ICD-10-CM

## 2015-07-09 DIAGNOSIS — M542 Cervicalgia: Secondary | ICD-10-CM

## 2015-07-09 DIAGNOSIS — M9902 Segmental and somatic dysfunction of thoracic region: Secondary | ICD-10-CM | POA: Diagnosis not present

## 2015-07-09 DIAGNOSIS — M9901 Segmental and somatic dysfunction of cervical region: Secondary | ICD-10-CM

## 2015-07-09 DIAGNOSIS — M999 Biomechanical lesion, unspecified: Secondary | ICD-10-CM

## 2015-07-09 NOTE — Assessment & Plan Note (Signed)
Discussed with patient at great length. We discussed icing regimen and home exercises. We discussed what activities to do an which was to avoid. We discussed postural changes with patient doing more hardening recently. Patient will try to make these different changes and come back and see me again in 4-6 weeks for further evaluation and treatment.

## 2015-07-09 NOTE — Patient Instructions (Addendum)
Good to see you Good luck with the living room and the garden Keep doing what you are dong and work on posture See me again in 4-6 weeks

## 2015-07-09 NOTE — Progress Notes (Signed)
  [name] [address] [place], [place] [address] Phone: [phone] Subjective:    CC: Neck and upper back pain follow-up  [id number] Christine [name] is a 64 y.o. female coming in for follow-up of her neck pain. Patient has been seen me for osteopathic manipulation. Patient has been doing very well but has been doing a lot more yard work recently. Patient has also been doing a lot of overhead activity. Patient denies any numbness or tingling any radiation of pain. Denies any new symptoms and states that overall she continues to improve. Nothing that is stopping her from daily activities.  Past medical history, social, surgical and family history all reviewed in electronic medical record.   Review of Systems: No headache, visual changes, nausea, vomiting, diarrhea, constipation, dizziness, abdominal pain, skin rash, fevers, chills, night sweats, weight loss, swollen lymph nodes, body aches, joint swelling, muscle aches, chest pain, shortness of breath, mood changes.   Objective Blood pressure 114/82, pulse 89, height 5\' 6"  (1.676 m), weight 187 lb (84.823 kg), SpO2 97 %.  General: No apparent distress alert and oriented x3 mood and affect normal, dressed appropriately.  HEENT: Pupils equal, extraocular movements intact  Respiratory: Patient's speak in full sentences and does not appear short of breath  Cardiovascular: No lower extremity edema, non tender, no erythema  Skin:  No rash noted in the skin today. But not to the exam. No masses palpated. Neurovascularly intact. Abdomen: Soft nontender  No organomegaly Neuro: Cranial nerves II through XII are intact, neurovascularly intact in all extremities with 2+ DTRs and 2+ pulses.  Lymph: No lymphadenopathy of posterior or anterior cervical chain or axillae bilaterally.  Gait normal with good balance and coordination.  MSK:  Non tender with full range of motion and good stability and symmetric strength and  tone of  elbows, wrist, hip, knee and ankles bilaterally.  Neck: Inspection unremarkable. No palpable stepoffs. Negative Spurling's maneuver. Full neck range of motion Grip strength and sensation normal in bilateral hands Strength good C4 to T1 distribution No sensory change to C4 to T1 Negative Hoffman sign bilaterally Reflexes normal    OMT Physical Exam  Cervical  C2 flexed rotated and side bent right C5 flexed rotated and side bent left  Thoracic T1 extended rotated and side bent right with elevated first rib T3 extended rotated and side bent left inhale rib 3rd  T7 extended rotated and side bent right   lumbar L2 flexed rotated and side bent right      Impression and Recommendations:     This case required medical decision making of moderate complexity.

## 2015-07-09 NOTE — Progress Notes (Signed)
Pre visit review using our clinic review tool, if applicable. No additional management support is needed unless otherwise documented below in the visit note. 

## 2015-07-09 NOTE — Assessment & Plan Note (Signed)
Decision today to treat with OMT was based on Physical Exam  After verbal consent patient was treated with HVLA, ME, FPR techniques in cervical, thoracic, lumbar areas  Patient tolerated the procedure well with improvement in symptoms  Patient given exercises, stretches and lifestyle modifications  See medications in patient instructions if given  Patient will follow up in 4-6 weeks 

## 2015-08-15 DIAGNOSIS — R457 State of emotional shock and stress, unspecified: Secondary | ICD-10-CM | POA: Diagnosis not present

## 2015-08-21 ENCOUNTER — Ambulatory Visit (INDEPENDENT_AMBULATORY_CARE_PROVIDER_SITE_OTHER): Payer: Medicare Other | Admitting: Family Medicine

## 2015-08-21 ENCOUNTER — Encounter: Payer: Self-pay | Admitting: Family Medicine

## 2015-08-21 VITALS — BP 122/78 | HR 72 | Wt 191.0 lb

## 2015-08-21 DIAGNOSIS — M7061 Trochanteric bursitis, right hip: Secondary | ICD-10-CM | POA: Insufficient documentation

## 2015-08-21 DIAGNOSIS — M999 Biomechanical lesion, unspecified: Secondary | ICD-10-CM

## 2015-08-21 DIAGNOSIS — M9901 Segmental and somatic dysfunction of cervical region: Secondary | ICD-10-CM

## 2015-08-21 DIAGNOSIS — M9908 Segmental and somatic dysfunction of rib cage: Secondary | ICD-10-CM | POA: Diagnosis not present

## 2015-08-21 DIAGNOSIS — M542 Cervicalgia: Secondary | ICD-10-CM

## 2015-08-21 DIAGNOSIS — M9902 Segmental and somatic dysfunction of thoracic region: Secondary | ICD-10-CM | POA: Diagnosis not present

## 2015-08-21 NOTE — Patient Instructions (Signed)
Good to see you.  Ice 20 minutes 2 times daily. Usually after activity and before bed. Exercises 3 times a week.  pennsaid pinkie amount topically 2 times daily as needed.  Duexis 3 times a day for 5-7 days.  If hip not a lot better seem me again in 3 weeks and we will inject otherwise move back to 6-8 weeks.

## 2015-08-21 NOTE — Assessment & Plan Note (Signed)
Patient is been doing significantly better at this time. We discussed icing regimen and home exercises. Patient will continue to do her regular activities. Patient will continue to work on her posture. Patient come back and see me again in 6 weeks for further evaluation and treatment.

## 2015-08-21 NOTE — Assessment & Plan Note (Signed)
Patient doing very well but we discussed topical anti-inflammatory some patient was given a trial size. We discussed icing regimen and home exercises. Patient was given handout. Patient and will come back and see me again in 3 weeks. If having any worsening symptoms we'll consider injection.

## 2015-08-21 NOTE — Assessment & Plan Note (Signed)

## 2015-08-21 NOTE — Progress Notes (Signed)
  [name] [place] [place], [place] [address] Phone: [phone] Subjective:    CC: Neck and upper back pain follow-up  [id number] Christine [name] is a 64 y.o. female coming in for follow-up of her neck pain. Patient has been doing very well. Patient has been doing the exercises fairly regularly. Not have as stresses usual. Denies any radiation hands. States that she is very happy with the results at this moment.  Complaining of some pain on the lateral aspect of the hip. Mostly on the right side.patient states that it is worse when she goes from a seated to standing position after a long amount of time. An states that even laying on it at night can be uncomfortable. No radiation down the leg at this time. Nothing that is stopping her daily activities.  Past medical history, social, surgical and family history all reviewed in electronic medical record.   Review of Systems: No headache, visual changes, nausea, vomiting, diarrhea, constipation, dizziness, abdominal pain, skin rash, fevers, chills, night sweats, weight loss, swollen lymph nodes, body aches, joint swelling, muscle aches, chest pain, shortness of breath, mood changes.   Objective Blood pressure 122/78, pulse 72, weight 191 lb (86.637 kg).  General: No apparent distress alert and oriented x3 mood and affect normal, dressed appropriately.  HEENT: Pupils equal, extraocular movements intact  Respiratory: Patient's speak in full sentences and does not appear short of breath  Cardiovascular: No lower extremity edema, non tender, no erythema  Skin:  No rash noted in the skin today. But not to the exam. No masses palpated. Neurovascularly intact. Abdomen: Soft nontender  No organomegaly Neuro: Cranial nerves II through XII are intact, neurovascularly intact in all extremities with 2+ DTRs and 2+ pulses.  Lymph: No lymphadenopathy of posterior or anterior cervical chain or axillae bilaterally.    Gait normal with good balance and coordination.  MSK:  Non tender with full range of motion and good stability and symmetric strength and tone of  elbows, wrist, hip, knee and ankles bilaterally.  Right hip shows the patient is tender to palpation over the greater trochanteric area. Neck: Inspection unremarkable. No palpable stepoffs. Negative Spurling's maneuver. Full neck range of motion Grip strength and sensation normal in bilateral hands Strength good C4 to T1 distribution No sensory change to C4 to T1 Negative Hoffman sign bilaterally Reflexes normal    OMT Physical Exam  Cervical  C2 flexed rotated and side bent right C6 flexed rotated and side bent left  Thoracic T1 extended rotated and side bent right with elevated first rib T3 extended rotated and side bent left inhale rib 3rd  T5 extended rotated and side bent right   lumbar L2 flexed rotated and side bent right  Sacrum left on left    Impression and Recommendations:     This case required medical decision making of moderate complexity.

## 2015-08-26 ENCOUNTER — Ambulatory Visit: Payer: Medicare Other | Admitting: Endocrinology

## 2015-09-03 ENCOUNTER — Ambulatory Visit (INDEPENDENT_AMBULATORY_CARE_PROVIDER_SITE_OTHER): Payer: Medicare Other | Admitting: Endocrinology

## 2015-09-03 ENCOUNTER — Encounter: Payer: Self-pay | Admitting: Endocrinology

## 2015-09-03 VITALS — BP 134/82 | HR 86 | Temp 98.2°F | Ht 66.0 in | Wt 193.0 lb

## 2015-09-03 DIAGNOSIS — E1165 Type 2 diabetes mellitus with hyperglycemia: Secondary | ICD-10-CM

## 2015-09-03 DIAGNOSIS — IMO0002 Reserved for concepts with insufficient information to code with codable children: Secondary | ICD-10-CM

## 2015-09-03 LAB — POCT GLYCOSYLATED HEMOGLOBIN (HGB A1C): Hemoglobin A1C: 6

## 2015-09-03 NOTE — Patient Instructions (Addendum)
check your blood sugar twice a day.  vary the time of day when you check, between before the 3 meals, and at bedtime.  also check if you have symptoms of your blood sugar being too high or too low.  please keep a record of the readings and bring it to your next appointment here.  You can write it on any piece of paper.  please call us sooner if your blood sugar goes below 70, or if you have a lot of readings over 200.   Please reduce the lantus to 30 units each morning.   On this type of insulin schedule, you should eat meals on a regular schedule.  If a meal is missed or significantly delayed, your blood sugar could go low.  Please come back for a follow-up appointment in 4 months.

## 2015-09-03 NOTE — Progress Notes (Signed)
Subjective:    Patient ID: Christine [name], female    DOB: [birth year], 64 y.o.   MRN: [id number]  HPI Pt returns for f/u of diabetes mellitus: DM type: Insulin-requiring type 2 Dx'ed: 5364 Complications: TIA Therapy: insulin GDM: never DKA: never Severe hypoglycemia: never. Pancreatitis: never Other: She wants to take insulin just QD.  pioglitizone is for NASH. Interval history: pt states she feels well in general.  no cbg record, but states cbg's vary from 66-105.   Past Medical History  Diagnosis Date  . Allergic rhinitis   . Sleep disorder     [name] May  . Diabetes mellitus   . Migraine headache     quiescent  . TIA (transient ischemic attack)   . Hyperlipidemia   . HTN (hypertension)   . History of recurrent UTIs     [name]    Past Surgical History  Procedure Laterality Date  . Polypectomy  2002    benign, hyperplastic polyp; rectal bleeding 2008  . Total abdominal hysterectomy  1983    BSO for endometriosis  . Coronary angioplasty  1997    for chest pain- negative   . Tonsillectomy    . Wisdom tooth extraction    . Colonoscopy      Dr Carlean [name]; hemorrhoids    Social History   Social History  . Marital Status: Married    Spouse Name: N/A  . Number of Children: N/A  . Years of Education: N/A   Occupational History  . Data Compensation Specialist     Social History Main Topics  . Smoking status: Never Smoker   . Smokeless tobacco: Not on file  . Alcohol Use: No  . Drug Use: No  . Sexual Activity: Not on file   Other Topics Concern  . Not on file   Social History Narrative   Regular exercise- no     Current Outpatient Prescriptions on File Prior to Visit  Medication Sig Dispense Refill  . aspirin 81 MG tablet Take 81 mg by mouth daily.      [name] atorvastatin (LIPITOR) 20 MG tablet Take 1 tablet (20 mg total) by mouth daily. 90 tablet 1  . baclofen (LIORESAL) 10 MG tablet TAKE 1 TABLET (10 MG TOTAL) BY MOUTH 3 (THREE) TIMES DAILY. 90  tablet 1  . Cholecalciferol (VITAMIN D3) 5000 UNITS CAPS Take 5,000 Units by mouth daily.     [name] desvenlafaxine (PRISTIQ) 100 MG 24 hr tablet Take 100 mg by mouth daily.    . fish oil-omega-3 fatty acids 1000 MG capsule Take 1 g by mouth daily.     . fluticasone (FLONASE) 50 MCG/ACT nasal spray Place 1 spray into both nostrils 2 (two) times daily. 16 g 6  . glucose blood (ONE TOUCH ULTRA TEST) test strip USE 1 STRIP TWICE DAILY Dx Code E11.65 100 each 2  . HYDROcodone-acetaminophen (NORCO) 10-325 MG per tablet Take 1 tablet by mouth every 8 (eight) hours as needed. 30 tablet 0  . Insulin Glargine (LANTUS) 100 UNIT/ML Solostar Pen Inject 30 Units into the skin every morning. And pen needles 1/day 250.01    . Lancets (ONETOUCH ULTRASOFT) lancets Check blood sugar once daily. Dx code: 250.00 100 each 12  . LORazepam (ATIVAN) 0.5 MG tablet Take 0.5 mg by mouth 2 (two) times daily as needed. For anxiety.    [name] losartan (COZAAR) 100 MG tablet TAKE 1 TABLET EVERY DAY 90 tablet 1  . pioglitazone (ACTOS) 30 MG tablet Take 1  tablet (30 mg total) by mouth daily. 30 tablet 5  . traMADol (ULTRAM) 50 MG tablet TAKE 1 TABLET BY MOUTH EVERY 12 HOURS 60 tablet 1  . vitamin B-12 (CYANOCOBALAMIN) 250 MCG tablet Take 250 mcg by mouth daily.      . busPIRone (BUSPAR) 15 MG tablet Take 15 mg by mouth daily.      No current facility-administered medications on file prior to visit.    Allergies  Allergen Reactions  . Vioxx [Rofecoxib]     Excess BP which caused TIA  . Prednisone     REACTION:  Mental status changes No associated rash or fever  . Colesevelam     REACTION: Leg Pain    Family History  Problem Relation Age of Onset  . Colon cancer Maternal Aunt   . Diabetes Paternal Uncle   . Heart attack Father 98  . COPD Mother   . Lung cancer Brother     smoker  . Mental illness      niece, committed suicide.  [name] Heart attack      brother X 2; @ 34 & 53  . Stroke Neg Hx     BP 134/82 mmHg  Pulse 86   Temp(Src) 98.2 F (36.8 C) (Oral)  Ht 5\' 6"  (1.676 m)  Wt 193 lb (87.544 kg)  BMI 31.17 kg/m2  SpO2 93%  LMP  (LMP Unknown)  Review of Systems She denies weight change.    Objective:   Physical Exam VITAL SIGNS:  See vs page GENERAL: no distress Pulses: dorsalis pedis intact bilat.   MSK: no deformity of the feet CV: no leg edema.  Skin:  no ulcer on the feet.  normal color and temp on the feet. Neuro: sensation is intact to touch on the feet.      A1c=6.0%    Assessment & Plan:  DM: overcontrolled, given this regimen, which does match insulin to her changing needs throughout the day.    Patient is advised the following: Patient Instructions  check your blood sugar twice a day.  vary the time of day when you check, between before the 3 meals, and at bedtime.  also check if you have symptoms of your blood sugar being too high or too low.  please keep a record of the readings and bring it to your next appointment here.  You can write it on any piece of paper.  please call us sooner if your blood sugar goes below 70, or if you have a lot of readings over 200.   Please reduce the lantus to 30 units each morning.   On this type of insulin schedule, you should eat meals on a regular schedule.  If a meal is missed or significantly delayed, your blood sugar could go low.  Please come back for a follow-up appointment in 4 months.

## 2015-09-07 ENCOUNTER — Other Ambulatory Visit: Payer: Self-pay | Admitting: Endocrinology

## 2015-09-09 ENCOUNTER — Emergency Department (INDEPENDENT_AMBULATORY_CARE_PROVIDER_SITE_OTHER)
Admission: EM | Admit: 2015-09-09 | Discharge: 2015-09-09 | Disposition: A | Discharge status: Home / Self Care | Payer: Medicare Other | Source: Home / Self Care

## 2015-09-09 ENCOUNTER — Encounter (HOSPITAL_COMMUNITY): Payer: Self-pay | Admitting: Family Medicine

## 2015-09-09 DIAGNOSIS — E119 Type 2 diabetes mellitus without complications: Secondary | ICD-10-CM | POA: Diagnosis not present

## 2015-09-09 DIAGNOSIS — G43009 Migraine without aura, not intractable, without status migrainosus: Secondary | ICD-10-CM | POA: Diagnosis not present

## 2015-09-09 MED ORDER — ACETAMINOPHEN 325 MG PO TABS
975.0000 mg | ORAL_TABLET | Freq: Once | ORAL | Status: AC
  Administered 2015-09-09: 975 mg via ORAL

## 2015-09-09 MED ORDER — ACETAMINOPHEN 325 MG PO TABS
ORAL_TABLET | ORAL | Status: AC
  Filled 2015-09-09: qty 3

## 2015-09-09 MED ORDER — DEXAMETHASONE 2 MG PO TABS
10.0000 mg | ORAL_TABLET | Freq: Once | ORAL | Status: AC
  Administered 2015-09-09: 10 mg via ORAL

## 2015-09-09 MED ORDER — SUMATRIPTAN SUCCINATE 6 MG/0.5ML ~~LOC~~ SOLN
6.0000 mg | Freq: Once | SUBCUTANEOUS | Status: AC
  Administered 2015-09-09: 6 mg via SUBCUTANEOUS

## 2015-09-09 MED ORDER — DEXAMETHASONE 2 MG PO TABS
ORAL_TABLET | ORAL | Status: AC
  Filled 2015-09-09: qty 1

## 2015-09-09 MED ORDER — DEXAMETHASONE 4 MG PO TABS
ORAL_TABLET | ORAL | Status: AC
  Filled 2015-09-09: qty 2

## 2015-09-09 MED ORDER — SUMATRIPTAN SUCCINATE 6 MG/0.5ML ~~LOC~~ SOLN
SUBCUTANEOUS | Status: AC
  Filled 2015-09-09: qty 0.5

## 2015-09-09 MED ORDER — ONDANSETRON HCL 4 MG PO TABS: 4.0000 mg | ORAL_TABLET | Freq: Three times a day (TID) | ORAL | Status: DC | PRN

## 2015-09-09 NOTE — Discharge Instructions (Signed)
You're suffering from migraine headache that may also cause from the supraorbital nerve becoming irritated. Your given a dose of echo drawn which is steroid which should help significantly with her symptoms. He also given a shot of Imitrex which is a excellent antimigraine medicine. Please go home and take Zofran to help with your nausea and additional 25-50 mg of Benadryl. Please get plenty of rest. Please follow with primary care physician or emergency room if your symptoms do not improve or get worse. Please make sure to monitor your sugars closely as the steroid will increase your sugar.

## 2015-09-09 NOTE — ED Provider Notes (Signed)
CSN: [id number]     Arrival date & time 09/09/15  1304 History   None    Chief Complaint  Patient presents with  . Headache   (Consider location/radiation/quality/duration/timing/severity/associated sxs/prior Treatment) HPI   HA started last night.  Frontal Constant Unchanged since onset. Associated with some pain to palpation just above the right eye. 400 Advil w/o much improveemtn. Associated w/ nausea No fevers.  Feels like a migraine.   Intermittent allergies.  Denies any fevers, chest pain, short of breath, palpitations, diarrhea,, neck stiffness, altered mental status    H/o migraines  Patient states that her home glucoses very well monitored and she factors told to decrease her Lantus due to her A1c being around 6.1.  Past Medical History  Diagnosis Date  . Allergic rhinitis   . Sleep disorder     [name] May  . Diabetes mellitus   . Migraine headache     quiescent  . TIA (transient ischemic attack)   . Hyperlipidemia   . HTN (hypertension)   . History of recurrent UTIs     [name]   Past Surgical History  Procedure Laterality Date  . Polypectomy  2002    benign, hyperplastic polyp; rectal bleeding 2008  . Total abdominal hysterectomy  1983    BSO for endometriosis  . Coronary angioplasty  1997    for chest pain- negative   . Tonsillectomy    . Wisdom tooth extraction    . Colonoscopy      Dr Carlean [name]; hemorrhoids   Family History  Problem Relation Age of Onset  . Colon cancer Maternal Aunt   . Diabetes Paternal Uncle   . Heart attack Father 40  . COPD Mother   . Lung cancer Brother     smoker  . Mental illness      niece, committed suicide.  [name] Heart attack      brother X 2; @ 26 & 51  . Stroke Neg Hx    Social History  Substance Use Topics  . Smoking status: Never Smoker   . Smokeless tobacco: None  . Alcohol Use: No   OB History    No data available     Review of Systems Per HPI with all other pertinent systems negative.    Allergies  Vioxx; Prednisone; and Colesevelam  Home Medications   Prior to Admission medications   Medication Sig Start Date End Date Taking? Authorizing Provider  aspirin 81 MG tablet Take 81 mg by mouth daily.      Historical Provider, MD  atorvastatin (LIPITOR) 20 MG tablet Take 1 tablet (20 mg total) by mouth daily. 04/02/15   Hendricks Limes, MD  baclofen (LIORESAL) 10 MG tablet TAKE 1 TABLET (10 MG TOTAL) BY MOUTH 3 (THREE) TIMES DAILY. 06/18/15   Lyndal Pulley, DO  busPIRone (BUSPAR) 15 MG tablet Take 15 mg by mouth daily.     Historical Provider, MD  Cholecalciferol (VITAMIN D3) 5000 UNITS CAPS Take 5,000 Units by mouth daily.     Historical Provider, MD  desvenlafaxine (PRISTIQ) 100 MG 24 hr tablet Take 100 mg by mouth daily.    Historical Provider, MD  fish oil-omega-3 fatty acids 1000 MG capsule Take 1 g by mouth daily.     Historical Provider, MD  fluticasone (FLONASE) 50 MCG/ACT nasal spray Place 1 spray into both nostrils 2 (two) times daily. 12/31/14   Irene Pap, NP  glucose blood (ONE TOUCH ULTRA TEST) test strip USE 1 STRIP  TWICE DAILY Dx Code E11.65 02/12/15   Renato Shin, MD  HYDROcodone-acetaminophen [facility]) 10-325 MG per tablet Take 1 tablet by mouth every 8 (eight) hours as needed. 03/22/15   Hendricks Limes, MD  Insulin Glargine (LANTUS) 100 UNIT/ML Solostar Pen Inject 30 Units into the skin every morning. And pen needles 1/day 250.01 09/05/14   Renato Shin, MD  Lancets [facility] ULTRASOFT) lancets Check blood sugar once daily. Dx code: 250.00 11/08/13   Hendricks Limes, MD  LANTUS SOLOSTAR 100 UNIT/ML Solostar Pen INJECT 35 UNITS INTO THE SKIN EVERY MORNING. AND PEN NEEDLES 1/DAY 250.01 09/09/15   Renato Shin, MD  LORazepam (ATIVAN) 0.5 MG tablet Take 0.5 mg by mouth 2 (two) times daily as needed. For anxiety.    Historical Provider, MD  losartan (COZAAR) 100 MG tablet TAKE 1 TABLET EVERY DAY 04/02/15   Hendricks Limes, MD  ondansetron (ZOFRAN) 4 MG tablet Take  1-2 tablets (4-8 mg total) by mouth every 8 (eight) hours as needed for nausea or vomiting. 09/09/15   Waldemar Dickens, MD  pioglitazone (ACTOS) 30 MG tablet Take 1 tablet (30 mg total) by mouth daily. 01/24/15   Renato Shin, MD  traMADol (ULTRAM) 50 MG tablet TAKE 1 TABLET BY MOUTH EVERY 12 HOURS 05/24/15   Lyndal Pulley, DO  vitamin B-12 (CYANOCOBALAMIN) 250 MCG tablet Take 250 mcg by mouth daily.      Historical Provider, MD   Meds Ordered and Administered this Visit   Medications  SUMAtriptan (IMITREX) injection 6 mg (not administered)  dexamethasone (DECADRON) tablet 10 mg (not administered)  acetaminophen (TYLENOL) tablet 975 mg (not administered)    BP 153/91 mmHg  Pulse 90  Temp(Src) 97.2 F (36.2 C) (Oral)  Resp 20  SpO2 99%  LMP  (LMP Unknown) No data found.   Physical Exam Physical Exam  Constitutional: oriented to person, place, and time. appears well-developed and well-nourished. No distress.  HENT:  Head: Normocephalic and atraumatic.  Eyes: EOMI. PERRL.  Neck: Normal range of motion.  Cardiovascular: RRR, no m/r/g, 2+ distal pulses,  Pulmonary/Chest: Effort normal and breath sounds normal. No respiratory distress.  Abdominal: Soft. Bowel sounds are normal. NonTTP, no distension.  Musculoskeletal: Normal range of motion. Non ttp, no effusion.  Neurological: alert and oriented to person, place, and time.  Skin: Skin is warm. No rash noted. non diaphoretic.  Psychiatric: normal mood and affect. behavior is normal. Judgment and thought content normal.   ED Course  Procedures (including critical care time)  Labs Review Labs Reviewed - No data to display  Imaging Review No results found.   Visual Acuity Review  Right Eye Distance:   Left Eye Distance:   Bilateral Distance:    Right Eye Near:   Left Eye Near:    Bilateral Near:         MDM   1. Migraine without aura and without status migrainosus, not intractable   2. Well controlled diabetes  mellitus    Decadron 10 mg by mouth and Tylenol 975 mg by mouth and Imitrex 6 mg subcutaneous administered in clinic. Patient understands that prednisone and Decadron on the steroids sensation should be fine on this medicine. Patient will monitor her glucose closely as she is diabetic but her last A1c was in the prediabetic range. Patient additional perception for Zofran to help with her nausea and instructed to take some Benadryl tonight when she gets home to help with her overall symptoms.    Grayling Congress  Marily Memos, MD 09/09/15 1345

## 2015-09-09 NOTE — ED Notes (Signed)
Pt was triaged by Dr. Marily Memos.  Pt has had an extreme sinus headache starting today, per her symptom sheet.

## 2015-09-10 ENCOUNTER — Ambulatory Visit: Payer: Medicare Other | Admitting: Family Medicine

## 2015-09-10 ENCOUNTER — Encounter: Payer: Self-pay | Admitting: Endocrinology

## 2015-09-13 ENCOUNTER — Encounter: Payer: Self-pay | Admitting: Internal Medicine

## 2015-09-13 ENCOUNTER — Ambulatory Visit (INDEPENDENT_AMBULATORY_CARE_PROVIDER_SITE_OTHER): Payer: Medicare Other | Admitting: Internal Medicine

## 2015-09-13 VITALS — BP 124/76 | HR 77 | Temp 98.0°F | Resp 16 | Wt 191.0 lb

## 2015-09-13 DIAGNOSIS — R51 Headache: Secondary | ICD-10-CM | POA: Diagnosis not present

## 2015-09-13 DIAGNOSIS — R519 Headache, unspecified: Secondary | ICD-10-CM

## 2015-09-13 DIAGNOSIS — Z23 Encounter for immunization: Secondary | ICD-10-CM

## 2015-09-13 DIAGNOSIS — G529 Cranial nerve disorder, unspecified: Secondary | ICD-10-CM | POA: Diagnosis not present

## 2015-09-13 MED ORDER — GABAPENTIN 100 MG PO CAPS: 100.0000 mg | ORAL_CAPSULE | Freq: Three times a day (TID) | ORAL | Status: DC

## 2015-09-13 MED ORDER — SUMATRIPTAN SUCCINATE 50 MG PO TABS: ORAL_TABLET | ORAL | Status: DC

## 2015-09-13 NOTE — Patient Instructions (Signed)
Chronic daily headaches represent  a conversion of migraines into a headache which recurs repeatedly every day .These are almost always  due to the use of excess nonsteroidals such as ibuprofen or naproxen and or caffeine. The nonsteroidals and caffeine should be weaned as quickly as possible; but they should not be "cold turkey" going from a high dose to none. 

## 2015-09-13 NOTE — Progress Notes (Signed)
Pre visit review using our clinic review tool, if applicable. No additional management support is needed unless otherwise documented below in the visit note. 

## 2015-09-13 NOTE — Progress Notes (Signed)
   Subjective:    Patient ID: Christine [name], female    DOB: 09/27/1951, 64 y.o.   MRN: [id number]  HPI Her headaches began as sharp pain above and behind the right eye. This would last hours. If she touches the area the pain would radiate over the right crown. This can be associated with severe nausea and dry heaves. She's also noted some loose stools. With the headache she's had some dizziness.  She been taking Advil up to 6 a day on average for the headaches.  She felt that this has been associated with some " migraine" as pain in both frontal sinus areas as well as the maxillary sinus areas and the right jaw.She went to the urgent care 9/26 and was given Imitrex and Decadron with dramatic response of the "migraine".  There is no prodrome or aura with either headache.  She's had headaches intermittently since she fractured her nose 3 years ago. That was in a mechanical fall without a neurocardiac prodrome.  She is a past medical history of migraines which were in either temple. Those worse associated with slight nausea.  Review of Systems She does have occasional slight blurred vision. She may have some light sensitivity. She denies diplopia or loss of vision. She's had occasional tearing of the eyes. There is no sensitivity to sound. She denies tinnitus or hearing loss. There's been no associated vertigo and imbalance except for the slight dizziness. She has no weakness, numbness, tingling in extremities. There's been no nasal purulence, fever, chills, sweats. There has been no change in color or temperature of the skin in the area of the headaches.    Objective:   Physical Exam  Pertinent or positive findings include: She has some ptosis bilaterally. Cranial nerve exam is normal. Romberg and finger-nose testing is negative.  General appearance :adequately nourished; in no distress.  Eyes: No conjunctival inflammation or scleral icterus is present.EOM & FOV WNL.  Oral exam:  Lips  and gums are healthy appearing.There is no oropharyngeal erythema or exudate noted. Dental hygiene is good.  Heart:  Normal rate and regular rhythm. S1 and S2 normal without gallop, murmur, click, rub or other extra sounds    Lungs:Chest clear to auscultation; no wheezes, rhonchi,rales ,or rubs present.No increased work of breathing.    Vascular : all pulses equal ; no bruits present.  Skin:Warm & dry.  Intact without suspicious lesions or rashes ; no tenting or jaundice   Lymphatic: No lymphadenopathy is noted about the head, neck, axilla.   Neuro: Strength, tone & DTRs normal.     Assessment & Plan:  #1 cranial neuralgia  #2 "migraine variant" which most likely is become a chronic daily headache.  #3 loose stools, self-limited  Plan: Gabapentin will be initiated for the neuralgia and Imitrex for the migraine variant.

## 2015-09-24 ENCOUNTER — Ambulatory Visit (INDEPENDENT_AMBULATORY_CARE_PROVIDER_SITE_OTHER): Payer: Medicare Other | Admitting: Endocrinology

## 2015-09-24 ENCOUNTER — Encounter: Payer: Self-pay | Admitting: Endocrinology

## 2015-09-24 VITALS — BP 142/92 | HR 97 | Temp 98.7°F | Ht 66.0 in | Wt 191.0 lb

## 2015-09-24 DIAGNOSIS — E1165 Type 2 diabetes mellitus with hyperglycemia: Secondary | ICD-10-CM | POA: Diagnosis not present

## 2015-09-24 DIAGNOSIS — Z794 Long term (current) use of insulin: Secondary | ICD-10-CM | POA: Diagnosis not present

## 2015-09-24 DIAGNOSIS — IMO0001 Reserved for inherently not codable concepts without codable children: Secondary | ICD-10-CM

## 2015-09-24 NOTE — Progress Notes (Signed)
Subjective:    Patient ID: Christine [name], female    DOB: 05-22-1951, 64 y.o.   MRN: [id number]  HPI Pt returns for f/u of diabetes mellitus: DM type: Insulin-requiring type 2 Dx'ed: 2637 Complications: TIA Therapy: insulin GDM: never DKA: never Severe hypoglycemia: never. Pancreatitis: never Other: She wants to take insulin just QD.  pioglitizone is for NASH. Interval history: She took oral decadron only on 10/09/15.  She messaged [place] with severe hyperglycemia, and we increased the lantus to 50 units qd.  She has since reduce the lantus to 35 units qd.  As of 4 days ago, cbg's have improved to the mid-100's in am.   Past Medical History  Diagnosis Date  . Allergic rhinitis   . Sleep disorder     [name] May  . Diabetes mellitus   . Migraine headache     quiescent  . TIA (transient ischemic attack)   . Hyperlipidemia   . HTN (hypertension)   . History of recurrent UTIs     [name]    Past Surgical History  Procedure Laterality Date  . Polypectomy  2002    benign, hyperplastic polyp; rectal bleeding 2008  . Total abdominal hysterectomy  1983    BSO for endometriosis  . Coronary angioplasty  1997    for chest pain- negative   . Tonsillectomy    . Wisdom tooth extraction    . Colonoscopy      Dr Carlean [name]; hemorrhoids    Social History   Social History  . Marital Status: Married    Spouse Name: N/A  . Number of Children: N/A  . Years of Education: N/A   Occupational History  . Data Compensation Specialist     Social History Main Topics  . Smoking status: Never Smoker   . Smokeless tobacco: Not on file  . Alcohol Use: No  . Drug Use: No  . Sexual Activity: Not on file   Other Topics Concern  . Not on file   Social History Narrative   Regular exercise- no     Current Outpatient Prescriptions on File Prior to Visit  Medication Sig Dispense Refill  . aspirin 81 MG tablet Take 81 mg by mouth daily.      [name] atorvastatin (LIPITOR) 20 MG tablet Take 1  tablet (20 mg total) by mouth daily. 90 tablet 1  . baclofen (LIORESAL) 10 MG tablet TAKE 1 TABLET (10 MG TOTAL) BY MOUTH 3 (THREE) TIMES DAILY. 90 tablet 1  . busPIRone (BUSPAR) 15 MG tablet Take 15 mg by mouth daily.     . Cholecalciferol (VITAMIN D3) 5000 UNITS CAPS Take 5,000 Units by mouth daily.     [name] desvenlafaxine (PRISTIQ) 100 MG 24 hr tablet Take 100 mg by mouth daily.    . fish oil-omega-3 fatty acids 1000 MG capsule Take 1 g by mouth daily.     . fluticasone (FLONASE) 50 MCG/ACT nasal spray Place 1 spray into both nostrils 2 (two) times daily. 16 g 6  . gabapentin (NEURONTIN) 100 MG capsule Take 1 capsule (100 mg total) by mouth 3 (three) times daily. 30 capsule 2  . glucose blood (ONE TOUCH ULTRA TEST) test strip USE 1 STRIP TWICE DAILY Dx Code E11.65 100 each 2  . HYDROcodone-acetaminophen (NORCO) 10-325 MG per tablet Take 1 tablet by mouth every 8 (eight) hours as needed. 30 tablet 0  . Insulin Glargine (LANTUS) 100 UNIT/ML Solostar Pen Inject 35 Units into the skin every morning. And  pen needles 1/day 250.01    . Lancets (ONETOUCH ULTRASOFT) lancets Check blood sugar once daily. Dx code: 250.00 100 each 12  . LORazepam (ATIVAN) 0.5 MG tablet Take 0.5 mg by mouth 2 (two) times daily as needed. For anxiety.    [name] losartan (COZAAR) 100 MG tablet TAKE 1 TABLET EVERY DAY 90 tablet 1  . ondansetron (ZOFRAN) 4 MG tablet Take 1-2 tablets (4-8 mg total) by mouth every 8 (eight) hours as needed for nausea or vomiting. 20 tablet 0  . pioglitazone (ACTOS) 30 MG tablet Take 1 tablet (30 mg total) by mouth daily. 30 tablet 5  . SUMAtriptan (IMITREX) 50 MG tablet May repeat in 2 hours if headache persists or recurs. 10 tablet 0  . traMADol (ULTRAM) 50 MG tablet TAKE 1 TABLET BY MOUTH EVERY 12 HOURS 60 tablet 1  . vitamin B-12 (CYANOCOBALAMIN) 250 MCG tablet Take 250 mcg by mouth daily.       No current facility-administered medications on file prior to visit.    Allergies  Allergen Reactions    . Vioxx [Rofecoxib]     Excess BP which caused TIA  . Prednisone     REACTION:  Mental status changes No associated rash or fever  . Colesevelam     REACTION: Leg Pain    Family History  Problem Relation Age of Onset  . Colon cancer Maternal Aunt   . Diabetes Paternal Uncle   . Heart attack Father 67  . COPD Mother   . Lung cancer Brother     smoker  . Mental illness      niece, committed suicide.  [name] Heart attack      brother X 2; @ 79 & 72  . Stroke Neg Hx     BP 142/92 mmHg  Pulse 97  Temp(Src) 98.7 F (37.1 C) (Oral)  Ht 5\' 6"  (1.676 m)  Wt 191 lb (86.637 kg)  BMI 30.84 kg/m2  SpO2 94%  LMP  (LMP Unknown)  Review of Systems She denies hypoglycemia    Objective:   Physical Exam VITAL SIGNS:  See vs page GENERAL: no distress SKIN:  Insulin injection sites at the anterior abdomen are normal.     Lab Results  Component Value Date   HGBA1C 6.0 09/03/2015      Assessment & Plan:  DM: apparently well-controlled, after the effects of the latus have receded.   Patient is advised the following: Patient Instructions  check your blood sugar twice a day.  vary the time of day when you check, between before the 3 meals, and at bedtime.  also check if you have symptoms of your blood sugar being too high or too low.  please keep a record of the readings and bring it to your next appointment here.  You can write it on any piece of paper.  please call us sooner if your blood sugar goes below 70, or if you have a lot of readings over 200.   Please continue the lantus, 35 units each morning.   On this type of insulin schedule, you should eat meals on a regular schedule.  If a meal is missed or significantly delayed, your blood sugar could go low.  Please come back for a follow-up appointment as we discussed.    Please call sooner if you want to do the "fructosamine" blood test.  (like the a1c, but only goes a few weeks back).

## 2015-09-24 NOTE — Patient Instructions (Addendum)
check your blood sugar twice a day.  vary the time of day when you check, between before the 3 meals, and at bedtime.  also check if you have symptoms of your blood sugar being too high or too low.  please keep a record of the readings and bring it to your next appointment here.  You can write it on any piece of paper.  please call us sooner if your blood sugar goes below 70, or if you have a lot of readings over 200.   Please continue the lantus, 35 units each morning.   On this type of insulin schedule, you should eat meals on a regular schedule.  If a meal is missed or significantly delayed, your blood sugar could go low.  Please come back for a follow-up appointment as we discussed.    Please call sooner if you want to do the "fructosamine" blood test.  (like the a1c, but only goes a few weeks back).

## 2015-10-01 ENCOUNTER — Ambulatory Visit (INDEPENDENT_AMBULATORY_CARE_PROVIDER_SITE_OTHER): Payer: Medicare Other | Admitting: Family Medicine

## 2015-10-01 ENCOUNTER — Ambulatory Visit: Payer: Medicare Other | Admitting: Family Medicine

## 2015-10-01 ENCOUNTER — Encounter: Payer: Self-pay | Admitting: Family Medicine

## 2015-10-01 VITALS — BP 138/82 | HR 84 | Ht 66.0 in | Wt 191.0 lb

## 2015-10-01 DIAGNOSIS — M9902 Segmental and somatic dysfunction of thoracic region: Secondary | ICD-10-CM

## 2015-10-01 DIAGNOSIS — M9908 Segmental and somatic dysfunction of rib cage: Secondary | ICD-10-CM | POA: Diagnosis not present

## 2015-10-01 DIAGNOSIS — M9901 Segmental and somatic dysfunction of cervical region: Secondary | ICD-10-CM

## 2015-10-01 DIAGNOSIS — M999 Biomechanical lesion, unspecified: Secondary | ICD-10-CM

## 2015-10-01 DIAGNOSIS — M542 Cervicalgia: Secondary | ICD-10-CM

## 2015-10-01 NOTE — Progress Notes (Signed)
Pre visit review using our clinic review tool, if applicable. No additional management support is needed unless otherwise documented below in the visit note. 

## 2015-10-01 NOTE — Progress Notes (Signed)
  [name] [address] [place], [place] [address] Phone: [phone] Subjective:    CC: Neck and upper back pain follow-up  [id number] Christine [name] is a 64 y.o. female coming in for follow-up of her neck pain. Patient has had some difficulty with headaches recently saw has not been doing her exercises on a regular basis. Patient was started on a new medication for a cranial neuralgia. Patient was to start gabapentin and has not had any big side effects. Patient states mild improvement with the gabapentin. Patient is concerned that this could be or allergies as well. Stopped doing her steroid nose spray multiple months ago.  Hip pain seems to be doing somewhat better. Very slowly improving on the right side. Patient did have some lumbar radiculopathy previously but states that her back pain seems to be relatively stable.  Past medical history, social, surgical and family history all reviewed in electronic medical record.   Review of Systems: No headache, visual changes, nausea, vomiting, diarrhea, constipation, dizziness, abdominal pain, skin rash, fevers, chills, night sweats, weight loss, swollen lymph nodes, body aches, joint swelling, muscle aches, chest pain, shortness of breath, mood changes.   Objective Blood pressure 138/82, pulse 84, height 5\' 6"  (1.676 m), weight 191 lb (86.637 kg), SpO2 96 %.  General: No apparent distress alert and oriented x3 mood and affect normal, dressed appropriately.  HEENT: Pupils equal, extraocular movements intact  Respiratory: Patient's speak in full sentences and does not appear short of breath  Cardiovascular: No lower extremity edema, non tender, no erythema  Skin:  No rash noted in the skin today. But not to the exam. No masses palpated. Neurovascularly intact. Abdomen: Soft nontender  No organomegaly Neuro: Cranial nerves II through XII are intact, neurovascularly intact in all extremities with 2+ DTRs and 2+  pulses.  Lymph: No lymphadenopathy of posterior or anterior cervical chain or axillae bilaterally.  Gait normal with good balance and coordination.  MSK:  Non tender with full range of motion and good stability and symmetric strength and tone of  elbows, wrist, hip, knee and ankles bilaterally.  Right hip shows the patient is tender to palpation over the greater trochanteric area. Neck: Inspection unremarkable. No palpable stepoffs. Negative Spurling's maneuver. Full neck range of motion tightness of the paraspinal musculature around the trapezius as well as the rhomboids from previous Grip strength and sensation normal in bilateral hands Strength good C4 to T1 distribution No sensory change to C4 to T1 Negative Hoffman sign bilaterally Reflexes normal    OMT Physical Exam  Cervical  C2 flexed rotated and side bent right C6 flexed rotated and side bent left  Thoracic T1 extended rotated and side bent right with elevated first rib T3 extended rotated and side bent left inhale rib 3rd  T5 extended rotated and side bent right   lumbar L2 flexed rotated and side bent right  Sacrum left on left Same pattern as previously   Impression and Recommendations:     This case required medical decision making of moderate complexity.

## 2015-10-01 NOTE — Assessment & Plan Note (Signed)
Patient did have some increasing tenderness of the musculature around the neck today. I do think that there is some stress involved as well as this could be secondary to some increasing Lorenz Coaster and possibly allergies. Patient will try to do her other medications and please see patient instructions. We discussed icing regimen as well as doing the postural control exercises on a more regular basis. Patient and will come back and see me again in 4 weeks for further evaluation and treatment.

## 2015-10-01 NOTE — Patient Instructions (Addendum)
Great to see you as always.  Ice is your friend when you need it.  Flonase daily for 2 weeks is a good idea continue the gabapentin for now.  Conitnue to do what you do! See me again in 4-6 weeks

## 2015-10-01 NOTE — Assessment & Plan Note (Signed)
Decision today to treat with OMT was based on Physical Exam  After verbal consent patient was treated with HVLA, ME, FPR techniques in cervical, thoracic, lumbar areas  Patient tolerated the procedure well with improvement in symptoms  Patient given exercises, stretches and lifestyle modifications  See medications in patient instructions if given  Patient will follow up in 4-6 weeks 

## 2015-10-09 ENCOUNTER — Other Ambulatory Visit: Payer: Self-pay | Admitting: Internal Medicine

## 2015-10-29 ENCOUNTER — Encounter: Payer: Self-pay | Admitting: Family Medicine

## 2015-10-29 ENCOUNTER — Ambulatory Visit (INDEPENDENT_AMBULATORY_CARE_PROVIDER_SITE_OTHER): Payer: Medicare Other | Admitting: Family Medicine

## 2015-10-29 VITALS — BP 142/82 | HR 81 | Ht 66.0 in | Wt 193.0 lb

## 2015-10-29 DIAGNOSIS — M9901 Segmental and somatic dysfunction of cervical region: Secondary | ICD-10-CM

## 2015-10-29 DIAGNOSIS — M542 Cervicalgia: Secondary | ICD-10-CM

## 2015-10-29 DIAGNOSIS — M999 Biomechanical lesion, unspecified: Secondary | ICD-10-CM

## 2015-10-29 DIAGNOSIS — M9908 Segmental and somatic dysfunction of rib cage: Secondary | ICD-10-CM | POA: Diagnosis not present

## 2015-10-29 DIAGNOSIS — M9902 Segmental and somatic dysfunction of thoracic region: Secondary | ICD-10-CM | POA: Diagnosis not present

## 2015-10-29 NOTE — Assessment & Plan Note (Signed)
Relatively well controlled at this time. I do think patient is going to have some increasing stress as well as with her travel coming up for the holidays. We discussed with patient to try to do the exercises on a more regular basis. Patient will continue to remain active. Patient come back and see me again in 3-4 weeks.

## 2015-10-29 NOTE — Progress Notes (Signed)
  [name] [place], [place] [address] Phone: [phone] Subjective:    CC: Neck and upper back pain follow-up  [id number] [name] is a 64 y.o. female coming in for follow-up of her neck pain. Patient has had some difficulty with headaches recently saw has not been doing her exercises on a regular basis. Patient has not had any significant pain is stopped her from any activity. Was having some mild increase in headaches that she does correspond more to the weather.  Patient continues to have the trigeminal neuralgia. On gabapentin. Has not notice any significant improvement at this time.    Past medical history, social, surgical and family history all reviewed in electronic medical record.   Review of Systems: No headache, visual changes, nausea, vomiting, diarrhea, constipation, dizziness, abdominal pain, skin rash, fevers, chills, night sweats, weight loss, swollen lymph nodes, body aches, joint swelling, muscle aches, chest pain, shortness of breath, mood changes.   Objective Blood pressure 142/82, pulse 81, height 5\' 6"  (1.676 m), weight 193 lb (87.544 kg), SpO2 96 %.  General: No apparent distress alert and oriented x3 mood and affect normal, dressed appropriately.  HEENT: Pupils equal, extraocular movements intact  Respiratory: Patient's speak in full sentences and does not appear short of breath  Cardiovascular: No lower extremity edema, non tender, no erythema  Skin:  No rash noted in the skin today. But not to the exam. No masses palpated. Neurovascularly intact. Abdomen: Soft nontender  No organomegaly Neuro: Cranial nerves II through XII are intact, neurovascularly intact in all extremities with 2+ DTRs and 2+ pulses.  Lymph: No lymphadenopathy of posterior or anterior cervical chain or axillae bilaterally.  Gait normal with good balance and coordination.  MSK:  Non tender with full range of motion and good stability  and symmetric strength and tone of  elbows, wrist, hip, knee and ankles bilaterally.  Right hip shows the patient is tender to palpation over the greater trochanteric area. Neck: Inspection unremarkable. No palpable stepoffs. Negative Spurling's maneuver. Full neck range of motion tightness only tenderness on the right. Only TTP in spinal musculature in the trapezius Grip strength and sensation normal in bilateral hands Strength good C4 to T1 distribution No sensory change to C4 to T1 Negative Hoffman sign bilaterally Reflexes normal    OMT Physical Exam  Cervical  C2 flexed rotated and side bent right C4 flexed rotated and side bent left  Thoracic T1 extended rotated and side bent right with elevated first rib T3 extended rotated and side bent left  T5 extended rotated and side bent right   lumbar L2 flexed rotated and side bent right  Sacrum left on left   Impression and Recommendations:     This case required medical decision making of moderate complexity.

## 2015-10-29 NOTE — Assessment & Plan Note (Signed)
Decision today to treat with OMT was based on Physical Exam  After verbal consent patient was treated with HVLA, ME, FPR techniques in cervical, thoracic, lumbar areas  Patient tolerated the procedure well with improvement in symptoms  Patient given exercises, stretches and lifestyle modifications  See medications in patient instructions if given  Patient will follow up in 4-6 weeks 

## 2015-10-29 NOTE — Progress Notes (Signed)
Pre visit review using our clinic review tool, if applicable. No additional management support is needed unless otherwise documented below in the visit note. 

## 2015-10-29 NOTE — Patient Instructions (Addendum)
Good to see you Happy holidays! Stay active Try the pennsaid on the face a little bit.  See me again in 4-6 weeks. Before [place] trip

## 2015-11-02 ENCOUNTER — Other Ambulatory Visit: Payer: Self-pay | Admitting: Endocrinology

## 2015-11-14 ENCOUNTER — Other Ambulatory Visit: Payer: Self-pay | Admitting: Endocrinology

## 2015-11-25 ENCOUNTER — Encounter: Payer: Self-pay | Admitting: Internal Medicine

## 2015-11-25 ENCOUNTER — Other Ambulatory Visit (INDEPENDENT_AMBULATORY_CARE_PROVIDER_SITE_OTHER): Payer: Medicare Other

## 2015-11-25 ENCOUNTER — Ambulatory Visit (INDEPENDENT_AMBULATORY_CARE_PROVIDER_SITE_OTHER): Payer: Medicare Other | Admitting: Internal Medicine

## 2015-11-25 VITALS — BP 138/78 | HR 84 | Temp 98.2°F | Resp 12 | Ht 66.0 in | Wt 191.0 lb

## 2015-11-25 DIAGNOSIS — Z0000 Encounter for general adult medical examination without abnormal findings: Secondary | ICD-10-CM | POA: Insufficient documentation

## 2015-11-25 DIAGNOSIS — Z794 Long term (current) use of insulin: Secondary | ICD-10-CM

## 2015-11-25 DIAGNOSIS — E119 Type 2 diabetes mellitus without complications: Secondary | ICD-10-CM | POA: Diagnosis not present

## 2015-11-25 LAB — COMPREHENSIVE METABOLIC PANEL
ALK PHOS: 62 U/L (ref 39–117)
ALT: 14 U/L (ref 0–35)
AST: 16 U/L (ref 0–37)
Albumin: 4.2 g/dL (ref 3.5–5.2)
BILIRUBIN TOTAL: 0.4 mg/dL (ref 0.2–1.2)
BUN: 14 mg/dL (ref 6–23)
CALCIUM: 9.4 mg/dL (ref 8.4–10.5)
CO2: 30 meq/L (ref 19–32)
Chloride: 104 mEq/L (ref 96–112)
Creatinine, Ser: 0.67 mg/dL (ref 0.40–1.20)
GFR: 93.46 mL/min (ref >60.00)
Glucose, Bld: 121 mg/dL — ABNORMAL HIGH (ref 70–99)
POTASSIUM: 3.7 meq/L (ref 3.5–5.1)
Sodium: 141 mEq/L (ref 135–145)
Total Protein: 6.9 g/dL (ref 6.0–8.3)

## 2015-11-25 LAB — LIPID PANEL
CHOL/HDL RATIO: 4
Cholesterol: 177 mg/dL (ref 0–200)
HDL: 49.3 mg/dL (ref >39.00)
LDL Cholesterol: 105 mg/dL — ABNORMAL HIGH (ref 0–99)
NonHDL: 128.07
TRIGLYCERIDES: 113 mg/dL (ref 0.0–149.0)
VLDL: 22.6 mg/dL (ref 0.0–40.0)

## 2015-11-25 MED ORDER — OMEPRAZOLE 20 MG PO CPDR: 20.0000 mg | DELAYED_RELEASE_CAPSULE | Freq: Every day | ORAL | Status: DC

## 2015-11-25 NOTE — Progress Notes (Signed)
   Subjective:    Patient ID: Christine [name], female    DOB: [birth year], 64 y.o.   MRN: [id number]  HPI Here for medicare wellness, no new complaints. Please see A/P for status and treatment of chronic medical problems.   Diet: DM since diabetic Physical activity: sedentary Depression/mood screen: negative Hearing: intact to whispered voice Visual acuity: grossly normal, performs annual eye exam  ADLs: capable Fall risk: none Home safety: good Cognitive evaluation: intact to orientation, naming, recall and repetition EOL planning: adv directives discussed, in place  I have personally reviewed and have noted 1. The patient's medical and social history - reviewed today no changes 2. Their use of alcohol, tobacco or illicit drugs 3. Their current medications and supplements 4. The patient's functional ability including ADL's, fall risks, home safety risks and hearing or visual impairment. 5. Diet and physical activities 6. Evidence for depression or mood disorders 7. Care team reviewed and updated (available in snapshot)  Review of Systems  Constitutional: Negative for fever, activity change, appetite change, fatigue and unexpected weight change.  HENT: Negative.   Eyes: Negative.   Respiratory: Negative for cough, chest tightness, shortness of breath and wheezing.   Cardiovascular: Negative for chest pain, palpitations and leg swelling.  Gastrointestinal: Negative for nausea, abdominal pain, diarrhea, constipation and abdominal distention.  Musculoskeletal: Positive for arthralgias. Negative for myalgias, back pain and joint swelling.  Skin: Negative.   Neurological: Negative.   Psychiatric/Behavioral: Negative.       Objective:   Physical Exam  Constitutional: She is oriented to person, place, and time. She appears well-developed and well-nourished.  HENT:  Head: Normocephalic and atraumatic.  Eyes: EOM are normal.  Neck: Normal range of motion.  Cardiovascular: Normal  rate and regular rhythm.   Pulmonary/Chest: Effort normal and breath sounds normal. No respiratory distress. She has no wheezes. She has no rales.  Abdominal: Soft. Bowel sounds are normal. She exhibits no distension. There is no tenderness. There is no rebound.  Musculoskeletal: She exhibits no edema.  Neurological: She is alert and oriented to person, place, and time. Coordination normal.  Skin: Skin is warm and dry.  Foot exam wnl  Psychiatric: She has a normal mood and affect.   Filed Vitals:   11/25/15 0831  BP: 156/80  Pulse: 84  Temp: 98.2 F (36.8 C)  TempSrc: Oral  Resp: 12  Height: 5\' 6"  (1.676 m)  Weight: 191 lb (86.637 kg)  SpO2: 94%   EKG: rate    Assessment & Plan:

## 2015-11-25 NOTE — Progress Notes (Signed)
Pre visit review using our clinic review tool, if applicable. No additional management support is needed unless otherwise documented below in the visit note. 

## 2015-11-25 NOTE — Assessment & Plan Note (Signed)
BP normal, checking labs. Counseled about sun exposure and protection to decrease risk of skin cancers. Given 10 year screening recommendations at visit.

## 2015-11-25 NOTE — Patient Instructions (Signed)
We have checked the EKG which is the same as the last one in 2013.   We will check the labs today and call you back with the results.   Think about trying to find some exercise that doesn't bother your hip (maybe water aerobics).  Health Maintenance, Female Adopting a healthy lifestyle and getting preventive care can go a long way to promote health and wellness. Talk with your health care provider about what schedule of regular examinations is right for you. This is a good chance for you to check in with your provider about disease prevention and staying healthy. In between checkups, there are plenty of things you can do on your own. Experts have done a lot of research about which lifestyle changes and preventive measures are most likely to keep you healthy. Ask your health care provider for more information. WEIGHT AND DIET  Eat a healthy diet  Be sure to include plenty of vegetables, fruits, low-fat dairy products, and lean protein.  Do not eat a lot of foods high in solid fats, added sugars, or salt.  Get regular exercise. This is one of the most important things you can do for your health.  Most adults should exercise for at least 150 minutes each week. The exercise should increase your heart rate and make you sweat (moderate-intensity exercise).  Most adults should also do strengthening exercises at least twice a week. This is in addition to the moderate-intensity exercise.  Maintain a healthy weight  Body mass index (BMI) is a measurement that can be used to identify possible weight problems. It estimates body fat based on height and weight. Your health care provider can help determine your BMI and help you achieve or maintain a healthy weight.  For females 58 years of age and older:   A BMI below 18.5 is considered underweight.  A BMI of 18.5 to 24.9 is normal.  A BMI of 25 to 29.9 is considered overweight.  A BMI of 30 and above is considered obese.  Watch levels of  cholesterol and blood lipids  You should start having your blood tested for lipids and cholesterol at 66 years of age, then have this test every 5 years.  You may need to have your cholesterol levels checked more often if:  Your lipid or cholesterol levels are high.  You are older than 66 years of age.  You are at high risk for heart disease.  CANCER SCREENING   Lung Cancer  Lung cancer screening is recommended for adults 63-77 years old who are at high risk for lung cancer because of a history of smoking.  A yearly low-dose CT scan of the lungs is recommended for people who:  Currently smoke.  Have quit within the past 15 years.  Have at least a 30-pack-year history of smoking. A pack year is smoking an average of one pack of cigarettes a day for 1 year.  Yearly screening should continue until it has been 15 years since you quit.  Yearly screening should stop if you develop a health problem that would prevent you from having lung cancer treatment.  Breast Cancer  Practice breast self-awareness. This means understanding how your breasts normally appear and feel.  It also means doing regular breast self-exams. Let your health care provider know about any changes, no matter how small.  If you are in your 20s or 30s, you should have a clinical breast exam (CBE) by a health care provider every 1-3 years as part  of a regular health exam.  If you are 77 or older, have a CBE every year. Also consider having a breast X-ray (mammogram) every year.  If you have a family history of breast cancer, talk to your health care provider about genetic screening.  If you are at high risk for breast cancer, talk to your health care provider about having an MRI and a mammogram every year.  Breast cancer gene (BRCA) assessment is recommended for women who have family members with BRCA-related cancers. BRCA-related cancers include:  Breast.  Ovarian.  Tubal.  Peritoneal  cancers.  Results of the assessment will determine the need for genetic counseling and BRCA1 and BRCA2 testing. Cervical Cancer Your health care provider may recommend that you be screened regularly for cancer of the pelvic organs (ovaries, uterus, and vagina). This screening involves a pelvic examination, including checking for microscopic changes to the surface of your cervix (Pap test). You may be encouraged to have this screening done every 3 years, beginning at age 87.  For women ages 85-65, health care providers may recommend pelvic exams and Pap testing every 3 years, or they may recommend the Pap and pelvic exam, combined with testing for human papilloma virus (HPV), every 5 years. Some types of HPV increase your risk of cervical cancer. Testing for HPV may also be done on women of any age with unclear Pap test results.  Other health care providers may not recommend any screening for nonpregnant women who are considered low risk for pelvic cancer and who do not have symptoms. Ask your health care provider if a screening pelvic exam is right for you.  If you have had past treatment for cervical cancer or a condition that could lead to cancer, you need Pap tests and screening for cancer for at least 20 years after your treatment. If Pap tests have been discontinued, your risk factors (such as having a new sexual partner) need to be reassessed to determine if screening should resume. Some women have medical problems that increase the chance of getting cervical cancer. In these cases, your health care provider may recommend more frequent screening and Pap tests. Colorectal Cancer  This type of cancer can be detected and often prevented.  Routine colorectal cancer screening usually begins at 66 years of age and continues through 66 years of age.  Your health care provider may recommend screening at an earlier age if you have risk factors for colon cancer.  Your health care provider may also  recommend using home test kits to check for hidden blood in the stool.  A small camera at the end of a tube can be used to examine your colon directly (sigmoidoscopy or colonoscopy). This is done to check for the earliest forms of colorectal cancer.  Routine screening usually begins at age 20.  Direct examination of the colon should be repeated every 5-10 years through 66 years of age. However, you may need to be screened more often if early forms of precancerous polyps or small growths are found. Skin Cancer  Check your skin from head to toe regularly.  Tell your health care provider about any new moles or changes in moles, especially if there is a change in a mole's shape or color.  Also tell your health care provider if you have a mole that is larger than the size of a pencil eraser.  Always use sunscreen. Apply sunscreen liberally and repeatedly throughout the day.  Protect yourself by wearing long sleeves, pants, a  wide-brimmed hat, and sunglasses whenever you are outside. HEART DISEASE, DIABETES, AND HIGH BLOOD PRESSURE   High blood pressure causes heart disease and increases the risk of stroke. High blood pressure is more likely to develop in:  People who have blood pressure in the high end of the normal range (130-139/85-89 Wen Hg).  People who are overweight or obese.  People who are African American.  If you are 40-69 years of age, have your blood pressure checked every 3-5 years. If you are 39 years of age or older, have your blood pressure checked every year. You should have your blood pressure measured twice--once when you are at a hospital or clinic, and once when you are not at a hospital or clinic. Record the average of the two measurements. To check your blood pressure when you are not at a hospital or clinic, you can use:  An automated blood pressure machine at a pharmacy.  A home blood pressure monitor.  If you are between 2 years and 36 years old, ask your health  care provider if you should take aspirin to prevent strokes.  Have regular diabetes screenings. This involves taking a blood sample to check your fasting blood sugar level.  If you are at a normal weight and have a low risk for diabetes, have this test once every three years after 66 years of age.  If you are overweight and have a high risk for diabetes, consider being tested at a younger age or more often. PREVENTING INFECTION  Hepatitis B  If you have a higher risk for hepatitis B, you should be screened for this virus. You are considered at high risk for hepatitis B if:  You were born in a country where hepatitis B is common. Ask your health care provider which countries are considered high risk.  Your parents were born in a high-risk country, and you have not been immunized against hepatitis B (hepatitis B vaccine).  You have HIV or AIDS.  You use needles to inject street drugs.  You live with someone who has hepatitis B.  You have had sex with someone who has hepatitis B.  You get hemodialysis treatment.  You take certain medicines for conditions, including cancer, organ transplantation, and autoimmune conditions. Hepatitis C  Blood testing is recommended for:  Everyone born from [birth year] through 1967.  Anyone with known risk factors for hepatitis C. Sexually transmitted infections (STIs)  You should be screened for sexually transmitted infections (STIs) including gonorrhea and chlamydia if:  You are sexually active and are younger than 67 years of age.  You are older than 66 years of age and your health care provider tells you that you are at risk for this type of infection.  Your sexual activity has changed since you were last screened and you are at an increased risk for chlamydia or gonorrhea. Ask your health care provider if you are at risk.  If you do not have HIV, but are at risk, it may be recommended that you take a prescription medicine daily to prevent HIV  infection. This is called pre-exposure prophylaxis (PrEP). You are considered at risk if:  You are sexually active and do not regularly use condoms or know the HIV status of your partner(s).  You take drugs by injection.  You are sexually active with a partner who has HIV. Talk with your health care provider about whether you are at high risk of being infected with HIV. If you choose to begin PrEP,  you should first be tested for HIV. You should then be tested every 3 months for as long as you are taking PrEP.  PREGNANCY   If you are premenopausal and you may become pregnant, ask your health care provider about preconception counseling.  If you may become pregnant, take 400 to 800 micrograms (mcg) of folic acid every day.  If you want to prevent pregnancy, talk to your health care provider about birth control (contraception). OSTEOPOROSIS AND MENOPAUSE   Osteoporosis is a disease in which the bones lose minerals and strength with aging. This can result in serious bone fractures. Your risk for osteoporosis can be identified using a bone density scan.  If you are 38 years of age or older, or if you are at risk for osteoporosis and fractures, ask your health care provider if you should be screened.  Ask your health care provider whether you should take a calcium or vitamin D supplement to lower your risk for osteoporosis.  Menopause may have certain physical symptoms and risks.  Hormone replacement therapy may reduce some of these symptoms and risks. Talk to your health care provider about whether hormone replacement therapy is right for you.  HOME CARE INSTRUCTIONS   Schedule regular health, dental, and eye exams.  Stay current with your immunizations.   Do not use any tobacco products including cigarettes, chewing tobacco, or electronic cigarettes.  If you are pregnant, do not drink alcohol.  If you are breastfeeding, limit how much and how often you drink alcohol.  Limit  alcohol intake to no more than 1 drink per day for nonpregnant women. One drink equals 12 ounces of beer, 5 ounces of wine, or 1 ounces of hard liquor.  Do not use street drugs.  Do not share needles.  Ask your health care provider for help if you need support or information about quitting drugs.  Tell your health care provider if you often feel depressed.  Tell your health care provider if you have ever been abused or do not feel safe at home.   This information is not intended to replace advice given to you by your health care provider. Make sure you discuss any questions you have with your health care provider.   Document Released: 06/15/2011 Document Revised: 12/21/2014 Document Reviewed: 11/01/2013 Elsevier Interactive Patient Education [employer].

## 2015-11-26 LAB — HEPATITIS C ANTIBODY: HCV AB: NEGATIVE

## 2015-11-27 ENCOUNTER — Other Ambulatory Visit (INDEPENDENT_AMBULATORY_CARE_PROVIDER_SITE_OTHER): Payer: Medicare Other

## 2015-11-27 ENCOUNTER — Encounter: Payer: Self-pay | Admitting: Family Medicine

## 2015-11-27 ENCOUNTER — Ambulatory Visit (INDEPENDENT_AMBULATORY_CARE_PROVIDER_SITE_OTHER): Payer: Medicare Other | Admitting: Family Medicine

## 2015-11-27 VITALS — BP 120/80 | HR 89 | Ht 66.0 in | Wt 192.0 lb

## 2015-11-27 DIAGNOSIS — M9908 Segmental and somatic dysfunction of rib cage: Secondary | ICD-10-CM | POA: Diagnosis not present

## 2015-11-27 DIAGNOSIS — M7061 Trochanteric bursitis, right hip: Secondary | ICD-10-CM

## 2015-11-27 DIAGNOSIS — M25551 Pain in right hip: Secondary | ICD-10-CM | POA: Diagnosis not present

## 2015-11-27 DIAGNOSIS — M9901 Segmental and somatic dysfunction of cervical region: Secondary | ICD-10-CM

## 2015-11-27 DIAGNOSIS — M999 Biomechanical lesion, unspecified: Secondary | ICD-10-CM

## 2015-11-27 DIAGNOSIS — M542 Cervicalgia: Secondary | ICD-10-CM

## 2015-11-27 DIAGNOSIS — M9902 Segmental and somatic dysfunction of thoracic region: Secondary | ICD-10-CM

## 2015-11-27 HISTORY — DX: Trochanteric bursitis, right hip: M70.61

## 2015-11-27 MED ORDER — TRAMADOL HCL 50 MG PO TABS: 50.0000 mg | ORAL_TABLET | Freq: Two times a day (BID) | ORAL | Status: DC

## 2015-11-27 MED ORDER — MELOXICAM 7.5 MG PO TABS: 7.5000 mg | ORAL_TABLET | Freq: Every day | ORAL | Status: DC

## 2015-11-27 NOTE — Progress Notes (Signed)
Pre visit review using our clinic review tool, if applicable. No additional management support is needed unless otherwise documented below in the visit note. 

## 2015-11-27 NOTE — Assessment & Plan Note (Signed)
Seem stable overall. We discussed icing regimen and home exercises. We discussed which activities to do an which was potentially avoid patient will be traveling and likely will have some exacerbations. Patient does have home exercises. Given a very small dose of an anti-inflammatory discussed that if it hurts her stomach to discontinue immediately. Patient will continue all other medications. We will see her again in 3 weeks. Continues to respond well to osteopathic manipulation.

## 2015-11-27 NOTE — Progress Notes (Signed)
[name] [address] [place], [place] [address] Phone: [phone] Subjective:    CC: Neck and upper back pain follow-up  [id number] Christine [name] is a 64 y.o. female coming in for follow-up of her neck pain. Patient has had some difficulty with headaches recently saw has not been doing her exercises on a regular basis. Patient has not had any significant pain is stopped her from any activity. Headaches seemed that her.  Patient continues to have the trigeminal neuralgia. On gabapentin. Almost nonexistent   Patient was having more of a right-sided hip pain. Patient states that it is severely tender to palpation. Patient states getting out of a seated position for a long amount of time is very severe. Some mild radiation down the lateral aspect the leg. No weakness. States that it is affecting her daily activities. Denies any numbness. Does have a history of lower back pain. Rates the severity of pain as 9 out of 10.  Past medical history, social, surgical and family history all reviewed in electronic medical record.   Review of Systems: No headache, visual changes, nausea, vomiting, diarrhea, constipation, dizziness, abdominal pain, skin rash, fevers, chills, night sweats, weight loss, swollen lymph nodes, body aches, joint swelling, muscle aches, chest pain, shortness of breath, mood changes.   Objective Blood pressure 120/80, pulse 89, height 5\' 6"  (1.676 m), weight 192 lb (87.091 kg), SpO2 94 %.  General: No apparent distress alert and oriented x3 mood and affect normal, dressed appropriately.  HEENT: Pupils equal, extraocular movements intact  Respiratory: Patient's speak in full sentences and does not appear short of breath  Cardiovascular: No lower extremity edema, non tender, no erythema  Skin:  No rash noted in the skin today. But not to the exam. No masses palpated. Neurovascularly intact. Abdomen: Soft nontender  No organomegaly Neuro:  Cranial nerves II through XII are intact, neurovascularly intact in all extremities with 2+ DTRs and 2+ pulses.  Lymph: No lymphadenopathy of posterior or anterior cervical chain or axillae bilaterally.  Gait normal with good balance and coordination.  MSK:  Non tender with full range of motion and good stability and symmetric strength and tone of  elbows, wrist, hip, knee and ankles bilaterally.  Right hip shows the patient is tender to palpation over the greater trochanteric area. Significant more severe than previous exam.  MSK US performed of: Right This study was ordered, performed, and interpreted by Charlann Boxer D.O.  Hip: Trochanteric bursa with significant hypoechoic changes and swelling Acetabular labrum visualized and without tears, displacement, or effusion in joint. Femoral neck appears unremarkable without increased power doppler signal along Cortex.  IMPRESSION:  Greater trochanter bursitis   Procedure: Real-time Ultrasound Guided Injection of right greater trochanteric bursitis secondary to patient's body habitus Device: GE Logiq E  Ultrasound guided injection is preferred based studies that show increased duration, increased effect, greater accuracy, decreased procedural pain, increased response rate, and decreased cost with ultrasound guided versus blind injection.  Verbal informed consent obtained.  Time-out conducted.  Noted no overlying erythema, induration, or other signs of local infection.  Skin prepped in a sterile fashion.  Local anesthesia: Topical Ethyl chloride.  With sterile technique and under real time ultrasound guidance:  Greater trochanteric area was visualized and patient's bursa was noted. A 22-gauge 3 inch needle was inserted and 4 cc of 0.5% Marcaine and 1 cc of Kenalog 40 mg/dL was injected. Pictures taken Completed without difficulty  Pain immediately resolved suggesting  accurate placement of the medication.  Advised to call if fevers/chills,  erythema, induration, drainage, or persistent bleeding.  Images permanently stored and available for review in the ultrasound unit.  Impression: Technically successful ultrasound guided injection.   Neck: Inspection unremarkable. No palpable stepoffs. Negative Spurling's maneuver. Full neck range of motion tightness only tenderness on the right. Only TTP in spinal musculature in the trapezius Grip strength and sensation normal in bilateral hands Strength good C4 to T1 distribution No sensory change to C4 to T1 Negative Hoffman sign bilaterally Reflexes normal    OMT Physical Exam  Cervical  C2 flexed rotated and side bent right C4 flexed rotated and side bent left  Thoracic T1 extended rotated and side bent right with elevated first rib T3 extended rotated and side bent left  T5 extended rotated and side bent right   lumbar L2 flexed rotated and side bent right  Sacrum left on left   Impression and Recommendations:     This case required medical decision making of moderate complexity.

## 2015-11-27 NOTE — Patient Instructions (Addendum)
Great to see you Christine [name] is your friend (maybe) Stay active Try meloxicam daily for 10 days then as needed With the tramadol take 325-650mg  of tylenol each time.  Send me message if not a lot better this weekend and will get something. Otherwise have a great trip Happy holidays!

## 2015-11-27 NOTE — Assessment & Plan Note (Signed)
Patient given injection today and tolerated the procedure well. We discussed icing regimen. We discussed home exercises. Differential does include a lumbar radiculopathy which patient has had before. Patient though did respond to the injection with complete resolution of pain but did have numbness still. We discussed if any significant weakness patient needs to seek medical attention immediately. Patient will be leaving for multiple weeks starting in 1 week. Patient will call if any exacerbating factors occur. We will use a short course of anti-inflammatory as well. Patient and will come back and see me when she gets back into town.

## 2015-11-27 NOTE — Assessment & Plan Note (Signed)
Decision today to treat with OMT was based on Physical Exam  After verbal consent patient was treated with HVLA, ME, FPR techniques in cervical, thoracic, lumbar areas  Patient tolerated the procedure well with improvement in symptoms  Patient given exercises, stretches and lifestyle modifications  See medications in patient instructions if given  Patient will follow up in 4-6 weeks 

## 2015-12-01 ENCOUNTER — Encounter: Payer: Self-pay | Admitting: Family Medicine

## 2015-12-23 DIAGNOSIS — R457 State of emotional shock and stress, unspecified: Secondary | ICD-10-CM | POA: Diagnosis not present

## 2015-12-25 ENCOUNTER — Encounter: Payer: Self-pay | Admitting: Family Medicine

## 2015-12-25 ENCOUNTER — Other Ambulatory Visit (INDEPENDENT_AMBULATORY_CARE_PROVIDER_SITE_OTHER): Payer: Medicare Other

## 2015-12-25 ENCOUNTER — Ambulatory Visit (INDEPENDENT_AMBULATORY_CARE_PROVIDER_SITE_OTHER): Payer: Medicare Other | Admitting: Family Medicine

## 2015-12-25 VITALS — BP 130/86 | HR 80 | Ht 66.0 in | Wt 192.0 lb

## 2015-12-25 DIAGNOSIS — M999 Biomechanical lesion, unspecified: Secondary | ICD-10-CM

## 2015-12-25 DIAGNOSIS — R51 Headache: Secondary | ICD-10-CM | POA: Diagnosis not present

## 2015-12-25 DIAGNOSIS — R519 Headache, unspecified: Secondary | ICD-10-CM

## 2015-12-25 DIAGNOSIS — M9904 Segmental and somatic dysfunction of sacral region: Secondary | ICD-10-CM

## 2015-12-25 DIAGNOSIS — M9902 Segmental and somatic dysfunction of thoracic region: Secondary | ICD-10-CM

## 2015-12-25 DIAGNOSIS — M7061 Trochanteric bursitis, right hip: Secondary | ICD-10-CM

## 2015-12-25 DIAGNOSIS — M9903 Segmental and somatic dysfunction of lumbar region: Secondary | ICD-10-CM

## 2015-12-25 DIAGNOSIS — G43809 Other migraine, not intractable, without status migrainosus: Secondary | ICD-10-CM | POA: Diagnosis not present

## 2015-12-25 DIAGNOSIS — G43109 Migraine with aura, not intractable, without status migrainosus: Secondary | ICD-10-CM | POA: Diagnosis not present

## 2015-12-25 LAB — C-REACTIVE PROTEIN

## 2015-12-25 LAB — SEDIMENTATION RATE: SED RATE: 35 mm/h — AB (ref 0–22)

## 2015-12-25 MED ORDER — PROMETHAZINE HCL 12.5 MG PO TABS: 12.5000 mg | ORAL_TABLET | Freq: Three times a day (TID) | ORAL | Status: DC | PRN

## 2015-12-25 NOTE — Assessment & Plan Note (Signed)
Decision today to treat with OMT was based on Physical Exam  After verbal consent patient was treated with HVLA, ME, FPR techniques in rib, thoracic, lumbar areas  Patient tolerated the procedure well with improvement in symptoms  Patient given exercises, stretches and lifestyle modifications  See medications in patient instructions if given  Patient will follow up in 4-6 weeks

## 2015-12-25 NOTE — Assessment & Plan Note (Signed)
I do believe the patient is having more of a migraine component. Patient's only concern to be having potentially trigeminal neuralgia given her some more of the mild facial droop with patient having no significant weakness. We discussed further workup including ESR and CRP to rule out any temporal arteritis. I'm hoping that this is not likely. Does have a history of a transient ischemic attacks. Patient given the option about going over to the hospital which patient declined. Patient knows though if any significant weakness and numbness occurs in any of the extremities or face or has difficulty with speech that she will seek medical attention immediately. We discussed the possibility of treating with prednisone for the trigeminal neuralgia but secondary to patient's diabetes she is concerned. We will get labs and then treat appropriately. Patient then will come back and see me again in 2 weeks. Referred to neurology as well for further evaluation and workup.

## 2015-12-25 NOTE — Patient Instructions (Signed)
Good to see you Phenergan at home but will make you sleepy but will help with the nausea and headache We will get labs Referral to neurology for migraines Keep staying active but if any visual changes, weakness or trouble with speech please seek medical attention immediately.  See me again in 3 -4 weeks.

## 2015-12-25 NOTE — Progress Notes (Signed)
Pre visit review using our clinic review tool, if applicable. No additional management support is needed unless otherwise documented below in the visit note. 

## 2015-12-25 NOTE — Progress Notes (Signed)
  [name] [place],  [address] Phone: [phone] Subjective:    CC: Neck and upper back pain follow-up  [id number] Christine [name] is a 65 y.o. female coming in for follow-up of her neck pain. Patient has had some difficulty with headaches recently saw has not been doing her exercises on a regular basis. Patient is having what she calls a migraine. An states that every time she does that she gets pain on the lateral aspect of her head. Seems to get worse over the course of time. Does respond somewhat to medications that she was given at a urgent care facility. Has not been further evaluated for it. No visual changes. No weakness but does notice that sometimes her face has some asymmetry with this. Does have history of transient ischemic attacks but denies any worsening symptoms.  Also has a history of trigeminal neuralgia   Patient was having more of a right-sided hip pain. 10 have a greater trochanteric bursitis. Was given an injection. Tolerated the procedure very well. States that it is 85-90% better. Able to do all daily activities.  Past medical history, social, surgical and family history all reviewed in electronic medical record.   Review of Systems: No headache, visual changes, nausea, vomiting, diarrhea, constipation, dizziness, abdominal pain, skin rash, fevers, chills, night sweats, weight loss, swollen lymph nodes, body aches, joint swelling, muscle aches, chest pain, shortness of breath, mood changes.   Objective Blood pressure 130/86, pulse 80, height 5\' 6"  (1.676 m), weight 192 lb (87.091 kg), SpO2 96 %.  General: No apparent distress alert and oriented x3 mood and affect normal, dressed appropriately.  HEENT: Pupils equal, extraocular movements intact patient has some mild facial droop on the right side. Patient though does have full strength noted with testing. Deep tendon reflexes are intact in all  extremities. Respiratory: Patient's speak in full sentences and does not appear short of breath  Cardiovascular: No lower extremity edema, non tender, no erythema  Skin:  No rash noted in the skin today. But not to the exam. No masses palpated. Neurovascularly intact. Abdomen: Soft nontender  No organomegaly Neuro: Cranial nerves II through XII are intact, neurovascularly intact in all extremities with 2+ DTRs and 2+ pulses.  Lymph: No lymphadenopathy of posterior or anterior cervical chain or axillae bilaterally.  Gait normal with good balance and coordination.  MSK:  Non tender with full range of motion and good stability and symmetric strength and tone of  elbows, wrist, hip, knee and ankles bilaterally.  Right hip shows the patient is mainly tender in the area with significant improvement from previous exam       Neck: Inspection unremarkable. No palpable stepoffs. Negative Spurling's maneuver. Full neck range of motion tightness only tenderness on the right. Only TTP in spinal musculature in the trapezius Grip strength and sensation normal in bilateral hands Strength good C4 to T1 distribution No sensory change to C4 to T1 Negative Hoffman sign bilaterally Reflexes normal    OMT Physical Exam  Cervical  Did not assess neck secondary to facial droop today  Thoracic T1 extended rotated and side bent right with elevated first rib T3 extended rotated and side bent left  T5 extended rotated and side bent right   lumbar L2 flexed rotated and side bent right  Sacrum left on left   Impression and Recommendations:     This case required medical decision making of moderate complexity.

## 2015-12-25 NOTE — Assessment & Plan Note (Signed)
Significant improvement after injection. We will try to decrease doing this and hopefully will not have to do it for least another 3-4 months. Encourage patient to continue the exercises 3-4 times a week as regular regimen.

## 2015-12-26 LAB — PROLACTIN: Prolactin: 13.1 ng/mL

## 2015-12-27 ENCOUNTER — Encounter: Payer: Self-pay | Admitting: Endocrinology

## 2015-12-27 ENCOUNTER — Encounter: Payer: Self-pay | Admitting: Internal Medicine

## 2015-12-27 MED ORDER — ONDANSETRON HCL 4 MG PO TABS: 4.0000 mg | ORAL_TABLET | Freq: Three times a day (TID) | ORAL | Status: DC | PRN

## 2016-01-03 ENCOUNTER — Encounter: Payer: Self-pay | Admitting: Endocrinology

## 2016-01-03 ENCOUNTER — Ambulatory Visit (INDEPENDENT_AMBULATORY_CARE_PROVIDER_SITE_OTHER): Payer: Medicare Other | Admitting: Endocrinology

## 2016-01-03 VITALS — BP 132/74 | HR 102 | Temp 97.7°F | Ht 66.0 in | Wt 186.0 lb

## 2016-01-03 DIAGNOSIS — Z794 Long term (current) use of insulin: Secondary | ICD-10-CM

## 2016-01-03 DIAGNOSIS — IMO0001 Reserved for inherently not codable concepts without codable children: Secondary | ICD-10-CM

## 2016-01-03 DIAGNOSIS — E1165 Type 2 diabetes mellitus with hyperglycemia: Secondary | ICD-10-CM | POA: Diagnosis not present

## 2016-01-03 LAB — POCT GLYCOSYLATED HEMOGLOBIN (HGB A1C): Hemoglobin A1C: 6.2

## 2016-01-03 NOTE — Progress Notes (Signed)
Subjective:    Patient ID: Christine [name], female    DOB: [birth year], 65 y.o.   MRN: [id number]  HPI Pt returns for f/u of diabetes mellitus: DM type: Insulin-requiring type 2 Dx'ed: [id number] Complications: TIA Therapy: insulin since 2014 GDM: never. DKA: never Severe hypoglycemia: never. Pancreatitis: never Other: She wants to take insulin just QD.  pioglitizone is for NASH. Interval history: she brings a record of her cbg's which i have reviewed today.  It varies from 95-120.  There is no trend throughout the day.  Since last ov, she has had only 1 episode of hypoglycemia, and this was mild.  This was after a meal was delayed.   Past Medical History  Diagnosis Date  . Allergic rhinitis   . Sleep disorder     [name] May  . Diabetes mellitus   . Migraine headache     quiescent  . TIA (transient ischemic attack)   . Hyperlipidemia   . HTN (hypertension)   . History of recurrent UTIs     [name]    Past Surgical History  Procedure Laterality Date  . Polypectomy  2002    benign, hyperplastic polyp; rectal bleeding 2008  . Total abdominal hysterectomy  1983    BSO for endometriosis  . Coronary angioplasty  1997    for chest pain- negative   . Tonsillectomy    . Wisdom tooth extraction    . Colonoscopy      Dr Carlean [name]; hemorrhoids    Social History   Social History  . Marital Status: Married    Spouse Name: N/A  . Number of Children: N/A  . Years of Education: N/A   Occupational History  . Data Compensation Specialist     Social History Main Topics  . Smoking status: Never Smoker   . Smokeless tobacco: Not on file  . Alcohol Use: No  . Drug Use: No  . Sexual Activity: Not on file   Other Topics Concern  . Not on file   Social History Narrative   Regular exercise- no     Current Outpatient Prescriptions on File Prior to Visit  Medication Sig Dispense Refill  . aspirin 81 MG tablet Take 81 mg by mouth daily.      [name] atorvastatin (LIPITOR) 20 MG  tablet TAKE 1 TABLET (20 MG TOTAL) BY MOUTH DAILY. 90 tablet 2  . baclofen (LIORESAL) 10 MG tablet TAKE 1 TABLET (10 MG TOTAL) BY MOUTH 3 (THREE) TIMES DAILY. 90 tablet 1  . Cholecalciferol (VITAMIN D3) 5000 UNITS CAPS Take 5,000 Units by mouth daily.     [name] desvenlafaxine (PRISTIQ) 100 MG 24 hr tablet Take 100 mg by mouth daily.    . fish oil-omega-3 fatty acids 1000 MG capsule Take 1 g by mouth daily.     . fluticasone (FLONASE) 50 MCG/ACT nasal spray Place 1 spray into both nostrils 2 (two) times daily. 16 g 6  . Insulin Glargine (LANTUS) 100 UNIT/ML Solostar Pen Inject 30 Units into the skin every morning. And pen needles 1/day 250.01    . Lancets (ONETOUCH ULTRASOFT) lancets Check blood sugar once daily. Dx code: 250.00 100 each 12  . LORazepam (ATIVAN) 0.5 MG tablet Take 0.5 mg by mouth 2 (two) times daily as needed. For anxiety.    [name] losartan (COZAAR) 100 MG tablet TAKE 1 TABLET EVERY DAY 90 tablet 2  . omeprazole (PRILOSEC) 20 MG capsule Take 1 capsule (20 mg total) by mouth daily. 30 capsule  6  . ondansetron (ZOFRAN) 4 MG tablet Take 1-2 tablets (4-8 mg total) by mouth every 8 (eight) hours as needed for nausea or vomiting. 60 tablet 0  . ONE TOUCH ULTRA TEST test strip USE 1 STRIP TWICE DAILY DX CODE E11.65 100 each 2  . pioglitazone (ACTOS) 30 MG tablet TAKE 1 TABLET (30 MG TOTAL) BY MOUTH DAILY. 30 tablet 5  . promethazine (PHENERGAN) 12.5 MG tablet Take 1 tablet (12.5 mg total) by mouth every 8 (eight) hours as needed for nausea or vomiting. 20 tablet 0  . SUMAtriptan (IMITREX) 50 MG tablet May repeat in 2 hours if headache persists or recurs. 10 tablet 0  . traMADol (ULTRAM) 50 MG tablet Take 1 tablet (50 mg total) by mouth every 12 (twelve) hours. 60 tablet 1  . vitamin B-12 (CYANOCOBALAMIN) 250 MCG tablet Take 250 mcg by mouth daily.      . busPIRone (BUSPAR) 15 MG tablet Take 15 mg by mouth daily. Reported on 01/03/2016    . meloxicam (MOBIC) 7.5 MG tablet Take 1 tablet (7.5 mg  total) by mouth daily. (Patient not taking: Reported on 01/03/2016) 30 tablet 0   No current facility-administered medications on file prior to visit.    Allergies  Allergen Reactions  . Vioxx [Rofecoxib]     Excess BP which caused TIA  . Prednisone     REACTION:  Mental status changes No associated rash or fever  . Colesevelam     REACTION: Leg Pain    Family History  Problem Relation Age of Onset  . Colon cancer Maternal Aunt   . Diabetes Paternal Uncle   . Heart attack Father 41  . COPD Mother   . Lung cancer Brother     smoker  . Mental illness      niece, committed suicide.  [name] Heart attack      brother X 2; @ 68 & 86  . Stroke Neg Hx     BP 132/74 mmHg  Pulse 102  Temp(Src) 97.7 F (36.5 C) (Oral)  Ht 5\' 6"  (1.676 m)  Wt 186 lb (84.369 kg)  BMI 30.04 kg/m2  SpO2 93%  LMP  (LMP Unknown)  Review of Systems Denies LOC    Objective:   Physical Exam VITAL SIGNS:  See vs page GENERAL: no distress Pulses: dorsalis pedis intact bilat.   MSK: no deformity of the feet CV: no leg edema Skin:  no ulcer on the feet.  normal color and temp on the feet. Neuro: sensation is intact to touch on the feet   A1c=6.2%    Assessment & Plan:  DM: overcontrolled, given this regimen, which does match insulin to her changing needs throughout the day.  Patient is advised the following: Patient Instructions  check your blood sugar twice a day.  vary the time of day when you check, between before the 3 meals, and at bedtime.  also check if you have symptoms of your blood sugar being too high or too low.  please keep a record of the readings and bring it to your next appointment here.  You can write it on any piece of paper.  please call us sooner if your blood sugar goes below 70, or if you have a lot of readings over 200.   Please reduce the lantus to 30 units each morning.   The day before you cataract surgery, take just 25 units.    On this type of insulin schedule, you  should eat  meals on a regular schedule.  If a meal is missed or significantly delayed, your blood sugar could go low.

## 2016-01-03 NOTE — Patient Instructions (Addendum)
check your blood sugar twice a day.  vary the time of day when you check, between before the 3 meals, and at bedtime.  also check if you have symptoms of your blood sugar being too high or too low.  please keep a record of the readings and bring it to your next appointment here.  You can write it on any piece of paper.  please call us sooner if your blood sugar goes below 70, or if you have a lot of readings over 200.   Please reduce the lantus to 30 units each morning.   The day before you cataract surgery, take just 25 units.    On this type of insulin schedule, you should eat meals on a regular schedule.  If a meal is missed or significantly delayed, your blood sugar could go low.

## 2016-01-09 ENCOUNTER — Encounter: Payer: Self-pay | Admitting: Neurology

## 2016-01-09 ENCOUNTER — Ambulatory Visit (INDEPENDENT_AMBULATORY_CARE_PROVIDER_SITE_OTHER): Payer: Medicare Other | Admitting: Neurology

## 2016-01-09 VITALS — BP 122/60 | HR 97 | Ht 65.75 in | Wt 188.0 lb

## 2016-01-09 DIAGNOSIS — R519 Headache, unspecified: Secondary | ICD-10-CM

## 2016-01-09 DIAGNOSIS — R51 Headache: Secondary | ICD-10-CM | POA: Diagnosis not present

## 2016-01-09 DIAGNOSIS — R42 Dizziness and giddiness: Secondary | ICD-10-CM

## 2016-01-09 MED ORDER — GABAPENTIN 300 MG PO CAPS: ORAL_CAPSULE | ORAL | Status: DC

## 2016-01-09 NOTE — Patient Instructions (Signed)
Migraine Recommendations: 1.  Start gabapentin 300mg  at bedtime for 7 days, then increase to 300mg  twice daily (once in AM and once at bedtime).  Call in 4 weeks with update and we can adjust dose if needed. 2.  Take sumatriptan at earliest onset of headache.  May repeat dose once in 2 hours if needed.  Do not exceed two tablets in 24 hours. 3.  Limit use of pain relievers to no more than 2 days out of the week.  These medications include acetaminophen, ibuprofen, tramadol, triptans and narcotics.  This will help reduce risk of rebound headaches. 4.  Be aware of common food triggers such as processed sweets, processed foods with nitrites (such as deli meat, hot dogs, sausages), foods with MSG, alcohol (such as wine), chocolate, certain cheeses, certain fruits (dried fruits, some citrus fruit), vinegar, diet soda. 4.  Avoid caffeine 5.  Routine exercise 6.  Proper sleep hygiene 7.  Stay adequately hydrated with water 8.  Keep a headache diary. 9.  Maintain proper stress management. 10.  Do not skip meals. 11.  Consider supplements:  Magnesium oxide 400mg  to 600mg  daily, riboflavin 400mg , Coenzyme Q 10 100mg  three times daily 12.  Will get MRI of brain 13.  Follow up around June but CALL IN [place]

## 2016-01-09 NOTE — Progress Notes (Signed)
NEUROLOGY CONSULTATION NOTE  Christine [name] MRN: [id number] DOB: 03/07/1951  Referring provider: Dr. Tamala [name] Primary care provider: Dr. Sharlet [name]  Reason for consult:  headache  HISTORY OF PRESENT ILLNESS: Christine [name] is a 65 year old left-handed female with uncontrolled type 2 diabetes,depression and hypertension who presents for headache and more recently dizziness.  History obtained by patient and internal medicine notes.  Onset:  2015-2016 Location:  Right sided, around the right eye radiating to the temple and maxilla Quality:  aching Intensity:  7/10 Aura:  no Prodrome:  no Associated symptoms:  Nausea, right ptosis, photophobia, some tingling around the eye and maxilla.  No visual disturbance Duration:  Gradual onset for several hours later in the day.  Sometimes wakes her up at night Frequency:  2 days per week Triggers/exacerbating factors:  cold Relieving factors:  Heating pad on jaw Activity:  Usually able to function.  She has remote history of migraines up until her 68s or 46s.  These headaches are much different.  She was evaluated by her dentist who found nothing wrong.  She has been told it was trigeminal neuralgia and was previously treated with low dose of gabapentin, which was ineffective.  Over the past two weeks, she reports dizziness.  It is not a spinning sensation and she doesn't feel like she is going to pass out, but she feels lightheaded.  It occurs when she stands up but also while sitting at rest.  She feels unsteady on her feet and says she seems to veer towards the right.  Sed Rate 35, CRP negative  Past NSAIDS:  none Past analgesics:  hydrocodone Past abortive triptans:  none Past muscle relaxants:  cyclobenzaprine Past anti-nausea:  Phenergan Past antihypertensive medications:  propranolol 10mg  as needed (for palpitations) Past antidepressant medications:  Cymbalta, Pristiq Past anticonvulsant medications:  Gabapentin 100mg  three times  daily Past vitamins/Herbal/Supplements:  none Past antihistamines/decongestants:  Flonase  Current NSAIDS:  Advil (takes almost daily) Current analgesics:  tramadol 50mg  (once a week) Current triptans:  sumatriptan 50mg  Current anti-nausea:  Zofran 4mg  Current muscle relaxants:  baclofen 10mg  Antihypertensive medications:  losartan Antidepressant medications:  Pristiq Anticonvulsant medications:  none Vitamins/Herbal/Supplements:  B12, Fish oil Antihistamines/Decongestants:  no  Caffeine:  Diet Coke Alcohol:  no Smoker:  no Diet:  Okay.  Needs to drink more water Exercise:  no Depression/stress:  controlled Sleep hygiene:  varies  PAST MEDICAL HISTORY: Past Medical History  Diagnosis Date  . Allergic rhinitis   . Sleep disorder     Christine [name]  . Diabetes mellitus   . Migraine headache     quiescent  . TIA (transient ischemic attack)   . Hyperlipidemia   . HTN (hypertension)   . History of recurrent UTIs     Christine [name]    PAST SURGICAL HISTORY: Past Surgical History  Procedure Laterality Date  . Polypectomy  2002    benign, hyperplastic polyp; rectal bleeding 2008  . Total abdominal hysterectomy  1983    BSO for endometriosis  . Coronary angioplasty  1997    for chest pain- negative   . Tonsillectomy    . Wisdom tooth extraction    . Colonoscopy      Christine [name]; hemorrhoids    MEDICATIONS: Current Outpatient Prescriptions on File Prior to Visit  Medication Sig Dispense Refill  . aspirin 81 MG tablet Take 81 mg by mouth daily.      [name] atorvastatin (LIPITOR) 20 MG tablet TAKE 1 TABLET (20 MG  TOTAL) BY MOUTH DAILY. 90 tablet 2  . Cholecalciferol (VITAMIN D3) 5000 UNITS CAPS Take 5,000 Units by mouth daily.     [name] desvenlafaxine (PRISTIQ) 100 MG 24 hr tablet Take 100 mg by mouth daily.    . fluticasone (FLONASE) 50 MCG/ACT nasal spray Place 1 spray into both nostrils 2 (two) times daily. 16 g 6  . Insulin Glargine (LANTUS) 100 UNIT/ML Solostar Pen Inject 30  Units into the skin every morning. And pen needles 1/day 250.01    . Lancets (ONETOUCH ULTRASOFT) lancets Check blood sugar once daily. Dx code: 250.00 100 each 12  . LORazepam (ATIVAN) 0.5 MG tablet Take 0.5 mg by mouth 2 (two) times daily as needed. For anxiety.    [name] losartan (COZAAR) 100 MG tablet TAKE 1 TABLET EVERY DAY 90 tablet 2  . omeprazole (PRILOSEC) 20 MG capsule Take 1 capsule (20 mg total) by mouth daily. 30 capsule 6  . ondansetron (ZOFRAN) 4 MG tablet Take 1-2 tablets (4-8 mg total) by mouth every 8 (eight) hours as needed for nausea or vomiting. 60 tablet 0  . ONE TOUCH ULTRA TEST test strip USE 1 STRIP TWICE DAILY DX CODE E11.65 100 each 2  . pioglitazone (ACTOS) 30 MG tablet TAKE 1 TABLET (30 MG TOTAL) BY MOUTH DAILY. 30 tablet 5  . promethazine (PHENERGAN) 12.5 MG tablet Take 1 tablet (12.5 mg total) by mouth every 8 (eight) hours as needed for nausea or vomiting. 20 tablet 0  . SUMAtriptan (IMITREX) 50 MG tablet [name] repeat in 2 hours if headache persists or recurs. 10 tablet 0  . traMADol (ULTRAM) 50 MG tablet Take 1 tablet (50 mg total) by mouth every 12 (twelve) hours. 60 tablet 1  . vitamin B-12 (CYANOCOBALAMIN) 250 MCG tablet Take 250 mcg by mouth daily.       No current facility-administered medications on file prior to visit.    ALLERGIES: Allergies  Allergen Reactions  . Vioxx [Rofecoxib]     Excess BP which caused TIA  . Prednisone     REACTION:  Mental status changes No associated rash or fever  . Colesevelam     REACTION: Leg Pain    FAMILY HISTORY: Family History  Problem Relation Age of Onset  . Colon cancer Maternal Aunt   . Diabetes Paternal Uncle   . Heart attack Father 62  . COPD Mother   . Lung cancer Brother     smoker  . Mental illness      niece, committed suicide.  [name] Heart attack      brother X 2; @ 41 & 58  . Stroke Neg Hx     SOCIAL HISTORY: Social History   Social History  . Marital Status: Married    Spouse Name: N/A  .  Number of Children: N/A  . Years of Education: N/A   Occupational History  . Data Compensation Specialist     Social History Main Topics  . Smoking status: Never Smoker   . Smokeless tobacco: Not on file  . Alcohol Use: No  . Drug Use: No  . Sexual Activity: Not on file   Other Topics Concern  . Not on file   Social History Narrative   Regular exercise- no     REVIEW OF SYSTEMS: Constitutional: No fevers, chills, or sweats, no generalized fatigue, change in appetite Eyes: No visual changes, double vision, eye pain Ear, nose and throat: No hearing loss, ear pain, nasal congestion, sore throat Cardiovascular: No chest  pain, palpitations Respiratory:  No shortness of breath at rest or with exertion, wheezes GastrointestinaI: No nausea, vomiting, diarrhea, abdominal pain, fecal incontinence Genitourinary:  No dysuria, urinary retention or frequency Musculoskeletal:  No neck pain, back pain Integumentary: No rash, pruritus, skin lesions Neurological: as above Psychiatric: No depression, insomnia, anxiety Endocrine: No palpitations, fatigue, diaphoresis, mood swings, change in appetite, change in weight, increased thirst Hematologic/Lymphatic:  No anemia, purpura, petechiae. Allergic/Immunologic: no itchy/runny eyes, nasal congestion, recent allergic reactions, rashes  PHYSICAL EXAM: Filed Vitals:   01/09/16 0755  BP: 122/60  Pulse: 97   General: No acute distress.  Patient appears well-groomed.   Head:  Normocephalic/atraumatic Eyes:  fundi unremarkable, without vessel changes, exudates, hemorrhages or papilledema. Neck: supple, no paraspinal tenderness, full range of motion Back: No paraspinal tenderness Heart: regular rate and rhythm Lungs: Clear to auscultation bilaterally. Vascular: No carotid bruits. Neurological Exam: Mental status: alert and oriented to person, place, and time, recent and remote memory intact, fund of knowledge intact, attention and concentration  intact, speech fluent and not dysarthric, language intact. Cranial nerves: CN I: not tested CN II: pupils equal, round and reactive to light, visual fields intact, fundi unremarkable, without vessel changes, exudates, hemorrhages or papilledema. CN III, IV, VI:  full range of motion, no nystagmus, no ptosis CN V: facial sensation intact CN VII: upper and lower face symmetric CN VIII: hearing intact CN IX, X: gag intact, uvula midline CN XI: sternocleidomastoid and trapezius muscles intact CN XII: tongue midline Bulk & Tone: normal, no fasciculations. Motor:  5/5 throughout  Sensation:  Pinprick sensation intact and vibration sensation decreased in toes. Deep Tendon Reflexes:  2+ throughout except absent in ankles, toes downgoing. Finger to nose testing:  Without dysmetria.  Heel to shin:  Without dysmetria.  Gait:  Normal station and stride.  Able to turn and tandem walk. Romberg negative.  IMPRESSION: New onset headache in patient over 70 years old.  Labs not suggestive of giant cell arteritis.  Symptoms seem more likely migraine rather than trigeminal neuralgia Dizziness  PLAN: 1.  Since these are new onset headaches in patient over 50, and she has been experiencing dizziness and unsteady gait, we will get MRI of brian 2.  She was previously on low dose of gabapentin.  We will restart it, at 300mg  at bedtime and she can increase to twice daily dosing in 1 week if tolerating 3.  Sumatriptan for abortive therapy, must limit pain relievers to no more than 2 days out of the week  4. Follow up around 4 months  Thank you for allowing me to take part in the care of this patient.  Metta Clines, DO  CC:  Hulan Saas, DO  Pricilla Holm, MD

## 2016-01-15 ENCOUNTER — Ambulatory Visit (INDEPENDENT_AMBULATORY_CARE_PROVIDER_SITE_OTHER): Payer: Medicare Other | Admitting: Family Medicine

## 2016-01-15 ENCOUNTER — Encounter: Payer: Self-pay | Admitting: Family Medicine

## 2016-01-15 VITALS — BP 122/78 | HR 83 | Ht 65.75 in | Wt 189.0 lb

## 2016-01-15 DIAGNOSIS — M9903 Segmental and somatic dysfunction of lumbar region: Secondary | ICD-10-CM

## 2016-01-15 DIAGNOSIS — M999 Biomechanical lesion, unspecified: Secondary | ICD-10-CM

## 2016-01-15 DIAGNOSIS — M9902 Segmental and somatic dysfunction of thoracic region: Secondary | ICD-10-CM | POA: Diagnosis not present

## 2016-01-15 DIAGNOSIS — M7061 Trochanteric bursitis, right hip: Secondary | ICD-10-CM

## 2016-01-15 DIAGNOSIS — M9908 Segmental and somatic dysfunction of rib cage: Secondary | ICD-10-CM

## 2016-01-15 DIAGNOSIS — G43109 Migraine with aura, not intractable, without status migrainosus: Secondary | ICD-10-CM | POA: Diagnosis not present

## 2016-01-15 HISTORY — DX: Biomechanical lesion, unspecified: M99.9

## 2016-01-15 NOTE — Assessment & Plan Note (Signed)
Awaiting MRI and seen neurology.

## 2016-01-15 NOTE — Patient Instructions (Signed)
Good to see you  The MRI will be fine.  Keep working on the posture and upper back muscles to take pressure off your neck See me again in 4 weeks.

## 2016-01-15 NOTE — Assessment & Plan Note (Signed)
No longer having any radicular symptoms but to have more tightness of the paraspinal musculature of the lumbar spine. We discussed icing regimen and home exercises. We discussed core strengthening exercises. Patient will work on posture and ergonomics at the day and patient will come back in 4 weeks for further evaluation and treatment.

## 2016-01-15 NOTE — Progress Notes (Signed)
Pre visit review using our clinic review tool, if applicable. No additional management support is needed unless otherwise documented below in the visit note. 

## 2016-01-15 NOTE — Assessment & Plan Note (Signed)
Patient is starting to have some mild increasing tenderness again. Would like to wait at least 12 weeks before another injection. Question of a lumbar radiculopathy is within the differential.

## 2016-01-15 NOTE — Progress Notes (Signed)
  [name] [address] [place], [place] [address] Phone: [phone] Subjective:    CC: Neck and upper back pain follow-up  [id number] Christine [name] is a 65 y.o. female coming in for follow-up of her neck pain. Patient has had some difficulty with headaches recently.  Patient labs came back fairly unremarkable with very minimal elevation in the sedimentation rate. Patient has seen neurology and because of her age and her having an MRI that is pending for next week.   Patient is also going to have cataract surgery next month.  Also has a history of trigeminal neuralgia   Patient was having more of a right-sided hip pain. Has had history greater trochanteric bursitis. Still having some mild pain but nothing severe. Some mild increase in stiffness of the upper back as well as lower back.  Past medical history, social, surgical and family history all reviewed in electronic medical record.   Review of Systems: No headache, visual changes, nausea, vomiting, diarrhea, constipation, dizziness, abdominal pain, skin rash, fevers, chills, night sweats, weight loss, swollen lymph nodes, body aches, joint swelling, muscle aches, chest pain, shortness of breath, mood changes.   Objective Blood pressure 122/78, pulse 83, height 5' 5.75" (1.67 m), weight 189 lb (85.73 kg), SpO2 96 %.  General: No apparent distress alert and oriented x3 mood and affect normal, dressed appropriately.  HEENT: Pupils equal, extraocular movements intact patient is droop is improved. Patient though does have full strength noted with testing. Deep tendon reflexes are intact in all extremities. Respiratory: Patient's speak in full sentences and does not appear short of breath  Cardiovascular: No lower extremity edema, non tender, no erythema  Skin:  No rash noted in the skin today. But not to the exam. No masses palpated. Neurovascularly intact. Abdomen: Soft nontender  No  organomegaly Neuro: Cranial nerves II through XII are intact, neurovascularly intact in all extremities with 2+ DTRs and 2+ pulses.  Lymph: No lymphadenopathy of posterior or anterior cervical chain or axillae bilaterally.  Gait normal with good balance and coordination.  MSK:  Non tender with full range of motion and good stability and symmetric strength and tone of  elbows, wrist, hip, knee and ankles bilaterally.  Right hip shows the patient is mainly tender in the area laterally still.     Neck: Inspection unremarkable. No palpable stepoffs. Negative Spurling's maneuver. Full neck range of motion tightness only tenderness on the right. Only TTP in spinal musculature in the trapezius less than previous exam Grip strength and sensation normal in bilateral hands Strength good C4 to T1 distribution No sensory change to C4 to T1 Negative Hoffman sign bilaterally Reflexes normal    OMT Physical Exam  Cervical  Did not assess neck secondary to headaches  Thoracic T1 extended rotated and side bent right with elevated first rib T3 extended rotated and side bent left  T5 extended rotated and side bent right   lumbar L2 flexed rotated and side bent right  Sacrum left on left   Impression and Recommendations:     This case required medical decision making of moderate complexity.

## 2016-01-15 NOTE — Assessment & Plan Note (Signed)
Decision today to treat with OMT was based on Physical Exam  After verbal consent patient was treated with HVLA, ME, FPR techniques in rib, thoracic, lumbar areas  Patient tolerated the procedure well with improvement in symptoms  Patient given exercises, stretches and lifestyle modifications  See medications in patient instructions if given  Patient will follow up in 4 weeks

## 2016-01-23 ENCOUNTER — Ambulatory Visit (HOSPITAL_COMMUNITY)
Admission: RE | Admit: 2016-01-23 | Discharge: 2016-01-23 | Disposition: A | Discharge status: Home / Self Care | Payer: Medicare Other | Source: Ambulatory Visit | Attending: Neurology | Admitting: Neurology

## 2016-01-23 DIAGNOSIS — R519 Headache, unspecified: Secondary | ICD-10-CM

## 2016-01-23 DIAGNOSIS — R42 Dizziness and giddiness: Secondary | ICD-10-CM | POA: Insufficient documentation

## 2016-01-23 DIAGNOSIS — R51 Headache: Secondary | ICD-10-CM | POA: Diagnosis not present

## 2016-01-23 DIAGNOSIS — I639 Cerebral infarction, unspecified: Secondary | ICD-10-CM | POA: Diagnosis not present

## 2016-01-23 LAB — CREATININE, SERUM: CREATININE: 0.62 mg/dL (ref 0.44–1.00)

## 2016-01-23 MED ORDER — GADOBENATE DIMEGLUMINE 529 MG/ML IV SOLN
15.0000 mL | Freq: Once | INTRAVENOUS | Status: AC | PRN
  Administered 2016-01-23: 15 mL via INTRAVENOUS

## 2016-01-24 ENCOUNTER — Telehealth: Payer: Self-pay

## 2016-01-24 ENCOUNTER — Telehealth: Payer: Self-pay | Admitting: *Deleted

## 2016-01-24 ENCOUNTER — Encounter: Payer: Self-pay | Admitting: Family Medicine

## 2016-01-24 MED ORDER — ATORVASTATIN CALCIUM 20 MG PO TABS: 40.0000 mg | ORAL_TABLET | Freq: Every day | ORAL | Status: DC

## 2016-01-24 NOTE — Telephone Encounter (Signed)
Microvascular ischemic changes are changes in the brain that are related to age, and risk factors such as hypertension and high cholesterol.  It is an incidental finding.  Her headaches are not related to it.

## 2016-01-24 NOTE — Telephone Encounter (Signed)
-----   Message from Pieter Partridge, DO sent at 01/24/2016  7:24 AM EST ----- MRI of brain shows old stroke (which is incidental finding and not cause of head/face pain).  Since she has history of TIA and there is evidence of old stroke, her LDL should be less than 70.  In December it was 105.  I would recommend increasing Lipitor to 40mg  daily and have the fasting lipid panel rechecked in 3 months.  She is already on ASA 81mg  daily, which is appropriate.  There is nothing concerning on MRI to would suggest cause for headache.

## 2016-01-24 NOTE — Telephone Encounter (Signed)
Message relayed to patient. Verbalized understanding and denied questions. New RX sent to pharmacy. Reminder set for 3 month lipid check.

## 2016-01-24 NOTE — Telephone Encounter (Signed)
Patient called with some questions about her MRI results.  Please call her back at [phone].

## 2016-01-24 NOTE — Telephone Encounter (Signed)
Patient called with questions about MRI report. Patient was asking what the microvascular ischemia changes meant. I informed patient that  they are is small areas in the brain where tiny blood vessels have ruptured or clotted off causing, essentially, extremely small areas of strokes. Patient wants to make sure that when she is experience these headaches she is not having another small stroke. Patient would like to know how to differentiate between the two, so she will know when she needs to seek help and when to wait it out. Please advise.

## 2016-02-05 ENCOUNTER — Other Ambulatory Visit: Payer: Self-pay

## 2016-02-05 MED ORDER — GABAPENTIN 300 MG PO CAPS: 300.0000 mg | ORAL_CAPSULE | Freq: Two times a day (BID) | ORAL | Status: DC

## 2016-02-05 NOTE — Telephone Encounter (Signed)
Pt left vm for refills on gabapentin. States it is working well for her. RX submitted.

## 2016-02-06 DIAGNOSIS — H40013 Open angle with borderline findings, low risk, bilateral: Secondary | ICD-10-CM | POA: Diagnosis not present

## 2016-02-06 DIAGNOSIS — H2511 Age-related nuclear cataract, right eye: Secondary | ICD-10-CM | POA: Diagnosis not present

## 2016-02-06 DIAGNOSIS — H2512 Age-related nuclear cataract, left eye: Secondary | ICD-10-CM | POA: Diagnosis not present

## 2016-02-06 DIAGNOSIS — H25011 Cortical age-related cataract, right eye: Secondary | ICD-10-CM | POA: Diagnosis not present

## 2016-02-06 DIAGNOSIS — H25012 Cortical age-related cataract, left eye: Secondary | ICD-10-CM | POA: Diagnosis not present

## 2016-02-06 LAB — HM DIABETES EYE EXAM

## 2016-02-13 ENCOUNTER — Ambulatory Visit: Payer: Medicare Other | Admitting: Family Medicine

## 2016-02-17 DIAGNOSIS — R457 State of emotional shock and stress, unspecified: Secondary | ICD-10-CM | POA: Diagnosis not present

## 2016-02-18 DIAGNOSIS — H2512 Age-related nuclear cataract, left eye: Secondary | ICD-10-CM | POA: Diagnosis not present

## 2016-03-02 ENCOUNTER — Other Ambulatory Visit: Payer: Self-pay

## 2016-03-02 DIAGNOSIS — H25011 Cortical age-related cataract, right eye: Secondary | ICD-10-CM | POA: Diagnosis not present

## 2016-03-02 DIAGNOSIS — H2511 Age-related nuclear cataract, right eye: Secondary | ICD-10-CM | POA: Diagnosis not present

## 2016-03-02 MED ORDER — ATORVASTATIN CALCIUM 40 MG PO TABS: ORAL_TABLET | ORAL | Status: DC

## 2016-03-10 DIAGNOSIS — H2511 Age-related nuclear cataract, right eye: Secondary | ICD-10-CM | POA: Diagnosis not present

## 2016-03-19 ENCOUNTER — Encounter: Payer: Self-pay | Admitting: Geriatric Medicine

## 2016-04-02 ENCOUNTER — Ambulatory Visit (INDEPENDENT_AMBULATORY_CARE_PROVIDER_SITE_OTHER): Payer: Medicare Other | Admitting: Endocrinology

## 2016-04-02 ENCOUNTER — Encounter: Payer: Self-pay | Admitting: Endocrinology

## 2016-04-02 VITALS — BP 128/80 | HR 87 | Temp 98.6°F | Ht 65.75 in | Wt 194.0 lb

## 2016-04-02 DIAGNOSIS — E1151 Type 2 diabetes mellitus with diabetic peripheral angiopathy without gangrene: Secondary | ICD-10-CM | POA: Diagnosis not present

## 2016-04-02 DIAGNOSIS — E1165 Type 2 diabetes mellitus with hyperglycemia: Secondary | ICD-10-CM | POA: Insufficient documentation

## 2016-04-02 DIAGNOSIS — Z794 Long term (current) use of insulin: Secondary | ICD-10-CM

## 2016-04-02 DIAGNOSIS — E119 Type 2 diabetes mellitus without complications: Secondary | ICD-10-CM | POA: Insufficient documentation

## 2016-04-02 DIAGNOSIS — IMO0001 Reserved for inherently not codable concepts without codable children: Secondary | ICD-10-CM

## 2016-04-02 HISTORY — DX: Type 2 diabetes mellitus without complications: E11.9

## 2016-04-02 LAB — POCT GLYCOSYLATED HEMOGLOBIN (HGB A1C): Hemoglobin A1C: 6.7

## 2016-04-02 MED ORDER — PIOGLITAZONE HCL 30 MG PO TABS: ORAL_TABLET | ORAL | Status: DC

## 2016-04-02 NOTE — Patient Instructions (Addendum)
check your blood sugar twice a day.  vary the time of day when you check, between before the 3 meals, and at bedtime.  also check if you have symptoms of your blood sugar being too high or too low.  please keep a record of the readings and bring it to your next appointment here.  You can write it on any piece of paper.  please call us sooner if your blood sugar goes below 70, or if you have a lot of readings over 200.   Please reduce the lantus to 25 units each morning.   On this type of insulin schedule, you should eat meals on a regular schedule.  If a meal is missed or significantly delayed, your blood sugar could go low.  Please come back for a follow-up appointment in 3 months.

## 2016-04-02 NOTE — Progress Notes (Signed)
Subjective:    Patient ID: Christine [name], female    DOB: [birth year], 65 y.o.   MRN: [id number]  HPI Pt returns for f/u of diabetes mellitus: DM type: Insulin-requiring type 2 Dx'ed: 123456 Complications: TIA Therapy: insulin since 2014 GDM: never. DKA: never Severe hypoglycemia: never. Pancreatitis: never Other: She wants to take insulin just QD.  pioglitizone is for NASH. Interval history: she brings a record of her cbg's which i have reviewed today.  It varies from 90-140.  There is no trend throughout the day.   Past Medical History  Diagnosis Date  . Allergic rhinitis   . Sleep disorder     [name] May  . Diabetes mellitus   . Migraine headache     quiescent  . TIA (transient ischemic attack)   . Hyperlipidemia   . HTN (hypertension)   . History of recurrent UTIs     [name]    Past Surgical History  Procedure Laterality Date  . Polypectomy  2002    benign, hyperplastic polyp; rectal bleeding 2008  . Total abdominal hysterectomy  1983    BSO for endometriosis  . Coronary angioplasty  1997    for chest pain- negative   . Tonsillectomy    . Wisdom tooth extraction    . Colonoscopy      Dr Carlean [name]; hemorrhoids    Social History   Social History  . Marital Status: Married    Spouse Name: N/A  . Number of Children: N/A  . Years of Education: N/A   Occupational History  . Data Compensation Specialist     Social History Main Topics  . Smoking status: Never Smoker   . Smokeless tobacco: Not on file  . Alcohol Use: No  . Drug Use: No  . Sexual Activity: Not on file   Other Topics Concern  . Not on file   Social History Narrative   Regular exercise- no     Current Outpatient Prescriptions on File Prior to Visit  Medication Sig Dispense Refill  . aspirin 81 MG tablet Take 81 mg by mouth daily.      [name] atorvastatin (LIPITOR) 40 MG tablet Use as directed 40 tablet 0  . Cholecalciferol (VITAMIN D3) 5000 UNITS CAPS Take 5,000 Units by mouth daily.      [name] desvenlafaxine (PRISTIQ) 100 MG 24 hr tablet Take 100 mg by mouth daily.    . fluticasone (FLONASE) 50 MCG/ACT nasal spray Place 1 spray into both nostrils 2 (two) times daily. 16 g 6  . gabapentin (NEURONTIN) 300 MG capsule Take 1 capsule (300 mg total) by mouth 2 (two) times daily. 60 capsule 3  . Insulin Glargine (LANTUS) 100 UNIT/ML Solostar Pen Inject 25 Units into the skin every morning. And pen needles 1/day 250.01    . Lancets (ONETOUCH ULTRASOFT) lancets Check blood sugar once daily. Dx code: 250.00 100 each 12  . LORazepam (ATIVAN) 0.5 MG tablet Take 0.5 mg by mouth 2 (two) times daily as needed. For anxiety.    [name] losartan (COZAAR) 100 MG tablet TAKE 1 TABLET EVERY DAY 90 tablet 2  . omeprazole (PRILOSEC) 20 MG capsule Take 1 capsule (20 mg total) by mouth daily. 30 capsule 6  . ondansetron (ZOFRAN) 4 MG tablet Take 1-2 tablets (4-8 mg total) by mouth every 8 (eight) hours as needed for nausea or vomiting. 60 tablet 0  . ONE TOUCH ULTRA TEST test strip USE 1 STRIP TWICE DAILY DX CODE E11.65 100 each 2  .  promethazine (PHENERGAN) 12.5 MG tablet Take 1 tablet (12.5 mg total) by mouth every 8 (eight) hours as needed for nausea or vomiting. 20 tablet 0  . SUMAtriptan (IMITREX) 50 MG tablet May repeat in 2 hours if headache persists or recurs. 10 tablet 0  . traMADol (ULTRAM) 50 MG tablet Take 1 tablet (50 mg total) by mouth every 12 (twelve) hours. 60 tablet 1  . vitamin B-12 (CYANOCOBALAMIN) 250 MCG tablet Take 250 mcg by mouth daily.       No current facility-administered medications on file prior to visit.    Allergies  Allergen Reactions  . Vioxx [Rofecoxib]     Excess BP which caused TIA  . Prednisone     REACTION:  Mental status changes No associated rash or fever  . Colesevelam     REACTION: Leg Pain    Family History  Problem Relation Age of Onset  . Colon cancer Maternal Aunt   . Diabetes Paternal Uncle   . Heart attack Father 16  . COPD Mother   . Lung cancer  Brother     smoker  . Mental illness      niece, committed suicide.  [name] Heart attack      brother X 2; @ 73 & 24  . Stroke Neg Hx     BP 128/80 mmHg  Pulse 87  Temp(Src) 98.6 F (37 C) (Oral)  Ht 5' 5.75" (1.67 m)  Wt 194 lb (87.998 kg)  BMI 31.55 kg/m2  SpO2 95%  LMP  (LMP Unknown)    Review of Systems She denies hypoglycemia.      Objective:   Physical Exam VITAL SIGNS:  See vs page GENERAL: no distress Pulses: dorsalis pedis intact bilat.   MSK: no deformity of the feet CV: no leg edema.  Skin:  no ulcer on the feet.  normal color and temp on the feet. Neuro: sensation is intact to touch on the feet.  Lab Results  Component Value Date   HGBA1C 6.7 04/02/2016       Assessment & Plan:  DM: overcontrolled, given this regimen, which does match insulin to her changing needs throughout the day   Patient is advised the following: Patient Instructions  check your blood sugar twice a day.  vary the time of day when you check, between before the 3 meals, and at bedtime.  also check if you have symptoms of your blood sugar being too high or too low.  please keep a record of the readings and bring it to your next appointment here.  You can write it on any piece of paper.  please call us sooner if your blood sugar goes below 70, or if you have a lot of readings over 200.   Please reduce the lantus to 25 units each morning.   On this type of insulin schedule, you should eat meals on a regular schedule.  If a meal is missed or significantly delayed, your blood sugar could go low.  Please come back for a follow-up appointment in 3 months.

## 2016-04-03 DIAGNOSIS — R457 State of emotional shock and stress, unspecified: Secondary | ICD-10-CM | POA: Diagnosis not present

## 2016-04-10 ENCOUNTER — Encounter: Payer: Self-pay | Admitting: Endocrinology

## 2016-04-20 ENCOUNTER — Encounter: Payer: Self-pay | Admitting: Family Medicine

## 2016-04-20 ENCOUNTER — Ambulatory Visit (INDEPENDENT_AMBULATORY_CARE_PROVIDER_SITE_OTHER): Payer: Medicare Other | Admitting: Family Medicine

## 2016-04-20 VITALS — BP 130/74 | HR 87 | Ht 65.75 in | Wt 192.0 lb

## 2016-04-20 DIAGNOSIS — J329 Chronic sinusitis, unspecified: Secondary | ICD-10-CM

## 2016-04-20 DIAGNOSIS — M9903 Segmental and somatic dysfunction of lumbar region: Secondary | ICD-10-CM | POA: Diagnosis not present

## 2016-04-20 DIAGNOSIS — M9902 Segmental and somatic dysfunction of thoracic region: Secondary | ICD-10-CM

## 2016-04-20 DIAGNOSIS — M9908 Segmental and somatic dysfunction of rib cage: Secondary | ICD-10-CM | POA: Diagnosis not present

## 2016-04-20 DIAGNOSIS — M542 Cervicalgia: Secondary | ICD-10-CM

## 2016-04-20 DIAGNOSIS — J320 Chronic maxillary sinusitis: Secondary | ICD-10-CM

## 2016-04-20 DIAGNOSIS — M999 Biomechanical lesion, unspecified: Secondary | ICD-10-CM

## 2016-04-20 HISTORY — DX: Chronic sinusitis, unspecified: J32.9

## 2016-04-20 MED ORDER — DOXYCYCLINE HYCLATE 100 MG PO TABS
100.0000 mg | ORAL_TABLET | Freq: Two times a day (BID) | ORAL | Status: AC
Start: 2016-04-20 — End: 2016-04-27

## 2016-04-20 NOTE — Progress Notes (Signed)
[name] [address] [place], [place] [address] Phone: [phone] Subjective:    CC: Neck and upper back pain follow-up  [id number] Christine [name] is a 65 y.o. female coming in for follow-up of her neck pain. Patient has had some difficulty with headaches recently.  Patient labs came back fairly unremarkable with very minimal elevation in the sedimentation rate.   Patient is also going to have cataract surgery next month.  Also has a history of trigeminal neuralgiaAs well as chronic sinusitis. Is having worsening symptoms again. Patient is having some mild dizziness with it. Feels that her face is having inflammation as well. Wondering what else can possibly help. 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   Patient was having more of a right-sided hip pain. Has had history greater trochanteric bursitis. Worsening since previous exam.  Past medical history, social, surgical and family history all reviewed in electronic medical record.   Review of Systems: No headache, visual changes, nausea, vomiting, diarrhea, constipation, dizziness, abdominal pain, skin rash, fevers, chills, night sweats, weight loss, swollen lymph nodes, body aches, joint swelling, muscle aches, chest pain, shortness of breath, mood changes.   Objective Blood pressure 130/74, pulse 87, height 5' 5.75" (1.67 m), weight 192 lb (87.091 kg), SpO2 96 %.  General: No apparent distress alert and oriented x3 mood and affect normal, dressed appropriately.  HEENT: TTP OF FRONTAL and maxillary sinus bilaterally    . Deep tendon reflexes are intact in all extremities. Respiratory: Patient's speak in full sentences and does not appear short of breath  Cardiovascular: No lower extremity edema, non tender, no erythema  Skin:  No rash noted in the skin today. But not to the exam. No masses palpated.  Neurovascularly intact. Abdomen: Soft nontender  No organomegaly Neuro: Cranial nerves II through XII are intact, neurovascularly intact in all extremities with 2+ DTRs and 2+ pulses.  Lymph: No lymphadenopathy of posterior or anterior cervical chain or axillae bilaterally.  Gait normal with good balance and coordination.  MSK:  Non tender with full range of motion and good stability and symmetric strength and tone of  elbows, wrist, hip, knee and ankles bilaterally.  Right hip shows the patient is mainly tender in the area laterally still over greater trochanteric area and severely more than previous exam   Neck: Inspection unremarkable. No palpable stepoffs. Negative Spurling's maneuver. Full neck range of motion tightness only tenderness on the right.  Grip strength and sensation normal in bilateral hands Strength good C4 to T1 distribution No sensory change to C4 to T1 Negative Hoffman sign bilaterally Reflexes normal    OMT Physical Exam  Cervical  C2 F RS right   Thoracic T1 extended rotated and side bent right with elevated first rib T3 extended rotated and side bent left  T5 extended rotated and side bent right   lumbar L2 flexed rotated and side bent right  Sacrum left on left  After verbal consent patient was prepped in sterile fashion with alcohol swabs. Ethyl chloride used patient was injected with a 22-gauge 3 inch needle into the RIGHT lateral hip in the greater trochanteric area under ultrasound guidance. Picture was taken. Patient had 4 cc of 0.5% Marcaine and 1 cc of Kenalog 40 mg/dL injected. Patient tolerated the procedure well and no blood loss. Pain completely resolved after injection stating proper placement. Post injection instructions given.  Impression and Recommendations:     This case required medical decision making of moderate complexity.

## 2016-04-20 NOTE — Progress Notes (Signed)
Pre visit review using our clinic review tool, if applicable. No additional management support is needed unless otherwise documented below in the visit note. 

## 2016-04-20 NOTE — Assessment & Plan Note (Signed)
Seems to be stable at this time. We did do some mild soft tissue in the neck today. We'll avoid any HVLA of the neck at this moment. We will continue to monitor closely. Patient will see me again in 6-12 weeks.

## 2016-04-20 NOTE — Assessment & Plan Note (Signed)
Doxycycline given today and will be referred to ENT for further evaluation.

## 2016-04-20 NOTE — Patient Instructions (Signed)
Good to see you  Ice is your friend  Have a great time in [place] Injected the hip today  Doxycycline 2 times daily for next week but be careful in the sun  We will refer to ENT  See me again in 6-12 weeks.

## 2016-04-20 NOTE — Assessment & Plan Note (Signed)
Decision today to treat with OMT was based on Physical Exam  After verbal consent patient was treated with HVLA, ME, FPR techniques in rib, thoracic, lumbar areas  Patient tolerated the procedure well with improvement in symptoms  Patient given exercises, stretches and lifestyle modifications  See medications in patient instructions if given  Patient will follow up in 6-12 weeks

## 2016-04-21 ENCOUNTER — Telehealth: Payer: Self-pay

## 2016-04-21 DIAGNOSIS — E785 Hyperlipidemia, unspecified: Secondary | ICD-10-CM

## 2016-04-21 NOTE — Telephone Encounter (Signed)
-----   Message from Amada Kingfisher, [place] sent at 01/24/2016  8:41 AM EST ----- Regarding: Lipid Fasting Lipid

## 2016-04-21 NOTE — Telephone Encounter (Signed)
Pt aware. Will come have drawn fasting.

## 2016-04-23 ENCOUNTER — Encounter: Payer: Self-pay | Admitting: Endocrinology

## 2016-05-06 DIAGNOSIS — R51 Headache: Secondary | ICD-10-CM | POA: Insufficient documentation

## 2016-05-06 DIAGNOSIS — G43909 Migraine, unspecified, not intractable, without status migrainosus: Secondary | ICD-10-CM | POA: Insufficient documentation

## 2016-05-18 DIAGNOSIS — R457 State of emotional shock and stress, unspecified: Secondary | ICD-10-CM | POA: Diagnosis not present

## 2016-05-21 ENCOUNTER — Ambulatory Visit (INDEPENDENT_AMBULATORY_CARE_PROVIDER_SITE_OTHER): Payer: Medicare Other | Admitting: Internal Medicine

## 2016-05-21 ENCOUNTER — Encounter: Payer: Self-pay | Admitting: Internal Medicine

## 2016-05-21 VITALS — BP 128/76 | HR 81 | Temp 98.5°F | Resp 16 | Ht 65.5 in | Wt 192.0 lb

## 2016-05-21 DIAGNOSIS — R21 Rash and other nonspecific skin eruption: Secondary | ICD-10-CM

## 2016-05-21 DIAGNOSIS — M542 Cervicalgia: Secondary | ICD-10-CM

## 2016-05-21 MED ORDER — METRONIDAZOLE 0.75 % EX GEL: 1.0000 "application " | Freq: Two times a day (BID) | CUTANEOUS | Status: DC

## 2016-05-21 MED ORDER — VALACYCLOVIR HCL 1 G PO TABS: 1000.0000 mg | ORAL_TABLET | Freq: Two times a day (BID) | ORAL | Status: DC

## 2016-05-21 NOTE — Patient Instructions (Signed)
We have sent in the valtrex to try for the pain in the face. Take 1 pill twice daily for 10 days. Call and let us know if it helped or send a mychart message.  For the rash we have sent in metronidazole gel to use on the rash twice daily for the next several weeks.   We have placed the referral to the dermatologist in case the gel does not help that way you are in line for a visit sooner.

## 2016-05-21 NOTE — Progress Notes (Signed)
Pre visit review using our clinic review tool, if applicable. No additional management support is needed unless otherwise documented below in the visit note. 

## 2016-05-21 NOTE — Assessment & Plan Note (Signed)
Suspect rosacea on her face and given rx for metronidazole gel to use BID. Concern given the rash and pain for shingles (although the possibility is remote) but since treatment could help rx valtrex 1 g PO BID for 10 days to see if there is improvement in pain given low side effect profile. Referral to dermatology and if she improves with the gel then she can cancel that.

## 2016-05-21 NOTE — Assessment & Plan Note (Signed)
No discrete LAD noted on exam. She has not gotten explanation for her pain in cheek and jaw which is frustrating to her.

## 2016-05-21 NOTE — Progress Notes (Signed)
   Subjective:    Patient ID: Christine [name], female    DOB: 1951/09/05, 65 y.o.   MRN: [id number]  HPI The patient is a 65 YO female coming in for condition which is chronic and acutely worse. She has been having pain in her face and jaw the last 9 months or so. Saw neurology and sports medicine and had MRI which did not give any answers. She is still struggling with it not daily but it is severe when it comes. She is in 7-8/10 pain. Lasts for hours. Can give her a migraine if it comes. She was treated for sinus infection which did not help. She then went to ENT and they did not think she had sinus disease as her MRI was clear. She is now having rash the last 1 week on her cheeks and nose. Worse in the morning and clears some as the day goes on. Redness in her cheeks. No new meds or allergens that she knows of. No new makeup or sunscreen. Has seen dentist and no problems with her teeth or signs of TMJ.   Review of Systems  Constitutional: Negative for fever, activity change, appetite change, fatigue and unexpected weight change.  HENT: Positive for ear pain and sinus pressure.   Eyes: Negative.   Respiratory: Negative for cough, chest tightness, shortness of breath and wheezing.   Cardiovascular: Negative for chest pain, palpitations and leg swelling.  Gastrointestinal: Negative for nausea, abdominal pain, diarrhea, constipation and abdominal distention.  Musculoskeletal: Positive for myalgias, arthralgias and neck pain. Negative for back pain and joint swelling.  Skin: Positive for rash.  Neurological: Negative.       Objective:   Physical Exam  Constitutional: She is oriented to person, place, and time. She appears well-developed and well-nourished.  HENT:  Head: Normocephalic and atraumatic.  Right Ear: External ear normal.  Left Ear: External ear normal.  Mouth/Throat: Oropharynx is clear and moist.  Red rash on the cheeks and nose, lacy appearing, not palpable.   Eyes: EOM are  normal.  Neck: Normal range of motion.  Some soft tissue prominence left jawline but no discrete LAD  Cardiovascular: Normal rate and regular rhythm.   Pulmonary/Chest: Effort normal and breath sounds normal. No respiratory distress. She has no wheezes. She has no rales.  Abdominal: Soft. Bowel sounds are normal. She exhibits no distension. There is no tenderness. There is no rebound.  Musculoskeletal: She exhibits no edema.  Neurological: She is alert and oriented to person, place, and time. Coordination normal.  Skin: Skin is warm and dry.  Psychiatric: She has a normal mood and affect.   Filed Vitals:   05/21/16 1003  BP: 128/76  Pulse: 81  Temp: 98.5 F (36.9 C)  TempSrc: Oral  Resp: 16  Height: 5' 5.5" (1.664 m)  Weight: 192 lb (87.091 kg)  SpO2: 98%      Assessment & Plan:

## 2016-05-25 ENCOUNTER — Other Ambulatory Visit: Payer: Self-pay | Admitting: Neurology

## 2016-05-26 ENCOUNTER — Encounter: Payer: Self-pay | Admitting: Endocrinology

## 2016-05-28 DIAGNOSIS — L719 Rosacea, unspecified: Secondary | ICD-10-CM | POA: Diagnosis not present

## 2016-06-08 ENCOUNTER — Ambulatory Visit (INDEPENDENT_AMBULATORY_CARE_PROVIDER_SITE_OTHER): Payer: Medicare Other | Admitting: Neurology

## 2016-06-08 ENCOUNTER — Other Ambulatory Visit: Payer: Medicare Other

## 2016-06-08 ENCOUNTER — Encounter: Payer: Self-pay | Admitting: Neurology

## 2016-06-08 VITALS — BP 132/76 | HR 96 | Ht 65.5 in | Wt 194.0 lb

## 2016-06-08 DIAGNOSIS — R55 Syncope and collapse: Secondary | ICD-10-CM | POA: Diagnosis not present

## 2016-06-08 DIAGNOSIS — R42 Dizziness and giddiness: Secondary | ICD-10-CM

## 2016-06-08 DIAGNOSIS — G43009 Migraine without aura, not intractable, without status migrainosus: Secondary | ICD-10-CM | POA: Diagnosis not present

## 2016-06-08 DIAGNOSIS — E785 Hyperlipidemia, unspecified: Secondary | ICD-10-CM

## 2016-06-08 DIAGNOSIS — G450 Vertebro-basilar artery syndrome: Secondary | ICD-10-CM | POA: Diagnosis not present

## 2016-06-08 LAB — LIPID PANEL
CHOL/HDL RATIO: 3 ratio (ref <=5.0)
CHOLESTEROL: 154 mg/dL (ref 125–200)
HDL: 51 mg/dL (ref >=46)
LDL CALC: 81 mg/dL (ref <130)
TRIGLYCERIDES: 109 mg/dL (ref <150)
VLDL: 22 mg/dL (ref <30)

## 2016-06-08 NOTE — Patient Instructions (Signed)
1.  Continue sumatriptan when needed 2.  Try stopping the gabapentin 3.  To look for cause of dizziness, will get CTA of head and neck. 4.  Follow up in 6 months.

## 2016-06-08 NOTE — Progress Notes (Signed)
NEUROLOGY FOLLOW UP OFFICE NOTE  PATZY FLOURNOY [id number]  HISTORY OF PRESENT ILLNESS: Christine [name] is a 66 year old left-handed female with uncontrolled type 2 diabetes, depression and hypertension who follows up for headache.  UPDATE: She has only had two migraines since last visit, treated well with sumatriptan or phenergan.  Headaches are triggered by jaw pain, for which she takes Advil. Current NSAIDS:  Advil (twice a week for jaw pain) Current analgesics:  tramadol 50mg  (once a week for hip pain) Current triptans:  sumatriptan 50mg  Current anti-nausea:  Zofran 4mg  Current muscle relaxants:  baclofen 10mg  Antihypertensive medications:  losartan Antidepressant medications:  Pristiq Anticonvulsant medications:  gabapentin Vitamins/Herbal/Supplements:  B12, Fish oil Antihistamines/Decongestants:  no  MRI of brain with and without contrast from 01/23/16 was personally revealed and revealed no acute abnormality but did demonstrate chronic right frontal infarct.  Of note, she reports that when she leans back quickly so her neck is leaning on the sink (such as at the salon) or armrest on the couch, she develops spinning sensation and sensation she is going to pass out.  It does not occur if she lays back down on a flat surface.  HISTORY: Onset:  2015-2016 Location:  Right sided, around the right eye radiating to the temple and maxilla Quality:  aching Intensity:  7/10 Aura:  no Prodrome:  no Associated symptoms:  Nausea, right ptosis, photophobia, some tingling around the eye and maxilla.  No visual disturbance Duration:  Gradual onset for several hours later in the day.  Sometimes wakes her up at night Frequency:  2 days per week Triggers/exacerbating factors:  cold Relieving factors:  Heating pad on jaw Activity:  Usually able to function.  She has remote history of migraines up until her 91s or 20s.  These headaches are much different.  She was evaluated by her dentist  who found nothing wrong.  She has been told it was trigeminal neuralgia and was previously treated with low dose of gabapentin, which was ineffective.  Over the past two weeks, she reports dizziness.  It is not a spinning sensation and she doesn't feel like she is going to pass out, but she feels lightheaded.  It occurs when she stands up but also while sitting at rest.  She feels unsteady on her feet and says she seems to veer towards the right.  Sed Rate 35, CRP negative  PAST MEDICAL HISTORY: Past Medical History  Diagnosis Date  . Allergic rhinitis   . Sleep disorder     [name] May  . Diabetes mellitus   . Migraine headache     quiescent  . TIA (transient ischemic attack)   . Hyperlipidemia   . HTN (hypertension)   . History of recurrent UTIs     [name]    MEDICATIONS: Current Outpatient Prescriptions on File Prior to Visit  Medication Sig Dispense Refill  . aspirin 81 MG tablet Take 81 mg by mouth daily.      [name] atorvastatin (LIPITOR) 40 MG tablet Take 1 tablet (40 mg total) by mouth daily at 6 PM. 30 tablet 2  . Cholecalciferol (VITAMIN D3) 5000 UNITS CAPS Take 5,000 Units by mouth daily.     [name] desvenlafaxine (PRISTIQ) 100 MG 24 hr tablet Take 100 mg by mouth daily.    [name] gabapentin (NEURONTIN) 300 MG capsule Take 1 capsule (300 mg total) by mouth 2 (two) times daily. 60 capsule 3  . Insulin Glargine (LANTUS) 100 UNIT/ML Solostar Pen Inject  25 Units into the skin every morning. And pen needles 1/day 250.01    . Lancets (ONETOUCH ULTRASOFT) lancets Check blood sugar once daily. Dx code: 250.00 100 each 12  . LORazepam (ATIVAN) 0.5 MG tablet Take 0.5 mg by mouth 2 (two) times daily as needed. For anxiety.    [name] losartan (COZAAR) 100 MG tablet TAKE 1 TABLET EVERY DAY 90 tablet 2  . metroNIDAZOLE (METROGEL) 0.75 % gel Apply 1 application topically 2 (two) times daily. 45 g 0  . omeprazole (PRILOSEC) 20 MG capsule Take 1 capsule (20 mg total) by mouth daily. 30 capsule 6  .  ondansetron (ZOFRAN) 4 MG tablet Take 1-2 tablets (4-8 mg total) by mouth every 8 (eight) hours as needed for nausea or vomiting. 60 tablet 0  . ONE TOUCH ULTRA TEST test strip USE 1 STRIP TWICE DAILY DX CODE E11.65 100 each 2  . pioglitazone (ACTOS) 30 MG tablet TAKE 1 TABLET (30 MG TOTAL) BY MOUTH DAILY. 90 tablet 3  . promethazine (PHENERGAN) 12.5 MG tablet Take 1 tablet (12.5 mg total) by mouth every 8 (eight) hours as needed for nausea or vomiting. 20 tablet 0  . SUMAtriptan (IMITREX) 50 MG tablet May repeat in 2 hours if headache persists or recurs. 10 tablet 0  . traMADol (ULTRAM) 50 MG tablet Take 1 tablet (50 mg total) by mouth every 12 (twelve) hours. 60 tablet 1  . valACYclovir (VALTREX) 1000 MG tablet Take 1 tablet (1,000 mg total) by mouth 2 (two) times daily. 20 tablet 0  . vitamin B-12 (CYANOCOBALAMIN) 250 MCG tablet Take 250 mcg by mouth daily.       No current facility-administered medications on file prior to visit.    ALLERGIES: Allergies  Allergen Reactions  . Vioxx [Rofecoxib]     Other reaction(s): Other (See Comments) Excess BP which caused TIA Excess BP which caused TIA  . Prednisone     REACTION:  Mental status changes No associated rash or fever  . Colesevelam     REACTION: Leg Pain    FAMILY HISTORY: Family History  Problem Relation Age of Onset  . Colon cancer Maternal Aunt   . Diabetes Paternal Uncle   . Heart attack Father 80  . COPD Mother   . Lung cancer Brother     smoker  . Mental illness      niece, committed suicide.  [name] Heart attack      brother X 2; @ 32 & 75  . Stroke Neg Hx     SOCIAL HISTORY: Social History   Social History  . Marital Status: Married    Spouse Name: N/A  . Number of Children: N/A  . Years of Education: N/A   Occupational History  . Data Compensation Specialist     Social History Main Topics  . Smoking status: Never Smoker   . Smokeless tobacco: Not on file  . Alcohol Use: No  . Drug Use: No  . Sexual  Activity: Not on file   Other Topics Concern  . Not on file   Social History Narrative   Regular exercise- no     REVIEW OF SYSTEMS: Constitutional: No fevers, chills, or sweats, no generalized fatigue, change in appetite Eyes: No visual changes, double vision, eye pain Ear, nose and throat: No hearing loss, ear pain, nasal congestion, sore throat Cardiovascular: No chest pain, palpitations Respiratory:  No shortness of breath at rest or with exertion, wheezes GastrointestinaI: No nausea, vomiting, diarrhea, abdominal pain, fecal  incontinence Genitourinary:  No dysuria, urinary retention or frequency Musculoskeletal:  No neck pain, back pain Integumentary: No rash, pruritus, skin lesions Neurological: as above Psychiatric: No depression, insomnia, anxiety Endocrine: No palpitations, fatigue, diaphoresis, mood swings, change in appetite, change in weight, increased thirst Hematologic/Lymphatic:  No purpura, petechiae. Allergic/Immunologic: no itchy/runny eyes, nasal congestion, recent allergic reactions, rashes  PHYSICAL EXAM: Filed Vitals:   06/08/16 1051  BP: 132/76  Pulse: 96   General: No acute distress.  Patient appears well-groomed.  normal body habitus. Head:  Normocephalic/atraumatic Eyes:  Fundi examined but not visualized Neck: supple, no paraspinal tenderness, full range of motion Heart:  Regular rate and rhythm Lungs:  Clear to auscultation bilaterally Back: No paraspinal tenderness Neurological Exam: alert and oriented to person, place, and time. Attention span and concentration intact, recent and remote memory intact, fund of knowledge intact.  Speech fluent and not dysarthric, language intact.  CN II-XII intact. Bulk and tone normal, muscle strength 5/5 throughout.  Sensation to light touch, temperature and vibration intact.  Deep tendon reflexes 2+ throughout, toes downgoing.  Finger to nose and heel to shin testing intact.  Gait normal, Romberg  negative.  IMPRESSION: Migraine, triggered by jaw pain.  Dentist reported that she does not have TMJ.  She never saw an oral surgeon Rule out vertebrobasilar insufficiency  PLAN: 1.  She will try discontinuing the gabapentin 2.  She has sumatriptan and Phenergan on-hand if needed. 3.  Will get CTA of head and neck 4.  Follow up in 6 months.  Metta Clines, DO  CC: Pricilla Holm, MD

## 2016-06-12 ENCOUNTER — Telehealth: Payer: Self-pay

## 2016-06-12 NOTE — Telephone Encounter (Signed)
Sent via mychart

## 2016-06-12 NOTE — Telephone Encounter (Signed)
-----   Message from Pieter Partridge, DO sent at 06/12/2016  7:28 AM EDT ----- Lipid profile looks good.  Continue Lipitor as prescribed.  Try to work on getting the diabetes under control

## 2016-06-15 ENCOUNTER — Other Ambulatory Visit: Payer: Self-pay | Admitting: Endocrinology

## 2016-06-17 ENCOUNTER — Ambulatory Visit (INDEPENDENT_AMBULATORY_CARE_PROVIDER_SITE_OTHER): Payer: Medicare Other | Admitting: Family Medicine

## 2016-06-17 ENCOUNTER — Encounter: Payer: Self-pay | Admitting: Family Medicine

## 2016-06-17 VITALS — BP 120/82 | HR 86 | Ht 65.5 in | Wt 191.0 lb

## 2016-06-17 DIAGNOSIS — M7061 Trochanteric bursitis, right hip: Secondary | ICD-10-CM | POA: Insufficient documentation

## 2016-06-17 DIAGNOSIS — M9908 Segmental and somatic dysfunction of rib cage: Secondary | ICD-10-CM | POA: Diagnosis not present

## 2016-06-17 DIAGNOSIS — M542 Cervicalgia: Secondary | ICD-10-CM

## 2016-06-17 DIAGNOSIS — M7062 Trochanteric bursitis, left hip: Secondary | ICD-10-CM

## 2016-06-17 DIAGNOSIS — M9903 Segmental and somatic dysfunction of lumbar region: Secondary | ICD-10-CM

## 2016-06-17 DIAGNOSIS — M9902 Segmental and somatic dysfunction of thoracic region: Secondary | ICD-10-CM | POA: Diagnosis not present

## 2016-06-17 DIAGNOSIS — M999 Biomechanical lesion, unspecified: Secondary | ICD-10-CM

## 2016-06-17 HISTORY — DX: Trochanteric bursitis, left hip: M70.62

## 2016-06-17 NOTE — Patient Instructions (Signed)
Good to see you  pennsaid pinkie amount topically 2 times daily as needed.  Ice 20 minutes 2 times daily. Usually after activity and before bed. Exercises on wall.  Heel and butt touching.  Raise leg 6 inches and hold 2 seconds.  Down slow for count of 4 seconds.  1 set of 30 reps daily on both sides.  I hope the CT gives [place] more info.  If not a lot better see me again in 3 weeks and we will inject the side of the left hip Consider physical therapy  We also may need to look at your back at some point as well.

## 2016-06-17 NOTE — Assessment & Plan Note (Signed)
Patient's neck pain is multifactorial. Patient does have fibromyalgia, I'm concern for some vascular compromise but she is in good hands of neurology who is further working this up. Patient does respond fairly well to osteopathic manipulation will continue to monitor. We discussed icing regimen. We discussed his hips get worse we will consider further evaluation patient's back. Patient once to avoid any other injection at this time. Follow-up 2-4 weeks.

## 2016-06-17 NOTE — Assessment & Plan Note (Signed)
Given topical anti-inflammatories. We discussed icing regimen. Discussed which activities doing which was to avoid. Patient come back in 3 weeks if worsening symptoms we'll consider injection as well as lumbar x-rays to rule out any lumbar radiculopathy that could be contribute in.

## 2016-06-17 NOTE — Assessment & Plan Note (Signed)
Decision today to treat with OMT was based on Physical Exam  After verbal consent patient was treated with HVLA, ME, FPR techniques in rib, thoracic, lumbar areas  Patient tolerated the procedure well with improvement in symptoms  Patient given exercises, stretches and lifestyle modifications  See medications in patient instructions if given  Patient will follow up in 3-4 weeks

## 2016-06-17 NOTE — Progress Notes (Signed)
  [name] [address], [place] [address] Phone: [phone] Subjective:    CC: Neck and upper back pain follow-up  [id number] Christine [name] is a 65 y.o. female coming in for follow-up of her neck pain. Patient has had some difficulty with headaches recently.  Patient did go see the neurologist.patient took her last gabapentin a month ago.patient is awaiting a CT angiogram of the head and neck for further evaluation for vascular compromise. . Patient at last exam was having more of a right-sided hip pain. Was given an injection. States that it is 80% better. Starting have pain on the left side. Some radiation down the leg. Not as severe as what how bad the right side. Once to avoid another possible injection secondary to it always raising her blood sugars. Patient though denies any significant back pain with this.  Past medical history, social, surgical and family history all reviewed in electronic medical record.   Review of Systems: No headache, visual changes, nausea, vomiting, diarrhea, constipation, dizziness, abdominal pain, skin rash, fevers, chills, night sweats, weight loss, swollen lymph nodes, body aches, joint swelling, muscle aches, chest pain, shortness of breath, mood changes.   Objective Blood pressure 120/82, pulse 86, height 5' 5.5" (1.664 m), weight 191 lb (86.637 kg), SpO2 95 %.  General: No apparent distress alert and oriented x3 mood and affect normal, dressed appropriately.  HEENT: TTP OF FRONTAL and maxillary sinus bilaterally    . Deep tendon reflexes are intact in all extremities. Respiratory: Patient's speak in full sentences and does not appear short of breath  Cardiovascular: No lower extremity edema, non tender, no erythema  Skin:  No rash noted in the skin today. But not to the exam. No masses palpated. Neurovascularly intact. Abdomen: Soft nontender  No organomegaly Neuro: Cranial nerves II through XII are  intact, neurovascularly intact in all extremities with 2+ DTRs and 2+ pulses.  Lymph: No lymphadenopathy of posterior or anterior cervical chain or axillae bilaterally.  Gait normal with good balance and coordination.  MSK:  Non tender with full range of motion and good stability and symmetric strength and tone of  elbows, wrist, hip, knee and ankles bilaterally.  Left hip has some pain to palpation on the lateral aspect. Good range of motion. Patient does have a positive Corky Sox. Negative straight leg test. Right side does have some mild discomfort still to palpation as well. Only on the greater trochanteric area. Neurovascular intact distally.   Neck: Inspection unremarkable. No palpable stepoffs. Negative Spurling's maneuver. Full neck range of motion tightness only tenderness on the right. With extension patient does get somewhat lightheaded Grip strength and sensation normal in bilateral hands Strength good C4 to T1 distribution No sensory change to C4 to T1 Negative Hoffman sign bilaterally Reflexes normal    OMT Physical Exam  Cervical  Did not assess secondary to patient's dizziness  Thoracic T1 extended rotated and side bent right with elevated first rib T3 extended rotated and side bent left  T5 extended rotated and side bent right   lumbar L2 flexed rotated and side bent right  Sacrum left on left     Impression and Recommendations:     This case required medical decision making of moderate complexity.

## 2016-06-17 NOTE — Progress Notes (Signed)
Pre visit review using our clinic review tool, if applicable. No additional management support is needed unless otherwise documented below in the visit note. 

## 2016-06-18 ENCOUNTER — Ambulatory Visit
Admission: RE | Admit: 2016-06-18 | Discharge: 2016-06-18 | Disposition: A | Discharge status: Home / Self Care | Payer: Medicare Other | Source: Ambulatory Visit | Attending: Neurology | Admitting: Neurology

## 2016-06-18 DIAGNOSIS — G450 Vertebro-basilar artery syndrome: Secondary | ICD-10-CM

## 2016-06-18 DIAGNOSIS — I6523 Occlusion and stenosis of bilateral carotid arteries: Secondary | ICD-10-CM | POA: Diagnosis not present

## 2016-06-18 MED ORDER — IOPAMIDOL (ISOVUE-370) INJECTION 76%
100.0000 mL | Freq: Once | INTRAVENOUS | Status: AC | PRN
  Administered 2016-06-18: 100 mL via INTRAVENOUS

## 2016-06-19 ENCOUNTER — Telehealth: Payer: Self-pay

## 2016-06-19 NOTE — Telephone Encounter (Signed)
-----   Message from Pieter Partridge, DO sent at 06/19/2016  6:48 AM EDT ----- Imaging of the arteries in the head and neck are okay.

## 2016-06-19 NOTE — Telephone Encounter (Signed)
Message relayed to patient. Verbalized understanding and denied questions.   

## 2016-06-29 ENCOUNTER — Other Ambulatory Visit: Payer: Self-pay | Admitting: *Deleted

## 2016-06-29 MED ORDER — ATORVASTATIN CALCIUM 40 MG PO TABS: 40.0000 mg | ORAL_TABLET | Freq: Every day | ORAL | Status: DC

## 2016-07-02 ENCOUNTER — Ambulatory Visit (INDEPENDENT_AMBULATORY_CARE_PROVIDER_SITE_OTHER): Payer: Medicare Other | Admitting: Endocrinology

## 2016-07-02 ENCOUNTER — Encounter: Payer: Self-pay | Admitting: Endocrinology

## 2016-07-02 VITALS — BP 126/80 | HR 88 | Temp 98.2°F | Wt 190.0 lb

## 2016-07-02 DIAGNOSIS — E119 Type 2 diabetes mellitus without complications: Secondary | ICD-10-CM | POA: Diagnosis not present

## 2016-07-02 LAB — POCT GLYCOSYLATED HEMOGLOBIN (HGB A1C): HEMOGLOBIN A1C: 7.2

## 2016-07-02 MED ORDER — METFORMIN HCL ER 500 MG PO TB24: 500.0000 mg | ORAL_TABLET | Freq: Every day | ORAL | Status: DC

## 2016-07-02 NOTE — Progress Notes (Signed)
Subjective:    Patient ID: Christine [name], female    DOB: 08-20-1951, 65 y.o.   MRN: [id number]  HPI Pt returns for f/u of diabetes mellitus: DM type: Insulin-requiring type 2 Dx'ed: 123456 Complications: TIA Therapy: insulin since 2014. GDM: never. DKA: never Severe hypoglycemia: never. Pancreatitis: never Other: She declines multiple daily injections.  pioglitizone is for NASH. Interval history: she brings a record of her cbg's which i have reviewed today.  It varies from 95-211.  She checks in am only.  She wants to go back to orals if possible, and wants to avoid name brand orals if possible.   Past Medical History  Diagnosis Date  . Allergic rhinitis   . Sleep disorder     [name] May  . Diabetes mellitus   . Migraine headache     quiescent  . TIA (transient ischemic attack)   . Hyperlipidemia   . HTN (hypertension)   . History of recurrent UTIs     [name]    Past Surgical History  Procedure Laterality Date  . Polypectomy  2002    benign, hyperplastic polyp; rectal bleeding 2008  . Total abdominal hysterectomy  1983    BSO for endometriosis  . Coronary angioplasty  1997    for chest pain- negative   . Tonsillectomy    . Wisdom tooth extraction    . Colonoscopy      Dr Carlean [name]; hemorrhoids    Social History   Social History  . Marital Status: Married    Spouse Name: N/A  . Number of Children: N/A  . Years of Education: N/A   Occupational History  . Data Compensation Specialist     Social History Main Topics  . Smoking status: Never Smoker   . Smokeless tobacco: Not on file  . Alcohol Use: No  . Drug Use: No  . Sexual Activity: Not on file   Other Topics Concern  . Not on file   Social History Narrative   Regular exercise- no     Current Outpatient Prescriptions on File Prior to Visit  Medication Sig Dispense Refill  . aspirin 81 MG tablet Take 81 mg by mouth daily.      [name] atorvastatin (LIPITOR) 40 MG tablet Take 1 tablet (40 mg  total) by mouth daily at 6 PM. 90 tablet 1  . Cholecalciferol (VITAMIN D3) 5000 UNITS CAPS Take 5,000 Units by mouth daily.     [name] desvenlafaxine (PRISTIQ) 100 MG 24 hr tablet Take 100 mg by mouth daily.    . Insulin Glargine (LANTUS) 100 UNIT/ML Solostar Pen Inject 10 Units into the skin every morning. And pen needles 1/day 250.01    . Lancets (ONETOUCH ULTRASOFT) lancets Check blood sugar once daily. Dx code: 250.00 100 each 12  . LORazepam (ATIVAN) 0.5 MG tablet Take 0.5 mg by mouth 2 (two) times daily as needed. For anxiety.    [name] losartan (COZAAR) 100 MG tablet TAKE 1 TABLET EVERY DAY 90 tablet 2  . metroNIDAZOLE (METROGEL) 0.75 % gel Apply 1 application topically 2 (two) times daily. 45 g 0  . omeprazole (PRILOSEC) 20 MG capsule Take 1 capsule (20 mg total) by mouth daily. 30 capsule 6  . ondansetron (ZOFRAN) 4 MG tablet Take 1-2 tablets (4-8 mg total) by mouth every 8 (eight) hours as needed for nausea or vomiting. 60 tablet 0  . ONE TOUCH ULTRA TEST test strip USE 1 STRIP TWICE DAILY DX CODE E11.65 100 each 2  .  pioglitazone (ACTOS) 30 MG tablet TAKE 1 TABLET (30 MG TOTAL) BY MOUTH DAILY. 90 tablet 3  . promethazine (PHENERGAN) 12.5 MG tablet Take 1 tablet (12.5 mg total) by mouth every 8 (eight) hours as needed for nausea or vomiting. 20 tablet 0  . SUMAtriptan (IMITREX) 50 MG tablet May repeat in 2 hours if headache persists or recurs. 10 tablet 0  . traMADol (ULTRAM) 50 MG tablet Take 1 tablet (50 mg total) by mouth every 12 (twelve) hours. 60 tablet 1  . vitamin B-12 (CYANOCOBALAMIN) 250 MCG tablet Take 250 mcg by mouth daily.       No current facility-administered medications on file prior to visit.    Allergies  Allergen Reactions  . Vioxx [Rofecoxib]     Other reaction(s): Other (See Comments) Excess BP which caused TIA Excess BP which caused TIA  . Prednisone     REACTION:  Mental status changes No associated rash or fever  . Colesevelam     REACTION: Leg Pain    Family  History  Problem Relation Age of Onset  . Colon cancer Maternal Aunt   . Diabetes Paternal Uncle   . Heart attack Father 70  . COPD Mother   . Lung cancer Brother     smoker  . Mental illness      niece, committed suicide.  [name] Heart attack      brother X 2; @ 54 & 79  . Stroke Neg Hx     BP 126/80 mmHg  Pulse 88  Temp(Src) 98.2 F (36.8 C) (Oral)  Wt 190 lb (86.183 kg)  LMP  (LMP Unknown)  Review of Systems She denies hypoglycemia    Objective:   Physical Exam VITAL SIGNS:  See vs page GENERAL: no distress Pulses: dorsalis pedis intact bilat.   MSK: no deformity of the feet CV: no leg edema Skin:  no ulcer on the feet.  normal color and temp on the feet. Neuro: sensation is intact to touch on the feet  Lab Results  Component Value Date   HGBA1C 7.2 07/02/2016      Assessment & Plan:  Type 2 DM: this is the best control this pt should aim for, given this regimen, which does match insulin to her changing needs throughout the day.  She wants to go off insulin, if possible.    Patient is advised the following: Patient Instructions  check your blood sugar twice a day.  vary the time of day when you check, between before the 3 meals, and at bedtime.  also check if you have symptoms of your blood sugar being too high or too low.  please keep a record of the readings and bring it to your next appointment here.  You can write it on any piece of paper.  please call us sooner if your blood sugar goes below 70, or if you have a lot of readings over 200.   I have sent a prescription to your pharmacy, to add metformin.   Please continue the same pioglitizone.   Please reduce the lantus to 10 units each morning.  This is a guess as to how many units you would need, so please call us next week, to tell us how the blood sugar is doing.  On this type of insulin schedule, you should eat meals on a regular schedule.  If a meal is missed or significantly delayed, your blood sugar could go  low.  Please come back for a follow-up appointment  in 6 weeks.   Renato Shin, MD

## 2016-07-02 NOTE — Patient Instructions (Addendum)
check your blood sugar twice a day.  vary the time of day when you check, between before the 3 meals, and at bedtime.  also check if you have symptoms of your blood sugar being too high or too low.  please keep a record of the readings and bring it to your next appointment here.  You can write it on any piece of paper.  please call us sooner if your blood sugar goes below 70, or if you have a lot of readings over 200.   I have sent a prescription to your pharmacy, to add metformin.   Please continue the same pioglitizone.   Please reduce the lantus to 10 units each morning.  This is a guess as to how many units you would need, so please call us next week, to tell us how the blood sugar is doing.  On this type of insulin schedule, you should eat meals on a regular schedule.  If a meal is missed or significantly delayed, your blood sugar could go low.  Please come back for a follow-up appointment in 6 weeks.

## 2016-07-03 ENCOUNTER — Other Ambulatory Visit: Payer: Self-pay | Admitting: *Deleted

## 2016-07-03 MED ORDER — LOSARTAN POTASSIUM 100 MG PO TABS: 100.0000 mg | ORAL_TABLET | Freq: Every day | ORAL | Status: DC

## 2016-07-04 ENCOUNTER — Other Ambulatory Visit: Payer: Self-pay | Admitting: Neurology

## 2016-07-05 ENCOUNTER — Other Ambulatory Visit: Payer: Self-pay | Admitting: Internal Medicine

## 2016-07-06 DIAGNOSIS — L570 Actinic keratosis: Secondary | ICD-10-CM | POA: Diagnosis not present

## 2016-07-06 DIAGNOSIS — L719 Rosacea, unspecified: Secondary | ICD-10-CM | POA: Diagnosis not present

## 2016-07-07 NOTE — Assessment & Plan Note (Addendum)
Has had difficulty with this previously.  We discussed icing regimen. Encourage patient continued home exercises. We did discuss the possibility of a lumbar radiculopathy.x-rays ordered today. Patient's previous bone scan multiple years ago did show some degenerative disc disease

## 2016-07-07 NOTE — Assessment & Plan Note (Signed)
Decision today to treat with OMT was based on Physical Exam  After verbal consent patient was treated with HVLA, ME, FPR techniques in rib, thoracic, lumbar areas  Patient tolerated the procedure well with improvement in symptoms  Patient given exercises, stretches and lifestyle modifications  See medications in patient instructions if given  Patient will follow up in 3-6 weeks

## 2016-07-07 NOTE — Progress Notes (Signed)
  [name] [address], [place] [address] Phone: [phone] Subjective:    CC: Neck and upper back pain follow-up  [id number]  Christine [name] is a 65 y.o. female coming in for follow-up of her neck pain. Patient has had some difficulty with headaches recently.  Patient is not having any improvement in headaches with her gabapentin so she discontinued this but patient's CT angiogram was unremarkable. Continues to have some headaches but is seeing a specialist soon.  . Continues to have bilateral hip pain. States that the injections do not seem to be helping as much. Still better than what it was prior to the injections but continues to have the difficulty. Unable to do the exercises secondary to the pain. Does respond to osteopathic manipulation.  Past medical history, social, surgical and family history all reviewed in electronic medical record.   Review of Systems: No headache, visual changes, nausea, vomiting, diarrhea, constipation, dizziness, abdominal pain, skin rash, fevers, chills, night sweats, weight loss, swollen lymph nodes,chest pain, shortness of breath, mood changes.   Objective  Blood pressure 126/78, pulse 76, weight 190 lb (86.2 kg).  General: No apparent distress alert and oriented x3 mood and affect normal, dressed appropriately.  HEENT: TTP OF FRONTAL and maxillary sinus bilaterally    . Deep tendon reflexes are intact in all extremities. Respiratory: Patient's speak in full sentences and does not appear short of breath  Cardiovascular: No lower extremity edema, non tender, no erythema  Skin:  No rash noted in the skin today. But not to the exam. No masses palpated. Neurovascularly intact. Abdomen: Soft nontender  No organomegaly Neuro: Cranial nerves II through XII are intact, neurovascularly intact in all extremities with 2+ DTRs and 2+ pulses.  Lymph: No lymphadenopathy of posterior or anterior cervical chain or  axillae bilaterally.  Gait normal with good balance and coordination.  MSK:  Non tender with full range of motion and good stability and symmetric strength and tone of  elbows, wrist, hip, knee and ankles bilaterally.  Bilateral greater trochanteric area seemed to be somewhat tender. Patient does have tenderness of multiple different areas of the muscle. This is in the lower back as well. Negative straight leg test bilaterally. Neurovascularly intact distally. 2+ DTRs   Neck: Inspection unremarkable. No palpable stepoffs. Negative Spurling's maneuver. No lightheadedness today patient does have near full range of motion with some mild stiffness Grip strength and sensation normal in bilateral hands Strength good C4 to T1 distribution No sensory change to C4 to T1 Negative Hoffman sign bilaterally Reflexes normal    OMT Physical Exam  Cervical  C2 flexed rotated and side bent right  Thoracic T1 extended rotated and side bent right with elevated first rib T5 extended rotated and side bent right   lumbar L2 flexed rotated and side bent right  Sacrum left on left     Impression and Recommendations:     This case required medical decision making of moderate complexity.

## 2016-07-08 ENCOUNTER — Ambulatory Visit (INDEPENDENT_AMBULATORY_CARE_PROVIDER_SITE_OTHER): Payer: Medicare Other | Admitting: Family Medicine

## 2016-07-08 ENCOUNTER — Ambulatory Visit (INDEPENDENT_AMBULATORY_CARE_PROVIDER_SITE_OTHER)
Admission: RE | Admit: 2016-07-08 | Discharge: 2016-07-08 | Disposition: A | Discharge status: Home / Self Care | Payer: Medicare Other | Source: Ambulatory Visit | Attending: Family Medicine | Admitting: Family Medicine

## 2016-07-08 ENCOUNTER — Encounter: Payer: Self-pay | Admitting: Family Medicine

## 2016-07-08 VITALS — BP 126/78 | HR 76 | Wt 190.0 lb

## 2016-07-08 DIAGNOSIS — M9908 Segmental and somatic dysfunction of rib cage: Secondary | ICD-10-CM | POA: Diagnosis not present

## 2016-07-08 DIAGNOSIS — M9901 Segmental and somatic dysfunction of cervical region: Secondary | ICD-10-CM | POA: Diagnosis not present

## 2016-07-08 DIAGNOSIS — M7062 Trochanteric bursitis, left hip: Secondary | ICD-10-CM | POA: Diagnosis not present

## 2016-07-08 DIAGNOSIS — M9902 Segmental and somatic dysfunction of thoracic region: Secondary | ICD-10-CM

## 2016-07-08 DIAGNOSIS — M999 Biomechanical lesion, unspecified: Secondary | ICD-10-CM

## 2016-07-08 DIAGNOSIS — M47816 Spondylosis without myelopathy or radiculopathy, lumbar region: Secondary | ICD-10-CM | POA: Diagnosis not present

## 2016-07-08 DIAGNOSIS — M9903 Segmental and somatic dysfunction of lumbar region: Secondary | ICD-10-CM

## 2016-07-08 NOTE — Assessment & Plan Note (Signed)
Patient has had some lumbar radiculopathy in the past. I do feel that patient is having some mild radiculopathy bilaterally. Seems to be worse with standing for long amount of time. Possible spinal stenosis. Discussed with patient that an MRI may be necessary. We will consider that at follow-up in 3 weeks. X-rays pending now.

## 2016-07-08 NOTE — Patient Instructions (Addendum)
Good to see you  I will get a back xray, send you a note.  Ice is your friend Discuss about Pristiq and see what you can.  See me again in 3 weeks. We may need to consider MRI of back if not a lot better.

## 2016-07-13 ENCOUNTER — Telehealth: Payer: Self-pay | Admitting: Endocrinology

## 2016-07-13 NOTE — Telephone Encounter (Signed)
I contacted the pt and advised of Md's instructions. Pt voiced understanding.

## 2016-07-13 NOTE — Telephone Encounter (Signed)
I contacted the pt. Pt called to report blood sugar.  07/10/2016: 8 am: 181  07/11/2016: 7:30 am: 159  07/12/2016: 7:30 am: 149  07/13/2016: 8 am: 163    Pt stated all four numbers are fasting and she does not check her blood sugar at any other time during the day.  Pt is currently taking 10 units of lantus in the am, 500 mg of metformin XR at breakfast and 30 mg of Actos daily. Could you review and please advise if any medication changes need to be made during Dr. Cordelia Pen absence? Thanks!

## 2016-07-13 NOTE — Telephone Encounter (Signed)
She needs to start taking Lantus in the evening at dinnertime from tomorrow and take 12 units daily

## 2016-07-13 NOTE — Telephone Encounter (Signed)
Patient ask you to give her a call °

## 2016-07-27 ENCOUNTER — Other Ambulatory Visit: Payer: Self-pay | Admitting: Family Medicine

## 2016-07-27 NOTE — Telephone Encounter (Signed)
Refill done.  

## 2016-07-28 NOTE — Progress Notes (Signed)
[name] [address] [place], [place] [address] Phone: [phone] Subjective:    CC: Neck and upper back pain follow-up  [id number]  Christine [name] is a 65 y.o. female coming in for follow-up of her neck pain. Patient has had some difficulty with headaches recently.  Patient is not having any improvement in headaches with her gabapentin so she discontinued this but patient's CT angiogram was unremarkable. Patient was told recently that it may be TMJ. Does not want to see a specialist have surgery. We'll continue to monitor. Has tried baclofen but unfortunately cannot take the side effects. Was doing well with the Flexeril but is unable to afford it now. . Continues to have bilateral hip pain. Continues to give her difficulty. Does feel better after manipulation. Patient though states that some mild low back pain. X-rays were taken at last exam showed mild degenerative joint arthritis mostly of the facet joints. Patient denies any worsening pain. Does not want any other interventions at this time. We'll continue to have a dull aching sensation she states. No new symptoms such as bowel or bladder incontinence.  Past Medical History:  Diagnosis Date  . Allergic rhinitis   . Diabetes mellitus   . History of recurrent UTIs    [name]  . HTN (hypertension)   . Hyperlipidemia   . Migraine headache    quiescent  . Sleep disorder    [name] May  . TIA (transient ischemic attack)    Past Surgical History:  Procedure Laterality Date  . COLONOSCOPY     Dr Carlean [name]; hemorrhoids  . CORONARY ANGIOPLASTY  1997   for chest pain- negative   . POLYPECTOMY  2002   benign, hyperplastic polyp; rectal bleeding 2008  . TONSILLECTOMY    . TOTAL ABDOMINAL HYSTERECTOMY  1983   BSO for endometriosis  . WISDOM TOOTH EXTRACTION     Social History   Social History  . Marital status: Married    Spouse name: N/A  . Number of children: N/A  . Years of  education: N/A   Occupational History  . Building services engineer Staffing   Social History Main Topics  . Smoking status: Never Smoker  . Smokeless tobacco: Not on file  . Alcohol use No  . Drug use: No  . Sexual activity: Not on file   Other Topics Concern  . Not on file   Social History Narrative   Regular exercise- no    Allergies  Allergen Reactions  . Vioxx [Rofecoxib]     Other reaction(s): Other (See Comments) Excess BP which caused TIA Excess BP which caused TIA  . Prednisone     REACTION:  Mental status changes No associated rash or fever  . Colesevelam     REACTION: Leg Pain   Family History  Problem Relation Age of Onset  . Colon cancer Maternal Aunt   . Diabetes Paternal Uncle   . Heart attack Father 47  . COPD Mother   . Lung cancer Brother     smoker  . Mental illness      niece, committed suicide.  [name] Heart attack      brother X 2; @ 87 & 33  . Stroke Neg Hx     Past medical history, social, surgical and family history all reviewed in electronic medical record.   Review of Systems: No headache, visual changes, nausea, vomiting, diarrhea, constipation, dizziness, abdominal pain, skin rash, fevers, chills, night sweats,  weight loss, swollen lymph nodes,chest pain, shortness of breath, mood changes.   Objective  Blood pressure 128/82, pulse 82, weight 189 lb (85.7 kg), SpO2 96 %.  General: No apparent distress alert and oriented x3 mood and affect normal, dressed appropriately.  HEENT: TTP OF FRONTAL and maxillary sinus bilaterally    . Deep tendon reflexes are intact in all extremities. Respiratory: Patient's speak in full sentences and does not appear short of breath  Cardiovascular: No lower extremity edema, non tender, no erythema  Skin:  No rash noted in the skin today. But not to the exam. No masses palpated. Neurovascularly intact. Abdomen: Soft nontender  No organomegaly Neuro: Cranial nerves II through XII are intact,  neurovascularly intact in all extremities with 2+ DTRs and 2+ pulses.  Lymph: No lymphadenopathy of posterior or anterior cervical chain or axillae bilaterally.  Gait normal with good balance and coordination.  MSK:  Non tender with full range of motion and good stability and symmetric strength and tone of  elbows, wrist, hip, knee and ankles bilaterally.  Bilateral greater trochanteric area seemed to be somewhat tender. Patient does have tenderness of multiple different areas of the muscle. This is in the lower back as well. Negative straight leg test bilaterally. Neurovascularly intact distally. 2+ DTRs   Neck: Inspection unremarkable. No palpable stepoffs. Negative Spurling's maneuver. Full range of motion of motion with some mild stiffness Grip strength and sensation normal in bilateral hands Strength good C4 to T1 distribution No sensory change to C4 to T1 Negative Hoffman sign bilaterally Reflexes normal    OMT Physical Exam  Cervical  C2 flexed rotated and side bent right C4 flexed rotated and side bent left  Thoracic T1 extended rotated and side bent right with elevated first rib T3 extended rotated and side bent left T5 extended rotated and side bent right   lumbar L2 flexed rotated and side bent right  Sacrum left on left     Impression and Recommendations:     This case required medical decision making of moderate complexity.

## 2016-07-29 ENCOUNTER — Encounter: Payer: Self-pay | Admitting: Family Medicine

## 2016-07-29 ENCOUNTER — Ambulatory Visit (INDEPENDENT_AMBULATORY_CARE_PROVIDER_SITE_OTHER): Payer: Medicare Other | Admitting: Family Medicine

## 2016-07-29 VITALS — BP 128/82 | HR 82 | Wt 189.0 lb

## 2016-07-29 DIAGNOSIS — M9902 Segmental and somatic dysfunction of thoracic region: Secondary | ICD-10-CM

## 2016-07-29 DIAGNOSIS — M9903 Segmental and somatic dysfunction of lumbar region: Secondary | ICD-10-CM | POA: Diagnosis not present

## 2016-07-29 DIAGNOSIS — M9901 Segmental and somatic dysfunction of cervical region: Secondary | ICD-10-CM

## 2016-07-29 DIAGNOSIS — M542 Cervicalgia: Secondary | ICD-10-CM

## 2016-07-29 DIAGNOSIS — M999 Biomechanical lesion, unspecified: Secondary | ICD-10-CM

## 2016-07-29 MED ORDER — TIZANIDINE HCL 2 MG PO TABS: 2.0000 mg | ORAL_TABLET | Freq: Three times a day (TID) | ORAL | 1 refills | Status: DC | PRN

## 2016-07-29 NOTE — Assessment & Plan Note (Signed)
Continues to be multifactorial. I do think that she is doing relatively well. We discussed that the underlying fibromyalgia is also likely contribute in. Continues to respond fairly well to osteopathic manipulation and I think she is not as diligent in doing the exercises. Discussed ergonomics and posture. Will come back again in 6 weeks for further evaluation. Continues respond well to osteopathic manipulation.

## 2016-07-29 NOTE — Assessment & Plan Note (Signed)
Decision today to treat with OMT was based on Physical Exam  After verbal consent patient was treated with HVLA, ME, FPR techniques in rib, thoracic, lumbar areas  Patient tolerated the procedure well with improvement in symptoms  Patient given exercises, stretches and lifestyle modifications  See medications in patient instructions if given  Patient will follow up in 3-6 weeks

## 2016-07-29 NOTE — Patient Instructions (Signed)
Great to see you as always.  zanaflex up to 3 times daily and stop the baclofen .  Keep working on the posture when you can We will monitor the lower back and then consider need for MRI See me again in 4-6 weeks.

## 2016-07-29 NOTE — Assessment & Plan Note (Signed)
We will continue to monitor. Concerned than patient may be having more radicular symptoms to the bilateral hip. X-ray show some facet arthritis. MRI would be needed. Patient wants to hold at this time. Patient was given a different muscle relaxer to see if this will be cheaper.

## 2016-07-31 ENCOUNTER — Encounter: Payer: Self-pay | Admitting: Endocrinology

## 2016-08-10 DIAGNOSIS — R457 State of emotional shock and stress, unspecified: Secondary | ICD-10-CM | POA: Diagnosis not present

## 2016-08-13 ENCOUNTER — Encounter: Payer: Self-pay | Admitting: Endocrinology

## 2016-08-13 ENCOUNTER — Ambulatory Visit (INDEPENDENT_AMBULATORY_CARE_PROVIDER_SITE_OTHER): Payer: Medicare Other | Admitting: Endocrinology

## 2016-08-13 VITALS — BP 126/86 | HR 60 | Wt 187.0 lb

## 2016-08-13 DIAGNOSIS — E1151 Type 2 diabetes mellitus with diabetic peripheral angiopathy without gangrene: Secondary | ICD-10-CM

## 2016-08-13 DIAGNOSIS — Z794 Long term (current) use of insulin: Secondary | ICD-10-CM | POA: Diagnosis not present

## 2016-08-13 MED ORDER — REPAGLINIDE 2 MG PO TABS: 2.0000 mg | ORAL_TABLET | Freq: Three times a day (TID) | ORAL | 11 refills | Status: DC

## 2016-08-13 NOTE — Patient Instructions (Addendum)
check your blood sugar twice a day.  vary the time of day when you check, between before the 3 meals, and at bedtime.  also check if you have symptoms of your blood sugar being too high or too low.  please keep a record of the readings and bring it to your next appointment here.  You can write it on any piece of paper.  please call us sooner if your blood sugar goes below 70, or if you have a lot of readings over 200.   I have sent a prescription to your pharmacy, to add "repaglinide."   Please continue the same pioglitizone and metformin.   Please stop taking the the lantus for now.   please call us next week, to tell us how the blood sugar is doing.  If necessary, we can add "bromocriptine."    Please come back for a follow-up appointment in 6 weeks.

## 2016-08-13 NOTE — Progress Notes (Signed)
Subjective:    Patient ID: Christine [name], female    DOB: 01/07/1951, 65 y.o.   MRN: [id number]  HPI Pt returns for f/u of diabetes mellitus: DM type: Insulin-requiring type 2 Dx'ed: 123456 Complications: TIA Therapy: insulin since 2014, and 2 oral meds.   GDM: never. DKA: never.  Severe hypoglycemia: never.  Pancreatitis: never.   Other: She declines multiple daily injections.  pioglitizone is for NASH; goal is to convert insulin back to oral rx; she wants to avoid name brand orals if possible Interval history: When she decreased the insulin to 10 units qd, cbg's were persistently over 150.  She increased lantus to 20/d.  On this, cbg's averagge approx 140.  There is no trend throughout the day. Past Medical History:  Diagnosis Date  . Allergic rhinitis   . Diabetes mellitus   . History of recurrent UTIs    [name]  . HTN (hypertension)   . Hyperlipidemia   . Migraine headache    quiescent  . Sleep disorder    [name] May  . TIA (transient ischemic attack)     Past Surgical History:  Procedure Laterality Date  . COLONOSCOPY     Dr Carlean [name]; hemorrhoids  . CORONARY ANGIOPLASTY  1997   for chest pain- negative   . POLYPECTOMY  2002   benign, hyperplastic polyp; rectal bleeding 2008  . TONSILLECTOMY    . TOTAL ABDOMINAL HYSTERECTOMY  1983   BSO for endometriosis  . WISDOM TOOTH EXTRACTION      Social History   Social History  . Marital status: Married    Spouse name: N/A  . Number of children: N/A  . Years of education: N/A   Occupational History  . Building services engineer Staffing   Social History Main Topics  . Smoking status: Never Smoker  . Smokeless tobacco: Not on file  . Alcohol use No  . Drug use: No  . Sexual activity: Not on file   Other Topics Concern  . Not on file   Social History Narrative   Regular exercise- no     Current Outpatient Prescriptions on File Prior to Visit  Medication Sig Dispense Refill  . aspirin 81  MG tablet Take 81 mg by mouth daily.      [name] atorvastatin (LIPITOR) 40 MG tablet Take 1 tablet (40 mg total) by mouth daily at 6 PM. 90 tablet 1  . Cholecalciferol (VITAMIN D3) 5000 UNITS CAPS Take 5,000 Units by mouth daily.     [name] desvenlafaxine (PRISTIQ) 100 MG 24 hr tablet Take 100 mg by mouth daily.    [name] gabapentin (NEURONTIN) 300 MG capsule TAKE 1 CAPSULE (300 MG TOTAL) BY MOUTH 2 (TWO) TIMES DAILY. 60 capsule 3  . Lancets (ONETOUCH ULTRASOFT) lancets Check blood sugar once daily. Dx code: 250.00 100 each 12  . LORazepam (ATIVAN) 0.5 MG tablet Take 0.5 mg by mouth 2 (two) times daily as needed. For anxiety.    [name] losartan (COZAAR) 100 MG tablet Take 1 tablet (100 mg total) by mouth daily. 90 tablet 1  . metFORMIN (GLUCOPHAGE-XR) 500 MG 24 hr tablet Take 1 tablet (500 mg total) by mouth daily with breakfast. 90 tablet 3  . metroNIDAZOLE (METROGEL) 0.75 % gel Apply 1 application topically 2 (two) times daily. 45 g 0  . omeprazole (PRILOSEC) 20 MG capsule TAKE 1 CAPSULE (20 MG TOTAL) BY MOUTH DAILY. 30 capsule 6  . ondansetron (ZOFRAN) 4 MG tablet Take 1-2 tablets (4-8  mg total) by mouth every 8 (eight) hours as needed for nausea or vomiting. 60 tablet 0  . ONE TOUCH ULTRA TEST test strip USE 1 STRIP TWICE DAILY DX CODE E11.65 100 each 2  . pioglitazone (ACTOS) 30 MG tablet TAKE 1 TABLET (30 MG TOTAL) BY MOUTH DAILY. 90 tablet 3  . promethazine (PHENERGAN) 12.5 MG tablet Take 1 tablet (12.5 mg total) by mouth every 8 (eight) hours as needed for nausea or vomiting. 20 tablet 0  . SUMAtriptan (IMITREX) 50 MG tablet May repeat in 2 hours if headache persists or recurs. 10 tablet 0  . tiZANidine (ZANAFLEX) 2 MG tablet Take 1 tablet (2 mg total) by mouth every 8 (eight) hours as needed for muscle spasms. 60 tablet 1  . traMADol (ULTRAM) 50 MG tablet TAKE ONE TABLET BY MOUTH EVERY 12 HOURS 60 tablet 0  . vitamin B-12 (CYANOCOBALAMIN) 250 MCG tablet Take 250 mcg by mouth daily.       No current  facility-administered medications on file prior to visit.     Allergies  Allergen Reactions  . Vioxx [Rofecoxib]     Other reaction(s): Other (See Comments) Excess BP which caused TIA Excess BP which caused TIA  . Prednisone     REACTION:  Mental status changes No associated rash or fever  . Colesevelam     REACTION: Leg Pain    Family History  Problem Relation Age of Onset  . Colon cancer Maternal Aunt   . Diabetes Paternal Uncle   . Heart attack Father 11  . COPD Mother   . Lung cancer Brother     smoker  . Mental illness      niece, committed suicide.  [name] Heart attack      brother X 2; @ 13 & 88  . Stroke Neg Hx     BP 126/86   Pulse 60   Wt 187 lb (84.8 kg)   LMP  (LMP Unknown)   BMI 30.65 kg/m   Review of Systems She denies hypoglycemia.      Objective:   Physical Exam VITAL SIGNS:  See vs page GENERAL: no distress Pulses: dorsalis pedis intact bilat.   MSK: no deformity of the feet CV: no leg edema.  Few vv's.   Skin:  no ulcer on the feet.  normal color and temp on the feet. Neuro: sensation is intact to touch on the feet.     Lab Results  Component Value Date   HGBA1C 7.2 07/02/2016       Assessment & Plan:  Type 2 DM: she needs increased rx.  She may be controllable off insulin.

## 2016-08-19 DIAGNOSIS — N3090 Cystitis, unspecified without hematuria: Secondary | ICD-10-CM | POA: Diagnosis not present

## 2016-08-20 ENCOUNTER — Telehealth: Payer: Self-pay | Admitting: Endocrinology

## 2016-08-20 MED ORDER — BROMOCRIPTINE MESYLATE 2.5 MG PO TABS: ORAL_TABLET | ORAL | 11 refills | Status: DC

## 2016-08-20 NOTE — Telephone Encounter (Signed)
I have sent a prescription to your pharmacy, to add "bromocriptine."  I'll see you next time.   Do not take sumatriptan while on this, due to an interaction

## 2016-08-20 NOTE — Telephone Encounter (Signed)
I contacted the patient and she reported her past three days blood sugar readings.  08/18/2016: Fasting: 184 Before bedtime: 263   08/19/2016: Fasting 173  08/20/2016: Fasting: 17 6  Patient confirmed she is taking Actos 30 mg daily, Metformin 500 mg 1 daily and Repaglinide 2 mg three times daily before meals. Patient stated she is concerned with these numbers and wanted to know if she should add Lanuts 10 units daily. Patient stated if she added the latus back what time a day should take it, and if her other oral mediations should be discontinued?  Please advise, Thanks!

## 2016-08-20 NOTE — Telephone Encounter (Signed)
I contacted the patient and advised of MD's instructions. Patent voiced understanding.

## 2016-08-20 NOTE — Telephone Encounter (Signed)
Pt wants [place] to call back

## 2016-08-24 ENCOUNTER — Telehealth: Payer: Self-pay

## 2016-08-24 MED ORDER — INSULIN GLARGINE 100 UNIT/ML SOLOSTAR PEN: 30.0000 [IU] | PEN_INJECTOR | SUBCUTANEOUS | 99 refills | Status: DC

## 2016-08-24 NOTE — Telephone Encounter (Signed)
Ok, I have sent a prescription to your pharmacy, to resume the lantus, 30 units/day Please stop taking the diabetes pills, except the pioglitizone. Please come back for a follow-up appointment in 2 months.

## 2016-08-24 NOTE — Telephone Encounter (Signed)
Patient would like to resume the insulin. She stated she had better blood sugar control while on the insulin.

## 2016-08-24 NOTE — Telephone Encounter (Signed)
I contacted the patient and advised of MD's instructions. Patient voiced understanding.

## 2016-08-24 NOTE — Telephone Encounter (Signed)
Pt is returning your call

## 2016-08-24 NOTE — Telephone Encounter (Signed)
Options: Add another pill, which would be a brand name Resume insulin. Please let us know

## 2016-08-24 NOTE — Telephone Encounter (Signed)
Patient called to report fasting blood sugar sugar readings. 08/21/2016: 167 08/22/2016: 149 08/23/2016: 191 08/24/2016: 176   Patient wanted to know if we would consider going back on insulin? Thanks!

## 2016-09-09 NOTE — Assessment & Plan Note (Signed)
Than relatively constant. Patient seems to be doing okay. Likely has some stressing the area. Has tramadol for breakthrough pain. Follow-up again in 6-8 weeks.

## 2016-09-09 NOTE — Assessment & Plan Note (Signed)
Decision today to treat with OMT was based on Physical Exam  After verbal consent patient was treated with HVLA, ME, FPR techniques in rib, thoracic, lumbar areas  Patient tolerated the procedure well with improvement in symptoms  Patient given exercises, stretches and lifestyle modifications  See medications in patient instructions if given  Patient will follow up in 3-8 weeks

## 2016-09-09 NOTE — Assessment & Plan Note (Signed)
Encourage supplementation 

## 2016-09-09 NOTE — Progress Notes (Signed)
[name] [address] [place], [place] [address] Phone: [phone] Subjective:    CC: Neck and upper back pain follow-up  [id number]  Christine [name] is a 65 y.o. female coming in for follow-up of her neck pain. Patient has had some difficulty with headaches recently.  Patient is not having any improvement in headaches with her gabapentin so she discontinued this but patient's CT angiogram was unremarkable. P Neck pain seems to be doing better. Still some mild pain on the right side. No longer having any of the dizziness. Has not needed tramadol as frequently as she was having previously. . Continues to have bilateral hip pain. Left side all the way better.  Right side still mild.  Past Medical History:  Diagnosis Date  . Allergic rhinitis   . Diabetes mellitus   . History of recurrent UTIs    [name]  . HTN (hypertension)   . Hyperlipidemia   . Migraine headache    quiescent  . Sleep disorder    [name] May  . TIA (transient ischemic attack)    Past Surgical History:  Procedure Laterality Date  . COLONOSCOPY     Dr Carlean [name]; hemorrhoids  . CORONARY ANGIOPLASTY  1997   for chest pain- negative   . POLYPECTOMY  2002   benign, hyperplastic polyp; rectal bleeding 2008  . TONSILLECTOMY    . TOTAL ABDOMINAL HYSTERECTOMY  1983   BSO for endometriosis  . WISDOM TOOTH EXTRACTION     Social History   Social History  . Marital status: Married    Spouse name: N/A  . Number of children: N/A  . Years of education: N/A   Occupational History  . Building services engineer Staffing   Social History Main Topics  . Smoking status: Never Smoker  . Smokeless tobacco: Not on file  . Alcohol use No  . Drug use: No  . Sexual activity: Not on file   Other Topics Concern  . Not on file   Social History Narrative   Regular exercise- no    Allergies  Allergen Reactions  . Vioxx [Rofecoxib]     Other reaction(s): Other (See  Comments) Excess BP which caused TIA Excess BP which caused TIA  . Prednisone     REACTION:  Mental status changes No associated rash or fever  . Colesevelam     REACTION: Leg Pain   Family History  Problem Relation Age of Onset  . Colon cancer Maternal Aunt   . Diabetes Paternal Uncle   . Heart attack Father 28  . COPD Mother   . Lung cancer Brother     smoker  . Mental illness      niece, committed suicide.  [name] Heart attack      brother X 2; @ 90 & 36  . Stroke Neg Hx     Past medical history, social, surgical and family history all reviewed in electronic medical record.   Review of Systems: No headache, visual changes, nausea, vomiting, diarrhea, constipation, dizziness, abdominal pain, skin rash, fevers, chills, night sweats, weight loss, swollen lymph nodes,chest pain, shortness of breath, mood changes.   Objective  There were no vitals taken for this visit.  General: No apparent distress alert and oriented x3 mood and affect normal, dressed appropriately.  HEENT: TTP OF FRONTAL and maxillary sinus bilaterally    . Deep tendon reflexes are intact in all extremities. Respiratory: Patient's speak in full sentences and does not  appear short of breath  Cardiovascular: No lower extremity edema, non tender, no erythema  Skin:  No rash noted in the skin today. But not to the exam. No masses palpated. Neurovascularly intact. Abdomen: Soft nontender  No organomegaly Neuro: Cranial nerves II through XII are intact, neurovascularly intact in all extremities with 2+ DTRs and 2+ pulses.  Lymph: No lymphadenopathy of posterior or anterior cervical chain or axillae bilaterally.  Gait normal with good balance and coordination.  MSK:  Non tender with full range of motion and good stability and symmetric strength and tone of  elbows, wrist, hip, knee and ankles bilaterally.  Only pain over the right greater Trendelenburg bursa.  Neck: Inspection unremarkable. No palpable  stepoffs. Negative Spurling's maneuver. Full range of motion of motion with some mild stiffness Grip strength and sensation normal in bilateral hands Strength good C4 to T1 distribution No sensory change to C4 to T1 Negative Hoffman sign bilaterally Reflexes normal    OMT Physical Exam  Cervical  C2 flexed rotated and side bent right C4 flexed rotated and side bent left  Thoracic T1 extended rotated and side bent right with elevated first rib T3 extended rotated and side bent left T8 extended rotated and side bent right   lumbar L2 flexed rotated and side bent right  Sacrum left on left     Impression and Recommendations:     This case required medical decision making of moderate complexity.

## 2016-09-10 ENCOUNTER — Ambulatory Visit (INDEPENDENT_AMBULATORY_CARE_PROVIDER_SITE_OTHER): Payer: Medicare Other | Admitting: Family Medicine

## 2016-09-10 ENCOUNTER — Encounter: Payer: Self-pay | Admitting: Family Medicine

## 2016-09-10 VITALS — BP 136/84 | HR 87 | Wt 184.0 lb

## 2016-09-10 DIAGNOSIS — E559 Vitamin D deficiency, unspecified: Secondary | ICD-10-CM

## 2016-09-10 DIAGNOSIS — M9908 Segmental and somatic dysfunction of rib cage: Secondary | ICD-10-CM | POA: Diagnosis not present

## 2016-09-10 DIAGNOSIS — M9903 Segmental and somatic dysfunction of lumbar region: Secondary | ICD-10-CM | POA: Diagnosis not present

## 2016-09-10 DIAGNOSIS — M542 Cervicalgia: Secondary | ICD-10-CM

## 2016-09-10 DIAGNOSIS — M9902 Segmental and somatic dysfunction of thoracic region: Secondary | ICD-10-CM | POA: Diagnosis not present

## 2016-09-10 DIAGNOSIS — M999 Biomechanical lesion, unspecified: Secondary | ICD-10-CM

## 2016-09-10 DIAGNOSIS — Z23 Encounter for immunization: Secondary | ICD-10-CM

## 2016-09-10 DIAGNOSIS — M9901 Segmental and somatic dysfunction of cervical region: Secondary | ICD-10-CM | POA: Diagnosis not present

## 2016-09-10 NOTE — Patient Instructions (Signed)
God to see you  Christine [name] is your friend Stay active Exercises on wall.  Heel and butt touching.  Raise leg 6 inches and hold 2 seconds.  Down slow for count of 4 seconds.  1 set of 30 reps daily on both sides.  Keep working on the posture flonase daily for 2 weeks.  See me again in 6 weeks.

## 2016-09-17 ENCOUNTER — Other Ambulatory Visit: Payer: Self-pay | Admitting: Endocrinology

## 2016-09-24 ENCOUNTER — Ambulatory Visit: Payer: Medicare Other | Admitting: Endocrinology

## 2016-10-15 DIAGNOSIS — L821 Other seborrheic keratosis: Secondary | ICD-10-CM | POA: Diagnosis not present

## 2016-10-15 DIAGNOSIS — L719 Rosacea, unspecified: Secondary | ICD-10-CM | POA: Diagnosis not present

## 2016-10-27 ENCOUNTER — Ambulatory Visit (INDEPENDENT_AMBULATORY_CARE_PROVIDER_SITE_OTHER)
Admission: RE | Admit: 2016-10-27 | Discharge: 2016-10-27 | Disposition: A | Discharge status: Home / Self Care | Payer: Medicare Other | Source: Ambulatory Visit | Attending: Family Medicine | Admitting: Family Medicine

## 2016-10-27 ENCOUNTER — Ambulatory Visit (INDEPENDENT_AMBULATORY_CARE_PROVIDER_SITE_OTHER): Payer: Medicare Other | Admitting: Family Medicine

## 2016-10-27 ENCOUNTER — Encounter: Payer: Self-pay | Admitting: Family Medicine

## 2016-10-27 VITALS — BP 132/76 | HR 78 | Ht 66.5 in | Wt 186.0 lb

## 2016-10-27 DIAGNOSIS — M5416 Radiculopathy, lumbar region: Secondary | ICD-10-CM | POA: Diagnosis not present

## 2016-10-27 DIAGNOSIS — M797 Fibromyalgia: Secondary | ICD-10-CM | POA: Diagnosis not present

## 2016-10-27 DIAGNOSIS — M25551 Pain in right hip: Secondary | ICD-10-CM | POA: Diagnosis not present

## 2016-10-27 DIAGNOSIS — M999 Biomechanical lesion, unspecified: Secondary | ICD-10-CM | POA: Diagnosis not present

## 2016-10-27 MED ORDER — GABAPENTIN 100 MG PO CAPS: 200.0000 mg | ORAL_CAPSULE | Freq: Every day | ORAL | 3 refills | Status: DC

## 2016-10-27 NOTE — Assessment & Plan Note (Addendum)
Concern hip pain is secondary to more of a lumbar radiculopathy at this time. X-rays have shown mild facet arthritis to worsening recently. Repeating x-ray at this time. Patient continues to have trouble consider MRI of the back. Has responded well to manipulation. Possibly would be a candidate for an epidural. Started on gabapentin

## 2016-10-27 NOTE — Progress Notes (Signed)
[name] [address] [place], [place] [address] Phone: [phone] Subjective:    CC: Neck and upper back pain follow-up  [id number]  Christine [name] is a 65 y.o. female coming in for follow-up of her neck pain. Patient has had some difficulty with headaches recently.  Patient is not having any improvement in headaches with her gabapentin so she discontinued this but patient's CT angiogram was unremarkable. P Neck pain seems to be doing better. Still some mild pain on the right side. No longer having any of the dizziness. Has not needed tramadol as frequently as she was having previously. . Continues to have bilateral hip pain. More with back pain patient did go hiking did do a lot of walking. Had pain radiating down the posterior and lateral aspects of the left leg and did have some swelling of the left ankle only. This resolved after 48 hours. States have ain more on the bilateral hips as well as the lower back on the left. Different   Past Medical History:  Diagnosis Date  . Allergic rhinitis   . Diabetes mellitus   . History of recurrent UTIs    [name]  . HTN (hypertension)   . Hyperlipidemia   . Migraine headache    quiescent  . Sleep disorder    [name] May  . TIA (transient ischemic attack)    Past Surgical History:  Procedure Laterality Date  . COLONOSCOPY     Dr Carlean [name]; hemorrhoids  . CORONARY ANGIOPLASTY  1997   for chest pain- negative   . POLYPECTOMY  2002   benign, hyperplastic polyp; rectal bleeding 2008  . TONSILLECTOMY    . TOTAL ABDOMINAL HYSTERECTOMY  1983   BSO for endometriosis  . WISDOM TOOTH EXTRACTION     Social History   Social History  . Marital status: Married    Spouse name: N/A  . Number of children: N/A  . Years of education: N/A   Occupational History  . Building services engineer Staffing   Social History Main Topics  . Smoking status: Never Smoker  . Smokeless tobacco: Not on  file  . Alcohol use No  . Drug use: No  . Sexual activity: Not on file   Other Topics Concern  . Not on file   Social History Narrative   Regular exercise- no    Allergies  Allergen Reactions  . Vioxx [Rofecoxib]     Other reaction(s): Other (See Comments) Excess BP which caused TIA Excess BP which caused TIA  . Prednisone     REACTION:  Mental status changes No associated rash or fever  . Colesevelam     REACTION: Leg Pain   Family History  Problem Relation Age of Onset  . Colon cancer Maternal Aunt   . Diabetes Paternal Uncle   . Heart attack Father 36  . COPD Mother   . Lung cancer Brother     smoker  . Mental illness      niece, committed suicide.  [name] Heart attack      brother X 2; @ 69 & 72  . Stroke Neg Hx     Past medical history, social, surgical and family history all reviewed in electronic medical record.   Review of Systems: No , visual changes, , vomiting, diarrhea, constipation, dizziness, abdominal pain, skin rash, fevers, chills, night sweats, weight loss, swollen lymph nodes,chest pain, shortness of breath, mood changes.   Objective  Blood pressure 132/76,  pulse 78, height 5' 6.5" (1.689 m), weight 186 lb (84.4 kg), SpO2 97 %.  Systems examined below as of 10/27/16 General: NAD A&O x3 mood, affect normal  HEENT: Pupils equal, extraocular movements intact no nystagmus Respiratory: not short of breath at rest or with speaking Cardiovascular: No lower extremity edema, non tender Skin: Warm dry intact with no signs of infection or rash on extremities or on axial skeleton. Abdomen: Soft nontender, no masses Neuro: Cranial nerves  intact, neurovascularly intact in all extremities with 2+ DTRs and 2+ pulses. Lymph: No lymphadenopathy appreciated today  Gait normal with good balance and coordination.  MSK: Non tender with full range of motion and good stability and symmetric strength and tone of shoulders, elbows, wrist,  knee hips and ankles bilaterally.     MSK:  Non tender with full range of motion and good stability and symmetric strength and tone of  elbows, wrist, hip, knee and ankles bilaterally.  Mild pain over lateral hips.   Back Exam:  Inspection: Unremarkable  Motion: Flexion 45 deg, Extension 25 deg, Side Bending to 45 deg bilaterally,  Rotation to 45 deg bilaterally  SLR laying: Negative  XSLR laying: Negative  Palpable tenderness: worsening pain in the paraspinal musculature. [name] FABER: negative. Tightness bilaterally.  Sensory change: Gross sensation intact to all lumbar and sacral dermatomes.  Reflexes: 2+ at both patellar tendons, 2+ at achilles tendons, Babinski's downgoing.  Strength at foot  Plantar-flexion: 5/5 Dorsi-flexion: 5/5 Eversion: 5/5 Inversion: 5/5  Leg strength  Quad: 5/5 Hamstring: 5/5 Hip flexor: 5/5 Hip abductors: 5/5  Gait unremarkable.   Neck: Inspection unremarkable. No palpable stepoffs. Negative Spurling's maneuver. Full range of motion of motion with continud stiffness.  Grip strength and sensation normal in bilateral hands Strength good C4 to T1 distribution No sensory change to C4 to T1 Negative Hoffman sign bilaterally Reflexes normal    OMT Physical Exam  Cervical  C2 flexed rotated and side bent right C6 flexed rotated and side bent left  Thoracic T1 extended rotated and side bent right with elevated first rib T3 extended rotated and side bent left T7 extended rotated and side bent right   lumbar L2 flexed rotated and side bent right  Sacrum left on left     Impression and Recommendations:     This case required medical decision making of moderate complexity.

## 2016-10-27 NOTE — Assessment & Plan Note (Signed)
Likely contributing, will monitor, no med change.

## 2016-10-27 NOTE — Assessment & Plan Note (Signed)
Decision today to treat with OMT was based on Physical Exam  After verbal consent patient was treated with HVLA, ME, FPR techniques in rib, thoracic, lumbar areas  Patient tolerated the procedure well with improvement in symptoms  Patient given exercises, stretches and lifestyle modifications  See medications in patient instructions if given  Patient will follow up in 3-8 weeks

## 2016-10-27 NOTE — Patient Instructions (Signed)
Good to see you  Lets get a back xray just to make sure nothing alarming is going on.  Gabapentin 200mg  at night Stay active.  I hope the headache lets up  Happy holidays! See me again in 4 weeks

## 2016-10-28 ENCOUNTER — Ambulatory Visit (INDEPENDENT_AMBULATORY_CARE_PROVIDER_SITE_OTHER): Payer: Medicare Other | Admitting: Endocrinology

## 2016-10-28 ENCOUNTER — Encounter: Payer: Self-pay | Admitting: Endocrinology

## 2016-10-28 VITALS — BP 112/82 | Ht 66.5 in | Wt 185.0 lb

## 2016-10-28 DIAGNOSIS — E1151 Type 2 diabetes mellitus with diabetic peripheral angiopathy without gangrene: Secondary | ICD-10-CM | POA: Diagnosis not present

## 2016-10-28 DIAGNOSIS — Z794 Long term (current) use of insulin: Secondary | ICD-10-CM

## 2016-10-28 LAB — POCT GLYCOSYLATED HEMOGLOBIN (HGB A1C): HEMOGLOBIN A1C: 7.1

## 2016-10-28 NOTE — Patient Instructions (Addendum)
check your blood sugar twice a day.  vary the time of day when you check, between before the 3 meals, and at bedtime.  also check if you have symptoms of your blood sugar being too high or too low.  please keep a record of the readings and bring it to your next appointment here.  You can write it on any piece of paper.  please call us sooner if your blood sugar goes below 70, or if you have a lot of readings over 200.  Please continue the same medications for diabetes.     Please come back for a follow-up appointment in 4 months.    

## 2016-10-28 NOTE — Progress Notes (Signed)
Subjective:    Patient ID: Christine [name], female    DOB: 1951-08-19, 65 y.o.   MRN: [id number]  HPI Pt returns for f/u of diabetes mellitus: DM type: Insulin-requiring type 2 Dx'ed: 123456 Complications: TIA Therapy: insulin since 2014, and 2 oral meds.   GDM: never. DKA: never.  Severe hypoglycemia: never.  Pancreatitis: never.   Other: She declines multiple daily injections; pioglitizone is for NASH; in 2017, an effort to convert insulin back to oral rx was unsuccessful.  Interval history: pt states she feels well in general. she brings a record of her cbg's which i have reviewed today.  It varies from 83-200's.   It is in general higher as the day goes on.   Past Medical History:  Diagnosis Date  . Allergic rhinitis   . Diabetes mellitus   . History of recurrent UTIs    [name]  . HTN (hypertension)   . Hyperlipidemia   . Migraine headache    quiescent  . Sleep disorder    [name] May  . TIA (transient ischemic attack)     Past Surgical History:  Procedure Laterality Date  . COLONOSCOPY     Dr Carlean [name]; hemorrhoids  . CORONARY ANGIOPLASTY  1997   for chest pain- negative   . POLYPECTOMY  2002   benign, hyperplastic polyp; rectal bleeding 2008  . TONSILLECTOMY    . TOTAL ABDOMINAL HYSTERECTOMY  1983   BSO for endometriosis  . WISDOM TOOTH EXTRACTION      Social History   Social History  . Marital status: Married    Spouse name: N/A  . Number of children: N/A  . Years of education: N/A   Occupational History  . Building services engineer Staffing   Social History Main Topics  . Smoking status: Never Smoker  . Smokeless tobacco: Not on file  . Alcohol use No  . Drug use: No  . Sexual activity: Not on file   Other Topics Concern  . Not on file   Social History Narrative   Regular exercise- no     Current Outpatient Prescriptions on File Prior to Visit  Medication Sig Dispense Refill  . aspirin 81 MG tablet Take 81 mg by mouth  daily.      [name] atorvastatin (LIPITOR) 40 MG tablet Take 1 tablet (40 mg total) by mouth daily at 6 PM. 90 tablet 1  . BD PEN NEEDLE NANO U/F 32G X 4 MM MISC USE ONCE A DAY AS DIRECTED 90 each 3  . Cholecalciferol (VITAMIN D3) 5000 UNITS CAPS Take 5,000 Units by mouth daily.     [name] desvenlafaxine (PRISTIQ) 100 MG 24 hr tablet Take 100 mg by mouth daily.    [name] gabapentin (NEURONTIN) 100 MG capsule Take 2 capsules (200 mg total) by mouth at bedtime. 60 capsule 3  . Insulin Glargine (LANTUS SOLOSTAR) 100 UNIT/ML Solostar Pen Inject 30 Units into the skin every morning. And pen needles 1/day 250.01 5 pen PRN  . Lancets (ONETOUCH ULTRASOFT) lancets Check blood sugar once daily. Dx code: 250.00 100 each 12  . LANTUS SOLOSTAR 100 UNIT/ML Solostar Pen INJECT 35 UNITS INTO THE SKIN EVERY MORNING 15 pen 7  . LORazepam (ATIVAN) 0.5 MG tablet Take 0.5 mg by mouth 2 (two) times daily as needed. For anxiety.    [name] losartan (COZAAR) 100 MG tablet Take 1 tablet (100 mg total) by mouth daily. 90 tablet 1  . metroNIDAZOLE (METROGEL) 0.75 % gel Apply 1  application topically 2 (two) times daily. 45 g 0  . omeprazole (PRILOSEC) 20 MG capsule TAKE 1 CAPSULE (20 MG TOTAL) BY MOUTH DAILY. 30 capsule 6  . ondansetron (ZOFRAN) 4 MG tablet Take 1-2 tablets (4-8 mg total) by mouth every 8 (eight) hours as needed for nausea or vomiting. 60 tablet 0  . ONE TOUCH ULTRA TEST test strip USE 1 STRIP TWICE DAILY DX CODE E11.65 100 each 2  . pioglitazone (ACTOS) 30 MG tablet TAKE 1 TABLET (30 MG TOTAL) BY MOUTH DAILY. 90 tablet 3  . promethazine (PHENERGAN) 12.5 MG tablet Take 1 tablet (12.5 mg total) by mouth every 8 (eight) hours as needed for nausea or vomiting. 20 tablet 0  . SUMAtriptan (IMITREX) 50 MG tablet May repeat in 2 hours if headache persists or recurs. 10 tablet 0  . tiZANidine (ZANAFLEX) 2 MG tablet Take 1 tablet (2 mg total) by mouth every 8 (eight) hours as needed for muscle spasms. 60 tablet 1  . traMADol (ULTRAM) 50  MG tablet TAKE ONE TABLET BY MOUTH EVERY 12 HOURS 60 tablet 0  . vitamin B-12 (CYANOCOBALAMIN) 250 MCG tablet Take 250 mcg by mouth daily.       No current facility-administered medications on file prior to visit.     Allergies  Allergen Reactions  . Vioxx [Rofecoxib]     Other reaction(s): Other (See Comments) Excess BP which caused TIA Excess BP which caused TIA  . Prednisone     REACTION:  Mental status changes No associated rash or fever  . Colesevelam     REACTION: Leg Pain    Family History  Problem Relation Age of Onset  . Colon cancer Maternal Aunt   . Diabetes Paternal Uncle   . Heart attack Father 69  . COPD Mother   . Lung cancer Brother     smoker  . Mental illness      niece, committed suicide.  [name] Heart attack      brother X 2; @ 66 & 41  . Stroke Neg Hx     BP 112/82   Ht 5' 6.5" (1.689 m)   Wt 185 lb (83.9 kg)   LMP  (LMP Unknown)   BMI 29.41 kg/m    Review of Systems She denies hypoglycemia    Objective:   Physical Exam VITAL SIGNS:  See vs page GENERAL: no distress Pulses: dorsalis pedis intact bilat.   MSK: no deformity of the feet CV: trace bilat leg edema.  Few vv's.   Skin:  no ulcer on the feet.  normal color and temp on the feet. Neuro: sensation is intact to touch on the feet.    A1c=7.1%    Assessment & Plan:  Insulin-requiring type 2 DM, with TIA: this is the best control this pt should aim for, given this regimen, which does match insulin to her changing needs throughout the day.   Patient is advised the following: Patient Instructions  check your blood sugar twice a day.  vary the time of day when you check, between before the 3 meals, and at bedtime.  also check if you have symptoms of your blood sugar being too high or too low.  please keep a record of the readings and bring it to your next appointment here.  You can write it on any piece of paper.  please call us sooner if your blood sugar goes below 70, or if you have a lot  of readings over 200.  Please continue the same medications for diabetes.      Please come back for a follow-up appointment in 4 months.

## 2016-11-02 DIAGNOSIS — R457 State of emotional shock and stress, unspecified: Secondary | ICD-10-CM | POA: Diagnosis not present

## 2016-11-22 NOTE — Progress Notes (Signed)
[name] [place], [place] [address] Phone: [phone] Subjective:    CC: Neck and upper back pain follow-up  [id number]  Christine [name] is a 65 y.o. female coming in for follow-up of her neck pain. Patient states doing well, conservative therapy. Seems to be stable overall. Is having another migraine. When she does have migraines do have tightness in the neck.    Continues to have bilateral hip pain. Likely from the back. worsening on the left side, comes and goes. No weakness but more difficulty with taking stairs regularly. Patient states that the pain seems to be on the lateral aspect the hip and does radiate down the legs bilateral any fevers chills or any abnormal weight loss. Worsening of previous exam and worse on the left side than the right side.  X-rays the patient's lumbar spine were taken at facet OA.  Independently visualized by me.     Past Medical History:  Diagnosis Date  . Allergic rhinitis   . Diabetes mellitus   . History of recurrent UTIs    [name]  . HTN (hypertension)   . Hyperlipidemia   . Migraine headache    quiescent  . Sleep disorder    [name] May  . TIA (transient ischemic attack)    Past Surgical History:  Procedure Laterality Date  . COLONOSCOPY     Dr Carlean [name]; hemorrhoids  . CORONARY ANGIOPLASTY  1997   for chest pain- negative   . POLYPECTOMY  2002   benign, hyperplastic polyp; rectal bleeding 2008  . TONSILLECTOMY    . TOTAL ABDOMINAL HYSTERECTOMY  1983   BSO for endometriosis  . WISDOM TOOTH EXTRACTION     Social History   Social History  . Marital status: Married    Spouse name: N/A  . Number of children: N/A  . Years of education: N/A   Occupational History  . Building services engineer Staffing   Social History Main Topics  . Smoking status: Never Smoker  . Smokeless tobacco: Not on file  . Alcohol use No  . Drug use: No  . Sexual activity: Not on file    Other Topics Concern  . Not on file   Social History Narrative   Regular exercise- no    Allergies  Allergen Reactions  . Vioxx [Rofecoxib]     Other reaction(s): Other (See Comments) Excess BP which caused TIA Excess BP which caused TIA  . Prednisone     REACTION:  Mental status changes No associated rash or fever  . Colesevelam     REACTION: Leg Pain   Family History  Problem Relation Age of Onset  . Colon cancer Maternal Aunt   . Diabetes Paternal Uncle   . Heart attack Father 38  . COPD Mother   . Lung cancer Brother     smoker  . Mental illness      niece, committed suicide.  [name] Heart attack      brother X 2; @ 36 & 70  . Stroke Neg Hx     Past medical history, social, surgical and family history all reviewed in electronic medical record.   Review of Systems: No nausea, vomiting, diarrhea, constipation, dizziness, abdominal pain, skin rash, fevers, chills, night sweats, weight loss, swollen lymph nodes,  chest pain, shortness of breath, mood changes.    Objective  Blood pressure 124/82, pulse 81, height 5' 6.5" (1.689 m), weight 180 lb (81.6 kg), SpO2 97 %.  Systems examined below as of 11/23/16 General: NAD A&O x3 mood, affect normal  HEENT: Pupils equal, extraocular movements intact no nystagmus Respiratory: not short of breath at rest or with speaking Cardiovascular: No lower extremity edema, non tender Skin: Warm dry intact with no signs of infection or rash on extremities or on axial skeleton. Abdomen: Soft nontender, no masses Neuro: Cranial nerves  intact, neurovascularly intact in all extremities with 2+ DTRs and 2+ pulses. Lymph: No lymphadenopathy appreciated today  Gait normal with good balance and coordination.  MSK:  Non tender with full range of motion and good stability and symmetric strength and tone of  elbows, wrist, hip, knee and ankles bilaterally.  Mild pain over lateral hips.   Back Exam:  Inspection: Unremarkable  Motion: Flexion  25 deg worsening symptoms. , Extension 20 deg, Side Bending to 30 deg bilaterally,  Rotation to 30 deg bilaterally  SLR laying: ? Positive right side.  XSLR laying: Negative  Palpable tenderness: worsening pain in the paraspinal musculature. [name] FABER: . Tightness bilaterally.  Sensory change: Gross sensation intact to all lumbar and sacral dermatomes.  Reflexes: 2+ at both patellar tendons, 2+ at achilles tendons, Babinski's downgoing.  Strength at foot  Plantar-flexion: 5/5 Dorsi-flexion: 5/5 Eversion: 5/5 Inversion: 5/5  Leg strength  Quad: 5/5 Hamstring: 5/5 Hip flexor: 5/5 Hip abductors: 4/5  Gait unremarkable.   Neck: Inspection unremarkable. No palpable stepoffs. Negative Spurling's maneuver. Full range of motion of motion with continued stiffness.  Grip strength and sensation normal in bilateral hands Strength good C4 to T1 distribution No sensory change to C4 to T1 Negative Hoffman sign bilaterally Reflexes normal    OMT Physical Exam  Cervical  C2 flexed rotated and side bent right C4 flexed rotated and side bent left  Thoracic T1 extended rotated and side bent right with elevated first rib T5 extended rotated and side bent left T9 extended rotated and side bent right   lumbar L3 flexed rotated and side bent right  Sacrum left on left     Impression and Recommendations:     This case required medical decision making of moderate complexity.

## 2016-11-23 ENCOUNTER — Encounter: Payer: Self-pay | Admitting: Family Medicine

## 2016-11-23 ENCOUNTER — Ambulatory Visit (INDEPENDENT_AMBULATORY_CARE_PROVIDER_SITE_OTHER): Payer: Medicare Other | Admitting: Family Medicine

## 2016-11-23 VITALS — BP 124/82 | HR 81 | Ht 66.5 in | Wt 180.0 lb

## 2016-11-23 DIAGNOSIS — M5416 Radiculopathy, lumbar region: Secondary | ICD-10-CM | POA: Diagnosis not present

## 2016-11-23 DIAGNOSIS — M999 Biomechanical lesion, unspecified: Secondary | ICD-10-CM

## 2016-11-23 MED ORDER — TRAMADOL HCL 50 MG PO TABS: 50.0000 mg | ORAL_TABLET | Freq: Two times a day (BID) | ORAL | 0 refills | Status: DC

## 2016-11-23 MED ORDER — NAPROXEN 500 MG PO TABS: 500.0000 mg | ORAL_TABLET | Freq: Two times a day (BID) | ORAL | 3 refills | Status: DC

## 2016-11-23 NOTE — Assessment & Plan Note (Signed)
Decision today to treat with OMT was based on Physical Exam  After verbal consent patient was treated with HVLA, ME, FPR techniques in rib, thoracic, lumbar areas  Patient tolerated the procedure well with improvement in symptoms  Patient given exercises, stretches and lifestyle modifications  See medications in patient instructions if given  Patient will follow up in 4-8 weeks

## 2016-11-23 NOTE — Patient Instructions (Addendum)
Good to see you  Happy holidays!  Stay active Refilled tramadol today  Naproxen 2 times daily for 10 days then as needed If worsening pain we may need mri of your back  See me again in 4 weeks!

## 2016-11-23 NOTE — Assessment & Plan Note (Signed)
Likely worsening symptoms,  Mild radiation  Decline prednisone, and MRI.  Will consider in future, still will do OMT No significant changes.  RTC in 4 weeks.

## 2016-12-16 ENCOUNTER — Telehealth: Payer: Self-pay | Admitting: Endocrinology

## 2016-12-16 MED ORDER — BASAGLAR KWIKPEN 100 UNIT/ML ~~LOC~~ SOPN: 35.0000 [IU] | PEN_INJECTOR | Freq: Every day | SUBCUTANEOUS | 2 refills | Status: DC

## 2016-12-16 NOTE — Telephone Encounter (Signed)
See message and please advise, Thanks!  

## 2016-12-16 NOTE — Telephone Encounter (Signed)
I contacted the patient and advised of message. Patient voiced understanding and had no further questions at this time.  

## 2016-12-16 NOTE — Telephone Encounter (Signed)
Please change to basaglar--same dosage

## 2016-12-16 NOTE — Telephone Encounter (Signed)
Insurance will not cover Lantus there are three other alternative patient can take, Mudlogger tresiba.  please advise

## 2016-12-21 ENCOUNTER — Encounter: Payer: Self-pay | Admitting: Neurology

## 2016-12-21 ENCOUNTER — Telehealth: Payer: Self-pay | Admitting: Internal Medicine

## 2016-12-21 ENCOUNTER — Ambulatory Visit (INDEPENDENT_AMBULATORY_CARE_PROVIDER_SITE_OTHER): Payer: Medicare Other | Admitting: Neurology

## 2016-12-21 VITALS — BP 112/62 | HR 82 | Ht 66.5 in | Wt 185.7 lb

## 2016-12-21 DIAGNOSIS — I679 Cerebrovascular disease, unspecified: Secondary | ICD-10-CM | POA: Diagnosis not present

## 2016-12-21 DIAGNOSIS — G43009 Migraine without aura, not intractable, without status migrainosus: Secondary | ICD-10-CM

## 2016-12-21 MED ORDER — PROMETHAZINE HCL 12.5 MG PO TABS: 12.5000 mg | ORAL_TABLET | Freq: Three times a day (TID) | ORAL | 3 refills | Status: DC | PRN

## 2016-12-21 MED ORDER — BASAGLAR KWIKPEN 100 UNIT/ML ~~LOC~~ SOPN: 35.0000 [IU] | PEN_INJECTOR | Freq: Every day | SUBCUTANEOUS | 2 refills | Status: DC

## 2016-12-21 NOTE — Progress Notes (Signed)
NEUROLOGY FOLLOW UP OFFICE NOTE  MAIMOUNA APLEY [id number]  HISTORY OF PRESENT ILLNESS: Christine [name] is a 66 year old left-handed female with uncontrolled type 2 diabetes, depression and hypertension who follows up for headache.   UPDATE: She has had approximately one migraine a month.  They are severe.  They last 2-3 hours. Current NSAIDS:  Advil (twice a week for jaw pain) Current analgesics:  tramadol 50mg  (once a week for hip pain) Current triptans:  sumatriptan 50mg  Current anti-nausea:  Zofran 4mg  Current muscle relaxants:  baclofen 10mg  Antihypertensive medications:  losartan Antidepressant medications:  Pristiq Anticonvulsant medications:  gabapentin Vitamins/Herbal/Supplements:  B12, Fish oil Antihistamines/Decongestants:  no  Depression/anxiety:  no     Of note, she reports that when she leans back quickly so her neck is leaning on the sink (such as at the salon) or armrest on the couch, she develops spinning sensation and sensation she is going to pass out.  It does not occur if she lays back down on a flat surface.  CTA  of head and neck from 06/18/16 were personally reviewed and did not reveal vertebrobasilar insufficiency.   HISTORY: Onset:  2015-2016 Location:  Right sided, around the right eye radiating to the temple and maxilla Quality:  aching Intensity:  7/10 Aura:  no Prodrome:  no Associated symptoms:  Nausea, right ptosis, photophobia, some tingling around the eye and maxilla.  No visual disturbance Duration:  Gradual onset for several hours later in the day.  Sometimes wakes her up at night Frequency:  2 days per week Triggers/exacerbating factors:  cold, jaw pain Relieving factors:  Heating pad on jaw Activity:  Usually able to function.   She has remote history of migraines up until her 37s or 70s.  These headaches are much different.  She was evaluated by her dentist who found nothing wrong.  She has been told it was trigeminal neuralgia and  was previously treated with low dose of gabapentin, which was ineffective.  MRI of brain with and without contrast from 01/23/16 was personally revealed and revealed no acute abnormality but did demonstrate chronic right frontal infarct.    Sed Rate 35, CRP negative  PAST MEDICAL HISTORY: Past Medical History:  Diagnosis Date  . Allergic rhinitis   . Diabetes mellitus   . History of recurrent UTIs    [name]  . HTN (hypertension)   . Hyperlipidemia   . Migraine headache    quiescent  . Sleep disorder    [name] May  . TIA (transient ischemic attack)     MEDICATIONS: Current Outpatient Prescriptions on File Prior to Visit  Medication Sig Dispense Refill  . aspirin 81 MG tablet Take 81 mg by mouth daily.      [name] atorvastatin (LIPITOR) 40 MG tablet Take 1 tablet (40 mg total) by mouth daily at 6 PM. 90 tablet 1  . BD PEN NEEDLE NANO U/F 32G X 4 MM MISC USE ONCE A DAY AS DIRECTED 90 each 3  . Cholecalciferol (VITAMIN D3) 5000 UNITS CAPS Take 5,000 Units by mouth daily.     [name] desvenlafaxine (PRISTIQ) 100 MG 24 hr tablet Take 100 mg by mouth daily.    [name] gabapentin (NEURONTIN) 100 MG capsule Take 2 capsules (200 mg total) by mouth at bedtime. 60 capsule 3  . Insulin Glargine (BASAGLAR KWIKPEN) 100 UNIT/ML SOPN Inject 0.35 mLs (35 Units total) into the skin at bedtime. (Patient taking differently: Inject 30 Units into the skin at bedtime. )  15 mL 2  . Lancets (ONETOUCH ULTRASOFT) lancets Check blood sugar once daily. Dx code: 250.00 100 each 12  . LORazepam (ATIVAN) 0.5 MG tablet Take 0.5 mg by mouth 2 (two) times daily as needed. For anxiety.    [name] losartan (COZAAR) 100 MG tablet Take 1 tablet (100 mg total) by mouth daily. 90 tablet 1  . metroNIDAZOLE (METROGEL) 0.75 % gel Apply 1 application topically 2 (two) times daily. 45 g 0  . naproxen (NAPROSYN) 500 MG tablet Take 1 tablet (500 mg total) by mouth 2 (two) times daily with a meal. 60 tablet 3  . omeprazole (PRILOSEC) 20 MG capsule  TAKE 1 CAPSULE (20 MG TOTAL) BY MOUTH DAILY. 30 capsule 6  . ONE TOUCH ULTRA TEST test strip USE 1 STRIP TWICE DAILY DX CODE E11.65 100 each 2  . pioglitazone (ACTOS) 30 MG tablet TAKE 1 TABLET (30 MG TOTAL) BY MOUTH DAILY. 90 tablet 3  . traMADol (ULTRAM) 50 MG tablet Take 1 tablet (50 mg total) by mouth every 12 (twelve) hours. 60 tablet 0  . ondansetron (ZOFRAN) 4 MG tablet Take 1-2 tablets (4-8 mg total) by mouth every 8 (eight) hours as needed for nausea or vomiting. (Patient not taking: Reported on 12/21/2016) 60 tablet 0   No current facility-administered medications on file prior to visit.     ALLERGIES: Allergies  Allergen Reactions  . Vioxx [Rofecoxib]     Other reaction(s): Other (See Comments) Excess BP which caused TIA Excess BP which caused TIA  . Prednisone     REACTION:  Mental status changes No associated rash or fever  . Colesevelam     REACTION: Leg Pain    FAMILY HISTORY: Family History  Problem Relation Age of Onset  . Colon cancer Maternal Aunt   . Diabetes Paternal Uncle   . Heart attack Father 47  . COPD Mother   . Lung cancer Brother     smoker  . Mental illness      niece, committed suicide.  [name] Heart attack      brother X 2; @ 60 & 16  . Stroke Neg Hx     SOCIAL HISTORY: Social History   Social History  . Marital status: Married    Spouse name: N/A  . Number of children: N/A  . Years of education: N/A   Occupational History  . Building services engineer Staffing   Social History Main Topics  . Smoking status: Never Smoker  . Smokeless tobacco: Not on file  . Alcohol use No  . Drug use: No  . Sexual activity: Not on file   Other Topics Concern  . Not on file   Social History Narrative   Regular exercise- no     REVIEW OF SYSTEMS: Constitutional: No fevers, chills, or sweats, no generalized fatigue, change in appetite Eyes: No visual changes, double vision, eye pain Ear, nose and throat: No hearing loss, ear pain,  nasal congestion, sore throat Cardiovascular: No chest pain, palpitations Respiratory:  No shortness of breath at rest or with exertion, wheezes GastrointestinaI: No nausea, vomiting, diarrhea, abdominal pain, fecal incontinence Genitourinary:  No dysuria, urinary retention or frequency Musculoskeletal:  No neck pain, back pain Integumentary: No rash, pruritus, skin lesions Neurological: as above Psychiatric: No depression, insomnia, anxiety Endocrine: No palpitations, fatigue, diaphoresis, mood swings, change in appetite, change in weight, increased thirst Hematologic/Lymphatic:  No purpura, petechiae. Allergic/Immunologic: no itchy/runny eyes, nasal congestion, recent allergic reactions, rashes  PHYSICAL EXAM:  Vitals:   12/21/16 1135  BP: 112/62  Pulse: 82   General: No acute distress.  Patient appears well-groomed.  normal body habitus. Head:  Normocephalic/atraumatic Eyes:  Fundi examined but not visualized Neck: supple, no paraspinal tenderness, full range of motion Heart:  Regular rate and rhythm Lungs:  Clear to auscultation bilaterally Back: No paraspinal tenderness Neurological Exam: alert and oriented to person, place, and time. Attention span and concentration intact, recent and remote memory intact, fund of knowledge intact.  Speech fluent and not dysarthric, language intact.  CN II-XII intact. Bulk and tone normal, muscle strength 5/5 throughout.  Sensation to light touch  intact.  Deep tendon reflexes 2+ throughout.  Finger to nose testing intact.  Gait normal, Romberg negative.  IMPRESSION: Migraine Cerebrovascular disease  PLAN: 1.  Due to cerebrovascular disease, would not take triptan.  Instead, she was advised to take OTC (Tylenol, Advil, Excedrin) or tramadol for abortive care and promethazine for nausea. 2.  For cerebrovascular disease, continue ASA and statin therapy for hyperlipidemia (LDL goal less than 100) as managed by PCP 3.  Follow up in 6  months.  Metta Clines, DO  CC:  Pricilla Holm, MD

## 2016-12-21 NOTE — Telephone Encounter (Signed)
Pt noticed that she had an appointment with Dr. Sharlet Salina for tomorrow.  This should've been scheduled with Dr. Tamala Julian. His next available was on 1/22 and I got her scheduled.  She hates to wait that long and is wondering if you can squeeze her in anytime sooner. She has an appointment with Dr. Sharlet Salina thurs afternoon.

## 2016-12-21 NOTE — Telephone Encounter (Signed)
Please resubmit basaglar to pharmacy stated they didn't receive it.  [id number] [address], [place]. [phone] (Phone) [phone] (Fax)

## 2016-12-21 NOTE — Patient Instructions (Signed)
1.  Stop sumatriptan.  When you get a headache, take over the counter medication (Tylenol, Excedrin, ibuprofen) or tramadol at earliest onset of headache.  May take promethazine as well for nausea. 2.  Follow up in 6 months.

## 2016-12-21 NOTE — Telephone Encounter (Signed)
Spoke to pt, scheduled her for 130 on Thursday.

## 2016-12-21 NOTE — Telephone Encounter (Signed)
Refill submitted. 

## 2016-12-22 ENCOUNTER — Other Ambulatory Visit: Payer: Self-pay | Admitting: Endocrinology

## 2016-12-22 ENCOUNTER — Ambulatory Visit: Payer: Medicare Other | Admitting: Internal Medicine

## 2016-12-24 ENCOUNTER — Ambulatory Visit (INDEPENDENT_AMBULATORY_CARE_PROVIDER_SITE_OTHER): Payer: Medicare Other | Admitting: Internal Medicine

## 2016-12-24 ENCOUNTER — Encounter: Payer: Self-pay | Admitting: Internal Medicine

## 2016-12-24 ENCOUNTER — Encounter: Payer: Self-pay | Admitting: Family Medicine

## 2016-12-24 ENCOUNTER — Ambulatory Visit (INDEPENDENT_AMBULATORY_CARE_PROVIDER_SITE_OTHER): Payer: Medicare Other | Admitting: Family Medicine

## 2016-12-24 VITALS — BP 128/72 | HR 80 | Ht 66.5 in

## 2016-12-24 VITALS — BP 148/72 | HR 86 | Temp 98.2°F | Resp 14 | Ht 66.5 in | Wt 187.0 lb

## 2016-12-24 DIAGNOSIS — Z0000 Encounter for general adult medical examination without abnormal findings: Secondary | ICD-10-CM | POA: Diagnosis not present

## 2016-12-24 DIAGNOSIS — M5416 Radiculopathy, lumbar region: Secondary | ICD-10-CM

## 2016-12-24 DIAGNOSIS — I10 Essential (primary) hypertension: Secondary | ICD-10-CM

## 2016-12-24 DIAGNOSIS — M999 Biomechanical lesion, unspecified: Secondary | ICD-10-CM | POA: Diagnosis not present

## 2016-12-24 DIAGNOSIS — E785 Hyperlipidemia, unspecified: Secondary | ICD-10-CM | POA: Diagnosis not present

## 2016-12-24 DIAGNOSIS — Z23 Encounter for immunization: Secondary | ICD-10-CM

## 2016-12-24 DIAGNOSIS — R110 Nausea: Secondary | ICD-10-CM

## 2016-12-24 DIAGNOSIS — K219 Gastro-esophageal reflux disease without esophagitis: Secondary | ICD-10-CM | POA: Diagnosis not present

## 2016-12-24 NOTE — Progress Notes (Signed)
   Subjective:    Patient ID: Christine [name], female    DOB: 1951/05/25, 66 y.o.   MRN: [id number]  HPI Here for medicare wellness, no new complaints. Please see A/P for status and treatment of chronic medical problems.   HPI #2: Here for follow up of medical conditions including cholesterol (takes lipitor daily, no side effects, controlling her cholesterol) and her GERD (taking omeprazole daily and controlling her symptoms, occasional nausea for which she has used zofran in the past, some GI bugs going around right now and she thinks she may have gotten) and her blood pressure (mildly high today due to skipping meds in case she needed labs, usually good at home and she checks, no side effects, taking losartan).   Diet: heart healthy Physical activity: sedentary Depression/mood screen: negative Hearing: intact to whispered voice Visual acuity: grossly normal, performs annual eye exam  ADLs: capable Fall risk: none Home safety: good Cognitive evaluation: intact to orientation, naming, recall and repetition EOL planning: adv directives discussed  I have personally reviewed and have noted 1. The patient's medical and social history - reviewed today no changes 2. Their use of alcohol, tobacco or illicit drugs 3. Their current medications and supplements 4. The patient's functional ability including ADL's, fall risks, home safety risks and hearing or visual impairment. 5. Diet and physical activities 6. Evidence for depression or mood disorders 7. Care team reviewed and updated (available in snapshot)  Review of Systems  Constitutional: Negative.   HENT: Negative.   Eyes: Negative.   Respiratory: Negative for cough, chest tightness and shortness of breath.   Cardiovascular: Negative for chest pain, palpitations and leg swelling.  Gastrointestinal: Positive for nausea. Negative for abdominal distention, abdominal pain, constipation, diarrhea and vomiting.  Musculoskeletal: Positive  for arthralgias. Negative for back pain, gait problem, myalgias and neck pain.  Skin: Negative.   Neurological: Negative.   Psychiatric/Behavioral: Negative.       Objective:   Physical Exam  Constitutional: She is oriented to person, place, and time. She appears well-developed and well-nourished.  Overweight  HENT:  Head: Normocephalic and atraumatic.  Eyes: EOM are normal.  Neck: Normal range of motion.  Cardiovascular: Normal rate and regular rhythm.   Pulmonary/Chest: Effort normal and breath sounds normal. No respiratory distress. She has no wheezes. She has no rales.  Abdominal: Soft. Bowel sounds are normal. She exhibits no distension. There is no tenderness. There is no rebound and no guarding.  Musculoskeletal: She exhibits no edema.  Neurological: She is alert and oriented to person, place, and time. Coordination normal.  Skin: Skin is warm and dry.  Psychiatric: She has a normal mood and affect.   Vitals:   12/24/16 1352  BP: (!) 148/72  Pulse: 86  Resp: 14  Temp: 98.2 F (36.8 C)  TempSrc: Oral  SpO2: 98%  Weight: 187 lb (84.8 kg)  Height: 5' 6.5" (1.689 m)      Assessment & Plan:  prevnar 13 given at visit.

## 2016-12-24 NOTE — Progress Notes (Signed)
[name] [address], [place] [address] Phone: [phone] Subjective:    CC: Neck and upper back pain follow-up  [id number]  Christine [name] is a 66 y.o. female coming in for follow-up of her neck pain. Patient states doing well, conservative therapy. Seems to be stable overall. Is having another migraine. When she does have migraines do have tightness in the neck.    Continues to have bilateral hip pain. Likely from the back. worsening on the left side. Patient was having more coming out but now is having severe pain at this time. Patient states that unfortunately it seems more constant than it has ever been. States that sometimes it feels like her left leg would potentially give out on her. This is new. States that he continues to wake her up at night when she rolls on that side. Negative good response to the last injections we did for a greater trochanteric bursitis.  X-rays the patient's lumbar spine were taken and had moderate to severe facet OA.  Independently visualized by me.     Past Medical History:  Diagnosis Date  . Allergic rhinitis   . Diabetes mellitus   . History of recurrent UTIs    [name]  . HTN (hypertension)   . Hyperlipidemia   . Migraine headache    quiescent  . Sleep disorder    [name] May  . TIA (transient ischemic attack)    Past Surgical History:  Procedure Laterality Date  . COLONOSCOPY     Dr Carlean [name]; hemorrhoids  . CORONARY ANGIOPLASTY  1997   for chest pain- negative   . POLYPECTOMY  2002   benign, hyperplastic polyp; rectal bleeding 2008  . TONSILLECTOMY    . TOTAL ABDOMINAL HYSTERECTOMY  1983   BSO for endometriosis  . WISDOM TOOTH EXTRACTION     Social History   Social History  . Marital status: Married    Spouse name: N/A  . Number of children: N/A  . Years of education: N/A   Occupational History  . Building services engineer Staffing   Social History Main Topics    . Smoking status: Never Smoker  . Smokeless tobacco: Never Used  . Alcohol use No  . Drug use: No  . Sexual activity: Not on file   Other Topics Concern  . Not on file   Social History Narrative   Regular exercise- no    Allergies  Allergen Reactions  . Vioxx [Rofecoxib]     Other reaction(s): Other (See Comments) Excess BP which caused TIA Excess BP which caused TIA  . Prednisone     REACTION:  Mental status changes No associated rash or fever  . Colesevelam     REACTION: Leg Pain   Family History  Problem Relation Age of Onset  . Colon cancer Maternal Aunt   . Diabetes Paternal Uncle   . Heart attack Father 19  . COPD Mother   . Lung cancer Brother     smoker  . Mental illness      niece, committed suicide.  [name] Heart attack      brother X 2; @ 71 & 75  . Stroke Neg Hx     Past medical history, social, surgical and family history all reviewed in electronic medical record.   Review of Systems: No nausea, vomiting, diarrhea, constipation, dizziness, abdominal pain, skin rash, fevers, chills, night sweats, weight loss, swollen lymph nodes,  chest pain, shortness of  breath, mood changes.    Objective  There were no vitals taken for this visit.  Systems examined below as of 12/25/16 General: NAD A&O x3 mood, affect normal  HEENT: Pupils equal, extraocular movements intact no nystagmus Respiratory: not short of breath at rest or with speaking Cardiovascular: No lower extremity edema, non tender Skin: Warm dry intact with no signs of infection or rash on extremities or on axial skeleton. Abdomen: Soft nontender, no masses Neuro: Cranial nerves  intact, neurovascularly intact in all extremities with 2+ DTRs and 2+ pulses. Lymph: No lymphadenopathy appreciated today  Gait normal with good balance and coordination.  MSK: Non tender with full range of motion and good stability and symmetric strength and tone of shoulders, elbows, wrist,  knee hips and ankles  bilaterally.    Back Exam:  Inspection: Unremarkable  Motion: Flexion 25 deg worsening symptoms with radicular symptoms going on the left leg. , Extension 20 deg, Side Bending to 30 deg bilaterally,  Rotation to 30 deg bilaterally  SLR laying: Positive left side XSLR laying: Negative  Palpable tenderness: Severe tightness in the per spinal musculature of the lumbar spine FABER: . Tightness bilaterally. Left greater than right Sensory change: Gross sensation intact to all lumbar and sacral dermatomes.  Reflexes: 2+ at both patellar tendons, 2+ at achilles tendons, Babinski's downgoing.  Strength at foot  Patient is 4 out of 5 strength of dorsiflexion on the left side compared to the right side. This is new finding from previous exam.   Neck: Inspection unremarkable. No palpable stepoffs. Negative Spurling's maneuver. Mild increase in stiffness actively but still has full range of motion passively Grip strength and sensation normal in bilateral hands Strength good C4 to T1 distribution No sensory change to C4 to T1 Negative Hoffman sign bilaterally Reflexes normal    OMT Physical Exam  Cervical  C2 flexed rotated and side bent right C6 flexed rotated and side bent left  Thoracic T1 extended rotated and side bent right with elevated first rib T5 extended rotated and side bent left T7 extended rotated and side bent right      Impression and Recommendations:     This case required medical decision making of moderate complexity.

## 2016-12-24 NOTE — Patient Instructions (Signed)
Good to see you  We will get MRI of the lumbar spine I am concern for spinal stenosis based on your symptoms I will write you on my chart when we get it back  See me again in 4 weeks otherwise.

## 2016-12-24 NOTE — Progress Notes (Signed)
Pre visit review using our clinic review tool, if applicable. No additional management support is needed unless otherwise documented below in the visit note. 

## 2016-12-24 NOTE — Patient Instructions (Signed)
We did give you the pneumonia shot today.  We do not need blood work.   Health Maintenance, Female Introduction Adopting a healthy lifestyle and getting preventive care can go a long way to promote health and wellness. Talk with your health care provider about what schedule of regular examinations is right for you. This is a good chance for you to check in with your provider about disease prevention and staying healthy. In between checkups, there are plenty of things you can do on your own. Experts have done a lot of research about which lifestyle changes and preventive measures are most likely to keep you healthy. Ask your health care provider for more information. Weight and diet Eat a healthy diet  Be sure to include plenty of vegetables, fruits, low-fat dairy products, and lean protein.  Do not eat a lot of foods high in solid fats, added sugars, or salt.  Get regular exercise. This is one of the most important things you can do for your health.  Most adults should exercise for at least 150 minutes each week. The exercise should increase your heart rate and make you sweat (moderate-intensity exercise).  Most adults should also do strengthening exercises at least twice a week. This is in addition to the moderate-intensity exercise. Maintain a healthy weight  Body mass index (BMI) is a measurement that can be used to identify possible weight problems. It estimates body fat based on height and weight. Your health care provider can help determine your BMI and help you achieve or maintain a healthy weight.  For females 25 years of age and older:  A BMI below 18.5 is considered underweight.  A BMI of 18.5 to 24.9 is normal.  A BMI of 25 to 29.9 is considered overweight.  A BMI of 30 and above is considered obese. Watch levels of cholesterol and blood lipids  You should start having your blood tested for lipids and cholesterol at 68 years of age, then have this test every 5  years.  You may need to have your cholesterol levels checked more often if:  Your lipid or cholesterol levels are high.  You are older than 68 years of age.  You are at high risk for heart disease. Cancer screening Lung Cancer  Lung cancer screening is recommended for adults 37-68 years old who are at high risk for lung cancer because of a history of smoking.  A yearly low-dose CT scan of the lungs is recommended for people who:  Currently smoke.  Have quit within the past 15 years.  Have at least a 30-pack-year history of smoking. A pack year is smoking an average of one pack of cigarettes a day for 1 year.  Yearly screening should continue until it has been 15 years since you quit.  Yearly screening should stop if you develop a health problem that would prevent you from having lung cancer treatment. Breast Cancer  Practice breast self-awareness. This means understanding how your breasts normally appear and feel.  It also means doing regular breast self-exams. Let your health care provider know about any changes, no matter how small.  If you are in your 20s or 30s, you should have a clinical breast exam (CBE) by a health care provider every 1-3 years as part of a regular health exam.  If you are 8 or older, have a CBE every year. Also consider having a breast X-ray (mammogram) every year.  If you have a family history of breast cancer, talk to  your health care provider about genetic screening.  If you are at high risk for breast cancer, talk to your health care provider about having an MRI and a mammogram every year.  Breast cancer gene (BRCA) assessment is recommended for women who have family members with BRCA-related cancers. BRCA-related cancers include:  Breast.  Ovarian.  Tubal.  Peritoneal cancers.  Results of the assessment will determine the need for genetic counseling and BRCA1 and BRCA2 testing. Cervical Cancer  Your health care provider may recommend  that you be screened regularly for cancer of the pelvic organs (ovaries, uterus, and vagina). This screening involves a pelvic examination, including checking for microscopic changes to the surface of your cervix (Pap test). You may be encouraged to have this screening done every 3 years, beginning at age 68.  For women ages 47-65, health care providers may recommend pelvic exams and Pap testing every 3 years, or they may recommend the Pap and pelvic exam, combined with testing for human papilloma virus (HPV), every 5 years. Some types of HPV increase your risk of cervical cancer. Testing for HPV may also be done on women of any age with unclear Pap test results.  Other health care providers may not recommend any screening for nonpregnant women who are considered low risk for pelvic cancer and who do not have symptoms. Ask your health care provider if a screening pelvic exam is right for you.  If you have had past treatment for cervical cancer or a condition that could lead to cancer, you need Pap tests and screening for cancer for at least 20 years after your treatment. If Pap tests have been discontinued, your risk factors (such as having a new sexual partner) need to be reassessed to determine if screening should resume. Some women have medical problems that increase the chance of getting cervical cancer. In these cases, your health care provider may recommend more frequent screening and Pap tests. Colorectal Cancer  This type of cancer can be detected and often prevented.  Routine colorectal cancer screening usually begins at 68 years of age and continues through 68 years of age.  Your health care provider may recommend screening at an earlier age if you have risk factors for colon cancer.  Your health care provider may also recommend using home test kits to check for hidden blood in the stool.  A small camera at the end of a tube can be used to examine your colon directly (sigmoidoscopy or  colonoscopy). This is done to check for the earliest forms of colorectal cancer.  Routine screening usually begins at age 24.  Direct examination of the colon should be repeated every 5-10 years through 68 years of age. However, you may need to be screened more often if early forms of precancerous polyps or small growths are found. Skin Cancer  Check your skin from head to toe regularly.  Tell your health care provider about any new moles or changes in moles, especially if there is a change in a mole's shape or color.  Also tell your health care provider if you have a mole that is larger than the size of a pencil eraser.  Always use sunscreen. Apply sunscreen liberally and repeatedly throughout the day.  Protect yourself by wearing long sleeves, pants, a wide-brimmed hat, and sunglasses whenever you are outside. Heart disease, diabetes, and high blood pressure  High blood pressure causes heart disease and increases the risk of stroke. High blood pressure is more likely to develop in:  People who have blood pressure in the high end of the normal range (130-139/85-89 mm Hg).  People who are overweight or obese.  People who are African American.  If you are 60-50 years of age, have your blood pressure checked every 3-5 years. If you are 33 years of age or older, have your blood pressure checked every year. You should have your blood pressure measured twice-once when you are at a hospital or clinic, and once when you are not at a hospital or clinic. Record the average of the two measurements. To check your blood pressure when you are not at a hospital or clinic, you can use:  An automated blood pressure machine at a pharmacy.  A home blood pressure monitor.  If you are between 27 years and 42 years old, ask your health care provider if you should take aspirin to prevent strokes.  Have regular diabetes screenings. This involves taking a blood sample to check your fasting blood sugar  level.  If you are at a normal weight and have a low risk for diabetes, have this test once every three years after 68 years of age.  If you are overweight and have a high risk for diabetes, consider being tested at a younger age or more often. Preventing infection Hepatitis B  If you have a higher risk for hepatitis B, you should be screened for this virus. You are considered at high risk for hepatitis B if:  You were born in a country where hepatitis B is common. Ask your health care provider which countries are considered high risk.  Your parents were born in a high-risk country, and you have not been immunized against hepatitis B (hepatitis B vaccine).  You have HIV or AIDS.  You use needles to inject street drugs.  You live with someone who has hepatitis B.  You have had sex with someone who has hepatitis B.  You get hemodialysis treatment.  You take certain medicines for conditions, including cancer, organ transplantation, and autoimmune conditions. Hepatitis C  Blood testing is recommended for:  Everyone born from [birth year] through 1967.  Anyone with known risk factors for hepatitis C. Sexually transmitted infections (STIs)  You should be screened for sexually transmitted infections (STIs) including gonorrhea and chlamydia if:  You are sexually active and are younger than 68 years of age.  You are older than 68 years of age and your health care provider tells you that you are at risk for this type of infection.  Your sexual activity has changed since you were last screened and you are at an increased risk for chlamydia or gonorrhea. Ask your health care provider if you are at risk.  If you do not have HIV, but are at risk, it may be recommended that you take a prescription medicine daily to prevent HIV infection. This is called pre-exposure prophylaxis (PrEP). You are considered at risk if:  You are sexually active and do not regularly use condoms or know the HIV status  of your partner(s).  You take drugs by injection.  You are sexually active with a partner who has HIV. Talk with your health care provider about whether you are at high risk of being infected with HIV. If you choose to begin PrEP, you should first be tested for HIV. You should then be tested every 3 months for as long as you are taking PrEP. Pregnancy  If you are premenopausal and you may become pregnant, ask your health care provider about  preconception counseling.  If you may become pregnant, take 400 to 800 micrograms (mcg) of folic acid every day.  If you want to prevent pregnancy, talk to your health care provider about birth control (contraception). Osteoporosis and menopause  Osteoporosis is a disease in which the bones lose minerals and strength with aging. This can result in serious bone fractures. Your risk for osteoporosis can be identified using a bone density scan.  If you are 65 years of age or older, or if you are at risk for osteoporosis and fractures, ask your health care provider if you should be screened.  Ask your health care provider whether you should take a calcium or vitamin D supplement to lower your risk for osteoporosis.  Menopause may have certain physical symptoms and risks.  Hormone replacement therapy may reduce some of these symptoms and risks. Talk to your health care provider about whether hormone replacement therapy is right for you. Follow these instructions at home:  Schedule regular health, dental, and eye exams.  Stay current with your immunizations.  Do not use any tobacco products including cigarettes, chewing tobacco, or electronic cigarettes.  If you are pregnant, do not drink alcohol.  If you are breastfeeding, limit how much and how often you drink alcohol.  Limit alcohol intake to no more than 1 drink per day for nonpregnant women. One drink equals 12 ounces of beer, 5 ounces of wine, or 1 ounces of hard liquor.  Do not use street  drugs.  Do not share needles.  Ask your health care provider for help if you need support or information about quitting drugs.  Tell your health care provider if you often feel depressed.  Tell your health care provider if you have ever been abused or do not feel safe at home. This information is not intended to replace advice given to you by your health care provider. Make sure you discuss any questions you have with your health care provider. Document Released: 06/15/2011 Document Revised: 05/07/2016 Document Reviewed: 09/03/2015  2017 Elsevier  

## 2016-12-25 ENCOUNTER — Encounter: Payer: Self-pay | Admitting: Internal Medicine

## 2016-12-25 DIAGNOSIS — K219 Gastro-esophageal reflux disease without esophagitis: Secondary | ICD-10-CM

## 2016-12-25 HISTORY — DX: Gastro-esophageal reflux disease without esophagitis: K21.9

## 2016-12-25 NOTE — Assessment & Plan Note (Signed)
Worsening symptoms at this time. Patient is having a positive straight leg test mild weakness and has not been noted previously. Patient is had this pain chronically and intermittently for multiple months if not years at this time. Patient though recently seem to be getting worse and with the weakness I do feel that advance imaging is warranted. Patient would be a candidate for epidural injections. Depending on findings we'll discuss further management.

## 2016-12-25 NOTE — Assessment & Plan Note (Signed)
Given prevnar 13 to start pneumonia series, flu and tetanus up to date. Declines shingles. Mammogram and colonoscopy up to date. Hep c screening complete. Counseled on the need for exercise and sun safety as well as mole surveillance. Given 10 year screening recommendations.

## 2016-12-25 NOTE — Assessment & Plan Note (Signed)
Decision today to treat with OMT was based on Physical Exam  After verbal consent patient was treated with HVLA, ME, FPR techniques in rib, thoracic, crevical  areas  Patient tolerated the procedure well with improvement in symptoms  Patient given exercises, stretches and lifestyle modifications  See medications in patient instructions if given  Patient will follow up in 3 weeks Avoided patient's lumbar region at this point due to the radicular symptoms.

## 2016-12-25 NOTE — Assessment & Plan Note (Signed)
Taking losartan daily for BP and slightly high today but at goal at home with meds. Recent CMP normal and reviewed with her at visit.

## 2016-12-25 NOTE — Assessment & Plan Note (Signed)
Lipitor 40 mg daily, recent lipid panel reviewed with her during the visit and no indication for change.

## 2016-12-25 NOTE — Assessment & Plan Note (Signed)
Taking omeprazole and rx for zofran today for the likely viral illness.

## 2017-01-04 ENCOUNTER — Ambulatory Visit: Payer: Medicare Other | Admitting: Family Medicine

## 2017-01-05 ENCOUNTER — Ambulatory Visit
Admission: RE | Admit: 2017-01-05 | Discharge: 2017-01-05 | Disposition: A | Discharge status: Home / Self Care | Payer: Medicare Other | Source: Ambulatory Visit | Attending: Family Medicine | Admitting: Family Medicine

## 2017-01-05 ENCOUNTER — Encounter: Payer: Self-pay | Admitting: Family Medicine

## 2017-01-05 DIAGNOSIS — M5126 Other intervertebral disc displacement, lumbar region: Secondary | ICD-10-CM | POA: Diagnosis not present

## 2017-01-05 DIAGNOSIS — M797 Fibromyalgia: Secondary | ICD-10-CM

## 2017-01-05 DIAGNOSIS — M5416 Radiculopathy, lumbar region: Secondary | ICD-10-CM

## 2017-01-05 DIAGNOSIS — E559 Vitamin D deficiency, unspecified: Secondary | ICD-10-CM

## 2017-01-05 DIAGNOSIS — I10 Essential (primary) hypertension: Secondary | ICD-10-CM

## 2017-01-06 ENCOUNTER — Other Ambulatory Visit: Payer: Self-pay | Admitting: Internal Medicine

## 2017-01-07 ENCOUNTER — Other Ambulatory Visit (INDEPENDENT_AMBULATORY_CARE_PROVIDER_SITE_OTHER): Payer: Medicare Other

## 2017-01-07 ENCOUNTER — Other Ambulatory Visit: Payer: Self-pay

## 2017-01-07 ENCOUNTER — Telehealth: Payer: Self-pay | Admitting: Internal Medicine

## 2017-01-07 DIAGNOSIS — I10 Essential (primary) hypertension: Secondary | ICD-10-CM

## 2017-01-07 DIAGNOSIS — M5416 Radiculopathy, lumbar region: Secondary | ICD-10-CM

## 2017-01-07 DIAGNOSIS — M797 Fibromyalgia: Secondary | ICD-10-CM | POA: Diagnosis not present

## 2017-01-07 DIAGNOSIS — E559 Vitamin D deficiency, unspecified: Secondary | ICD-10-CM | POA: Diagnosis not present

## 2017-01-07 LAB — CBC WITH DIFFERENTIAL/PLATELET
BASOS ABS: 0 10*3/uL (ref 0.0–0.1)
BASOS PCT: 0.8 % (ref 0.0–3.0)
EOS ABS: 0.1 10*3/uL (ref 0.0–0.7)
Eosinophils Relative: 1.5 % (ref 0.0–5.0)
HEMATOCRIT: 38.6 % (ref 36.0–46.0)
Hemoglobin: 13.1 g/dL (ref 12.0–15.0)
LYMPHS ABS: 1.8 10*3/uL (ref 0.7–4.0)
LYMPHS PCT: 34 % (ref 12.0–46.0)
MCHC: 33.8 g/dL (ref 30.0–36.0)
MCV: 87 fl (ref 78.0–100.0)
MONO ABS: 0.4 10*3/uL (ref 0.1–1.0)
Monocytes Relative: 7 % (ref 3.0–12.0)
NEUTROS ABS: 3 10*3/uL (ref 1.4–7.7)
NEUTROS PCT: 56.7 % (ref 43.0–77.0)
PLATELETS: 221 10*3/uL (ref 150.0–400.0)
RBC: 4.44 Mil/uL (ref 3.87–5.11)
RDW: 14.4 % (ref 11.5–15.5)
WBC: 5.3 10*3/uL (ref 4.0–10.5)

## 2017-01-07 LAB — C-REACTIVE PROTEIN

## 2017-01-07 LAB — COMPREHENSIVE METABOLIC PANEL
ALT: 16 U/L (ref 0–35)
AST: 14 U/L (ref 0–37)
Albumin: 4.1 g/dL (ref 3.5–5.2)
Alkaline Phosphatase: 57 U/L (ref 39–117)
BILIRUBIN TOTAL: 0.4 mg/dL (ref 0.2–1.2)
BUN: 18 mg/dL (ref 6–23)
CHLORIDE: 103 meq/L (ref 96–112)
CO2: 28 meq/L (ref 19–32)
Calcium: 9.4 mg/dL (ref 8.4–10.5)
Creatinine, Ser: 0.76 mg/dL (ref 0.40–1.20)
GFR: 80.53 mL/min (ref >60.00)
GLUCOSE: 154 mg/dL — AB (ref 70–99)
Potassium: 4.1 mEq/L (ref 3.5–5.1)
SODIUM: 138 meq/L (ref 135–145)
Total Protein: 6.9 g/dL (ref 6.0–8.3)

## 2017-01-07 LAB — SEDIMENTATION RATE: SED RATE: 15 mm/h (ref 0–30)

## 2017-01-07 LAB — IBC PANEL
Iron: 83 ug/dL (ref 42–145)
SATURATION RATIOS: 24.1 % (ref 20.0–50.0)
Transferrin: 246 mg/dL (ref 212.0–360.0)

## 2017-01-07 LAB — VITAMIN D 25 HYDROXY (VIT D DEFICIENCY, FRACTURES): VITD: 21.42 ng/mL — ABNORMAL LOW (ref 30.00–100.00)

## 2017-01-07 LAB — TSH: TSH: 2.23 u[IU]/mL (ref 0.35–4.50)

## 2017-01-07 NOTE — Telephone Encounter (Signed)
Discussed with pt

## 2017-01-07 NOTE — Telephone Encounter (Signed)
Pt called in and has questions about instructions that Dr Tamala Julian left her on my chart   Best number [phone]

## 2017-01-07 NOTE — Telephone Encounter (Signed)
Will have patient do labs and epidural L3-4 for diagnostic and potential therapuetic purposes.

## 2017-01-08 LAB — ANGIOTENSIN CONVERTING ENZYME: Angiotensin-Converting Enzyme: 31 U/L (ref 9–67)

## 2017-01-08 LAB — ANA: Anti Nuclear Antibody(ANA): NEGATIVE

## 2017-01-08 LAB — RHEUMATOID FACTOR: Rhuematoid fact SerPl-aCnc: <14 IU/mL (ref <14)

## 2017-01-08 LAB — CYCLIC CITRUL PEPTIDE ANTIBODY, IGG

## 2017-01-11 ENCOUNTER — Telehealth: Payer: Self-pay | Admitting: Endocrinology

## 2017-01-11 NOTE — Telephone Encounter (Signed)
Call if cbg goes over 200

## 2017-01-11 NOTE — Telephone Encounter (Signed)
See message and please advise, Thanks!  

## 2017-01-11 NOTE — Telephone Encounter (Signed)
I contacted the patient and advised of message. She voiced understanding and had no further questions at this time.  

## 2017-01-11 NOTE — Telephone Encounter (Signed)
Pt is having a steriod shot tomorrow, knows the BS are going to rise, please advise on what she should do.

## 2017-01-12 ENCOUNTER — Ambulatory Visit
Admission: RE | Admit: 2017-01-12 | Discharge: 2017-01-12 | Disposition: A | Discharge status: Home / Self Care | Payer: Medicare Other | Source: Ambulatory Visit | Attending: Family Medicine | Admitting: Family Medicine

## 2017-01-12 DIAGNOSIS — M25551 Pain in right hip: Secondary | ICD-10-CM | POA: Diagnosis not present

## 2017-01-12 DIAGNOSIS — M25552 Pain in left hip: Secondary | ICD-10-CM | POA: Diagnosis not present

## 2017-01-12 DIAGNOSIS — M5416 Radiculopathy, lumbar region: Secondary | ICD-10-CM

## 2017-01-12 MED ORDER — METHYLPREDNISOLONE ACETATE 40 MG/ML INJ SUSP (RADIOLOG
120.0000 mg | Freq: Once | INTRAMUSCULAR | Status: AC
  Administered 2017-01-12: 120 mg via EPIDURAL

## 2017-01-12 MED ORDER — IOPAMIDOL (ISOVUE-M 200) INJECTION 41%
1.0000 mL | Freq: Once | INTRAMUSCULAR | Status: AC
  Administered 2017-01-12: 1 mL via EPIDURAL

## 2017-01-12 NOTE — Discharge Instructions (Signed)

## 2017-01-15 ENCOUNTER — Encounter: Payer: Self-pay | Admitting: Endocrinology

## 2017-01-18 ENCOUNTER — Other Ambulatory Visit: Payer: Self-pay | Admitting: Internal Medicine

## 2017-01-25 DIAGNOSIS — R457 State of emotional shock and stress, unspecified: Secondary | ICD-10-CM | POA: Diagnosis not present

## 2017-01-26 ENCOUNTER — Ambulatory Visit (INDEPENDENT_AMBULATORY_CARE_PROVIDER_SITE_OTHER): Payer: Medicare Other | Admitting: Family Medicine

## 2017-01-26 ENCOUNTER — Encounter: Payer: Self-pay | Admitting: Family Medicine

## 2017-01-26 VITALS — BP 140/90 | HR 88 | Ht 66.0 in | Wt 183.0 lb

## 2017-01-26 DIAGNOSIS — M5416 Radiculopathy, lumbar region: Secondary | ICD-10-CM | POA: Diagnosis not present

## 2017-01-26 DIAGNOSIS — M999 Biomechanical lesion, unspecified: Secondary | ICD-10-CM

## 2017-01-26 MED ORDER — VITAMIN D (ERGOCALCIFEROL) 1.25 MG (50000 UNIT) PO CAPS: 50000.0000 [IU] | ORAL_CAPSULE | ORAL | 0 refills | Status: DC

## 2017-01-26 NOTE — Patient Instructions (Signed)
I am so happy you are feeling better Vitamin D is sent in AGAIN  I think we should do this for 6 weeks then re-asses with another lab draw.  See me again after your trip!

## 2017-01-26 NOTE — Progress Notes (Signed)
Christine [name] [address], [place] [address] Phone: [phone] Subjective:    CC: Neck and upper back pain follow-up  [id number]  Christine [name] is a 66 y.o. female coming in for follow-up of her neck pain. Patient states doing well, conservative therapy. Seems to be stable overall. Is having another migraine. When she does have migraines do have tightness in the neck.   Update 01/26/17 Continues to have bilateral hip pain. It appears the patient is having more radicular symptoms. Patient did have an MRI of the lumbar spine. MRI was independently visualized by me. Patient's MRI did show that patient did have mild to moderate facet arthritis but no true nerve impingement. Patient elected try an epidural. This was done on 01/12/2017. Patient states Doing much better at this time. No more radicular symptoms. Patient states that there is some mild discomfort in the back but nothing severe. Patient has been able to do significant warned daily activities. Very happy with the results.     Past Medical History:  Diagnosis Date  . Allergic rhinitis   . Diabetes mellitus   . History of recurrent UTIs    Dr Christine [name]  . HTN (hypertension)   . Hyperlipidemia   . Migraine headache    quiescent  . Sleep disorder    Dr Christine [name]  . TIA (transient ischemic attack)    Past Surgical History:  Procedure Laterality Date  . COLONOSCOPY     Dr Christine [name]; hemorrhoids  . CORONARY ANGIOPLASTY  1997   for chest pain- negative   . POLYPECTOMY  2002   benign, hyperplastic polyp; rectal bleeding 2008  . TONSILLECTOMY    . TOTAL ABDOMINAL HYSTERECTOMY  1983   BSO for endometriosis  . WISDOM TOOTH EXTRACTION     Social History   Social History  . Marital status: Married    Spouse name: N/A  . Number of children: N/A  . Years of education: N/A   Occupational History  . Building services engineer Staffing   Social History Main Topics  . Smoking  status: Never Smoker  . Smokeless tobacco: Never Used  . Alcohol use No  . Drug use: No  . Sexual activity: Not on file   Other Topics Concern  . Not on file   Social History Narrative   Regular exercise- no    Allergies  Allergen Reactions  . Vioxx [Rofecoxib]     Other reaction(s): Other (See Comments) Excess BP which caused TIA Excess BP which caused TIA  . Prednisone Other (See Comments)    Mental status changes No associated rash or fever  . Colesevelam Other (See Comments)    Leg Pain   Family History  Problem Relation Age of Onset  . Colon cancer Maternal Aunt   . Diabetes Paternal Uncle   . Heart attack Father 45  . COPD Mother   . Lung cancer Brother     smoker  . Mental illness      niece, committed suicide.  [name] Heart attack      brother X 2; @ 22 & 27  . Stroke Neg Hx     Past medical history, social, surgical and family history all reviewed in electronic medical record.   Review of Systems: No headache, visual changes, nausea, vomiting, diarrhea, constipation, dizziness, abdominal pain, skin rash, fevers, chills, night sweats, weight loss, swollen lymph nodes,chest pain, shortness of breath, mood changes.     Objective  There  were no vitals taken for this visit.  Systems examined below as of 01/26/17 General: NAD A&O x3 mood, affect normal  HEENT: Pupils equal, extraocular movements intact no nystagmus Respiratory: not short of breath at rest or with speaking Cardiovascular: No lower extremity edema, non tender Skin: Warm dry intact with no signs of infection or rash on extremities or on axial skeleton. Abdomen: Soft nontender, no masses Neuro: Cranial nerves  intact, neurovascularly intact in all extremities with 2+ DTRs and 2+ pulses. Lymph: No lymphadenopathy appreciated today  Gait normal with good balance and coordination.  MSK: Non tender with full range of motion and good stability and symmetric strength and tone of shoulders, elbows, wrist,   knee hips and ankles bilaterally.  Mild arthritic changes of multiple joints Back Exam:  Inspection: Unremarkable  Motion: Flexion 45 deg, Extension 45 deg, Side Bending to 45 deg bilaterally,  Rotation to 45 deg bilaterally  SLR laying: Negative  XSLR laying: Negative  Palpable tenderness: Very mild tenderness in the paraspinal musculature of the lumbar spine as well as on bilateral hip still. Significant improvement of. FABER: negative. Sensory change: Gross sensation intact to all lumbar and sacral dermatomes.  Reflexes: 2+ at both patellar tendons, 2+ at achilles tendons, Babinski's downgoing.  Strength at foot  Plantar-flexion: 5/5 Dorsi-flexion: 5/5 Eversion: 5/5 Inversion: 5/5  Leg strength  Quad: 5/5 Hamstring: 5/5 Hip flexor: 5/5 Hip abductors: 5/5  Gait unremarkable.   Neck: Inspection unremarkable. No palpable stepoffs. Negative Spurling's maneuver. Continued stiffness mostly on the left side of the neck Grip strength and sensation normal in bilateral hands Strength good C4 to T1 distribution No sensory change to C4 to T1 Negative Hoffman sign bilaterally Reflexes normal    Osteopathic findings Cervical C2 flexed rotated and side bent right C4 flexed rotated and side bent left C6 flexed rotated and side bent left T3 extended rotated and side bent right inhaled third rib T9 extended rotated and side bent left L2 flexed rotated and side bent right Sacrum right on right      Impression and Recommendations:     This case required medical decision making of moderate complexity.

## 2017-01-26 NOTE — Assessment & Plan Note (Signed)
Patient did respond well to epidural 01/12/2017. Encourage her to continue to work on core stability exercises. Patient has been given these multiple times. We discussed icing regimen and home exercises. Discussed which activities to do in which ones to avoid. Patient has responded well to osteopathic manipulation otherwise will continue this. Follow-up again in 3-4 weeks.

## 2017-01-26 NOTE — Assessment & Plan Note (Signed)
Decision today to treat with OMT was based on Physical Exam  After verbal consent patient was treated with HVLA, ME, FPR techniques in cervical, thoracic, lumbar and sacral areas  Patient tolerated the procedure well with improvement in symptoms  Patient given exercises, stretches and lifestyle modifications  See medications in patient instructions if given  Patient will follow up in 3-4 weeks  

## 2017-02-01 ENCOUNTER — Other Ambulatory Visit: Payer: Self-pay | Admitting: Internal Medicine

## 2017-02-01 ENCOUNTER — Telehealth: Payer: Self-pay | Admitting: Family Medicine

## 2017-02-01 DIAGNOSIS — S92515A Nondisplaced fracture of proximal phalanx of left lesser toe(s), initial encounter for closed fracture: Secondary | ICD-10-CM | POA: Diagnosis not present

## 2017-02-01 NOTE — Telephone Encounter (Signed)
How about tomorrow.

## 2017-02-01 NOTE — Telephone Encounter (Signed)
Spoke to pt, she stated she went to [facility].

## 2017-02-01 NOTE — Telephone Encounter (Signed)
Patient believes she might have broke her little toe on her left foot.  Wants to be seen today but does not want to go to another location of ours.  If there are any cancellations today please call.

## 2017-02-25 ENCOUNTER — Encounter: Payer: Self-pay | Admitting: Endocrinology

## 2017-02-25 ENCOUNTER — Ambulatory Visit (INDEPENDENT_AMBULATORY_CARE_PROVIDER_SITE_OTHER): Payer: Medicare Other | Admitting: Endocrinology

## 2017-02-25 VITALS — BP 132/84 | HR 96 | Ht 66.0 in | Wt 186.0 lb

## 2017-02-25 DIAGNOSIS — E1151 Type 2 diabetes mellitus with diabetic peripheral angiopathy without gangrene: Secondary | ICD-10-CM

## 2017-02-25 DIAGNOSIS — Z794 Long term (current) use of insulin: Secondary | ICD-10-CM

## 2017-02-25 LAB — POCT GLYCOSYLATED HEMOGLOBIN (HGB A1C): HEMOGLOBIN A1C: 6.7

## 2017-02-25 MED ORDER — BASAGLAR KWIKPEN 100 UNIT/ML ~~LOC~~ SOPN: 25.0000 [IU] | PEN_INJECTOR | SUBCUTANEOUS | 2 refills | Status: DC

## 2017-02-25 NOTE — Progress Notes (Signed)
Subjective:    Patient ID: Christine [name], female    DOB: Sep 14, 1951, 66 y.o.   MRN: [id number]  HPI Pt returns for f/u of diabetes mellitus: DM type: Insulin-requiring type 2 Dx'ed: 2878 Complications: TIA Therapy: insulin since 2014, and 2 oral meds.   GDM: never. DKA: never.  Severe hypoglycemia: never.  Pancreatitis: never.   Other: She declines multiple daily injections; pioglitizone is for NASH; a trial to convert insulin back to oral rx in 2017 was unsuccessful.  Interval history: pt states she feels well in general. she brings a record of her cbg's which i have reviewed today.  It varies from 78-159.   It is in general higher as the day goes on.   Past Medical History:  Diagnosis Date  . Allergic rhinitis   . Diabetes mellitus   . History of recurrent UTIs    [name]  . HTN (hypertension)   . Hyperlipidemia   . Migraine headache    quiescent  . Sleep disorder    [name] May  . TIA (transient ischemic attack)     Past Surgical History:  Procedure Laterality Date  . COLONOSCOPY     Dr Carlean [name]; hemorrhoids  . CORONARY ANGIOPLASTY  1997   for chest pain- negative   . POLYPECTOMY  2002   benign, hyperplastic polyp; rectal bleeding 2008  . TONSILLECTOMY    . TOTAL ABDOMINAL HYSTERECTOMY  1983   BSO for endometriosis  . WISDOM TOOTH EXTRACTION      Social History   Social History  . Marital status: Married    Spouse name: N/A  . Number of children: N/A  . Years of education: N/A   Occupational History  . Building services engineer Staffing   Social History Main Topics  . Smoking status: Never Smoker  . Smokeless tobacco: Never Used  . Alcohol use No  . Drug use: No  . Sexual activity: Not on file   Other Topics Concern  . Not on file   Social History Narrative   Regular exercise- no     Current Outpatient Prescriptions on File Prior to Visit  Medication Sig Dispense Refill  . aspirin 81 MG tablet Take 81 mg by mouth daily.       [name] atorvastatin (LIPITOR) 40 MG tablet TAKE 1 TABLET (40 MG TOTAL) BY MOUTH DAILY AT 6 PM. 90 tablet 1  . BD PEN NEEDLE NANO U/F 32G X 4 MM MISC USE ONCE A DAY AS DIRECTED 90 each 3  . desvenlafaxine (PRISTIQ) 100 MG 24 hr tablet Take 100 mg by mouth daily.    [name] gabapentin (NEURONTIN) 100 MG capsule Take 2 capsules (200 mg total) by mouth at bedtime. 60 capsule 3  . Lancets (ONETOUCH ULTRASOFT) lancets Check blood sugar once daily. Dx code: 250.00 100 each 12  . LORazepam (ATIVAN) 0.5 MG tablet Take 0.5 mg by mouth 2 (two) times daily as needed. For anxiety.    [name] losartan (COZAAR) 100 MG tablet TAKE 1 TABLET (100 MG TOTAL) BY MOUTH DAILY. 90 tablet 1  . metroNIDAZOLE (METROGEL) 0.75 % gel Apply 1 application topically 2 (two) times daily. 45 g 0  . omeprazole (PRILOSEC) 20 MG capsule TAKE 1 CAPSULE BY MOUTH DAILY. 30 capsule 6  . ondansetron (ZOFRAN) 4 MG tablet Take 1-2 tablets (4-8 mg total) by mouth every 8 (eight) hours as needed for nausea or vomiting. 60 tablet 0  . ONE TOUCH ULTRA TEST test strip USE 1  STRIP TWICE DAILY DX CODE E11.65 100 each 2  . pioglitazone (ACTOS) 30 MG tablet TAKE 1 TABLET (30 MG TOTAL) BY MOUTH DAILY. 90 tablet 3  . promethazine (PHENERGAN) 12.5 MG tablet Take 1 tablet (12.5 mg total) by mouth every 8 (eight) hours as needed for nausea or vomiting. 20 tablet 3  . traMADol (ULTRAM) 50 MG tablet Take 1 tablet (50 mg total) by mouth every 12 (twelve) hours. 60 tablet 0  . Vitamin D, Ergocalciferol, (DRISDOL) 50000 units CAPS capsule Take 1 capsule (50,000 Units total) by mouth every 7 (seven) days. 12 capsule 0  . Cholecalciferol (VITAMIN D3) 5000 UNITS CAPS Take 5,000 Units by mouth daily.     . naproxen (NAPROSYN) 500 MG tablet Take 1 tablet (500 mg total) by mouth 2 (two) times daily with a meal. (Patient not taking: Reported on 02/25/2017) 60 tablet 3   No current facility-administered medications on file prior to visit.     Allergies  Allergen Reactions  .  Vioxx [Rofecoxib]     Other reaction(s): Other (See Comments) Excess BP which caused TIA Excess BP which caused TIA  . Prednisone Other (See Comments)    Mental status changes No associated rash or fever  . Colesevelam Other (See Comments)    Leg Pain    Family History  Problem Relation Age of Onset  . Colon cancer Maternal Aunt   . Diabetes Paternal Uncle   . Heart attack Father 58  . COPD Mother   . Lung cancer Brother     smoker  . Mental illness      niece, committed suicide.  [name] Heart attack      brother X 2; @ 25 & 44  . Stroke Neg Hx     BP 132/84   Pulse 96   Ht 5\' 6"  (1.676 m)   Wt 186 lb (84.4 kg)   LMP  (LMP Unknown)   SpO2 97%   BMI 30.02 kg/m    Review of Systems She denies hypoglycemia.      Objective:   Physical Exam VITAL SIGNS:  See vs page GENERAL: no distress Pulses: dorsalis pedis intact bilat.   MSK: no deformity of the feet CV: trace bilat leg edema.  Few vv's.   Skin:  no ulcer on the feet.  normal color and temp on the feet. Neuro: sensation is intact to touch on the feet.    a1c=6.7%    Assessment & Plan:  Insulin-requiring type 2 DM, with TIA: overcontrolled, given this regimen, which does match insulin to her changing needs throughout the day.   Patient is advised the following: Patient Instructions  check your blood sugar twice a day.  vary the time of day when you check, between before the 3 meals, and at bedtime.  also check if you have symptoms of your blood sugar being too high or too low.  please keep a record of the readings and bring it to your next appointment here.  You can write it on any piece of paper.  please call us sooner if your blood sugar goes below 70, or if you have a lot of readings over 200.   Please reduce the basaglar to 25 units daily, and:  continue the same other medications for diabetes.    Please come back for a follow-up appointment in 4 months.

## 2017-02-25 NOTE — Patient Instructions (Addendum)
check your blood sugar twice a day.  vary the time of day when you check, between before the 3 meals, and at bedtime.  also check if you have symptoms of your blood sugar being too high or too low.  please keep a record of the readings and bring it to your next appointment here.  You can write it on any piece of paper.  please call us sooner if your blood sugar goes below 70, or if you have a lot of readings over 200.   Please reduce the basaglar to 25 units daily, and:  continue the same other medications for diabetes.    Please come back for a follow-up appointment in 4 months.

## 2017-03-04 DIAGNOSIS — I10 Essential (primary) hypertension: Secondary | ICD-10-CM | POA: Diagnosis not present

## 2017-03-04 DIAGNOSIS — N390 Urinary tract infection, site not specified: Secondary | ICD-10-CM | POA: Diagnosis not present

## 2017-03-11 ENCOUNTER — Encounter: Payer: Self-pay | Admitting: Endocrinology

## 2017-03-15 NOTE — Progress Notes (Signed)
[name] [place], [place] [address] Phone: [phone] Subjective:    CC: Neck and upper back pain follow-up  [id number]  Christine [name] is a 66 y.o. female coming in for follow-up of her neck pain. Patient states doing well, conservative therapy. Seems to be stable overall. Is having another migraine. When she does have migraines do have tightness in the neck.    Continues to have bilateral hip pain. It appears the patient is having more radicular symptoms. Patient did have an MRI of the lumbar spine. MRI was independently visualized by me. Patient's MRI did show that patient did have mild to moderate facet arthritis but no true nerve impingement. Patient elected try an epidural. This was done on 01/12/2017. Since then patient states Patient states Still having some mild low back pain and he started having increasing pain of the lateral hips.  Patient did unfortunately trip and break her small toe on the left foot. Has been in a postop boot for 3 weeks. Is having worsening symptoms. Patient has been wearing the boot fairly religiously. States that if she walks for seems to hurt. Still has an aching sensation more on the bottom of the foot.   Past Medical History:  Diagnosis Date  . Allergic rhinitis   . Diabetes mellitus   . History of recurrent UTIs    [name]  . HTN (hypertension)   . Hyperlipidemia   . Migraine headache    quiescent  . Sleep disorder    [name] May  . TIA (transient ischemic attack)    Past Surgical History:  Procedure Laterality Date  . COLONOSCOPY     Dr Carlean [name]; hemorrhoids  . CORONARY ANGIOPLASTY  1997   for chest pain- negative   . POLYPECTOMY  2002   benign, hyperplastic polyp; rectal bleeding 2008  . TONSILLECTOMY    . TOTAL ABDOMINAL HYSTERECTOMY  1983   BSO for endometriosis  . WISDOM TOOTH EXTRACTION     Social History   Social History  . Marital status: Married    Spouse name: N/A    . Number of children: N/A  . Years of education: N/A   Occupational History  . Building services engineer Staffing   Social History Main Topics  . Smoking status: Never Smoker  . Smokeless tobacco: Never Used  . Alcohol use No  . Drug use: No  . Sexual activity: Not on file   Other Topics Concern  . Not on file   Social History Narrative   Regular exercise- no    Allergies  Allergen Reactions  . Vioxx [Rofecoxib]     Other reaction(s): Other (See Comments) Excess BP which caused TIA Excess BP which caused TIA  . Prednisone Other (See Comments)    Mental status changes No associated rash or fever  . Colesevelam Other (See Comments)    Leg Pain   Family History  Problem Relation Age of Onset  . Colon cancer Maternal Aunt   . Diabetes Paternal Uncle   . Heart attack Father 61  . COPD Mother   . Lung cancer Brother     smoker  . Mental illness      niece, committed suicide.  [name] Heart attack      brother X 2; @ 42 & 54  . Stroke Neg Hx     Past medical history, social, surgical and family history all reviewed in electronic medical record.   Review of  Systems: No headache, visual changes, nausea, vomiting, diarrhea, constipation, dizziness, abdominal pain, skin rash, fevers, chills, night sweats, weight loss, swollen lymph nodes,chest pain, shortness of breath, mood changes.  Positive muscle aches, joint pain.   Objective  Blood pressure (!) 146/92, pulse 89, resp. rate 16, weight 185 lb 8 oz (84.1 kg), SpO2 97 %.  Systems examined below as of 03/16/17 General: NAD A&O x3 mood, affect normal  HEENT: Pupils equal, extraocular movements intact no nystagmus Respiratory: not short of breath at rest or with speaking Cardiovascular: No lower extremity edema, non tender Skin: Warm dry intact with no signs of infection or rash on extremities or on axial skeleton. Abdomen: Soft nontender, no masses Neuro: Cranial nerves  intact, neurovascularly intact in all  extremities with 2+ DTRs and 2+ pulses. Lymph: No lymphadenopathy appreciated today  Gait Antalgic gait favoring the left foot MSK: Non tender with full range of motion and good stability and symmetric strength and tone of shoulders, elbows, wrist,  knee hips and ankles bilaterally.  Mild arthritic changes of multiple joints  Foot exam shows the patient does have severe tenderness over the fifth metatarsal. No crepitus felt. Patient does have involuntary guarding though. Back Exam:  Inspection: Unremarkable  Motion: Flexion 45 deg, Extension 15 deg worsening pain , Side Bending to 45 deg bilaterally,  Rotation to 45 deg bilaterally  SLR laying: mild positive bilateral.  XSLR laying: Negative  Palpable tenderness: increasing discomfort in the paraspinal musculature.  FABER: negative. Sensory change: Gross sensation intact to all lumbar and sacral dermatomes.  Reflexes: 2+ at both patellar tendons, 2+ at achilles tendons, Babinski's downgoing.  Strength at foot  Plantar-flexion: 5/5 Dorsi-flexion: 5/5 Eversion: 5/5 Inversion: 5/5  Leg strength  Quad: 5/5 Hamstring: 5/5 Hip flexor: 5/5 Hip abductors: 5/5  Gait unremarkable.   Neck: Inspection unremarkable. No palpable stepoffs. Worsening tightness as well of the lower back.  Grip strength and sensation normal in bilateral hands Strength good C4 to T1 distribution No sensory change to C4 to T1 Negative Hoffman sign bilaterally Reflexes normal    Osteopathic findings Cervical C2 flexed rotated and side bent right C4 flexed rotated and side bent left C7 flexed rotated and side bent left T3 extended rotated and side bent right inhaled third rib T10 extended rotated and side bent left L3 flexed rotated and side bent right Sacrum right on right      Impression and Recommendations:     This case required medical decision making of moderate complexity.

## 2017-03-16 ENCOUNTER — Encounter: Payer: Self-pay | Admitting: Family Medicine

## 2017-03-16 ENCOUNTER — Other Ambulatory Visit (INDEPENDENT_AMBULATORY_CARE_PROVIDER_SITE_OTHER): Payer: Medicare Other

## 2017-03-16 ENCOUNTER — Ambulatory Visit (INDEPENDENT_AMBULATORY_CARE_PROVIDER_SITE_OTHER): Payer: Medicare Other | Admitting: Family Medicine

## 2017-03-16 VITALS — BP 146/92 | HR 89 | Resp 16 | Wt 185.5 lb

## 2017-03-16 DIAGNOSIS — M999 Biomechanical lesion, unspecified: Secondary | ICD-10-CM | POA: Diagnosis not present

## 2017-03-16 DIAGNOSIS — E559 Vitamin D deficiency, unspecified: Secondary | ICD-10-CM

## 2017-03-16 DIAGNOSIS — M5416 Radiculopathy, lumbar region: Secondary | ICD-10-CM

## 2017-03-16 DIAGNOSIS — S92352A Displaced fracture of fifth metatarsal bone, left foot, initial encounter for closed fracture: Secondary | ICD-10-CM | POA: Diagnosis not present

## 2017-03-16 HISTORY — DX: Displaced fracture of fifth metatarsal bone, left foot, initial encounter for closed fracture: S92.352A

## 2017-03-16 LAB — VITAMIN D 25 HYDROXY (VIT D DEFICIENCY, FRACTURES): VITD: 39.17 ng/mL (ref 30.00–100.00)

## 2017-03-16 MED ORDER — TRAMADOL HCL 50 MG PO TABS: 50.0000 mg | ORAL_TABLET | Freq: Two times a day (BID) | ORAL | 0 refills | Status: DC

## 2017-03-16 MED ORDER — GABAPENTIN 300 MG PO CAPS: 300.0000 mg | ORAL_CAPSULE | Freq: Every day | ORAL | 3 refills | Status: DC

## 2017-03-16 NOTE — Assessment & Plan Note (Signed)
Decision today to treat with OMT was based on Physical Exam  After verbal consent patient was treated with HVLA, ME, FPR techniques in cervical, thoracic, rib lumbar and sacral areas  Patient tolerated the procedure well with improvement in symptoms  Patient given exercises, stretches and lifestyle modifications  See medications in patient instructions if given  Patient will follow up in 3 weeks 

## 2017-03-16 NOTE — Progress Notes (Deleted)
Much better 

## 2017-03-16 NOTE — Assessment & Plan Note (Signed)
Recheck labs 

## 2017-03-16 NOTE — Patient Instructions (Signed)
Good to see you  Boot daily for next 2-3 weeks.  Do try to come out of it when sitting and move ankle Check vitamin D downstairs and lets see where we are.  Gabapentin now 300mg  at night Take tramadol if you need it.  We order the epidural again.  See me again in 3 weeks.

## 2017-03-16 NOTE — Assessment & Plan Note (Signed)
Patient does have spiral fracture that seems to be extra-articular. Patient will be put in a Cam Walker because patient is not healing at this point. Likely patient's low vitamin D diabetes is also contribute in. Also patient is not wearing the postoperative regularly. Patient come back again in 2-3 weeks at the time ultrasound in nature patient is responding.

## 2017-03-16 NOTE — Assessment & Plan Note (Signed)
Worsening symptoms again today. Patient likely has a spinal stenosis. She will be worked in for another epidural. I think the patient did respond very well previously indicated 2 months relief. Hopefully she'll get even more next time. Patient was seen me 2 weeks after the injection. Continue all other conservative therapy and increase gabapentin to 300 mg.

## 2017-03-16 NOTE — Progress Notes (Signed)
Pre-visit discussion using our clinic review tool. No additional management support is needed unless otherwise documented below in the visit note.  

## 2017-03-17 DIAGNOSIS — R457 State of emotional shock and stress, unspecified: Secondary | ICD-10-CM | POA: Diagnosis not present

## 2017-03-18 ENCOUNTER — Other Ambulatory Visit: Payer: Self-pay | Admitting: Family Medicine

## 2017-03-23 ENCOUNTER — Ambulatory Visit: Payer: Medicare Other | Admitting: Family Medicine

## 2017-04-12 ENCOUNTER — Other Ambulatory Visit: Payer: Self-pay | Admitting: Family Medicine

## 2017-04-12 ENCOUNTER — Encounter: Payer: Self-pay | Admitting: Family Medicine

## 2017-04-12 ENCOUNTER — Ambulatory Visit: Payer: Self-pay

## 2017-04-12 ENCOUNTER — Ambulatory Visit (INDEPENDENT_AMBULATORY_CARE_PROVIDER_SITE_OTHER): Payer: Medicare Other | Admitting: Family Medicine

## 2017-04-12 VITALS — BP 140/72 | HR 91 | Resp 16 | Wt 186.0 lb

## 2017-04-12 DIAGNOSIS — M5416 Radiculopathy, lumbar region: Secondary | ICD-10-CM | POA: Diagnosis not present

## 2017-04-12 DIAGNOSIS — M999 Biomechanical lesion, unspecified: Secondary | ICD-10-CM

## 2017-04-12 DIAGNOSIS — M79672 Pain in left foot: Secondary | ICD-10-CM | POA: Diagnosis not present

## 2017-04-12 MED ORDER — KETOROLAC TROMETHAMINE 60 MG/2ML IM SOLN
60.0000 mg | Freq: Once | INTRAMUSCULAR | Status: AC
  Administered 2017-04-12: 60 mg via INTRAMUSCULAR

## 2017-04-12 NOTE — Assessment & Plan Note (Signed)
Worsening lumbar radiculopathy at this time. Discussed with patient in great length. We discussed icing regimen and home exercises. Patient will start to increase activity slowly over the course of next several days after the epidural. I will like to see patient back again in 2 weeks after the epidural.

## 2017-04-12 NOTE — Progress Notes (Signed)
[name] [address], [place] [address] Phone: [phone] Subjective:    CC: Neck and upper back pain follow-up  [id number]  Christine [name] is a 66 y.o. female coming in for follow-up of her neck pain. Patient states doing well, conservative therapy. Seems to be stable overall. Is having another migraine. When she does have migraines do have tightness in the neck.   Continues to have bilateral hip pain. It appears the patient is having more radicular symptoms. Patient did have an MRI of the lumbar spine. MRI was independently visualized by me. Patient's MRI did show that patient did have mild to moderate facet arthritis but no true nerve impingement. Patient elected try an epidural. This was done on 01/12/2017. Since then patient states Patient states Still having some mild low back pain and he started having increasing pain of the lateral hips. Patient states it is significantly worsen again. Starting to have significant amount of pain that is stopping her from even daily activities and waking her up at night. Worsening radicular symptoms seem to be going down both legs left greater than right. Still. Also the hips that was doing well after last epidural 3 months ago.  .   Past Medical History:  Diagnosis Date  . Allergic rhinitis   . Diabetes mellitus   . History of recurrent UTIs    [name]  . HTN (hypertension)   . Hyperlipidemia   . Migraine headache    quiescent  . Sleep disorder    [name] May  . TIA (transient ischemic attack)    Past Surgical History:  Procedure Laterality Date  . COLONOSCOPY     Dr Carlean [name]; hemorrhoids  . CORONARY ANGIOPLASTY  1997   for chest pain- negative   . POLYPECTOMY  2002   benign, hyperplastic polyp; rectal bleeding 2008  . TONSILLECTOMY    . TOTAL ABDOMINAL HYSTERECTOMY  1983   BSO for endometriosis  . WISDOM TOOTH EXTRACTION     Social History   Social History  . Marital status:  Married    Spouse name: N/A  . Number of children: N/A  . Years of education: N/A   Occupational History  . Building services engineer Staffing   Social History Main Topics  . Smoking status: Never Smoker  . Smokeless tobacco: Never Used  . Alcohol use No  . Drug use: No  . Sexual activity: Not on file   Other Topics Concern  . Not on file   Social History Narrative   Regular exercise- no    Allergies  Allergen Reactions  . Vioxx [Rofecoxib]     Other reaction(s): Other (See Comments) Excess BP which caused TIA Excess BP which caused TIA  . Prednisone Other (See Comments)    Mental status changes No associated rash or fever  . Colesevelam Other (See Comments)    Leg Pain   Family History  Problem Relation Age of Onset  . Colon cancer Maternal Aunt   . Diabetes Paternal Uncle   . Heart attack Father 52  . COPD Mother   . Lung cancer Brother     smoker  . Mental illness      niece, committed suicide.  [name] Heart attack      brother X 2; @ 69 & 41  . Stroke Neg Hx     Past medical history, social, surgical and family history all reviewed in electronic medical record.   Review of  Systems: No visual changes, nausea, vomiting, diarrhea, constipation,  abdominal pain, skin rash, fevers, chills, night sweats, weight loss, swollen lymph nodes,chest pain, shortness of breath, mood changes.  Positive muscle aches, dizziness, joint pain and body aches.   Objective  Blood pressure 140/72, pulse 91, resp. rate 16, weight 186 lb (84.4 kg), SpO2 98 %.  Systems examined below as of 04/12/17 General: NAD A&O x3 mood, affect normal  HEENT: Pupils equal, extraocular movements intact no nystagmus Respiratory: not short of breath at rest or with speaking Cardiovascular: No lower extremity edema, non tender Skin: Warm dry intact with no signs of infection or rash on extremities or on axial skeleton. Abdomen: Soft nontender, no masses Neuro: Cranial nerves  intact,  neurovascularly intact in all extremities with 2+ DTRs and 2+ pulses. Lymph: No lymphadenopathy appreciated today  Gait normal with good balance and coordination.  MSK: Non tender with full range of motion and good stability and symmetric strength and tone of shoulders, elbows, wrist,  knee hips and ankles bilaterally.   Mild arthritic changes of multiple joints Exam shows the patient is no longer tender over the fifth metatarsal..  Back Exam:  Inspection: Unremarkable  Motion: Flexion 20 deg positive pain going down both sides of the hip, Extension 15 deg worsening pain in her previous exam , Side Bending to 45 deg bilaterally,  Rotation to 45 deg bilaterally  SLR laying: Positive right sided XSLR laying: Negative  Palpable tenderness: Severe tenderness to palpation in the paraspinal musculature of the lumbar spine  FABER: Positive right. Sensory change: Gross sensation intact to all lumbar and sacral dermatomes.  Reflexes: 2+ at both patellar tendons, 2+ at achilles tendons, Babinski's downgoing.  Strength at foot  4-5 strength of the left side compared to the contralateral side.   Neck: Inspection unremarkable. No palpable stepoffs. Negative Spurling's maneuver. Lacks last 5 of extension Grip strength and sensation normal in bilateral hands Strength good C4 to T1 distribution No sensory change to C4 to T1 Negative Hoffman sign bilaterally Reflexes normal   Osteopathic findings Cervical C2 flexed rotated and side bent right T3 extended rotated and side bent right inhaled third rib T8 extended rotated and side bent left   Limited muscular skeletal ultrasound was performed and interpreted by Lyndal Pulley  Limited ultrasound shows the patient does have good callus formation over the fifth metatarsal. Full callus that is hard at this time noted.     Impression and Recommendations:     This case required medical decision making of moderate complexity.

## 2017-04-12 NOTE — Assessment & Plan Note (Signed)
Seems to be healing at this time. No changes but patient can come out of the boot at this time with no problems.

## 2017-04-12 NOTE — Patient Instructions (Signed)
good to see you  Christine [name] is your friend.  Gave yo u1 injection today  Tanzidine at night Is fine and can try 1/2 tab during the day.  If take tramadol take it with one tylenol as well.  See me again in 2 weeks after the epidural.

## 2017-04-20 ENCOUNTER — Other Ambulatory Visit: Payer: Self-pay

## 2017-04-20 ENCOUNTER — Encounter: Payer: Self-pay | Admitting: Family Medicine

## 2017-04-20 DIAGNOSIS — M5416 Radiculopathy, lumbar region: Secondary | ICD-10-CM

## 2017-04-23 ENCOUNTER — Other Ambulatory Visit: Payer: Self-pay | Admitting: Endocrinology

## 2017-04-28 ENCOUNTER — Ambulatory Visit
Admission: RE | Admit: 2017-04-28 | Discharge: 2017-04-28 | Disposition: A | Discharge status: Home / Self Care | Payer: Medicare Other | Source: Ambulatory Visit | Attending: Family Medicine | Admitting: Family Medicine

## 2017-04-28 DIAGNOSIS — M5126 Other intervertebral disc displacement, lumbar region: Secondary | ICD-10-CM | POA: Diagnosis not present

## 2017-04-28 DIAGNOSIS — M5416 Radiculopathy, lumbar region: Secondary | ICD-10-CM

## 2017-04-28 MED ORDER — METHYLPREDNISOLONE ACETATE 40 MG/ML INJ SUSP (RADIOLOG
120.0000 mg | Freq: Once | INTRAMUSCULAR | Status: AC
  Administered 2017-04-28: 120 mg via EPIDURAL

## 2017-04-28 MED ORDER — IOPAMIDOL (ISOVUE-M 200) INJECTION 41%
1.0000 mL | Freq: Once | INTRAMUSCULAR | Status: AC
  Administered 2017-04-28: 1 mL via EPIDURAL

## 2017-05-03 ENCOUNTER — Other Ambulatory Visit: Payer: Self-pay | Admitting: Endocrinology

## 2017-05-11 NOTE — Progress Notes (Signed)
[name] [address] [place], [place] [address] Phone: [phone] Subjective:    CC: Neck and upper back pain follow-up  [id number]  Christine [name] Christine [name] is a 66 y.o. female coming in for follow-up of her neck pain. , Back pain  Continues to have bilateral hip pain. It appears the patient is having more radicular symptoms. Patient did have an MRI of the lumbar spine. MRI was independently visualized by me. Patient's MRI did show that patient did have mild to moderate facet arthritis but no true nerve impingement. Patient elected try an epidural. This was done on 01/12/2017. Patient was having worsening symptoms and did have a repeat epidural done 04/28/2017 at L4-L5. Patient states  She is doing significantly better. States that she is feeling 80% better. Not having as much of the hip pain. Happy with the results of far. Patient has responded well to osteopathic manipulation. Is having increasing stress recently because difficulty with her family. Notices more increasing neck pain and bilateral shoulder pain.     Past Medical History:  Diagnosis Date  . Allergic rhinitis   . Diabetes mellitus   . History of recurrent UTIs    [name]  . HTN (hypertension)   . Hyperlipidemia   . Migraine headache    quiescent  . Sleep disorder    [name] May  . TIA (transient ischemic attack)    Past Surgical History:  Procedure Laterality Date  . COLONOSCOPY     Dr Carlean [name]; hemorrhoids  . CORONARY ANGIOPLASTY  1997   for chest pain- negative   . POLYPECTOMY  2002   benign, hyperplastic polyp; rectal bleeding 2008  . TONSILLECTOMY    . TOTAL ABDOMINAL HYSTERECTOMY  1983   BSO for endometriosis  . WISDOM TOOTH EXTRACTION     Social History   Social History  . Marital status: Married    Spouse name: N/A  . Number of children: N/A  . Years of education: N/A   Occupational History  . Building services engineer Staffing   Social History  Main Topics  . Smoking status: Never Smoker  . Smokeless tobacco: Never Used  . Alcohol use No  . Drug use: No  . Sexual activity: Not on file   Other Topics Concern  . Not on file   Social History Narrative   Regular exercise- no    Allergies  Allergen Reactions  . Vioxx [Rofecoxib] Other (See Comments)    Excess BP which caused TIA   . Prednisone Other (See Comments)    Mental status changes No associated rash or fever  . Colesevelam Other (See Comments)    Leg Pain   Family History  Problem Relation Age of Onset  . Colon cancer Maternal Aunt   . Diabetes Paternal Uncle   . Heart attack Father 63  . COPD Mother   . Lung cancer Brother        smoker  . Mental illness Unknown        niece, committed suicide.  [name] Heart attack Unknown        brother X 2; @ 25 & 45  . Stroke Neg Hx     Past medical history, social, surgical and family history all reviewed in electronic medical record.   Review of Systems: No headache, visual changes, nausea, vomiting, diarrhea, constipation, dizziness, abdominal pain, skin rash, fevers, chills, night sweats, weight loss, swollen lymph nodes, body aches, joint swelling, muscle aches, chest pain,  shortness of breath, mood changes.     Objective  Blood pressure 124/80, pulse 93, height 5\' 6"  (1.676 m), SpO2 96 %.  Systems examined below as of 05/12/17 General: NAD A&O x3 mood, affect normal  HEENT: Pupils equal, extraocular movements intact no nystagmus Respiratory: not short of breath at rest or with speaking Cardiovascular: No lower extremity edema, non tender Skin: Warm dry intact with no signs of infection or rash on extremities or on axial skeleton. Abdomen: Soft nontender, no masses Neuro: Cranial nerves  intact, neurovascularly intact in all extremities with 2+ DTRs and 2+ pulses. Lymph: No lymphadenopathy appreciated today  Gait normal with good balance and coordination.  MSK: Non tender with full range of motion and good  stability and symmetric strength and tone of shoulders, elbows, wrist,  knee hips and ankles bilaterally.    Back Exam:  Inspection: Increasing kyphosis of the upper thoracic spine Motion: Patient still has some limitation in rotation and side bending bilaterally but now is full flexion which is an improvement. Rotation to 45 deg bilaterally  SLR laying: Negative straight leg test. Still tightness of the hamstring on the right side compared to contralateral side XSLR laying: Negative  Palpable tenderness: Mild tenderness to palpation in the paraspinal musculature lumbar spine. FABER: Positive right. Still present Sensory change: Gross sensation intact to all lumbar and sacral dermatomes.  Reflexes: 2+ at both patellar tendons, 2+ at achilles tendons, Babinski's downgoing.  Strength at foot  4+ out of 5 strength but symmetric.   Neck: Inspection unremarkable. No palpable stepoffs. Negative Spurling's maneuver. Lacks last 5 of extension Grip strength and sensation normal in bilateral hands Strength good C4 to T1 distribution No sensory change to C4 to T1 Negative Hoffman sign bilaterally Reflexes normal   Osteopathic findings  C2 flexed rotated and side bent right C4 flexed rotated and side bent left C6 flexed rotated and side bent left T3 extended rotated and side bent right inhaled third rib T9 extended rotated and side bent left         Impression and Recommendations:     This case required medical decision making of moderate complexity.

## 2017-05-12 ENCOUNTER — Encounter: Payer: Self-pay | Admitting: Family Medicine

## 2017-05-12 ENCOUNTER — Ambulatory Visit (INDEPENDENT_AMBULATORY_CARE_PROVIDER_SITE_OTHER): Payer: Medicare Other | Admitting: Family Medicine

## 2017-05-12 VITALS — BP 124/80 | HR 93 | Ht 66.0 in

## 2017-05-12 DIAGNOSIS — M5416 Radiculopathy, lumbar region: Secondary | ICD-10-CM

## 2017-05-12 DIAGNOSIS — M999 Biomechanical lesion, unspecified: Secondary | ICD-10-CM | POA: Diagnosis not present

## 2017-05-12 DIAGNOSIS — M797 Fibromyalgia: Secondary | ICD-10-CM | POA: Diagnosis not present

## 2017-05-12 MED ORDER — GABAPENTIN 100 MG PO CAPS: 200.0000 mg | ORAL_CAPSULE | Freq: Every day | ORAL | 3 refills | Status: DC

## 2017-05-12 NOTE — Assessment & Plan Note (Signed)
Stable at the moment. We'll make no changes in medication. We'll continue to monitor. I do believe that distress could exacerbate a flare if we are not careful.

## 2017-05-12 NOTE — Assessment & Plan Note (Signed)
Patient responded well to the repeat epidural. No longer having significant radicular symptoms. We'll increase activity as tolerated. Patient may need further evaluation if patient gets to 3 epidurals within a 12 month period. Hopefully patient will do well. Encourage her to continue to monitor her blood sugars closely. Discussed the importance of weight loss and core strength. Follow-up again 4-6 weeks

## 2017-05-12 NOTE — Patient Instructions (Addendum)
Great to see you as always.  Stay active.  Ice is your friend.  Gabapentin 100-300mg  at nigh t Tart cherry extract at night any dose  See me again in 4-6 weeks

## 2017-05-12 NOTE — Assessment & Plan Note (Signed)
Decision today to treat with OMT was based on Physical Exam  After verbal consent patient was treated with HVLA, ME, FPR techniques in cervical, thoracic, rib areas  Patient tolerated the procedure well with improvement in symptoms  Patient given exercises, stretches and lifestyle modifications  See medications in patient instructions if given  Patient will follow up in 4-6 weeks 

## 2017-05-17 DIAGNOSIS — H353131 Nonexudative age-related macular degeneration, bilateral, early dry stage: Secondary | ICD-10-CM | POA: Diagnosis not present

## 2017-05-17 DIAGNOSIS — H5201 Hypermetropia, right eye: Secondary | ICD-10-CM | POA: Diagnosis not present

## 2017-05-17 DIAGNOSIS — H52223 Regular astigmatism, bilateral: Secondary | ICD-10-CM | POA: Diagnosis not present

## 2017-05-17 DIAGNOSIS — H524 Presbyopia: Secondary | ICD-10-CM | POA: Diagnosis not present

## 2017-05-17 DIAGNOSIS — Z794 Long term (current) use of insulin: Secondary | ICD-10-CM | POA: Diagnosis not present

## 2017-05-17 DIAGNOSIS — H5212 Myopia, left eye: Secondary | ICD-10-CM | POA: Diagnosis not present

## 2017-05-17 DIAGNOSIS — E119 Type 2 diabetes mellitus without complications: Secondary | ICD-10-CM | POA: Diagnosis not present

## 2017-05-17 LAB — HM DIABETES EYE EXAM

## 2017-05-19 ENCOUNTER — Encounter: Payer: Self-pay | Admitting: Internal Medicine

## 2017-05-19 NOTE — Progress Notes (Unsigned)
Results entered and sent to scan  

## 2017-06-08 ENCOUNTER — Encounter: Payer: Self-pay | Admitting: Family Medicine

## 2017-06-08 ENCOUNTER — Ambulatory Visit (INDEPENDENT_AMBULATORY_CARE_PROVIDER_SITE_OTHER): Payer: Medicare Other | Admitting: Family Medicine

## 2017-06-08 VITALS — BP 124/82 | HR 94 | Ht 66.0 in | Wt 186.0 lb

## 2017-06-08 DIAGNOSIS — M999 Biomechanical lesion, unspecified: Secondary | ICD-10-CM | POA: Diagnosis not present

## 2017-06-08 DIAGNOSIS — M5416 Radiculopathy, lumbar region: Secondary | ICD-10-CM

## 2017-06-08 NOTE — Patient Instructions (Signed)
Good to see you  Alvera Singh is your friend.  Have a great time See em again in 4 weeks

## 2017-06-08 NOTE — Assessment & Plan Note (Signed)
Mild radicular symptoms seem to be returning. We will monitor closely. We discussed changing medications which patient declined. We discussed the possibility of repeating epidural which patient also declined. Patient will continue to be active. Patient did respond well to manipulation. Follow-up again in 4-6 weeks.

## 2017-06-08 NOTE — Progress Notes (Signed)
[name] [address] [place], [place] [address] Phone: [phone] Subjective:    CC: Neck and upper back pain follow-up  [id number]  Christine [name] is a 66 y.o. female coming in for follow-up of her neck pain. , Back pain  Continues to have bilateral hip pain. It appears the patient is having more radicular symptoms. Patient did have an MRI of the lumbar spine. MRI was independently visualized by me. Patient's MRI did show that patient did have mild to moderate facet arthritis but no true nerve impingement. Patient elected try an epidural. This was done on 01/12/2017. Patient was having worsening symptoms and did have a repeat epidural done 04/28/2017 at L4-L5. Patient states  Still doing well but is noticing some mild increase in left hip pain again. This was the first signs with her back was having the radicular symptoms. Patient's is excited though because she has grandchildren coming in the near future. Patient has no some tightness. Not having as many headaches. Has not had a migraine and some time. No side effects to the medications.     Past Medical History:  Diagnosis Date  . Allergic rhinitis   . Diabetes mellitus   . History of recurrent UTIs    [name]  . HTN (hypertension)   . Hyperlipidemia   . Migraine headache    quiescent  . Sleep disorder    [name] May  . TIA (transient ischemic attack)    Past Surgical History:  Procedure Laterality Date  . COLONOSCOPY     Dr Carlean [name]; hemorrhoids  . CORONARY ANGIOPLASTY  1997   for chest pain- negative   . POLYPECTOMY  2002   benign, hyperplastic polyp; rectal bleeding 2008  . TONSILLECTOMY    . TOTAL ABDOMINAL HYSTERECTOMY  1983   BSO for endometriosis  . WISDOM TOOTH EXTRACTION     Social History   Social History  . Marital status: Married    Spouse name: N/A  . Number of children: N/A  . Years of education: N/A   Occupational History  . Magazine features editor Staffing   Social History Main Topics  . Smoking status: Never Smoker  . Smokeless tobacco: Never Used  . Alcohol use No  . Drug use: No  . Sexual activity: Not on file   Other Topics Concern  . Not on file   Social History Narrative   Regular exercise- no    Allergies  Allergen Reactions  . Vioxx [Rofecoxib] Other (See Comments)    Excess BP which caused TIA   . Prednisone Other (See Comments)    Mental status changes No associated rash or fever  . Colesevelam Other (See Comments)    Leg Pain   Family History  Problem Relation Age of Onset  . Colon cancer Maternal Aunt   . Diabetes Paternal Uncle   . Heart attack Father 21  . COPD Mother   . Lung cancer Brother        smoker  . Mental illness Unknown        niece, committed suicide.  [name] Heart attack Unknown        brother X 2; @ 54 & 62  . Stroke Neg Hx     Past medical history, social, surgical and family history all reviewed in electronic medical record.   Review of Systems: No headache, visual changes, nausea, vomiting, diarrhea, constipation, dizziness, abdominal pain, skin rash, fevers, chills, night sweats, weight  loss, swollen lymph nodes, body aches, joint swelling, muscle aches, chest pain, shortness of breath, mood changes.     Objective  Blood pressure 124/82, pulse 94, height 5\' 6"  (1.676 m), weight 186 lb (84.4 kg), SpO2 95 %.  Systems examined below as of 06/08/17 General: NAD A&O x3 mood, affect normal  HEENT: Pupils equal, extraocular movements intact no nystagmus Respiratory: not short of breath at rest or with speaking Cardiovascular: No lower extremity edema, non tender Skin: Warm dry intact with no signs of infection or rash on extremities or on axial skeleton. Abdomen: Soft nontender, no masses Neuro: Cranial nerves  intact, neurovascularly intact in all extremities with 2+ DTRs and 2+ pulses. Lymph: No lymphadenopathy appreciated today  Gait normal with good balance and  coordination.  MSK: Non tender with full range of motion and good stability and symmetric strength and tone of shoulders, elbows, wrist,  knee hips and ankles bilaterally.    Back Exam:  Inspection: Mild increase in thoracic kyphosis Motion: Flexion 45 deg, Extension 25 deg, Side Bending to 45 deg bilaterally,  Rotation to 45 deg bilaterally  SLR laying: Mild positive on the left side XSLR laying: Negative  Palpable tenderness: Tender to palpation mostly in the thoracolumbar juncture. FABER: negative. Sensory change: Gross sensation intact to all lumbar and sacral dermatomes.  Reflexes: 2+ at both patellar tendons, 2+ at achilles tendons, Babinski's downgoing.  Strength at foot  Plantar-flexion: 5/5 Dorsi-flexion: 5/5 Eversion: 5/5 Inversion: 5/5  Leg strength  Quad: 5/5 Hamstring: 5/5 Hip flexor: 5/5 Hip abductors: 5/5  Gait unremarkable.    Neck: Inspection unremarkable. No palpable stepoffs. Negative Spurling's maneuver. Lacks last 3-5 of extension as well as 5 of side bending to the right Grip strength and sensation normal in bilateral hands Strength good C4 to T1 distribution No sensory change to C4 to T1 Negative Hoffman sign bilaterally Reflexes normal   Osteopathic findings C2 flexed rotated and side bent right C4 flexed rotated and side bent left C6 flexed rotated and side bent left T3 extended rotated and side bent right inhaled third rib T8 extended rotated and side bent left L3 flexed rotated and side bent right Sacrum right on right        Impression and Recommendations:     This case required medical decision making of moderate complexity.

## 2017-06-08 NOTE — Assessment & Plan Note (Signed)
Decision today to treat with OMT was based on Physical Exam  After verbal consent patient was treated with HVLA, ME, FPR techniques in cervical, thoracic, rib lumbar and sacral areas  Patient tolerated the procedure well with improvement in symptoms  Patient given exercises, stretches and lifestyle modifications  See medications in patient instructions if given  Patient will follow up in 4-6 weeks 

## 2017-06-09 ENCOUNTER — Ambulatory Visit: Payer: Medicare Other | Admitting: Family Medicine

## 2017-06-09 DIAGNOSIS — R457 State of emotional shock and stress, unspecified: Secondary | ICD-10-CM | POA: Diagnosis not present

## 2017-06-22 ENCOUNTER — Telehealth: Payer: Self-pay | Admitting: Family Medicine

## 2017-06-22 ENCOUNTER — Ambulatory Visit: Payer: Medicare Other | Admitting: Neurology

## 2017-06-22 NOTE — Telephone Encounter (Signed)
Spoke to pt, scheduled her tomorrow at 1115am.

## 2017-06-22 NOTE — Telephone Encounter (Signed)
Pt can hardly walk and can hardly lift her right leg, she would like a call back  She would like to know what to take for it

## 2017-06-23 ENCOUNTER — Encounter: Payer: Self-pay | Admitting: Family Medicine

## 2017-06-23 ENCOUNTER — Ambulatory Visit (INDEPENDENT_AMBULATORY_CARE_PROVIDER_SITE_OTHER): Payer: Medicare Other | Admitting: Family Medicine

## 2017-06-23 VITALS — BP 120/80 | HR 81 | Ht 66.0 in

## 2017-06-23 DIAGNOSIS — M5416 Radiculopathy, lumbar region: Secondary | ICD-10-CM | POA: Insufficient documentation

## 2017-06-23 DIAGNOSIS — M999 Biomechanical lesion, unspecified: Secondary | ICD-10-CM | POA: Diagnosis not present

## 2017-06-23 MED ORDER — METHYLPREDNISOLONE ACETATE 80 MG/ML IJ SUSP
80.0000 mg | Freq: Once | INTRAMUSCULAR | Status: AC
  Administered 2017-06-23: 80 mg via INTRAMUSCULAR

## 2017-06-23 MED ORDER — KETOROLAC TROMETHAMINE 60 MG/2ML IM SOLN
60.0000 mg | Freq: Once | INTRAMUSCULAR | Status: AC
  Administered 2017-06-23: 60 mg via INTRAMUSCULAR

## 2017-06-23 NOTE — Assessment & Plan Note (Signed)
Patient did have more of a lumbar radiculopathy. Discussed with patient at great length. We discussed icing regimen and home exercises. We discussed objective is a doing which ones to avoid. Increase activity as tolerated. Patient will come back and see me again in 1 week. We discussed worsening symptoms and advance imaging or possible epidurals may be necessary. Patient knows if any weakness to seek medical attention immediately.

## 2017-06-23 NOTE — Patient Instructions (Signed)
Good to see you  Christine [name] is your friend. Ice 20 minutes 2 times daily. Usually after activity and before bed. Tramadol with 1-2 tylenols when taking tramadol and would do that at night Tanzidine at night for next 3 nights and can take it during the day if not a lot better 2 injections today  See me again next week to make sure you are doing better If worsening then please go to emergency room but you will do fine.

## 2017-06-23 NOTE — Progress Notes (Signed)
[place], [place] [address] Phone: [phone] Subjective:    I'm seeing this patient by the request  of:    CC: right low back and leg pain   [id number]  Christine [name] is a 66 y.o. female coming in with complaint of right low back pain. Patient has had low back pain seems to be more on the right side with radiation down the right leg. Severe. Patient states that the pain medication she has has not been helping. Patient was given a muscle relaxer and has been some very minimal improvement. Ibuprofen with very minimal improvement as well. Denies weakness but makes it difficult to play with her grandchildren.     Past Medical History:  Diagnosis Date  . Allergic rhinitis   . Diabetes mellitus   . History of recurrent UTIs    [name]  . HTN (hypertension)   . Hyperlipidemia   . Migraine headache    quiescent  . Sleep disorder    [name] May  . TIA (transient ischemic attack)    Past Surgical History:  Procedure Laterality Date  . COLONOSCOPY     Dr Carlean [name]; hemorrhoids  . CORONARY ANGIOPLASTY  1997   for chest pain- negative   . POLYPECTOMY  2002   benign, hyperplastic polyp; rectal bleeding 2008  . TONSILLECTOMY    . TOTAL ABDOMINAL HYSTERECTOMY  1983   BSO for endometriosis  . WISDOM TOOTH EXTRACTION     Social History   Social History  . Marital status: Married    Spouse name: N/A  . Number of children: N/A  . Years of education: N/A   Occupational History  . Building services engineer Staffing   Social History Main Topics  . Smoking status: Never Smoker  . Smokeless tobacco: Never Used  . Alcohol use No  . Drug use: No  . Sexual activity: Not on file   Other Topics Concern  . Not on file   Social History Narrative   Regular exercise- no    Allergies  Allergen Reactions  . Vioxx [Rofecoxib] Other (See Comments)    Excess BP which caused TIA   . Prednisone Other (See Comments)     Mental status changes No associated rash or fever  . Colesevelam Other (See Comments)    Leg Pain   Family History  Problem Relation Age of Onset  . Colon cancer Maternal Aunt   . Diabetes Paternal Uncle   . Heart attack Father 77  . COPD Mother   . Lung cancer Brother        smoker  . Mental illness Unknown        niece, committed suicide.  [name] Heart attack Unknown        brother X 2; @ 18 & 67  . Stroke Neg Hx     Past medical history, social, surgical and family history all reviewed in electronic medical record.  No pertanent information unless stated regarding to the chief complaint.   Review of Systems:Review of systems updated and as accurate as of 06/22/17  No headache, visual changes, nausea, vomiting, diarrhea, constipation, dizziness, abdominal pain, skin rash, fevers, chills, night sweats, weight loss, swollen lymph nodes, body aches, joint swelling, chest pain, shortness of breath, mood changes. Positive muscle aches  Objective  There were no vitals taken for this visit. Systems examined below as of 06/22/17   General: No apparent distress alert and oriented x3  mood and affect normal, dressed appropriately.  HEENT: Pupils equal, extraocular movements intact  Respiratory: Patient's speak in full sentences and does not appear short of breath  Cardiovascular: No lower extremity edema, non tender, no erythema  Skin: Warm dry intact with no signs of infection or rash on extremities or on axial skeleton.  Abdomen: Soft nontender  Neuro: Cranial nerves II through XII are intact, neurovascularly intact in all extremities with 2+ DTRs and 2+ pulses.  Lymph: No lymphadenopathy of posterior or anterior cervical chain or axillae bilaterally.  Gait Antalgic MSK:  Non tender with full range of motion and good stability and symmetric strength and tone of shoulders, elbows, wrist, hip, and ankles bilaterally.  Back Exam:  Inspection: Unremarkable  Motion: Flexion 30 deg with  worsening symptoms, Extension 15 deg, Side Bending to 45 deg bilaterally,  Rotation to 45 deg bilaterally  SLR laying: Positive right side XSLR laying: Negative  Palpable tenderness: severe tenderness to palpation over the paraspinal musculature of the lumbar spine at L5-S1 on the right side e. FABER: Unable to do secondary to pain. Sensory change: Gross sensation intact to all lumbar and sacral dermatomes.  Reflexes: 2+ at both patellar tendons, 2+ at achilles tendons, Babinski's downgoing.  Strength at foot  4+ out of 5 strength but symmetric.      Impression and Recommendations:     This case required medical decision making of moderate complexity.      Note: This dictation was prepared with Dragon dictation along with smaller phrase technology. Any transcriptional errors that result from this process are unintentional.

## 2017-06-28 ENCOUNTER — Encounter: Payer: Self-pay | Admitting: Family Medicine

## 2017-06-28 ENCOUNTER — Encounter: Payer: Self-pay | Admitting: Endocrinology

## 2017-06-28 ENCOUNTER — Ambulatory Visit (INDEPENDENT_AMBULATORY_CARE_PROVIDER_SITE_OTHER): Payer: Medicare Other | Admitting: Endocrinology

## 2017-06-28 ENCOUNTER — Ambulatory Visit (INDEPENDENT_AMBULATORY_CARE_PROVIDER_SITE_OTHER): Payer: Medicare Other | Admitting: Family Medicine

## 2017-06-28 VITALS — BP 122/78 | HR 85 | Ht 66.0 in | Wt 187.0 lb

## 2017-06-28 VITALS — BP 130/84 | HR 86 | Ht 66.0 in | Wt 186.6 lb

## 2017-06-28 DIAGNOSIS — Z794 Long term (current) use of insulin: Secondary | ICD-10-CM | POA: Diagnosis not present

## 2017-06-28 DIAGNOSIS — E1151 Type 2 diabetes mellitus with diabetic peripheral angiopathy without gangrene: Secondary | ICD-10-CM | POA: Diagnosis not present

## 2017-06-28 DIAGNOSIS — M5416 Radiculopathy, lumbar region: Secondary | ICD-10-CM

## 2017-06-28 LAB — POCT GLYCOSYLATED HEMOGLOBIN (HGB A1C): Hemoglobin A1C: 7

## 2017-06-28 NOTE — Progress Notes (Signed)
[place], [place] [address] Phone: [phone] Subjective:      CC: right low back and leg pain f/u   [id number]  Christine [name] is a 66 y.o. female coming in with complaint of right low back pain. Patient was seen previously and was having radicular symptoms with weakness down the right leg. Patient wanted to try conservative therapy including injections, and medications. Patient states 40% better. Still having some radicular pain of the right leg. Patient still states still having pain.  Still affecting ADL's     Past Medical History:  Diagnosis Date  . Allergic rhinitis   . Diabetes mellitus   . History of recurrent UTIs    [name]  . HTN (hypertension)   . Hyperlipidemia   . Migraine headache    quiescent  . Sleep disorder    [name] May  . TIA (transient ischemic attack)    Past Surgical History:  Procedure Laterality Date  . COLONOSCOPY     Dr Carlean [name]; hemorrhoids  . CORONARY ANGIOPLASTY  1997   for chest pain- negative   . POLYPECTOMY  2002   benign, hyperplastic polyp; rectal bleeding 2008  . TONSILLECTOMY    . TOTAL ABDOMINAL HYSTERECTOMY  1983   BSO for endometriosis  . WISDOM TOOTH EXTRACTION     Social History   Social History  . Marital status: Married    Spouse name: N/A  . Number of children: N/A  . Years of education: N/A   Occupational History  . Building services engineer Staffing   Social History Main Topics  . Smoking status: Never Smoker  . Smokeless tobacco: Never Used  . Alcohol use No  . Drug use: No  . Sexual activity: Not Asked   Other Topics Concern  . None   Social History Narrative   Regular exercise- no    Allergies  Allergen Reactions  . Vioxx [Rofecoxib] Other (See Comments)    Excess BP which caused TIA   . Prednisone Other (See Comments)    Mental status changes No associated rash or fever  . Colesevelam Other (See Comments)    Leg Pain    Family History  Problem Relation Age of Onset  . Colon cancer Maternal Aunt   . Diabetes Paternal Uncle   . Heart attack Father 12  . COPD Mother   . Lung cancer Brother        smoker  . Mental illness Unknown        niece, committed suicide.  [name] Heart attack Unknown        brother X 2; @ 19 & 80  . Stroke Neg Hx     Past medical history, social, surgical and family history all reviewed in electronic medical record.  No pertanent information unless stated regarding to the chief complaint.   Review of Systems: No headache, visual changes, nausea, vomiting, diarrhea, constipation, dizziness, abdominal pain, skin rash, fevers, chills, night sweats, weight loss, swollen lymph nodes, body aches, joint swelling,  chest pain, shortness of breath, mood changes. Positive muscle aches   Objective  Blood pressure 122/78, pulse 85, height 5\' 6"  (1.676 m), weight 187 lb (84.8 kg), SpO2 98 %.   Systems examined below as of 06/28/17 General: NAD A&O x3 mood, affect normal  HEENT: Pupils equal, extraocular movements intact no nystagmus Respiratory: not short of breath at rest or with speaking Cardiovascular: No lower extremity edema, non tender Skin:  Warm dry intact with no signs of infection or rash on extremities or on axial skeleton. Abdomen: Soft nontender, no masses Neuro: Cranial nerves  intact, neurovascularly intact in all extremities with 2+ DTRs and 2+ pulses. Lymph: No lymphadenopathy appreciated today  Gait Antalgic MSK:  Non tender with full range of motion and good stability and symmetric strength and tone of shoulders, elbows, wrist, hip, and ankles bilaterally.  Back Exam:  Inspection: Unremarkable  Motion: Flexion 45 deg, Extension 25 deg, Side Bending to 35 deg bilaterally,  Rotation to 35 deg bilaterally  SLR laying: positive right XSLR laying: Negative  Palpable tenderness: ender to palpation in the paraspinal musculature.[name] FABER: itghtness bilaterally. Sensory  change: Gross sensation intact to all lumbar and sacral dermatomes.  Reflexes: 2+ at both patellar tendons, 2+ at achilles tendons, Babinski's downgoing.  Strength at foot  Plantar-flexion: 5/5 Dorsi-flexion: 5/5 Eversion: 5/5 Inversion: 5/5  Leg strength  Quad: 5/5 Hamstring: 5/5 Hip flexor: 5/5 Hip abductors: 4/5  Gait unremarkable.       Impression and Recommendations:     This case required medical decision making of moderate complexity.      Note: This dictation was prepared with Dragon dictation along with smaller phrase technology. Any transcriptional errors that result from this process are unintentional.

## 2017-06-28 NOTE — Patient Instructions (Signed)
Good to see you  I am glad some better but would continue with another epidural  Cancel the next appointment and see me again 2 weeks after epidural  Lets do Naproxen, 1/2 tramadol and tylenol during the day  See me again in 2 weeks after the epidural

## 2017-06-28 NOTE — Progress Notes (Signed)
Subjective:    Patient ID: Christine [name], female    DOB: 05-22-1951, 66 y.o.   MRN: [id number]  HPI Pt returns for f/u of diabetes mellitus: DM type: Insulin-requiring type 2 Dx'ed: 0865 Complications: TIA Therapy: insulin since 2014, and pioglitizone.   GDM: never. DKA: never.  Severe hypoglycemia: never.  Pancreatitis: never.   Other: She declines multiple daily injections; pioglitizone is for NASH; a trial to convert insulin back to oral rx in 2017 was unsuccessful.  Interval history: She had a steroid injection into the lower back last week.  cbg's increased to the high-100's then, but have since returned to the low-100's.  She takes basaglar, 27 units qd.   Past Medical History:  Diagnosis Date  . Allergic rhinitis   . Diabetes mellitus   . History of recurrent UTIs    [name]  . HTN (hypertension)   . Hyperlipidemia   . Migraine headache    quiescent  . Sleep disorder    [name] May  . TIA (transient ischemic attack)     Past Surgical History:  Procedure Laterality Date  . COLONOSCOPY     Dr Carlean [name]; hemorrhoids  . CORONARY ANGIOPLASTY  1997   for chest pain- negative   . POLYPECTOMY  2002   benign, hyperplastic polyp; rectal bleeding 2008  . TONSILLECTOMY    . TOTAL ABDOMINAL HYSTERECTOMY  1983   BSO for endometriosis  . WISDOM TOOTH EXTRACTION      Social History   Social History  . Marital status: Married    Spouse name: N/A  . Number of children: N/A  . Years of education: N/A   Occupational History  . Building services engineer Staffing   Social History Main Topics  . Smoking status: Never Smoker  . Smokeless tobacco: Never Used  . Alcohol use No  . Drug use: No  . Sexual activity: Not on file   Other Topics Concern  . Not on file   Social History Narrative   Regular exercise- no     Current Outpatient Prescriptions on File Prior to Visit  Medication Sig Dispense Refill  . aspirin 81 MG tablet Take 81 mg by mouth  daily.      [name] atorvastatin (LIPITOR) 40 MG tablet TAKE 1 TABLET (40 MG TOTAL) BY MOUTH DAILY AT 6 PM. 90 tablet 1  . BD PEN NEEDLE NANO U/F 32G X 4 MM MISC USE ONCE A DAY AS DIRECTED 90 each 3  . desvenlafaxine (PRISTIQ) 100 MG 24 hr tablet Take 100 mg by mouth daily.    [name] gabapentin (NEURONTIN) 100 MG capsule Take 2 capsules (200 mg total) by mouth at bedtime. 60 capsule 3  . Insulin Glargine (BASAGLAR KWIKPEN) 100 UNIT/ML SOPN Inject 0.25 mLs (25 Units total) into the skin every morning. 15 mL 2  . Lancets (ONETOUCH ULTRASOFT) lancets Check blood sugar once daily. Dx code: 250.00 100 each 12  . LORazepam (ATIVAN) 0.5 MG tablet Take 0.5 mg by mouth 2 (two) times daily as needed. For anxiety.    [name] losartan (COZAAR) 100 MG tablet TAKE 1 TABLET (100 MG TOTAL) BY MOUTH DAILY. 90 tablet 1  . metroNIDAZOLE (METROGEL) 0.75 % gel Apply 1 application topically 2 (two) times daily. 45 g 0  . omeprazole (PRILOSEC) 20 MG capsule TAKE 1 CAPSULE BY MOUTH DAILY. 30 capsule 6  . ONE TOUCH ULTRA TEST test strip USE 1 STRIP TWICE DAILY DX CODE E11.65 100 each 2  .  pioglitazone (ACTOS) 30 MG tablet TAKE 1 TABLET (30 MG TOTAL) BY MOUTH DAILY.**INS TO PAY 5/11** 90 tablet 3  . promethazine (PHENERGAN) 12.5 MG tablet Take 1 tablet (12.5 mg total) by mouth every 8 (eight) hours as needed for nausea or vomiting. 20 tablet 3  . traMADol (ULTRAM) 50 MG tablet Take 1 tablet (50 mg total) by mouth every 12 (twelve) hours. 60 tablet 0  . Vitamin D, Ergocalciferol, (DRISDOL) 50000 units CAPS capsule TAKE 1 CAPSULE (50,000 UNITS TOTAL) BY MOUTH EVERY 7 (SEVEN) DAYS. 12 capsule 0   No current facility-administered medications on file prior to visit.     Allergies  Allergen Reactions  . Vioxx [Rofecoxib] Other (See Comments)    Excess BP which caused TIA   . Prednisone Other (See Comments)    Mental status changes No associated rash or fever  . Colesevelam Other (See Comments)    Leg Pain    Family History  Problem  Relation Age of Onset  . Colon cancer Maternal Aunt   . Diabetes Paternal Uncle   . Heart attack Father 73  . COPD Mother   . Lung cancer Brother        smoker  . Mental illness Unknown        niece, committed suicide.  [name] Heart attack Unknown        brother X 2; @ 31 & 89  . Stroke Neg Hx     BP 130/84   Pulse 86   Ht 5\' 6"  (1.676 m)   Wt 186 lb 9.6 oz (84.6 kg)   LMP  (LMP Unknown)   SpO2 96%   BMI 30.12 kg/m   Review of Systems She denies hypoglycemia.     Objective:   Physical Exam VITAL SIGNS:  See vs page GENERAL: no distress. Pulses: foot pulses are intact bilaterally.   MSK: no deformity of the feet or ankles.  CV: no edema of the legs or ankles, but there are bilat vv's Skin:  no ulcer on the feet or ankles.  normal color and temp on the feet and ankles Neuro: sensation is intact to touch on the feet and ankles.    Lab Results  Component Value Date   HGBA1C 7.0 06/28/2017      Assessment & Plan:  Insulin-requiring type 2 DM, with TIA.  well-controlled Edema, resolved.  We discussed.  She can continue pioglitizone. Back pain: in view of recent steroid injection, she can reduce insulin back to 25/d. Goal a1c is in the 7's.   Patient Instructions  check your blood sugar twice a day.  vary the time of day when you check, between before the 3 meals, and at bedtime.  also check if you have symptoms of your blood sugar being too high or too low.  please keep a record of the readings and bring it to your next appointment here.  You can write it on any piece of paper.  please call us sooner if your blood sugar goes below 70, or if you have a lot of readings over 200.   Please take basaglar, 25 units daily, and:  continue the same other medications for diabetes.    Please come back for a follow-up appointment in 4 months.

## 2017-06-28 NOTE — Progress Notes (Signed)
Pre visit review using our clinic review tool, if applicable. No additional management support is needed unless otherwise documented below in the visit note. 

## 2017-06-28 NOTE — Assessment & Plan Note (Signed)
Worsening symptoms. More on the right side. Patient does have an MRI showed potential nerve impingement bilaterally. Patient is a start with icing regimen, we will repeat epidural but this will be her third epidural within the 12 months. This will does not seem to make any significant improvement I do think paient needs to discuss with a surgeon about possible decompression of the nerve and microdiscectomy. With me 2 weeks after injection.

## 2017-06-28 NOTE — Patient Instructions (Addendum)
check your blood sugar twice a day.  vary the time of day when you check, between before the 3 meals, and at bedtime.  also check if you have symptoms of your blood sugar being too high or too low.  please keep a record of the readings and bring it to your next appointment here.  You can write it on any piece of paper.  please call us sooner if your blood sugar goes below 70, or if you have a lot of readings over 200.   Please take basaglar, 25 units daily, and:  continue the same other medications for diabetes.    Please come back for a follow-up appointment in 4 months.

## 2017-07-02 ENCOUNTER — Ambulatory Visit
Admission: RE | Admit: 2017-07-02 | Discharge: 2017-07-02 | Disposition: A | Discharge status: Home / Self Care | Payer: Medicare Other | Source: Ambulatory Visit | Attending: Family Medicine | Admitting: Family Medicine

## 2017-07-02 DIAGNOSIS — M47817 Spondylosis without myelopathy or radiculopathy, lumbosacral region: Secondary | ICD-10-CM | POA: Diagnosis not present

## 2017-07-02 DIAGNOSIS — M5416 Radiculopathy, lumbar region: Secondary | ICD-10-CM

## 2017-07-02 MED ORDER — METHYLPREDNISOLONE ACETATE 40 MG/ML INJ SUSP (RADIOLOG
120.0000 mg | Freq: Once | INTRAMUSCULAR | Status: AC
  Administered 2017-07-02: 120 mg via EPIDURAL

## 2017-07-02 MED ORDER — IOPAMIDOL (ISOVUE-M 200) INJECTION 41%
1.0000 mL | Freq: Once | INTRAMUSCULAR | Status: AC
  Administered 2017-07-02: 1 mL via EPIDURAL

## 2017-07-05 ENCOUNTER — Encounter: Payer: Self-pay | Admitting: Family Medicine

## 2017-07-06 ENCOUNTER — Ambulatory Visit: Payer: Medicare Other | Admitting: Family Medicine

## 2017-07-07 ENCOUNTER — Other Ambulatory Visit: Payer: Self-pay | Admitting: Family Medicine

## 2017-07-07 ENCOUNTER — Other Ambulatory Visit: Payer: Self-pay | Admitting: Internal Medicine

## 2017-07-07 NOTE — Telephone Encounter (Signed)
Refill done.  

## 2017-07-12 ENCOUNTER — Ambulatory Visit (INDEPENDENT_AMBULATORY_CARE_PROVIDER_SITE_OTHER): Payer: Medicare Other | Admitting: Neurology

## 2017-07-12 ENCOUNTER — Encounter: Payer: Self-pay | Admitting: Neurology

## 2017-07-12 VITALS — BP 142/74 | HR 80 | Wt 185.6 lb

## 2017-07-12 DIAGNOSIS — G43009 Migraine without aura, not intractable, without status migrainosus: Secondary | ICD-10-CM

## 2017-07-12 NOTE — Progress Notes (Signed)
NEUROLOGY FOLLOW UP OFFICE NOTE  Christine [name] [id number]  HISTORY OF PRESENT ILLNESS: Christine [name] is a 66 year old left-handed female with cerebrovascular disease, uncontrolled type 2 diabetes, depression and hypertension who follows up for migraine.   UPDATE: Intensity:  7/10 Duration:  8 hours (waits to take tramadol) Frequency:  2-3 migraines over past 6 months.  Has about 3 other jaw pain headaches per month. Current NSAIDS:  Advil (twice a week for jaw pain) Current analgesics:  tramadol 50mg  (once a week for hip pain) Current triptans:  no Current anti-nausea:  Promethazine 12.5mg  Current muscle relaxants:  baclofen 10mg  Antihypertensive medications:  losartan Antidepressant medications:  Pristiq Anticonvulsant medications:  gabapentin Vitamins/Herbal/Supplements:  B12, Fish oil Antihistamines/Decongestants:  no   Depression/anxiety:  no   HISTORY: Onset:  2015-2016 Location:  Right sided, around the right eye radiating to the temple and maxilla Quality:  aching Intensity:  7/10 Aura:  no Prodrome:  no Associated symptoms:  Nausea, right ptosis, photophobia, some tingling around the eye and maxilla.  No visual disturbance Duration:  Gradual onset for several hours later in the day.  Sometimes wakes her up at night Frequency:  2 days per week Triggers/exacerbating factors:  cold, jaw pain Relieving factors:  Heating pad on jaw Activity:  Usually able to function.   She has remote history of migraines up until her 63s or 15s.  These headaches are much different.  She was evaluated by her dentist who found nothing wrong.  She has been told it was trigeminal neuralgia and was previously treated with low dose of gabapentin, which was ineffective.   MRI of brain with and without contrast from 01/23/16 was personally revealed and revealed no acute abnormality but did demonstrate chronic right frontal infarct.   Sed Rate 35, CRP negative  PAST MEDICAL  HISTORY: Past Medical History:  Diagnosis Date  . Allergic rhinitis   . Diabetes mellitus   . History of recurrent UTIs    [name]  . HTN (hypertension)   . Hyperlipidemia   . Migraine headache    quiescent  . Sleep disorder    [name] May  . TIA (transient ischemic attack)     MEDICATIONS: Current Outpatient Prescriptions on File Prior to Visit  Medication Sig Dispense Refill  . aspirin 81 MG tablet Take 81 mg by mouth daily.      [name] atorvastatin (LIPITOR) 40 MG tablet TAKE 1 TABLET (40 MG TOTAL) BY MOUTH DAILY AT 6 PM. 90 tablet 1  . BD PEN NEEDLE NANO U/F 32G X 4 MM MISC USE ONCE A DAY AS DIRECTED 90 each 3  . desvenlafaxine (PRISTIQ) 100 MG 24 hr tablet Take 100 mg by mouth daily.    [name] gabapentin (NEURONTIN) 100 MG capsule Take 2 capsules (200 mg total) by mouth at bedtime. 60 capsule 3  . Insulin Glargine (BASAGLAR KWIKPEN) 100 UNIT/ML SOPN Inject 0.25 mLs (25 Units total) into the skin every morning. 15 mL 2  . Lancets (ONETOUCH ULTRASOFT) lancets Check blood sugar once daily. Dx code: 250.00 100 each 12  . LORazepam (ATIVAN) 0.5 MG tablet Take 0.5 mg by mouth 2 (two) times daily as needed. For anxiety.    [name] losartan (COZAAR) 100 MG tablet TAKE 1 TABLET (100 MG TOTAL) BY MOUTH DAILY. 90 tablet 1  . metroNIDAZOLE (METROGEL) 0.75 % gel Apply 1 application topically 2 (two) times daily. 45 g 0  . omeprazole (PRILOSEC) 20 MG capsule TAKE 1 CAPSULE BY MOUTH  DAILY. 30 capsule 6  . ONE TOUCH ULTRA TEST test strip USE 1 STRIP TWICE DAILY DX CODE E11.65 100 each 2  . pioglitazone (ACTOS) 30 MG tablet TAKE 1 TABLET (30 MG TOTAL) BY MOUTH DAILY.**INS TO PAY 5/11** 90 tablet 3  . promethazine (PHENERGAN) 12.5 MG tablet Take 1 tablet (12.5 mg total) by mouth every 8 (eight) hours as needed for nausea or vomiting. 20 tablet 3  . traMADol (ULTRAM) 50 MG tablet Take 1 tablet (50 mg total) by mouth every 12 (twelve) hours. 60 tablet 0  . Vitamin D, Ergocalciferol, (DRISDOL) 50000 units CAPS  capsule TAKE ONE CAPSULE BY MOUTH EVERY 7 DAYS 12 capsule 0   No current facility-administered medications on file prior to visit.     ALLERGIES: Allergies  Allergen Reactions  . Vioxx [Rofecoxib] Other (See Comments)    Excess BP which caused TIA   . Prednisone Other (See Comments)    Mental status changes No associated rash or fever  . Colesevelam Other (See Comments)    Leg Pain    FAMILY HISTORY: Family History  Problem Relation Age of Onset  . Colon cancer Maternal Aunt   . Diabetes Paternal Uncle   . Heart attack Father 4  . COPD Mother   . Lung cancer Brother        smoker  . Mental illness Unknown        niece, committed suicide.  [name] Heart attack Unknown        brother X 2; @ 74 & 38  . Stroke Neg Hx     SOCIAL HISTORY: Social History   Social History  . Marital status: Married    Spouse name: N/A  . Number of children: N/A  . Years of education: N/A   Occupational History  . Building services engineer Staffing   Social History Main Topics  . Smoking status: Never Smoker  . Smokeless tobacco: Never Used  . Alcohol use No  . Drug use: No  . Sexual activity: Not on file   Other Topics Concern  . Not on file   Social History Narrative   Regular exercise- no     REVIEW OF SYSTEMS: Constitutional: No fevers, chills, or sweats, no generalized fatigue, change in appetite Eyes: No visual changes, double vision, eye pain Ear, nose and throat: No hearing loss, ear pain, nasal congestion, sore throat Cardiovascular: No chest pain, palpitations Respiratory:  No shortness of breath at rest or with exertion, wheezes GastrointestinaI: No nausea, vomiting, diarrhea, abdominal pain, fecal incontinence Genitourinary:  No dysuria, urinary retention or frequency Musculoskeletal:  No neck pain, back pain Integumentary: No rash, pruritus, skin lesions Neurological: as above Psychiatric: No depression, insomnia, anxiety Endocrine: No palpitations,  fatigue, diaphoresis, mood swings, change in appetite, change in weight, increased thirst Hematologic/Lymphatic:  No purpura, petechiae. Allergic/Immunologic: no itchy/runny eyes, nasal congestion, recent allergic reactions, rashes  PHYSICAL EXAM: Vitals:   07/12/17 0924  BP: (!) 142/74  Pulse: 80   General: No acute distress.  Patient appears well-groomed. . Head:  Normocephalic/atraumatic.  Mild tenderness to palpation of left TMJ Eyes:  Fundi examined but not visualized Neck: supple, no paraspinal tenderness, full range of motion Heart:  Regular rate and rhythm Lungs:  Clear to auscultation bilaterally Back: No paraspinal tenderness Neurological Exam: alert and oriented to person, place, and time. Attention span and concentration intact, recent and remote memory intact, fund of knowledge intact.  Speech fluent and not dysarthric, language intact.  CN II-XII intact. Bulk and tone normal, muscle strength 5/5 throughout.  Sensation to light touch intact.  Deep tendon reflexes 2+ throughout, toes downgoing.  Finger to nose and heel to shin testing intact.  Gait normal, Romberg negative.  IMPRESSION: Migraine HTN  PLAN: 1.  Advised to speak with her dentist about a mouth guard. 2.  Will try naproxen 500mg  with promethazine at earliest onset of headache. 3.  Recheck BP with PCP 4.  Follow up in 6 months.  Metta Clines, DO  CC:  Pricilla Holm, MD

## 2017-07-12 NOTE — Patient Instructions (Signed)
1.  When you get a migraine, take naproxen 500mg  with promethazine at earliest onset of headache. 2.  Talk to your dentist about a mouth guard. 3.  Follow up in 6 months.

## 2017-07-19 ENCOUNTER — Ambulatory Visit (INDEPENDENT_AMBULATORY_CARE_PROVIDER_SITE_OTHER): Payer: Medicare Other | Admitting: Family Medicine

## 2017-07-19 ENCOUNTER — Encounter: Payer: Self-pay | Admitting: Family Medicine

## 2017-07-19 VITALS — BP 116/70 | HR 75 | Ht 66.0 in | Wt 184.0 lb

## 2017-07-19 DIAGNOSIS — M999 Biomechanical lesion, unspecified: Secondary | ICD-10-CM

## 2017-07-19 DIAGNOSIS — M5416 Radiculopathy, lumbar region: Secondary | ICD-10-CM | POA: Diagnosis not present

## 2017-07-19 NOTE — Assessment & Plan Note (Signed)
Decision today to treat with OMT was based on Physical Exam  After verbal consent patient was treated with HVLA, ME, FPR techniques in cervical, thoracic, lumbar and sacral areas  Patient tolerated the procedure well with improvement in symptoms  Patient given exercises, stretches and lifestyle modifications  See medications in patient instructions if given  Patient will follow up in 4-6 weeks 

## 2017-07-19 NOTE — Progress Notes (Signed)
[address] [place], [place] [address] Phone: [phone] Subjective:      CC: right low back and leg pain f/u   [id number]  Christine [name] is a 66 y.o. female coming in with complaint of right low back pain. Patient was seen previously and was having radicular symptoms with weakness down the right leg. Patient wanted to try conservative therapy including injections, and medications. Last epidural was 3 weeks ago. Doing relatively well. Still having some discomfort. Has made improvement.     Past Medical History:  Diagnosis Date  . Allergic rhinitis   . Diabetes mellitus   . History of recurrent UTIs    [name]  . HTN (hypertension)   . Hyperlipidemia   . Migraine headache    quiescent  . Sleep disorder    [name] May  . TIA (transient ischemic attack)    Past Surgical History:  Procedure Laterality Date  . COLONOSCOPY     Dr Carlean [name]; hemorrhoids  . CORONARY ANGIOPLASTY  1997   for chest pain- negative   . POLYPECTOMY  2002   benign, hyperplastic polyp; rectal bleeding 2008  . TONSILLECTOMY    . TOTAL ABDOMINAL HYSTERECTOMY  1983   BSO for endometriosis  . WISDOM TOOTH EXTRACTION     Social History   Social History  . Marital status: Married    Spouse name: N/A  . Number of children: N/A  . Years of education: N/A   Occupational History  . Building services engineer Staffing   Social History Main Topics  . Smoking status: Never Smoker  . Smokeless tobacco: Never Used  . Alcohol use No  . Drug use: No  . Sexual activity: Not Asked   Other Topics Concern  . None   Social History Narrative   Regular exercise- no    Allergies  Allergen Reactions  . Vioxx [Rofecoxib] Other (See Comments)    Excess BP which caused TIA   . Prednisone Other (See Comments)    Mental status changes No associated rash or fever  . Colesevelam Other (See Comments)    Leg Pain   Family History  Problem Relation  Age of Onset  . Colon cancer Maternal Aunt   . Diabetes Paternal Uncle   . Heart attack Father 55  . COPD Mother   . Lung cancer Brother        smoker  . Mental illness Unknown        niece, committed suicide.  [name] Heart attack Unknown        brother X 2; @ 41 & 81  . Stroke Neg Hx     Past medical history, social, surgical and family history all reviewed in electronic medical record.  No pertanent information unless stated regarding to the chief complaint.   Review of Systems: No headache, visual changes, nausea, vomiting, diarrhea, constipation, dizziness, abdominal pain, skin rash, fevers, chills, night sweats, weight loss, swollen lymph nodes,  chest pain, shortness of breath, mood changes.  Positive muscle aches but aches   Objective  Blood pressure 116/70, pulse 75, height 5\' 6"  (1.676 m), weight 184 lb (83.5 kg), SpO2 97 %.   Systems examined below as of 07/19/17 General: NAD A&O x3 mood, affect normal  HEENT: Pupils equal, extraocular movements intact no nystagmus Respiratory: not short of breath at rest or with speaking Cardiovascular: No lower extremity edema, non tender Skin: Warm dry intact with no signs of infection  or rash on extremities or on axial skeleton. Abdomen: Soft nontender, no masses Neuro: Cranial nerves  intact, neurovascularly intact in all extremities with 2+ DTRs and 2+ pulses. Lymph: No lymphadenopathy appreciated today  Gait normal with good balance and coordination.  MSK: Non tender with full range of motion and good stability and symmetric strength and tone of shoulders, elbows, wrist,  knee hips and ankles bilaterally.  Arthritic changes of multiple joints Back Exam:  Inspection: Mild loss of lordosis Motion: Flexion 45 deg, Extension 25 deg, Side Bending to 45 deg bilaterally,  Rotation to 45 deg bilaterally  SLR laying: Negative  XSLR laying: Negative  Palpable tenderness: Tender to palpation of the paraspinal musculature of the lumbar  spine. FABER: negative. Sensory change: Gross sensation intact to all lumbar and sacral dermatomes.  Reflexes: 2+ at both patellar tendons, 2+ at achilles tendons, Babinski's downgoing.  Strength at foot  Plantar-flexion: 5/5 Dorsi-flexion: 5/5 Eversion: 5/5 Inversion: 5/5  Leg strength  Quad: 5/5 Hamstring: 5/5 Hip flexor: 5/5 Hip abductors: 5/5  Gait unremarkable.   Osteopathic findings C2 flexed rotated and side bent right C5 flexed rotated and side bent right T3 extended rotated and side bent right inhaled third rib T9 extended rotated and side bent left L3 flexed rotated and side bent right Sacrum right on right      Impression and Recommendations:     This case required medical decision making of moderate complexity.      Note: This dictation was prepared with Dragon dictation along with smaller phrase technology. Any transcriptional errors that result from this process are unintentional.

## 2017-07-19 NOTE — Patient Instructions (Signed)
Good to see you  You are doing great  Lets not change a thing See me again in 6 weeks!

## 2017-07-19 NOTE — Assessment & Plan Note (Signed)
Improvement at this time. We will continue to monitor. We discussed icing regimen and home exercises. We discussed which activities to do a which ones to avoid. Patient will start to increase activity as tolerated. Follow-up again in 4-6 weeks

## 2017-07-22 ENCOUNTER — Other Ambulatory Visit: Payer: Self-pay | Admitting: Internal Medicine

## 2017-07-22 ENCOUNTER — Other Ambulatory Visit: Payer: Self-pay | Admitting: Family Medicine

## 2017-07-22 ENCOUNTER — Other Ambulatory Visit: Payer: Self-pay | Admitting: Endocrinology

## 2017-07-22 NOTE — Telephone Encounter (Signed)
Refill done.  

## 2017-08-04 DIAGNOSIS — R457 State of emotional shock and stress, unspecified: Secondary | ICD-10-CM | POA: Diagnosis not present

## 2017-08-31 ENCOUNTER — Other Ambulatory Visit: Payer: Self-pay | Admitting: Internal Medicine

## 2017-09-01 ENCOUNTER — Ambulatory Visit (INDEPENDENT_AMBULATORY_CARE_PROVIDER_SITE_OTHER): Payer: Medicare Other | Admitting: Family Medicine

## 2017-09-01 ENCOUNTER — Encounter: Payer: Self-pay | Admitting: Family Medicine

## 2017-09-01 VITALS — HR 86 | Wt 184.0 lb

## 2017-09-01 DIAGNOSIS — M797 Fibromyalgia: Secondary | ICD-10-CM

## 2017-09-01 DIAGNOSIS — M999 Biomechanical lesion, unspecified: Secondary | ICD-10-CM

## 2017-09-01 NOTE — Patient Instructions (Signed)
Good to see you  Overall you are doing great  Have a great trip  We will watch the hip  See me again in 4 weeks

## 2017-09-01 NOTE — Assessment & Plan Note (Signed)
Decision today to treat with OMT was based on Physical Exam  After verbal consent patient was treated with HVLA, ME, FPR techniques in cervical, thoracic, rib, lumbar and sacral areas  Patient tolerated the procedure well with improvement in symptoms  Patient given exercises, stretches and lifestyle modifications  See medications in patient instructions if given  Patient will follow up in 4-6 weeks 

## 2017-09-01 NOTE — Progress Notes (Signed)
[name] [place], [place] [address] Phone: [phone] Subjective:     CC: Neck and upper back pain  [id number]  Christine [name] is a 66 y.o. female coming in with complaint of neck and upper back pain. Patient does have what appears to be more of a chronic pain syndrome. Does have some posture and ergonomics problems. Has responded fairly well to osteopathic manipulation and icing regimen, we discussed which activities to do in which ones to avoid. Patient is to start increasing activity slowly over the course the next several days. Patient had been doing then has noticed some mild discomfort of the right hip area. Nothing much worse than when she's had previously.    Past Medical History:  Diagnosis Date  . Allergic rhinitis   . Diabetes mellitus   . History of recurrent UTIs    [name]  . HTN (hypertension)   . Hyperlipidemia   . Migraine headache    quiescent  . Sleep disorder    [name] May  . TIA (transient ischemic attack)    Past Surgical History:  Procedure Laterality Date  . COLONOSCOPY     Dr Carlean [name]; hemorrhoids  . CORONARY ANGIOPLASTY  1997   for chest pain- negative   . POLYPECTOMY  2002   benign, hyperplastic polyp; rectal bleeding 2008  . TONSILLECTOMY    . TOTAL ABDOMINAL HYSTERECTOMY  1983   BSO for endometriosis  . WISDOM TOOTH EXTRACTION     Social History   Social History  . Marital status: Married    Spouse name: N/A  . Number of children: N/A  . Years of education: N/A   Occupational History  . Building services engineer Staffing   Social History Main Topics  . Smoking status: Never Smoker  . Smokeless tobacco: Never Used  . Alcohol use No  . Drug use: No  . Sexual activity: Not Asked   Other Topics Concern  . None   Social History Narrative   Regular exercise- no    Allergies  Allergen Reactions  . Vioxx [Rofecoxib] Other (See Comments)    Excess BP which caused  TIA   . Prednisone Other (See Comments)    Mental status changes No associated rash or fever  . Colesevelam Other (See Comments)    Leg Pain   Family History  Problem Relation Age of Onset  . Colon cancer Maternal Aunt   . Diabetes Paternal Uncle   . Heart attack Father 73  . COPD Mother   . Lung cancer Brother        smoker  . Mental illness Unknown        niece, committed suicide.  [name] Heart attack Unknown        brother X 2; @ 45 & 59  . Stroke Neg Hx      Past medical history, social, surgical and family history all reviewed in electronic medical record.  No pertanent information unless stated regarding to the chief complaint.   Review of Systems:Review of systems updated and as accurate as of 09/01/17  No headache, visual changes, nausea, vomiting, diarrhea, constipation, dizziness, abdominal pain, skin rash, fevers, chills, night sweats, weight loss, swollen lymph nodes, body aches, joint swelling,  chest pain, shortness of breath, mood changes.  Positive muscle aches Objective  Pulse 86, weight 184 lb (83.5 kg), SpO2 93 %. Systems examined below as of 09/01/17   General: No apparent distress alert and  oriented x3 mood and affect normal, dressed appropriately.  HEENT: Pupils equal, extraocular movements intact  Respiratory: Patient's speak in full sentences and does not appear short of breath  Cardiovascular: No lower extremity edema, non tender, no erythema  Skin: Warm dry intact with no signs of infection or rash on extremities or on axial skeleton.  Abdomen: Soft nontender  Neuro: Cranial nerves II through XII are intact, neurovascularly intact in all extremities with 2+ DTRs and 2+ pulses.  Lymph: No lymphadenopathy of posterior or anterior cervical chain or axillae bilaterally.  Gait normal with good balance and coordination.  MSK:  Non tender with full range of motion and good stability and symmetric strength and tone of shoulders, elbows, wrist, hip, knee and  ankles bilaterally.  Neck: Inspection mild increase in lordosis. No palpable stepoffs. Negative Spurling's maneuver. Mild limitation with rotation and side bending bilaterally Grip strength and sensation normal in bilateral hands Strength good C4 to T1 distribution No sensory change to C4 to T1 Negative Hoffman sign bilaterally Reflexes normal Tightness of the left trapezius  Low back exam shows the patient has tenderness to palpation in the paraspinal musculature lumbar spine on the right side with a positive Faber test negative straight leg test  Osteopathic findings C2 flexed rotated and side bent right C4 flexed rotated and side bent left C7 flexed rotated and side bent left T3 extended rotated and side bent right inhaled third rib T6 extended rotated and side bent left L2 flexed rotated and side bent right Sacrum right on right    Impression and Recommendations:     This case required medical decision making of moderate complexity.      Note: This dictation was prepared with Dragon dictation along with smaller phrase technology. Any transcriptional errors that result from this process are unintentional.

## 2017-09-01 NOTE — Assessment & Plan Note (Signed)
Continues to have some discomfort from time to time. Discussed with patient about icing regimen, home exercises, which activities to do a which ones to avoid. Patient will try to increase activity slowly. Patient will come back and see me again in 4-6 weeks.

## 2017-09-06 ENCOUNTER — Telehealth: Payer: Self-pay | Admitting: Internal Medicine

## 2017-09-06 MED ORDER — TRAMADOL HCL 50 MG PO TABS: 50.0000 mg | ORAL_TABLET | Freq: Two times a day (BID) | ORAL | 0 refills | Status: DC

## 2017-09-06 NOTE — Telephone Encounter (Signed)
Pt called in and would like to know if you could call her back.  She would not give me any other info

## 2017-09-06 NOTE — Telephone Encounter (Signed)
Spoke to pt, she has requested a refill for tramadol.

## 2017-09-27 ENCOUNTER — Other Ambulatory Visit: Payer: Self-pay | Admitting: Family Medicine

## 2017-09-27 NOTE — Telephone Encounter (Signed)
Refill done.  

## 2017-09-28 ENCOUNTER — Other Ambulatory Visit: Payer: Self-pay | Admitting: Endocrinology

## 2017-09-30 ENCOUNTER — Ambulatory Visit (INDEPENDENT_AMBULATORY_CARE_PROVIDER_SITE_OTHER): Payer: Medicare Other | Admitting: Family Medicine

## 2017-09-30 ENCOUNTER — Encounter: Payer: Self-pay | Admitting: Family Medicine

## 2017-09-30 VITALS — BP 124/84 | HR 77 | Ht 66.0 in | Wt 189.0 lb

## 2017-09-30 DIAGNOSIS — M999 Biomechanical lesion, unspecified: Secondary | ICD-10-CM | POA: Diagnosis not present

## 2017-09-30 DIAGNOSIS — M5416 Radiculopathy, lumbar region: Secondary | ICD-10-CM | POA: Diagnosis not present

## 2017-09-30 DIAGNOSIS — Z23 Encounter for immunization: Secondary | ICD-10-CM | POA: Diagnosis not present

## 2017-09-30 MED ORDER — KETOROLAC TROMETHAMINE 60 MG/2ML IM SOLN
60.0000 mg | Freq: Once | INTRAMUSCULAR | Status: AC
  Administered 2017-09-30: 60 mg via INTRAMUSCULAR

## 2017-09-30 NOTE — Patient Instructions (Signed)
Gd to see you  I am sorry you are hurting.  Gave you one injection to help  Consider the cymbalta if making a change  See me again in 3-4 weeks.  Please call me if you need anything.

## 2017-09-30 NOTE — Progress Notes (Signed)
[name] [place], [place] [address] Phone: [phone] Subjective:    I'm seeing this patient by the request  of:    CC: Neck and back pain follow-up  [id number]  Christine [name] is a 66 y.o. female coming in for follow up for back pain. Patient has had difficulty with degenerative disc disease. I do think the patient also had more of a chronic pain syndrome. Patient is having increasing stress in has been traveling recently. This is causing increased discomfort and pain as well. Patient knows that that is unfortunately what happens from time to time. Patient states that it is unrelenting. Starting to affect daily activities. Not affecting her mood      Past Medical History:  Diagnosis Date  . Allergic rhinitis   . Diabetes mellitus   . History of recurrent UTIs    [name]  . HTN (hypertension)   . Hyperlipidemia   . Migraine headache    quiescent  . Sleep disorder    [name] May  . TIA (transient ischemic attack)    Past Surgical History:  Procedure Laterality Date  . COLONOSCOPY     Dr Carlean [name]; hemorrhoids  . CORONARY ANGIOPLASTY  1997   for chest pain- negative   . POLYPECTOMY  2002   benign, hyperplastic polyp; rectal bleeding 2008  . TONSILLECTOMY    . TOTAL ABDOMINAL HYSTERECTOMY  1983   BSO for endometriosis  . WISDOM TOOTH EXTRACTION     Social History   Social History  . Marital status: Married    Spouse name: N/A  . Number of children: N/A  . Years of education: N/A   Occupational History  . Building services engineer Staffing   Social History Main Topics  . Smoking status: Never Smoker  . Smokeless tobacco: Never Used  . Alcohol use No  . Drug use: No  . Sexual activity: Not Asked   Other Topics Concern  . None   Social History Narrative   Regular exercise- no    Allergies  Allergen Reactions  . Vioxx [Rofecoxib] Other (See Comments)    Excess BP which caused TIA   .  Prednisone Other (See Comments)    Mental status changes No associated rash or fever  . Colesevelam Other (See Comments)    Leg Pain   Family History  Problem Relation Age of Onset  . Colon cancer Maternal Aunt   . Diabetes Paternal Uncle   . Heart attack Father 33  . COPD Mother   . Lung cancer Brother        smoker  . Mental illness Unknown        niece, committed suicide.  [name] Heart attack Unknown        brother X 2; @ 30 & 40  . Stroke Neg Hx      Past medical history, social, surgical and family history all reviewed in electronic medical record.  No pertanent information unless stated regarding to the chief complaint.   Review of Systems:Review of systems updated and as accurate as of 09/30/17  No  visual changes, nausea, vomiting, diarrhea, constipation, dizziness, abdominal pain, skin rash, fevers, chills, night sweats, weight loss, swollen lymph nodes, joint swelling, chest pain, shortness of breath,.  Positive muscle aches, body aches, headaches mood changes  Objective  Blood pressure 124/84, pulse 77, height 5\' 6"  (1.676 m), weight 189 lb (85.7 kg), SpO2 98 %. Systems examined below as of  09/30/17   General: No apparent distress alert and oriented x3 mood and affect normal, dressed appropriately.  HEENT: Pupils equal, extraocular movements intact  Respiratory: Patient's speak in full sentences and does not appear short of breath  Cardiovascular: No lower extremity edema, non tender, no erythema  Skin: Warm dry intact with no signs of infection or rash on extremities or on axial skeleton.  Abdomen: Soft nontender  Neuro: Cranial nerves II through XII are intact, neurovascularly intact in all extremities with 2+ DTRs and 2+ pulses.  Lymph: No lymphadenopathy of posterior or anterior cervical chain or axillae bilaterally.  Gait normal with good balance and coordination.  MSK:  Non tender with full range of motion and good stability and symmetric strength and tone of  shoulders, elbows, wrist, hip, knee and ankles bilaterally. Arthritic changes of multiple joints Neck: Inspection mild loss of lordosis. No palpable stepoffs. Negative Spurling's maneuver. Decreasing range lacking the last 5 of extension and sidebending bilaterally Grip strength and sensation normal in bilateral hands Strength good C4 to T1 distribution No sensory change to C4 to T1 Negative Hoffman sign bilaterally Reflexes normal  Back Exam:  Inspection: Mild degenerative scoliosis noted Motion: Flexion 45 deg, Extension 25 deg, Side Bending to 35 deg bilaterally,  Rotation to 45 deg bilaterally  SLR laying: Mild positive right XSLR laying: Negative  Palpable tenderness: Tender to palpation and appears palmar musculature lumbar spine. FABER: Tightness right. Sensory change: Gross sensation intact to all lumbar and sacral dermatomes.  Reflexes: 2+ at both patellar tendons, 2+ at achilles tendons, Babinski's downgoing.  Strength at foot  Plantar-flexion: 5/5 Dorsi-flexion: 5/5 Eversion: 5/5 Inversion: 5/5  Leg strength  Quad: 5/5 Hamstring: 5/5 Hip flexor: 5/5 Hip abductors: 5/5  Gait unremarkable.  Osteopathic findings C2 flexed rotated and side bent right C4 flexed rotated and side bent left C6 flexed rotated and side bent left T3 extended rotated and side bent right inhaled third rib T9 extended rotated and side bent left L2 flexed rotated and side bent right Sacrum right on right    Impression and Recommendations:     This case required medical decision making of moderate complexity.      Note: This dictation was prepared with Dragon dictation along with smaller phrase technology. Any transcriptional errors that result from this process are unintentional.

## 2017-09-30 NOTE — Assessment & Plan Note (Signed)
Has had difficulty previously. Increasing stress seems to be making it worse. Attempted osteopathic manipulation again and given a Toradol injection. We discussed icing regimen, home exercises, which activities to do a which ones to avoid. Patient will come back and see me again in 4-6 weeks.

## 2017-09-30 NOTE — Assessment & Plan Note (Signed)
Decision today to treat with OMT was based on Physical Exam  After verbal consent patient was treated with HVLA, ME, FPR techniques in cervical, thoracic, rib, lumbar and sacral areas  Patient tolerated the procedure well with improvement in symptoms  Patient given exercises, stretches and lifestyle modifications  See medications in patient instructions if given  Patient will follow up in 3-4 weeks 

## 2017-10-03 ENCOUNTER — Other Ambulatory Visit: Payer: Self-pay | Admitting: Endocrinology

## 2017-10-18 ENCOUNTER — Telehealth: Payer: Self-pay

## 2017-10-18 ENCOUNTER — Other Ambulatory Visit: Payer: Self-pay | Admitting: Internal Medicine

## 2017-10-18 NOTE — Telephone Encounter (Signed)
Patient has been unable to get comfortable due to her pain. She is calling to be worked in for an [employer] joint injection. Patient also notes that she is having tingling down the right leg and into her side. Patient worked in Architectural technologist afternoon.

## 2017-10-19 ENCOUNTER — Ambulatory Visit (INDEPENDENT_AMBULATORY_CARE_PROVIDER_SITE_OTHER): Payer: Medicare Other | Admitting: Family Medicine

## 2017-10-19 ENCOUNTER — Encounter: Payer: Self-pay | Admitting: Family Medicine

## 2017-10-19 DIAGNOSIS — M7601 Gluteal tendinitis, right hip: Secondary | ICD-10-CM | POA: Diagnosis not present

## 2017-10-19 HISTORY — DX: Gluteal tendinitis, right hip: M76.01

## 2017-10-19 NOTE — Patient Instructions (Signed)
Good to see you  Christine [name] is your friend.  Lets watch  Keep next week appointment for now but if doing well you can move it back to 3-4 weeks.  Continue everything else.

## 2017-10-19 NOTE — Assessment & Plan Note (Signed)
Injected today.  Patient will watch blood sugars.  Patient could be a candidate for PRP if needed.

## 2017-10-19 NOTE — Progress Notes (Signed)
[name] [address] [name], [place] [address] Phone: [phone] Subjective:    I'm seeing this patient by the request  of:    CC: Low back pain follow-up  [id number]  Christine [name] is a 66 y.o. female coming in with complaint of low back pain.  Has had more of the lumbar radiculopathy.  We have attempted epidurals with some mild to moderate improvement.  Patient has also been responding somewhat to osteopathic manipulation.  Last exam was having worsening symptoms again.  Patient was considering with the other doctor about changing to Cymbalta.  Patient states she continues in the pain more in the right buttocks.  Seems to be unrelenting.  Radiculopathy the patient was to try different injection.  Has had sacroiliac dysfunction as well as some gluteal tendon.      Past Medical History:  Diagnosis Date  . Allergic rhinitis   . Diabetes mellitus   . History of recurrent UTIs    [name]  . HTN (hypertension)   . Hyperlipidemia   . Migraine headache    quiescent  . Sleep disorder    [name] May  . TIA (transient ischemic attack)    Past Surgical History:  Procedure Laterality Date  . COLONOSCOPY     Dr Carlean [name]; hemorrhoids  . CORONARY ANGIOPLASTY  1997   for chest pain- negative   . POLYPECTOMY  2002   benign, hyperplastic polyp; rectal bleeding 2008  . TONSILLECTOMY    . TOTAL ABDOMINAL HYSTERECTOMY  1983   BSO for endometriosis  . WISDOM TOOTH EXTRACTION     Social History   Socioeconomic History  . Marital status: Married    Spouse name: Not on file  . Number of children: Not on file  . Years of education: Not on file  . Highest education level: Not on file  Social Needs  . Financial resource strain: Not on file  . Food insecurity - worry: Not on file  . Food insecurity - inability: Not on file  . Transportation needs - medical: Not on file  . Transportation needs - non-medical: Not on file  Occupational History    . 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 Smoking status: Never Smoker  . Smokeless tobacco: Never Used  Substance and Sexual Activity  . Alcohol use: No  . Drug use: No  . Sexual activity: Not on file  Other Topics Concern  . Not on file  Social History Narrative   Regular exercise- no    Allergies  Allergen Reactions  . Vioxx [Rofecoxib] Other (See Comments)    Excess BP which caused TIA   . Prednisone Other (See Comments)    Mental status changes No associated rash or fever  . Colesevelam Other (See Comments)    Leg Pain   Family History  Problem Relation Age of Onset  . Colon cancer Maternal Aunt   . Diabetes Paternal Uncle   . Heart attack Father 53  . COPD Mother   . Lung cancer Brother        smoker  . Mental illness Unknown        niece, committed suicide.  [name] Heart attack Unknown        brother X 2; @ 86 & 5  . Stroke Neg Hx      Past medical history, social, surgical and family history all reviewed in electronic medical record.  No  pertanent information unless stated regarding to the chief complaint.   Review of Systems:Review of systems updated and as accurate as of 10/19/17  No , visual changes, nausea, vomiting, diarrhea, constipation, dizziness, abdominal pain, skin rash, fevers, chills, night sweats, weight loss, swollen lymph nodes, body aches, joint swelling, chest pain, shortness of breath, mood changes.  Positive headache, muscle aches  Objective  There were no vitals taken for this visit. Systems examined below as of 10/19/17   General: No apparent distress alert and oriented x3 mood and affect normal, dressed appropriately.  HEENT: Pupils equal, extraocular movements intact  Respiratory: Patient's speak in full sentences and does not appear short of breath  Cardiovascular: No lower extremity edema, non tender, no erythema  Skin: Warm dry intact with no signs of infection or rash on extremities  or on axial skeleton.  Abdomen: Soft nontender  Neuro: Cranial nerves II through XII are intact, neurovascularly intact in all extremities with 2+ DTRs and 2+ pulses.  Lymph: No lymphadenopathy of posterior or anterior cervical chain or axillae bilaterally.  Gait normal with good balance and coordination.  MSK:  Non tender with full range of motion and good stability and symmetric strength and tone of shoulders, elbows, wrist, hip, knee and ankles bilaterally.  The patient does have a positive Corky Sox.  Severely tender to palpation over the right piriformis muscle.  Mild tenderness over the right sacroiliac joint.  Positive Corky Sox.  Negative straight leg test.  Mild limitation in all planes of motion of the lower back  Procedure: Real-time Ultrasound Guided Injection of right gluteal tendon Device: GE Logiq Q7 Ultrasound guided injection is preferred based studies that show increased duration, increased effect, greater accuracy, decreased procedural pain, increased response rate, and decreased cost with ultrasound guided versus blind injection.  Verbal informed consent obtained.  Time-out conducted.  Noted no overlying erythema, induration, or other signs of local infection.  Skin prepped in a sterile fashion.  Local anesthesia: Topical Ethyl chloride.  With sterile technique and under real time ultrasound guidance: With a 21-gauge 2 inch needle was injected with a total of 1 cc of 0.5% Marcaine and 1 cc of Kenalog 40 mg/dL into the right gluteal tendon. Completed without difficulty  Pain immediately resolved suggesting accurate placement of the medication.  Advised to call if fevers/chills, erythema, induration, drainage, or persistent bleeding.  Images permanently stored and available for review in the ultrasound unit.  Impression: Technically successful ultrasound guided injection.   Impression and Recommendations:     This case required medical decision making of moderate complexity.       Note: This dictation was prepared with Dragon dictation along with smaller phrase technology. Any transcriptional errors that result from this process are unintentional.

## 2017-10-25 ENCOUNTER — Other Ambulatory Visit: Payer: Self-pay | Admitting: *Deleted

## 2017-10-25 MED ORDER — NAPROXEN 500 MG PO TABS
500.0000 mg | ORAL_TABLET | Freq: Every day | ORAL | 0 refills | Status: DC
Start: 2017-10-25 — End: 2018-01-03

## 2017-10-25 NOTE — Telephone Encounter (Signed)
Refill request for naproxen 537my BID.  Refill done.

## 2017-10-28 ENCOUNTER — Ambulatory Visit (INDEPENDENT_AMBULATORY_CARE_PROVIDER_SITE_OTHER): Payer: Medicare Other | Admitting: Family Medicine

## 2017-10-28 ENCOUNTER — Encounter: Payer: Self-pay | Admitting: Family Medicine

## 2017-10-28 VITALS — BP 130/84 | HR 80 | Ht 66.0 in | Wt 188.0 lb

## 2017-10-28 DIAGNOSIS — M5416 Radiculopathy, lumbar region: Secondary | ICD-10-CM

## 2017-10-28 DIAGNOSIS — M999 Biomechanical lesion, unspecified: Secondary | ICD-10-CM | POA: Diagnosis not present

## 2017-10-28 NOTE — Assessment & Plan Note (Signed)
No radicular symptoms at this time.  Attempted osteopathic manipulation again.  Continue to have pain patient could be sent to pain management or consider possible radiofrequency ablation.  I do feel that the underlying fibromyalgia is also contributing..  Follow-up again in 3-4 weeks.

## 2017-10-28 NOTE — Assessment & Plan Note (Signed)
Decision today to treat with OMT was based on Physical Exam  After verbal consent patient was treated with HVLA, ME, FPR techniques in cervical, thoracic, rib, lumbar and sacral areas  Patient tolerated the procedure well with improvement in symptoms  Patient given exercises, stretches and lifestyle modifications  See medications in patient instructions if given  Patient will follow up in 3-4 weeks 

## 2017-10-28 NOTE — Patient Instructions (Signed)
Good to see you  I am sorry can't quite figure it out Ask brian about change to Cymbalta.  Ideally would be 40-60mg  in the long run but would need you off the pristiq.  Other options we discussed See me again in 4 weeks  Happy holidays!

## 2017-10-28 NOTE — Progress Notes (Signed)
[name] [address] [place], [place] [address] Phone: [phone] Subjective:    I'm seeing this patient by the request  of:    CC: Back and right buttock pain  [id number]  Christine [name] is a 66 y.o. female coming in with complaint of back pain with right radicular symptoms.  Patient has had significant difficulty recently with her back.  Not responding to epidurals.  Attempted a gluteal tendon injection October 19, 2017.  Patient was to increase activity slowly over the course the next several weeks.  Patient states she did not make any significant improvement.  Patient states that unfortunately continues to have pain.  Patient is willing to potentially make some medication changes but wants to talk to her psychiatrist.  Patient will start to increase activity slowly again.  States that if she does anything more than a regular daily activities more pain comes.      Past Medical History:  Diagnosis Date  . Allergic rhinitis   . Diabetes mellitus   . History of recurrent UTIs    [name]  . HTN (hypertension)   . Hyperlipidemia   . Migraine headache    quiescent  . Sleep disorder    [name] May  . TIA (transient ischemic attack)    Past Surgical History:  Procedure Laterality Date  . COLONOSCOPY     Dr Carlean [name]; hemorrhoids  . CORONARY ANGIOPLASTY  1997   for chest pain- negative   . POLYPECTOMY  2002   benign, hyperplastic polyp; rectal bleeding 2008  . TONSILLECTOMY    . TOTAL ABDOMINAL HYSTERECTOMY  1983   BSO for endometriosis  . WISDOM TOOTH EXTRACTION     Social History   Socioeconomic History  . Marital status: Married    Spouse name: Not on file  . Number of children: Not on file  . Years of education: Not on file  . Highest education level: Not on file  Social Needs  . Financial resource strain: Not on file  . Food insecurity - worry: Not on file  . Food insecurity - inability: Not on file  . Transportation  needs - medical: Not on file  . Transportation needs - non-medical: Not on file  Occupational History  . 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 Smoking status: Never Smoker  . Smokeless tobacco: Never Used  Substance and Sexual Activity  . Alcohol use: No  . Drug use: No  . Sexual activity: Not on file  Other Topics Concern  . Not on file  Social History Narrative   Regular exercise- no    Allergies  Allergen Reactions  . Vioxx [Rofecoxib] Other (See Comments)    Excess BP which caused TIA   . Prednisone Other (See Comments)    Mental status changes No associated rash or fever  . Colesevelam Other (See Comments)    Leg Pain   Family History  Problem Relation Age of Onset  . Colon cancer Maternal Aunt   . Diabetes Paternal Uncle   . Heart attack Father 30  . COPD Mother   . Lung cancer Brother        smoker  . Mental illness Unknown        niece, committed suicide.  [name] Heart attack Unknown        brother X 2; @ 72 & 39  . Stroke Neg Hx      Past  medical history, social, surgical and family history all reviewed in electronic medical record.  No pertanent information unless stated regarding to the chief complaint.   Review of Systems:Review of systems updated and as accurate as of 10/28/17  No , visual changes, nausea, vomiting, diarrhea, constipation, dizziness, abdominal pain, skin rash, fevers, chills, night sweats, weight loss, swollen lymph nodes, body aches, joint swelling,  chest pain, shortness of breath, mood changes.  Positive headaches, muscle aches, body aches  Objective  There were no vitals taken for this visit. Systems examined below as of 10/28/17   General: No apparent distress alert and oriented x3 mood and affect normal, dressed appropriately.  HEENT: Pupils equal, extraocular movements intact  Respiratory: Patient's speak in full sentences and does not appear short of breath  Cardiovascular: No  lower extremity edema, non tender, no erythema  Skin: Warm dry intact with no signs of infection or rash on extremities or on axial skeleton.  Abdomen: Soft nontender  Neuro: Cranial nerves II through XII are intact, neurovascularly intact in all extremities with 2+ DTRs and 2+ pulses.  Lymph: No lymphadenopathy of posterior or anterior cervical chain or axillae bilaterally.  Gait normal with good balance and coordination.  MSK:  tender with full range of motion and good stability and symmetric strength and tone of shoulders, elbows, wrist, hip, knee and ankles bilaterally.   Back Exam:  Inspection: Mild loss of lordosis Motion: Flexion 35 deg, Extension 25 deg, Side Bending to 35 deg bilaterally,  Rotation to 35 deg bilaterally  SLR laying: Negative  XSLR laying: Negative  Palpable tenderness: Tender to palpation to the paraspinal musculature lumbar spine right greater than left but diffusely.[name] FABER: Tightness bilaterally. Sensory change: Gross sensation intact to all lumbar and sacral dermatomes.  Reflexes: 2+ at both patellar tendons, 2+ at achilles tendons, Babinski's downgoing.  Strength at foot  Plantar-flexion: 5/5 Dorsi-flexion: 5/5 Eversion: 5/5 Inversion: 5/5  Leg strength  Quad: 5/5 Hamstring: 5/5 Hip flexor: 5/5 Hip abductors: 5/5  Gait unremarkable.  Osteopathic findingsl C2 flexed rotated and side bent right C7 flexed rotated and side bent right  T3 extended rotated and side bent right inhaled third rib T9 extended rotated and side bent left L3 flexed rotated and side bent right Sacrum right on right    Impression and Recommendations:     This case required medical decision making of moderate complexity.      Note: This dictation was prepared with Dragon dictation along with smaller phrase technology. Any transcriptional errors that result from this process are unintentional.

## 2017-10-29 ENCOUNTER — Other Ambulatory Visit: Payer: Self-pay | Admitting: Internal Medicine

## 2017-11-09 NOTE — Progress Notes (Signed)
[name] [address], [place] [address] Phone: [phone] Subjective:    I'm seeing this patient by the request  of:    CC: Right foot pain  [id number]  Christine [name] is a 66 y.o. female coming in with complaint of right foot pain.  Patient states that she started to have pain on Thanksgiving Day. She also has swelling in her ankle but today she states is better than it has been. Patient walks with antalgic gait.  Patient states that the pain is mostly on the lateral aspect of the ankle.  Seems to radiate towards the back.  States over the course of the last couple days is improving slowly.  Patient denies any numbness but sometimes has a radiation down.  States that her back is better.     Past Medical History:  Diagnosis Date  . Allergic rhinitis   . Diabetes mellitus   . History of recurrent UTIs    [name]  . HTN (hypertension)   . Hyperlipidemia   . Migraine headache    quiescent  . Sleep disorder    [name] May  . TIA (transient ischemic attack)    Past Surgical History:  Procedure Laterality Date  . COLONOSCOPY     Dr Carlean [name]; hemorrhoids  . CORONARY ANGIOPLASTY  1997   for chest pain- negative   . POLYPECTOMY  2002   benign, hyperplastic polyp; rectal bleeding 2008  . TONSILLECTOMY    . TOTAL ABDOMINAL HYSTERECTOMY  1983   BSO for endometriosis  . WISDOM TOOTH EXTRACTION     Social History   Socioeconomic History  . Marital status: Married    Spouse name: None  . Number of children: None  . Years of education: None  . Highest education level: None  Social Needs  . Financial resource strain: None  . Food insecurity - worry: None  . Food insecurity - inability: None  . Transportation needs - medical: None  . Transportation needs - non-medical: None  Occupational History  . 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 Smoking status: Never Smoker  .  Smokeless tobacco: Never Used  Substance and Sexual Activity  . Alcohol use: No  . Drug use: No  . Sexual activity: None  Other Topics Concern  . None  Social History Narrative   Regular exercise- no    Allergies  Allergen Reactions  . Vioxx [Rofecoxib] Other (See Comments)    Excess BP which caused TIA   . Prednisone Other (See Comments)    Mental status changes No associated rash or fever  . Colesevelam Other (See Comments)    Leg Pain   Family History  Problem Relation Age of Onset  . Colon cancer Maternal Aunt   . Diabetes Paternal Uncle   . Heart attack Father 44  . COPD Mother   . Lung cancer Brother        smoker  . Mental illness Unknown        niece, committed suicide.  [name] Heart attack Unknown        brother X 2; @ 81 & 24  . Stroke Neg Hx      Past medical history, social, surgical and family history all reviewed in electronic medical record.  No pertanent information unless stated regarding to the chief complaint.   Review of Systems:Review of systems updated and as accurate as of 11/10/17  No  headache, visual changes, nausea, vomiting, diarrhea, constipation, dizziness, abdominal pain, skin rash, fevers, chills, night sweats, weight loss, swollen lymph nodes, body aches, joint swelling, muscle aches, chest pain, shortness of breath, mood changes.   Objective  Blood pressure (!) 142/98, pulse 83, height 5' 6.5" (1.689 m), weight 186 lb (84.4 kg), SpO2 92 %. Systems examined below as of 11/10/17   General: No apparent distress alert and oriented x3 mood and affect normal, dressed appropriately.  HEENT: Pupils equal, extraocular movements intact  Respiratory: Patient's speak in full sentences and does not appear short of breath  Cardiovascular: No lower extremity edema, non tender, no erythema  Skin: Warm dry intact with no signs of infection or rash on extremities or on axial skeleton.  Abdomen: Soft nontender  Neuro: Cranial nerves II through XII are  intact, neurovascularly intact in all extremities with 2+ DTRs and 2+ pulses.  Lymph: No lymphadenopathy of posterior or anterior cervical chain or axillae bilaterally.  Gait mild antalgic gait MSK:  Non tender with full range of motion and good stability and symmetric strength and tone of shoulders, elbows, wrist, hip, knee bilaterally.  Ankle: Right ankle Swelling on the lateral aspect of the ankle Some limited range of motion with supination and plantar flexion. Strength is 5/5 in all directions. Stable lateral and medial ligaments; squeeze test and kleiger test unremarkable; Talar dome nontender; No pain at base of 5th MT; No tenderness over cuboid; No tenderness over N spot or navicular prominence No tenderness on posterior aspects of lateral and medial malleolus Patient peroneal tendons seem to be displaced over the lateral malleolus Negative tarsal tunnel tinel's Able to walk 4 steps. Contralateral ankle unremarkable  MSK US performed of: Right ankle pain This study was ordered, performed, and interpreted by Charlann Boxer D.O.  Foot/Ankle:   Patient does have what appears to be more of a peroneal subluxation.  Patient does have more of a vertical presentation to the tendons and a lateral presentation.  Secondary picture so the patient did have normal alignment after likely manipulation.  IMPRESSION: Peroneal subluxation with successful reduction  Procedure 72094; 15 additional minutes spent for Therapeutic exercises as stated in above notes.  This included exercises focusing on stretching, strengthening, with significant focus on eccentric aspects.   Long term goals include an improvement in range of motion, strength, endurance as well as avoiding reinjury. Patient's frequency would include in 1-2 times a day, 3-5 times a week for a duration of 6-12 weeks. Ankle strengthening that included:  Basic range of motion exercises to allow proper full motion at ankle Stretching of the  lower leg and hamstrings  Theraband exercises for the lower leg - inversion, eversion, dorsiflexion and plantarflexion each to be completed with a theraband Balance exercises to increase proprioception Weight bearing exercises to increase strength and balance  Proper technique shown and discussed handout in great detail with ATC.  All questions were discussed and answered.      Impression and Recommendations:     This case required medical decision making of moderate complexity.      Note: This dictation was prepared with Dragon dictation along with smaller phrase technology. Any transcriptional errors that result from this process are unintentional.        '

## 2017-11-10 ENCOUNTER — Encounter: Payer: Self-pay | Admitting: Family Medicine

## 2017-11-10 ENCOUNTER — Ambulatory Visit (INDEPENDENT_AMBULATORY_CARE_PROVIDER_SITE_OTHER): Payer: Medicare Other | Admitting: Family Medicine

## 2017-11-10 ENCOUNTER — Ambulatory Visit: Payer: Self-pay

## 2017-11-10 VITALS — BP 142/98 | HR 83 | Ht 66.5 in | Wt 186.0 lb

## 2017-11-10 DIAGNOSIS — M79671 Pain in right foot: Secondary | ICD-10-CM

## 2017-11-10 DIAGNOSIS — S9304XA Dislocation of right ankle joint, initial encounter: Secondary | ICD-10-CM

## 2017-11-10 DIAGNOSIS — S93331A Other subluxation of right foot, initial encounter: Secondary | ICD-10-CM | POA: Insufficient documentation

## 2017-11-10 DIAGNOSIS — IMO0001 Reserved for inherently not codable concepts without codable children: Secondary | ICD-10-CM

## 2017-11-10 HISTORY — DX: Other subluxation of right foot, initial encounter: S93.331A

## 2017-11-10 NOTE — Patient Instructions (Signed)
Good to see you  Christine [name] is your friend. Ice 20 minutes 2 times daily. Usually after activity and before bed. Wear the brace daily for a week then with a lot of walking Exercises 3 times a week.  But start on Monday  See me again in 3ish weeks Happy holidays!

## 2017-11-10 NOTE — Assessment & Plan Note (Signed)
Subluxation noted.  We discussed with patient in great length.  We discussed wearing a Aircast, proper shoes, home exercises given.  Patient only has topical anti-inflammatories we discussed icing protocol.  Patient should be much better in 2-3 weeks and will have her follow-up at that time

## 2017-11-18 DIAGNOSIS — R300 Dysuria: Secondary | ICD-10-CM | POA: Diagnosis not present

## 2017-11-19 ENCOUNTER — Encounter: Payer: Self-pay | Admitting: Family Medicine

## 2017-11-24 DIAGNOSIS — R457 State of emotional shock and stress, unspecified: Secondary | ICD-10-CM | POA: Diagnosis not present

## 2017-11-25 ENCOUNTER — Ambulatory Visit: Payer: Medicare Other | Admitting: Family Medicine

## 2017-11-27 NOTE — Progress Notes (Signed)
[name] [address],  [place] [address] Phone: [phone] Subjective:    I'm seeing this patient by the request  of:    CC: Back pain and ankle pain follow-up  [id number]  Christine [name] is a 66 y.o. female coming in with complaint of  Back pain.  Patient has been seen multiple times.  Having some increasing tightness.  Has responded well to osteopathic manipulation previously.  We discussed icing regimen which taking medications intermittently.   Pain.  States that the still swells after a lot of activity.  Past Medical History:  Diagnosis Date  . Allergic rhinitis   . Diabetes mellitus   . History of recurrent UTIs    [name]  . HTN (hypertension)   . Hyperlipidemia   . Migraine headache    quiescent  . Sleep disorder    [name] May  . TIA (transient ischemic attack)    Past Surgical History:  Procedure Laterality Date  . COLONOSCOPY     Dr Carlean [name]; hemorrhoids  . CORONARY ANGIOPLASTY  1997   for chest pain- negative   . POLYPECTOMY  2002   benign, hyperplastic polyp; rectal bleeding 2008  . TONSILLECTOMY    . TOTAL ABDOMINAL HYSTERECTOMY  1983   BSO for endometriosis  . WISDOM TOOTH EXTRACTION     Social History   Socioeconomic History  . Marital status: Married    Spouse name: None  . Number of children: None  . Years of education: None  . Highest education level: None  Social Needs  . Financial resource strain: None  . Food insecurity - worry: None  . Food insecurity - inability: None  . Transportation needs - medical: None  . Transportation needs - non-medical: None  Occupational History  . 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 Smoking status: Never Smoker  . Smokeless tobacco: Never Used  Substance and Sexual Activity  . Alcohol use: No  . Drug use: No  . Sexual activity: None  Other Topics Concern  . None  Social History Narrative   Regular exercise- no    Allergies  Allergen Reactions  . Vioxx [Rofecoxib] Other (See Comments)    Excess BP which caused TIA   . Prednisone Other (See Comments)    Mental status changes No associated rash or fever  . Colesevelam Other (See Comments)    Leg Pain   Family History  Problem Relation Age of Onset  . Colon cancer Maternal Aunt   . Diabetes Paternal Uncle   . Heart attack Father 95  . COPD Mother   . Lung cancer Brother        smoker  . Mental illness Unknown        niece, committed suicide.  [name] Heart attack Unknown        brother X 2; @ 21 & 20  . Stroke Neg Hx      Past medical history, social, surgical and family history all reviewed in electronic medical record.  No pertanent information unless stated regarding to the chief complaint.   Review of Systems:Review of systems updated and as accurate as of 11/29/17  No headache, visual changes, nausea, vomiting, diarrhea, constipation, dizziness, abdominal pain, skin rash, fevers, chills, night sweats, weight loss, swollen lymph nodes, body aches, joint swelling,chest pain, shortness of breath, mood changes.  Positive muscle aches Objective  Blood pressure (!) 140/92, pulse 88, height 5'  6" (1.676 m), weight 193 lb (87.5 kg), SpO2 98 %. Systems examined below as of 11/29/17   General: No apparent distress alert and oriented x3 mood and affect normal, dressed appropriately.  HEENT: Pupils equal, extraocular movements intact  Respiratory: Patient's speak in full sentences and does not appear short of breath  Cardiovascular: No lower extremity edema, non tender, no erythema  Skin: Warm dry intact with no signs of infection or rash on extremities or on axial skeleton.  Abdomen: Soft nontender  Neuro: Cranial nerves II through XII are intact, neurovascularly intact in all extremities with 2+ DTRs and 2+ pulses.  Lymph: No lymphadenopathy of posterior or anterior cervical chain or axillae bilaterally.  Gait normal  with good balance and coordination.  MSK:  Non tender with full range of motion and good stability and symmetric strength and tone of shoulders, elbows, wrist, hip, knees bilaterally.  Arthritic changes of multiple joints Right ankle still shows some mild swelling over the peroneal tendons.  No significant subluxation noted.  More pain over the insertion on the plantar aspect of the foot.  Back exam shows mild increase in kyphosis.  More pain at the cervical thoracic and mid thoracic lumbar junction.  Full range of motion.  More discomfort to palpation overall.  Osteopathic findings C2 flexed rotated and side bent right C4 flexed rotated and side bent left C6 flexed rotated and side bent left T3 extended rotated and side bent right inhaled third rib T9 extended rotated and side bent left L2 flexed rotated and side bent right Sacrum right on right      Impression and Recommendations:     This case required medical decision making of moderate complexity.      Note: This dictation was prepared with Dragon dictation along with smaller phrase technology. Any transcriptional errors that result from this process are unintentional.

## 2017-11-29 ENCOUNTER — Encounter: Payer: Self-pay | Admitting: Family Medicine

## 2017-11-29 ENCOUNTER — Ambulatory Visit (INDEPENDENT_AMBULATORY_CARE_PROVIDER_SITE_OTHER): Payer: Medicare Other | Admitting: Family Medicine

## 2017-11-29 VITALS — BP 140/92 | HR 88 | Ht 66.0 in | Wt 193.0 lb

## 2017-11-29 DIAGNOSIS — M5416 Radiculopathy, lumbar region: Secondary | ICD-10-CM

## 2017-11-29 DIAGNOSIS — M999 Biomechanical lesion, unspecified: Secondary | ICD-10-CM | POA: Diagnosis not present

## 2017-11-29 NOTE — Assessment & Plan Note (Signed)
Continue with radiculopathy previously.  Doing relatively well.  Some increase in tightness recently.  We discussed icing regimen and home exercises.  Encourage core strengthening stability.  Follow-up again 4-6 weeks

## 2017-11-29 NOTE — Assessment & Plan Note (Signed)
Decision today to treat with OMT was based on Physical Exam  After verbal consent patient was treated with HVLA, ME, FPR techniques in cervical, thoracic, lumbar and sacral areas  Patient tolerated the procedure well with improvement in symptoms  Patient given exercises, stretches and lifestyle modifications  See medications in patient instructions if given  Patient will follow up in 4-6 weeks 

## 2017-11-29 NOTE — Patient Instructions (Signed)
Good to see you  Christine [name] is your friend.  Stay active I would wear the brace or hiking boots for next 1-2 weeks still.  See me again in 4 weeks Happy New Year!

## 2017-11-30 ENCOUNTER — Encounter: Payer: Self-pay | Admitting: Family Medicine

## 2017-11-30 MED ORDER — TRAMADOL HCL 50 MG PO TABS: 50.0000 mg | ORAL_TABLET | Freq: Two times a day (BID) | ORAL | 0 refills | Status: DC

## 2017-12-10 ENCOUNTER — Encounter: Payer: Self-pay | Admitting: Internal Medicine

## 2017-12-22 ENCOUNTER — Other Ambulatory Visit: Payer: Self-pay | Admitting: Family Medicine

## 2017-12-28 ENCOUNTER — Encounter: Payer: Self-pay | Admitting: Family Medicine

## 2017-12-28 ENCOUNTER — Ambulatory Visit (INDEPENDENT_AMBULATORY_CARE_PROVIDER_SITE_OTHER): Payer: Medicare Other | Admitting: Family Medicine

## 2017-12-28 VITALS — BP 140/82 | HR 86 | Ht 66.0 in | Wt 191.0 lb

## 2017-12-28 DIAGNOSIS — M5416 Radiculopathy, lumbar region: Secondary | ICD-10-CM | POA: Diagnosis not present

## 2017-12-28 DIAGNOSIS — M999 Biomechanical lesion, unspecified: Secondary | ICD-10-CM | POA: Diagnosis not present

## 2017-12-28 NOTE — Assessment & Plan Note (Signed)
Decision today to treat with OMT was based on Physical Exam  After verbal consent patient was treated with HVLA, ME, FPR techniques in cervical, thoracic, rib, lumbar and sacral areas  Patient tolerated the procedure well with improvement in symptoms  Patient given exercises, stretches and lifestyle modifications  See medications in patient instructions if given  Patient will follow up in 4-6 weeks 

## 2017-12-28 NOTE — Assessment & Plan Note (Signed)
Patient does have more of a lumbar radiculopathy still I think.  Could be more of a greater trochanteric bursitis and patient does with chronic pain syndromes.  Hopefully the Cymbalta will be beneficial.  Attempted osteopathic manipulation again.  We discussed icing regimen.  Home exercises.

## 2017-12-28 NOTE — Progress Notes (Signed)
[name] [place], [place] [address] Phone: [phone] Subjective:    CC: Back pain follow-up  [facility]:[id number]  Christine [name] is a 67 y.o. female coming in with complaint of back pain and hip pain.  Has been seen multiple times.  Patient feels that she is just going to have to live with it we discussed with patient about icing regimen and home exercises.  Patient has been doing it occasionally.  Has responded fairly well to osteopathic manipulation.  Patient does not want any surgical intervention and does not want any injections at this time.  She did have primary care provider changed her to Cymbalta and will see if that makes any improvement with some of her chronic pain.     Past Medical History:  Diagnosis Date  . Allergic rhinitis   . Diabetes mellitus   . History of recurrent UTIs    [name]  . HTN (hypertension)   . Hyperlipidemia   . Migraine headache    quiescent  . Sleep disorder    [name] May  . TIA (transient ischemic attack)    Past Surgical History:  Procedure Laterality Date  . COLONOSCOPY     Dr Carlean [name]; hemorrhoids  . CORONARY ANGIOPLASTY  1997   for chest pain- negative   . POLYPECTOMY  2002   benign, hyperplastic polyp; rectal bleeding 2008  . TONSILLECTOMY    . TOTAL ABDOMINAL HYSTERECTOMY  1983   BSO for endometriosis  . WISDOM TOOTH EXTRACTION     Social History   Socioeconomic History  . Marital status: Married    Spouse name: None  . Number of children: None  . Years of education: None  . Highest education level: None  Social Needs  . Financial resource strain: None  . Food insecurity - worry: None  . Food insecurity - inability: None  . Transportation needs - medical: None  . Transportation needs - non-medical: None  Occupational History  . 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 Smoking status: Never Smoker  . Smokeless tobacco: Never  Used  Substance and Sexual Activity  . Alcohol use: No  . Drug use: No  . Sexual activity: None  Other Topics Concern  . None  Social History Narrative   Regular exercise- no    Allergies  Allergen Reactions  . Vioxx [Rofecoxib] Other (See Comments)    Excess BP which caused TIA   . Prednisone Other (See Comments)    Mental status changes No associated rash or fever  . Colesevelam Other (See Comments)    Leg Pain   Family History  Problem Relation Age of Onset  . Colon cancer Maternal Aunt   . Diabetes Paternal Uncle   . Heart attack Father 65  . COPD Mother   . Lung cancer Brother        smoker  . Mental illness Unknown        niece, committed suicide.  [name] Heart attack Unknown        brother X 2; @ 31 & 81  . Stroke Neg Hx      Past medical history, social, surgical and family history all reviewed in electronic medical record.  No pertanent information unless stated regarding to the chief complaint.   Review of Systems:Review of systems updated and as accurate as of 12/28/17  No , visual changes, nausea, vomiting, diarrhea, constipation, dizziness, abdominal pain,  skin rash, fevers, chills, night sweats, weight loss, swollen lymph nodes, joint swelling, chest pain, shortness of breath, mood changes.  Positive muscle aches, body aches, headaches  Objective  Blood pressure 140/82, pulse 86, height 5\' 6"  (1.676 m), weight 191 lb (86.6 kg), SpO2 98 %. Systems examined below as of 12/28/17   General: No apparent distress alert and oriented x3 mood and affect normal, dressed appropriately.  HEENT: Pupils equal, extraocular movements intact  Respiratory: Patient's speak in full sentences and does not appear short of breath  Cardiovascular: No lower extremity edema, non tender, no erythema  Skin: Warm dry intact with no signs of infection or rash on extremities or on axial skeleton.  Abdomen: Soft nontender  Neuro: Cranial nerves II through XII are intact, neurovascularly  intact in all extremities with 2+ DTRs and 2+ pulses.  Lymph: No lymphadenopathy of posterior or anterior cervical chain or axillae bilaterally.  Gait normal with good balance and coordination.  MSK:  Non tender with full range of motion and good stability and symmetric strength and tone of shoulders, elbows, wrist, hip, knee and ankles bilaterally.  Back Exam:  Inspection: Mild degenerative scoliosis Motion: Flexion 35 deg, Extension 25 deg, Side Bending to 35 deg bilaterally,  Rotation to 30 deg bilaterally  SLR laying: Negative  XSLR laying: Negative  Palpable tenderness: Tender to palpation more in the paraspinal musculature on the right side, right sacroiliac joint and right greater trochanteric bursa.[name] FABER: negative. Sensory change: Gross sensation intact to all lumbar and sacral dermatomes.  Reflexes: 2+ at both patellar tendons, 2+ at achilles tendons, Babinski's downgoing.  Strength at foot  Plantar-flexion: 5/5 Dorsi-flexion: 5/5 Eversion: 5/5 Inversion: 5/5  Leg strength  Quad: 5/5 Hamstring: 5/5 Hip flexor: 5/5 Hip abductors: 4/5 but symmetric Gait unremarkable.  Osteopathic findings C2 flexed rotated and side bent right C4 flexed rotated and side bent left T3 extended rotated and side bent right inhaled third rib T6 extended rotated and side bent left L3 flexed rotated and side bent right Sacrum right on right    Impression and Recommendations:     This case required medical decision making of moderate complexity.      Note: This dictation was prepared with Dragon dictation along with smaller phrase technology. Any transcriptional errors that result from this process are unintentional.

## 2017-12-28 NOTE — Patient Instructions (Signed)
Happy New Year!  Great to see you  Lets continue to watch the hip  Ice is still a possible friend I hope the cymbalta helps See me again in 3-6 weeks

## 2018-01-03 ENCOUNTER — Other Ambulatory Visit: Payer: Self-pay | Admitting: Family Medicine

## 2018-01-03 NOTE — Telephone Encounter (Signed)
Refill done.  

## 2018-01-04 ENCOUNTER — Other Ambulatory Visit: Payer: Self-pay | Admitting: *Deleted

## 2018-01-04 MED ORDER — LOSARTAN POTASSIUM 100 MG PO TABS: 100.0000 mg | ORAL_TABLET | Freq: Every day | ORAL | 1 refills | Status: DC

## 2018-01-10 ENCOUNTER — Other Ambulatory Visit: Payer: Self-pay | Admitting: Endocrinology

## 2018-01-10 ENCOUNTER — Other Ambulatory Visit: Payer: Self-pay | Admitting: Internal Medicine

## 2018-01-12 ENCOUNTER — Ambulatory Visit (INDEPENDENT_AMBULATORY_CARE_PROVIDER_SITE_OTHER): Payer: Medicare Other | Admitting: Neurology

## 2018-01-12 ENCOUNTER — Encounter: Payer: Self-pay | Admitting: Neurology

## 2018-01-12 VITALS — BP 112/82 | HR 91 | Ht 66.5 in | Wt 191.0 lb

## 2018-01-12 DIAGNOSIS — G43009 Migraine without aura, not intractable, without status migrainosus: Secondary | ICD-10-CM

## 2018-01-12 NOTE — Progress Notes (Signed)
NEUROLOGY FOLLOW UP OFFICE NOTE  [name] [id number]  HISTORY OF PRESENT ILLNESS: Christine [name] is a 67 year old left-handed female with cerebrovascular disease, uncontrolled type 2 diabetes, depression and hypertension who follows up for migraine.   UPDATE: Intensity:  7/10 Duration:  8 hours (waits to take medicine) Frequency:  3 to 4 migraines over past 6 months.  Has about 3 other jaw pain headaches per month. Rescue Protocol:  Takes promethazine with or without naproxen Current NSAIDS:  naproxen 500mg , Advil (twice a week for jaw pain) Current analgesics:  tramadol 50mg  (once a week for hip pain) Current triptans:  no Current anti-nausea:  Promethazine 12.5mg  Current muscle relaxants:  Tizanidine 2mg  Antihypertensive medications:  losartan Antidepressant medications:  Cymbalta 60mg  Anticonvulsant medications:  no Vitamins/Herbal/Supplements:  B12, Fish oil Antihistamines/Decongestants:  no   Depression/anxiety:  no   HISTORY: Onset:  2015-2016 Location:  Right sided, around the right eye radiating to the temple and maxilla Quality:  aching Intensity:  7/10 Aura:  no Prodrome:  no Associated symptoms:  Nausea, right ptosis, photophobia, some tingling around the eye and maxilla.  No visual disturbance Duration:  Gradual onset for several hours later in the day.  Sometimes wakes her up at night Frequency:  2 days per week Triggers/exacerbating factors:  cold, jaw pain Relieving factors:  Heating pad on jaw Activity:  Usually able to function.   She has remote history of migraines up until her 31s or 49s.  These headaches are much different.  She was evaluated by her dentist who found nothing wrong.  She has been told it was trigeminal neuralgia and was previously treated with low dose of gabapentin, which was ineffective.   MRI of brain with and without contrast from 01/23/16 was personally revealed and revealed no acute abnormality but did demonstrate chronic  right frontal infarct.   Sed Rate 35, CRP negative  PAST MEDICAL HISTORY: Past Medical History:  Diagnosis Date  . Allergic rhinitis   . Diabetes mellitus   . History of recurrent UTIs    [name]  . HTN (hypertension)   . Hyperlipidemia   . Migraine headache    quiescent  . Sleep disorder    [name] May  . TIA (transient ischemic attack)     MEDICATIONS: Current Outpatient Medications on File Prior to Visit  Medication Sig Dispense Refill  . aspirin 81 MG tablet Take 81 mg by mouth daily.      [name] atorvastatin (LIPITOR) 40 MG tablet TAKE 1 TABLET BY MOUTH AT 6 PM NEEDS OFFICE VISIT 90 tablet 0  . BD PEN NEEDLE NANO U/F 32G X 4 MM MISC USE ONCE A DAY AS DIRECTED 100 each 3  . desvenlafaxine (PRISTIQ) 100 MG 24 hr tablet Take 100 mg by mouth daily.    . DULoxetine (CYMBALTA) 60 MG capsule Take 60 mg by mouth daily.  4  . Insulin Glargine (BASAGLAR KWIKPEN) 100 UNIT/ML SOPN Inject 0.25 mLs (25 Units total) into the skin every morning. 15 mL 2  . Insulin Glargine (BASAGLAR KWIKPEN) 100 UNIT/ML SOPN INJECT 0.35 MLS (35 UNITS TOTAL) INTO THE SKIN AT BEDTIME. 5 pen 11  . Lancets (ONETOUCH ULTRASOFT) lancets Check blood sugar once daily. Dx code: 250.00 100 each 12  . LORazepam (ATIVAN) 0.5 MG tablet Take 0.5 mg by mouth 2 (two) times daily as needed. For anxiety.    [name] losartan (COZAAR) 100 MG tablet Take 1 tablet (100 mg total) by mouth daily. 90 tablet  1  . metroNIDAZOLE (METROGEL) 0.75 % gel Apply 1 application topically 2 (two) times daily. 45 g 0  . naproxen (NAPROSYN) 500 MG tablet TAKE 1 TABLET (500 MG TOTAL) BY MOUTH 2 (TWO) TIMES DAILY WITH A MEAL. 180 tablet 1  . omeprazole (PRILOSEC) 20 MG capsule Take 1 capsule (20 mg total) daily by mouth. Need annual visit for further refills 30 capsule 2  . ONE TOUCH ULTRA TEST test strip USE 1 STRIP TWICE DAILY DX CODE E11.65 200 each 3  . pioglitazone (ACTOS) 30 MG tablet TAKE 1 TABLET (30 MG TOTAL) BY MOUTH DAILY.**INS TO PAY 5/11** 90  tablet 2  . promethazine (PHENERGAN) 12.5 MG tablet Take 1 tablet (12.5 mg total) by mouth every 8 (eight) hours as needed for nausea or vomiting. 20 tablet 3  . tiZANidine (ZANAFLEX) 2 MG tablet TAKE 1 TABLET BY MOUTH EVERY 8 HOURS AS NEEDED FOR MUSCLE SPASMS 60 tablet 1  . traMADol (ULTRAM) 50 MG tablet Take 1 tablet (50 mg total) by mouth every 12 (twelve) hours. 60 tablet 0  . Vitamin D, Ergocalciferol, (DRISDOL) 50000 units CAPS capsule TAKE ONE CAPSULE BY MOUTH EVERY 7 DAYS 12 capsule 0   No current facility-administered medications on file prior to visit.     ALLERGIES: Allergies  Allergen Reactions  . Vioxx [Rofecoxib] Other (See Comments)    Excess BP which caused TIA   . Prednisone Other (See Comments)    Mental status changes No associated rash or fever  . Colesevelam Other (See Comments)    Leg Pain    FAMILY HISTORY: Family History  Problem Relation Age of Onset  . Colon cancer Maternal Aunt   . Diabetes Paternal Uncle   . Heart attack Father 75  . COPD Mother   . Lung cancer Brother        smoker  . Mental illness Unknown        niece, committed suicide.  [name] Heart attack Unknown        brother X 2; @ 17 & 86  . Stroke Neg Hx     SOCIAL HISTORY: Social History   Socioeconomic History  . Marital status: Married    Spouse name: Not on file  . Number of children: Not on file  . Years of education: Not on file  . Highest education level: Not on file  Social Needs  . Financial resource strain: Not on file  . Food insecurity - worry: Not on file  . Food insecurity - inability: Not on file  . Transportation needs - medical: Not on file  . Transportation needs - non-medical: Not on file  Occupational History  . 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 Smoking status: Never Smoker  . Smokeless tobacco: Never Used  Substance and Sexual Activity  . Alcohol use: No  . Drug use: No  . Sexual activity: Not on  file  Other Topics Concern  . Not on file  Social History Narrative   Regular exercise- no     REVIEW OF SYSTEMS: Constitutional: No fevers, chills, or sweats, no generalized fatigue, change in appetite Eyes: No visual changes, double vision, eye pain Ear, nose and throat: No hearing loss, ear pain, nasal congestion, sore throat Cardiovascular: No chest pain, palpitations Respiratory:  No shortness of breath at rest or with exertion, wheezes GastrointestinaI: No nausea, vomiting, diarrhea, abdominal pain, fecal incontinence Genitourinary:  No dysuria, urinary retention or frequency  Musculoskeletal:  Left hip pain Integumentary: No rash, pruritus, skin lesions Neurological: as above Psychiatric: No depression, insomnia, anxiety Endocrine: No palpitations, fatigue, diaphoresis, mood swings, change in appetite, change in weight, increased thirst Hematologic/Lymphatic:  No purpura, petechiae. Allergic/Immunologic: no itchy/runny eyes, nasal congestion, recent allergic reactions, rashes  PHYSICAL EXAM: Vitals:   01/12/18 1054  BP: 112/82  Pulse: 91  SpO2: 97%   General: No acute distress.  Patient appears well-groomed.  Head:  Normocephalic/atraumatic Eyes:  Fundi examined but not visualized Neck: supple, no paraspinal tenderness, full range of motion Heart:  Regular rate and rhythm Lungs:  Clear to auscultation bilaterally Back: No paraspinal tenderness Neurological Exam: alert and oriented to person, place, and time. Attention span and concentration intact, recent and remote memory intact, fund of knowledge intact.  Speech fluent and not dysarthric, language intact.  CN II-XII intact. Bulk and tone normal, muscle strength 5/5 throughout.  Sensation to light touch  intact.  Deep tendon reflexes 2+ throughout.  Finger to nose testing intact.  Gait normal  IMPRESSION: Migraine (some symptoms similar to trigeminal neuralgia as well)  PLAN: 1.  Continue promethazine/naproxen for  abortive therapy.  May take tramadol if needed. 2.  Follow up in 9 months.  Metta Clines, DO  CC: Pricilla Holm, MD

## 2018-01-12 NOTE — Patient Instructions (Signed)
Follow up in 9 months

## 2018-01-17 ENCOUNTER — Ambulatory Visit: Payer: Medicare Other | Admitting: Internal Medicine

## 2018-01-18 ENCOUNTER — Encounter: Payer: Self-pay | Admitting: Internal Medicine

## 2018-01-18 ENCOUNTER — Ambulatory Visit (INDEPENDENT_AMBULATORY_CARE_PROVIDER_SITE_OTHER): Payer: Medicare Other | Admitting: Internal Medicine

## 2018-01-18 VITALS — BP 130/82 | HR 85 | Temp 98.5°F | Ht 66.5 in | Wt 191.0 lb

## 2018-01-18 DIAGNOSIS — M5416 Radiculopathy, lumbar region: Secondary | ICD-10-CM | POA: Diagnosis not present

## 2018-01-18 DIAGNOSIS — E1169 Type 2 diabetes mellitus with other specified complication: Secondary | ICD-10-CM

## 2018-01-18 DIAGNOSIS — I10 Essential (primary) hypertension: Secondary | ICD-10-CM | POA: Diagnosis not present

## 2018-01-18 DIAGNOSIS — E785 Hyperlipidemia, unspecified: Secondary | ICD-10-CM

## 2018-01-18 MED ORDER — ATORVASTATIN CALCIUM 40 MG PO TABS: ORAL_TABLET | ORAL | 3 refills | Status: DC

## 2018-01-18 NOTE — Patient Instructions (Signed)
We will check the labs today and call you back with the results.    Health Maintenance, Female Adopting a healthy lifestyle and getting preventive care can go a long way to promote health and wellness. Talk with your health care provider about what schedule of regular examinations is right for you. This is a good chance for you to check in with your provider about disease prevention and staying healthy. In between checkups, there are plenty of things you can do on your own. Experts have done a lot of research about which lifestyle changes and preventive measures are most likely to keep you healthy. Ask your health care provider for more information. Weight and diet Eat a healthy diet  Be sure to include plenty of vegetables, fruits, low-fat dairy products, and lean protein.  Do not eat a lot of foods high in solid fats, added sugars, or salt.  Get regular exercise. This is one of the most important things you can do for your health. ? Most adults should exercise for at least 150 minutes each week. The exercise should increase your heart rate and make you sweat (moderate-intensity exercise). ? Most adults should also do strengthening exercises at least twice a week. This is in addition to the moderate-intensity exercise.  Maintain a healthy weight  Body mass index (BMI) is a measurement that can be used to identify possible weight problems. It estimates body fat based on height and weight. Your health care provider can help determine your BMI and help you achieve or maintain a healthy weight.  For females 20 years of age and older: ? A BMI below 18.5 is considered underweight. ? A BMI of 18.5 to 24.9 is normal. ? A BMI of 25 to 29.9 is considered overweight. ? A BMI of 30 and above is considered obese.  Watch levels of cholesterol and blood lipids  You should start having your blood tested for lipids and cholesterol at 69 years of age, then have this test every 5 years.  You may need to  have your cholesterol levels checked more often if: ? Your lipid or cholesterol levels are high. ? You are older than 69 years of age. ? You are at high risk for heart disease.  Cancer screening Lung Cancer  Lung cancer screening is recommended for adults 55-80 years old who are at high risk for lung cancer because of a history of smoking.  A yearly low-dose CT scan of the lungs is recommended for people who: ? Currently smoke. ? Have quit within the past 15 years. ? Have at least a 30-pack-year history of smoking. A pack year is smoking an average of one pack of cigarettes a day for 1 year.  Yearly screening should continue until it has been 15 years since you quit.  Yearly screening should stop if you develop a health problem that would prevent you from having lung cancer treatment.  Breast Cancer  Practice breast self-awareness. This means understanding how your breasts normally appear and feel.  It also means doing regular breast self-exams. Let your health care provider know about any changes, no matter how small.  If you are in your 20s or 30s, you should have a clinical breast exam (CBE) by a health care provider every 1-3 years as part of a regular health exam.  If you are 40 or older, have a CBE every year. Also consider having a breast X-ray (mammogram) every year.  If you have a family history of breast cancer,   to your health care provider about genetic screening.  If you are at high risk for breast cancer, talk to your health care provider about having an MRI and a mammogram every year.  Breast cancer gene (BRCA) assessment is recommended for women who have family members with BRCA-related cancers. BRCA-related cancers include: ? Breast. ? Ovarian. ? Tubal. ? Peritoneal cancers.  Results of the assessment will determine the need for genetic counseling and BRCA1 and BRCA2 testing.  Cervical Cancer Your health care provider may recommend that you be screened  regularly for cancer of the pelvic organs (ovaries, uterus, and vagina). This screening involves a pelvic examination, including checking for microscopic changes to the surface of your cervix (Pap test). You may be encouraged to have this screening done every 3 years, beginning at age 21.  For women ages 30-65, health care providers may recommend pelvic exams and Pap testing every 3 years, or they may recommend the Pap and pelvic exam, combined with testing for human papilloma virus (HPV), every 5 years. Some types of HPV increase your risk of cervical cancer. Testing for HPV may also be done on women of any age with unclear Pap test results.  Other health care providers may not recommend any screening for nonpregnant women who are considered low risk for pelvic cancer and who do not have symptoms. Ask your health care provider if a screening pelvic exam is right for you.  If you have had past treatment for cervical cancer or a condition that could lead to cancer, you need Pap tests and screening for cancer for at least 20 years after your treatment. If Pap tests have been discontinued, your risk factors (such as having a new sexual partner) need to be reassessed to determine if screening should resume. Some women have medical problems that increase the chance of getting cervical cancer. In these cases, your health care provider may recommend more frequent screening and Pap tests.  Colorectal Cancer  This type of cancer can be detected and often prevented.  Routine colorectal cancer screening usually begins at 69 years of age and continues through 69 years of age.  Your health care provider may recommend screening at an earlier age if you have risk factors for colon cancer.  Your health care provider may also recommend using home test kits to check for hidden blood in the stool.  A small camera at the end of a tube can be used to examine your colon directly (sigmoidoscopy or colonoscopy). This is  done to check for the earliest forms of colorectal cancer.  Routine screening usually begins at age 50.  Direct examination of the colon should be repeated every 5-10 years through 69 years of age. However, you may need to be screened more often if early forms of precancerous polyps or small growths are found.  Skin Cancer  Check your skin from head to toe regularly.  Tell your health care provider about any new moles or changes in moles, especially if there is a change in a mole's shape or color.  Also tell your health care provider if you have a mole that is larger than the size of a pencil eraser.  Always use sunscreen. Apply sunscreen liberally and repeatedly throughout the day.  Protect yourself by wearing long sleeves, pants, a wide-brimmed hat, and sunglasses whenever you are outside.  Heart disease, diabetes, and high blood pressure  High blood pressure causes heart disease and increases the risk of stroke. High blood pressure is more   likely to develop in: ? People who have blood pressure in the high end of the normal range (130-139/85-89 mm Hg). ? People who are overweight or obese. ? People who are African American.  If you are 90-95 years of age, have your blood pressure checked every 3-5 years. If you are 4 years of age or older, have your blood pressure checked every year. You should have your blood pressure measured twice-once when you are at a hospital or clinic, and once when you are not at a hospital or clinic. Record the average of the two measurements. To check your blood pressure when you are not at a hospital or clinic, you can use: ? An automated blood pressure machine at a pharmacy. ? A home blood pressure monitor.  If you are between 44 years and 21 years old, ask your health care provider if you should take aspirin to prevent strokes.  Have regular diabetes screenings. This involves taking a blood sample to check your fasting blood sugar level. ? If you are  at a normal weight and have a low risk for diabetes, have this test once every three years after 69 years of age. ? If you are overweight and have a high risk for diabetes, consider being tested at a younger age or more often. Preventing infection Hepatitis B  If you have a higher risk for hepatitis B, you should be screened for this virus. You are considered at high risk for hepatitis B if: ? You were born in a country where hepatitis B is common. Ask your health care provider which countries are considered high risk. ? Your parents were born in a high-risk country, and you have not been immunized against hepatitis B (hepatitis B vaccine). ? You have HIV or AIDS. ? You use needles to inject street drugs. ? You live with someone who has hepatitis B. ? You have had sex with someone who has hepatitis B. ? You get hemodialysis treatment. ? You take certain medicines for conditions, including cancer, organ transplantation, and autoimmune conditions.  Hepatitis C  Blood testing is recommended for: ? Everyone born from [birth year] through 1967. ? Anyone with known risk factors for hepatitis C.  Sexually transmitted infections (STIs)  You should be screened for sexually transmitted infections (STIs) including gonorrhea and chlamydia if: ? You are sexually active and are younger than 69 years of age. ? You are older than 69 years of age and your health care provider tells you that you are at risk for this type of infection. ? Your sexual activity has changed since you were last screened and you are at an increased risk for chlamydia or gonorrhea. Ask your health care provider if you are at risk.  If you do not have HIV, but are at risk, it may be recommended that you take a prescription medicine daily to prevent HIV infection. This is called pre-exposure prophylaxis (PrEP). You are considered at risk if: ? You are sexually active and do not regularly use condoms or know the HIV status of your  partner(s). ? You take drugs by injection. ? You are sexually active with a partner who has HIV.  Talk with your health care provider about whether you are at high risk of being infected with HIV. If you choose to begin PrEP, you should first be tested for HIV. You should then be tested every 3 months for as long as you are taking PrEP. Pregnancy  If you are premenopausal and you may  become pregnant, ask your health care provider about preconception counseling.  If you may become pregnant, take 400 to 800 micrograms (mcg) of folic acid every day.  If you want to prevent pregnancy, talk to your health care provider about birth control (contraception). Osteoporosis and menopause  Osteoporosis is a disease in which the bones lose minerals and strength with aging. This can result in serious bone fractures. Your risk for osteoporosis can be identified using a bone density scan.  If you are 40 years of age or older, or if you are at risk for osteoporosis and fractures, ask your health care provider if you should be screened.  Ask your health care provider whether you should take a calcium or vitamin D supplement to lower your risk for osteoporosis.  Menopause may have certain physical symptoms and risks.  Hormone replacement therapy may reduce some of these symptoms and risks. Talk to your health care provider about whether hormone replacement therapy is right for you. Follow these instructions at home:  Schedule regular health, dental, and eye exams.  Stay current with your immunizations.  Do not use any tobacco products including cigarettes, chewing tobacco, or electronic cigarettes.  If you are pregnant, do not drink alcohol.  If you are breastfeeding, limit how much and how often you drink alcohol.  Limit alcohol intake to no more than 1 drink per day for nonpregnant women. One drink equals 12 ounces of beer, 5 ounces of wine, or 1 ounces of hard liquor.  Do not use street  drugs.  Do not share needles.  Ask your health care provider for help if you need support or information about quitting drugs.  Tell your health care provider if you often feel depressed.  Tell your health care provider if you have ever been abused or do not feel safe at home. This information is not intended to replace advice given to you by your health care provider. Make sure you discuss any questions you have with your health care provider. Document Released: 06/15/2011 Document Revised: 05/07/2016 Document Reviewed: 09/03/2015 Elsevier Interactive Patient Education  [employer].

## 2018-01-18 NOTE — Progress Notes (Signed)
   Subjective:    Patient ID: Christine [name], female    DOB: [birth year], 67 y.o.   MRN: [id number]  HPI The patient is a 67 YO female coming in for follow up of her medical conditions including her cholesterol (taking lipitor 40 mg daily, denies side effects), and her blood pressure (taking losartan and BP at goal today, denies headaches or chest pains or side effects), and her back pain (trying different injections which have had limited success, she is thinking about PT but is worried that this will worsen the problem, she is taking tramadol 2-3 times per week and tizanidine about the same amount, she does not take naproxen regularly but does get relief from this).   Review of Systems  Constitutional: Positive for activity change. Negative for appetite change, chills, fatigue, fever and unexpected weight change.  HENT: Negative.   Eyes: Negative.   Respiratory: Negative.   Cardiovascular: Negative.   Gastrointestinal: Negative.   Musculoskeletal: Positive for arthralgias, back pain and myalgias. Negative for gait problem and joint swelling.  Skin: Negative.   Neurological: Negative.   Psychiatric/Behavioral: Negative.       Objective:   Physical Exam  Constitutional: She is oriented to person, place, and time. She appears well-developed and well-nourished.  HENT:  Head: Normocephalic and atraumatic.  Eyes: EOM are normal.  Neck: Normal range of motion.  Cardiovascular: Normal rate and regular rhythm.  Pulmonary/Chest: Effort normal and breath sounds normal. No respiratory distress. She has no wheezes. She has no rales.  Abdominal: Soft. Bowel sounds are normal. She exhibits no distension. There is no tenderness. There is no rebound.  Musculoskeletal: She exhibits tenderness. She exhibits no edema.  Tenderness lower lumbar region  Neurological: She is alert and oriented to person, place, and time. Coordination normal.  Skin: Skin is warm and dry.  Psychiatric: She has a normal  mood and affect.   Vitals:   01/18/18 1304  BP: 130/82  Pulse: 85  Temp: 98.5 F (36.9 C)  TempSrc: Oral  SpO2: 98%  Weight: 191 lb (86.6 kg)  Height: 5' 6.5" (1.689 m)      Assessment & Plan:

## 2018-01-19 DIAGNOSIS — R457 State of emotional shock and stress, unspecified: Secondary | ICD-10-CM | POA: Diagnosis not present

## 2018-01-21 ENCOUNTER — Other Ambulatory Visit (INDEPENDENT_AMBULATORY_CARE_PROVIDER_SITE_OTHER): Payer: Medicare Other

## 2018-01-21 DIAGNOSIS — E1169 Type 2 diabetes mellitus with other specified complication: Secondary | ICD-10-CM | POA: Diagnosis not present

## 2018-01-21 DIAGNOSIS — E785 Hyperlipidemia, unspecified: Secondary | ICD-10-CM

## 2018-01-21 LAB — CBC
HCT: 38 % (ref 35.0–45.0)
Hemoglobin: 12.6 g/dL (ref 11.7–15.5)
MCH: 29.2 pg (ref 27.0–33.0)
MCHC: 33.2 g/dL (ref 32.0–36.0)
MCV: 88.2 fL (ref 80.0–100.0)
MPV: 9.8 fL (ref 7.5–12.5)
Platelets: 218 10*3/uL (ref 140–400)
RBC: 4.31 10*6/uL (ref 3.80–5.10)
RDW: 12.7 % (ref 11.0–15.0)
WBC: 8.3 10*3/uL (ref 3.8–10.8)

## 2018-01-21 LAB — LIPID PANEL
CHOL/HDL RATIO: 3
Cholesterol: 167 mg/dL (ref 0–200)
HDL: 48.4 mg/dL (ref >39.00)
LDL CALC: 102 mg/dL — AB (ref 0–99)
NONHDL: 118.29
Triglycerides: 82 mg/dL (ref 0.0–149.0)
VLDL: 16.4 mg/dL (ref 0.0–40.0)

## 2018-01-21 LAB — COMPREHENSIVE METABOLIC PANEL
ALK PHOS: 62 U/L (ref 39–117)
ALT: 17 U/L (ref 0–35)
AST: 12 U/L (ref 0–37)
Albumin: 4 g/dL (ref 3.5–5.2)
BILIRUBIN TOTAL: 0.5 mg/dL (ref 0.2–1.2)
BUN: 17 mg/dL (ref 6–23)
CO2: 29 meq/L (ref 19–32)
CREATININE: 0.64 mg/dL (ref 0.40–1.20)
Calcium: 8.9 mg/dL (ref 8.4–10.5)
Chloride: 102 mEq/L (ref 96–112)
GFR: 97.9 mL/min (ref >60.00)
GLUCOSE: 172 mg/dL — AB (ref 70–99)
Potassium: 3.8 mEq/L (ref 3.5–5.1)
SODIUM: 140 meq/L (ref 135–145)
TOTAL PROTEIN: 6.8 g/dL (ref 6.0–8.3)

## 2018-01-21 LAB — TSH: TSH: 2.5 u[IU]/mL (ref 0.35–4.50)

## 2018-01-21 NOTE — Assessment & Plan Note (Signed)
Checking lipid panel and adjust as needed. Goal LDL <70. Taking lipitor 40 mg daily.

## 2018-01-21 NOTE — Assessment & Plan Note (Signed)
BP at goal on losartan and checking CMP and adjust as needed.

## 2018-01-21 NOTE — Assessment & Plan Note (Signed)
Not taking anything regularly for pain. Advised to take naproxen regularly and see if she can stay ahead of the pain. Uses tizanidine and tramadol 1-2 times per week for pain at night time.

## 2018-01-24 NOTE — Progress Notes (Signed)
[name] [address], [place] [address] Phone: [phone] Subjective:    I'm seeing this patient by the request  of:    CC: Back pain follow-up  [id number]  Christine [name] is a 67 y.o. female coming in with complaint of back pain.  Chronic pain it is continuing to have adjustments with some of her medications including the Cymbalta which is now up to 90 mg a day.  Continues to have difficulty even doing daily activity secondary to the discomfort and pain.    Patient did have MRI of the lumbar spine done January 05, 2017.  Found to have very mild disc disease at multiple levels of the lumbar spine with no significant stenosis.  Past Medical History:  Diagnosis Date  . Allergic rhinitis   . Diabetes mellitus   . History of recurrent UTIs    [name]  . HTN (hypertension)   . Hyperlipidemia   . Migraine headache    quiescent  . Sleep disorder    [name] May  . TIA (transient ischemic attack)    Past Surgical History:  Procedure Laterality Date  . COLONOSCOPY     Dr Carlean [name]; hemorrhoids  . CORONARY ANGIOPLASTY  1997   for chest pain- negative   . POLYPECTOMY  2002   benign, hyperplastic polyp; rectal bleeding 2008  . TONSILLECTOMY    . TOTAL ABDOMINAL HYSTERECTOMY  1983   BSO for endometriosis  . WISDOM TOOTH EXTRACTION     Social History   Socioeconomic History  . Marital status: Married    Spouse name: None  . Number of children: None  . Years of education: None  . Highest education level: None  Social Needs  . Financial resource strain: None  . Food insecurity - worry: None  . Food insecurity - inability: None  . Transportation needs - medical: None  . Transportation needs - non-medical: None  Occupational History  . 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 Smoking status: Never Smoker  . Smokeless tobacco: Never Used  Substance and Sexual Activity  .  Alcohol use: No  . Drug use: No  . Sexual activity: None  Other Topics Concern  . None  Social History Narrative   Regular exercise- no    Allergies  Allergen Reactions  . Vioxx [Rofecoxib] Other (See Comments)    Excess BP which caused TIA   . Prednisone Other (See Comments)    Mental status changes No associated rash or fever  . Colesevelam Other (See Comments)    Leg Pain   Family History  Problem Relation Age of Onset  . Colon cancer Maternal Aunt   . Diabetes Paternal Uncle   . Heart attack Father 66  . COPD Mother   . Lung cancer Brother        smoker  . Mental illness Unknown        niece, committed suicide.  [name] Heart attack Unknown        brother X 2; @ 43 & 56  . Stroke Neg Hx      Past medical history, social, surgical and family history all reviewed in electronic medical record.  No pertanent information unless stated regarding to the chief complaint.   Review of Systems:Review of systems updated and as accurate as of 01/25/18  No headache, visual changes, nausea, vomiting, diarrhea, constipation, dizziness, abdominal pain, skin rash, fevers, chills, night sweats,  weight loss, swollen lymph nodes, body aches, joint swelling,  chest pain, shortness of breath, mood changes.  Positive muscle aches  Objective  Blood pressure (!) 150/80, pulse 95, height 5\' 6"  (1.676 m), weight 189 lb (85.7 kg), SpO2 98 %. Systems examined below as of 01/25/18   General: No apparent distress alert and oriented x3 mood and affect normal, dressed appropriately.  HEENT: Pupils equal, extraocular movements intact  Respiratory: Patient's speak in full sentences and does not appear short of breath  Cardiovascular: No lower extremity edema, non tender, no erythema  Skin: Warm dry intact with no signs of infection or rash on extremities or on axial skeleton.  Abdomen: Soft nontender  Neuro: Cranial nerves II through XII are intact, neurovascularly intact in all extremities with 2+ DTRs  and 2+ pulses.  Lymph: No lymphadenopathy of posterior or anterior cervical chain or axillae bilaterally.  Gait normal with good balance and coordination.  MSK:  Mild tender with full range of motion and good stability and symmetric strength and tone of shoulders, elbows, wrist, hip, knee and ankles bilaterally.   Back Exam:  Inspection: Loss of lordosis Motion: Flexion 35 deg, Extension 25 deg, Side Bending to 35 deg bilaterally,  Rotation to 35 deg bilaterally  SLR laying: Negative tightness right XSLR laying: Negative  Palpable tenderness: Tender to palpation the paraspinal musculature diffusely.[name] FABER: Positive right. Sensory change: Gross sensation intact to all lumbar and sacral dermatomes.  Reflexes: 2+ at both patellar tendons, 2+ at achilles tendons, Babinski's downgoing.  Strength at foot  Plantar-flexion: 5/5 Dorsi-flexion: 5/5 Eversion: 5/5 Inversion: 5/5  Leg strength  Quad: 5/5 Hamstring: 5/5 Hip flexor: 5/5 Hip abductors: 4/5  Gait unremarkable.  Osteopathic findings C2 flexed rotated and side bent right C4 flexed rotated and side bent left T3 extended rotated and side bent right inhaled third rib L2 flexed rotated and side bent right Sacrum right on right     Impression and Recommendations:     This case required medical decision making of moderate complexity.      Note: This dictation was prepared with Dragon dictation along with smaller phrase technology. Any transcriptional errors that result from this process are unintentional.

## 2018-01-25 ENCOUNTER — Ambulatory Visit (INDEPENDENT_AMBULATORY_CARE_PROVIDER_SITE_OTHER): Payer: Medicare Other | Admitting: Family Medicine

## 2018-01-25 ENCOUNTER — Encounter: Payer: Self-pay | Admitting: Family Medicine

## 2018-01-25 VITALS — BP 150/80 | HR 95 | Ht 66.0 in | Wt 189.0 lb

## 2018-01-25 DIAGNOSIS — M999 Biomechanical lesion, unspecified: Secondary | ICD-10-CM | POA: Diagnosis not present

## 2018-01-25 DIAGNOSIS — M5416 Radiculopathy, lumbar region: Secondary | ICD-10-CM

## 2018-01-25 DIAGNOSIS — M545 Low back pain: Secondary | ICD-10-CM | POA: Diagnosis not present

## 2018-01-25 NOTE — Assessment & Plan Note (Signed)
Decision today to treat with OMT was based on Physical Exam  After verbal consent patient was treated with HVLA, ME, FPR techniques in cervical, thoracic, lumbar and sacral areas  Patient tolerated the procedure well with improvement in symptoms  Patient given exercises, stretches and lifestyle modifications  See medications in patient instructions if given  Patient will follow up in 4 weeks 

## 2018-01-25 NOTE — Assessment & Plan Note (Signed)
Continue to have pain, no findings though on exam or MRI but did respond to OMT and epidurals previously  Discussed icing regimen.  RTC in 4 weeks

## 2018-01-25 NOTE — Patient Instructions (Signed)
Good to see you  Lets do PT and they will call you  Continue the medicines right now Naproxen 2 times daily from now on.   Ice is your I think If  not better then  Lets get Dr. Maryjean Ka potentially involved.

## 2018-01-26 DIAGNOSIS — R300 Dysuria: Secondary | ICD-10-CM | POA: Diagnosis not present

## 2018-01-26 DIAGNOSIS — N390 Urinary tract infection, site not specified: Secondary | ICD-10-CM | POA: Diagnosis not present

## 2018-01-26 DIAGNOSIS — E119 Type 2 diabetes mellitus without complications: Secondary | ICD-10-CM | POA: Diagnosis not present

## 2018-02-03 ENCOUNTER — Ambulatory Visit (INDEPENDENT_AMBULATORY_CARE_PROVIDER_SITE_OTHER): Payer: Medicare Other | Admitting: Endocrinology

## 2018-02-03 ENCOUNTER — Encounter: Payer: Self-pay | Admitting: Endocrinology

## 2018-02-03 VITALS — BP 150/88 | HR 90 | Wt 192.8 lb

## 2018-02-03 DIAGNOSIS — Z794 Long term (current) use of insulin: Secondary | ICD-10-CM | POA: Diagnosis not present

## 2018-02-03 DIAGNOSIS — E1151 Type 2 diabetes mellitus with diabetic peripheral angiopathy without gangrene: Secondary | ICD-10-CM | POA: Diagnosis not present

## 2018-02-03 LAB — POCT GLYCOSYLATED HEMOGLOBIN (HGB A1C): Hemoglobin A1C: 6.8

## 2018-02-03 MED ORDER — BASAGLAR KWIKPEN 100 UNIT/ML ~~LOC~~ SOPN: 22.0000 [IU] | PEN_INJECTOR | SUBCUTANEOUS | 2 refills | Status: DC

## 2018-02-03 NOTE — Progress Notes (Signed)
Subjective:    Patient ID: Christine [name], female    DOB: [birth year], 67 y.o.   MRN: [id number]  HPI Pt returns for f/u of diabetes mellitus: DM type: Insulin-requiring type 2 Dx'ed: 0254 Complications: TIA Therapy: insulin since 2014, and pioglitizone.   GDM: never. DKA: never.  Severe hypoglycemia: never.  Pancreatitis: never.   Other: She declines multiple daily injections; pioglitizone is for NASH; a trial to convert insulin back to oral rx in 2017 was unsuccessful.  Interval history: no recent steroids  cbg's vary from 81-172.  She takes basaglar, 25 units qd.  Past Medical History:  Diagnosis Date  . Allergic rhinitis   . Diabetes mellitus   . History of recurrent UTIs    [name]  . HTN (hypertension)   . Hyperlipidemia   . Migraine headache    quiescent  . Sleep disorder    [name] May  . TIA (transient ischemic attack)     Past Surgical History:  Procedure Laterality Date  . COLONOSCOPY     Dr Carlean [name]; hemorrhoids  . CORONARY ANGIOPLASTY  1997   for chest pain- negative   . POLYPECTOMY  2002   benign, hyperplastic polyp; rectal bleeding 2008  . TONSILLECTOMY    . TOTAL ABDOMINAL HYSTERECTOMY  1983   BSO for endometriosis  . WISDOM TOOTH EXTRACTION      Social History   Socioeconomic History  . Marital status: Married    Spouse name: Not on file  . Number of children: Not on file  . Years of education: Not on file  . Highest education level: Not on file  Social Needs  . Financial resource strain: Not on file  . Food insecurity - worry: Not on file  . Food insecurity - inability: Not on file  . Transportation needs - medical: Not on file  . Transportation needs - non-medical: Not on file  Occupational History  . 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 Smoking status: Never Smoker  . Smokeless tobacco: Never Used  Substance and Sexual Activity  . Alcohol use: No  . Drug use: No  . Sexual  activity: Not on file  Other Topics Concern  . Not on file  Social History Narrative   Regular exercise- no     Current Outpatient Medications on File Prior to Visit  Medication Sig Dispense Refill  . aspirin 81 MG tablet Take 81 mg by mouth daily.      [name] atorvastatin (LIPITOR) 40 MG tablet TAKE 1 TABLET BY MOUTH AT 6 PM 90 tablet 3  . BD PEN NEEDLE NANO U/F 32G X 4 MM MISC USE ONCE A DAY AS DIRECTED 100 each 3  . desvenlafaxine (PRISTIQ) 100 MG 24 hr tablet Take 100 mg by mouth daily.    . DULoxetine (CYMBALTA) 60 MG capsule Take 90 mg by mouth daily.   4  . Lancets (ONETOUCH ULTRASOFT) lancets Check blood sugar once daily. Dx code: 250.00 100 each 12  . LORazepam (ATIVAN) 0.5 MG tablet Take 0.5 mg by mouth 2 (two) times daily as needed. For anxiety.    [name] losartan (COZAAR) 100 MG tablet Take 1 tablet (100 mg total) by mouth daily. 90 tablet 1  . metroNIDAZOLE (METROGEL) 0.75 % gel Apply 1 application topically 2 (two) times daily. 45 g 0  . naproxen (NAPROSYN) 500 MG tablet TAKE 1 TABLET (500 MG TOTAL) BY MOUTH 2 (TWO) TIMES DAILY WITH A  MEAL. 180 tablet 1  . omeprazole (PRILOSEC) 20 MG capsule Take 1 capsule (20 mg total) daily by mouth. Need annual visit for further refills 30 capsule 2  . ONE TOUCH ULTRA TEST test strip USE 1 STRIP TWICE DAILY DX CODE E11.65 200 each 3  . pioglitazone (ACTOS) 30 MG tablet TAKE 1 TABLET (30 MG TOTAL) BY MOUTH DAILY.**INS TO PAY 5/11** 90 tablet 2  . promethazine (PHENERGAN) 12.5 MG tablet Take 1 tablet (12.5 mg total) by mouth every 8 (eight) hours as needed for nausea or vomiting. 20 tablet 3  . tiZANidine (ZANAFLEX) 2 MG tablet TAKE 1 TABLET BY MOUTH EVERY 8 HOURS AS NEEDED FOR MUSCLE SPASMS 60 tablet 1  . traMADol (ULTRAM) 50 MG tablet Take 1 tablet (50 mg total) by mouth every 12 (twelve) hours. 60 tablet 0  . Vitamin D, Ergocalciferol, (DRISDOL) 50000 units CAPS capsule TAKE ONE CAPSULE BY MOUTH EVERY 7 DAYS 12 capsule 0   No current  facility-administered medications on file prior to visit.     Allergies  Allergen Reactions  . Vioxx [Rofecoxib] Other (See Comments)    Excess BP which caused TIA   . Prednisone Other (See Comments)    Mental status changes No associated rash or fever  . Colesevelam Other (See Comments)    Leg Pain    Family History  Problem Relation Age of Onset  . Colon cancer Maternal Aunt   . Diabetes Paternal Uncle   . Heart attack Father 47  . COPD Mother   . Lung cancer Brother        smoker  . Mental illness Unknown        niece, committed suicide.  [name] Heart attack Unknown        brother X 2; @ 56 & 4  . Stroke Neg Hx     BP (!) 150/88 (BP Location: Left Arm, Patient Position: Sitting, Cuff Size: Normal)   Pulse 90   Wt 192 lb 12.8 oz (87.5 kg)   LMP  (LMP Unknown)   SpO2 97%   BMI 31.12 kg/m    Review of Systems She denies hypoglycemia    Objective:   Physical Exam VITAL SIGNS:  See vs page GENERAL: no distress Pulses: dorsalis pedis intact bilat.   MSK: no deformity of the feet CV: no leg edema.   Skin:  no ulcer on the feet.  normal color and temp on the feet. Neuro: sensation is intact to touch on the feet   Lab Results  Component Value Date   HGBA1C 6.8 02/03/2018      Assessment & Plan:  Insulin-requiring type 2 DM, with TIA: overcontrolled, this is the best control this pt should aim for, given this regimen, which does match insulin to her changing needs throughout the day.   Patient Instructions  check your blood sugar twice a day.  vary the time of day when you check, between before the 3 meals, and at bedtime.  also check if you have symptoms of your blood sugar being too high or too low.  please keep a record of the readings and bring it to your next appointment here.  You can write it on any piece of paper.  please call us sooner if your blood sugar goes below 70, or if you have a lot of readings over 200.   Please reduce the basaglar to 22 units  daily, and:  continue the same other medications for diabetes.    Please  come back for a follow-up appointment in 4 months.

## 2018-02-03 NOTE — Patient Instructions (Addendum)
check your blood sugar twice a day.  vary the time of day when you check, between before the 3 meals, and at bedtime.  also check if you have symptoms of your blood sugar being too high or too low.  please keep a record of the readings and bring it to your next appointment here.  You can write it on any piece of paper.  please call us sooner if your blood sugar goes below 70, or if you have a lot of readings over 200.   Please reduce the basaglar to 22 units daily, and:  continue the same other medications for diabetes.    Please come back for a follow-up appointment in 4 months.

## 2018-02-04 ENCOUNTER — Encounter: Payer: Self-pay | Admitting: Physical Therapy

## 2018-02-04 ENCOUNTER — Ambulatory Visit: Payer: Medicare Other | Attending: Family Medicine | Admitting: Physical Therapy

## 2018-02-04 ENCOUNTER — Other Ambulatory Visit: Payer: Self-pay

## 2018-02-04 DIAGNOSIS — M25551 Pain in right hip: Secondary | ICD-10-CM | POA: Diagnosis not present

## 2018-02-04 DIAGNOSIS — M25552 Pain in left hip: Secondary | ICD-10-CM

## 2018-02-04 DIAGNOSIS — G8929 Other chronic pain: Secondary | ICD-10-CM | POA: Diagnosis not present

## 2018-02-04 DIAGNOSIS — M545 Low back pain: Secondary | ICD-10-CM | POA: Insufficient documentation

## 2018-02-04 NOTE — Therapy (Signed)
[place], [place], [address] Phone: [phone]   Fax:  [phone]  Physical Therapy Evaluation  Patient Details  Name: Christine [name] MRN: [id number] Date of Birth: 29-Oct-1951 Referring Provider: Lyndal Pulley, DO   Encounter Date: 02/04/2018  PT End of Session - 02/04/18 0843    Visit Number  1    Number of Visits  13    Date for PT Re-Evaluation  03/18/18    Authorization Type  [employer]     PT Start Time  0840    PT Stop Time  0928    PT Time Calculation (min)  48 min    Activity Tolerance  Patient tolerated treatment well    Behavior During Therapy  [facility] for tasks assessed/performed       Past Medical History:  Diagnosis Date  . Allergic rhinitis   . Diabetes mellitus   . History of recurrent UTIs    [name]  . HTN (hypertension)   . Hyperlipidemia   . Migraine headache    quiescent  . Sleep disorder    [name] May  . TIA (transient ischemic attack)     Past Surgical History:  Procedure Laterality Date  . COLONOSCOPY     Dr Carlean [name]; hemorrhoids  . CORONARY ANGIOPLASTY  1997   for chest pain- negative   . POLYPECTOMY  2002   benign, hyperplastic polyp; rectal bleeding 2008  . TONSILLECTOMY    . TOTAL ABDOMINAL HYSTERECTOMY  1983   BSO for endometriosis  . WISDOM TOOTH EXTRACTION      There were no vitals filed for this visit.   Subjective Assessment - 02/04/18 0844    Subjective  It started with pain on lateral right hip, had 1 or 2 injections without improvement. Before July 2018 did epidural steriod injection with mild improvement for about 3-4 weeks. July 2018 began having horrible zingers. Saw Dr Tamala Julian and pt reports dx of disk herniation with toradol injection to get her through family visiting. Next ESI was unsuccessful. Just sitting is not painful, being on feet/walking around is painful, I have to drag myself to my car after shopping. Pain to lay on Rt side. Pain in  lat 3-4 weeks goes to lateral right ankle, travelling down lateral leg. Is having issues with Left but I don't think its the same thing- basically the same thing but less intense, no electrical shocks on Left side. Has LBP in lower-middle back when in kitchen and LBP when being up for a few hours.     How long can you sit comfortably?  does not seem limited    How long can you stand comfortably?  10 min    Patient Stated Goals  sleep, house work, shopping, stairs-leading with Rt leg    Currently in Pain?  Yes    Pain Score  4  in standing    Pain Location  Hip    Pain Orientation  Right    Pain Descriptors / Indicators  Burning    Aggravating Factors   palpating bilat great troch, standing, walking, stairs    Pain Relieving Factors  sit, tramadol, ice, heat         [facility] PT Assessment - 02/04/18 0001      Assessment   Medical Diagnosis  LBP    Referring Provider  Lyndal Pulley, DO    Prior Therapy  no      Precautions   Precautions  None  Restrictions   Weight Bearing Restrictions  No      Balance Screen   Has the patient fallen in the past 6 months  Yes    How many times?  1    Has the patient had a decrease in activity level because of a fear of falling?   No    Is the patient reluctant to leave their home because of a fear of falling?   No      Prior Function   Level of Independence  Independent      Cognition   Overall Cognitive Status  Within Functional Limits for tasks assessed      Observation/Other Assessments   Focus on Therapeutic Outcomes (FOTO)   47% limited      Sensation   Additional Comments  occasional pain down lateral leg, denies N/T      ROM / Strength   AROM / PROM / Strength  AROM;Strength      AROM   Overall AROM Comments  lumbar flexion WFL, extension non painful but decreased on Rt side vs Lt when extending at midline      Strength   Overall Strength Comments  not appropriate to test at eval due to notable pelvic rotation       Palpation   Palpation comment  incr TTP to Rt SIJ             Objective measurements completed on examination: See above findings.      [facility] Adult PT Treatment/Exercise - 02/04/18 0001      Exercises   Exercises  Knee/Hip      Knee/Hip Exercises: Stretches   Other Knee/Hip Stretches  piriformis stretch      Knee/Hip Exercises: Seated   Other Seated Knee/Hip Exercises  core engagement      Knee/Hip Exercises: Supine   Other Supine Knee/Hip Exercises  post pelvic tilt    Other Supine Knee/Hip Exercises  pelvic tilt +ball squeeze, + clam green tband      Manual Therapy   Manual Therapy  Muscle Energy Technique    Manual therapy comments  edu in self rolling of hip    Muscle Energy Technique  Rt hip flexors/Lt extensors             PT Education - 02/04/18 1152    Education provided  Yes    Education Details  anatomy of condition, POC, HEP, exercise form/rationale, FOTO    Person(s) Educated  Patient    Methods  Explanation;Demonstration;Tactile cues;Verbal cues;Handout    Comprehension  Verbalized understanding;Need further instruction;Returned demonstration;Verbal cues required;Tactile cues required          PT Long Term Goals - 02/04/18 1158      PT LONG TERM GOAL #1   Title  Pt will be able to navigat stairs step over step with use of hand rail for safety    Baseline  unable to lead with Rt leg, has caused multiple falls    Time  6    Period  Weeks    Status  New    Target Date  03/18/18      PT LONG TERM GOAL #2   Title  Gross LE strength to 5/5 for proper support to lumbopelvic biomechanical chain    Baseline  NT at eval due to notable pelvic rotation    Time  6    Period  Weeks    Status  New    Target Date  03/18/18  PT LONG TERM GOAL #3   Title  Pt will be able to run errands pain <=3/10    Baseline  severe and has to "drag myself" back to my car at the end of the day    Time  6    Period  Weeks    Status  New    Target Date   03/18/18      PT LONG TERM GOAL #4   Title  FOTO to 42% limited to indicate significant improvement in functional ability    Baseline  47% limited at eval    Time  6    Period  Weeks    Status  New    Target Date  03/18/18             Plan - 02/04/18 1152    Clinical Impression Statement  Pt presents to PT with complaints of Rt hip pain that is chronic but really increased in intensity July 2018 of insidious onset. Notable pelvic rotation (Rt post) corrected today with MET and reviewed proper activation of abdominals for lumbopelvic stability. tightness in piriformis and along ITB contributing to pain, lateral hip s/s consistent with bursitis. Pt will benefit from skilled PT in order to improve lumbopelvic stability, decrease pain and reach long term functional goals.     Clinical Presentation  Stable    Clinical Decision Making  Low    Rehab Potential  Good    PT Frequency  2x / week    PT Duration  6 weeks    PT Treatment/Interventions  ADLs/Self Care Home Management;Cryotherapy;Electrical Stimulation;Ultrasound;Traction;Moist Heat;Iontophoresis 68m/ml Dexamethasone;Gait tScientist, forensicTherapeutic activities;Therapeutic exercise;Balance training;Patient/family education;Neuromuscular re-education;Manual techniques;Passive range of motion;Vasopneumatic Device;Taping;Dry needling    PT Next Visit Plan  re-check pelvic rotation, lumbopelvic stability, manual PRN to Rt hip    PT Home Exercise Plan  pelvic tilt, hooklying ball squeeze & clam, avoid crossing legs, piriformis stretch    Consulted and Agree with Plan of Care  Patient       Patient will benefit from skilled therapeutic intervention in order to improve the following deficits and impairments:  Pain, Improper body mechanics, Increased muscle spasms, Decreased activity tolerance, Decreased strength, Difficulty walking  Visit Diagnosis: Chronic right-sided low back pain, with sciatica presence unspecified - Plan: PT  plan of care cert/re-cert  Pain in right hip - Plan: PT plan of care cert/re-cert  Pain in left hip - Plan: PT plan of care cert/re-cert     Problem List Patient Active Problem List   Diagnosis Date Noted  . Subluxation of peroneal tendon of right foot 11/10/2017  . Gluteal tendonitis of right buttock 10/19/2017  . Lumbar radiculopathy, right 06/23/2017  . Closed displaced fracture of fifth metatarsal bone of left foot 03/16/2017  . GERD (gastroesophageal reflux disease) 12/25/2016  . Greater trochanteric bursitis of left hip 06/17/2016  . [place] 04/02/2016  . Nonallopathic lesion of lumbosacral region 01/15/2016  . Greater trochanteric bursitis of right hip 11/27/2015  . Routine general medical examination at a health care facility 11/25/2015  . Nonallopathic lesion of thoracic region 09/26/2014  . Nonallopathic lesion-rib cage 09/26/2014  . Nonallopathic lesion of cervical region 07/19/2014  . Hx of osteopenia 10/11/2012  . Seasonal allergic rhinitis 03/21/2012  . Fibromyalgia 08/12/2011  . Palpitations 04/15/2011  . DEPRESSIVE DISORDER NOT ELSEWHERE CLASSIFIED 03/27/2010  . Vitamin D deficiency 05/25/2008  . Lumbar radiculopathy 04/11/2008  . Essential hypertension 03/14/2008  . TRANSIENT ISCHEMIC ATTACKS, HX OF 02/02/2008  . Hyperlipidemia  01/18/2007    Natori Gudino C. Makeba Delcastillo PT, DPT 02/04/18 12:03 PM   [place], [place], [address] Phone: [phone]   Fax:  [phone]  Name: Christine [name] MRN: [id number] Date of Birth: 10-25-1951

## 2018-02-04 NOTE — Patient Instructions (Signed)
   Roller to hip 5 min each night

## 2018-02-09 ENCOUNTER — Ambulatory Visit: Payer: Medicare Other | Admitting: Physical Therapy

## 2018-02-09 ENCOUNTER — Encounter: Payer: Self-pay | Admitting: Physical Therapy

## 2018-02-09 DIAGNOSIS — M25552 Pain in left hip: Secondary | ICD-10-CM | POA: Diagnosis not present

## 2018-02-09 DIAGNOSIS — M25551 Pain in right hip: Secondary | ICD-10-CM

## 2018-02-09 DIAGNOSIS — M545 Low back pain: Principal | ICD-10-CM

## 2018-02-09 DIAGNOSIS — G8929 Other chronic pain: Secondary | ICD-10-CM

## 2018-02-09 NOTE — Therapy (Signed)
[place], [place], [address] Phone: [phone]   Fax:  [phone]  Physical Therapy Treatment  Patient Details  Name: Christine [name] MRN: [id number] Date of Birth: Dec 16, 1950 Referring Provider: Lyndal Pulley, DO   Encounter Date: 02/09/2018  PT End of Session - 02/09/18 1314    Visit Number  2    Number of Visits  13    Date for PT Re-Evaluation  03/18/18    Authorization Type  [employer]     PT Start Time  1100    PT Stop Time  1150    PT Time Calculation (min)  50 min    Activity Tolerance  Patient tolerated treatment well    Behavior During Therapy  [facility] for tasks assessed/performed       Past Medical History:  Diagnosis Date  . Allergic rhinitis   . Diabetes mellitus   . History of recurrent UTIs    [name]  . HTN (hypertension)   . Hyperlipidemia   . Migraine headache    quiescent  . Sleep disorder    [name] May  . TIA (transient ischemic attack)     Past Surgical History:  Procedure Laterality Date  . COLONOSCOPY     Dr Carlean [name]; hemorrhoids  . CORONARY ANGIOPLASTY  1997   for chest pain- negative   . POLYPECTOMY  2002   benign, hyperplastic polyp; rectal bleeding 2008  . TONSILLECTOMY    . TOTAL ABDOMINAL HYSTERECTOMY  1983   BSO for endometriosis  . WISDOM TOOTH EXTRACTION      There were no vitals filed for this visit.  Subjective Assessment - 02/09/18 1145    Subjective  Pt arriving to therapy reporting 4-5/10 R hip and lateral lumbar pain with some improvement after doing her exercises. Pt did report she has been sitting in the hospital with a friend and hasn't been as active which she believes created some stiffness.      How long can you sit comfortably?  does not seem limited    How long can you stand comfortably?  10 min    Patient Stated Goals  sleep, house work, shopping, stairs-leading with Rt leg    Currently in Pain?  Yes    Pain Score  4     Pain  Location  Back hip    Pain Orientation  Right    Pain Descriptors / Indicators  Aching;Tightness;Burning                      [facility] Adult PT Treatment/Exercise - 02/09/18 0001      Exercises   Exercises  Lumbar;Knee/Hip      Lumbar Exercises: Stretches   Single Knee to Chest Stretch  Right;Left;3 reps;20 seconds      Lumbar Exercises: Supine   Ab Set  5 reps;5 seconds    Clam  10 reps;3 seconds;Limitations    Clam Limitations  red theraband    Bent Knee Raise  10 reps;3 seconds    Bridge  10 reps;5 seconds    Straight Leg Raise  10 reps      Lumbar Exercises: Sidelying   Clam  Right;15 reps;3 seconds      Knee/Hip Exercises: Stretches   Passive Hamstring Stretch  Right;2 reps;30 seconds    Piriformis Stretch  Right;3 reps;30 seconds      Knee/Hip Exercises: Supine   Bridges  AROM;Strengthening;Both;10 reps;Limitations    Bridges Limitations  holding  5 seconds each, instructions for core activation and initial PPT    Straight Leg Raises  10 reps    Other Supine Knee/Hip Exercises  post pelvic tilt      Manual Therapy   Manual Therapy  Passive ROM;Muscle Energy Technique    Manual therapy comments  MTPR to right piriformis and along IT band, rolling along IT band and lateral hamstring tendons    Passive ROM  hamstring stretch and piriform stretch with overpressure assistance    Muscle Energy Technique  Rt hip flexors/Lt extensors             PT Education - 02/09/18 1152    Education provided  Yes    Education Details  Reviewed HEP    Person(s) Educated  Patient    Methods  Explanation    Comprehension  Verbalized understanding          PT Long Term Goals - 02/09/18 1317      PT LONG TERM GOAL #1   Title  Pt will be able to navigat stairs step over step with use of hand rail for safety    Baseline  unable to lead with Rt leg, has caused multiple falls    Period  Weeks    Status  Unable to assess      PT LONG TERM GOAL #2   Title  Gross  LE strength to 5/5 for proper support to lumbopelvic biomechanical chain    Baseline  NT at eval due to notable pelvic rotation    Time  6    Period  Weeks    Status  New      PT LONG TERM GOAL #3   Title  Pt will be able to run errands pain <=3/10    Baseline  severe and has to "drag myself" back to my car at the end of the day    Time  6    Period  Weeks    Status  New      PT LONG TERM GOAL #4   Title  FOTO to 42% limited to indicate significant improvement in functional ability    Baseline  47% limited at eval    Period  Weeks    Status  New            Plan - 02/09/18 1314    Clinical Impression Statement  Pt presents to therapy with complaints of R hip pain and lower back pain which is posteriorly lateral to the right. Pt tolerating all exercises well along with manual therapy . Pt with active trigger points noted along IT band and piriformis with pain noted  over R femoral head. Pt with inflammation noted. At end of session pt reporting 1-2/10 pain. Continue skilled PT to progress toward LTG's.     Rehab Potential  Good    PT Frequency  2x / week    PT Next Visit Plan  re-check pelvic rotation, lumbopelvic stability, manual PRN to Rt hip    PT Home Exercise Plan  pelvic tilt, hooklying ball squeeze & clam, avoid crossing legs, piriformis stretch    Consulted and Agree with Plan of Care  Patient       Patient will benefit from skilled therapeutic intervention in order to improve the following deficits and impairments:  Pain, Improper body mechanics, Increased muscle spasms, Decreased activity tolerance, Decreased strength, Difficulty walking  Visit Diagnosis: Chronic right-sided low back pain, with sciatica presence unspecified  Pain in right hip  Pain in left hip     Problem List Patient Active Problem List   Diagnosis Date Noted  . Subluxation of peroneal tendon of right foot 11/10/2017  . Gluteal tendonitis of right buttock 10/19/2017  . Lumbar  radiculopathy, right 06/23/2017  . Closed displaced fracture of fifth metatarsal bone of left foot 03/16/2017  . GERD (gastroesophageal reflux disease) 12/25/2016  . Greater trochanteric bursitis of left hip 06/17/2016  . Diabetes ([place]) 04/02/2016  . Nonallopathic lesion of lumbosacral region 01/15/2016  . Greater trochanteric bursitis of right hip 11/27/2015  . Routine general medical examination at a health care facility 11/25/2015  . Nonallopathic lesion of thoracic region 09/26/2014  . Nonallopathic lesion-rib cage 09/26/2014  . Nonallopathic lesion of cervical region 07/19/2014  . Hx of osteopenia 10/11/2012  . Seasonal allergic rhinitis 03/21/2012  . Fibromyalgia 08/12/2011  . Palpitations 04/15/2011  . DEPRESSIVE DISORDER NOT ELSEWHERE CLASSIFIED 03/27/2010  . Vitamin D deficiency 05/25/2008  . Lumbar radiculopathy 04/11/2008  . Essential hypertension 03/14/2008  . TRANSIENT ISCHEMIC ATTACKS, HX OF 02/02/2008  . Hyperlipidemia 01/18/2007    Oretha Caprice, MPT 02/09/2018, 1:21 PM  [facility] [address] [place], [place], [address] Phone: [phone]   Fax:  [phone]  Name: Christine [name] MRN: [id number] Date of Birth: 1951/09/30

## 2018-02-10 ENCOUNTER — Ambulatory Visit: Payer: Medicare Other | Admitting: Physical Therapy

## 2018-02-10 ENCOUNTER — Encounter: Payer: Self-pay | Admitting: Physical Therapy

## 2018-02-10 DIAGNOSIS — M25552 Pain in left hip: Secondary | ICD-10-CM | POA: Diagnosis not present

## 2018-02-10 DIAGNOSIS — M25551 Pain in right hip: Secondary | ICD-10-CM

## 2018-02-10 DIAGNOSIS — M545 Low back pain: Secondary | ICD-10-CM | POA: Diagnosis not present

## 2018-02-10 DIAGNOSIS — G8929 Other chronic pain: Secondary | ICD-10-CM | POA: Diagnosis not present

## 2018-02-10 NOTE — Therapy (Signed)
[place], [place], [address] Phone: [phone]   Fax:  [phone]  Physical Therapy Treatment  Patient Details  Name: Christine [name] MRN: [id number] Date of Birth: Jul 27, 1951 Referring Provider: Lyndal Pulley, DO   Encounter Date: 02/10/2018  PT End of Session - 02/10/18 1226    Visit Number  3    Number of Visits  13    Date for PT Re-Evaluation  03/18/18    Authorization Type  [employer]     PT Start Time  1142    PT Stop Time  1233    PT Time Calculation (min)  51 min    Activity Tolerance  Patient tolerated treatment well    Behavior During Therapy  [facility] for tasks assessed/performed       Past Medical History:  Diagnosis Date  . Allergic rhinitis   . Diabetes mellitus   . History of recurrent UTIs    [name]  . HTN (hypertension)   . Hyperlipidemia   . Migraine headache    quiescent  . Sleep disorder    [name] May  . TIA (transient ischemic attack)     Past Surgical History:  Procedure Laterality Date  . COLONOSCOPY     Dr Carlean [name]; hemorrhoids  . CORONARY ANGIOPLASTY  1997   for chest pain- negative   . POLYPECTOMY  2002   benign, hyperplastic polyp; rectal bleeding 2008  . TONSILLECTOMY    . TOTAL ABDOMINAL HYSTERECTOMY  1983   BSO for endometriosis  . WISDOM TOOTH EXTRACTION      There were no vitals filed for this visit.  Subjective Assessment - 02/10/18 1141    Subjective  doing well; "it felt so good when I left here yesterday, but then it tightens."      Patient Stated Goals  sleep, house work, shopping, stairs-leading with Rt leg    Currently in Pain?  Yes    Pain Score  4     Pain Location  Back hip    Pain Orientation  Right    Pain Descriptors / Indicators  Stabbing;Aching no burning at this time    Aggravating Factors   palpating bil greater trochanter, stanidng, walking, stairs    Pain Relieving Factors  sit, tramadol, ice, heat                       [facility] Adult PT Treatment/Exercise - 02/10/18 1147      Lumbar Exercises: Stretches   Hip Flexor Stretch  Right;3 reps;30 seconds      Lumbar Exercises: Aerobic   Nustep  L5 x 6 min      Lumbar Exercises: Supine   Ab Set  10 reps;5 seconds    Bent Knee Raise  10 reps;3 seconds    Bridge  5 seconds;15 reps with isometric hip abduction    Other Supine Lumbar Exercises  isometric hip abdct with strap 15x5 sec    Other Supine Lumbar Exercises  isometric hip ext Rt 10x5sec      Lumbar Exercises: Prone   Straight Leg Raise  10 reps;2 seconds      Modalities   Modalities  Moist Heat      Moist Heat Therapy   Number Minutes Moist Heat  10 Minutes    Moist Heat Location  Hip;Lumbar Spine Rt; in sidelying      Manual Therapy   Manual Therapy  Soft tissue mobilization  Manual therapy comments  MTPR to right piriformis and along IT band, rolling along IT band and lateral hamstring tendons    Soft tissue mobilization  IASTM to Rt glutes/piriformis/TFL             PT Education - 02/09/18 1152    Education provided  Yes    Education Details  Reviewed HEP    Person(s) Educated  Patient    Methods  Explanation    Comprehension  Verbalized understanding          PT Long Term Goals - 02/09/18 1317      PT LONG TERM GOAL #1   Title  Pt will be able to navigat stairs step over step with use of hand rail for safety    Baseline  unable to lead with Rt leg, has caused multiple falls    Period  Weeks    Status  Unable to assess      PT LONG TERM GOAL #2   Title  Gross LE strength to 5/5 for proper support to lumbopelvic biomechanical chain    Baseline  NT at eval due to notable pelvic rotation    Time  6    Period  Weeks    Status  New      PT LONG TERM GOAL #3   Title  Pt will be able to run errands pain <=3/10    Baseline  severe and has to "drag myself" back to my car at the end of the day    Time  6    Period  Weeks    Status  New       PT LONG TERM GOAL #4   Title  FOTO to 42% limited to indicate significant improvement in functional ability    Baseline  47% limited at eval    Period  Weeks    Status  New            Plan - 02/10/18 1226    Clinical Impression Statement  Pt reported no pain following exercises and manual today, and continues to respond well to PT, but at this time benefit is short lived.  Progressing towards goals.     PT Treatment/Interventions  ADLs/Self Care Home Management;Cryotherapy;Electrical Stimulation;Ultrasound;Traction;Moist Heat;Iontophoresis 4mg /ml Dexamethasone;Gait training;Stair training;Therapeutic activities;Therapeutic exercise;Balance training;Patient/family education;Neuromuscular re-education;Manual techniques;Passive range of motion;Vasopneumatic Device;Taping;Dry needling    PT Next Visit Plan  re-check pelvic rotation, lumbopelvic stability, manual PRN to Rt hip    PT Home Exercise Plan  pelvic tilt, hooklying ball squeeze & clam, avoid crossing legs, piriformis stretch    Consulted and Agree with Plan of Care  Patient       Patient will benefit from skilled therapeutic intervention in order to improve the following deficits and impairments:  Pain, Improper body mechanics, Increased muscle spasms, Decreased activity tolerance, Decreased strength, Difficulty walking  Visit Diagnosis: Chronic right-sided low back pain, with sciatica presence unspecified  Pain in right hip  Pain in left hip     Problem List Patient Active Problem List   Diagnosis Date Noted  . Subluxation of peroneal tendon of right foot 11/10/2017  . Gluteal tendonitis of right buttock 10/19/2017  . Lumbar radiculopathy, right 06/23/2017  . Closed displaced fracture of fifth metatarsal bone of left foot 03/16/2017  . GERD (gastroesophageal reflux disease) 12/25/2016  . Greater trochanteric bursitis of left hip 06/17/2016  . Diabetes ([place]) 04/02/2016  . Nonallopathic lesion of lumbosacral  region 01/15/2016  . Greater trochanteric bursitis of  right hip 11/27/2015  . Routine general medical examination at a health care facility 11/25/2015  . Nonallopathic lesion of thoracic region 09/26/2014  . Nonallopathic lesion-rib cage 09/26/2014  . Nonallopathic lesion of cervical region 07/19/2014  . Hx of osteopenia 10/11/2012  . Seasonal allergic rhinitis 03/21/2012  . Fibromyalgia 08/12/2011  . Palpitations 04/15/2011  . DEPRESSIVE DISORDER NOT ELSEWHERE CLASSIFIED 03/27/2010  . Vitamin D deficiency 05/25/2008  . Lumbar radiculopathy 04/11/2008  . Essential hypertension 03/14/2008  . TRANSIENT ISCHEMIC ATTACKS, HX OF 02/02/2008  . Hyperlipidemia 01/18/2007      Laureen Abrahams, PT, DPT 02/10/18 12:28 PM    [facility] [address] [place], [place], [address] Phone: [phone]   Fax:  [phone]  Name: Christine [name] MRN: [id number] Date of Birth: 09-03-1951

## 2018-02-15 DIAGNOSIS — N390 Urinary tract infection, site not specified: Secondary | ICD-10-CM | POA: Diagnosis not present

## 2018-02-15 DIAGNOSIS — R350 Frequency of micturition: Secondary | ICD-10-CM | POA: Diagnosis not present

## 2018-02-15 DIAGNOSIS — E119 Type 2 diabetes mellitus without complications: Secondary | ICD-10-CM | POA: Diagnosis not present

## 2018-02-18 ENCOUNTER — Encounter: Payer: Self-pay | Admitting: Physical Therapy

## 2018-02-18 ENCOUNTER — Ambulatory Visit: Payer: Medicare Other | Attending: Family Medicine | Admitting: Physical Therapy

## 2018-02-18 DIAGNOSIS — M25551 Pain in right hip: Secondary | ICD-10-CM | POA: Diagnosis not present

## 2018-02-18 DIAGNOSIS — G8929 Other chronic pain: Secondary | ICD-10-CM | POA: Insufficient documentation

## 2018-02-18 DIAGNOSIS — M545 Low back pain: Secondary | ICD-10-CM | POA: Diagnosis not present

## 2018-02-18 DIAGNOSIS — M25552 Pain in left hip: Secondary | ICD-10-CM | POA: Insufficient documentation

## 2018-02-18 NOTE — Therapy (Signed)
[place], [place], [address] Phone: [phone]   Fax:  [phone]  Physical Therapy Treatment  Patient Details  Name: Christine [name] MRN: [id number] Date of Birth: 08/03/1951 Referring Provider: Lyndal Pulley, DO   Encounter Date: 02/18/2018  PT End of Session - 02/18/18 1044    Visit Number  4    Number of Visits  13    Date for PT Re-Evaluation  03/18/18    Authorization Type  [employer]     PT Start Time  1020    PT Stop Time  1110    PT Time Calculation (min)  50 min    Activity Tolerance  Patient tolerated treatment well    Behavior During Therapy  [facility] for tasks assessed/performed       Past Medical History:  Diagnosis Date  . Allergic rhinitis   . Diabetes mellitus   . History of recurrent UTIs    [name]  . HTN (hypertension)   . Hyperlipidemia   . Migraine headache    quiescent  . Sleep disorder    [name] May  . TIA (transient ischemic attack)     Past Surgical History:  Procedure Laterality Date  . COLONOSCOPY     Dr Carlean [name]; hemorrhoids  . CORONARY ANGIOPLASTY  1997   for chest pain- negative   . POLYPECTOMY  2002   benign, hyperplastic polyp; rectal bleeding 2008  . TONSILLECTOMY    . TOTAL ABDOMINAL HYSTERECTOMY  1983   BSO for endometriosis  . WISDOM TOOTH EXTRACTION      There were no vitals filed for this visit.  Subjective Assessment - 02/18/18 1033    Subjective  Pt reporting 4/10 pain which seems to have moved from her right hip to her low back. Pt reporting some bruising after rolling techniques last week, however pt reported she brusises easily.     How long can you sit comfortably?  does not seem limited    How long can you stand comfortably?  10 min    Patient Stated Goals  sleep, house work, shopping, stairs-leading with Rt leg    Currently in Pain?  Yes    Pain Score  4     Pain Location  Back    Pain Orientation  Right    Pain Descriptors /  Indicators  Aching    Pain Type  Chronic pain    Pain Onset  More than a month ago                      [facility] Adult PT Treatment/Exercise - 02/18/18 0001      Lumbar Exercises: Supine   Ab Set  10 reps;5 seconds    Bent Knee Raise  5 reps;3 seconds    Bridge  5 seconds;15 reps with isometric hip abduction    Other Supine Lumbar Exercises  isometric hip abdct with strap 15x5 sec    Other Supine Lumbar Exercises  isometric hip ext Rt 10x5sec      Lumbar Exercises: Sidelying   Hip Abduction  Right;10 reps;2 seconds;Limitations    Hip Abduction Limitations  2 sets      Lumbar Exercises: Prone   Straight Leg Raise  10 reps;2 seconds    Other Prone Lumbar Exercises  elbow prop x 60 seconds x 2      Knee/Hip Exercises: Stretches   Piriformis Stretch  Right;3 reps;20 seconds      Knee/Hip  Exercises: Supine   Bridges  AROM;Strengthening;Both;15 reps;Limitations    Bridges Limitations  holding 5 seconds    Other Supine Knee/Hip Exercises  PPT x 10      Knee/Hip Exercises: Sidelying   Other Sidelying Knee/Hip Exercises  Reverse clams 10 x 2 sets      Modalities   Modalities  Moist Heat      Moist Heat Therapy   Number Minutes Moist Heat  10 Minutes    Moist Heat Location  Lumbar Spine;Hip      Manual Therapy   Manual Therapy  Soft tissue mobilization;Passive ROM    Soft tissue mobilization  lumbar paraspinals STW    Passive ROM  hamstring stretch x 2 holding 30 seconds             PT Education - 02/18/18 1034    Education Details  Posture correction    Person(s) Educated  Patient    Methods  Explanation;Demonstration    Comprehension  Verbalized understanding;Returned demonstration          PT Long Term Goals - 02/18/18 1053      PT LONG TERM GOAL #1   Title  Pt will be able to navigat stairs step over step with use of hand rail for safety    Baseline  unable to lead with Rt leg, has caused multiple falls    Time  6    Period  Weeks    Status   Unable to assess      PT LONG TERM GOAL #2   Title  Gross LE strength to 5/5 for proper support to lumbopelvic biomechanical chain    Baseline  NT at eval due to notable pelvic rotation    Time  6    Period  Weeks    Status  On-going      PT LONG TERM GOAL #3   Title  Pt will be able to run errands pain <=3/10    Baseline  severe and has to "drag myself" back to my car at the end of the day    Period  Weeks    Status  New      PT LONG TERM GOAL #4   Title  FOTO to 42% limited to indicate significant improvement in functional ability    Baseline  47% limited at eval    Time  6    Period  Weeks    Status  New            Plan - 02/18/18 1049    Clinical Impression Statement  Pt reported feeling more pain in her low back today and pain feels better in her Right hip. Pt tolerating all exercises well reporting less stiffness at end of session. Continue skilled PT.     Rehab Potential  Good    PT Frequency  2x / week    PT Duration  6 weeks    PT Treatment/Interventions  ADLs/Self Care Home Management;Cryotherapy;Electrical Stimulation;Ultrasound;Traction;Moist Heat;Iontophoresis 4mg /ml Dexamethasone;Gait training;Stair training;Therapeutic activities;Therapeutic exercise;Balance training;Patient/family education;Neuromuscular re-education;Manual techniques;Passive range of motion;Vasopneumatic Device;Taping;Dry needling    PT Next Visit Plan  re-check pelvic rotation, lumbopelvic stability, manual PRN to Rt hip    PT Home Exercise Plan  pelvic tilt, hooklying ball squeeze & clam, avoid crossing legs, piriformis stretch    Consulted and Agree with Plan of Care  Patient       Patient will benefit from skilled therapeutic intervention in order to improve the following deficits and impairments:  Pain, Improper body mechanics, Increased muscle spasms, Decreased activity tolerance, Decreased strength, Difficulty walking  Visit Diagnosis: Chronic right-sided low back pain, with  sciatica presence unspecified  Pain in right hip  Pain in left hip     Problem List Patient Active Problem List   Diagnosis Date Noted  . Subluxation of peroneal tendon of right foot 11/10/2017  . Gluteal tendonitis of right buttock 10/19/2017  . Lumbar radiculopathy, right 06/23/2017  . Closed displaced fracture of fifth metatarsal bone of left foot 03/16/2017  . GERD (gastroesophageal reflux disease) 12/25/2016  . Greater trochanteric bursitis of left hip 06/17/2016  . Diabetes ([place]) 04/02/2016  . Nonallopathic lesion of lumbosacral region 01/15/2016  . Greater trochanteric bursitis of right hip 11/27/2015  . Routine general medical examination at a health care facility 11/25/2015  . Nonallopathic lesion of thoracic region 09/26/2014  . Nonallopathic lesion-rib cage 09/26/2014  . Nonallopathic lesion of cervical region 07/19/2014  . Hx of osteopenia 10/11/2012  . Seasonal allergic rhinitis 03/21/2012  . Fibromyalgia 08/12/2011  . Palpitations 04/15/2011  . DEPRESSIVE DISORDER NOT ELSEWHERE CLASSIFIED 03/27/2010  . Vitamin D deficiency 05/25/2008  . Lumbar radiculopathy 04/11/2008  . Essential hypertension 03/14/2008  . TRANSIENT ISCHEMIC ATTACKS, HX OF 02/02/2008  . Hyperlipidemia 01/18/2007    Oretha Caprice, MPT 02/18/2018, 10:58 AM  [facility] [address] [place], [place], [address] Phone: [phone]   Fax:  [phone]  Name: Christine [name] MRN: [id number] Date of Birth: 1951/09/19

## 2018-02-21 ENCOUNTER — Encounter: Payer: Self-pay | Admitting: Physical Therapy

## 2018-02-21 ENCOUNTER — Ambulatory Visit: Payer: Medicare Other | Admitting: Physical Therapy

## 2018-02-21 DIAGNOSIS — M545 Low back pain: Secondary | ICD-10-CM | POA: Diagnosis not present

## 2018-02-21 DIAGNOSIS — M25551 Pain in right hip: Secondary | ICD-10-CM

## 2018-02-21 DIAGNOSIS — M25552 Pain in left hip: Secondary | ICD-10-CM

## 2018-02-21 DIAGNOSIS — G8929 Other chronic pain: Secondary | ICD-10-CM | POA: Diagnosis not present

## 2018-02-21 NOTE — Progress Notes (Signed)
[name] [place], [place] [address] Phone: [phone] Subjective:    I'm seeing this patient by the request  of:    CC: Back pain follow-up  [id number]  Christine [name] is a 67 y.o. female coming in with complaint of neck and neck pain.  Going to formal physical therapy at this point.  We have been dealing with patient chronic pain with osteopathic manipulation.  Patient is seeing other providers and has had increase Cymbalta to 90 mg daily. She has been going to physical therapy which she said has helped decrease the tightness in the piriformis. Patient does still have pain but the pain has decreased due to therapy. She still cannot sleep on that side.      Past Medical History:  Diagnosis Date  . Allergic rhinitis   . Diabetes mellitus   . History of recurrent UTIs    [name]  . HTN (hypertension)   . Hyperlipidemia   . Migraine headache    quiescent  . Sleep disorder    [name] May  . TIA (transient ischemic attack)    Past Surgical History:  Procedure Laterality Date  . COLONOSCOPY     Dr Carlean [name]; hemorrhoids  . CORONARY ANGIOPLASTY  1997   for chest pain- negative   . POLYPECTOMY  2002   benign, hyperplastic polyp; rectal bleeding 2008  . TONSILLECTOMY    . TOTAL ABDOMINAL HYSTERECTOMY  1983   BSO for endometriosis  . WISDOM TOOTH EXTRACTION     Social History   Socioeconomic History  . Marital status: Married    Spouse name: None  . Number of children: None  . Years of education: None  . Highest education level: None  Social Needs  . Financial resource strain: None  . Food insecurity - worry: None  . Food insecurity - inability: None  . Transportation needs - medical: None  . Transportation needs - non-medical: None  Occupational History  . 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 Smoking status: Never Smoker  . Smokeless tobacco: Never Used    Substance and Sexual Activity  . Alcohol use: No  . Drug use: No  . Sexual activity: None  Other Topics Concern  . None  Social History Narrative   Regular exercise- no    Allergies  Allergen Reactions  . Vioxx [Rofecoxib] Other (See Comments)    Excess BP which caused TIA   . Prednisone Other (See Comments)    Mental status changes No associated rash or fever  . Colesevelam Other (See Comments)    Leg Pain   Family History  Problem Relation Age of Onset  . Colon cancer Maternal Aunt   . Diabetes Paternal Uncle   . Heart attack Father 74  . COPD Mother   . Lung cancer Brother        smoker  . Mental illness Unknown        niece, committed suicide.  [name] Heart attack Unknown        brother X 2; @ 83 & 53  . Stroke Neg Hx      Past medical history, social, surgical and family history all reviewed in electronic medical record.  No pertanent information unless stated regarding to the chief complaint.   Review of Systems:Review of systems updated and as accurate as of 02/22/18  No headache, visual changes, nausea, vomiting, diarrhea, constipation, dizziness, abdominal pain,  skin rash, fevers, chills, night sweats, weight loss, swollen lymph nodes, body aches, joint swelling, muscle aches, chest pain, shortness of breath, mood changes.  Positive muscle aches  Objective  Blood pressure 110/88, pulse 87, height 5\' 6"  (1.676 m), weight 191 lb (86.6 kg), SpO2 96 %. Systems examined below as of 02/22/18   General: No apparent distress alert and oriented x3 mood and affect normal, dressed appropriately.  HEENT: Pupils equal, extraocular movements intact  Respiratory: Patient's speak in full sentences and does not appear short of breath  Cardiovascular: No lower extremity edema, non tender, no erythema  Skin: Warm dry intact with no signs of infection or rash on extremities or on axial skeleton.  Abdomen: Soft nontender  Neuro: Cranial nerves II through XII are intact,  neurovascularly intact in all extremities with 2+ DTRs and 2+ pulses.  Lymph: No lymphadenopathy of posterior or anterior cervical chain or axillae bilaterally.  Gait normal with good balance and coordination.  MSK:  Non tender with full range of motion and good stability and symmetric strength and tone of shoulders, elbows, wrist, hip, knee and ankles bilaterally.  Back Exam:  Inspection: Mild loss of lordosis Motion: Flexion 45 deg, Extension 25 deg, Side Bending to 45 deg bilaterally,  Rotation to 45 deg bilaterally  SLR laying: Negative  XSLR laying: Negative  Palpable tenderness: Pain over the right sacroiliac joint in the right greater trochanteric area. FABER: Positive right. Sensory change: Gross sensation intact to all lumbar and sacral dermatomes.  Reflexes: 2+ at both patellar tendons, 2+ at achilles tendons, Babinski's downgoing.  Strength at foot  Plantar-flexion: 5/5 Dorsi-flexion: 5/5 Eversion: 5/5 Inversion: 5/5  Leg strength  Quad: 5/5 Hamstring: 5/5 Hip flexor: 5/5 Hip abductors: 5/5  Gait unremarkable.  Osteopathic findings C2 flexed rotated and side bent right C4 flexed rotated and side bent left C6 flexed rotated and side bent left T3 extended rotated and side bent right inhaled third rib L4 flexed rotated and side bent right Sacrum right on right      Impression and Recommendations:     This case required medical decision making of moderate complexity.      Note: This dictation was prepared with Dragon dictation along with smaller phrase technology. Any transcriptional errors that result from this process are unintentional.

## 2018-02-21 NOTE — Therapy (Signed)
[place], [place], [address] Phone: [phone]   Fax:  [phone]  Physical Therapy Treatment  Patient Details  Name: Christine [name] MRN: [id number] Date of Birth: 06/03/1951 Referring Provider: Lyndal Pulley, DO   Encounter Date: 02/21/2018  PT End of Session - 02/21/18 1624    Visit Number  5    Number of Visits  13    Date for PT Re-Evaluation  03/18/18    Authorization Type  MCARE     PT Start Time  1624    PT Stop Time  1712    PT Time Calculation (min)  48 min    Activity Tolerance  Patient tolerated treatment well    Behavior During Therapy  [facility] for tasks assessed/performed       Past Medical History:  Diagnosis Date  . Allergic rhinitis   . Diabetes mellitus   . History of recurrent UTIs    [name]  . HTN (hypertension)   . Hyperlipidemia   . Migraine headache    quiescent  . Sleep disorder    [name] May  . TIA (transient ischemic attack)     Past Surgical History:  Procedure Laterality Date  . COLONOSCOPY     Dr Carlean [name]; hemorrhoids  . CORONARY ANGIOPLASTY  1997   for chest pain- negative   . POLYPECTOMY  2002   benign, hyperplastic polyp; rectal bleeding 2008  . TONSILLECTOMY    . TOTAL ABDOMINAL HYSTERECTOMY  1983   BSO for endometriosis  . WISDOM TOOTH EXTRACTION      There were no vitals filed for this visit.  Subjective Assessment - 02/21/18 1624    Subjective  Right after I leave here it feels good. Horrible at night time. Noticed it does not hurt as bad when sleeping on the sofa but I dont sleep as well on the sofa so I am up and moving more. Tried stretches after waking with pain but still needed tramadol.     Patient Stated Goals  sleep, house work, shopping, stairs-leading with Rt leg                      [facility] Adult PT Treatment/Exercise - 02/21/18 0001      Lumbar Exercises: Standing   Other Standing Lumbar Exercises  trunk  rotation in gait      Lumbar Exercises: Prone   Other Prone Lumbar Exercises  prone on elbows 4 min; prone press ups    Other Prone Lumbar Exercises  prone elbow lifts      Manual Therapy   Soft tissue mobilization  Rt lumbar paraspinals at L3, lumbar PA mobs gr 3             PT Education - 02/21/18 1722    Education provided  Yes    Education Details  heel lift trial, extension bias, mattresses, gait pattern    Person(s) Educated  Patient    Methods  Explanation    Comprehension  Verbalized understanding;Other (comment)          PT Long Term Goals - 02/18/18 1053      PT LONG TERM GOAL #1   Title  Pt will be able to navigat stairs step over step with use of hand rail for safety    Baseline  unable to lead with Rt leg, has caused multiple falls    Time  6    Period  Weeks  Status  Unable to assess      PT LONG TERM GOAL #2   Title  Gross LE strength to 5/5 for proper support to lumbopelvic biomechanical chain    Baseline  NT at eval due to notable pelvic rotation    Time  6    Period  Weeks    Status  On-going      PT LONG TERM GOAL #3   Title  Pt will be able to run errands pain <=3/10    Baseline  severe and has to "drag myself" back to my car at the end of the day    Period  Weeks    Status  New      PT LONG TERM GOAL #4   Title  FOTO to 42% limited to indicate significant improvement in functional ability    Baseline  47% limited at eval    Time  6    Period  Weeks    Status  New            Plan - 02/21/18 1639    Clinical Impression Statement  Concordant pain in hip with PA to L3. 2 layer lift placed in left shoe, notable trendelenburg in Rt stance phase. no notable change from lift so it was removed. No change in pain in weighted extension, centralization noted in unweighted extension. Concordant pain referred to hip when plapating trigger point at Rt L3 paraspinal and reduced with manual treatment. Trained pt on trunk rotation in gait which  was difficult but had positive effects on hip. asked pt to perform prone on elbows, prone press ups and work on walking at home.     PT Treatment/Interventions  ADLs/Self Care Home Management;Cryotherapy;Electrical Stimulation;Ultrasound;Traction;Moist Heat;Iontophoresis 4mg /ml Dexamethasone;Gait training;Stair training;Therapeutic activities;Therapeutic exercise;Balance training;Patient/family education;Neuromuscular re-education;Manual techniques;Passive range of motion;Vasopneumatic Device;Taping;Dry needling    PT Next Visit Plan  DN to paraspinals?  re-evaluate extensions and walking    PT Home Exercise Plan  pelvic tilt, hooklying ball squeeze & clam, avoid crossing legs, piriformis stretch; prone on elbows, prone press ups, trunk rotation in gait    Consulted and Agree with Plan of Care  Patient       Patient will benefit from skilled therapeutic intervention in order to improve the following deficits and impairments:  Pain, Improper body mechanics, Increased muscle spasms, Decreased activity tolerance, Decreased strength, Difficulty walking  Visit Diagnosis: Chronic right-sided low back pain, with sciatica presence unspecified  Pain in right hip  Pain in left hip     Problem List Patient Active Problem List   Diagnosis Date Noted  . Subluxation of peroneal tendon of right foot 11/10/2017  . Gluteal tendonitis of right buttock 10/19/2017  . Lumbar radiculopathy, right 06/23/2017  . Closed displaced fracture of fifth metatarsal bone of left foot 03/16/2017  . GERD (gastroesophageal reflux disease) 12/25/2016  . Greater trochanteric bursitis of left hip 06/17/2016  . Diabetes ([place]) 04/02/2016  . Nonallopathic lesion of lumbosacral region 01/15/2016  . Greater trochanteric bursitis of right hip 11/27/2015  . Routine general medical examination at a health care facility 11/25/2015  . Nonallopathic lesion of thoracic region 09/26/2014  . Nonallopathic lesion-rib cage 09/26/2014   . Nonallopathic lesion of cervical region 07/19/2014  . Hx of osteopenia 10/11/2012  . Seasonal allergic rhinitis 03/21/2012  . Fibromyalgia 08/12/2011  . Palpitations 04/15/2011  . DEPRESSIVE DISORDER NOT ELSEWHERE CLASSIFIED 03/27/2010  . Vitamin D deficiency 05/25/2008  . Lumbar radiculopathy 04/11/2008  . Essential hypertension 03/14/2008  .  TRANSIENT ISCHEMIC ATTACKS, HX OF 02/02/2008  . Hyperlipidemia 01/18/2007   Arnetha Silverthorne C. Waymon Laser PT, DPT 02/21/18 5:25 PM   [facility] [address] [place], [place], [address] Phone: [phone]   Fax:  [phone]  Name: Christine [name] MRN: [id number] Date of Birth: Feb 11, 1951

## 2018-02-22 ENCOUNTER — Encounter: Payer: Self-pay | Admitting: Family Medicine

## 2018-02-22 ENCOUNTER — Ambulatory Visit (INDEPENDENT_AMBULATORY_CARE_PROVIDER_SITE_OTHER): Payer: Medicare Other | Admitting: Family Medicine

## 2018-02-22 DIAGNOSIS — M999 Biomechanical lesion, unspecified: Secondary | ICD-10-CM

## 2018-02-22 DIAGNOSIS — M5416 Radiculopathy, lumbar region: Secondary | ICD-10-CM

## 2018-02-22 MED ORDER — TRAZODONE HCL 50 MG PO TABS: 25.0000 mg | ORAL_TABLET | Freq: Every evening | ORAL | 3 refills | Status: DC | PRN

## 2018-02-22 NOTE — Assessment & Plan Note (Signed)
No longer having radicular symptoms.  Has been responding fairly well to physical therapy.  Continues to respond well to osteopathic manipulation.  We discussed icing regimen and home exercises, we discussed which activities to do which wants to avoid.  Increase activity slowly over the course the next several days.  Follow-up with me again in 4-6 weeks.

## 2018-02-22 NOTE — Patient Instructions (Signed)
Good to see you  I am so impressed! Keep it up  Stay active.  Trazadone at night if needed See me again in 4 weeks!

## 2018-02-23 ENCOUNTER — Encounter: Payer: Self-pay | Admitting: Physical Therapy

## 2018-02-23 ENCOUNTER — Ambulatory Visit: Payer: Medicare Other | Admitting: Physical Therapy

## 2018-02-23 DIAGNOSIS — G8929 Other chronic pain: Secondary | ICD-10-CM

## 2018-02-23 DIAGNOSIS — M545 Low back pain: Principal | ICD-10-CM

## 2018-02-23 DIAGNOSIS — M25552 Pain in left hip: Secondary | ICD-10-CM

## 2018-02-23 DIAGNOSIS — M25551 Pain in right hip: Secondary | ICD-10-CM | POA: Diagnosis not present

## 2018-02-23 NOTE — Therapy (Signed)
[place], [place], [address] Phone: [phone]   Fax:  [phone]  Physical Therapy Treatment  Patient Details  Name: Christine [name] MRN: [id number] Date of Birth: 06-Jul-1951 Referring Provider: Lyndal Pulley, DO   Encounter Date: 02/23/2018  PT End of Session - 02/23/18 1103    Visit Number  6    Number of Visits  13    Date for PT Re-Evaluation  03/18/18    Authorization Type  MCARE     PT Start Time  1103    PT Stop Time  1140    PT Time Calculation (min)  37 min    Activity Tolerance  Patient tolerated treatment well    Behavior During Therapy  [facility] for tasks assessed/performed       Past Medical History:  Diagnosis Date  . Allergic rhinitis   . Diabetes mellitus   . History of recurrent UTIs    [name]  . HTN (hypertension)   . Hyperlipidemia   . Migraine headache    quiescent  . Sleep disorder    [name] May  . TIA (transient ischemic attack)     Past Surgical History:  Procedure Laterality Date  . COLONOSCOPY     Dr Carlean [name]; hemorrhoids  . CORONARY ANGIOPLASTY  1997   for chest pain- negative   . POLYPECTOMY  2002   benign, hyperplastic polyp; rectal bleeding 2008  . TONSILLECTOMY    . TOTAL ABDOMINAL HYSTERECTOMY  1983   BSO for endometriosis  . WISDOM TOOTH EXTRACTION      There were no vitals filed for this visit.  Subjective Assessment - 02/23/18 1103    Subjective  Went to about 6 different places yesterday and had to walk to the back of each one, by the time we got to [place] I was unable to walk out to car at the end. Still feels like extensions are helpful. Heating pad was helpful.     Patient Stated Goals  sleep, house work, shopping, stairs-leading with Rt leg    Currently in Pain?  Yes    Pain Score  4     Pain Location  Back    Pain Orientation  Right                      [facility] Adult PT Treatment/Exercise - 02/23/18 0001      Lumbar  Exercises: Stretches   Other Lumbar Stretch Exercise  LT sidelying over pillows, also in door      Lumbar Exercises: Machines for Strengthening   Other Lumbar Machine Exercise  UBE retro 3 min      Lumbar Exercises: Standing   Row  20 reps;10 reps blue tband    Other Standing Lumbar Exercises  GHJ flx with abd pull on band      Lumbar Exercises: Quadruped   Other Quadruped Lumbar Exercises  alt UE flexion      Manual Therapy   Soft tissue mobilization  IASTM Rt lumbar parspinals & QL                  PT Long Term Goals - 02/18/18 1053      PT LONG TERM GOAL #1   Title  Pt will be able to navigat stairs step over step with use of hand rail for safety    Baseline  unable to lead with Rt leg, has caused multiple falls    Time  6    Period  Weeks    Status  Unable to assess      PT LONG TERM GOAL #2   Title  Gross LE strength to 5/5 for proper support to lumbopelvic biomechanical chain    Baseline  NT at eval due to notable pelvic rotation    Time  6    Period  Weeks    Status  On-going      PT LONG TERM GOAL #3   Title  Pt will be able to run errands pain <=3/10    Baseline  severe and has to "drag myself" back to my car at the end of the day    Period  Weeks    Status  New      PT LONG TERM GOAL #4   Title  FOTO to 42% limited to indicate significant improvement in functional ability    Baseline  47% limited at eval    Time  6    Period  Weeks    Status  New            Plan - 02/23/18 1135    Clinical Impression Statement  Exercises to encourage upright posture and challenge endurance to reduce flexion with fatigue in upright position. Reported feeling better with QL stretch.     PT Treatment/Interventions  ADLs/Self Care Home Management;Cryotherapy;Electrical Stimulation;Ultrasound;Traction;Moist Heat;Iontophoresis 4mg /ml Dexamethasone;Gait training;Stair training;Therapeutic activities;Therapeutic exercise;Balance training;Patient/family  education;Neuromuscular re-education;Manual techniques;Passive range of motion;Vasopneumatic Device;Taping;Dry needling    PT Next Visit Plan  cont manual PRN- discuss laying on tennis ball if needed, extension strength/endurance    PT Home Exercise Plan  pelvic tilt, hooklying ball squeeze & clam, avoid crossing legs, piriformis stretch; prone on elbows, prone press ups, trunk rotation in gait; sidelying & door QL stretch, qped alt UE flx, row    Consulted and Agree with Plan of Care  Patient       Patient will benefit from skilled therapeutic intervention in order to improve the following deficits and impairments:  Pain, Improper body mechanics, Increased muscle spasms, Decreased activity tolerance, Decreased strength, Difficulty walking  Visit Diagnosis: Chronic right-sided low back pain, with sciatica presence unspecified  Pain in right hip  Pain in left hip     Problem List Patient Active Problem List   Diagnosis Date Noted  . Subluxation of peroneal tendon of right foot 11/10/2017  . Gluteal tendonitis of right buttock 10/19/2017  . Lumbar radiculopathy, right 06/23/2017  . Closed displaced fracture of fifth metatarsal bone of left foot 03/16/2017  . GERD (gastroesophageal reflux disease) 12/25/2016  . Greater trochanteric bursitis of left hip 06/17/2016  . Diabetes ([place]) 04/02/2016  . Nonallopathic lesion of lumbosacral region 01/15/2016  . Greater trochanteric bursitis of right hip 11/27/2015  . Routine general medical examination at a health care facility 11/25/2015  . Nonallopathic lesion of thoracic region 09/26/2014  . Nonallopathic lesion-rib cage 09/26/2014  . Nonallopathic lesion of cervical region 07/19/2014  . Hx of osteopenia 10/11/2012  . Seasonal allergic rhinitis 03/21/2012  . Fibromyalgia 08/12/2011  . Palpitations 04/15/2011  . DEPRESSIVE DISORDER NOT ELSEWHERE CLASSIFIED 03/27/2010  . Vitamin D deficiency 05/25/2008  . Lumbar radiculopathy 04/11/2008   . Essential hypertension 03/14/2008  . TRANSIENT ISCHEMIC ATTACKS, HX OF 02/02/2008  . Hyperlipidemia 01/18/2007   Ellanore Vanhook C. Donnie Panik PT, DPT 02/23/18 11:48 AM   [facility] [address] [place], [place], [address] Phone: [phone]   Fax:  [phone]  Name: Christine  BRYLI [name] MRN: [id number] Date of Birth: Feb 13, 1951

## 2018-03-01 ENCOUNTER — Encounter: Payer: Self-pay | Admitting: Physical Therapy

## 2018-03-01 ENCOUNTER — Ambulatory Visit: Payer: Medicare Other | Admitting: Physical Therapy

## 2018-03-01 DIAGNOSIS — M25551 Pain in right hip: Secondary | ICD-10-CM | POA: Diagnosis not present

## 2018-03-01 DIAGNOSIS — G8929 Other chronic pain: Secondary | ICD-10-CM

## 2018-03-01 DIAGNOSIS — M25552 Pain in left hip: Secondary | ICD-10-CM

## 2018-03-01 DIAGNOSIS — M545 Low back pain: Principal | ICD-10-CM

## 2018-03-01 NOTE — Therapy (Signed)
[place], [place], [address] Phone: [phone]   Fax:  [phone]  Physical Therapy Treatment  Patient Details  Name: Christine [name] MRN: [id number] Date of Birth: [birth year] Referring Provider: Lyndal Pulley, DO   Encounter Date: 03/01/2018  PT End of Session - 03/01/18 1415    Visit Number  7    Number of Visits  13    Date for PT Re-Evaluation  03/18/18    Authorization Type  MCARE     PT Start Time  1416    PT Stop Time  1508    PT Time Calculation (min)  52 min    Activity Tolerance  Patient tolerated treatment well    Behavior During Therapy  [facility] for tasks assessed/performed       Past Medical History:  Diagnosis Date  . Allergic rhinitis   . Diabetes mellitus   . History of recurrent UTIs    [name]  . HTN (hypertension)   . Hyperlipidemia   . Migraine headache    quiescent  . Sleep disorder    [name] May  . TIA (transient ischemic attack)     Past Surgical History:  Procedure Laterality Date  . COLONOSCOPY     Dr Carlean [name]; hemorrhoids  . CORONARY ANGIOPLASTY  1997   for chest pain- negative   . POLYPECTOMY  2002   benign, hyperplastic polyp; rectal bleeding 2008  . TONSILLECTOMY    . TOTAL ABDOMINAL HYSTERECTOMY  1983   BSO for endometriosis  . WISDOM TOOTH EXTRACTION      There were no vitals filed for this visit.  Subjective Assessment - 03/01/18 1416    Subjective  I have had a couple of rough days. Wed went home and did a lot of house work in [employer] for guests. it took 3 days to get over it. After right hip/leg calmed down, began feeling pain in Lt SIJ. I feel it more with steps today.     Patient Stated Goals  sleep, house work, shopping, stairs-leading with Rt leg    Currently in Pain?  Yes    Pain Score  3     Pain Location  Hip    Pain Orientation  Right    Pain Descriptors / Indicators  Sore                      [facility] Adult PT  Treatment/Exercise - 03/01/18 0001      Lumbar Exercises: Prone   Other Prone Lumbar Exercises  prone on elbows, prone press ups      Knee/Hip Exercises: Stretches   Passive Hamstring Stretch  Other (comment) midline & cross midline    Piriformis Stretch  Both;30 seconds    Gastroc Stretch  Both;2 reps;30 seconds      Knee/Hip Exercises: Aerobic   Nustep  5 min L4 LE only      Moist Heat Therapy   Number Minutes Moist Heat  10 Minutes    Moist Heat Location  Lumbar Spine in prone      Manual Therapy   Manual therapy comments  attempted use of SIJ belt    Soft tissue mobilization  IASTM bil piriformis, ITB, lateral hamstrings             PT Education - 03/01/18 1511    Education provided  Yes    Education Details  discussion of pain since last visit, taking rest breaks &  utilizing extensions/stretches, SIJ belt    Person(s) Educated  Patient    Methods  Explanation;Verbal cues    Comprehension  Verbal cues required;Verbalized understanding;Need further instruction          PT Long Term Goals - 02/18/18 1053      PT LONG TERM GOAL #1   Title  Pt will be able to navigat stairs step over step with use of hand rail for safety    Baseline  unable to lead with Rt leg, has caused multiple falls    Time  6    Period  Weeks    Status  Unable to assess      PT LONG TERM GOAL #2   Title  Gross LE strength to 5/5 for proper support to lumbopelvic biomechanical chain    Baseline  NT at eval due to notable pelvic rotation    Time  6    Period  Weeks    Status  On-going      PT LONG TERM GOAL #3   Title  Pt will be able to run errands pain <=3/10    Baseline  severe and has to "drag myself" back to my car at the end of the day    Period  Weeks    Status  New      PT LONG TERM GOAL #4   Title  FOTO to 42% limited to indicate significant improvement in functional ability    Baseline  47% limited at eval    Time  6    Period  Weeks    Status  New             Plan - 03/01/18 1502    Clinical Impression Statement  Was able to decrease concordant pain in bilateral hips today using IASTM. Felt more stable with SIJ belt but did not like the pressure placed on greater throchanter. Discussed importance of scheduling rest breaks into busy days or long to do lists at home to avoid reaching severe levels of pain. Brusing noted along illiac crest on right side.     PT Treatment/Interventions  ADLs/Self Care Home Management;Cryotherapy;Electrical Stimulation;Ultrasound;Traction;Moist Heat;Iontophoresis 4mg /ml Dexamethasone;Gait training;Stair training;Therapeutic activities;Therapeutic exercise;Balance training;Patient/family education;Neuromuscular re-education;Manual techniques;Passive range of motion;Vasopneumatic Device;Taping;Dry needling    PT Next Visit Plan  cont manual PRN- discuss laying on tennis ball if needed, extension strength/endurance    PT Home Exercise Plan  pelvic tilt, hooklying ball squeeze & clam, avoid crossing legs, piriformis stretch; prone on elbows, prone press ups, trunk rotation in gait; sidelying & door QL stretch, qped alt UE flx, row    Consulted and Agree with Plan of Care  Patient       Patient will benefit from skilled therapeutic intervention in order to improve the following deficits and impairments:  Pain, Improper body mechanics, Increased muscle spasms, Decreased activity tolerance, Decreased strength, Difficulty walking  Visit Diagnosis: Chronic right-sided low back pain, with sciatica presence unspecified  Pain in right hip  Pain in left hip     Problem List Patient Active Problem List   Diagnosis Date Noted  . Subluxation of peroneal tendon of right foot 11/10/2017  . Gluteal tendonitis of right buttock 10/19/2017  . Lumbar radiculopathy, right 06/23/2017  . Closed displaced fracture of fifth metatarsal bone of left foot 03/16/2017  . GERD (gastroesophageal reflux disease) 12/25/2016  . Greater  trochanteric bursitis of left hip 06/17/2016  . Diabetes ([place]) 04/02/2016  . Nonallopathic lesion of lumbosacral region 01/15/2016  .  Greater trochanteric bursitis of right hip 11/27/2015  . Routine general medical examination at a health care facility 11/25/2015  . Nonallopathic lesion of thoracic region 09/26/2014  . Nonallopathic lesion-rib cage 09/26/2014  . Nonallopathic lesion of cervical region 07/19/2014  . Hx of osteopenia 10/11/2012  . Seasonal allergic rhinitis 03/21/2012  . Fibromyalgia 08/12/2011  . Palpitations 04/15/2011  . DEPRESSIVE DISORDER NOT ELSEWHERE CLASSIFIED 03/27/2010  . Vitamin D deficiency 05/25/2008  . Lumbar radiculopathy 04/11/2008  . Essential hypertension 03/14/2008  . TRANSIENT ISCHEMIC ATTACKS, HX OF 02/02/2008  . Hyperlipidemia 01/18/2007    Levone Otten C. Shivon Hackel PT, DPT 03/01/18 3:13 PM   [facility] [address] [place], [place], [address] Phone: [phone]   Fax:  [phone]  Name: FARRYN LINARES MRN: [id number] Date of Birth: 1951-01-05

## 2018-03-07 ENCOUNTER — Encounter: Payer: Self-pay | Admitting: Internal Medicine

## 2018-03-07 ENCOUNTER — Ambulatory Visit: Payer: Medicare Other | Admitting: Physical Therapy

## 2018-03-07 ENCOUNTER — Ambulatory Visit (INDEPENDENT_AMBULATORY_CARE_PROVIDER_SITE_OTHER): Payer: Medicare Other | Admitting: Internal Medicine

## 2018-03-07 ENCOUNTER — Other Ambulatory Visit: Payer: Medicare Other

## 2018-03-07 ENCOUNTER — Encounter: Payer: Self-pay | Admitting: Physical Therapy

## 2018-03-07 VITALS — BP 122/70 | HR 90 | Temp 97.6°F | Ht 66.0 in | Wt 193.0 lb

## 2018-03-07 DIAGNOSIS — R300 Dysuria: Secondary | ICD-10-CM

## 2018-03-07 DIAGNOSIS — G8929 Other chronic pain: Secondary | ICD-10-CM | POA: Diagnosis not present

## 2018-03-07 DIAGNOSIS — M25552 Pain in left hip: Secondary | ICD-10-CM

## 2018-03-07 DIAGNOSIS — M25551 Pain in right hip: Secondary | ICD-10-CM | POA: Diagnosis not present

## 2018-03-07 DIAGNOSIS — M545 Low back pain: Principal | ICD-10-CM

## 2018-03-07 LAB — POCT URINALYSIS DIPSTICK
BILIRUBIN UA: NEGATIVE
GLUCOSE UA: NEGATIVE
KETONES UA: NEGATIVE
Leukocytes, UA: NEGATIVE
NITRITE UA: NEGATIVE
PROTEIN UA: NEGATIVE
SPEC GRAV UA: 1.025 (ref 1.010–1.025)
Urobilinogen, UA: 0.2 E.U./dL
pH, UA: 6 (ref 5.0–8.0)

## 2018-03-07 MED ORDER — CIPROFLOXACIN HCL 500 MG PO TABS: 500.0000 mg | ORAL_TABLET | Freq: Two times a day (BID) | ORAL | 0 refills | Status: DC

## 2018-03-07 NOTE — Therapy (Signed)
Christine [name], [place], [address] Phone: [phone]   Fax:  [phone]  Physical Therapy Treatment  Patient Details  Name: Christine [name] MRN: [id number] Date of Birth: 07/12/1951 Referring Provider: Lyndal Pulley, DO   Encounter Date: 03/07/2018  PT End of Session - 03/07/18 1021    Visit Number  8    Number of Visits  13    Date for PT Re-Evaluation  03/18/18    Authorization Type  MCARE     PT Start Time  1019    PT Stop Time  1057    PT Time Calculation (min)  38 min    Activity Tolerance  Patient tolerated treatment well    Behavior During Therapy  [facility] for tasks assessed/performed       Past Medical History:  Diagnosis Date  . Allergic rhinitis   . Diabetes mellitus   . History of recurrent UTIs    [name]  . HTN (hypertension)   . Hyperlipidemia   . Migraine headache    quiescent  . Sleep disorder    [name] May  . TIA (transient ischemic attack)     Past Surgical History:  Procedure Laterality Date  . COLONOSCOPY     Dr Carlean [name]; hemorrhoids  . CORONARY ANGIOPLASTY  1997   for chest pain- negative   . POLYPECTOMY  2002   benign, hyperplastic polyp; rectal bleeding 2008  . TONSILLECTOMY    . TOTAL ABDOMINAL HYSTERECTOMY  1983   BSO for endometriosis  . WISDOM TOOTH EXTRACTION      There were no vitals filed for this visit.  Subjective Assessment - 03/07/18 1021    Subjective  I was on my feet about 5 hours on Friday so I had a rough day. I feel better today. I think we got rid of the bursitis.     Patient Stated Goals  sleep, house work, shopping, stairs-leading with Rt leg    Currently in Pain?  Yes    Pain Score  2     Pain Location  Back    Pain Orientation  Right;Lower                No data recorded       [facility] Adult PT Treatment/Exercise - 03/07/18 0001      Lumbar Exercises: Aerobic   Nustep  5 min L4 LE only      Lumbar Exercises: Supine    Other Supine Lumbar Exercises  stabilizer for abdominal engagement; +UE flexion; +marching      Manual Therapy   Manual therapy comments  edu on use of tennis ball for trigger point release             PT Education - 03/07/18 1109    Education provided  Yes    Education Details  use of tennis ball & precautions, exercise form/rationale    Person(s) Educated  Patient    Methods  Explanation;Verbal cues    Comprehension  Verbalized understanding;Verbal cues required;Need further instruction          PT Long Term Goals - 02/18/18 1053      PT LONG TERM GOAL #1   Title  Pt will be able to navigat stairs step over step with use of hand rail for safety    Baseline  unable to lead with Rt leg, has caused multiple falls    Time  6    Period  Weeks  Status  Unable to assess      PT LONG TERM GOAL #2   Title  Gross LE strength to 5/5 for proper support to lumbopelvic biomechanical chain    Baseline  NT at eval due to notable pelvic rotation    Time  6    Period  Weeks    Status  On-going      PT LONG TERM GOAL #3   Title  Pt will be able to run errands pain <=3/10    Baseline  severe and has to "drag myself" back to my car at the end of the day    Period  Weeks    Status  New      PT LONG TERM GOAL #4   Title  FOTO to 42% limited to indicate significant improvement in functional ability    Baseline  47% limited at eval    Time  6    Period  Weeks    Status  New            Plan - 03/07/18 1106    Clinical Impression Statement  Utilized stabilizer today for abdominal training without increasing flexion through lumbar spine. With practice, pt was able to perform UE flexion as well as marching with stability in pressure.     PT Treatment/Interventions  ADLs/Self Care Home Management;Cryotherapy;Electrical Stimulation;Ultrasound;Traction;Moist Heat;Iontophoresis 4mg /ml Dexamethasone;Gait training;Stair training;Therapeutic activities;Therapeutic exercise;Balance  training;Patient/family education;Neuromuscular re-education;Manual techniques;Passive range of motion;Vasopneumatic Device;Taping;Dry needling    PT Next Visit Plan  manual PRN. review HEP & progress PRN    PT Home Exercise Plan  pelvic tilt, hooklying ball squeeze & clam, avoid crossing legs, piriformis stretch; prone on elbows, prone press ups, trunk rotation in gait; sidelying & door QL stretch, qped alt UE flx, row    Consulted and Agree with Plan of Care  Patient       Patient will benefit from skilled therapeutic intervention in order to improve the following deficits and impairments:  Pain, Improper body mechanics, Increased muscle spasms, Decreased activity tolerance, Decreased strength, Difficulty walking  Visit Diagnosis: Chronic right-sided low back pain, with sciatica presence unspecified  Pain in right hip  Pain in left hip     Problem List Patient Active Problem List   Diagnosis Date Noted  . Subluxation of peroneal tendon of right foot 11/10/2017  . Gluteal tendonitis of right buttock 10/19/2017  . Lumbar radiculopathy, right 06/23/2017  . Closed displaced fracture of fifth metatarsal bone of left foot 03/16/2017  . GERD (gastroesophageal reflux disease) 12/25/2016  . Greater trochanteric bursitis of left hip 06/17/2016  . Diabetes ([place]) 04/02/2016  . Nonallopathic lesion of lumbosacral region 01/15/2016  . Greater trochanteric bursitis of right hip 11/27/2015  . Routine general medical examination at a health care facility 11/25/2015  . Nonallopathic lesion of thoracic region 09/26/2014  . Nonallopathic lesion-rib cage 09/26/2014  . Nonallopathic lesion of cervical region 07/19/2014  . Hx of osteopenia 10/11/2012  . Seasonal allergic rhinitis 03/21/2012  . Fibromyalgia 08/12/2011  . Palpitations 04/15/2011  . DEPRESSIVE DISORDER NOT ELSEWHERE CLASSIFIED 03/27/2010  . Vitamin D deficiency 05/25/2008  . Lumbar radiculopathy 04/11/2008  . Essential hypertension  03/14/2008  . TRANSIENT ISCHEMIC ATTACKS, HX OF 02/02/2008  . Hyperlipidemia 01/18/2007    Aidan Caloca C. Ellouise Mcwhirter PT, DPT 03/07/18 11:10 AM   [facility] [address] [place], [place], [address] Phone: [phone]   Fax:  [phone]  Name: Christine [name] MRN: [id number] Date of Birth:  12/06/1951   

## 2018-03-07 NOTE — Progress Notes (Signed)
   Subjective:    Patient ID: Christine [name], female    DOB: 1950-12-27, 67 y.o.   MRN: [id number]  HPI The patient is a 67 YO female coming in for possible UTI. She is having cloudy urine, odor, some burning with urination. She has had 4 recently and underwent 3-4 courses of antibiotics. She has taken bactrim twice, augmentin and macrobid at different times. Denies fevers or chills. Some mild nausea.   Review of Systems  Constitutional: Negative.   Respiratory: Negative for cough, chest tightness and shortness of breath.   Cardiovascular: Negative for chest pain, palpitations and leg swelling.  Gastrointestinal: Negative for abdominal distention, abdominal pain, constipation, diarrhea, nausea and vomiting.  Genitourinary: Positive for dysuria and frequency. Negative for decreased urine volume, difficulty urinating, dyspareunia, flank pain, hematuria and urgency.  Musculoskeletal: Negative.   Skin: Negative.   Neurological: Negative.   Psychiatric/Behavioral: Negative.       Objective:   Physical Exam  Constitutional: She is oriented to person, place, and time. She appears well-developed and well-nourished.  HENT:  Head: Normocephalic and atraumatic.  Eyes: EOM are normal.  Neck: Normal range of motion.  Cardiovascular: Normal rate and regular rhythm.  Pulmonary/Chest: Effort normal and breath sounds normal. No respiratory distress. She has no wheezes. She has no rales.  Abdominal: Soft. Bowel sounds are normal. She exhibits no distension. There is no tenderness. There is no rebound.  Musculoskeletal: She exhibits no edema.  Neurological: She is alert and oriented to person, place, and time. Coordination normal.  Skin: Skin is warm and dry.   Vitals:   03/07/18 1558  BP: 122/70  Pulse: 90  Temp: 97.6 F (36.4 C)  TempSrc: Oral  SpO2: 97%  Weight: 193 lb (87.5 kg)  Height: 5\' 6"  (1.676 m)      Assessment & Plan:

## 2018-03-07 NOTE — Patient Instructions (Signed)
We have sent in the ciprofloxacin to take it 1 pill twice a day for 5 days.

## 2018-03-08 ENCOUNTER — Encounter: Payer: Self-pay | Admitting: Internal Medicine

## 2018-03-08 DIAGNOSIS — R300 Dysuria: Secondary | ICD-10-CM | POA: Insufficient documentation

## 2018-03-08 DIAGNOSIS — R3989 Other symptoms and signs involving the genitourinary system: Secondary | ICD-10-CM | POA: Insufficient documentation

## 2018-03-08 NOTE — Assessment & Plan Note (Signed)
U/A with some signs of infection and will send for culture. Treat with cipro and adjust if needed based on culture. If she has recurrence we talked about vaginal estrogen cream to help prevent recurrence.

## 2018-03-09 ENCOUNTER — Ambulatory Visit: Payer: Medicare Other | Admitting: Physical Therapy

## 2018-03-09 ENCOUNTER — Encounter: Payer: Self-pay | Admitting: Physical Therapy

## 2018-03-09 DIAGNOSIS — G8929 Other chronic pain: Secondary | ICD-10-CM

## 2018-03-09 DIAGNOSIS — M25551 Pain in right hip: Secondary | ICD-10-CM | POA: Diagnosis not present

## 2018-03-09 DIAGNOSIS — M25552 Pain in left hip: Secondary | ICD-10-CM | POA: Diagnosis not present

## 2018-03-09 DIAGNOSIS — M545 Low back pain: Secondary | ICD-10-CM | POA: Diagnosis not present

## 2018-03-09 LAB — URINE CULTURE
MICRO NUMBER:: 90369990
SPECIMEN QUALITY:: ADEQUATE

## 2018-03-09 NOTE — Therapy (Signed)
[place], [place], [address] Phone: [phone]   Fax:  [phone]  Physical Therapy Treatment  Patient Details  Name: Christine [name] MRN: [id number] Date of Birth: 05-May-1951 Referring Provider: Lyndal Pulley, DO   Encounter Date: 03/09/2018  PT End of Session - 03/09/18 1147    Visit Number  9    Number of Visits  13    Date for PT Re-Evaluation  03/18/18    Authorization Type  MCARE     PT Start Time  1147    PT Stop Time  1225    PT Time Calculation (min)  38 min    Activity Tolerance  Patient tolerated treatment well    Behavior During Therapy  [facility] for tasks assessed/performed       Past Medical History:  Diagnosis Date  . Allergic rhinitis   . Diabetes mellitus   . History of recurrent UTIs    [name]  . HTN (hypertension)   . Hyperlipidemia   . Migraine headache    quiescent  . Sleep disorder    [name] May  . TIA (transient ischemic attack)     Past Surgical History:  Procedure Laterality Date  . COLONOSCOPY     Dr Carlean [name]; hemorrhoids  . CORONARY ANGIOPLASTY  1997   for chest pain- negative   . POLYPECTOMY  2002   benign, hyperplastic polyp; rectal bleeding 2008  . TONSILLECTOMY    . TOTAL ABDOMINAL HYSTERECTOMY  1983   BSO for endometriosis  . WISDOM TOOTH EXTRACTION      There were no vitals filed for this visit.  Subjective Assessment - 03/09/18 1148    Subjective  I am having a rough day. My blood sugar is way up and didn't sleep well. palpable knot in Piriformis. Pt was on hands/knees with toothpicks cleaning floors and did not have pain when she got up, felt it the next morning- did not stretch before going to bed.     Patient Stated Goals  sleep, house work, shopping, stairs-leading with Rt leg                No data recorded       [facility] Adult PT Treatment/Exercise - 03/09/18 0001      Knee/Hip Exercises: Stretches   Piriformis Stretch   Both;30 seconds      Knee/Hip Exercises: Aerobic   Nustep  5 min L5 LE only      Manual Therapy   Soft tissue mobilization  STW to Rt piriformis                  PT Long Term Goals - 02/18/18 1053      PT LONG TERM GOAL #1   Title  Pt will be able to navigat stairs step over step with use of hand rail for safety    Baseline  unable to lead with Rt leg, has caused multiple falls    Time  6    Period  Weeks    Status  Unable to assess      PT LONG TERM GOAL #2   Title  Gross LE strength to 5/5 for proper support to lumbopelvic biomechanical chain    Baseline  NT at eval due to notable pelvic rotation    Time  6    Period  Weeks    Status  On-going      PT LONG TERM GOAL #3   Title  Pt will be able to run errands pain <=3/10    Baseline  severe and has to "drag myself" back to my car at the end of the day    Period  Weeks    Status  New      PT LONG TERM GOAL #4   Title  FOTO to 42% limited to indicate significant improvement in functional ability    Baseline  47% limited at eval    Time  6    Period  Weeks    Status  New            Plan - 03/09/18 1236    Clinical Impression Statement  knot released with manual therapy today and pt reported feeling better after treatment. Progressed to sidelying clams with band today for increased glut med strengthening. encouraged daily stretching to avoid forming knots again.     PT Treatment/Interventions  ADLs/Self Care Home Management;Cryotherapy;Electrical Stimulation;Ultrasound;Traction;Moist Heat;Iontophoresis 4mg /ml Dexamethasone;Gait training;Stair training;Therapeutic activities;Therapeutic exercise;Balance training;Patient/family education;Neuromuscular re-education;Manual techniques;Passive range of motion;Vasopneumatic Device;Taping;Dry needling    PT Home Exercise Plan  pelvic tilt, sidelying clam, avoid crossing legs, piriformis stretch; prone on elbows, prone press ups, trunk rotation in gait; sidelying &  door QL stretch, qped alt UE flx, row    Consulted and Agree with Plan of Care  Patient       Patient will benefit from skilled therapeutic intervention in order to improve the following deficits and impairments:  Pain, Improper body mechanics, Increased muscle spasms, Decreased activity tolerance, Decreased strength, Difficulty walking  Visit Diagnosis: Chronic right-sided low back pain, with sciatica presence unspecified  Pain in right hip  Pain in left hip     Problem List Patient Active Problem List   Diagnosis Date Noted  . Dysuria 03/08/2018  . Subluxation of peroneal tendon of right foot 11/10/2017  . Gluteal tendonitis of right buttock 10/19/2017  . Lumbar radiculopathy, right 06/23/2017  . Closed displaced fracture of fifth metatarsal bone of left foot 03/16/2017  . GERD (gastroesophageal reflux disease) 12/25/2016  . Greater trochanteric bursitis of left hip 06/17/2016  . Diabetes ([place]) 04/02/2016  . Nonallopathic lesion of lumbosacral region 01/15/2016  . Greater trochanteric bursitis of right hip 11/27/2015  . Routine general medical examination at a health care facility 11/25/2015  . Nonallopathic lesion of thoracic region 09/26/2014  . Nonallopathic lesion-rib cage 09/26/2014  . Nonallopathic lesion of cervical region 07/19/2014  . Hx of osteopenia 10/11/2012  . Seasonal allergic rhinitis 03/21/2012  . Fibromyalgia 08/12/2011  . Palpitations 04/15/2011  . DEPRESSIVE DISORDER NOT ELSEWHERE CLASSIFIED 03/27/2010  . Vitamin D deficiency 05/25/2008  . Lumbar radiculopathy 04/11/2008  . Essential hypertension 03/14/2008  . TRANSIENT ISCHEMIC ATTACKS, HX OF 02/02/2008  . Hyperlipidemia 01/18/2007    Affan Callow C. Ainslie Mazurek PT, DPT 03/09/18 12:41 PM   [facility] [address] [place], [place], [address] Phone: [phone]   Fax:  [phone]  Name: Christine [name] MRN: [id number] Date of Birth:  Mar 08, 1951

## 2018-03-12 ENCOUNTER — Other Ambulatory Visit: Payer: Self-pay | Admitting: Family Medicine

## 2018-03-14 ENCOUNTER — Encounter: Payer: Self-pay | Admitting: Physical Therapy

## 2018-03-14 ENCOUNTER — Ambulatory Visit: Payer: Medicare Other | Attending: Family Medicine | Admitting: Physical Therapy

## 2018-03-14 DIAGNOSIS — M25551 Pain in right hip: Secondary | ICD-10-CM | POA: Insufficient documentation

## 2018-03-14 DIAGNOSIS — G8929 Other chronic pain: Secondary | ICD-10-CM | POA: Insufficient documentation

## 2018-03-14 DIAGNOSIS — M545 Low back pain: Secondary | ICD-10-CM | POA: Diagnosis not present

## 2018-03-14 DIAGNOSIS — M25552 Pain in left hip: Secondary | ICD-10-CM | POA: Diagnosis not present

## 2018-03-14 NOTE — Therapy (Signed)
[place], [place], [address] Phone: [phone]   Fax:  [phone]  Physical Therapy Treatment  Patient Details  Name: Christine [name] MRN: [id number] Date of Birth: 1951/01/19 Referring Provider: Lyndal Pulley, DO   Encounter Date: 03/14/2018  PT End of Session - 03/14/18 1016    Visit Number  10    Number of Visits  13    Date for PT Re-Evaluation  03/18/18    Authorization Type  MCARE     PT Start Time  1015    PT Stop Time  1055    PT Time Calculation (min)  40 min    Activity Tolerance  Patient tolerated treatment well    Behavior During Therapy  [facility] for tasks assessed/performed       Past Medical History:  Diagnosis Date  . Allergic rhinitis   . Diabetes mellitus   . History of recurrent UTIs    [name]  . HTN (hypertension)   . Hyperlipidemia   . Migraine headache    quiescent  . Sleep disorder    [name] May  . TIA (transient ischemic attack)     Past Surgical History:  Procedure Laterality Date  . COLONOSCOPY     Dr Carlean [name]; hemorrhoids  . CORONARY ANGIOPLASTY  1997   for chest pain- negative   . POLYPECTOMY  2002   benign, hyperplastic polyp; rectal bleeding 2008  . TONSILLECTOMY    . TOTAL ABDOMINAL HYSTERECTOMY  1983   BSO for endometriosis  . WISDOM TOOTH EXTRACTION      There were no vitals filed for this visit.  Subjective Assessment - 03/14/18 1016    Subjective  Saturday night I woke up with 10/10 pain running from lateral hip to medial calf along lateral aspect. After moving around for about 10 min it went away. Clam shells fatigued hips significantly, esp on Lt. Rode for about an hour to a reunion.     Patient Stated Goals  sleep, house work, shopping, stairs-leading with Rt leg    Currently in Pain?  Yes    Pain Score  4     Pain Location  Hip    Pain Orientation  Right;Lateral;Posterior    Pain Descriptors / Indicators  Sore                        [facility] Adult PT Treatment/Exercise - 03/14/18 0001      Knee/Hip Exercises: Stretches   Hip Flexor Stretch  Both;30 seconds +quad stretch with strap    Other Knee/Hip Stretches  figure 4 push & pull      Knee/Hip Exercises: Aerobic   Nustep  5 min L6 UE & LE      Modalities   Modalities  Iontophoresis      Iontophoresis   Type of Iontophoresis  Dexamethasone    Location  Rt Gr troch    Dose  1cc    Time  6 hr wear time      Manual Therapy   Soft tissue mobilization  IASTM ITB, VL, TFL, glut med/min             PT Education - 03/14/18 1149    Education provided  Yes    Education Details  discussion of night pain, radicular pain vs bursitis pain/ITB, ionto, avoiding irritating positions, pillow in car    Person(s) Educated  Patient    Methods  Explanation;Demonstration;Tactile cues;Verbal cues;Handout  Comprehension  Verbalized understanding;Need further instruction;Returned demonstration;Verbal cues required;Tactile cues required          PT Long Term Goals - 02/18/18 1053      PT LONG TERM GOAL #1   Title  Pt will be able to navigat stairs step over step with use of hand rail for safety    Baseline  unable to lead with Rt leg, has caused multiple falls    Time  6    Period  Weeks    Status  Unable to assess      PT LONG TERM GOAL #2   Title  Gross LE strength to 5/5 for proper support to lumbopelvic biomechanical chain    Baseline  NT at eval due to notable pelvic rotation    Time  6    Period  Weeks    Status  On-going      PT LONG TERM GOAL #3   Title  Pt will be able to run errands pain <=3/10    Baseline  severe and has to "drag myself" back to my car at the end of the day    Period  Weeks    Status  New      PT LONG TERM GOAL #4   Title  FOTO to 42% limited to indicate significant improvement in functional ability    Baseline  47% limited at eval    Time  6    Period  Weeks    Status  New             Plan - 03/14/18 1055    Clinical Impression Statement  s/s consistent with irritation of bursitis in Rt hip that extended pain down ITB. Notable tightness along VL as well and reduced with IASTM. Placed ionto patch to bursa. Pt reports she has had injections before that were very helpful and may ask Dr Tamala Julian about getting another one. ERO at next visit.     PT Treatment/Interventions  ADLs/Self Care Home Management;Cryotherapy;Electrical Stimulation;Ultrasound;Traction;Moist Heat;Iontophoresis 4mg /ml Dexamethasone;Gait training;Stair training;Therapeutic activities;Therapeutic exercise;Balance training;Patient/family education;Neuromuscular re-education;Manual techniques;Passive range of motion;Vasopneumatic Device;Taping;Dry needling    PT Next Visit Plan  ERO    PT Home Exercise Plan  pelvic tilt, sidelying clam, avoid crossing legs, piriformis stretch; prone on elbows, prone press ups, trunk rotation in gait; sidelying & door QL stretch, qped alt UE flx, row; thomas test stretch+quad with strap    Consulted and Agree with Plan of Care  Patient       Patient will benefit from skilled therapeutic intervention in order to improve the following deficits and impairments:  Pain, Improper body mechanics, Increased muscle spasms, Decreased activity tolerance, Decreased strength, Difficulty walking  Visit Diagnosis: Chronic right-sided low back pain, with sciatica presence unspecified  Pain in right hip  Pain in left hip     Problem List Patient Active Problem List   Diagnosis Date Noted  . Dysuria 03/08/2018  . Subluxation of peroneal tendon of right foot 11/10/2017  . Gluteal tendonitis of right buttock 10/19/2017  . Lumbar radiculopathy, right 06/23/2017  . Closed displaced fracture of fifth metatarsal bone of left foot 03/16/2017  . GERD (gastroesophageal reflux disease) 12/25/2016  . Greater trochanteric bursitis of left hip 06/17/2016  . Diabetes ([place]) 04/02/2016  .  Nonallopathic lesion of lumbosacral region 01/15/2016  . Greater trochanteric bursitis of right hip 11/27/2015  . Routine general medical examination at a health care facility 11/25/2015  . Nonallopathic lesion of thoracic region 09/26/2014  .  Nonallopathic lesion-rib cage 09/26/2014  . Nonallopathic lesion of cervical region 07/19/2014  . Hx of osteopenia 10/11/2012  . Seasonal allergic rhinitis 03/21/2012  . Fibromyalgia 08/12/2011  . Palpitations 04/15/2011  . DEPRESSIVE DISORDER NOT ELSEWHERE CLASSIFIED 03/27/2010  . Vitamin D deficiency 05/25/2008  . Lumbar radiculopathy 04/11/2008  . Essential hypertension 03/14/2008  . TRANSIENT ISCHEMIC ATTACKS, HX OF 02/02/2008  . Hyperlipidemia 01/18/2007   Sargon Scouten C. Tempestt Silba PT, DPT 03/14/18 11:51 AM   [facility] [address] [place], [place], [address] Phone: [phone]   Fax:  [phone]  Name: REINE BRISTOW MRN: [id number] Date of Birth: 1951/03/09

## 2018-03-16 ENCOUNTER — Encounter: Payer: Self-pay | Admitting: Physical Therapy

## 2018-03-16 ENCOUNTER — Ambulatory Visit: Payer: Medicare Other | Admitting: Physical Therapy

## 2018-03-16 DIAGNOSIS — M545 Low back pain: Secondary | ICD-10-CM | POA: Diagnosis not present

## 2018-03-16 DIAGNOSIS — M25552 Pain in left hip: Secondary | ICD-10-CM | POA: Diagnosis not present

## 2018-03-16 DIAGNOSIS — M25551 Pain in right hip: Secondary | ICD-10-CM

## 2018-03-16 DIAGNOSIS — G8929 Other chronic pain: Secondary | ICD-10-CM

## 2018-03-16 NOTE — Therapy (Signed)
[place], [place], [address] Phone: [phone]   Fax:  [phone]  Physical Therapy Treatment/Re-evaluation  Patient Details  Name: Christine [name] MRN: [id number] Date of Birth: 15-Aug-1951 Referring Provider: Hulan Saas, DO   Encounter Date: 03/16/2018  PT End of Session - 03/16/18 1146    Visit Number  11    Number of Visits  19    Date for PT Re-Evaluation  04/22/18    Authorization Type  MCARE     PT Start Time  1146    PT Stop Time  1228    PT Time Calculation (min)  42 min    Activity Tolerance  Patient tolerated treatment well    Behavior During Therapy  [facility] for tasks assessed/performed       Past Medical History:  Diagnosis Date  . Allergic rhinitis   . Diabetes mellitus   . History of recurrent UTIs    [name]  . HTN (hypertension)   . Hyperlipidemia   . Migraine headache    quiescent  . Sleep disorder    [name] May  . TIA (transient ischemic attack)     Past Surgical History:  Procedure Laterality Date  . COLONOSCOPY     Dr Carlean [name]; hemorrhoids  . CORONARY ANGIOPLASTY  1997   for chest pain- negative   . POLYPECTOMY  2002   benign, hyperplastic polyp; rectal bleeding 2008  . TONSILLECTOMY    . TOTAL ABDOMINAL HYSTERECTOMY  1983   BSO for endometriosis  . WISDOM TOOTH EXTRACTION      There were no vitals filed for this visit.  Subjective Assessment - 03/16/18 1146    Subjective  Monday afternoon was hurting low on both sides, reduced with heat pad. Felt like ionto was helpful.     Patient Stated Goals  sleep, house work, shopping, stairs-leading with Rt leg    Currently in Pain?  Yes    Pain Score  2     Pain Location  Hip    Pain Orientation  Right;Lateral    Pain Descriptors / Indicators  Sharp only with pressure    Aggravating Factors   getting up after sitting for too long, get into/out of car    Pain Relieving Factors  heat, stretches, extension          [facility] PT Assessment - 03/16/18 0001      Assessment   Medical Diagnosis  LBP    Referring Provider  Hulan Saas, DO      Observation/Other Assessments   Focus on Therapeutic Outcomes (FOTO)   46% limited                   [place] Adult PT Treatment/Exercise - 03/16/18 0001      Knee/Hip Exercises: Stretches   Piriformis Stretch  Both;30 seconds    Other Knee/Hip Stretches  supine HSS & ITB      Knee/Hip Exercises: Supine   Bridges with Clamshell  15 reps green tband      Knee/Hip Exercises: Sidelying   Hip ABduction  20 reps abd+ext    Clams  x30 Rt      Iontophoresis   Type of Iontophoresis  Dexamethasone    Location  Rt Gr troch    Dose  1cc    Time  6 hr wear time             PT Education - 03/16/18 1237    Education provided  Yes    Education Details  goals, progress, anatomy of condition, POC, FOTO, rest breaks    Person(s) Educated  Patient    Methods  Explanation;Demonstration;Tactile cues;Verbal cues;Handout    Comprehension  Verbalized understanding;Need further instruction;Returned demonstration;Verbal cues required;Tactile cues required          PT Long Term Goals - 03/16/18 1150      PT LONG TERM GOAL #1   Title  Pt will be able to navigat stairs step over step with use of hand rail for safety    Baseline  okay on standard steps, difficulty with large step outside of back door.     Status  Achieved      PT LONG TERM GOAL #2   Title  Gross LE strength to 5/5 for proper support to lumbopelvic biomechanical chain    Baseline  Rt hip abd 4/5, bil ext 4+/5    Status  On-going      PT LONG TERM GOAL #3   Title  Pt will be able to run errands pain <=3/10    Baseline  I can be doing fine but it can go to a 6/10 really fast.     Status  On-going      PT LONG TERM GOAL #4   Title  FOTO to 42% limited to indicate significant improvement in functional ability    Baseline  46% limited    Status  On-going            Plan  - 03/16/18 1239    Clinical Impression Statement  Only pain noted today was with direct palpation to gr troch bursa. Occasional LBP with too much activity and we discussed the importance of taking rest breaks before allowing pain to get into high levels. Weakness noted in hip abductors resulting in overuse of TFL and pressure to bursa. Pt will benefit from further skilled PT in order to continue strengthening lunbopelvic region and improving functional endurance. Pt has company in town next week and was encouraged to do exercises and take rest breaks.     PT Frequency  2x / week    PT Duration  4 weeks    PT Treatment/Interventions  ADLs/Self Care Home Management;Cryotherapy;Electrical Stimulation;Ultrasound;Traction;Moist Heat;Iontophoresis 4mg /ml Dexamethasone;Gait training;Stair training;Therapeutic activities;Therapeutic exercise;Balance training;Patient/family education;Neuromuscular re-education;Manual techniques;Passive range of motion;Vasopneumatic Device;Taping;Dry needling    PT Home Exercise Plan  pelvic tilt, sidelying clam, avoid crossing legs, piriformis stretch; prone on elbows, prone press ups, trunk rotation in gait; sidelying & door QL stretch, qped alt UE flx, row; thomas test stretch+quad with strap; clam, hip abd+ext, SLS    Consulted and Agree with Plan of Care  Patient       Patient will benefit from skilled therapeutic intervention in order to improve the following deficits and impairments:  Pain, Improper body mechanics, Increased muscle spasms, Decreased activity tolerance, Decreased strength, Difficulty walking  Visit Diagnosis: Chronic right-sided low back pain, with sciatica presence unspecified - Plan: PT plan of care cert/re-cert  Pain in right hip - Plan: PT plan of care cert/re-cert  Pain in left hip - Plan: PT plan of care cert/re-cert     Problem List Patient Active Problem List   Diagnosis Date Noted  . Dysuria 03/08/2018  . Subluxation of peroneal tendon  of right foot 11/10/2017  . Gluteal tendonitis of right buttock 10/19/2017  . Lumbar radiculopathy, right 06/23/2017  . Closed displaced fracture of fifth metatarsal bone of left foot 03/16/2017  . GERD (gastroesophageal reflux disease)  12/25/2016  . Greater trochanteric bursitis of left hip 06/17/2016  . Diabetes ([place]) 04/02/2016  . Nonallopathic lesion of lumbosacral region 01/15/2016  . Greater trochanteric bursitis of right hip 11/27/2015  . Routine general medical examination at a health care facility 11/25/2015  . Nonallopathic lesion of thoracic region 09/26/2014  . Nonallopathic lesion-rib cage 09/26/2014  . Nonallopathic lesion of cervical region 07/19/2014  . Hx of osteopenia 10/11/2012  . Seasonal allergic rhinitis 03/21/2012  . Fibromyalgia 08/12/2011  . Palpitations 04/15/2011  . DEPRESSIVE DISORDER NOT ELSEWHERE CLASSIFIED 03/27/2010  . Vitamin D deficiency 05/25/2008  . Lumbar radiculopathy 04/11/2008  . Essential hypertension 03/14/2008  . TRANSIENT ISCHEMIC ATTACKS, HX OF 02/02/2008  . Hyperlipidemia 01/18/2007   Mychael Soots C. Colin Ellers PT, DPT 03/16/18 12:50 PM   [facility] [address] [place], [place], [address] Phone: [phone]   Fax:  [phone]  Name: IVOREE FELMLEE MRN: [id number] Date of Birth: 1951-02-19

## 2018-03-21 ENCOUNTER — Other Ambulatory Visit: Payer: Self-pay | Admitting: *Deleted

## 2018-03-21 MED ORDER — TRAZODONE HCL 50 MG PO TABS: 25.0000 mg | ORAL_TABLET | Freq: Every evening | ORAL | 0 refills | Status: DC | PRN

## 2018-03-23 NOTE — Progress Notes (Signed)
[name] [place], [place] [address] Phone: [phone] Subjective:      CC: Back pain follow-up  HBZ:[id number]  Naria FINLEY DINKEL is a 67 y.o. female coming in with complaint of back pain. She is still having trouble with prolonged siting or walking long distances. She is doing physical therapy which has alleviated some of her pain.  Patient will states that the physical therapy has been beneficial.  Feels like she is noticing that she can do more activities with less pain.        Past Medical History:  Diagnosis Date  . Allergic rhinitis   . Diabetes mellitus   . History of recurrent UTIs    [name]  . HTN (hypertension)   . Hyperlipidemia   . Migraine headache    quiescent  . Sleep disorder    [name] May  . TIA (transient ischemic attack)    Past Surgical History:  Procedure Laterality Date  . COLONOSCOPY     Dr Carlean [name]; hemorrhoids  . CORONARY ANGIOPLASTY  1997   for chest pain- negative   . POLYPECTOMY  2002   benign, hyperplastic polyp; rectal bleeding 2008  . TONSILLECTOMY    . TOTAL ABDOMINAL HYSTERECTOMY  1983   BSO for endometriosis  . WISDOM TOOTH EXTRACTION     Social History   Socioeconomic History  . Marital status: Married    Spouse name: Not on file  . Number of children: Not on file  . Years of education: Not on file  . Highest education level: Not on file  Occupational History  . Occupation: Designer, television/film set: [employer]  Social Needs  . Financial resource strain: Not on file  . Food insecurity:    Worry: Not on file    Inability: Not on file  . Transportation needs:    Medical: Not on file    Non-medical: Not on file  Tobacco Use  . Smoking status: Never Smoker  . Smokeless tobacco: Never Used  Substance and Sexual Activity  . Alcohol use: No  . Drug use: No  . Sexual activity: Not on file  Lifestyle  . Physical activity:    Days per week: Not on  file    Minutes per session: Not on file  . Stress: Not on file  Relationships  . Social connections:    Talks on phone: Not on file    Gets together: Not on file    Attends religious service: Not on file    Active member of club or organization: Not on file    Attends meetings of clubs or organizations: Not on file    Relationship status: Not on file  Other Topics Concern  . Not on file  Social History Narrative   Regular exercise- no    Allergies  Allergen Reactions  . Vioxx [Rofecoxib] Other (See Comments)    Excess BP which caused TIA   . Prednisone Other (See Comments)    Mental status changes No associated rash or fever  . Colesevelam Other (See Comments)    Leg Pain   Family History  Problem Relation Age of Onset  . Colon cancer Maternal Aunt   . Diabetes Paternal Uncle   . Heart attack Father 56  . COPD Mother   . Lung cancer Brother        smoker  . Mental illness Unknown        niece, committed  suicide.  [name] Heart attack Unknown        brother X 2; @ 65 & 25  . Stroke Neg Hx      Past medical history, social, surgical and family history all reviewed in electronic medical record.  No pertanent information unless stated regarding to the chief complaint.   Review of Systems:Review of systems updated and as accurate as of 03/24/18  No headache, visual changes, nausea, vomiting, diarrhea, constipation, dizziness, abdominal pain, skin rash, fevers, chills, night sweats, weight loss, swollen lymph nodes, body aches, joint swelling,, chest pain, shortness of breath, mood changes.  Positive muscle aches  Objective  Blood pressure 124/82, pulse 85, height 5\' 6"  (1.676 m), weight 192 lb (87.1 kg), SpO2 98 %. Systems examined below as of 03/24/18   General: No apparent distress alert and oriented x3 mood and affect normal, dressed appropriately.  HEENT: Pupils equal, extraocular movements intact  Respiratory: Patient's speak in full sentences and does not appear  short of breath  Cardiovascular: No lower extremity edema, non tender, no erythema  Skin: Warm dry intact with no signs of infection or rash on extremities or on axial skeleton.  Abdomen: Soft nontender  Neuro: Cranial nerves II through XII are intact, neurovascularly intact in all extremities with 2+ DTRs and 2+ pulses.  Lymph: No lymphadenopathy of posterior or anterior cervical chain or axillae bilaterally.  Gait normal with good balance and coordination.  MSK:  Non tender with full range of motion and good stability and symmetric strength and tone of shoulders, elbows, wrist, hip, knee and ankles bilaterally.  Mild arthritic changes of multiple joints he does have diffuse tenderness of multiple different trigger points.  Patient's neck exam does show some mild loss of lordosis.  Patient does have increasing kyphosis of the upper thoracic spine.  Diffusely tender.  Near full range of motion but is lacking last 5 degrees of extension of the neck.  Significant tightness of the trapezius muscles bilaterally.  Low back exam still shows positive Corky Sox on the right side.  Some mild radicular symptoms noted  Osteopathic findings C2 flexed rotated and side bent right C4 flexed rotated and side bent left C6 flexed rotated and side bent left T3 extended rotated and side bent right inhaled third rib T9 extended rotated and side bent left L4 flexed rotated and side bent right Sacrum right on right      Impression and Recommendations:     This case required medical decision making of moderate complexity.      Note: This dictation was prepared with Dragon dictation along with smaller phrase technology. Any transcriptional errors that result from this process are unintentional.

## 2018-03-24 ENCOUNTER — Ambulatory Visit (INDEPENDENT_AMBULATORY_CARE_PROVIDER_SITE_OTHER): Payer: Medicare Other | Admitting: Family Medicine

## 2018-03-24 VITALS — BP 124/82 | HR 85 | Ht 66.0 in | Wt 192.0 lb

## 2018-03-24 DIAGNOSIS — M999 Biomechanical lesion, unspecified: Secondary | ICD-10-CM

## 2018-03-24 DIAGNOSIS — M5416 Radiculopathy, lumbar region: Secondary | ICD-10-CM

## 2018-03-24 NOTE — Assessment & Plan Note (Signed)
Decision today to treat with OMT was based on Physical Exam  After verbal consent patient was treated with HVLA, ME, FPR techniques in cervical, thoracic, lumbar and sacral areas  Patient tolerated the procedure well with improvement in symptoms  Patient given exercises, stretches and lifestyle modifications  See medications in patient instructions if given  Patient will follow up in 4-6 weeks 

## 2018-03-24 NOTE — Patient Instructions (Signed)
Good to see you  Alvera Singh is your friend.  Stay active.  Keep working hard See me again in 6-7 weeks!

## 2018-03-24 NOTE — Assessment & Plan Note (Signed)
Stable.  Does not seem to have as many radicular symptoms.  Discussed icing regimen and home exercises.  Discussed which activities to doing which wants to avoid.  Patient is to increase activity slowly over the course the next several days.  Follow-up with me again in 4-6 weeks

## 2018-03-27 ENCOUNTER — Other Ambulatory Visit: Payer: Self-pay | Admitting: Internal Medicine

## 2018-03-28 ENCOUNTER — Encounter: Payer: Self-pay | Admitting: Physical Therapy

## 2018-03-28 ENCOUNTER — Ambulatory Visit: Payer: Medicare Other | Admitting: Physical Therapy

## 2018-03-28 DIAGNOSIS — M25551 Pain in right hip: Secondary | ICD-10-CM

## 2018-03-28 DIAGNOSIS — G8929 Other chronic pain: Secondary | ICD-10-CM

## 2018-03-28 DIAGNOSIS — M25552 Pain in left hip: Secondary | ICD-10-CM | POA: Diagnosis not present

## 2018-03-28 DIAGNOSIS — M545 Low back pain: Principal | ICD-10-CM

## 2018-03-28 NOTE — Therapy (Signed)
[place], [place], [address] Phone: [phone]   Fax:  [phone]  Physical Therapy Treatment  Patient Details  Name: Christine [name] MRN: [id number] Date of Birth: [birth year] Referring Provider: Hulan Saas, DO   Encounter Date: 03/28/2018  PT End of Session - 03/28/18 1016    Visit Number  12    Number of Visits  19    Date for PT Re-Evaluation  04/22/18    Authorization Type  [employer]ARE     PT Start Time  1015    PT Stop Time  1058    PT Time Calculation (min)  43 min    Activity Tolerance  Patient tolerated treatment well    Behavior During Therapy  [facility] for tasks assessed/performed       Past Medical History:  Diagnosis Date  . Allergic rhinitis   . Diabetes mellitus   . History of recurrent UTIs    [name]  . HTN (hypertension)   . Hyperlipidemia   . Migraine headache    quiescent  . Sleep disorder    [name] May  . TIA (transient ischemic attack)     Past Surgical History:  Procedure Laterality Date  . COLONOSCOPY     Dr Carlean [name]; hemorrhoids  . CORONARY ANGIOPLASTY  1997   for chest pain- negative   . POLYPECTOMY  2002   benign, hyperplastic polyp; rectal bleeding 2008  . TONSILLECTOMY    . TOTAL ABDOMINAL HYSTERECTOMY  1983   BSO for endometriosis  . WISDOM TOOTH EXTRACTION      There were no vitals filed for this visit.  Subjective Assessment - 03/28/18 1016    Subjective  Not much pain. A couple of "zingers" in Rt Gr troch when walking. I did a lot of sitting yesterday.     Patient Stated Goals  sleep, house work, shopping, stairs-leading with Rt leg    Currently in Pain?  No/denies                       [facility] Adult PT Treatment/Exercise - 03/28/18 0001      Lumbar Exercises: Standing   Other Standing Lumbar Exercises  hip hinge with dowel    Other Standing Lumbar Exercises  wobble board in //bars      Knee/Hip Exercises: Stretches   Passive  Hamstring Stretch  Both;30 seconds midline & adduction    Hip Flexor Stretch  Both;30 seconds prone with strap      Knee/Hip Exercises: Aerobic   Nustep  5 min L5 LE only      Knee/Hip Exercises: Standing   Other Standing Knee Exercises  hip ext with knee flexed at counter      Manual Therapy   Soft tissue mobilization  IASTM TFL, glut med/min, VL, hamstrings/ITB                  PT Long Term Goals - 03/16/18 1150      PT LONG TERM GOAL #1   Title  Pt will be able to navigat stairs step over step with use of hand rail for safety    Baseline  okay on standard steps, difficulty with large step outside of back door.     Status  Achieved      PT LONG TERM GOAL #2   Title  Gross LE strength to 5/5 for proper support to lumbopelvic biomechanical chain    Baseline  Rt hip abd  4/5, bil ext 4+/5    Status  On-going      PT LONG TERM GOAL #3   Title  Pt will be able to run errands pain <=3/10    Baseline  I can be doing fine but it can go to a 6/10 really fast.     Status  On-going      PT LONG TERM GOAL #4   Title  FOTO to 42% limited to indicate significant improvement in functional ability    Baseline  46% limited    Status  On-going            Plan - 03/28/18 1108    Clinical Impression Statement  Trigger point noted in TFL today that caused concordant pain. Utilized wobble board and wand for hip hinge and balance control in proper form. Pt reported feeling good after treatment. Asked pt to be aware over the next week or so and will d/c early if appropriate.     PT Treatment/Interventions  ADLs/Self Care Home Management;Cryotherapy;Electrical Stimulation;Ultrasound;Traction;Moist Heat;Iontophoresis 4mg /ml Dexamethasone;Gait training;Stair training;Therapeutic activities;Therapeutic exercise;Balance training;Patient/family education;Neuromuscular re-education;Manual techniques;Passive range of motion;Vasopneumatic Device;Taping;Dry needling    PT Next Visit Plan   wobble board    PT Home Exercise Plan  pelvic tilt, sidelying clam, avoid crossing legs, piriformis stretch; prone on elbows, prone press ups, trunk rotation in gait; sidelying & door QL stretch, qped alt UE flx, row; thomas test stretch+quad with strap; clam, hip abd+ext, SLS    Consulted and Agree with Plan of Care  Patient       Patient will benefit from skilled therapeutic intervention in order to improve the following deficits and impairments:  Pain, Improper body mechanics, Increased muscle spasms, Decreased activity tolerance, Decreased strength, Difficulty walking  Visit Diagnosis: Chronic right-sided low back pain, with sciatica presence unspecified  Pain in right hip  Pain in left hip     Problem List Patient Active Problem List   Diagnosis Date Noted  . Dysuria 03/08/2018  . Subluxation of peroneal tendon of right foot 11/10/2017  . Gluteal tendonitis of right buttock 10/19/2017  . Lumbar radiculopathy, right 06/23/2017  . Closed displaced fracture of fifth metatarsal bone of left foot 03/16/2017  . GERD (gastroesophageal reflux disease) 12/25/2016  . Greater trochanteric bursitis of left hip 06/17/2016  . Diabetes ([place]) 04/02/2016  . Nonallopathic lesion of lumbosacral region 01/15/2016  . Greater trochanteric bursitis of right hip 11/27/2015  . Routine general medical examination at a health care facility 11/25/2015  . Nonallopathic lesion of thoracic region 09/26/2014  . Nonallopathic lesion-rib cage 09/26/2014  . Nonallopathic lesion of cervical region 07/19/2014  . Hx of osteopenia 10/11/2012  . Seasonal allergic rhinitis 03/21/2012  . Fibromyalgia 08/12/2011  . Palpitations 04/15/2011  . DEPRESSIVE DISORDER NOT ELSEWHERE CLASSIFIED 03/27/2010  . Vitamin D deficiency 05/25/2008  . Lumbar radiculopathy 04/11/2008  . Essential hypertension 03/14/2008  . TRANSIENT ISCHEMIC ATTACKS, HX OF 02/02/2008  . Hyperlipidemia 01/18/2007   Zhane Bluitt C. Alen Matheson PT,  DPT 03/28/18 11:12 AM   [place], [place], [address] Phone: [phone]   Fax:  [phone]  Name: Christine [name] MRN: [id number] Date of Birth: 1951/08/10

## 2018-03-30 ENCOUNTER — Encounter: Payer: Self-pay | Admitting: Physical Therapy

## 2018-03-30 ENCOUNTER — Other Ambulatory Visit: Payer: Self-pay | Admitting: Internal Medicine

## 2018-03-30 ENCOUNTER — Ambulatory Visit: Payer: Medicare Other | Admitting: Physical Therapy

## 2018-03-30 DIAGNOSIS — M545 Low back pain: Principal | ICD-10-CM

## 2018-03-30 DIAGNOSIS — M25551 Pain in right hip: Secondary | ICD-10-CM

## 2018-03-30 DIAGNOSIS — M25552 Pain in left hip: Secondary | ICD-10-CM | POA: Diagnosis not present

## 2018-03-30 DIAGNOSIS — G8929 Other chronic pain: Secondary | ICD-10-CM | POA: Diagnosis not present

## 2018-03-30 NOTE — Therapy (Signed)
[place], [place], [address] Phone: [phone]   Fax:  [phone]  Physical Therapy Treatment/Discharge Summary  Patient Details  Name: Christine [name] MRN: [id number] Date of Birth: 19-Mar-1951 Referring Provider: Hulan Saas, DO   Encounter Date: 03/30/2018  PT End of Session - 03/30/18 1019    Visit Number  13    Number of Visits  19    Date for PT Re-Evaluation  04/22/18    Authorization Type  [employer]     PT Start Time  1015    PT Stop Time  1053    PT Time Calculation (min)  38 min    Activity Tolerance  Patient tolerated treatment well    Behavior During Therapy  [facility] for tasks assessed/performed       Past Medical History:  Diagnosis Date  . Allergic rhinitis   . Diabetes mellitus   . History of recurrent UTIs    [name]  . HTN (hypertension)   . Hyperlipidemia   . Migraine headache    quiescent  . Sleep disorder    [name] May  . TIA (transient ischemic attack)     Past Surgical History:  Procedure Laterality Date  . COLONOSCOPY     Dr Carlean [name]; hemorrhoids  . CORONARY ANGIOPLASTY  1997   for chest pain- negative   . POLYPECTOMY  2002   benign, hyperplastic polyp; rectal bleeding 2008  . TONSILLECTOMY    . TOTAL ABDOMINAL HYSTERECTOMY  1983   BSO for endometriosis  . WISDOM TOOTH EXTRACTION      There were no vitals filed for this visit.  Subjective Assessment - 03/30/18 1019    Subjective  Burning/stinging at bil greater trochanter. Calves were sore after last visit.     Patient Stated Goals  sleep, house work, shopping, stairs-leading with Rt leg    Currently in Pain?  Yes    Pain Score  4     Pain Location  Hip    Pain Orientation  Right;Left    Pain Descriptors / Indicators  Burning stinging         [facility] PT Assessment - 03/30/18 0001      Assessment   Medical Diagnosis  LBP    Referring Provider  Hulan Saas, DO      Observation/Other Assessments   Focus on Therapeutic Outcomes (FOTO)   28% limited      Sensation   Additional Comments  denies radicular pain       AROM   Overall AROM Comments  extends at midline without pain      Strength   Overall Strength Comments  bil hip abd 4/5                   [facility] Adult PT Treatment/Exercise - 03/30/18 0001      Knee/Hip Exercises: Stretches   Passive Hamstring Stretch  Both;30 seconds seated EOB      Knee/Hip Exercises: Aerobic   Nustep  UE & LE 5 min L8      Manual Therapy   Soft tissue mobilization  IASTM Rt TFL, VL, ITB             PT Education - 03/30/18 1153    Education provided  Yes    Education Details  FOTO, goals, progress, HEP, anatomy of condition, importance of continued exercise, shoe wear    Person(s) Educated  Patient    Methods  Explanation  Comprehension  Verbalized understanding          PT Long Term Goals - 03/30/18 1154      PT LONG TERM GOAL #1   Title  Pt will be able to navigat stairs step over step with use of hand rail for safety    Baseline  was able to do step at door without sensation of giving out, still nervous but is able    Status  Achieved      PT LONG TERM GOAL #2   Title  Gross LE strength to 5/5 for proper support to lumbopelvic biomechanical chain    Baseline  bil hip abd 4/5    Status  Not Met      PT LONG TERM GOAL #3   Title  Pt will be able to run errands pain <=3/10    Baseline  avg 3/10    Status  Achieved      PT LONG TERM GOAL #4   Title  FOTO to 42% limited to indicate significant improvement in functional ability    Baseline  28% limited    Status  Achieved            Plan - 03/30/18 1155    Clinical Impression Statement  Back and leg pain have resolved and pt has met most of her goals but continues to have pain bilaterally at greater trochanter (R>L). Asked pt to wear tennis shoes around her house since she says she tends to be barefoot. Pt verbalized readiness for d/c and understands  importance of continued exercise. Encouraged to contact us with any further questions.     PT Treatment/Interventions  ADLs/Self Care Home Management;Cryotherapy;Electrical Stimulation;Ultrasound;Traction;Moist Heat;Iontophoresis 97m/ml Dexamethasone;Gait tScientist, forensicTherapeutic activities;Therapeutic exercise;Balance training;Patient/family education;Neuromuscular re-education;Manual techniques;Passive range of motion;Vasopneumatic Device;Taping;Dry needling    PT Home Exercise Plan  pelvic tilt, sidelying clam, avoid crossing legs, piriformis stretch; prone on elbows, prone press ups, trunk rotation in gait; sidelying & door QL stretch, qped alt UE flx, row; thomas test stretch+quad with strap; clam, hip abd+ext, SLS    Consulted and Agree with Plan of Care  Patient       Patient will benefit from skilled therapeutic intervention in order to improve the following deficits and impairments:  Pain, Improper body mechanics, Increased muscle spasms, Decreased activity tolerance, Decreased strength, Difficulty walking  Visit Diagnosis: Chronic right-sided low back pain, with sciatica presence unspecified  Pain in right hip  Pain in left hip     Problem List Patient Active Problem List   Diagnosis Date Noted  . Dysuria 03/08/2018  . Subluxation of peroneal tendon of right foot 11/10/2017  . Gluteal tendonitis of right buttock 10/19/2017  . Lumbar radiculopathy, right 06/23/2017  . Closed displaced fracture of fifth metatarsal bone of left foot 03/16/2017  . GERD (gastroesophageal reflux disease) 12/25/2016  . Greater trochanteric bursitis of left hip 06/17/2016  . Diabetes ([place] 04/02/2016  . Nonallopathic lesion of lumbosacral region 01/15/2016  . Greater trochanteric bursitis of right hip 11/27/2015  . Routine general medical examination at a health care facility 11/25/2015  . Nonallopathic lesion of thoracic region 09/26/2014  . Nonallopathic lesion-rib cage 09/26/2014  .  Nonallopathic lesion of cervical region 07/19/2014  . Hx of osteopenia 10/11/2012  . Seasonal allergic rhinitis 03/21/2012  . Fibromyalgia 08/12/2011  . Palpitations 04/15/2011  . DEPRESSIVE DISORDER NOT ELSEWHERE CLASSIFIED 03/27/2010  . Vitamin D deficiency 05/25/2008  . Lumbar radiculopathy 04/11/2008  . Essential hypertension 03/14/2008  . TRANSIENT ISCHEMIC  ATTACKS, HX OF 02/02/2008  . Hyperlipidemia 01/18/2007    PHYSICAL THERAPY DISCHARGE SUMMARY  Visits from Start of Care: 13  Current functional level related to goals / functional outcomes: See above   Remaining deficits: See above   Education / Equipment: Anatomy of condition, POC, HEP, exercise form/rationale  Plan: Patient agrees to discharge.  Patient goals were partially met. Patient is being discharged due to being pleased with the current functional level.  ?????     Bee Hammerschmidt C. Louellen Haldeman PT, DPT 03/30/18 11:58 AM   [place] [facility] [address] [place], [place], [address] Phone: [phone]   Fax:  [phone]  Name: Christine [name] MRN: [id number] Date of Birth: 1951/11/16

## 2018-04-04 ENCOUNTER — Ambulatory Visit: Payer: Medicare Other | Admitting: Physical Therapy

## 2018-04-05 ENCOUNTER — Encounter: Payer: Self-pay | Admitting: Endocrinology

## 2018-04-06 ENCOUNTER — Ambulatory Visit: Payer: Medicare Other | Admitting: Physical Therapy

## 2018-04-08 ENCOUNTER — Encounter: Payer: Self-pay | Admitting: Neurology

## 2018-04-11 ENCOUNTER — Encounter: Payer: Medicare Other | Admitting: Physical Therapy

## 2018-04-13 ENCOUNTER — Encounter: Payer: Medicare Other | Admitting: Physical Therapy

## 2018-04-13 DIAGNOSIS — F4321 Adjustment disorder with depressed mood: Secondary | ICD-10-CM | POA: Diagnosis not present

## 2018-04-18 ENCOUNTER — Encounter: Payer: Medicare Other | Admitting: Physical Therapy

## 2018-04-20 ENCOUNTER — Encounter: Payer: Medicare Other | Admitting: Physical Therapy

## 2018-05-04 NOTE — Progress Notes (Signed)
[name] [place], [place] [address] Phone: [phone] Subjective:     CC: Neck and back pain follow-up  HWE:[id number]  [name] is a 67 y.o. female coming in with complaint of neck and back pain.  Making progress.  Patient states that she has noticed with physical therapy she is not having as intense pain.  Has decreased the tramadol to 1 time a week.  Patient is taking all the other vitamins.  Denies any numbness or tingling.     Past Medical History:  Diagnosis Date  . Allergic rhinitis   . Diabetes mellitus   . History of recurrent UTIs    [name]  . HTN (hypertension)   . Hyperlipidemia   . Migraine headache    quiescent  . Sleep disorder    [name] May  . TIA (transient ischemic attack)    Past Surgical History:  Procedure Laterality Date  . COLONOSCOPY     Dr Carlean [name]; hemorrhoids  . CORONARY ANGIOPLASTY  1997   for chest pain- negative   . POLYPECTOMY  2002   benign, hyperplastic polyp; rectal bleeding 2008  . TONSILLECTOMY    . TOTAL ABDOMINAL HYSTERECTOMY  1983   BSO for endometriosis  . WISDOM TOOTH EXTRACTION     Social History   Socioeconomic History  . Marital status: Married    Spouse name: Not on file  . Number of children: Not on file  . Years of education: Not on file  . Highest education level: Not on file  Occupational History  . Occupation: Designer, television/film set: [employer]  Social Needs  . Financial resource strain: Not on file  . Food insecurity:    Worry: Not on file    Inability: Not on file  . Transportation needs:    Medical: Not on file    Non-medical: Not on file  Tobacco Use  . Smoking status: Never Smoker  . Smokeless tobacco: Never Used  Substance and Sexual Activity  . Alcohol use: No  . Drug use: No  . Sexual activity: Not on file  Lifestyle  . Physical activity:    Days per week: Not on file    Minutes per session: Not on file  .  Stress: Not on file  Relationships  . Social connections:    Talks on phone: Not on file    Gets together: Not on file    Attends religious service: Not on file    Active member of club or organization: Not on file    Attends meetings of clubs or organizations: Not on file    Relationship status: Not on file  Other Topics Concern  . Not on file  Social History Narrative   Regular exercise- no    Allergies  Allergen Reactions  . Vioxx [Rofecoxib] Other (See Comments)    Excess BP which caused TIA   . Prednisone Other (See Comments)    Mental status changes No associated rash or fever  . Colesevelam Other (See Comments)    Leg Pain   Family History  Problem Relation Age of Onset  . Colon cancer Maternal Aunt   . Diabetes Paternal Uncle   . Heart attack Father 57  . COPD Mother   . Lung cancer Brother        smoker  . Mental illness Unknown        niece, committed suicide.  [name] Heart attack Unknown  brother X 2; @ 47 & 60  . Stroke Neg Hx      Past medical history, social, surgical and family history all reviewed in electronic medical record.  No pertanent information unless stated regarding to the chief complaint.   Review of Systems:Review of systems updated and as accurate as of 05/05/18  No  visual changes, nausea, vomiting, diarrhea, constipation, dizziness, abdominal pain, skin rash, fevers, chills, night sweats, weight loss, swollen lymph nodes, body aches, joint swelling,  chest pain, shortness of breath, mood changes.  Positive headache and muscle aches  Objective  Blood pressure (!) 122/92, pulse 87, height 5\' 6"  (1.676 m), weight 192 lb (87.1 kg), SpO2 95 %. Systems examined below as of 05/05/18   General: No apparent distress alert and oriented x3 mood and affect normal, dressed appropriately.  HEENT: Pupils equal, extraocular movements intact  Respiratory: Patient's speak in full sentences and does not appear short of breath  Cardiovascular: No lower  extremity edema, non tender, no erythema  Skin: Warm dry intact with no signs of infection or rash on extremities or on axial skeleton.  Abdomen: Soft nontender  Neuro: Cranial nerves II through XII are intact, neurovascularly intact in all extremities with 2+ DTRs and 2+ pulses.  Lymph: No lymphadenopathy of posterior or anterior cervical chain or axillae bilaterally.  Gait normal with good balance and coordination.  MSK:  Non tender with full range of motion and good stability and symmetric strength and tone of shoulders, elbows, wrist, hip, knee and ankles bilaterally.  Mild arthritic changes of multiple joints  Neck exam shows some loss of lordosis but does have some increasing kyphosis of the upper back.  Mild decreased range of motion in sidebending and rotation bilaterally right greater than left.  Tenderness to palpation over the right paraspinal musculature around the scapula as well.  Negative Spurling's.  Back pain Shows some degenerative scoliosis.  Patient does have some tightness.  Positive Faber bilaterally right greater than left.  Negative straight leg test.  4+ out of 5 strength but symmetric  Osteopathic findings C2 flexed rotated and side bent right T3 extended rotated and side bent right inhaled third rib T7 extended rotated and side bent left L2 flexed rotated and side bent right Sacrum right on right     Impression and Recommendations:     This case required medical decision making of moderate complexity.      Note: This dictation was prepared with Dragon dictation along with smaller phrase technology. Any transcriptional errors that result from this process are unintentional.

## 2018-05-05 ENCOUNTER — Encounter: Payer: Self-pay | Admitting: Family Medicine

## 2018-05-05 ENCOUNTER — Ambulatory Visit (INDEPENDENT_AMBULATORY_CARE_PROVIDER_SITE_OTHER): Payer: Medicare Other | Admitting: Family Medicine

## 2018-05-05 VITALS — BP 122/92 | HR 87 | Ht 66.0 in | Wt 192.0 lb

## 2018-05-05 DIAGNOSIS — M999 Biomechanical lesion, unspecified: Secondary | ICD-10-CM

## 2018-05-05 DIAGNOSIS — M5416 Radiculopathy, lumbar region: Secondary | ICD-10-CM

## 2018-05-05 NOTE — Assessment & Plan Note (Signed)
No radicular symptoms.  Does have some muscle imbalances.  Patient was making improvement.  Has responded very well to osteopathic manipulation as well as formal physical therapy.  Follow-up again in 6 to 8 weeks

## 2018-05-05 NOTE — Patient Instructions (Signed)
Good to see you  I am so happy you are making progress.  Stay active Lets see you after all the visitors Keep it up!!!

## 2018-05-05 NOTE — Assessment & Plan Note (Signed)
Decision today to treat with OMT was based on Physical Exam  After verbal consent patient was treated with HVLA, ME, FPR techniques in cervical, thoracic, rib, lumbar and sacral areas  Patient tolerated the procedure well with improvement in symptoms  Patient given exercises, stretches and lifestyle modifications  See medications in patient instructions if given  Patient will follow up in 6-8 weeks 

## 2018-05-06 ENCOUNTER — Ambulatory Visit (INDEPENDENT_AMBULATORY_CARE_PROVIDER_SITE_OTHER): Payer: Medicare Other | Admitting: Endocrinology

## 2018-05-06 ENCOUNTER — Encounter: Payer: Self-pay | Admitting: Endocrinology

## 2018-05-06 VITALS — BP 132/74 | HR 93 | Wt 190.8 lb

## 2018-05-06 DIAGNOSIS — Z794 Long term (current) use of insulin: Secondary | ICD-10-CM

## 2018-05-06 DIAGNOSIS — E1151 Type 2 diabetes mellitus with diabetic peripheral angiopathy without gangrene: Secondary | ICD-10-CM | POA: Diagnosis not present

## 2018-05-06 LAB — POCT GLYCOSYLATED HEMOGLOBIN (HGB A1C): Hemoglobin A1C: 7.3 % — AB (ref 4.0–5.6)

## 2018-05-06 NOTE — Patient Instructions (Addendum)
check your blood sugar twice a day.  vary the time of day when you check, between before the 3 meals, and at bedtime.  also check if you have symptoms of your blood sugar being too high or too low.  please keep a record of the readings and bring it to your next appointment here.  You can write it on any piece of paper.  please call us sooner if your blood sugar goes below 70, or if you have a lot of readings over 200.   continue the same medications for diabetes.    Please come back for a follow-up appointment in 4 months.

## 2018-05-06 NOTE — Progress Notes (Signed)
Subjective:    Patient ID: Christine [name], female    DOB: 1951-02-11, 67 y.o.   MRN: [id number]  HPI Pt returns for f/u of diabetes mellitus: DM type: Insulin-requiring type 2 Dx'ed: 7619 Complications: TIA Therapy: insulin since 2014, and pioglitizone.   GDM: never. DKA: never.  Severe hypoglycemia: never.  Pancreatitis: never.   Other: She declines multiple daily injections; pioglitizone is for NASH; a trial to convert back to oral rx in 2017 was unsuccessful.  Interval history: no recent steroids  cbg's vary from 104-196.  There is no trend throughout the day.  pt states she feels well in general.   Past Medical History:  Diagnosis Date  . Allergic rhinitis   . Diabetes mellitus   . History of recurrent UTIs    [name]  . HTN (hypertension)   . Hyperlipidemia   . Migraine headache    quiescent  . Sleep disorder    [name] May  . TIA (transient ischemic attack)     Past Surgical History:  Procedure Laterality Date  . COLONOSCOPY     Dr Carlean [name]; hemorrhoids  . CORONARY ANGIOPLASTY  1997   for chest pain- negative   . POLYPECTOMY  2002   benign, hyperplastic polyp; rectal bleeding 2008  . TONSILLECTOMY    . TOTAL ABDOMINAL HYSTERECTOMY  1983   BSO for endometriosis  . WISDOM TOOTH EXTRACTION      Social History   Socioeconomic History  . Marital status: Married    Spouse name: Not on file  . Number of children: Not on file  . Years of education: Not on file  . Highest education level: Not on file  Occupational History  . Occupation: Designer, television/film set: [employer]  Social Needs  . Financial resource strain: Not on file  . Food insecurity:    Worry: Not on file    Inability: Not on file  . Transportation needs:    Medical: Not on file    Non-medical: Not on file  Tobacco Use  . Smoking status: Never Smoker  . Smokeless tobacco: Never Used  Substance and Sexual Activity  . Alcohol use: No  . Drug use: No  . Sexual  activity: Not on file  Lifestyle  . Physical activity:    Days per week: Not on file    Minutes per session: Not on file  . Stress: Not on file  Relationships  . Social connections:    Talks on phone: Not on file    Gets together: Not on file    Attends religious service: Not on file    Active member of club or organization: Not on file    Attends meetings of clubs or organizations: Not on file    Relationship status: Not on file  . Intimate partner violence:    Fear of current or ex partner: Not on file    Emotionally abused: Not on file    Physically abused: Not on file    Forced sexual activity: Not on file  Other Topics Concern  . Not on file  Social History Narrative   Regular exercise- no     Current Outpatient Medications on File Prior to Visit  Medication Sig Dispense Refill  . aspirin 81 MG tablet Take 81 mg by mouth daily.      [name] atorvastatin (LIPITOR) 40 MG tablet TAKE 1 TABLET BY MOUTH AT 6 PM 90 tablet 3  . BD PEN NEEDLE NANO  U/F 32G X 4 MM MISC USE ONCE A DAY AS DIRECTED 100 each 3  . busPIRone (BUSPAR) 10 MG tablet Take 10 mg by mouth 2 (two) times daily.    . DULoxetine (CYMBALTA) 60 MG capsule Take 90 mg by mouth daily.   4  . Insulin Glargine (BASAGLAR KWIKPEN) 100 UNIT/ML SOPN Inject 0.22 mLs (22 Units total) into the skin every morning. 15 mL 2  . Lancets (ONETOUCH ULTRASOFT) lancets Check blood sugar once daily. Dx code: 250.00 100 each 12  . LORazepam (ATIVAN) 0.5 MG tablet Take 0.5 mg by mouth 2 (two) times daily as needed. For anxiety.    [name] losartan (COZAAR) 100 MG tablet Take 1 tablet (100 mg total) by mouth daily. 90 tablet 1  . metroNIDAZOLE (METROGEL) 0.75 % gel Apply 1 application topically 2 (two) times daily. 45 g 0  . naproxen (NAPROSYN) 500 MG tablet TAKE 1 TABLET (500 MG TOTAL) BY MOUTH 2 (TWO) TIMES DAILY WITH A MEAL. 180 tablet 1  . omeprazole (PRILOSEC) 20 MG capsule Take 1 capsule (20 mg total) by mouth daily. 30 capsule 2  . ONE TOUCH  ULTRA TEST test strip USE 1 STRIP TWICE DAILY DX CODE E11.65 200 each 3  . pioglitazone (ACTOS) 30 MG tablet TAKE 1 TABLET (30 MG TOTAL) BY MOUTH DAILY.**INS TO PAY 5/11** 90 tablet 2  . promethazine (PHENERGAN) 12.5 MG tablet Take 1 tablet (12.5 mg total) by mouth every 8 (eight) hours as needed for nausea or vomiting. 20 tablet 3  . tiZANidine (ZANAFLEX) 2 MG tablet TAKE 1 TABLET BY MOUTH EVERY 8 HOURS AS NEEDED FOR MUSCLE SPASMS 60 tablet 1  . traMADol (ULTRAM) 50 MG tablet Take 1 tablet (50 mg total) by mouth every 12 (twelve) hours. 60 tablet 0  . traZODone (DESYREL) 50 MG tablet Take 0.5-1 tablets (25-50 mg total) by mouth at bedtime as needed for sleep. 90 tablet 0  . Vitamin D, Ergocalciferol, (DRISDOL) 50000 units CAPS capsule TAKE ONE CAPSULE BY MOUTH EVERY 7 DAYS 12 capsule 0   No current facility-administered medications on file prior to visit.     Allergies  Allergen Reactions  . Vioxx [Rofecoxib] Other (See Comments)    Excess BP which caused TIA   . Prednisone Other (See Comments)    Mental status changes No associated rash or fever  . Colesevelam Other (See Comments)    Leg Pain    Family History  Problem Relation Age of Onset  . Colon cancer Maternal Aunt   . Diabetes Paternal Uncle   . Heart attack Father 20  . COPD Mother   . Lung cancer Brother        smoker  . Mental illness Unknown        niece, committed suicide.  [name] Heart attack Unknown        brother X 2; @ 60 & 22  . Stroke Neg Hx     BP 132/74   Pulse 93   Wt 190 lb 12.8 oz (86.5 kg)   LMP  (LMP Unknown)   SpO2 97%   BMI 30.80 kg/m    Review of Systems She denies hypoglycemia    Objective:   Physical Exam VITAL SIGNS:  See vs page GENERAL: no distress Pulses: dorsalis pedis intact bilat.   MSK: no deformity of the feet CV: no leg edema.   Skin:  no ulcer on the feet.  normal color and temp on the feet. Neuro: sensation is intact  to touch on the feet.    A1c=7.3%     Assessment &  Plan:  Insulin-requiring type 2 DM, with TIA: this is the best control this pt should aim for, given this regimen, which does match insulin to her changing needs throughout the day.    Patient Instructions  check your blood sugar twice a day.  vary the time of day when you check, between before the 3 meals, and at bedtime.  also check if you have symptoms of your blood sugar being too high or too low.  please keep a record of the readings and bring it to your next appointment here.  You can write it on any piece of paper.  please call us sooner if your blood sugar goes below 70, or if you have a lot of readings over 200.   continue the same medications for diabetes.    Please come back for a follow-up appointment in 4 months.

## 2018-05-18 ENCOUNTER — Encounter: Payer: Self-pay | Admitting: Endocrinology

## 2018-05-18 LAB — HM DIABETES EYE EXAM

## 2018-05-24 ENCOUNTER — Encounter: Payer: Self-pay | Admitting: Family Medicine

## 2018-05-24 ENCOUNTER — Other Ambulatory Visit: Payer: Self-pay | Admitting: Family Medicine

## 2018-06-06 ENCOUNTER — Ambulatory Visit: Payer: Medicare Other | Admitting: Endocrinology

## 2018-06-26 NOTE — Progress Notes (Signed)
[name] [place], [place] [address] Phone: [phone] Subjective:    I'm seeing this patient by the request  of:    CC: Back and neck pain  [id number]  Christine [name] is a 67 y.o. female coming in with complaint of back and neck pain.  Patient did have her nephew visit recently and was doing a lot more activity.  Feels like this is causing more tightness.  No radicular symptoms.  Known to have chronic pain syndrome but is doing relatively well.  Patient states that medications no side effects at the moment.     Past Medical History:  Diagnosis Date  . Allergic rhinitis   . Diabetes mellitus   . History of recurrent UTIs    [name]  . HTN (hypertension)   . Hyperlipidemia   . Migraine headache    quiescent  . Sleep disorder    [name] May  . TIA (transient ischemic attack)    Past Surgical History:  Procedure Laterality Date  . COLONOSCOPY     Dr Carlean [name]; hemorrhoids  . CORONARY ANGIOPLASTY  1997   for chest pain- negative   . POLYPECTOMY  2002   benign, hyperplastic polyp; rectal bleeding 2008  . TONSILLECTOMY    . TOTAL ABDOMINAL HYSTERECTOMY  1983   BSO for endometriosis  . WISDOM TOOTH EXTRACTION     Social History   Socioeconomic History  . Marital status: Married    Spouse name: Not on file  . Number of children: Not on file  . Years of education: Not on file  . Highest education level: Not on file  Occupational History  . 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 Financial resource strain: Not on file  . Food insecurity:    Worry: Not on file    Inability: Not on file  . Transportation needs:    Medical: Not on file    Non-medical: Not on file  Tobacco Use  . Smoking status: Never Smoker  . Smokeless tobacco: Never Used  Substance and Sexual Activity  . Alcohol use: No  . Drug use: No  . Sexual activity: Not on file  Lifestyle  . Physical  activity:    Days per week: Not on file    Minutes per session: Not on file  . Stress: Not on file  Relationships  . Social connections:    Talks on phone: Not on file    Gets together: Not on file    Attends religious service: Not on file    Active member of club or organization: Not on file    Attends meetings of clubs or organizations: Not on file    Relationship status: Not on file  Other Topics Concern  . Not on file  Social History Narrative   Regular exercise- no    Allergies  Allergen Reactions  . Vioxx [Rofecoxib] Other (See Comments)    Excess BP which caused TIA   . Prednisone Other (See Comments)    Mental status changes No associated rash or fever  . Colesevelam Other (See Comments)    Leg Pain   Family History  Problem Relation Age of Onset  . Colon cancer Maternal Aunt   . Diabetes Paternal Uncle   . Heart attack Father 21  . COPD Mother   . Lung cancer Brother        smoker  . Mental illness  Unknown        niece, committed suicide.  [name] Heart attack Unknown        brother X 2; @ 20 & 39  . Stroke Neg Hx      Past medical history, social, surgical and family history all reviewed in electronic medical record.  No pertanent information unless stated regarding to the chief complaint.   Review of Systems:Review of systems updated and as accurate as of 06/27/18  No headache, visual changes, nausea, vomiting, diarrhea, constipation, dizziness, abdominal pain, skin rash, fevers, chills, night sweats, weight loss, swollen lymph nodes,chest pain, shortness of breath, mood changes.  Positive muscle aches and body aches in a daily basis  Objective  Blood pressure 140/74, pulse 83, height 5\' 6"  (1.676 m), weight 188 lb (85.3 kg), SpO2 95 %. Systems examined below as of 06/27/18   General: No apparent distress alert and oriented x3 mood and affect normal, dressed appropriately.  HEENT: Pupils equal, extraocular movements intact  Respiratory: Patient's speak in  full sentences and does not appear short of breath  Cardiovascular: No lower extremity edema, non tender, no erythema  Skin: Warm dry intact with no signs of infection or rash on extremities or on axial skeleton.  Abdomen: Soft nontender  Neuro: Cranial nerves II through XII are intact, neurovascularly intact in all extremities with 2+ DTRs and 2+ pulses.  Lymph: No lymphadenopathy of posterior or anterior cervical chain or axillae bilaterally.  Gait normal with good balance and coordination.  MSK:  tender with full range of motion and good stability and symmetric strength and tone of shoulders, elbows, wrist, hip, knee and ankles bilaterally.  Mild to moderate arthritic changes of multiple joints Neck: Inspection mild loss of lordosis. No palpable stepoffs. Negative Spurling's maneuver. Full neck range of motion Grip strength and sensation normal in bilateral hands Strength good C4 to T1 distribution No sensory change to C4 to T1 Negative Hoffman sign bilaterally Reflexes normal Tightness of the trapezius bilaterally   Back exam shows some loss of lordosis and some mild degenerative scoliosis.  Tightness with Corky Sox test bilaterally but negative straight leg test.  Full symmetric strength of the lower extremities  Osteopathic findings C6 flexed rotated and side bent left T3 extended rotated and side bent right inhaled third rib T6 extended rotated and side bent left L3 flexed rotated and side bent right Sacrum right on right      Impression and Recommendations:     This case required medical decision making of moderate complexity.      Note: This dictation was prepared with Dragon dictation along with smaller phrase technology. Any transcriptional errors that result from this process are unintentional.

## 2018-06-27 ENCOUNTER — Encounter: Payer: Self-pay | Admitting: Family Medicine

## 2018-06-27 ENCOUNTER — Ambulatory Visit (INDEPENDENT_AMBULATORY_CARE_PROVIDER_SITE_OTHER): Payer: Medicare Other | Admitting: Family Medicine

## 2018-06-27 VITALS — BP 140/74 | HR 83 | Ht 66.0 in | Wt 188.0 lb

## 2018-06-27 DIAGNOSIS — M999 Biomechanical lesion, unspecified: Secondary | ICD-10-CM | POA: Insufficient documentation

## 2018-06-27 DIAGNOSIS — M5416 Radiculopathy, lumbar region: Secondary | ICD-10-CM

## 2018-06-27 HISTORY — DX: Biomechanical lesion, unspecified: M99.9

## 2018-06-27 NOTE — Assessment & Plan Note (Signed)
Decision today to treat with OMT was based on Physical Exam  After verbal consent patient was treated with HVLA, ME, FPR techniques in cervical, thoracic, rib, lumbar and sacral areas  Patient tolerated the procedure well with improvement in symptoms  Patient given exercises, stretches and lifestyle modifications  See medications in patient instructions if given  Patient will follow up in 4-6 weeks 

## 2018-06-27 NOTE — Assessment & Plan Note (Signed)
Stable at the moment.  No radicular symptoms.  Continue the same medications.  Seems to be doing well with the duloxetine.  We discussed icing regimen, home exercise, topical anti-inflammatories.  Responded well to the manipulation.  Follow-up again in 4 to 6 weeks

## 2018-06-27 NOTE — Patient Instructions (Signed)
Good to see you  Christine [name] is your friend.  Stay active.  I am impressed and hope you feel better See me again in 5-6 weeks

## 2018-06-28 ENCOUNTER — Other Ambulatory Visit: Payer: Self-pay | Admitting: Family Medicine

## 2018-07-04 ENCOUNTER — Other Ambulatory Visit: Payer: Self-pay | Admitting: Internal Medicine

## 2018-07-11 ENCOUNTER — Other Ambulatory Visit: Payer: Self-pay | Admitting: Family Medicine

## 2018-07-18 DIAGNOSIS — F4321 Adjustment disorder with depressed mood: Secondary | ICD-10-CM | POA: Diagnosis not present

## 2018-08-08 NOTE — Progress Notes (Signed)
[name] [place], [place] [address] Phone: [phone] Subjective:   Christine [name], am serving as a scribe for Dr. Hulan Saas.    CC: Back pain and neck pain follow-up  Christine [name]  Christine [name] is a 67 y.o. female coming in with complaint of back pain. Both hips have burning pain constantly but it is better than is used to be. She sat all day yesterday but did not have a lot of pain with being seated. Has responded well to manipulation previously.  Patient is in some aching.  She has had a little bit of a flare of her depression as well as fibromyalgia.    Past Medical History:  Diagnosis Date  . Allergic rhinitis   . Diabetes mellitus   . History of recurrent UTIs    [name]  . HTN (hypertension)   . Hyperlipidemia   . Migraine headache    quiescent  . Sleep disorder    [name] May  . TIA (transient ischemic attack)    Past Surgical History:  Procedure Laterality Date  . COLONOSCOPY     Dr Carlean [name]; hemorrhoids  . CORONARY ANGIOPLASTY  1997   for chest pain- negative   . POLYPECTOMY  2002   benign, hyperplastic polyp; rectal bleeding 2008  . TONSILLECTOMY    . TOTAL ABDOMINAL HYSTERECTOMY  1983   BSO for endometriosis  . WISDOM TOOTH EXTRACTION     Social History   Socioeconomic History  . Marital status: Married    Spouse name: Not on file  . Number of children: Not on file  . Years of education: Not on file  . Highest education level: Not on file  Occupational History  . 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 Financial resource strain: Not on file  . Food insecurity:    Worry: Not on file    Inability: Not on file  . Transportation needs:    Medical: Not on file    Non-medical: Not on file  Tobacco Use  . Smoking status: Never Smoker  . Smokeless tobacco: Never Used  Substance and Sexual Activity  . Alcohol use: [name]  . Drug use: [name]  .  Sexual activity: Not on file  Lifestyle  . Physical activity:    Days per week: Not on file    Minutes per session: Not on file  . Stress: Not on file  Relationships  . Social connections:    Talks on phone: Not on file    Gets together: Not on file    Attends religious service: Not on file    Active member of club or organization: Not on file    Attends meetings of clubs or organizations: Not on file    Relationship status: Not on file  Other Topics Concern  . Not on file  Social History Narrative   Regular exercise- [name]    Allergies  Allergen Reactions  . Vioxx [Rofecoxib] Other (See Comments)    Excess BP which caused TIA   . Prednisone Other (See Comments)    Mental status changes [name] associated rash or fever  . Colesevelam Other (See Comments)    Leg Pain   Family History  Problem Relation Age of Onset  . Colon cancer Maternal Aunt   . Diabetes Paternal Uncle   . Heart attack Father 69  . COPD Mother   . Lung cancer Brother  smoker  . Mental illness Unknown        niece, committed suicide.  [name] Heart attack Unknown        brother X 2; @ 84 & 6  . Stroke Neg Hx     Current Outpatient Medications (Endocrine & Metabolic):  [name]  Insulin Glargine (BASAGLAR KWIKPEN) 100 UNIT/ML SOPN, Inject 0.22 mLs (22 Units total) into the skin every morning. .  pioglitazone (ACTOS) 30 MG tablet, TAKE 1 TABLET (30 MG TOTAL) BY MOUTH DAILY.**INS TO PAY 5/11**  Current Outpatient Medications (Cardiovascular):  .  atorvastatin (LIPITOR) 40 MG tablet, TAKE 1 TABLET BY MOUTH AT 6 PM .  losartan (COZAAR) 100 MG tablet, TAKE 1 TABLET BY MOUTH EVERY DAY  Current Outpatient Medications (Respiratory):  .  promethazine (PHENERGAN) 12.5 MG tablet, Take 1 tablet (12.5 mg total) by mouth every 8 (eight) hours as needed for nausea or vomiting.  Current Outpatient Medications (Analgesics):  .  aspirin 81 MG tablet, Take 81 mg by mouth daily.   .  naproxen (NAPROSYN) 500 MG tablet, TAKE 1  TABLET (500 MG TOTAL) BY MOUTH 2 (TWO) TIMES DAILY WITH A MEAL. [name]  traMADol (ULTRAM) 50 MG tablet, TAKE 1 TABLET (50 MG TOTAL) BY MOUTH EVERY 12 (TWELVE) HOURS.   Current Outpatient Medications (Other):  [name]  BD PEN NEEDLE NANO U/F 32G X 4 MM MISC, USE ONCE A DAY AS DIRECTED .  busPIRone (BUSPAR) 10 MG tablet, Take 10 mg by mouth 2 (two) times daily. .  DULoxetine (CYMBALTA) 60 MG capsule, Take 90 mg by mouth daily.  .  Lancets (ONETOUCH ULTRASOFT) lancets, Check blood sugar once daily. Dx code: 250.00 .  LORazepam (ATIVAN) 0.5 MG tablet, Take 0.5 mg by mouth 2 (two) times daily as needed. For anxiety. .  metroNIDAZOLE (METROGEL) 0.75 % gel, Apply 1 application topically 2 (two) times daily. [name]  omeprazole (PRILOSEC) 20 MG capsule, TAKE 1 CAPSULE BY MOUTH EVERY DAY .  ONE TOUCH ULTRA TEST test strip, USE 1 STRIP TWICE DAILY DX CODE E11.65 .  tiZANidine (ZANAFLEX) 2 MG tablet, TAKE 1 TABLET BY MOUTH EVERY 8 HOURS AS NEEDED FOR MUSCLE SPASMS .  traZODone (DESYREL) 50 MG tablet, TAKE 0.5-1 TABLETS (25-50 MG TOTAL) BY MOUTH AT BEDTIME AS NEEDED FOR SLEEP. .  Vitamin D, Ergocalciferol, (DRISDOL) 50000 units CAPS capsule, TAKE ONE CAPSULE BY MOUTH EVERY 7 DAYS    Past medical history, social, surgical and family history all reviewed in electronic medical record.  [name] pertanent information unless stated regarding to the chief complaint.   Review of Systems:  [name] headache, visual changes, nausea, vomiting, diarrhea, constipation, dizziness, abdominal pain, skin rash, fevers, chills, night sweats, weight loss, swollen lymph nodes, body aches, joint swelling,  chest pain, shortness of breath, mood changes.  Positive muscle aches  Objective  Blood pressure 132/80, pulse 85, height 5\' 6"  (1.676 m), weight 189 lb (85.7 kg), SpO2 98 %.    General: [name] apparent distress alert and oriented x3 mood and affect normal, dressed appropriately.  HEENT: Pupils equal, extraocular movements intact  Respiratory:  Patient's speak in full sentences and does not appear short of breath  Cardiovascular: [name] lower extremity edema, non tender, [name] erythema  Skin: Warm dry intact with [name] signs of infection or rash on extremities or on axial skeleton.  Abdomen: Soft nontender  Neuro: Cranial nerves II through XII are intact, neurovascularly intact in all extremities with 2+ DTRs and 2+ pulses.  Lymph: [name] lymphadenopathy of  posterior or anterior cervical chain or axillae bilaterally.  Gait normal with good balance and coordination.  MSK:  tender with full range of motion and good stability and symmetric strength and tone of shoulders, elbows, wrist, hip, knee and ankles bilaterally.  Back exam has some loss of lordosis.  Some tightness in the paraspinal musculature lumbar spine bilaterally.  Tenderness over the sacroiliac joints bilaterally.  Positive Faber on the right side.  Neurovascularly intact distally.  Osteopathic findings C2 flexed rotated and side bent right T3 extended rotated and side bent right inhaled third rib T9 extended rotated and side bent left L2 flexed rotated and side bent right Sacrum right on right     Impression and Recommendations:     This case required medical decision making of moderate complexity. The above documentation has been reviewed and is accurate and complete Lyndal Pulley, DO         Note: This dictation was prepared with Dragon dictation along with smaller phrase technology. Any transcriptional errors that result from this process are unintentional.

## 2018-08-09 ENCOUNTER — Encounter: Payer: Self-pay | Admitting: Family Medicine

## 2018-08-09 ENCOUNTER — Ambulatory Visit (INDEPENDENT_AMBULATORY_CARE_PROVIDER_SITE_OTHER): Payer: Medicare Other | Admitting: Family Medicine

## 2018-08-09 VITALS — BP 132/80 | HR 85 | Ht 66.0 in | Wt 189.0 lb

## 2018-08-09 DIAGNOSIS — M7601 Gluteal tendinitis, right hip: Secondary | ICD-10-CM | POA: Diagnosis not present

## 2018-08-09 DIAGNOSIS — M999 Biomechanical lesion, unspecified: Secondary | ICD-10-CM | POA: Diagnosis not present

## 2018-08-09 NOTE — Patient Instructions (Signed)
Good to see you  You should be doing well soon  Exercises 3 times a week.  Keep hands within peripheral vision  Arnica lotion for the bruise could help  See me again in 4 weeks to make sure shoulder is better

## 2018-08-09 NOTE — Assessment & Plan Note (Signed)
Decision today to treat with OMT was based on Physical Exam  After verbal consent patient was treated with HVLA, ME, FPR techniques in cervical, thoracic, rib,  lumbar and sacral areas  Patient tolerated the procedure well with improvement in symptoms  Patient given exercises, stretches and lifestyle modifications  See medications in patient instructions if given  Patient will follow up in 4-8 weeks 

## 2018-08-09 NOTE — Assessment & Plan Note (Signed)
Pain seems to be more over the gluteal tendon again.  Patient declined any type of injection.  Tolerated the osteopathic manipulation well.  Discussed posture and ergonomics.  Discussed icing regimen.  Follow-up again in 4 to 8 weeks

## 2018-08-27 ENCOUNTER — Other Ambulatory Visit: Payer: Self-pay | Admitting: Endocrinology

## 2018-08-29 ENCOUNTER — Ambulatory Visit: Payer: Self-pay

## 2018-08-29 ENCOUNTER — Encounter: Payer: Self-pay | Admitting: Family Medicine

## 2018-08-29 ENCOUNTER — Ambulatory Visit (INDEPENDENT_AMBULATORY_CARE_PROVIDER_SITE_OTHER): Payer: Medicare Other | Admitting: Family Medicine

## 2018-08-29 VITALS — BP 138/90 | HR 84 | Ht 66.0 in | Wt 189.0 lb

## 2018-08-29 DIAGNOSIS — M25511 Pain in right shoulder: Secondary | ICD-10-CM

## 2018-08-29 DIAGNOSIS — M7551 Bursitis of right shoulder: Secondary | ICD-10-CM | POA: Insufficient documentation

## 2018-08-29 HISTORY — DX: Bursitis of right shoulder: M75.51

## 2018-08-29 NOTE — Assessment & Plan Note (Signed)
Calcific changes of the right shoulder.  Discussed icing regimen and home exercise.  Discussed which activities to do which wants to avoid.  Increase activity as tolerated.  Follow-up again in 4 to 6 weeks

## 2018-08-29 NOTE — Progress Notes (Signed)
[name] [address], [place] [address] Phone: [phone] Subjective:   [name], am serving as a scribe for Dr. Hulan Saas.   CC: Right shoulder pain  [id number]  Christine [name] is a 67 y.o. female coming in with complaint of right shoulder pain. Had two different incidences which she feels is contributing to her pain. One month ago she hit her shoulder on the bed frame when trying to clean under the bed. She also tried grabbing her door at her home to prevent from falling and her arm went into shoulder extension. She has been having pain with IR since then. Has sharp catching pain and popping. Pain in the anterior and posterior shoulder. Feels tension from the tricep up into her cervical spine..      Past Medical History:  Diagnosis Date  . Allergic rhinitis   . Diabetes mellitus   . History of recurrent UTIs    [name]  . HTN (hypertension)   . Hyperlipidemia   . Migraine headache    quiescent  . Sleep disorder    [name] May  . TIA (transient ischemic attack)    Past Surgical History:  Procedure Laterality Date  . COLONOSCOPY     Dr Carlean [name]; hemorrhoids  . CORONARY ANGIOPLASTY  1997   for chest pain- negative   . POLYPECTOMY  2002   benign, hyperplastic polyp; rectal bleeding 2008  . TONSILLECTOMY    . TOTAL ABDOMINAL HYSTERECTOMY  1983   BSO for endometriosis  . WISDOM TOOTH EXTRACTION     Social History   Socioeconomic History  . Marital status: Married    Spouse name: Not on file  . Number of children: Not on file  . Years of education: Not on file  . Highest education level: Not on file  Occupational History  . Occupation: Designer, television/film set: [employer]  Social Needs  . Financial resource strain: Not on file  . Food insecurity:    Worry: Not on file    Inability: Not on file  . Transportation needs:    Medical: Not on file    Non-medical: Not on file  Tobacco  Use  . Smoking status: Never Smoker  . Smokeless tobacco: Never Used  Substance and Sexual Activity  . Alcohol use: No  . Drug use: No  . Sexual activity: Not on file  Lifestyle  . Physical activity:    Days per week: Not on file    Minutes per session: Not on file  . Stress: Not on file  Relationships  . Social connections:    Talks on phone: Not on file    Gets together: Not on file    Attends religious service: Not on file    Active member of club or organization: Not on file    Attends meetings of clubs or organizations: Not on file    Relationship status: Not on file  Other Topics Concern  . Not on file  Social History Narrative   Regular exercise- no    Allergies  Allergen Reactions  . Vioxx [Rofecoxib] Other (See Comments)    Excess BP which caused TIA   . Prednisone Other (See Comments)    Mental status changes No associated rash or fever  . Colesevelam Other (See Comments)    Leg Pain   Family History  Problem Relation Age of Onset  . Colon cancer Maternal Aunt   .  Diabetes Paternal Uncle   . Heart attack Father 55  . COPD Mother   . Lung cancer Brother        smoker  . Mental illness Unknown        niece, committed suicide.  [name] Heart attack Unknown        brother X 2; @ 32 & 53  . Stroke Neg Hx     Current Outpatient Medications (Endocrine & Metabolic):  [name]  Insulin Glargine (BASAGLAR KWIKPEN) 100 UNIT/ML SOPN, Inject 0.22 mLs (22 Units total) into the skin every morning. .  pioglitazone (ACTOS) 30 MG tablet, TAKE 1 TABLET (30 MG TOTAL) BY MOUTH DAILY.**INS TO PAY 5/11**  Current Outpatient Medications (Cardiovascular):  .  atorvastatin (LIPITOR) 40 MG tablet, TAKE 1 TABLET BY MOUTH AT 6 PM .  losartan (COZAAR) 100 MG tablet, TAKE 1 TABLET BY MOUTH EVERY DAY  Current Outpatient Medications (Respiratory):  .  promethazine (PHENERGAN) 12.5 MG tablet, Take 1 tablet (12.5 mg total) by mouth every 8 (eight) hours as needed for nausea or  vomiting.  Current Outpatient Medications (Analgesics):  .  aspirin 81 MG tablet, Take 81 mg by mouth daily.   .  naproxen (NAPROSYN) 500 MG tablet, TAKE 1 TABLET (500 MG TOTAL) BY MOUTH 2 (TWO) TIMES DAILY WITH A MEAL. [name]  traMADol (ULTRAM) 50 MG tablet, TAKE 1 TABLET (50 MG TOTAL) BY MOUTH EVERY 12 (TWELVE) HOURS.   Current Outpatient Medications (Other):  [name]  BD PEN NEEDLE NANO U/F 32G X 4 MM MISC, USE ONCE A DAY AS DIRECTED .  busPIRone (BUSPAR) 10 MG tablet, Take 10 mg by mouth 2 (two) times daily. .  DULoxetine (CYMBALTA) 60 MG capsule, Take 90 mg by mouth daily.  .  Lancets (ONETOUCH ULTRASOFT) lancets, Check blood sugar once daily. Dx code: 250.00 .  LORazepam (ATIVAN) 0.5 MG tablet, Take 0.5 mg by mouth 2 (two) times daily as needed. For anxiety. .  metroNIDAZOLE (METROGEL) 0.75 % gel, Apply 1 application topically 2 (two) times daily. [name]  omeprazole (PRILOSEC) 20 MG capsule, TAKE 1 CAPSULE BY MOUTH EVERY DAY .  ONE TOUCH ULTRA TEST test strip, USE 1 STRIP TWICE DAILY DX CODE E11.65 .  tiZANidine (ZANAFLEX) 2 MG tablet, TAKE 1 TABLET BY MOUTH EVERY 8 HOURS AS NEEDED FOR MUSCLE SPASMS .  traZODone (DESYREL) 50 MG tablet, TAKE 0.5-1 TABLETS (25-50 MG TOTAL) BY MOUTH AT BEDTIME AS NEEDED FOR SLEEP. .  Vitamin D, Ergocalciferol, (DRISDOL) 50000 units CAPS capsule, TAKE ONE CAPSULE BY MOUTH EVERY 7 DAYS    Past medical history, social, surgical and family history all reviewed in electronic medical record.  No pertanent information unless stated regarding to the chief complaint.   Review of Systems:  No headache, visual changes, nausea, vomiting, diarrhea, constipation, dizziness, abdominal pain, skin rash, fevers, chills, night sweats, weight loss, swollen lymph nodes, body aches, joint swelling, , chest pain, shortness of breath, mood changes.  Positive muscle aches  Objective  Blood pressure 138/90, pulse 84, height 5\' 6"  (1.676 m), weight 189 lb (85.7 kg), SpO2 98 %.    General:  No apparent distress alert and oriented x3 mood and affect normal, dressed appropriately.  HEENT: Pupils equal, extraocular movements intact  Respiratory: Patient's speak in full sentences and does not appear short of breath  Cardiovascular: No lower extremity edema, non tender, no erythema  Skin: Warm dry intact with no signs of infection or rash on extremities or on axial skeleton.  Abdomen:  Soft nontender  Neuro: Cranial nerves II through XII are intact, neurovascularly intact in all extremities with 2+ DTRs and 2+ pulses.  Lymph: No lymphadenopathy of posterior or anterior cervical chain or axillae bilaterally.  Gait normal with good balance and coordination.  MSK:  tender with full range of motion and good stability and symmetric strength and tone of , elbows, wrist, hip, knee and ankles bilaterally.  Shoulder: Right Inspection reveals no abnormalities, atrophy or asymmetry. Palpation is normal with no tenderness over AC joint or bicipital groove. ROM is full in all planes passively. Rotator cuff strength normal throughout. signs of impingement with positive Neer and Hawkin's tests, but negative empty can sign. Speeds and Yergason's tests normal. No labral pathology noted with negative Obrien's, negative clunk and good stability. Normal scapular function observed. No painful arc and no drop arm sign. No apprehension sign  MSK US performed of: Right This study was ordered, performed, and interpreted by Charlann Boxer D.O.  Shoulder:   Supraspinatus:  Appears normal on long and transverse views, Bursal bulge seen with shoulder abduction on impingement view. Infraspinatus:  Appears normal on long and transverse views. Significant increase in Doppler flow Subscapularis:  Appears normal on long and transverse views. Positive bursa Teres Minor:  Appears normal on long and transverse views. AC joint:  Capsule undistended, no geyser sign. Glenohumeral Joint:  Appears normal without  effusion. Glenoid Labrum:  Intact without visualized tears. Biceps Tendon:  Appears normal on long and transverse views, no fraying of tendon, tendon located in intertubercular groove, no subluxation with shoulder internal or external rotation.  Impression: Subacromial bursitis  Procedure: Real-time Ultrasound Guided Injection of right glenohumeral joint Device: GE Logiq E  Ultrasound guided injection is preferred based studies that show increased duration, increased effect, greater accuracy, decreased procedural pain, increased response rate with ultrasound guided versus blind injection.  Verbal informed consent obtained.  Time-out conducted.  Noted no overlying erythema, induration, or other signs of local infection.  Skin prepped in a sterile fashion.  Local anesthesia: Topical Ethyl chloride.  With sterile technique and under real time ultrasound guidance:  Joint visualized.  23g 1  inch needle inserted posterior approach. Pictures taken for needle placement. Patient did have injection of 2 cc of 1% lidocaine, 2 cc of 0.5% Marcaine, and 1.0 cc of Kenalog 40 mg/dL. Completed without difficulty  Pain immediately resolved suggesting accurate placement of the medication.  Advised to call if fevers/chills, erythema, induration, drainage, or persistent bleeding.  Images permanently stored and available for review in the ultrasound unit.  Impression: Technically successful ultrasound guided injection.   Impression and Recommendations:     This case required medical decision making of moderate complexity. The above documentation has been reviewed and is accurate and complete Lyndal Pulley, DO       Note: This dictation was prepared with Dragon dictation along with smaller phrase technology. Any transcriptional errors that result from this process are unintentional.

## 2018-08-29 NOTE — Patient Instructions (Signed)
Good to see you  Ice is your friend Exercises 3 times a week.  Keep hands within peripheral vison  Injected the shoulder Maybe see you next week or if doing great then move the appointment back

## 2018-09-06 ENCOUNTER — Ambulatory Visit: Payer: Medicare Other | Admitting: Family Medicine

## 2018-09-08 ENCOUNTER — Encounter: Payer: Self-pay | Admitting: Endocrinology

## 2018-09-08 ENCOUNTER — Ambulatory Visit (INDEPENDENT_AMBULATORY_CARE_PROVIDER_SITE_OTHER): Payer: Medicare Other | Admitting: Endocrinology

## 2018-09-08 VITALS — BP 128/80 | HR 88 | Ht 66.0 in | Wt 187.0 lb

## 2018-09-08 DIAGNOSIS — E1151 Type 2 diabetes mellitus with diabetic peripheral angiopathy without gangrene: Secondary | ICD-10-CM

## 2018-09-08 DIAGNOSIS — Z23 Encounter for immunization: Secondary | ICD-10-CM | POA: Diagnosis not present

## 2018-09-08 DIAGNOSIS — Z794 Long term (current) use of insulin: Secondary | ICD-10-CM

## 2018-09-08 LAB — POCT GLYCOSYLATED HEMOGLOBIN (HGB A1C): Hemoglobin A1C: 6.9 % — AB (ref 4.0–5.6)

## 2018-09-08 MED ORDER — BASAGLAR KWIKPEN 100 UNIT/ML ~~LOC~~ SOPN: 24.0000 [IU] | PEN_INJECTOR | SUBCUTANEOUS | 2 refills | Status: DC

## 2018-09-08 NOTE — Progress Notes (Signed)
Subjective:    Patient ID: Christine [name], female    DOB: 05-14-1951, 67 y.o.   MRN: [id number]  HPI Pt returns for f/u of diabetes mellitus: DM type: Insulin-requiring type 2 Dx'ed: 8850 Complications: TIA Therapy: insulin since 2014, and pioglitizone.   GDM: never. DKA: never.  Severe hypoglycemia: never.  Pancreatitis: never.   Other: She declines multiple daily injections; pioglitizone is for NASH; a trial to convert back to oral rx in 2017 was unsuccessful.  Interval history: she had another steroid shot last week, into the right shoulder.  She says cbg's are well-controlled.  There is no trend throughout the day.  pt states she feels well in general.  She takes 25 units qd.   Past Medical History:  Diagnosis Date  . Allergic rhinitis   . Diabetes mellitus   . History of recurrent UTIs    [name]  . HTN (hypertension)   . Hyperlipidemia   . Migraine headache    quiescent  . Sleep disorder    [name] May  . TIA (transient ischemic attack)     Past Surgical History:  Procedure Laterality Date  . COLONOSCOPY     Dr Carlean [name]; hemorrhoids  . CORONARY ANGIOPLASTY  1997   for chest pain- negative   . POLYPECTOMY  2002   benign, hyperplastic polyp; rectal bleeding 2008  . TONSILLECTOMY    . TOTAL ABDOMINAL HYSTERECTOMY  1983   BSO for endometriosis  . WISDOM TOOTH EXTRACTION      Social History   Socioeconomic History  . Marital status: Married    Spouse name: Not on file  . Number of children: Not on file  . Years of education: Not on file  . Highest education level: Not on file  Occupational History  . Occupation: Designer, television/film set: [employer]  Social Needs  . Financial resource strain: Not on file  . Food insecurity:    Worry: Not on file    Inability: Not on file  . Transportation needs:    Medical: Not on file    Non-medical: Not on file  Tobacco Use  . Smoking status: Never Smoker  . Smokeless tobacco: Never Used   Substance and Sexual Activity  . Alcohol use: No  . Drug use: No  . Sexual activity: Not on file  Lifestyle  . Physical activity:    Days per week: Not on file    Minutes per session: Not on file  . Stress: Not on file  Relationships  . Social connections:    Talks on phone: Not on file    Gets together: Not on file    Attends religious service: Not on file    Active member of club or organization: Not on file    Attends meetings of clubs or organizations: Not on file    Relationship status: Not on file  . Intimate partner violence:    Fear of current or ex partner: Not on file    Emotionally abused: Not on file    Physically abused: Not on file    Forced sexual activity: Not on file  Other Topics Concern  . Not on file  Social History Narrative   Regular exercise- no     Current Outpatient Medications on File Prior to Visit  Medication Sig Dispense Refill  . aspirin 81 MG tablet Take 81 mg by mouth daily.      [name] atorvastatin (LIPITOR) 40 MG tablet TAKE 1  TABLET BY MOUTH AT 6 PM 90 tablet 3  . BD PEN NEEDLE NANO U/F 32G X 4 MM MISC USE ONCE A DAY AS DIRECTED 100 each 3  . busPIRone (BUSPAR) 10 MG tablet Take 10 mg by mouth 2 (two) times daily.    . DULoxetine (CYMBALTA) 60 MG capsule Take 90 mg by mouth daily.   4  . Lancets (ONETOUCH ULTRASOFT) lancets Check blood sugar once daily. Dx code: 250.00 100 each 12  . LORazepam (ATIVAN) 0.5 MG tablet Take 0.5 mg by mouth 2 (two) times daily as needed. For anxiety.    [name] losartan (COZAAR) 100 MG tablet TAKE 1 TABLET BY MOUTH EVERY DAY 90 tablet 1  . metroNIDAZOLE (METROGEL) 0.75 % gel Apply 1 application topically 2 (two) times daily. 45 g 0  . naproxen (NAPROSYN) 500 MG tablet TAKE 1 TABLET (500 MG TOTAL) BY MOUTH 2 (TWO) TIMES DAILY WITH A MEAL. 180 tablet 1  . omeprazole (PRILOSEC) 20 MG capsule TAKE 1 CAPSULE BY MOUTH EVERY DAY 90 capsule 1  . ONE TOUCH ULTRA TEST test strip USE 1 STRIP TWICE DAILY DX CODE E11.65 100 each 7    . pioglitazone (ACTOS) 30 MG tablet TAKE 1 TABLET (30 MG TOTAL) BY MOUTH DAILY.**INS TO PAY 5/11** 90 tablet 2  . promethazine (PHENERGAN) 12.5 MG tablet Take 1 tablet (12.5 mg total) by mouth every 8 (eight) hours as needed for nausea or vomiting. 20 tablet 3  . tiZANidine (ZANAFLEX) 2 MG tablet TAKE 1 TABLET BY MOUTH EVERY 8 HOURS AS NEEDED FOR MUSCLE SPASMS 60 tablet 1  . traMADol (ULTRAM) 50 MG tablet TAKE 1 TABLET (50 MG TOTAL) BY MOUTH EVERY 12 (TWELVE) HOURS. 60 tablet 0  . traZODone (DESYREL) 50 MG tablet TAKE 0.5-1 TABLETS (25-50 MG TOTAL) BY MOUTH AT BEDTIME AS NEEDED FOR SLEEP. 90 tablet 0  . Vitamin D, Ergocalciferol, (DRISDOL) 50000 units CAPS capsule TAKE ONE CAPSULE BY MOUTH EVERY 7 DAYS 12 capsule 0   No current facility-administered medications on file prior to visit.     Allergies  Allergen Reactions  . Vioxx [Rofecoxib] Other (See Comments)    Excess BP which caused TIA   . Prednisone Other (See Comments)    Mental status changes No associated rash or fever  . Colesevelam Other (See Comments)    Leg Pain    Family History  Problem Relation Age of Onset  . Colon cancer Maternal Aunt   . Diabetes Paternal Uncle   . Heart attack Father 88  . COPD Mother   . Lung cancer Brother        smoker  . Mental illness Unknown        niece, committed suicide.  [name] Heart attack Unknown        brother X 2; @ 31 & 40  . Stroke Neg Hx     BP 128/80 (BP Location: Right Arm, Patient Position: Sitting)   Pulse 88   Ht 5\' 6"  (1.676 m)   Wt 187 lb (84.8 kg)   LMP  (LMP Unknown)   SpO2 97%   BMI 30.18 kg/m    Review of Systems She denies hypoglycemia.      Objective:   Physical Exam VITAL SIGNS:  See vs page GENERAL: no distress. Pulses: foot pulses are intact bilaterally.   MSK: no deformity of the feet or ankles.  CV: no edema of the legs or ankles Skin:  no ulcer on the feet or ankles.  normal color and temp on the feet and ankles Neuro: sensation is intact to  touch on the feet and ankles.      Lab Results  Component Value Date   HGBA1C 6.9 (A) 09/08/2018       Assessment & Plan:  Insulin-requiring type 2 DM: overcontrolled, given this regimen, which does match insulin to her changing needs throughout the day  Patient Instructions  check your blood sugar twice a day.  vary the time of day when you check, between before the 3 meals, and at bedtime.  also check if you have symptoms of your blood sugar being too high or too low.  please keep a record of the readings and bring it to your next appointment here.  You can write it on any piece of paper.  please call us sooner if your blood sugar goes below 70, or if you have a lot of readings over 200.   Please reduce the basalgar to 24 units each morning.   Please come back for a follow-up appointment in 4 months.

## 2018-09-08 NOTE — Patient Instructions (Addendum)
check your blood sugar twice a day.  vary the time of day when you check, between before the 3 meals, and at bedtime.  also check if you have symptoms of your blood sugar being too high or too low.  please keep a record of the readings and bring it to your next appointment here.  You can write it on any piece of paper.  please call us sooner if your blood sugar goes below 70, or if you have a lot of readings over 200.   Please reduce the basalgar to 24 units each morning.   Please come back for a follow-up appointment in 4 months.

## 2018-09-25 ENCOUNTER — Other Ambulatory Visit: Payer: Self-pay | Admitting: Family Medicine

## 2018-09-26 ENCOUNTER — Encounter: Payer: Self-pay | Admitting: Family Medicine

## 2018-09-26 ENCOUNTER — Ambulatory Visit (INDEPENDENT_AMBULATORY_CARE_PROVIDER_SITE_OTHER): Payer: Medicare Other | Admitting: Family Medicine

## 2018-09-26 VITALS — BP 116/70 | HR 90 | Ht 66.0 in | Wt 187.0 lb

## 2018-09-26 DIAGNOSIS — M999 Biomechanical lesion, unspecified: Secondary | ICD-10-CM

## 2018-09-26 DIAGNOSIS — M7551 Bursitis of right shoulder: Secondary | ICD-10-CM

## 2018-09-26 NOTE — Progress Notes (Signed)
[name] [place], [place] [address] Phone: [phone] Subjective:    I Christine [name] am serving as a Education administrator for Dr. Hulan Saas.   CC: Right shoulder pain, neck pain follow-up  [id number]  Christine [name] is a 67 y.o. female coming in with complaint of right shoulder pain.  And neck pain.  Patient has been seen previously. Patient has responded well to manipulation previously.  Given an injection of the shoulder.  States that it is 95% better.  Still some mild discomfort when laying on it wrong but nothing severe.       Past Medical History:  Diagnosis Date  . Allergic rhinitis   . Diabetes mellitus   . History of recurrent UTIs    [name]  . HTN (hypertension)   . Hyperlipidemia   . Migraine headache    quiescent  . Sleep disorder    [name] May  . TIA (transient ischemic attack)    Past Surgical History:  Procedure Laterality Date  . COLONOSCOPY     Dr Carlean [name]; hemorrhoids  . CORONARY ANGIOPLASTY  1997   for chest pain- negative   . POLYPECTOMY  2002   benign, hyperplastic polyp; rectal bleeding 2008  . TONSILLECTOMY    . TOTAL ABDOMINAL HYSTERECTOMY  1983   BSO for endometriosis  . WISDOM TOOTH EXTRACTION     Social History   Socioeconomic History  . Marital status: Married    Spouse name: Not on file  . Number of children: Not on file  . Years of education: Not on file  . Highest education level: Not on file  Occupational History  . Occupation: Designer, television/film set: [employer]  Social Needs  . Financial resource strain: Not on file  . Food insecurity:    Worry: Not on file    Inability: Not on file  . Transportation needs:    Medical: Not on file    Non-medical: Not on file  Tobacco Use  . Smoking status: Never Smoker  . Smokeless tobacco: Never Used  Substance and Sexual Activity  . Alcohol use: No  . Drug use: No  . Sexual activity: Not on file  Lifestyle    . Physical activity:    Days per week: Not on file    Minutes per session: Not on file  . Stress: Not on file  Relationships  . Social connections:    Talks on phone: Not on file    Gets together: Not on file    Attends religious service: Not on file    Active member of club or organization: Not on file    Attends meetings of clubs or organizations: Not on file    Relationship status: Not on file  Other Topics Concern  . Not on file  Social History Narrative   Regular exercise- no    Allergies  Allergen Reactions  . Vioxx [Rofecoxib] Other (See Comments)    Excess BP which caused TIA   . Prednisone Other (See Comments)    Mental status changes No associated rash or fever  . Colesevelam Other (See Comments)    Leg Pain   Family History  Problem Relation Age of Onset  . Colon cancer Maternal Aunt   . Diabetes Paternal Uncle   . Heart attack Father 75  . COPD Mother   . Lung cancer Brother        smoker  . Mental illness  Unknown        niece, committed suicide.  [name] Heart attack Unknown        brother X 2; @ 63 & 12  . Stroke Neg Hx     Current Outpatient Medications (Endocrine & Metabolic):  [name]  Insulin Glargine (BASAGLAR KWIKPEN) 100 UNIT/ML SOPN, Inject 0.24 mLs (24 Units total) into the skin every morning. .  pioglitazone (ACTOS) 30 MG tablet, TAKE 1 TABLET (30 MG TOTAL) BY MOUTH DAILY.**INS TO PAY 5/11**  Current Outpatient Medications (Cardiovascular):  .  atorvastatin (LIPITOR) 40 MG tablet, TAKE 1 TABLET BY MOUTH AT 6 PM .  losartan (COZAAR) 100 MG tablet, TAKE 1 TABLET BY MOUTH EVERY DAY  Current Outpatient Medications (Respiratory):  .  promethazine (PHENERGAN) 12.5 MG tablet, Take 1 tablet (12.5 mg total) by mouth every 8 (eight) hours as needed for nausea or vomiting.  Current Outpatient Medications (Analgesics):  .  aspirin 81 MG tablet, Take 81 mg by mouth daily.   .  naproxen (NAPROSYN) 500 MG tablet, TAKE 1 TABLET (500 MG TOTAL) BY MOUTH 2 (TWO)  TIMES DAILY WITH A MEAL. [name]  traMADol (ULTRAM) 50 MG tablet, TAKE 1 TABLET (50 MG TOTAL) BY MOUTH EVERY 12 (TWELVE) HOURS.   Current Outpatient Medications (Other):  [name]  BD PEN NEEDLE NANO U/F 32G X 4 MM MISC, USE ONCE A DAY AS DIRECTED .  busPIRone (BUSPAR) 10 MG tablet, Take 10 mg by mouth 2 (two) times daily. .  DULoxetine (CYMBALTA) 60 MG capsule, Take 90 mg by mouth daily.  .  Lancets (ONETOUCH ULTRASOFT) lancets, Check blood sugar once daily. Dx code: 250.00 .  LORazepam (ATIVAN) 0.5 MG tablet, Take 0.5 mg by mouth 2 (two) times daily as needed. For anxiety. .  metroNIDAZOLE (METROGEL) 0.75 % gel, Apply 1 application topically 2 (two) times daily. [name]  omeprazole (PRILOSEC) 20 MG capsule, TAKE 1 CAPSULE BY MOUTH EVERY DAY .  ONE TOUCH ULTRA TEST test strip, USE 1 STRIP TWICE DAILY DX CODE E11.65 .  tiZANidine (ZANAFLEX) 2 MG tablet, TAKE 1 TABLET BY MOUTH EVERY 8 HOURS AS NEEDED FOR MUSCLE SPASMS .  traZODone (DESYREL) 50 MG tablet, TAKE 0.5-1 TABLETS (25-50 MG TOTAL) BY MOUTH AT BEDTIME AS NEEDED FOR SLEEP. .  Vitamin D, Ergocalciferol, (DRISDOL) 50000 units CAPS capsule, TAKE 1 CAPSULE BY MOUTH EVERY 7 DAYS    Past medical history, social, surgical and family history all reviewed in electronic medical record.  No pertanent information unless stated regarding to the chief complaint.   Review of Systems:  No headache, visual changes, nausea, vomiting, diarrhea, constipation, dizziness, abdominal pain, skin rash, fevers, chills, night sweats, weight loss, swollen lymph nodes, body aches, joint swelling, muscle aches, chest pain, shortness of breath, mood changes.   Objective  Blood pressure 116/70, pulse 90, height 5\' 6"  (1.676 m), weight 187 lb (84.8 kg), SpO2 96 %.   General: No apparent distress alert and oriented x3 mood and affect normal, dressed appropriately.  HEENT: Pupils equal, extraocular movements intact  Respiratory: Patient's speak in full sentences and does not appear  short of breath  Cardiovascular: No lower extremity edema, non tender, no erythema  Skin: Warm dry intact with no signs of infection or rash on extremities or on axial skeleton.  Abdomen: Soft nontender  Neuro: Cranial nerves II through XII are intact, neurovascularly intact in all extremities with 2+ DTRs and 2+ pulses.  Lymph: No lymphadenopathy of posterior or anterior cervical chain or axillae bilaterally.  Gait normal with good balance and coordination.  MSK:  Non tender with full range of motion and good stability and symmetric strength and tone of  elbows, wrist, hip, knee and ankles bilaterally.  \ Right shoulder exam shows the patient does have some positive impingement still remaining.  Near full range of motion.  5 out of 5 strength of the rotator cuff contralateral shoulder unremarkable  Neck exam shows some mild loss of lordosis.  Patient does have tenderness to palpation in the right scapular region.  Low back has some loss of lordosis.  Tenderness to palpation in the thoracolumbar juncture.  Mild tightness with Corky Sox test bilaterally.  Negative straight leg test  Osteopathic findings C2 flexed rotated and side bent right C6 flexed rotated and side bent left T3 extended rotated and side bent right inhaled third rib T5 extended rotated and side bent left L2 flexed rotated and side bent right Sacrum right on right    Impression and Recommendations:     This case required medical decision making of moderate complexity. The above documentation has been reviewed and is accurate and complete Lyndal Pulley, DO       Note: This dictation was prepared with Dragon dictation along with smaller phrase technology. Any transcriptional errors that result from this process are unintentional.

## 2018-09-26 NOTE — Assessment & Plan Note (Signed)
Decision today to treat with OMT was based on Physical Exam  After verbal consent patient was treated with HVLA, ME, FPR techniques in cervical, thoracic, rib, lumbar and sacral areas  Patient tolerated the procedure well with improvement in symptoms  Patient given exercises, stretches and lifestyle modifications  See medications in patient instructions if given  Patient will follow up in 6 weeks 

## 2018-09-26 NOTE — Patient Instructions (Signed)
6 weeks

## 2018-09-26 NOTE — Assessment & Plan Note (Signed)
Much improved after the injection.  Patient has full range of strength.  Full range of motion.  Patient did really well with conservative therapy at this moment.  Patient will be following up for more manipulation in 6 weeks anyhow.

## 2018-10-03 ENCOUNTER — Other Ambulatory Visit: Payer: Self-pay | Admitting: Endocrinology

## 2018-10-10 DIAGNOSIS — F4321 Adjustment disorder with depressed mood: Secondary | ICD-10-CM | POA: Diagnosis not present

## 2018-10-11 NOTE — Progress Notes (Signed)
NEUROLOGY FOLLOW UP OFFICE NOTE  Christine [name] [id number]  HISTORY OF PRESENT ILLNESS: Christine [name] is a 67 year old left-handed female with cerebrovascular disease, type 2 diabetes, depression and hypertension who follows up for migraines.  UPDATE: Intensity:  5/10 Duration:  2 hours Frequency:  Once a week Has dull headaches 4 days a week. Frequency of abortive medication: takes Excedrin Migraine for regular headache (1 day a week), tramadol (for hip pain) Rescue protocol: Takes promethazine with or without tramadol.  For tension-type headache, naproxen first or Excedrin. Current NSAIDS:  Naproxen 500mg , Advil (for jaw pain) Current analgesics:  Tramadol 50mg  (headache or jaw pain), naproxen (for tension headache) Current triptans:  none Current ergotamine:  none Current anti-emetic:  Promethazine 12.5mg  Current muscle relaxants:  Tizanidine 2mg  Current anti-anxiolytic:  Buspar Current sleep aide:  trazodone Current Antihypertensive medications:  losartan Current Antidepressant medications:  Cymbalta 60mg  Current Anticonvulsant medications:  none Current anti-CGRP:  none Current Vitamins/Herbal/Supplements:  B12, Fish oil Current Antihistamines/Decongestants:  none Other therapy:  none  Caffeine:  2 diet Cokes a day (sometimes caffeine-free) Diet:  2 colas daily.  Not much water Depression:  no; Anxiety:  no Other pain: jaw pain Sleep hygiene:  Trazodone.  Helps.  HISTORY: Onset: 2015-2016 Location: Right sided, around the right eye radiating to the temple and maxilla Quality: aching Initial intensity: 7/10 Aura: no Prodrome: no Associated symptoms: Nausea, right ptosis, photophobia, some tingling around the eye and maxilla. No visual disturbance Initial Duration: Gradual onset for several hours later in the day. Sometimes wakes her up at night Initial Frequency: 2 days per week Triggers/aggravating factors: Cold, jaw pain Relieving factors:  Heating pad on jaw Activity: Usually able to function.  She has remote history of migraines up until her 48s or 66s. These headaches are much different. She was evaluated by her dentist who found nothing wrong. She has been told it was trigeminal neuralgia and was previously treated with low dose of gabapentin, which was ineffective.  Other prior medications include sumatriptan.  MRI of brain with and without contrast from 01/23/16 was personally revealed and revealed no acute abnormality but did demonstrate chronic right frontal infarct.  Sed Rate 35, CRP negative  PAST MEDICAL HISTORY: Past Medical History:  Diagnosis Date  . Allergic rhinitis   . Diabetes mellitus   . History of recurrent UTIs    [name]  . HTN (hypertension)   . Hyperlipidemia   . Migraine headache    quiescent  . Sleep disorder    [name] May  . TIA (transient ischemic attack)     MEDICATIONS: Current Outpatient Medications on File Prior to Visit  Medication Sig Dispense Refill  . aspirin 81 MG tablet Take 81 mg by mouth daily.      [name] atorvastatin (LIPITOR) 40 MG tablet TAKE 1 TABLET BY MOUTH AT 6 PM 90 tablet 3  . BD PEN NEEDLE NANO U/F 32G X 4 MM MISC USE ONCE A DAY AS DIRECTED 100 each 3  . busPIRone (BUSPAR) 10 MG tablet Take 10 mg by mouth 2 (two) times daily.    . DULoxetine (CYMBALTA) 60 MG capsule Take 90 mg by mouth daily.   4  . Insulin Glargine (BASAGLAR KWIKPEN) 100 UNIT/ML SOPN Inject 0.24 mLs (24 Units total) into the skin every morning. 15 mL 2  . Lancets (ONETOUCH ULTRASOFT) lancets Check blood sugar once daily. Dx code: 250.00 100 each 12  . LORazepam (ATIVAN) 0.5 MG tablet Take 0.5 mg  by mouth 2 (two) times daily as needed. For anxiety.    [name] losartan (COZAAR) 100 MG tablet TAKE 1 TABLET BY MOUTH EVERY DAY 90 tablet 1  . metroNIDAZOLE (METROGEL) 0.75 % gel Apply 1 application topically 2 (two) times daily. 45 g 0  . naproxen (NAPROSYN) 500 MG tablet TAKE 1 TABLET (500 MG TOTAL) BY  MOUTH 2 (TWO) TIMES DAILY WITH A MEAL. 180 tablet 1  . omeprazole (PRILOSEC) 20 MG capsule TAKE 1 CAPSULE BY MOUTH EVERY DAY 90 capsule 1  . ONE TOUCH ULTRA TEST test strip USE 1 STRIP TWICE DAILY DX CODE E11.65 100 each 7  . pioglitazone (ACTOS) 30 MG tablet TAKE 1 TABLET (30 MG TOTAL) BY MOUTH DAILY.**INS TO PAY 5/11** 90 tablet 2  . promethazine (PHENERGAN) 12.5 MG tablet Take 1 tablet (12.5 mg total) by mouth every 8 (eight) hours as needed for nausea or vomiting. 20 tablet 3  . tiZANidine (ZANAFLEX) 2 MG tablet TAKE 1 TABLET BY MOUTH EVERY 8 HOURS AS NEEDED FOR MUSCLE SPASMS 60 tablet 1  . traMADol (ULTRAM) 50 MG tablet TAKE 1 TABLET (50 MG TOTAL) BY MOUTH EVERY 12 (TWELVE) HOURS. 60 tablet 0  . traZODone (DESYREL) 50 MG tablet TAKE 0.5-1 TABLETS (25-50 MG TOTAL) BY MOUTH AT BEDTIME AS NEEDED FOR SLEEP. 90 tablet 0  . Vitamin D, Ergocalciferol, (DRISDOL) 50000 units CAPS capsule TAKE 1 CAPSULE BY MOUTH EVERY 7 DAYS 12 capsule 0   No current facility-administered medications on file prior to visit.     ALLERGIES: Allergies  Allergen Reactions  . Vioxx [Rofecoxib] Other (See Comments)    Excess BP which caused TIA   . Prednisone Other (See Comments)    Mental status changes No associated rash or fever  . Colesevelam Other (See Comments)    Leg Pain    FAMILY HISTORY: Family History  Problem Relation Age of Onset  . Colon cancer Maternal Aunt   . Diabetes Paternal Uncle   . Heart attack Father 85  . COPD Mother   . Lung cancer Brother        smoker  . Mental illness Unknown        niece, committed suicide.  [name] Heart attack Unknown        brother X 2; @ 74 & 73  . Stroke Neg Hx    SOCIAL HISTORY: Social History   Socioeconomic History  . Marital status: Married    Spouse name: Not on file  . Number of children: Not on file  . Years of education: Not on file  . Highest education level: Not on file  Occupational History  . Occupation: Engineer, civil (consulting): [employer]  Social Needs  . Financial resource strain: Not on file  . Food insecurity:    Worry: Not on file    Inability: Not on file  . Transportation needs:    Medical: Not on file    Non-medical: Not on file  Tobacco Use  . Smoking status: Never Smoker  . Smokeless tobacco: Never Used  Substance and Sexual Activity  . Alcohol use: No  . Drug use: No  . Sexual activity: Not on file  Lifestyle  . Physical activity:    Days per week: Not on file    Minutes per session: Not on file  . Stress: Not on file  Relationships  . Social connections:    Talks on phone: Not on file    Gets  together: Not on file    Attends religious service: Not on file    Active member of club or organization: Not on file    Attends meetings of clubs or organizations: Not on file    Relationship status: Not on file  . Intimate partner violence:    Fear of current or ex partner: Not on file    Emotionally abused: Not on file    Physically abused: Not on file    Forced sexual activity: Not on file  Other Topics Concern  . Not on file  Social History Narrative   Regular exercise- no     REVIEW OF SYSTEMS: Constitutional: No fevers, chills, or sweats, no generalized fatigue, change in appetite Eyes: No visual changes, double vision, eye pain Ear, nose and throat: No hearing loss, ear pain, nasal congestion, sore throat Cardiovascular: No chest pain, palpitations Respiratory:  No shortness of breath at rest or with exertion, wheezes GastrointestinaI: No nausea, vomiting, diarrhea, abdominal pain, fecal incontinence Genitourinary:  No dysuria, urinary retention or frequency Musculoskeletal:  No neck pain, back pain Integumentary: No rash, pruritus, skin lesions Neurological: as above Psychiatric: No depression, insomnia, anxiety Endocrine: No palpitations, fatigue, diaphoresis, mood swings, change in appetite, change in weight, increased thirst Hematologic/Lymphatic:  No purpura,  petechiae. Allergic/Immunologic: no itchy/runny eyes, nasal congestion, recent allergic reactions, rashes  PHYSICAL EXAM: Blood pressure 122/74, pulse 86, height 5' 6.5" (1.689 m), weight 187 lb (84.8 kg), SpO2 97 %. General: No acute distress.  Patient appears well-groomed.  Head:  Normocephalic/atraumatic Eyes:  Fundi examined but not visualized Neck: supple, no paraspinal tenderness, full range of motion Heart:  Regular rate and rhythm Lungs:  Clear to auscultation bilaterally Back: No paraspinal tenderness Neurological Exam: alert and oriented to person, place, and time. Attention span and concentration intact, recent and remote memory intact, fund of knowledge intact.  Speech fluent and not dysarthric, language intact.  CN II-XII intact. Bulk and tone normal, muscle strength 5/5 throughout.  Sensation to light touch, temperature and vibration intact.  Deep tendon reflexes 2+ throughout, toes downgoing.  Finger to nose and heel to shin testing intact.  Gait normal, Romberg negative.  IMPRESSION: 1.  Episodic migraine, without status migrainosus, not intractable 2.  Chronic tension-type headache not intractable.  Defers preventative at this time.  PLAN: 1.  Refilled promethazine 2.  Limit use of pain relievers to no more than 2 days out of week to prevent risk of rebound or medication-overuse headache. 3.  Sleep hygiene, eliminate caffeine, increase water intake 4.  Follow up in 9 months.  Metta Clines, DO  CC: Pricilla Holm, MD

## 2018-10-12 ENCOUNTER — Encounter: Payer: Self-pay | Admitting: Neurology

## 2018-10-12 ENCOUNTER — Ambulatory Visit (INDEPENDENT_AMBULATORY_CARE_PROVIDER_SITE_OTHER): Payer: Medicare Other | Admitting: Neurology

## 2018-10-12 VITALS — BP 122/74 | HR 86 | Ht 66.5 in | Wt 187.0 lb

## 2018-10-12 DIAGNOSIS — G44229 Chronic tension-type headache, not intractable: Secondary | ICD-10-CM | POA: Diagnosis not present

## 2018-10-12 DIAGNOSIS — G43009 Migraine without aura, not intractable, without status migrainosus: Secondary | ICD-10-CM | POA: Diagnosis not present

## 2018-10-12 MED ORDER — PROMETHAZINE HCL 12.5 MG PO TABS: 12.5000 mg | ORAL_TABLET | Freq: Three times a day (TID) | ORAL | 8 refills | Status: DC | PRN

## 2018-10-12 NOTE — Patient Instructions (Addendum)
1.  Promethazine refilled. 2.  Try to stop Coca-Cola and increase water intake, follow sleep hygiene sheet 3.  Limit use of pain relievers to no more than 2 days out of week to prevent risk of rebound or medication-overuse headache. 4.  Follow up in 9 months.

## 2018-11-01 ENCOUNTER — Other Ambulatory Visit: Payer: Self-pay | Admitting: Endocrinology

## 2018-11-06 NOTE — Progress Notes (Signed)
[name] [place] [place], [place] [address] Phone: [phone] Subjective:     CC: Right hip pain, back pain and neck pain follow-up  [id number]  Christine [name] is a 67 y.o. female coming in with complaint of right hip pain. She has been having pain on and off for 3 weeks. She feels a burning sensation in right glute. Has had to use Tramadol when her pain gets back. She uses naproxen otherwise. Pain has radiated into the groin and down lateral aspect of leg. Pain with hip flexion and simultaneous trunk flexion.        Past Medical History:  Diagnosis Date  . Allergic rhinitis   . Diabetes mellitus   . History of recurrent UTIs    [name]  . HTN (hypertension)   . Hyperlipidemia   . Migraine headache    quiescent  . Sleep disorder    [name] May  . TIA (transient ischemic attack)    Past Surgical History:  Procedure Laterality Date  . COLONOSCOPY     Dr Carlean [name]; hemorrhoids  . CORONARY ANGIOPLASTY  1997   for chest pain- negative   . POLYPECTOMY  2002   benign, hyperplastic polyp; rectal bleeding 2008  . TONSILLECTOMY    . TOTAL ABDOMINAL HYSTERECTOMY  1983   BSO for endometriosis  . WISDOM TOOTH EXTRACTION     Social History   Socioeconomic History  . Marital status: Married    Spouse name: Not on file  . Number of children: Not on file  . Years of education: Not on file  . Highest education level: Not on file  Occupational History  . Occupation: Designer, television/film set: [employer]  Social Needs  . Financial resource strain: Not on file  . Food insecurity:    Worry: Not on file    Inability: Not on file  . Transportation needs:    Medical: Not on file    Non-medical: Not on file  Tobacco Use  . Smoking status: Never Smoker  . Smokeless tobacco: Never Used  Substance and Sexual Activity  . Alcohol use: No  . Drug use: No  . Sexual activity: Not on file  Lifestyle  . Physical  activity:    Days per week: Not on file    Minutes per session: Not on file  . Stress: Not on file  Relationships  . Social connections:    Talks on phone: Not on file    Gets together: Not on file    Attends religious service: Not on file    Active member of club or organization: Not on file    Attends meetings of clubs or organizations: Not on file    Relationship status: Not on file  Other Topics Concern  . Not on file  Social History Narrative   Regular exercise- no    Allergies  Allergen Reactions  . Vioxx [Rofecoxib] Other (See Comments)    Excess BP which caused TIA   . Prednisone Other (See Comments)    Mental status changes No associated rash or fever  . Colesevelam Other (See Comments)    Leg Pain   Family History  Problem Relation Age of Onset  . Colon cancer Maternal Aunt   . Diabetes Paternal Uncle   . Heart attack Father 31  . COPD Mother   . Lung cancer Brother        smoker  . Mental illness Unknown  niece, committed suicide.  [name] Heart attack Unknown        brother X 2; @ 92 & 81  . Stroke Neg Hx     Current Outpatient Medications (Endocrine & Metabolic):  [name]  Insulin Glargine (BASAGLAR KWIKPEN) 100 UNIT/ML SOPN, Inject 0.24 mLs (24 Units total) into the skin every morning. .  Insulin Glargine (BASAGLAR KWIKPEN) 100 UNIT/ML SOPN, Inject 0.24 mLs (24 Units total) into the skin every morning. .  pioglitazone (ACTOS) 30 MG tablet, TAKE 1 TABLET (30 MG TOTAL) BY MOUTH DAILY.**INS TO PAY 5/11**  Current Outpatient Medications (Cardiovascular):  .  atorvastatin (LIPITOR) 40 MG tablet, TAKE 1 TABLET BY MOUTH AT 6 PM .  losartan (COZAAR) 100 MG tablet, TAKE 1 TABLET BY MOUTH EVERY DAY  Current Outpatient Medications (Respiratory):  .  promethazine (PHENERGAN) 12.5 MG tablet, Take 1 tablet (12.5 mg total) by mouth every 8 (eight) hours as needed for nausea or vomiting.  Current Outpatient Medications (Analgesics):  .  aspirin 81 MG tablet, Take 81 mg  by mouth daily.   .  naproxen (NAPROSYN) 500 MG tablet, TAKE 1 TABLET (500 MG TOTAL) BY MOUTH 2 (TWO) TIMES DAILY WITH A MEAL. [name]  traMADol (ULTRAM) 50 MG tablet, TAKE 1 TABLET (50 MG TOTAL) BY MOUTH EVERY 12 (TWELVE) HOURS.   Current Outpatient Medications (Other):  [name]  BD PEN NEEDLE NANO U/F 32G X 4 MM MISC, USE ONCE A DAY AS DIRECTED .  busPIRone (BUSPAR) 10 MG tablet, Take 10 mg by mouth 2 (two) times daily. .  DULoxetine (CYMBALTA) 60 MG capsule, Take 90 mg by mouth daily.  .  Lancets (ONETOUCH ULTRASOFT) lancets, Check blood sugar once daily. Dx code: 250.00 .  LORazepam (ATIVAN) 0.5 MG tablet, Take 0.5 mg by mouth 2 (two) times daily as needed. For anxiety. .  metroNIDAZOLE (METROGEL) 0.75 % gel, Apply 1 application topically 2 (two) times daily. [name]  omeprazole (PRILOSEC) 20 MG capsule, TAKE 1 CAPSULE BY MOUTH EVERY DAY .  ONE TOUCH ULTRA TEST test strip, USE 1 STRIP TWICE DAILY DX CODE E11.65 .  tiZANidine (ZANAFLEX) 2 MG tablet, TAKE 1 TABLET BY MOUTH EVERY 8 HOURS AS NEEDED FOR MUSCLE SPASMS .  traZODone (DESYREL) 50 MG tablet, TAKE 0.5-1 TABLETS (25-50 MG TOTAL) BY MOUTH AT BEDTIME AS NEEDED FOR SLEEP. .  Vitamin D, Ergocalciferol, (DRISDOL) 50000 units CAPS capsule, TAKE 1 CAPSULE BY MOUTH EVERY 7 DAYS    Past medical history, social, surgical and family history all reviewed in electronic medical record.  No pertanent information unless stated regarding to the chief complaint.   Review of Systems:  No headache, visual changes, nausea, vomiting, diarrhea, constipation, dizziness, abdominal pain, skin rash, fevers, chills, night sweats, weight loss, swollen lymph nodes, body aches, joint swelling, chest pain, shortness of breath, mood changes.  Positive muscle aches  Objective  Blood pressure (!) 142/86, pulse 89, height 5' 6.5" (1.689 m), weight 190 lb (86.2 kg), SpO2 98 %.    General: No apparent distress alert and oriented x3 mood and affect normal, dressed appropriately.    HEENT: Pupils equal, extraocular movements intact  Respiratory: Patient's speak in full sentences and does not appear short of breath  Cardiovascular: No lower extremity edema, non tender, no erythema  Skin: Warm dry intact with no signs of infection or rash on extremities or on axial skeleton.  Abdomen: Soft nontender  Neuro: Cranial nerves II through XII are intact, neurovascularly intact in all extremities with 2+ DTRs  and 2+ pulses.  Lymph: No lymphadenopathy of posterior or anterior cervical chain or axillae bilaterally.  Gait normal with good balance and coordination.  MSK:  Non tender with full range of motion and good stability and symmetric strength and tone of shoulders, elbows, wrist, hip, knee and ankles bilaterally.  Back Exam:  Inspection: Loss of lordosis Motion: Flexion 45 deg, Extension 25 deg, Side Bending to 35 deg bilaterally,  Rotation to 45 deg bilaterally  SLR laying: Negative  XSLR laying: Negative  Palpable tenderness: Tender to palpation of the paraspinal musculature lumbar spine right greater than left. FABER: Tightness bilaterally with severe tenderness over the right piriformis. Sensory change: Gross sensation intact to all lumbar and sacral dermatomes.  Reflexes: 2+ at both patellar tendons, 2+ at achilles tendons, Babinski's downgoing.  Strength at foot  Plantar-flexion: 5/5 Dorsi-flexion: 5/5 Eversion: 5/5 Inversion: 5/5  Leg strength  Quad: 5/5 Hamstring: 5/5 Hip flexor: 5/5 Hip abductors: 5/5  Gait unremarkable.   Osteopathic findings C6 flexed rotated and side bent left T5 extended rotated and side bent right inhaled rib T9 extended rotated and side bent left L2 flexed rotated and side bent right Sacrum right on right   Procedure note  After verbal consent patient was prepped with alcohol swabs and with a 21-gauge 2 inch needle was injected into the right piriformis with 1 cc of 0.5% Marcaine and 1 cc of Kenalog 40 mg/mL   Impression and  Recommendations:     This case required medical decision making of moderate complexity. The above documentation has been reviewed and is accurate and complete Lyndal Pulley, DO       Note: This dictation was prepared with Dragon dictation along with smaller phrase technology. Any transcriptional errors that result from this process are unintentional.

## 2018-11-07 ENCOUNTER — Ambulatory Visit (INDEPENDENT_AMBULATORY_CARE_PROVIDER_SITE_OTHER): Payer: Medicare Other | Admitting: Family Medicine

## 2018-11-07 VITALS — BP 142/86 | HR 89 | Ht 66.5 in | Wt 190.0 lb

## 2018-11-07 DIAGNOSIS — M999 Biomechanical lesion, unspecified: Secondary | ICD-10-CM | POA: Diagnosis not present

## 2018-11-07 DIAGNOSIS — M7601 Gluteal tendinitis, right hip: Secondary | ICD-10-CM | POA: Diagnosis not present

## 2018-11-07 NOTE — Patient Instructions (Signed)
Ou are great  I think this injection should help  Stay active Have a great holiday season See me again in 6 weeks

## 2018-11-07 NOTE — Assessment & Plan Note (Signed)
Decision today to treat with OMT was based on Physical Exam  After verbal consent patient was treated with HVLA, ME, FPR techniques in cervical, thoracic, rib, lumbar and sacral areas  Patient tolerated the procedure well with improvement in symptoms  Patient given exercises, stretches and lifestyle modifications  See medications in patient instructions if given  Patient will follow up in 4-6 weeks 

## 2018-11-07 NOTE — Assessment & Plan Note (Signed)
Repeat injection given again today.  Patient wants to avoid epidurals on the back at the moment.  Discussed the potential for prednisone with patient traveling.  Discussed icing regimen and home exercises.  Responded well to manipulation otherwise.  Follow-up again in 4 to 6 weeks

## 2018-12-18 NOTE — Progress Notes (Signed)
[name] [name], [place] [address] Phone: [phone] Subjective:   [name], am serving as a scribe for Dr. Hulan Saas.   CC: Right hip pain, back pain follow-up  [id number]  Christine [name] is a 68 y.o. female coming in with complaint of right hip pain. She said that she was getting out of an Audi A6 and developed anterior hip pain. Is unable to perform hip flexion without pain. Patient also notes pain over the anterior tibia into her foot. Has also been having muscular cramping recently throughout the day.      Past Medical History:  Diagnosis Date  . Allergic rhinitis   . Diabetes mellitus   . History of recurrent UTIs    [name]  . HTN (hypertension)   . Hyperlipidemia   . Migraine headache    quiescent  . Sleep disorder    [name] May  . TIA (transient ischemic attack)    Past Surgical History:  Procedure Laterality Date  . COLONOSCOPY     Dr Carlean [name]; hemorrhoids  . CORONARY ANGIOPLASTY  1997   for chest pain- negative   . POLYPECTOMY  2002   benign, hyperplastic polyp; rectal bleeding 2008  . TONSILLECTOMY    . TOTAL ABDOMINAL HYSTERECTOMY  1983   BSO for endometriosis  . WISDOM TOOTH EXTRACTION     Social History   Socioeconomic History  . Marital status: Married    Spouse name: Not on file  . Number of children: Not on file  . Years of education: Not on file  . Highest education level: Not on file  Occupational History  . Occupation: Designer, television/film set: [employer]  Social Needs  . Financial resource strain: Not on file  . Food insecurity:    Worry: Not on file    Inability: Not on file  . Transportation needs:    Medical: Not on file    Non-medical: Not on file  Tobacco Use  . Smoking status: Never Smoker  . Smokeless tobacco: Never Used  Substance and Sexual Activity  . Alcohol use: No  . Drug use: No  . Sexual activity: Not on file  Lifestyle  .  Physical activity:    Days per week: Not on file    Minutes per session: Not on file  . Stress: Not on file  Relationships  . Social connections:    Talks on phone: Not on file    Gets together: Not on file    Attends religious service: Not on file    Active member of club or organization: Not on file    Attends meetings of clubs or organizations: Not on file    Relationship status: Not on file  Other Topics Concern  . Not on file  Social History Narrative   Regular exercise- no    Allergies  Allergen Reactions  . Vioxx [Rofecoxib] Other (See Comments)    Excess BP which caused TIA   . Prednisone Other (See Comments)    Mental status changes No associated rash or fever  . Colesevelam Other (See Comments)    Leg Pain   Family History  Problem Relation Age of Onset  . Colon cancer Maternal Aunt   . Diabetes Paternal Uncle   . Heart attack Father 86  . COPD Mother   . Lung cancer Brother        smoker  . Mental illness Unknown  niece, committed suicide.  [name] Heart attack Unknown        brother X 2; @ 33 & 69  . Stroke Neg Hx     Current Outpatient Medications (Endocrine & Metabolic):  [name]  Insulin Glargine (BASAGLAR KWIKPEN) 100 UNIT/ML SOPN, Inject 0.24 mLs (24 Units total) into the skin every morning. .  Insulin Glargine (BASAGLAR KWIKPEN) 100 UNIT/ML SOPN, Inject 0.24 mLs (24 Units total) into the skin every morning. .  pioglitazone (ACTOS) 30 MG tablet, TAKE 1 TABLET (30 MG TOTAL) BY MOUTH DAILY.**INS TO PAY 5/11**  Current Outpatient Medications (Cardiovascular):  .  atorvastatin (LIPITOR) 40 MG tablet, TAKE 1 TABLET BY MOUTH AT 6 PM .  losartan (COZAAR) 100 MG tablet, TAKE 1 TABLET BY MOUTH EVERY DAY  Current Outpatient Medications (Respiratory):  .  promethazine (PHENERGAN) 12.5 MG tablet, Take 1 tablet (12.5 mg total) by mouth every 8 (eight) hours as needed for nausea or vomiting.  Current Outpatient Medications (Analgesics):  .  aspirin 81 MG tablet,  Take 81 mg by mouth daily.   .  naproxen (NAPROSYN) 500 MG tablet, TAKE 1 TABLET (500 MG TOTAL) BY MOUTH 2 (TWO) TIMES DAILY WITH A MEAL. [name]  traMADol (ULTRAM) 50 MG tablet, TAKE 1 TABLET (50 MG TOTAL) BY MOUTH EVERY 12 (TWELVE) HOURS.   Current Outpatient Medications (Other):  [name]  BD PEN NEEDLE NANO U/F 32G X 4 MM MISC, USE ONCE A DAY AS DIRECTED .  busPIRone (BUSPAR) 10 MG tablet, Take 10 mg by mouth 2 (two) times daily. .  DULoxetine (CYMBALTA) 60 MG capsule, Take 90 mg by mouth daily.  .  Lancets (ONETOUCH ULTRASOFT) lancets, Check blood sugar once daily. Dx code: 250.00 .  LORazepam (ATIVAN) 0.5 MG tablet, Take 0.5 mg by mouth 2 (two) times daily as needed. For anxiety. .  metroNIDAZOLE (METROGEL) 0.75 % gel, Apply 1 application topically 2 (two) times daily. [name]  omeprazole (PRILOSEC) 20 MG capsule, TAKE 1 CAPSULE BY MOUTH EVERY DAY .  ONE TOUCH ULTRA TEST test strip, USE 1 STRIP TWICE DAILY DX CODE E11.65 .  tiZANidine (ZANAFLEX) 2 MG tablet, TAKE 1 TABLET BY MOUTH EVERY 8 HOURS AS NEEDED FOR MUSCLE SPASMS .  traZODone (DESYREL) 50 MG tablet, TAKE 0.5-1 TABLETS (25-50 MG TOTAL) BY MOUTH AT BEDTIME AS NEEDED FOR SLEEP. .  Vitamin D, Ergocalciferol, (DRISDOL) 50000 units CAPS capsule, TAKE 1 CAPSULE BY MOUTH EVERY 7 DAYS    Past medical history, social, surgical and family history all reviewed in electronic medical record.  No pertanent information unless stated regarding to the chief complaint.   Review of Systems:  No headache, visual changes, nausea, vomiting, diarrhea, constipation, dizziness, abdominal pain, skin rash, fevers, chills, night sweats, weight loss, swollen lymph nodes, body aches, joint swelling,  chest pain, shortness of breath, mood changes.  Positive muscle aches  Objective  Blood pressure 128/88, pulse 84, height 5' 6.5" (1.689 m), weight 190 lb (86.2 kg), SpO2 97 %.   General: No apparent distress alert and oriented x3 mood and affect normal, dressed appropriately.   HEENT: Pupils equal, extraocular movements intact  Respiratory: Patient's speak in full sentences and does not appear short of breath  Cardiovascular: No lower extremity edema, non tender, no erythema  Skin: Warm dry intact with no signs of infection or rash on extremities or on axial skeleton.  Abdomen: Soft nontender  Neuro: Cranial nerves II through XII are intact, neurovascularly intact in all extremities with 2+ DTRs and 2+  pulses.  Lymph: No lymphadenopathy of posterior or anterior cervical chain or axillae bilaterally.  Gait normal with good balance and coordination.  MSK:  tender with full range of motion and good stability and symmetric strength and tone of shoulders, elbows, wrist, , knee and ankles bilaterally.  Mild increase in discomfort with the right-sided hip flexion but otherwise fairly unremarkable.  Mild pain over the right greater trochanteric area in the right gluteal areaMild increase in discomfort with the right-sided hip flexion but otherwise fairly unremarkable.  Mild pain over the right greater trochanteric area in the right gluteal area  Osteopathic findings C7 flexed rotated and side bent left T4 extended rotated and side bent right inhaled rib T5 extended rotated and side bent left L2 flexed rotated and side bent right Sacrum right on right   Impression and Recommendations:     This case required medical decision making of moderate complexity. The above documentation has been reviewed and is accurate and complete Lyndal Pulley, DO       Note: This dictation was prepared with Dragon dictation along with smaller phrase technology. Any transcriptional errors that result from this process are unintentional.

## 2018-12-19 ENCOUNTER — Encounter: Payer: Self-pay | Admitting: Family Medicine

## 2018-12-19 ENCOUNTER — Ambulatory Visit (INDEPENDENT_AMBULATORY_CARE_PROVIDER_SITE_OTHER): Payer: Medicare Other | Admitting: Family Medicine

## 2018-12-19 VITALS — BP 128/88 | HR 84 | Ht 66.5 in | Wt 190.0 lb

## 2018-12-19 DIAGNOSIS — M5416 Radiculopathy, lumbar region: Secondary | ICD-10-CM | POA: Diagnosis not present

## 2018-12-19 DIAGNOSIS — M999 Biomechanical lesion, unspecified: Secondary | ICD-10-CM

## 2018-12-19 DIAGNOSIS — M7601 Gluteal tendinitis, right hip: Secondary | ICD-10-CM | POA: Diagnosis not present

## 2018-12-19 NOTE — Assessment & Plan Note (Signed)
Decision today to treat with OMT was based on Physical Exam  After verbal consent patient was treated with HVLA, ME, FPR techniques in cervical, thoracic, rib,  lumbar and sacral areas  Patient tolerated the procedure well with improvement in symptoms  Patient given exercises, stretches and lifestyle modifications  See medications in patient instructions if given  Patient will follow up in 4-8 weeks 

## 2018-12-19 NOTE — Assessment & Plan Note (Signed)
No true radicular symptoms at this point.  Has had a greater trochanteric bursitis as well as the gluteal tendinitis.  Monitor closely.  Discussed icing regimen and home exercise.  Discussed which activities to do which wants to avoid.  Follow-up again in 4 to 8 weeks

## 2018-12-19 NOTE — Patient Instructions (Signed)
Good to see you  Ice is your friend Stay active Lets watch the hip  See me again in 6 weeks Happy New Year!

## 2018-12-28 ENCOUNTER — Other Ambulatory Visit: Payer: Self-pay | Admitting: Internal Medicine

## 2019-01-03 ENCOUNTER — Other Ambulatory Visit: Payer: Self-pay | Admitting: Internal Medicine

## 2019-01-09 ENCOUNTER — Other Ambulatory Visit: Payer: Self-pay | Admitting: Endocrinology

## 2019-01-09 ENCOUNTER — Other Ambulatory Visit: Payer: Self-pay | Admitting: Internal Medicine

## 2019-01-09 DIAGNOSIS — F4321 Adjustment disorder with depressed mood: Secondary | ICD-10-CM | POA: Diagnosis not present

## 2019-01-10 ENCOUNTER — Ambulatory Visit (INDEPENDENT_AMBULATORY_CARE_PROVIDER_SITE_OTHER): Payer: Medicare Other | Admitting: Endocrinology

## 2019-01-10 ENCOUNTER — Encounter: Payer: Self-pay | Admitting: Endocrinology

## 2019-01-10 VITALS — BP 122/80 | HR 89 | Ht 66.5 in | Wt 188.2 lb

## 2019-01-10 DIAGNOSIS — Z794 Long term (current) use of insulin: Secondary | ICD-10-CM

## 2019-01-10 DIAGNOSIS — E1151 Type 2 diabetes mellitus with diabetic peripheral angiopathy without gangrene: Secondary | ICD-10-CM

## 2019-01-10 LAB — POCT GLYCOSYLATED HEMOGLOBIN (HGB A1C): Hemoglobin A1C: 6.5 % — AB (ref 4.0–5.6)

## 2019-01-10 MED ORDER — PIOGLITAZONE HCL 15 MG PO TABS: 15.0000 mg | ORAL_TABLET | Freq: Every day | ORAL | 3 refills | Status: DC

## 2019-01-10 MED ORDER — BASAGLAR KWIKPEN 100 UNIT/ML ~~LOC~~ SOPN: 20.0000 [IU] | PEN_INJECTOR | SUBCUTANEOUS | 2 refills | Status: DC

## 2019-01-10 NOTE — Progress Notes (Signed)
Subjective:    Patient ID: Christine [name], female    DOB: 01/20/1951, 68 y.o.   MRN: [id number]  HPI Pt returns for f/u of diabetes mellitus: DM type: Insulin-requiring type 2 Dx'ed: 1025 Complications: TIA Therapy: insulin since 2014, and pioglitizone.   GDM: never. DKA: never.  Severe hypoglycemia: never.  Pancreatitis: never.   Other: Christine [name] declines multiple daily injections; pioglitizone is for NASH; a trial to convert back to oral rx in 2017 was unsuccessful.  Interval history:  Christine [name] says cbg's vary from 75-100's.  There is no trend throughout the day.  pt states Christine [name] feels well in general.  Christine [name] takes 24 units qd.  Past Medical History:  Diagnosis Date  . Allergic rhinitis   . Diabetes mellitus   . History of recurrent UTIs    [name]  . HTN (hypertension)   . Hyperlipidemia   . Migraine headache    quiescent  . Sleep disorder    [name] May  . TIA (transient ischemic attack)     Past Surgical History:  Procedure Laterality Date  . COLONOSCOPY     Dr Carlean [name]; hemorrhoids  . CORONARY ANGIOPLASTY  1997   for chest pain- negative   . POLYPECTOMY  2002   benign, hyperplastic polyp; rectal bleeding 2008  . TONSILLECTOMY    . TOTAL ABDOMINAL HYSTERECTOMY  1983   BSO for endometriosis  . WISDOM TOOTH EXTRACTION      Social History   Socioeconomic History  . Marital status: Married    Spouse name: Not on file  . Number of children: Not on file  . Years of education: Not on file  . Highest education level: Not on file  Occupational History  . 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 Financial resource strain: Not on file  . Food insecurity:    Worry: Not on file    Inability: Not on file  . Transportation needs:    Medical: Not on file    Non-medical: Not on file  Tobacco Use  . Smoking status: Never Smoker  . Smokeless tobacco: Never Used  Substance and Sexual Activity  . Alcohol use: No  . Drug use:  No  . Sexual activity: Not on file  Lifestyle  . Physical activity:    Days per week: Not on file    Minutes per session: Not on file  . Stress: Not on file  Relationships  . Social connections:    Talks on phone: Not on file    Gets together: Not on file    Attends religious service: Not on file    Active member of club or organization: Not on file    Attends meetings of clubs or organizations: Not on file    Relationship status: Not on file  . Intimate partner violence:    Fear of current or ex partner: Not on file    Emotionally abused: Not on file    Physically abused: Not on file    Forced sexual activity: Not on file  Other Topics Concern  . Not on file  Social History Narrative   Regular exercise- no     Current Outpatient Medications on File Prior to Visit  Medication Sig Dispense Refill  . aspirin 81 MG tablet Take 81 mg by mouth daily.      [name] atorvastatin (LIPITOR) 40 MG tablet TAKE 1 TABLET BY MOUTH AT 6 PM 30 tablet 0  .  BD PEN NEEDLE NANO U/F 32G X 4 MM MISC USE ONCE A DAY AS DIRECTED 100 each 3  . DULoxetine (CYMBALTA) 60 MG capsule Take 90 mg by mouth daily.   4  . Lancets (ONETOUCH ULTRASOFT) lancets Check blood sugar once daily. Dx code: 250.00 100 each 12  . LORazepam (ATIVAN) 0.5 MG tablet Take 0.5 mg by mouth 2 (two) times daily as needed. For anxiety.    [name] losartan (COZAAR) 100 MG tablet Take 1 tablet (100 mg total) by mouth daily. Need annual visit for further refills 90 tablet 0  . naproxen (NAPROSYN) 500 MG tablet TAKE 1 TABLET (500 MG TOTAL) BY MOUTH 2 (TWO) TIMES DAILY WITH A MEAL. 180 tablet 1  . omeprazole (PRILOSEC) 20 MG capsule TAKE 1 CAPSULE BY MOUTH EVERY DAY 90 capsule 0  . ONE TOUCH ULTRA TEST test strip USE 1 STRIP TWICE DAILY DX CODE E11.65 100 each 7  . promethazine (PHENERGAN) 12.5 MG tablet Take 1 tablet (12.5 mg total) by mouth every 8 (eight) hours as needed for nausea or vomiting. 20 tablet 8  . tiZANidine (ZANAFLEX) 2 MG tablet TAKE 1  TABLET BY MOUTH EVERY 8 HOURS AS NEEDED FOR MUSCLE SPASMS 60 tablet 1  . traMADol (ULTRAM) 50 MG tablet TAKE 1 TABLET (50 MG TOTAL) BY MOUTH EVERY 12 (TWELVE) HOURS. 60 tablet 0  . traZODone (DESYREL) 50 MG tablet TAKE 0.5-1 TABLETS (25-50 MG TOTAL) BY MOUTH AT BEDTIME AS NEEDED FOR SLEEP. 90 tablet 0  . Vitamin D, Ergocalciferol, (DRISDOL) 50000 units CAPS capsule TAKE 1 CAPSULE BY MOUTH EVERY 7 DAYS 12 capsule 0   No current facility-administered medications on file prior to visit.     Allergies  Allergen Reactions  . Vioxx [Rofecoxib] Other (See Comments)    Excess BP which caused TIA   . Prednisone Other (See Comments)    Mental status changes No associated rash or fever  . Colesevelam Other (See Comments)    Leg Pain    Family History  Problem Relation Age of Onset  . Colon cancer Maternal Aunt   . Diabetes Paternal Uncle   . Heart attack Father 65  . COPD Mother   . Lung cancer Brother        smoker  . Mental illness Unknown        niece, committed suicide.  [name] Heart attack Unknown        brother X 2; @ 45 & 78  . Stroke Neg Hx     BP 122/80 (BP Location: Left Arm, Patient Position: Sitting, Cuff Size: Normal)   Pulse 89   Ht 5' 6.5" (1.689 m)   Wt 188 lb 3.2 oz (85.4 kg)   LMP  (LMP Unknown)   SpO2 96%   BMI 29.92 kg/m     Review of Systems Christine [name] denies hypoglycemia.      Objective:   Physical Exam VITAL SIGNS:  See vs page GENERAL: no distress Pulses: dorsalis pedis intact bilat.   MSK: no deformity of the feet CV: trace bilat leg edema, and bilat vv's. Skin:  no ulcer on the feet.  normal color and temp on the feet. Neuro: sensation is intact to touch on the feet  Lab Results  Component Value Date   HGBA1C 6.5 (A) 01/10/2019   Lab Results  Component Value Date   ALT 17 01/21/2018   AST 12 01/21/2018   ALKPHOS 62 01/21/2018   BILITOT 0.5 01/21/2018  Assessment & Plan:  Insulin-requiring type 2 DM: overcontrolled, given this regimen,  which does match insulin to Christine [name] changing needs throughout the day Edema: prob due to pioglitazone. NASH: well-controlled, so Christine [name] can tolerate a reduction in the pioglitazone  Patient Instructions  check your blood sugar twice a day.  vary the time of day when you check, between before the 3 meals, and at bedtime.  also check if you have symptoms of your blood sugar being too high or too low.  please keep a record of the readings and bring it to your next appointment here.  You can write it on any piece of paper.  please call us sooner if your blood sugar goes below 70, or if you have a lot of readings over 200.   Please reduce the basalgar to 20 units each morning, and:  Reduce the pioglitazone to 15 mg daily.   Please come back for a follow-up appointment in 3-4 months.

## 2019-01-10 NOTE — Patient Instructions (Addendum)
check your blood sugar twice a day.  vary the time of day when you check, between before the 3 meals, and at bedtime.  also check if you have symptoms of your blood sugar being too high or too low.  please keep a record of the readings and bring it to your next appointment here.  You can write it on any piece of paper.  please call us sooner if your blood sugar goes below 70, or if you have a lot of readings over 200.   Please reduce the basalgar to 20 units each morning, and:  Reduce the pioglitazone to 15 mg daily.   Please come back for a follow-up appointment in 3-4 months.

## 2019-01-22 ENCOUNTER — Other Ambulatory Visit: Payer: Self-pay | Admitting: Family Medicine

## 2019-02-01 ENCOUNTER — Ambulatory Visit (INDEPENDENT_AMBULATORY_CARE_PROVIDER_SITE_OTHER): Payer: Medicare Other | Admitting: Family Medicine

## 2019-02-01 ENCOUNTER — Encounter: Payer: Self-pay | Admitting: Family Medicine

## 2019-02-01 VITALS — BP 124/88 | HR 85 | Ht 66.5 in | Wt 188.0 lb

## 2019-02-01 DIAGNOSIS — M999 Biomechanical lesion, unspecified: Secondary | ICD-10-CM

## 2019-02-01 DIAGNOSIS — M5416 Radiculopathy, lumbar region: Secondary | ICD-10-CM

## 2019-02-01 MED ORDER — TRAMADOL HCL 50 MG PO TABS: 50.0000 mg | ORAL_TABLET | Freq: Four times a day (QID) | ORAL | 0 refills | Status: DC | PRN

## 2019-02-01 NOTE — Patient Instructions (Signed)
Good to see you  You are doing great  We will continue to watch the hip  Refilled tramadol  See me again in 6 weeks

## 2019-02-01 NOTE — Assessment & Plan Note (Signed)
Lumbar radiculopathy.  Discussed icing regimen and home exercise.  Discussed which activities to do which was to avoid.  Discussed increasing activity slowly over the course of next several weeks.  Discussed posture and ergonomics.  Follow-up again in 4 to 6 weeks

## 2019-02-01 NOTE — Progress Notes (Signed)
[name] [address] [place], [place] [address] Phone: [phone] Subjective:     CC: Back and neck and headache follow-up  IEP:[id number]   12/19/2018: No true radicular symptoms at this point.  Has had a greater trochanteric bursitis as well as the gluteal tendinitis.  Monitor closely.  Discussed icing regimen and home exercise.  Discussed which activities to do which wants to avoid.  Follow-up again in 4 to 8 weeks  Update 02/01/2019: Christine [name] is a 68 y.o. female coming in with complaint of back and hip pain. Patient states that her hip is doing better. Pain increases with housework and riding in the car. She did have a bad day on Sunday.      Past Medical History:  Diagnosis Date  . Allergic rhinitis   . Diabetes mellitus   . History of recurrent UTIs    [name]  . HTN (hypertension)   . Hyperlipidemia   . Migraine headache    quiescent  . Sleep disorder    [name] May  . TIA (transient ischemic attack)    Past Surgical History:  Procedure Laterality Date  . COLONOSCOPY     Dr Carlean [name]; hemorrhoids  . CORONARY ANGIOPLASTY  1997   for chest pain- negative   . POLYPECTOMY  2002   benign, hyperplastic polyp; rectal bleeding 2008  . TONSILLECTOMY    . TOTAL ABDOMINAL HYSTERECTOMY  1983   BSO for endometriosis  . WISDOM TOOTH EXTRACTION     Social History   Socioeconomic History  . Marital status: Married    Spouse name: Not on file  . Number of children: Not on file  . Years of education: Not on file  . Highest education level: Not on file  Occupational History  . Occupation: Designer, television/film set: [employer]  Social Needs  . Financial resource strain: Not on file  . Food insecurity:    Worry: Not on file    Inability: Not on file  . Transportation needs:    Medical: Not on file    Non-medical: Not on file  Tobacco Use  . Smoking status: Never Smoker  . Smokeless tobacco: Never Used    Substance and Sexual Activity  . Alcohol use: No  . Drug use: No  . Sexual activity: Not on file  Lifestyle  . Physical activity:    Days per week: Not on file    Minutes per session: Not on file  . Stress: Not on file  Relationships  . Social connections:    Talks on phone: Not on file    Gets together: Not on file    Attends religious service: Not on file    Active member of club or organization: Not on file    Attends meetings of clubs or organizations: Not on file    Relationship status: Not on file  Other Topics Concern  . Not on file  Social History Narrative   Regular exercise- no    Allergies  Allergen Reactions  . Vioxx [Rofecoxib] Other (See Comments)    Excess BP which caused TIA   . Prednisone Other (See Comments)    Mental status changes No associated rash or fever  . Colesevelam Other (See Comments)    Leg Pain   Family History  Problem Relation Age of Onset  . Colon cancer Maternal Aunt   . Diabetes Paternal Uncle   . Heart attack Father 69  .  COPD Mother   . Lung cancer Brother        smoker  . Mental illness Unknown        niece, committed suicide.  [name] Heart attack Unknown        brother X 2; @ 12 & 32  . Stroke Neg Hx     Current Outpatient Medications (Endocrine & Metabolic):  [name]  Insulin Glargine (BASAGLAR KWIKPEN) 100 UNIT/ML SOPN, Inject 0.2 mLs (20 Units total) into the skin every morning. .  pioglitazone (ACTOS) 15 MG tablet, Take 1 tablet (15 mg total) by mouth daily.  Current Outpatient Medications (Cardiovascular):  .  atorvastatin (LIPITOR) 40 MG tablet, TAKE 1 TABLET BY MOUTH AT 6 PM .  losartan (COZAAR) 100 MG tablet, Take 1 tablet (100 mg total) by mouth daily. Need annual visit for further refills  Current Outpatient Medications (Respiratory):  .  promethazine (PHENERGAN) 12.5 MG tablet, Take 1 tablet (12.5 mg total) by mouth every 8 (eight) hours as needed for nausea or vomiting.  Current Outpatient Medications (Analgesics):   .  aspirin 81 MG tablet, Take 81 mg by mouth daily.   .  naproxen (NAPROSYN) 500 MG tablet, TAKE 1 TABLET (500 MG TOTAL) BY MOUTH 2 (TWO) TIMES DAILY WITH A MEAL. [name]  traMADol (ULTRAM) 50 MG tablet, Take 1 tablet (50 mg total) by mouth every 6 (six) hours as needed.   Current Outpatient Medications (Other):  [name]  BD PEN NEEDLE NANO U/F 32G X 4 MM MISC, USE ONCE A DAY AS DIRECTED .  DULoxetine (CYMBALTA) 60 MG capsule, Take 90 mg by mouth daily.  .  Lancets (ONETOUCH ULTRASOFT) lancets, Check blood sugar once daily. Dx code: 250.00 .  LORazepam (ATIVAN) 0.5 MG tablet, Take 0.5 mg by mouth 2 (two) times daily as needed. For anxiety. [name]  omeprazole (PRILOSEC) 20 MG capsule, TAKE 1 CAPSULE BY MOUTH EVERY DAY .  ONE TOUCH ULTRA TEST test strip, USE 1 STRIP TWICE DAILY DX CODE E11.65 .  tiZANidine (ZANAFLEX) 2 MG tablet, TAKE 1 TABLET BY MOUTH EVERY 8 HOURS AS NEEDED FOR MUSCLE SPASMS .  traZODone (DESYREL) 50 MG tablet, TAKE 0.5-1 TABLETS (25-50 MG TOTAL) BY MOUTH AT BEDTIME AS NEEDED FOR SLEEP. .  Vitamin D, Ergocalciferol, (DRISDOL) 1.25 MG (50000 UT) CAPS capsule, TAKE 1 CAPSULE BY MOUTH ONE TIME PER WEEK    Past medical history, social, surgical and family history all reviewed in electronic medical record.  No pertanent information unless stated regarding to the chief complaint.   Review of Systems:  No , visual changes, nausea, vomiting, diarrhea, constipation, dizziness, abdominal pain, skin rash, fevers, chills, night sweats, weight loss, swollen lymph nodes, body aches, joint swelling,  chest pain, shortness of breath, mood changes.  Positive headaches, muscle aches  Objective  Blood pressure 124/88, pulse 85, height 5' 6.5" (1.689 m), weight 188 lb (85.3 kg), SpO2 97 %.    General: No apparent distress alert and oriented x3 mood and affect normal, dressed appropriately.  HEENT: Pupils equal, extraocular movements intact  Respiratory: Patient's speak in full sentences and does not  appear short of breath  Cardiovascular: No lower extremity edema, non tender, no erythema  Skin: Warm dry intact with no signs of infection or rash on extremities or on axial skeleton.  Abdomen: Soft nontender  Neuro: Cranial nerves II through XII are intact, neurovascularly intact in all extremities with 2+ DTRs and 2+ pulses.  Lymph: No lymphadenopathy of posterior or anterior cervical chain  or axillae bilaterally.  Gait mild antalgic gait favoring the right hip MSK:  tender with mild limited range of motion and good stability and symmetric strength and tone of shoulders, elbows, wrist, hip, knee and ankles bilaterally.   Patient's back exam does show some loss of lordosis.  Some mild increase in kyphosis of the upper back.  Patient's neck also has some mild tightness.  No radicular symptoms in any planes at the moment.  Positive Faber on the right side, neurovascular intact distally.  Strength 4+ out of 5 and symmetric to the lower extremities deep tendon reflexes intact in all extremities.  Osteopathic findings C2 flexed rotated and side bent right T3 extended rotated and side bent right inhaled third rib T5 extended rotated and side bent left L3 flexed rotated and side bent right Sacrum right on right     Impression and Recommendations:     This case required medical decision making of moderate complexity. The above documentation has been reviewed and is accurate and complete Hulan Saas, DO.      Note: This dictation was prepared with Dragon dictation along with smaller phrase technology. Any transcriptional errors that result from this process are unintentional.

## 2019-02-01 NOTE — Assessment & Plan Note (Signed)
Decision today to treat with OMT was based on Physical Exam  After verbal consent patient was treated with HVLA, ME, FPR techniques in cervical, thoracic, rib lumbar and sacral areas  Patient tolerated the procedure well with improvement in symptoms  Patient given exercises, stretches and lifestyle modifications  See medications in patient instructions if given  Patient will follow up in 4-6 weeks 

## 2019-02-05 ENCOUNTER — Other Ambulatory Visit: Payer: Self-pay | Admitting: Internal Medicine

## 2019-02-08 ENCOUNTER — Ambulatory Visit (INDEPENDENT_AMBULATORY_CARE_PROVIDER_SITE_OTHER): Payer: Medicare Other | Admitting: Internal Medicine

## 2019-02-08 ENCOUNTER — Encounter: Payer: Self-pay | Admitting: Internal Medicine

## 2019-02-08 ENCOUNTER — Other Ambulatory Visit (INDEPENDENT_AMBULATORY_CARE_PROVIDER_SITE_OTHER): Payer: Medicare Other

## 2019-02-08 VITALS — BP 146/80 | HR 81 | Temp 98.1°F | Ht 66.5 in | Wt 187.0 lb

## 2019-02-08 DIAGNOSIS — E1169 Type 2 diabetes mellitus with other specified complication: Secondary | ICD-10-CM

## 2019-02-08 DIAGNOSIS — Z1239 Encounter for other screening for malignant neoplasm of breast: Secondary | ICD-10-CM

## 2019-02-08 DIAGNOSIS — Z1231 Encounter for screening mammogram for malignant neoplasm of breast: Secondary | ICD-10-CM

## 2019-02-08 DIAGNOSIS — E785 Hyperlipidemia, unspecified: Secondary | ICD-10-CM | POA: Diagnosis not present

## 2019-02-08 DIAGNOSIS — Z23 Encounter for immunization: Secondary | ICD-10-CM | POA: Diagnosis not present

## 2019-02-08 DIAGNOSIS — Z0000 Encounter for general adult medical examination without abnormal findings: Secondary | ICD-10-CM | POA: Diagnosis not present

## 2019-02-08 DIAGNOSIS — E559 Vitamin D deficiency, unspecified: Secondary | ICD-10-CM

## 2019-02-08 DIAGNOSIS — K219 Gastro-esophageal reflux disease without esophagitis: Secondary | ICD-10-CM

## 2019-02-08 DIAGNOSIS — I10 Essential (primary) hypertension: Secondary | ICD-10-CM

## 2019-02-08 LAB — LIPID PANEL
Cholesterol: 163 mg/dL (ref 0–200)
HDL: 54.7 mg/dL (ref >39.00)
LDL Cholesterol: 92 mg/dL (ref 0–99)
NonHDL: 107.88
Total CHOL/HDL Ratio: 3
Triglycerides: 81 mg/dL (ref 0.0–149.0)
VLDL: 16.2 mg/dL (ref 0.0–40.0)

## 2019-02-08 LAB — COMPREHENSIVE METABOLIC PANEL
ALT: 18 U/L (ref 0–35)
AST: 15 U/L (ref 0–37)
Albumin: 3.9 g/dL (ref 3.5–5.2)
Alkaline Phosphatase: 57 U/L (ref 39–117)
BUN: 18 mg/dL (ref 6–23)
CHLORIDE: 102 meq/L (ref 96–112)
CO2: 28 meq/L (ref 19–32)
Calcium: 9.4 mg/dL (ref 8.4–10.5)
Creatinine, Ser: 0.67 mg/dL (ref 0.40–1.20)
GFR: 87.09 mL/min (ref >60.00)
Glucose, Bld: 147 mg/dL — ABNORMAL HIGH (ref 70–99)
Potassium: 3.8 mEq/L (ref 3.5–5.1)
Sodium: 139 mEq/L (ref 135–145)
Total Bilirubin: 0.4 mg/dL (ref 0.2–1.2)
Total Protein: 6.6 g/dL (ref 6.0–8.3)

## 2019-02-08 LAB — CBC
HCT: 38.4 % (ref 36.0–46.0)
Hemoglobin: 12.9 g/dL (ref 12.0–15.0)
MCHC: 33.6 g/dL (ref 30.0–36.0)
MCV: 89.2 fl (ref 78.0–100.0)
Platelets: 202 10*3/uL (ref 150.0–400.0)
RBC: 4.31 Mil/uL (ref 3.87–5.11)
RDW: 14.3 % (ref 11.5–15.5)
WBC: 4.8 10*3/uL (ref 4.0–10.5)

## 2019-02-08 NOTE — Progress Notes (Signed)
   Subjective:   Patient ID: Christine [name], female    DOB: 1951/03/22, 68 y.o.   MRN: [id number]  HPI Here for medicare wellness, no new complaints. Please see A/P for status and treatment of chronic medical problems.   HPI #2: Here for follow up blood pressure (at goal on losartan, denies chest pains or headaches more often, denies side effects), and cholesterol (taking lipitor without side effects, denies chest pains or stroke symptoms, no muscle aches), and her GERD (taking omeprazole daily, gets symptoms with missing doses, denies dark stool or blood in stool).   Diet: DM since diabetic Physical activity: sedentary Depression/mood screen: negative Hearing: intact to whispered voice Visual acuity: grossly normal, performs annual eye exam  ADLs: capable Fall risk: none Home safety: good Cognitive evaluation: intact to orientation, naming, recall and repetition EOL planning: adv directives discussed    Office Visit from 02/08/2019 in [facility]  PHQ-2 Total Score  0      I have personally reviewed and have noted 1. The patient's medical and social history - reviewed today no changes 2. Their use of alcohol, tobacco or illicit drugs 3. Their current medications and supplements 4. The patient's functional ability including ADL's, fall risks, home safety risks and hearing or visual impairment. 5. Diet and physical activities 6. Evidence for depression or mood disorders 7. Care team reviewed and updated (available in snapshot)  Review of Systems  Constitutional: Negative.   HENT: Positive for congestion and postnasal drip.   Eyes: Negative.   Respiratory: Negative for cough, chest tightness and shortness of breath.   Cardiovascular: Negative for chest pain, palpitations and leg swelling.  Gastrointestinal: Positive for nausea. Negative for abdominal distention, abdominal pain, constipation, diarrhea and vomiting.  Musculoskeletal: Negative.   Skin:  Negative.   Neurological: Negative.   Psychiatric/Behavioral: Negative.     Objective:  Physical Exam Constitutional:      Appearance: She is well-developed.  HENT:     Head: Normocephalic and atraumatic.  Neck:     Musculoskeletal: Normal range of motion.  Cardiovascular:     Rate and Rhythm: Normal rate and regular rhythm.  Pulmonary:     Effort: Pulmonary effort is normal. No respiratory distress.     Breath sounds: Normal breath sounds. No wheezing or rales.  Abdominal:     General: Bowel sounds are normal. There is no distension.     Palpations: Abdomen is soft.     Tenderness: There is no abdominal tenderness. There is no rebound.  Skin:    General: Skin is warm and dry.  Neurological:     Mental Status: She is alert and oriented to person, place, and time.     Coordination: Coordination normal.     Vitals:   02/08/19 1031  BP: (!) 146/80  Pulse: 81  Temp: 98.1 F (36.7 C)  TempSrc: Oral  SpO2: 95%  Weight: 187 lb (84.8 kg)  Height: 5' 6.5" (1.689 m)    Assessment & Plan:  Pneumonia 23 given at visit

## 2019-02-08 NOTE — Assessment & Plan Note (Signed)
Flu shot up to date. Pneumonia given 23 to complete series. Shingrix counseled. Tetanus up to date. Colonoscopy due but she declines today. Mammogram ordered, pap smear aged out and dexa up to date. Counseled about sun safety and mole surveillance. Counseled about the dangers of distracted driving. Given 10 year screening recommendations.

## 2019-02-08 NOTE — Assessment & Plan Note (Signed)
Checking lipid panel and adjust as needed. Taking lipitor.

## 2019-02-08 NOTE — Assessment & Plan Note (Signed)
Checking CMP and adjust losartan 100 mg daily. BP at goal.

## 2019-02-08 NOTE — Assessment & Plan Note (Signed)
Taking omeprazole 20 mg daily and does still need as she gets symptoms without.

## 2019-02-08 NOTE — Assessment & Plan Note (Signed)
Checking vitamin D and adjust as needed. 

## 2019-02-08 NOTE — Patient Instructions (Signed)
We will check the labs today.   We have ordered the mammogram and have given you the pneumonia shot today.  Health Maintenance, Female Adopting a healthy lifestyle and getting preventive care can go a long way to promote health and wellness. Talk with your health care provider about what schedule of regular examinations is right for you. This is a good chance for you to check in with your provider about disease prevention and staying healthy. In between checkups, there are plenty of things you can do on your own. Experts have done a lot of research about which lifestyle changes and preventive measures are most likely to keep you healthy. Ask your health care provider for more information. Weight and diet Eat a healthy diet  Be sure to include plenty of vegetables, fruits, low-fat dairy products, and lean protein.  Do not eat a lot of foods high in solid fats, added sugars, or salt.  Get regular exercise. This is one of the most important things you can do for your health. ? Most adults should exercise for at least 150 minutes each week. The exercise should increase your heart rate and make you sweat (moderate-intensity exercise). ? Most adults should also do strengthening exercises at least twice a week. This is in addition to the moderate-intensity exercise. Maintain a healthy weight  Body mass index (BMI) is a measurement that can be used to identify possible weight problems. It estimates body fat based on height and weight. Your health care provider can help determine your BMI and help you achieve or maintain a healthy weight.  For females 38 years of age and older: ? A BMI below 18.5 is considered underweight. ? A BMI of 18.5 to 24.9 is normal. ? A BMI of 25 to 29.9 is considered overweight. ? A BMI of 30 and above is considered obese. Watch levels of cholesterol and blood lipids  You should start having your blood tested for lipids and cholesterol at 70 years of age, then have this  test every 5 years.  You may need to have your cholesterol levels checked more often if: ? Your lipid or cholesterol levels are high. ? You are older than 70 years of age. ? You are at high risk for heart disease. Cancer screening Lung Cancer  Lung cancer screening is recommended for adults 87-79 years old who are at high risk for lung cancer because of a history of smoking.  A yearly low-dose CT scan of the lungs is recommended for people who: ? Currently smoke. ? Have quit within the past 15 years. ? Have at least a 30-pack-year history of smoking. A pack year is smoking an average of one pack of cigarettes a day for 1 year.  Yearly screening should continue until it has been 15 years since you quit.  Yearly screening should stop if you develop a health problem that would prevent you from having lung cancer treatment. Breast Cancer  Practice breast self-awareness. This means understanding how your breasts normally appear and feel.  It also means doing regular breast self-exams. Let your health care provider know about any changes, no matter how small.  If you are in your 20s or 30s, you should have a clinical breast exam (CBE) by a health care provider every 1-3 years as part of a regular health exam.  If you are 37 or older, have a CBE every year. Also consider having a breast X-ray (mammogram) every year.  If you have a family history of  breast cancer, talk to your health care provider about genetic screening.  If you are at high risk for breast cancer, talk to your health care provider about having an MRI and a mammogram every year.  Breast cancer gene (BRCA) assessment is recommended for women who have family members with BRCA-related cancers. BRCA-related cancers include: ? Breast. ? Ovarian. ? Tubal. ? Peritoneal cancers.  Results of the assessment will determine the need for genetic counseling and BRCA1 and BRCA2 testing. Cervical Cancer Your health care provider may  recommend that you be screened regularly for cancer of the pelvic organs (ovaries, uterus, and vagina). This screening involves a pelvic examination, including checking for microscopic changes to the surface of your cervix (Pap test). You may be encouraged to have this screening done every 3 years, beginning at age 60.  For women ages 47-65, health care providers may recommend pelvic exams and Pap testing every 3 years, or they may recommend the Pap and pelvic exam, combined with testing for human papilloma virus (HPV), every 5 years. Some types of HPV increase your risk of cervical cancer. Testing for HPV may also be done on women of any age with unclear Pap test results.  Other health care providers may not recommend any screening for nonpregnant women who are considered low risk for pelvic cancer and who do not have symptoms. Ask your health care provider if a screening pelvic exam is right for you.  If you have had past treatment for cervical cancer or a condition that could lead to cancer, you need Pap tests and screening for cancer for at least 20 years after your treatment. If Pap tests have been discontinued, your risk factors (such as having a new sexual partner) need to be reassessed to determine if screening should resume. Some women have medical problems that increase the chance of getting cervical cancer. In these cases, your health care provider may recommend more frequent screening and Pap tests. Colorectal Cancer  This type of cancer can be detected and often prevented.  Routine colorectal cancer screening usually begins at 70 years of age and continues through 70 years of age.  Your health care provider may recommend screening at an earlier age if you have risk factors for colon cancer.  Your health care provider may also recommend using home test kits to check for hidden blood in the stool.  A small camera at the end of a tube can be used to examine your colon directly  (sigmoidoscopy or colonoscopy). This is done to check for the earliest forms of colorectal cancer.  Routine screening usually begins at age 45.  Direct examination of the colon should be repeated every 5-10 years through 70 years of age. However, you may need to be screened more often if early forms of precancerous polyps or small growths are found. Skin Cancer  Check your skin from head to toe regularly.  Tell your health care provider about any new moles or changes in moles, especially if there is a change in a mole's shape or color.  Also tell your health care provider if you have a mole that is larger than the size of a pencil eraser.  Always use sunscreen. Apply sunscreen liberally and repeatedly throughout the day.  Protect yourself by wearing long sleeves, pants, a wide-brimmed hat, and sunglasses whenever you are outside. Heart disease, diabetes, and high blood pressure  High blood pressure causes heart disease and increases the risk of stroke. High blood pressure is more likely  to develop in: ? People who have blood pressure in the high end of the normal range (130-139/85-89 mm Hg). ? People who are overweight or obese. ? People who are African American.  If you are 59-12 years of age, have your blood pressure checked every 3-5 years. If you are 24 years of age or older, have your blood pressure checked every year. You should have your blood pressure measured twice-once when you are at a hospital or clinic, and once when you are not at a hospital or clinic. Record the average of the two measurements. To check your blood pressure when you are not at a hospital or clinic, you can use: ? An automated blood pressure machine at a pharmacy. ? A home blood pressure monitor.  If you are between 31 years and 38 years old, ask your health care provider if you should take aspirin to prevent strokes.  Have regular diabetes screenings. This involves taking a blood sample to check your  fasting blood sugar level. ? If you are at a normal weight and have a low risk for diabetes, have this test once every three years after 70 years of age. ? If you are overweight and have a high risk for diabetes, consider being tested at a younger age or more often. Preventing infection Hepatitis B  If you have a higher risk for hepatitis B, you should be screened for this virus. You are considered at high risk for hepatitis B if: ? You were born in a country where hepatitis B is common. Ask your health care provider which countries are considered high risk. ? Your parents were born in a high-risk country, and you have not been immunized against hepatitis B (hepatitis B vaccine). ? You have HIV or AIDS. ? You use needles to inject street drugs. ? You live with someone who has hepatitis B. ? You have had sex with someone who has hepatitis B. ? You get hemodialysis treatment. ? You take certain medicines for conditions, including cancer, organ transplantation, and autoimmune conditions. Hepatitis C  Blood testing is recommended for: ? Everyone born from [birth year] through 1967. ? Anyone with known risk factors for hepatitis C. Sexually transmitted infections (STIs)  You should be screened for sexually transmitted infections (STIs) including gonorrhea and chlamydia if: ? You are sexually active and are younger than 70 years of age. ? You are older than 70 years of age and your health care provider tells you that you are at risk for this type of infection. ? Your sexual activity has changed since you were last screened and you are at an increased risk for chlamydia or gonorrhea. Ask your health care provider if you are at risk.  If you do not have HIV, but are at risk, it may be recommended that you take a prescription medicine daily to prevent HIV infection. This is called pre-exposure prophylaxis (PrEP). You are considered at risk if: ? You are sexually active and do not regularly use condoms or  know the HIV status of your partner(s). ? You take drugs by injection. ? You are sexually active with a partner who has HIV. Talk with your health care provider about whether you are at high risk of being infected with HIV. If you choose to begin PrEP, you should first be tested for HIV. You should then be tested every 3 months for as long as you are taking PrEP. Pregnancy  If you are premenopausal and you may become pregnant, ask your  health care provider about preconception counseling.  If you may become pregnant, take 400 to 800 micrograms (mcg) of folic acid every day.  If you want to prevent pregnancy, talk to your health care provider about birth control (contraception). Osteoporosis and menopause  Osteoporosis is a disease in which the bones lose minerals and strength with aging. This can result in serious bone fractures. Your risk for osteoporosis can be identified using a bone density scan.  If you are 22 years of age or older, or if you are at risk for osteoporosis and fractures, ask your health care provider if you should be screened.  Ask your health care provider whether you should take a calcium or vitamin D supplement to lower your risk for osteoporosis.  Menopause may have certain physical symptoms and risks.  Hormone replacement therapy may reduce some of these symptoms and risks. Talk to your health care provider about whether hormone replacement therapy is right for you. Follow these instructions at home:  Schedule regular health, dental, and eye exams.  Stay current with your immunizations.  Do not use any tobacco products including cigarettes, chewing tobacco, or electronic cigarettes.  If you are pregnant, do not drink alcohol.  If you are breastfeeding, limit how much and how often you drink alcohol.  Limit alcohol intake to no more than 1 drink per day for nonpregnant women. One drink equals 12 ounces of beer, 5 ounces of wine, or 1 ounces of hard  liquor.  Do not use street drugs.  Do not share needles.  Ask your health care provider for help if you need support or information about quitting drugs.  Tell your health care provider if you often feel depressed.  Tell your health care provider if you have ever been abused or do not feel safe at home. This information is not intended to replace advice given to you by your health care provider. Make sure you discuss any questions you have with your health care provider. Document Released: 06/15/2011 Document Revised: 05/07/2016 Document Reviewed: 09/03/2015 Elsevier Interactive Patient Education  2019 [employer].

## 2019-02-09 LAB — VITAMIN D 25 HYDROXY (VIT D DEFICIENCY, FRACTURES): VITD: 46.16 ng/mL (ref 30.00–100.00)

## 2019-02-16 ENCOUNTER — Other Ambulatory Visit: Payer: Self-pay | Admitting: Internal Medicine

## 2019-02-16 MED ORDER — ATORVASTATIN CALCIUM 40 MG PO TABS: ORAL_TABLET | ORAL | 3 refills | Status: DC

## 2019-02-16 NOTE — Telephone Encounter (Signed)
Requested Prescriptions  Pending Prescriptions Disp Refills  . atorvastatin (LIPITOR) 40 MG tablet 90 tablet 3     Cardiovascular:  Antilipid - Statins Passed - 02/16/2019  3:31 PM      Passed - Total Cholesterol in normal range and within 360 days    Cholesterol  Date Value Ref Range Status  02/08/2019 163 0 - 200 mg/dL Final    Comment:    ATP III Classification       Desirable:  < 200 mg/dL               Borderline High:  200 - 239 mg/dL          High:  > = 240 mg/dL         Passed - LDL in normal range and within 360 days    LDL Cholesterol  Date Value Ref Range Status  02/08/2019 92 0 - 99 mg/dL Final         Passed - HDL in normal range and within 360 days    HDL  Date Value Ref Range Status  02/08/2019 54.70 >39.00 mg/dL Final         Passed - Triglycerides in normal range and within 360 days    Triglycerides  Date Value Ref Range Status  02/08/2019 81.0 0.0 - 149.0 mg/dL Final    Comment:    Normal:  <150 mg/dLBorderline High:  150 - 199 mg/dL         Passed - Patient is not pregnant      Passed - Valid encounter within last 12 months    Recent Outpatient Visits          1 week ago Routine general medical examination at a health care facility   [place], [place], MD   2 weeks ago Nonallopathic lesion of lumbosacral region   [place], Olevia Bowens, DO   1 month ago Gluteal tendonitis of right buttock   [place], [place], DO   3 months ago Nonallopathic lesion of lumbosacral region   [place], [place], DO   4 months ago Nonallopathic lesion of lumbosacral region   [place], [place], DO      Future Appointments            In 3 weeks Lyndal Pulley, [place], [facility]

## 2019-02-16 NOTE — Telephone Encounter (Signed)
Copied from [place] [id number]. Topic: Quick Communication - Rx Refill/Question >> Feb 16, 2019  3:09 PM Keene Breath wrote: Medication: atorvastatin (LIPITOR) 40 MG tablet  Patient called to request a refill for the above medication  Preferred Pharmacy (with phone number or street name): [id number] [address], [place]. [phone] (Phone) [phone] (Fax)

## 2019-03-15 ENCOUNTER — Ambulatory Visit: Payer: Medicare Other | Admitting: Family Medicine

## 2019-03-17 ENCOUNTER — Ambulatory Visit: Payer: Medicare Other

## 2019-03-28 ENCOUNTER — Other Ambulatory Visit: Payer: Self-pay | Admitting: Internal Medicine

## 2019-03-30 ENCOUNTER — Other Ambulatory Visit: Payer: Self-pay | Admitting: Internal Medicine

## 2019-04-11 ENCOUNTER — Encounter: Payer: Self-pay | Admitting: Endocrinology

## 2019-04-11 ENCOUNTER — Ambulatory Visit (INDEPENDENT_AMBULATORY_CARE_PROVIDER_SITE_OTHER): Payer: Medicare Other | Admitting: Endocrinology

## 2019-04-11 ENCOUNTER — Ambulatory Visit: Payer: Medicare Other | Admitting: Endocrinology

## 2019-04-11 ENCOUNTER — Other Ambulatory Visit: Payer: Self-pay

## 2019-04-11 VITALS — BP 142/72 | HR 101 | Ht 66.5 in | Wt 183.8 lb

## 2019-04-11 DIAGNOSIS — E1151 Type 2 diabetes mellitus with diabetic peripheral angiopathy without gangrene: Secondary | ICD-10-CM | POA: Diagnosis not present

## 2019-04-11 DIAGNOSIS — Z794 Long term (current) use of insulin: Secondary | ICD-10-CM | POA: Diagnosis not present

## 2019-04-11 LAB — POCT GLYCOSYLATED HEMOGLOBIN (HGB A1C): Hemoglobin A1C: 7.2 % — AB (ref 4.0–5.6)

## 2019-04-11 NOTE — Patient Instructions (Addendum)
check your blood sugar twice a day.  vary the time of day when you check, between before the 3 meals, and at bedtime.  also check if you have symptoms of your blood sugar being too high or too low.  please keep a record of the readings and bring it to your next appointment here.  You can write it on any piece of paper.  please call us sooner if your blood sugar goes below 70, or if you have a lot of readings over 200.   Please continue the same medications. Please come back for a follow-up appointment in 4 months.

## 2019-04-11 NOTE — Progress Notes (Signed)
Subjective:    Patient ID: Christine [name], female    DOB: November 09, 1951, 68 y.o.   MRN: [id number]  HPI Pt returns for f/u of diabetes mellitus: DM type: Insulin-requiring type 2 Dx'ed: 2440 Complications: TIA Therapy: insulin since 2014, and pioglitizone.   GDM: never. DKA: never.  Severe hypoglycemia: never.  Pancreatitis: never.   Other: She declines multiple daily injections; pioglitizone is for NASH; a trial to convert back to oral rx in 2017 was unsuccessful.  Interval history:  She says cbg's vary from 101-170.  There is no trend throughout the day.  pt states she feels well in general.   Past Medical History:  Diagnosis Date  . Allergic rhinitis   . Diabetes mellitus   . History of recurrent UTIs    [name]  . HTN (hypertension)   . Hyperlipidemia   . Migraine headache    quiescent  . Sleep disorder    [name] May  . TIA (transient ischemic attack)     Past Surgical History:  Procedure Laterality Date  . COLONOSCOPY     Dr Carlean [name]; hemorrhoids  . CORONARY ANGIOPLASTY  1997   for chest pain- negative   . POLYPECTOMY  2002   benign, hyperplastic polyp; rectal bleeding 2008  . TONSILLECTOMY    . TOTAL ABDOMINAL HYSTERECTOMY  1983   BSO for endometriosis  . WISDOM TOOTH EXTRACTION      Social History   Socioeconomic History  . Marital status: Married    Spouse name: Not on file  . Number of children: Not on file  . Years of education: Not on file  . Highest education level: Not on file  Occupational History  . Occupation: Designer, television/film set: [employer]  Social Needs  . Financial resource strain: Not on file  . Food insecurity:    Worry: Not on file    Inability: Not on file  . Transportation needs:    Medical: Not on file    Non-medical: Not on file  Tobacco Use  . Smoking status: Never Smoker  . Smokeless tobacco: Never Used  Substance and Sexual Activity  . Alcohol use: No  . Drug use: No  . Sexual activity:  Not on file  Lifestyle  . Physical activity:    Days per week: Not on file    Minutes per session: Not on file  . Stress: Not on file  Relationships  . Social connections:    Talks on phone: Not on file    Gets together: Not on file    Attends religious service: Not on file    Active member of club or organization: Not on file    Attends meetings of clubs or organizations: Not on file    Relationship status: Not on file  . Intimate partner violence:    Fear of current or ex partner: Not on file    Emotionally abused: Not on file    Physically abused: Not on file    Forced sexual activity: Not on file  Other Topics Concern  . Not on file  Social History Narrative   Regular exercise- no     Current Outpatient Medications on File Prior to Visit  Medication Sig Dispense Refill  . aspirin 81 MG tablet Take 81 mg by mouth daily.      [name] atorvastatin (LIPITOR) 40 MG tablet TAKE 1 TABLET BY MOUTH AT 6 PM 90 tablet 3  . BD PEN NEEDLE NANO U/F  32G X 4 MM MISC USE ONCE A DAY AS DIRECTED 100 each 3  . DULoxetine (CYMBALTA) 60 MG capsule Take 90 mg by mouth daily.   4  . Insulin Glargine (BASAGLAR KWIKPEN) 100 UNIT/ML SOPN Inject 0.2 mLs (20 Units total) into the skin every morning. 15 mL 2  . Lancets (ONETOUCH ULTRASOFT) lancets Check blood sugar once daily. Dx code: 250.00 100 each 12  . LORazepam (ATIVAN) 0.5 MG tablet Take 0.5 mg by mouth 2 (two) times daily as needed. For anxiety.    [name] losartan (COZAAR) 100 MG tablet Take 1 tablet (100 mg total) by mouth daily. 90 tablet 2  . naproxen (NAPROSYN) 500 MG tablet TAKE 1 TABLET (500 MG TOTAL) BY MOUTH 2 (TWO) TIMES DAILY WITH A MEAL. 180 tablet 1  . omeprazole (PRILOSEC) 20 MG capsule TAKE 1 CAPSULE BY MOUTH EVERY DAY 90 capsule 3  . ONE TOUCH ULTRA TEST test strip USE 1 STRIP TWICE DAILY DX CODE E11.65 100 each 7  . pioglitazone (ACTOS) 15 MG tablet Take 1 tablet (15 mg total) by mouth daily. 90 tablet 3  . promethazine (PHENERGAN) 12.5 MG  tablet Take 1 tablet (12.5 mg total) by mouth every 8 (eight) hours as needed for nausea or vomiting. 20 tablet 8  . tiZANidine (ZANAFLEX) 2 MG tablet TAKE 1 TABLET BY MOUTH EVERY 8 HOURS AS NEEDED FOR MUSCLE SPASMS 60 tablet 1  . traMADol (ULTRAM) 50 MG tablet Take 1 tablet (50 mg total) by mouth every 6 (six) hours as needed. 20 tablet 0  . traZODone (DESYREL) 50 MG tablet TAKE 0.5-1 TABLETS (25-50 MG TOTAL) BY MOUTH AT BEDTIME AS NEEDED FOR SLEEP. 90 tablet 0  . Vitamin D, Ergocalciferol, (DRISDOL) 1.25 MG (50000 UT) CAPS capsule TAKE 1 CAPSULE BY MOUTH ONE TIME PER WEEK 12 capsule 0   No current facility-administered medications on file prior to visit.     Allergies  Allergen Reactions  . Vioxx [Rofecoxib] Other (See Comments)    Excess BP which caused TIA   . Prednisone Other (See Comments)    Mental status changes No associated rash or fever  . Colesevelam Other (See Comments)    Leg Pain    Family History  Problem Relation Age of Onset  . Colon cancer Maternal Aunt   . Diabetes Paternal Uncle   . Heart attack Father 52  . COPD Mother   . Lung cancer Brother        smoker  . Mental illness Other        niece, committed suicide.  [name] Heart attack Other        brother X 2; @ 17 & 76  . Stroke Neg Hx     BP (!) 142/72 (BP Location: Left Arm, Patient Position: Sitting, Cuff Size: Large)   Pulse (!) 101   Ht 5' 6.5" (1.689 m)   Wt 183 lb 12.8 oz (83.4 kg)   LMP  (LMP Unknown)   SpO2 97%   BMI 29.22 kg/m   Review of Systems She denies hypoglycemia.     Objective:   Physical Exam VITAL SIGNS:  See vs page GENERAL: no distress Pulses: dorsalis pedis intact bilat.   MSK: no deformity of the feet CV: no leg edema Skin:  no ulcer on the feet.  normal color and temp on the feet. Neuro: sensation is intact to touch on the feet   Lab Results  Component Value Date   HGBA1C 7.2 (A) 04/11/2019  Lab Results  Component Value Date   ALT 18 02/08/2019   AST 15  02/08/2019   ALKPHOS 57 02/08/2019   BILITOT 0.4 02/08/2019      Assessment & Plan:  Insulin-requiring type 2 DM, with TIA: this is the best control this pt should aim for, given this regimen, which does match insulin to her changing needs throughout the day NASH: well-controlled.  HTN: I advised recheck with PCP soon.   Patient Instructions  check your blood sugar twice a day.  vary the time of day when you check, between before the 3 meals, and at bedtime.  also check if you have symptoms of your blood sugar being too high or too low.  please keep a record of the readings and bring it to your next appointment here.  You can write it on any piece of paper.  please call us sooner if your blood sugar goes below 70, or if you have a lot of readings over 200.   Please continue the same medications. Please come back for a follow-up appointment in 4 months.

## 2019-04-14 ENCOUNTER — Ambulatory Visit: Payer: Medicare Other

## 2019-04-17 ENCOUNTER — Encounter: Payer: Self-pay | Admitting: Endocrinology

## 2019-04-17 NOTE — Telephone Encounter (Signed)
Please advise 

## 2019-04-24 ENCOUNTER — Other Ambulatory Visit: Payer: Self-pay | Admitting: Family Medicine

## 2019-04-25 ENCOUNTER — Encounter: Payer: Self-pay | Admitting: Family Medicine

## 2019-04-25 ENCOUNTER — Ambulatory Visit (INDEPENDENT_AMBULATORY_CARE_PROVIDER_SITE_OTHER): Payer: Medicare Other | Admitting: Family Medicine

## 2019-04-25 ENCOUNTER — Other Ambulatory Visit: Payer: Self-pay

## 2019-04-25 VITALS — BP 144/78 | HR 92 | Ht 66.5 in | Wt 186.0 lb

## 2019-04-25 DIAGNOSIS — M999 Biomechanical lesion, unspecified: Secondary | ICD-10-CM | POA: Diagnosis not present

## 2019-04-25 DIAGNOSIS — M5416 Radiculopathy, lumbar region: Secondary | ICD-10-CM

## 2019-04-25 NOTE — Progress Notes (Signed)
[name] [address] [place], [place] [address] Phone: [phone] Subjective:   I [name] am serving as a Education administrator for Dr. Hulan Saas.   CC: neck and back pain follow up   [id number]  Christine [name] is a 68 y.o. female coming in with complaint of back pain. States that her back has been until last week. Believes it is another disc injury. Pain feels about the same. Has been having issues with high blood pressure lately.  Patient has noted some tightness recently.    Past Medical History:  Diagnosis Date   Allergic rhinitis    Diabetes mellitus    History of recurrent UTIs    [name]   HTN (hypertension)    Hyperlipidemia    Migraine headache    quiescent   Sleep disorder    [name] May   TIA (transient ischemic attack)    Past Surgical History:  Procedure Laterality Date   COLONOSCOPY     Dr Carlean [name]; hemorrhoids   CORONARY ANGIOPLASTY  1997   for chest pain- negative    POLYPECTOMY  2002   benign, hyperplastic polyp; rectal bleeding 2008   TONSILLECTOMY     TOTAL ABDOMINAL HYSTERECTOMY  1983   BSO for endometriosis   WISDOM TOOTH EXTRACTION     Social History   Socioeconomic History   Marital status: Married    Spouse name: Not on file   Number of children: Not on file   Years of education: Not on file   Highest education level: Not on file  Occupational History   Occupation: Designer, television/film set: [employer]  Social Needs   Financial resource strain: Not on file   Food insecurity:    Worry: Not on file    Inability: Not on file   Transportation needs:    Medical: Not on file    Non-medical: Not on file  Tobacco Use   Smoking status: Never Smoker   Smokeless tobacco: Never Used  Substance and Sexual Activity   Alcohol use: No   Drug use: No   Sexual activity: Not on file  Lifestyle   Physical activity:    Days per week: Not on file    Minutes  per session: Not on file   Stress: Not on file  Relationships   Social connections:    Talks on phone: Not on file    Gets together: Not on file    Attends religious service: Not on file    Active member of club or organization: Not on file    Attends meetings of clubs or organizations: Not on file    Relationship status: Not on file  Other Topics Concern   Not on file  Social History Narrative   Regular exercise- no    Allergies  Allergen Reactions   Vioxx [Rofecoxib] Other (See Comments)    Excess BP which caused TIA    Prednisone Other (See Comments)    Mental status changes No associated rash or fever   Colesevelam Other (See Comments)    Leg Pain   Family History  Problem Relation Age of Onset   Colon cancer Maternal Aunt    Diabetes Paternal Uncle    Heart attack Father 77   COPD Mother    Lung cancer Brother        smoker   Mental illness Other        niece, committed suicide.  Heart attack Other        brother X 2; @ 62 & 12   Stroke Neg Hx     Current Outpatient Medications (Endocrine & Metabolic):    Insulin Glargine (BASAGLAR KWIKPEN) 100 UNIT/ML SOPN, Inject 0.2 mLs (20 Units total) into the skin every morning.   pioglitazone (ACTOS) 15 MG tablet, Take 1 tablet (15 mg total) by mouth daily.  Current Outpatient Medications (Cardiovascular):    atorvastatin (LIPITOR) 40 MG tablet, TAKE 1 TABLET BY MOUTH AT 6 PM   losartan (COZAAR) 100 MG tablet, Take 1 tablet (100 mg total) by mouth daily.  Current Outpatient Medications (Respiratory):    promethazine (PHENERGAN) 12.5 MG tablet, Take 1 tablet (12.5 mg total) by mouth every 8 (eight) hours as needed for nausea or vomiting.  Current Outpatient Medications (Analgesics):    aspirin 81 MG tablet, Take 81 mg by mouth daily.     naproxen (NAPROSYN) 500 MG tablet, TAKE 1 TABLET (500 MG TOTAL) BY MOUTH 2 (TWO) TIMES DAILY WITH A MEAL.   traMADol (ULTRAM) 50 MG tablet, Take 1 tablet (50 mg  total) by mouth every 6 (six) hours as needed.   Current Outpatient Medications (Other):    BD PEN NEEDLE NANO U/F 32G X 4 MM MISC, USE ONCE A DAY AS DIRECTED   DULoxetine (CYMBALTA) 60 MG capsule, Take 90 mg by mouth daily.    Lancets (ONETOUCH ULTRASOFT) lancets, Check blood sugar once daily. Dx code: 250.00   LORazepam (ATIVAN) 0.5 MG tablet, Take 0.5 mg by mouth 2 (two) times daily as needed. For anxiety.   omeprazole (PRILOSEC) 20 MG capsule, TAKE 1 CAPSULE BY MOUTH EVERY DAY   ONE TOUCH ULTRA TEST test strip, USE 1 STRIP TWICE DAILY DX CODE E11.65   tiZANidine (ZANAFLEX) 2 MG tablet, TAKE 1 TABLET BY MOUTH EVERY 8 HOURS AS NEEDED FOR MUSCLE SPASMS   traZODone (DESYREL) 50 MG tablet, TAKE 0.5-1 TABLETS (25-50 MG TOTAL) BY MOUTH AT BEDTIME AS NEEDED FOR SLEEP.   Vitamin D, Ergocalciferol, (DRISDOL) 1.25 MG (50000 UT) CAPS capsule, TAKE 1 CAPSULE BY MOUTH ONE TIME PER WEEK    Past medical history, social, surgical and family history all reviewed in electronic medical record.  No pertanent information unless stated regarding to the chief complaint.   Review of Systems:  No headache, visual changes, nausea, vomiting, diarrhea, constipation, dizziness, abdominal pain, skin rash, fevers, chills, night sweats, weight loss, swollen lymph nodes, body aches, joint swelling,  chest pain, shortness of breath, mood changes. Muscle aches   Objective  Blood pressure (!) 144/78, pulse 92, height 5' 6.5" (1.689 m), weight 186 lb (84.4 kg), SpO2 98 %.   General: No apparent distress alert and oriented x3 mood and affect normal, dressed appropriately.  HEENT: Pupils equal, extraocular movements intact  Respiratory: Patient's speak in full sentences and does not appear short of breath  Cardiovascular: No lower extremity edema, non tender, no erythema  Skin: Warm dry intact with no signs of infection or rash on extremities or on axial skeleton.  Abdomen: Soft nontender  Neuro: Cranial nerves  II through XII are intact, neurovascularly intact in all extremities with 2+ DTRs and 2+ pulses.  Lymph: No lymphadenopathy of posterior or anterior cervical chain or axillae bilaterally.  Gait normal with good balance and coordination.  MSK:  Non tender with full range of motion and good stability and symmetric strength and tone of shoulders, elbows, wrist, hip, knee and ankles bilaterally.  Back Exam:  Inspection: Unremarkable  Motion: Flexion 35 deg, Extension 25 deg, Side Bending to 35 deg bilaterally,  Rotation to 35 deg bilaterally  SLR laying: Negative  XSLR laying: Negative  Palpable tenderness: ttp diffusely paraspinal musculature..  . Corky Sox: tightness noted on right . Sensory change: Gross sensation intact to all lumbar and sacral dermatomes.  Reflexes: 2+ at both patellar tendons, 2+ at achilles tendons, Babinski's downgoing.  Strength at foot  Plantar-flexion: 5/5 Dorsi-flexion: 5/5 Eversion: 5/5 Inversion: 5/5  Leg strength  Quad: 5/5 Hamstring: 5/5 Hip flexor: 5/5 Hip abductors: 5/5     Back - Normal skin, Spine with normal alignment and no deformity.  No tenderness to vertebral process palpation.  Paraspinous muscles are not tender and without spasm.   Range of motion is full at neck and lumbar sacral regions   Osteopathic findings C2 flexed rotated and side bent right C6 flexed rotated and side bent left T3 extended rotated and side bent right inhaled third rib T9 extended rotated and side bent left L2 flexed rotated and side bent right Sacrum right on right    Impression and Recommendations:     This case required medical decision making of moderate complexity. The above documentation has been reviewed and is accurate and complete Lyndal Pulley, DO       Note: This dictation was prepared with Dragon dictation along with smaller phrase technology. Any transcriptional errors that result from this process are unintentional.

## 2019-04-25 NOTE — Assessment & Plan Note (Signed)
Decision today to treat with OMT was based on Physical Exam  After verbal consent patient was treated with HVLA, ME, FPR techniques in cervical, thoracic, rib lumbar and sacral areas  Patient tolerated the procedure well with improvement in symptoms  Patient given exercises, stretches and lifestyle modifications  See medications in patient instructions if given  Patient will follow up in 4 weeks

## 2019-04-25 NOTE — Patient Instructions (Signed)
Good to see you  Ice is your friend Stay active Check blood pressure daily  You should do well  Stay safe  See me again in 5-6 weeks

## 2019-04-25 NOTE — Assessment & Plan Note (Signed)
Stable overall.  Discussed HEP

## 2019-05-17 DIAGNOSIS — F4321 Adjustment disorder with depressed mood: Secondary | ICD-10-CM | POA: Diagnosis not present

## 2019-05-18 ENCOUNTER — Encounter: Payer: Self-pay | Admitting: Endocrinology

## 2019-05-19 NOTE — Telephone Encounter (Signed)
Please advise 

## 2019-05-22 ENCOUNTER — Other Ambulatory Visit: Payer: Self-pay

## 2019-05-22 ENCOUNTER — Ambulatory Visit
Admission: RE | Admit: 2019-05-22 | Discharge: 2019-05-22 | Disposition: A | Discharge status: Home / Self Care | Payer: Medicare Other | Source: Ambulatory Visit | Attending: Internal Medicine | Admitting: Internal Medicine

## 2019-05-22 DIAGNOSIS — Z1231 Encounter for screening mammogram for malignant neoplasm of breast: Secondary | ICD-10-CM | POA: Diagnosis not present

## 2019-05-30 ENCOUNTER — Ambulatory Visit (INDEPENDENT_AMBULATORY_CARE_PROVIDER_SITE_OTHER): Payer: Medicare Other | Admitting: Family Medicine

## 2019-05-30 ENCOUNTER — Encounter: Payer: Self-pay | Admitting: Family Medicine

## 2019-05-30 ENCOUNTER — Other Ambulatory Visit: Payer: Self-pay

## 2019-05-30 VITALS — BP 140/80 | HR 82 | Ht 66.5 in | Wt 187.0 lb

## 2019-05-30 DIAGNOSIS — M999 Biomechanical lesion, unspecified: Secondary | ICD-10-CM

## 2019-05-30 DIAGNOSIS — M797 Fibromyalgia: Secondary | ICD-10-CM

## 2019-05-30 NOTE — Assessment & Plan Note (Signed)
Decision today to treat with OMT was based on Physical Exam  After verbal consent patient was treated with HVLA, ME, FPR techniques in cervical, thoracic, rib,  lumbar and sacral areas  Patient tolerated the procedure well with improvement in symptoms  Patient given exercises, stretches and lifestyle modifications  See medications in patient instructions if given  Patient will follow up in 4-8 weeks 

## 2019-05-30 NOTE — Assessment & Plan Note (Signed)
Symptoms patient does have significant amount of pain out of proportion.  Also does have known radicular symptoms from time to time.  Responding well to manipulation.  Discussed icing regimen and home exercise.  Follow-up again in 4 to 8 weeks

## 2019-05-30 NOTE — Progress Notes (Signed)
[name] [place], [place] [address] Phone: [phone] Subjective:   I Christine [name] am serving as a Education administrator for Dr. Hulan Saas.   CC: neck and back pain  [id number]  Christine [name] is a 68 y.o. female coming in with complaint of back pain. States that her neck is painful.   Patient did have back pain and neck pain.  Discussed with patient overall stressful exercises and icing regimen.  Increasing stressors  Feels that is contributing to some of the discomfort and pain.  More tightness and more headaches recently.  Patient's significant other was in the hospital for an extended amount of time and is finally home.    Past Medical History:  Diagnosis Date  . Allergic rhinitis   . Diabetes mellitus   . History of recurrent UTIs    [name]  . HTN (hypertension)   . Hyperlipidemia   . Migraine headache    quiescent  . Sleep disorder    [name] May  . TIA (transient ischemic attack)    Past Surgical History:  Procedure Laterality Date  . COLONOSCOPY     Dr Carlean [name]; hemorrhoids  . CORONARY ANGIOPLASTY  1997   for chest pain- negative   . POLYPECTOMY  2002   benign, hyperplastic polyp; rectal bleeding 2008  . TONSILLECTOMY    . TOTAL ABDOMINAL HYSTERECTOMY  1983   BSO for endometriosis  . WISDOM TOOTH EXTRACTION     Social History   Socioeconomic History  . Marital status: Married    Spouse name: Not on file  . Number of children: Not on file  . Years of education: Not on file  . Highest education level: Not on file  Occupational History  . Occupation: Designer, television/film set: [employer]  Social Needs  . Financial resource strain: Not on file  . Food insecurity    Worry: Not on file    Inability: Not on file  . Transportation needs    Medical: Not on file    Non-medical: Not on file  Tobacco Use  . Smoking status: Never Smoker  . Smokeless tobacco: Never Used  Substance and  Sexual Activity  . Alcohol use: No  . Drug use: No  . Sexual activity: Not on file  Lifestyle  . Physical activity    Days per week: Not on file    Minutes per session: Not on file  . Stress: Not on file  Relationships  . Social Herbalist on phone: Not on file    Gets together: Not on file    Attends religious service: Not on file    Active member of club or organization: Not on file    Attends meetings of clubs or organizations: Not on file    Relationship status: Not on file  Other Topics Concern  . Not on file  Social History Narrative   Regular exercise- no    Allergies  Allergen Reactions  . Vioxx [Rofecoxib] Other (See Comments)    Excess BP which caused TIA   . Prednisone Other (See Comments)    Mental status changes No associated rash or fever  . Colesevelam Other (See Comments)    Leg Pain   Family History  Problem Relation Age of Onset  . Colon cancer Maternal Aunt   . Diabetes Paternal Uncle   . Heart attack Father 39  . COPD Mother   . Lung  cancer Brother        smoker  . Mental illness Other        niece, committed suicide.  [name] Heart attack Other        brother X 2; @ 7 & 65  . Stroke Neg Hx     Current Outpatient Medications (Endocrine & Metabolic):  [name]  Insulin Glargine (BASAGLAR KWIKPEN) 100 UNIT/ML SOPN, Inject 0.2 mLs (20 Units total) into the skin every morning. .  pioglitazone (ACTOS) 15 MG tablet, Take 1 tablet (15 mg total) by mouth daily.  Current Outpatient Medications (Cardiovascular):  .  atorvastatin (LIPITOR) 40 MG tablet, TAKE 1 TABLET BY MOUTH AT 6 PM .  losartan (COZAAR) 100 MG tablet, Take 1 tablet (100 mg total) by mouth daily.  Current Outpatient Medications (Respiratory):  .  promethazine (PHENERGAN) 12.5 MG tablet, Take 1 tablet (12.5 mg total) by mouth every 8 (eight) hours as needed for nausea or vomiting.  Current Outpatient Medications (Analgesics):  .  aspirin 81 MG tablet, Take 81 mg by mouth daily.   .   naproxen (NAPROSYN) 500 MG tablet, TAKE 1 TABLET (500 MG TOTAL) BY MOUTH 2 (TWO) TIMES DAILY WITH A MEAL. [name]  traMADol (ULTRAM) 50 MG tablet, Take 1 tablet (50 mg total) by mouth every 6 (six) hours as needed.   Current Outpatient Medications (Other):  [name]  BD PEN NEEDLE NANO U/F 32G X 4 MM MISC, USE ONCE A DAY AS DIRECTED .  DULoxetine (CYMBALTA) 60 MG capsule, Take 90 mg by mouth daily.  .  Lancets (ONETOUCH ULTRASOFT) lancets, Check blood sugar once daily. Dx code: 250.00 .  LORazepam (ATIVAN) 0.5 MG tablet, Take 0.5 mg by mouth 2 (two) times daily as needed. For anxiety. [name]  omeprazole (PRILOSEC) 20 MG capsule, TAKE 1 CAPSULE BY MOUTH EVERY DAY .  ONE TOUCH ULTRA TEST test strip, USE 1 STRIP TWICE DAILY DX CODE E11.65 .  tiZANidine (ZANAFLEX) 2 MG tablet, TAKE 1 TABLET BY MOUTH EVERY 8 HOURS AS NEEDED FOR MUSCLE SPASMS .  traZODone (DESYREL) 50 MG tablet, TAKE 0.5-1 TABLETS (25-50 MG TOTAL) BY MOUTH AT BEDTIME AS NEEDED FOR SLEEP. .  Vitamin D, Ergocalciferol, (DRISDOL) 1.25 MG (50000 UT) CAPS capsule, TAKE 1 CAPSULE BY MOUTH ONE TIME PER WEEK    Past medical history, social, surgical and family history all reviewed in electronic medical record.  No pertanent information unless stated regarding to the chief complaint.   Review of Systems:  No  visual changes, nausea, vomiting, diarrhea, constipation, dizziness, abdominal pain, skin rash, fevers, chills, night sweats, weight loss, swollen lymph nodes,  chest pain, shortness of breath, mood changes.  Positive muscle aches, body aches, headache  Objective  Blood pressure 140/80, pulse 82, height 5' 6.5" (1.689 m), weight 187 lb (84.8 kg), SpO2 95 %.     General: No apparent distress alert and oriented x3 mood and affect normal, dressed appropriately.  HEENT: Pupils equal, extraocular movements intact  Respiratory: Patient's speak in full sentences and does not appear short of breath  Cardiovascular: No lower extremity edema, non tender, no  erythema  Skin: Warm dry intact with no signs of infection or rash on extremities or on axial skeleton.  Abdomen: Soft nontender  Neuro: Cranial nerves II through XII are intact, neurovascularly intact in all extremities with 2+ DTRs and 2+ pulses.  Lymph: No lymphadenopathy of posterior or anterior cervical chain or axillae bilaterally.  Gait normal with good balance and coordination.  MSK:  Non tender with full range of motion and good stability and symmetric strength and tone of shoulders, elbows, wrist, hip, knee and ankles bilaterally.  Neck: Inspection mild loss of lordosis increased kyphosis of the upper thoracic. No palpable stepoffs. Negative Spurling's maneuver. Limited range of motion especially with side bending Grip strength and sensation normal in bilateral hands Strength good C4 to T1 distribution No sensory change to C4 to T1 Negative Hoffman sign bilaterally Reflexes normal Tightness of the trapezius bilaterally  Back Exam:  Inspection: Mild loss of lordosis Motion: Flexion 35 deg, Extension 25 deg, Side Bending to 45 deg bilaterally,  Rotation to 45 deg bilaterally  SLR laying: Negative  XSLR laying: Negative  Palpable tenderness: Tender to palpation of the paraspinal musculature lumbar spine right greater than left. FABER: Tightness bilaterally. Sensory change: Gross sensation intact to all lumbar and sacral dermatomes.  Reflexes: 2+ at both patellar tendons, 2+ at achilles tendons, Babinski's downgoing.  Strength at foot  Plantar-flexion: 5/5 Dorsi-flexion: 5/5 Eversion: 5/5 Inversion: 5/5  Leg strength  Quad: 5/5 Hamstring: 5/5 Hip flexor: 5/5 Hip abductors: 5/5  Gait unremarkable.   Osteopathic findings  C2 flexed rotated and side bent right C6 flexed rotated and side bent left T3 extended rotated and side bent right inhaled third rib T9 extended rotated and side bent left L2 flexed rotated and side bent right Sacrum right on right    Impression and  Recommendations:     This case required medical decision making of moderate complexity. The above documentation has been reviewed and is accurate and complete Lyndal Pulley, DO       Note: This dictation was prepared with Dragon dictation along with smaller phrase technology. Any transcriptional errors that result from this process are unintentional.

## 2019-05-30 NOTE — Patient Instructions (Signed)
Good to see you.  Keep it up See me again in 4 weeks

## 2019-06-07 ENCOUNTER — Encounter: Payer: Self-pay | Admitting: Family Medicine

## 2019-06-21 DIAGNOSIS — F4321 Adjustment disorder with depressed mood: Secondary | ICD-10-CM | POA: Diagnosis not present

## 2019-06-27 ENCOUNTER — Encounter: Payer: Self-pay | Admitting: Family Medicine

## 2019-06-27 ENCOUNTER — Ambulatory Visit (INDEPENDENT_AMBULATORY_CARE_PROVIDER_SITE_OTHER): Payer: Medicare Other | Admitting: Family Medicine

## 2019-06-27 ENCOUNTER — Other Ambulatory Visit: Payer: Self-pay | Admitting: Family Medicine

## 2019-06-27 ENCOUNTER — Other Ambulatory Visit: Payer: Self-pay

## 2019-06-27 VITALS — BP 138/84 | HR 89 | Ht 66.5 in | Wt 187.0 lb

## 2019-06-27 DIAGNOSIS — M999 Biomechanical lesion, unspecified: Secondary | ICD-10-CM

## 2019-06-27 DIAGNOSIS — M5416 Radiculopathy, lumbar region: Secondary | ICD-10-CM

## 2019-06-27 NOTE — Assessment & Plan Note (Signed)
Decision today to treat with OMT was based on Physical Exam  After verbal consent patient was treated with HVLA, ME, FPR techniques in cervical, thoracic, rib lumbar and sacral areas  Patient tolerated the procedure well with improvement in symptoms  Patient given exercises, stretches and lifestyle modifications  See medications in patient instructions if given  Patient will follow up in 4-8 weeks 

## 2019-06-27 NOTE — Assessment & Plan Note (Signed)
Patient is doing reevaluated with ultrasound.  Discussed posture ergonomics.  Discussed which activities.  Continue the same medications at this time.  No significant changes.  Patient is somewhat stressed with current pain.  Patient will follow-up again in 4 to 8 weeks

## 2019-06-27 NOTE — Progress Notes (Signed)
Christine [name] Phone: [phone] Subjective:   I Christine [name] am serving as a Education administrator for Dr. Hulan Saas.   CC: Back pain and neck pain follow-up  [id number]  Christine [name] is a 68 y.o. female coming in with complaint of back pain. States she is doing well.   Discussed posture and ergonomics which she has been doing relatively well.  Patient has had some increasing stress.  Had some tightness in her neck not too long ago.  Seems to be improving a little bit but more at this time now.     Past Medical History:  Diagnosis Date  . Allergic rhinitis   . Diabetes mellitus   . History of recurrent UTIs    [name]  . HTN (hypertension)   . Hyperlipidemia   . Migraine headache    quiescent  . Sleep disorder    [name] May  . TIA (transient ischemic attack)    Past Surgical History:  Procedure Laterality Date  . COLONOSCOPY     Dr Carlean [name]; hemorrhoids  . CORONARY ANGIOPLASTY  1997   for chest pain- negative   . POLYPECTOMY  2002   benign, hyperplastic polyp; rectal bleeding 2008  . TONSILLECTOMY    . TOTAL ABDOMINAL HYSTERECTOMY  1983   BSO for endometriosis  . WISDOM TOOTH EXTRACTION     Social History   Socioeconomic History  . Marital status: Married    Spouse name: Not on file  . Number of children: Not on file  . Years of education: Not on file  . Highest education level: Not on file  Occupational History  . Occupation: Designer, television/film set: [employer]  Social Needs  . Financial resource strain: Not on file  . Food insecurity    Worry: Not on file    Inability: Not on file  . Transportation needs    Medical: Not on file    Non-medical: Not on file  Tobacco Use  . Smoking status: Never Smoker  . Smokeless tobacco: Never Used  Substance and Sexual Activity  . Alcohol use: No  . Drug use: No  . Sexual activity: Not on file  Lifestyle  .  Physical activity    Days per week: Not on file    Minutes per session: Not on file  . Stress: Not on file  Relationships  . Social Herbalist on phone: Not on file    Gets together: Not on file    Attends religious service: Not on file    Active member of club or organization: Not on file    Attends meetings of clubs or organizations: Not on file    Relationship status: Not on file  Other Topics Concern  . Not on file  Social History Narrative   Regular exercise- no    Allergies  Allergen Reactions  . Vioxx [Rofecoxib] Other (See Comments)    Excess BP which caused TIA   . Prednisone Other (See Comments)    Mental status changes No associated rash or fever  . Colesevelam Other (See Comments)    Leg Pain   Family History  Problem Relation Age of Onset  . Colon cancer Maternal Aunt   . Diabetes Paternal Uncle   . Heart attack Father 73  . COPD Mother   . Lung cancer Brother        smoker  . Mental  illness Other        niece, committed suicide.  [name] Heart attack Other        brother X 2; @ 82 & 36  . Stroke Neg Hx     Current Outpatient Medications (Endocrine & Metabolic):  [name]  Insulin Glargine (BASAGLAR KWIKPEN) 100 UNIT/ML SOPN, Inject 0.2 mLs (20 Units total) into the skin every morning. .  pioglitazone (ACTOS) 15 MG tablet, Take 1 tablet (15 mg total) by mouth daily.  Current Outpatient Medications (Cardiovascular):  .  atorvastatin (LIPITOR) 40 MG tablet, TAKE 1 TABLET BY MOUTH AT 6 PM .  losartan (COZAAR) 100 MG tablet, Take 1 tablet (100 mg total) by mouth daily.  Current Outpatient Medications (Respiratory):  .  promethazine (PHENERGAN) 12.5 MG tablet, Take 1 tablet (12.5 mg total) by mouth every 8 (eight) hours as needed for nausea or vomiting.  Current Outpatient Medications (Analgesics):  .  aspirin 81 MG tablet, Take 81 mg by mouth daily.   .  naproxen (NAPROSYN) 500 MG tablet, TAKE 1 TABLET (500 MG TOTAL) BY MOUTH 2 (TWO) TIMES DAILY WITH A  MEAL. [name]  traMADol (ULTRAM) 50 MG tablet, Take 1 tablet (50 mg total) by mouth every 6 (six) hours as needed.   Current Outpatient Medications (Other):  [name]  BD PEN NEEDLE NANO U/F 32G X 4 MM MISC, USE ONCE A DAY AS DIRECTED .  DULoxetine (CYMBALTA) 60 MG capsule, Take 90 mg by mouth daily.  .  Lancets (ONETOUCH ULTRASOFT) lancets, Check blood sugar once daily. Dx code: 250.00 .  LORazepam (ATIVAN) 0.5 MG tablet, Take 0.5 mg by mouth 2 (two) times daily as needed. For anxiety. [name]  omeprazole (PRILOSEC) 20 MG capsule, TAKE 1 CAPSULE BY MOUTH EVERY DAY .  ONE TOUCH ULTRA TEST test strip, USE 1 STRIP TWICE DAILY DX CODE E11.65 .  tiZANidine (ZANAFLEX) 2 MG tablet, TAKE 1 TABLET BY MOUTH EVERY 8 HOURS AS NEEDED FOR MUSCLE SPASMS .  traZODone (DESYREL) 50 MG tablet, TAKE 0.5-1 TABLETS (25-50 MG TOTAL) BY MOUTH AT BEDTIME AS NEEDED FOR SLEEP. .  Vitamin D, Ergocalciferol, (DRISDOL) 1.25 MG (50000 UT) CAPS capsule, TAKE 1 CAPSULE BY MOUTH ONE TIME PER WEEK    Past medical history, social, surgical and family history all reviewed in electronic medical record.  No pertanent information unless stated regarding to the chief complaint.   Review of Systems:  No headache, visual changes, nausea, vomiting, diarrhea, constipation, dizziness, abdominal pain, skin rash, fevers, chills, night sweats, weight loss, swollen lymph nodes, body aches, joint swelling, muscle aches, chest pain, shortness of breath, mood changes.   Objective  Blood pressure 138/84, pulse 89, height 5' 6.5" (1.689 m), weight 187 lb (84.8 kg), SpO2 97 %.    General: No apparent distress alert and oriented x3 mood and affect normal, dressed appropriately.  HEENT: Pupils equal, extraocular movements intact  Respiratory: Patient's speak in full sentences and does not appear short of breath  Cardiovascular: No lower extremity edema, non tender, no erythema  Skin: Warm dry intact with no signs of infection or rash on extremities or on axial  skeleton.  Abdomen: Soft nontender  Neuro: Cranial nerves II through XII are intact, neurovascularly intact in all extremities with 2+ DTRs and 2+ pulses.  Lymph: No lymphadenopathy of posterior or anterior cervical chain or axillae bilaterally.  Gait normal with good balance and coordination.  MSK:  Non tender with full range of motion and good stability and symmetric strength and  tone of shoulders, elbows, wrist, hip, knee and ankles bilaterally.  Neck: Inspection mild increasing kyphosis of the upper thoracic spine. No palpable stepoffs. Negative Spurling's maneuver. Tightness with left-sided rotation and side bending Grip strength and sensation normal in bilateral hands Strength good C4 to T1 distribution No sensory change to C4 to T1 Negative Hoffman sign bilaterally Reflexes normal Tightness in the left trapezius  Back Exam:  Inspection: Mild loss of lordosis Motion: Flexion 45 deg, Extension 15 deg, Side Bending to 35 deg bilaterally,  Rotation to 45 deg bilaterally  SLR laying: Negative  XSLR laying: Negative  Palpable tenderness: Diffuse tenderness to palpation in the paraspinal musculature lumbar spine right greater than left. FABER: negative. Sensory change: Gross sensation intact to all lumbar and sacral dermatomes.  Reflexes: 2+ at both patellar tendons, 2+ at achilles tendons, Babinski's downgoing.  Strength at foot  Plantar-flexion: 5/5 Dorsi-flexion: 5/5 Eversion: 5/5 Inversion: 5/5  Leg strength  Quad: 5/5 Hamstring: 5/5 Hip flexor: 5/5 Hip abductors: 5/5  Gait unremarkable.  Osteopathic findings  C6 flexed rotated and side bent left T3 extended rotated and side bent left inhaled third rib T9 extended rotated and side bent left L2 flexed rotated and side bent right Sacrum right on right    Impression and Recommendations:     This case required medical decision making of moderate complexity. The above documentation has been reviewed and is accurate and  complete Lyndal Pulley, DO       Note: This dictation was prepared with Dragon dictation along with smaller phrase technology. Any transcriptional errors that result from this process are unintentional.

## 2019-06-29 ENCOUNTER — Other Ambulatory Visit: Payer: Self-pay

## 2019-06-29 ENCOUNTER — Other Ambulatory Visit: Payer: Self-pay | Admitting: Endocrinology

## 2019-06-29 ENCOUNTER — Telehealth: Payer: Self-pay | Admitting: Endocrinology

## 2019-06-29 DIAGNOSIS — Z794 Long term (current) use of insulin: Secondary | ICD-10-CM

## 2019-06-29 DIAGNOSIS — E1151 Type 2 diabetes mellitus with diabetic peripheral angiopathy without gangrene: Secondary | ICD-10-CM

## 2019-06-29 MED ORDER — BASAGLAR KWIKPEN 100 UNIT/ML ~~LOC~~ SOPN: 30.0000 [IU] | PEN_INJECTOR | SUBCUTANEOUS | 3 refills | Status: DC

## 2019-06-29 MED ORDER — BD PEN NEEDLE NANO U/F 32G X 4 MM MISC: 3 refills | Status: DC

## 2019-06-29 NOTE — Telephone Encounter (Signed)
MEDICATION: Basaglar   PHARMACY:  [facility] on [address]  IS THIS A 90 DAY SUPPLY :   IS PATIENT OUT OF MEDICATION:   IF NOT; HOW MUCH IS LEFT:   LAST APPOINTMENT DATE: @4 /28/2020  NEXT APPOINTMENT DATE:@8 /28/2020  DO WE HAVE YOUR PERMISSION TO LEAVE A DETAILED MESSAGE: YES  OTHER COMMENTS:   Patients dosage was change and her RX needs to be update to reflect new dosage and medication amounts   **Let patient know to contact pharmacy at the end of the day to make sure medication is ready. **  ** Please notify patient to allow 48-72 hours to process**  **Encourage patient to contact the pharmacy for refills or they can request refills through [facility]**

## 2019-06-29 NOTE — Telephone Encounter (Signed)
Insulin Glargine (BASAGLAR KWIKPEN) 100 UNIT/ML SOPN 10 pen 3 06/29/2019    Sig - Route: Inject 0.3 mLs (30 Units total) into the skin every morning. - Subcutaneous   Sent to pharmacy as: Insulin Glargine (BASAGLAR KWIKPEN) 100 UNIT/ML Solution Pen-injector   E-Prescribing Status: Receipt confirmed by pharmacy (06/29/2019 11:35 AM EDT)

## 2019-07-11 ENCOUNTER — Other Ambulatory Visit: Payer: Self-pay | Admitting: Family Medicine

## 2019-07-12 NOTE — Progress Notes (Signed)
Virtual Visit via Video Note The purpose of this virtual visit is to provide medical care while limiting exposure to the novel coronavirus.    Consent was obtained for video visit:  Yes.   Answered questions that patient had about telehealth interaction:  Yes.   I discussed the limitations, risks, security and privacy concerns of performing an evaluation and management service by telemedicine. I also discussed with the patient that there may be a patient responsible charge related to this service. The patient expressed understanding and agreed to proceed.  Pt location: Home Physician Location: Home Name of referring provider:  [name], * I connected with Dennard Nip at patients initiation/request on 07/13/2019 at 10:50 AM EDT by video enabled telemedicine application and verified that I am speaking with the correct person using two identifiers. Pt MRN:  [id number] Pt DOB:  02/21/1951 Video Participants:  Dennard Nip   History of Present Illness:  Christine [name] is a 68 year old left-handed female with cerebrovascular disease, type 2 diabetes, depression and hypertension who follows up for migraines.  UPDATE: Intensity:  5/10 Duration:  2 hours Frequency:  2 to 3 migraines over past 9 months Has dull tension 2 to 3 days a week.  It lasts about an hour.   Treats the migraine with tramadol and promethazine. Frequency of abortive medication: takes Aleve for tension type headache (1 day a week), tramadol (for hip pain) Rescue protocol: Takes promethazine with or without tramadol.  For tension-type headache, naproxen first or Excedrin. Current NSAIDS:  Naproxen 500mg , Advil (for jaw pain) Current analgesics:  Tramadol 50mg  (headache or jaw pain), naproxen (for tension headache) Current triptans:  none Current ergotamine:  none Current anti-emetic:  Promethazine 12.5mg  Current muscle relaxants:  Tizanidine 2mg  Current anti-anxiolytic:  Ativan Current  sleep aide:  trazodone Current Antihypertensive medications:  losartan Current Antidepressant medications:  Cymbalta 60mg  Current Anticonvulsant medications:  none Current anti-CGRP:  none Current Vitamins/Herbal/Supplements:  B12, Fish oil Current Antihistamines/Decongestants:  none Other therapy:  none  Caffeine:  2 diet Cokes a day (sometimes caffeine-free) Diet:  2 colas daily.  Not much water Depression:  no; Anxiety:  Better.   She had some previous stress due to her husband's health.  He is doing better now.   Other pain: jaw pain Sleep hygiene:  Trazodone.  Helps.  For the past several months, she reports having some word-finding issues.  In the middle of a conversation, she may stumble her words because she has trouble recalling what she wants to say and she needs to back track.  It takes only up to a minute before she knows what to say.  It haves about 4 or 5 times a week.  It typically happens when she is tired.  She occasionally forgets why she walked into a room, but quickly remembers.  It is not frequent.  No trouble with performing everyday tasks.  HISTORY: Onset: 2015-2016 Location:Right sided, around the right eye radiating to the temple and maxilla Quality:aching Initial intensity:7/10 Aura:no Prodrome:no Associated symptoms: Nausea, right ptosis, photophobia, some tingling around the eye and maxilla.No visual disturbance Initial Duration:Gradual onset for several hours later in the day.Sometimes wakes her up at night Initial Frequency:2 days per week Triggers/aggravating factors: Cold, jaw pain Relieving factors:Heating pad on jaw Activity:Usually able to function.  She has remote history of migraines up until her 39s or 58s.These headaches are much different.She was evaluated by her dentist who found nothing wrong.She has been told it  was trigeminal neuralgia and was previously treated with low dose of gabapentin, which was  ineffective.  Other prior medications include sumatriptan.  MRI of brain with and without contrast from 01/23/16 was personally revealed and revealed no acute abnormality but did demonstrate chronic right frontal infarct.  Sed Rate 35, CRP negative  Past Medical History: Past Medical History:  Diagnosis Date   Allergic rhinitis    Diabetes mellitus    History of recurrent UTIs    [name]   HTN (hypertension)    Hyperlipidemia    Migraine headache    quiescent   Sleep disorder    [name] May   TIA (transient ischemic attack)     Medications: Outpatient Encounter Medications as of 07/13/2019  Medication Sig Note   aspirin 81 MG tablet Take 81 mg by mouth daily.      atorvastatin (LIPITOR) 40 MG tablet TAKE 1 TABLET BY MOUTH AT 6 PM    DULoxetine (CYMBALTA) 60 MG capsule Take 90 mg by mouth daily.     Insulin Glargine (BASAGLAR KWIKPEN) 100 UNIT/ML SOPN Inject 0.3 mLs (30 Units total) into the skin every morning.    Insulin Pen Needle (BD PEN NEEDLE NANO U/F) 32G X 4 MM MISC USE ONCE A DAY AS DIRECTED    Lancets (ONETOUCH ULTRASOFT) lancets Check blood sugar once daily. Dx code: 250.00    LORazepam (ATIVAN) 0.5 MG tablet Take 0.5 mg by mouth 2 (two) times daily as needed. For anxiety. 11/07/2013: Mr Anabel Bene, NP   losartan (COZAAR) 100 MG tablet Take 1 tablet (100 mg total) by mouth daily.    naproxen (NAPROSYN) 500 MG tablet TAKE 1 TABLET (500 MG TOTAL) BY MOUTH 2 (TWO) TIMES DAILY WITH A MEAL.    omeprazole (PRILOSEC) 20 MG capsule TAKE 1 CAPSULE BY MOUTH EVERY DAY    ONE TOUCH ULTRA TEST test strip USE 1 STRIP TWICE DAILY DX CODE E11.65    pioglitazone (ACTOS) 15 MG tablet Take 1 tablet (15 mg total) by mouth daily.    promethazine (PHENERGAN) 12.5 MG tablet Take 1 tablet (12.5 mg total) by mouth every 8 (eight) hours as needed for nausea or vomiting.    tiZANidine (ZANAFLEX) 2 MG tablet TAKE 1 TABLET BY MOUTH EVERY 8 HOURS AS NEEDED FOR MUSCLE SPASMS      traMADol (ULTRAM) 50 MG tablet TAKE 1 TABLET BY MOUTH EVERY 6 HOURS AS NEEDED    traZODone (DESYREL) 50 MG tablet TAKE 0.5-1 TABLETS (25-50 MG TOTAL) BY MOUTH AT BEDTIME AS NEEDED FOR SLEEP.    Vitamin D, Ergocalciferol, (DRISDOL) 1.25 MG (50000 UT) CAPS capsule TAKE 1 CAPSULE BY MOUTH ONE TIME PER WEEK    No facility-administered encounter medications on file as of 07/13/2019.     Allergies: Allergies  Allergen Reactions   Vioxx [Rofecoxib] Other (See Comments)    Excess BP which caused TIA    Prednisone Other (See Comments)    Mental status changes No associated rash or fever   Colesevelam Other (See Comments)    Leg Pain    Family History: Family History  Problem Relation Age of Onset   Colon cancer Maternal Aunt    Diabetes Paternal Uncle    Heart attack Father 53   COPD Mother    Lung cancer Brother        smoker   Mental illness Other        niece, committed suicide.   Heart attack Other  brother X 2; @ 73 & 18   Stroke Neg Hx     Social History: Social History   Socioeconomic History   Marital status: Married    Spouse name: Not on file   Number of children: Not on file   Years of education: Not on file   Highest education level: Not on file  Occupational History   Occupation: Designer, television/film set: [employer]  Social Needs   Financial resource strain: Not on file   Food insecurity    Worry: Not on file    Inability: Not on file   Transportation needs    Medical: Not on file    Non-medical: Not on file  Tobacco Use   Smoking status: Never Smoker   Smokeless tobacco: Never Used  Substance and Sexual Activity   Alcohol use: No   Drug use: No   Sexual activity: Not on file  Lifestyle   Physical activity    Days per week: Not on file    Minutes per session: Not on file   Stress: Not on file  Relationships   Social connections    Talks on phone: Not on file    Gets together: Not on  file    Attends religious service: Not on file    Active member of club or organization: Not on file    Attends meetings of clubs or organizations: Not on file    Relationship status: Not on file   Intimate partner violence    Fear of current or ex partner: Not on file    Emotionally abused: Not on file    Physically abused: Not on file    Forced sexual activity: Not on file  Other Topics Concern   Not on file  Social History Narrative   Regular exercise- no     Observations/Objective:   Height 5\' 6"  (1.676 m), weight 184 lb (83.5 kg). No acute distress.  Alert and oriented.  Speech fluent and not dysarthric.  Language intact.  Eyes orthophoric on primary gaze.  Face symmetric.  Assessment and Plan:   1.  Migraine without aura, without status migrainosus, not intractable 2.  Episodic tension-type headache, not intractable 3.  Word-finding issues.  1.  She has been doing well, so she may follow up as needed.  Promethazine may be refilled by Dr. Sharlet Salina. 2.  Word-finding issues are likely related to normal aging at this point.  I don't appreciate any red flags.  However, I told her if she becomes concerned then to make an appointment for further evaluation.   Follow Up Instructions:    -I discussed the assessment and treatment plan with the patient. The patient was provided an opportunity to ask questions and all were answered. The patient agreed with the plan and demonstrated an understanding of the instructions.   The patient was advised to call back or seek an in-person evaluation if the symptoms worsen or if the condition fails to improve as anticipated.    Total Time spent in visit with the patient was:  15 minutes  Dudley Major, DO

## 2019-07-13 ENCOUNTER — Other Ambulatory Visit: Payer: Self-pay

## 2019-07-13 ENCOUNTER — Telehealth (INDEPENDENT_AMBULATORY_CARE_PROVIDER_SITE_OTHER): Payer: Medicare Other | Admitting: Neurology

## 2019-07-13 ENCOUNTER — Encounter: Payer: Self-pay | Admitting: Neurology

## 2019-07-13 VITALS — Ht 66.0 in | Wt 184.0 lb

## 2019-07-13 DIAGNOSIS — G44219 Episodic tension-type headache, not intractable: Secondary | ICD-10-CM

## 2019-07-13 DIAGNOSIS — G43009 Migraine without aura, not intractable, without status migrainosus: Secondary | ICD-10-CM

## 2019-08-08 ENCOUNTER — Encounter: Payer: Self-pay | Admitting: Family Medicine

## 2019-08-08 ENCOUNTER — Other Ambulatory Visit: Payer: Self-pay

## 2019-08-08 ENCOUNTER — Ambulatory Visit (INDEPENDENT_AMBULATORY_CARE_PROVIDER_SITE_OTHER): Payer: Medicare Other | Admitting: Family Medicine

## 2019-08-08 VITALS — BP 130/70 | HR 78 | Ht 66.0 in | Wt 187.0 lb

## 2019-08-08 DIAGNOSIS — M999 Biomechanical lesion, unspecified: Secondary | ICD-10-CM | POA: Diagnosis not present

## 2019-08-08 DIAGNOSIS — M5416 Radiculopathy, lumbar region: Secondary | ICD-10-CM | POA: Diagnosis not present

## 2019-08-08 NOTE — Assessment & Plan Note (Signed)
Known degenerative disc disease as well as lumbar radiculopathy.  Continues to have some radicular symptoms but I do believe there is a possibility for more of a fibromyalgia flare.  Toradol and given today.  Discussed home exercise and icing regimen, continue the same medications, patient has tramadol for breakthrough pain, continue the Cymbalta.  Follow-up again in 4 to 8 weeks

## 2019-08-08 NOTE — Progress Notes (Signed)
[name] [address] [place], [place] [address] Phone: [phone] Subjective:   I Christine [name] am serving as a Education administrator for Dr. Hulan Saas.  CC: Back pain follow-up  [id number]  Christine [name] is a 68 y.o. female coming in with complaint of back pain. Patient states that she has had some issues but overall doing well. Back hurting more than usual due to watching over a 70 year old relative.  Patient does have some tightness noted bilaterally right greater than left she states.     Past Medical History:  Diagnosis Date  . Allergic rhinitis   . Diabetes mellitus   . History of recurrent UTIs    [name]  . HTN (hypertension)   . Hyperlipidemia   . Migraine headache    quiescent  . Sleep disorder    [name] May  . TIA (transient ischemic attack)    Past Surgical History:  Procedure Laterality Date  . COLONOSCOPY     Dr Carlean [name]; hemorrhoids  . CORONARY ANGIOPLASTY  1997   for chest pain- negative   . POLYPECTOMY  2002   benign, hyperplastic polyp; rectal bleeding 2008  . TONSILLECTOMY    . TOTAL ABDOMINAL HYSTERECTOMY  1983   BSO for endometriosis  . WISDOM TOOTH EXTRACTION     Social History   Socioeconomic History  . Marital status: Married    Spouse name: Not on file  . Number of children: Not on file  . Years of education: Not on file  . Highest education level: Not on file  Occupational History  . Occupation: Designer, television/film set: [employer]  Social Needs  . Financial resource strain: Not on file  . Food insecurity    Worry: Not on file    Inability: Not on file  . Transportation needs    Medical: Not on file    Non-medical: Not on file  Tobacco Use  . Smoking status: Never Smoker  . Smokeless tobacco: Never Used  Substance and Sexual Activity  . Alcohol use: No  . Drug use: No  . Sexual activity: Not on file  Lifestyle  . Physical activity    Days per week: Not on file     Minutes per session: Not on file  . Stress: Not on file  Relationships  . Social Herbalist on phone: Not on file    Gets together: Not on file    Attends religious service: Not on file    Active member of club or organization: Not on file    Attends meetings of clubs or organizations: Not on file    Relationship status: Not on file  Other Topics Concern  . Not on file  Social History Narrative   Regular exercise- no    Allergies  Allergen Reactions  . Vioxx [Rofecoxib] Other (See Comments)    Excess BP which caused TIA   . Prednisone Other (See Comments)    Mental status changes No associated rash or fever  . Colesevelam Other (See Comments)    Leg Pain   Family History  Problem Relation Age of Onset  . Colon cancer Maternal Aunt   . Diabetes Paternal Uncle   . Heart attack Father 74  . COPD Mother   . Lung cancer Brother        smoker  . Mental illness Other        niece, committed suicide.  [name]  Heart attack Other        brother X 2; @ 46 & 47  . Stroke Neg Hx     Current Outpatient Medications (Endocrine & Metabolic):  [name]  Insulin Glargine (BASAGLAR KWIKPEN) 100 UNIT/ML SOPN, Inject 0.3 mLs (30 Units total) into the skin every morning. .  pioglitazone (ACTOS) 15 MG tablet, Take 1 tablet (15 mg total) by mouth daily.  Current Outpatient Medications (Cardiovascular):  .  atorvastatin (LIPITOR) 40 MG tablet, TAKE 1 TABLET BY MOUTH AT 6 PM .  losartan (COZAAR) 100 MG tablet, Take 1 tablet (100 mg total) by mouth daily.  Current Outpatient Medications (Respiratory):  .  promethazine (PHENERGAN) 12.5 MG tablet, Take 1 tablet (12.5 mg total) by mouth every 8 (eight) hours as needed for nausea or vomiting.  Current Outpatient Medications (Analgesics):  .  aspirin 81 MG tablet, Take 81 mg by mouth daily.   .  naproxen (NAPROSYN) 500 MG tablet, TAKE 1 TABLET (500 MG TOTAL) BY MOUTH 2 (TWO) TIMES DAILY WITH A MEAL. [name]  traMADol (ULTRAM) 50 MG tablet, TAKE 1  TABLET BY MOUTH EVERY 6 HOURS AS NEEDED   Current Outpatient Medications (Other):  [name]  DULoxetine (CYMBALTA) 60 MG capsule, Take 90 mg by mouth daily.  .  Insulin Pen Needle (BD PEN NEEDLE NANO U/F) 32G X 4 MM MISC, USE ONCE A DAY AS DIRECTED .  Lancets (ONETOUCH ULTRASOFT) lancets, Check blood sugar once daily. Dx code: 250.00 .  LORazepam (ATIVAN) 0.5 MG tablet, Take 0.5 mg by mouth 2 (two) times daily as needed. For anxiety. [name]  omeprazole (PRILOSEC) 20 MG capsule, TAKE 1 CAPSULE BY MOUTH EVERY DAY .  ONE TOUCH ULTRA TEST test strip, USE 1 STRIP TWICE DAILY DX CODE E11.65 .  tiZANidine (ZANAFLEX) 2 MG tablet, TAKE 1 TABLET BY MOUTH EVERY 8 HOURS AS NEEDED FOR MUSCLE SPASMS .  traZODone (DESYREL) 50 MG tablet, TAKE 0.5-1 TABLETS (25-50 MG TOTAL) BY MOUTH AT BEDTIME AS NEEDED FOR SLEEP. .  Vitamin D, Ergocalciferol, (DRISDOL) 1.25 MG (50000 UT) CAPS capsule, TAKE 1 CAPSULE BY MOUTH ONE TIME PER WEEK    Past medical history, social, surgical and family history all reviewed in electronic medical record.  No pertanent information unless stated regarding to the chief complaint.   Review of Systems:  No headache, visual changes, nausea, vomiting, diarrhea, constipation, dizziness, abdominal pain, skin rash, fevers, chills, night sweats, weight loss, swollen lymph nodes,, chest pain, shortness of breath, mood changes.  Positive muscle aches and body aches Objective  Blood pressure 130/70, pulse 78, height 5\' 6"  (1.676 m), weight 187 lb (84.8 kg), SpO2 96 %.   General: No apparent distress alert and oriented x3 mood and affect normal, dressed appropriately.  HEENT: Pupils equal, extraocular movements intact  Respiratory: Patient's speak in full sentences and does not appear short of breath  Cardiovascular: No lower extremity edema, non tender, no erythema  Skin: Warm dry intact with no signs of infection or rash on extremities or on axial skeleton.  Abdomen: Soft nontender  Neuro: Cranial nerves  II through XII are intact, neurovascularly intact in all extremities with 2+ DTRs and 2+ pulses.  Lymph: No lymphadenopathy of posterior or anterior cervical chain or axillae bilaterally.  Gait normal with good balance and coordination.  MSK:  tender with full range of motion and good stability and symmetric strength and tone of shoulders, elbows, wrist, hip, knee and ankles bilaterally.   Back exam does have some loss  of lordosis, however pain over the sacroiliac joints bilaterally right greater than left, positive Faber test.  Negative straight leg test.  Tightness of the Faber test bilaterally  Osteopathic findings  C4 flexed rotated and side bent left C7 flexed rotated and side bent left T3 extended rotated and side bent right inhaled third rib T6 extended rotated and side bent left L2 flexed rotated and side bent right Sacrum right on right      Impression and Recommendations:     This case required medical decision making of moderate complexity. The above documentation has been reviewed and is accurate and complete Lyndal Pulley, DO       Note: This dictation was prepared with Dragon dictation along with smaller phrase technology. Any transcriptional errors that result from this process are unintentional.

## 2019-08-08 NOTE — Patient Instructions (Signed)
Good to see you See me again in 4 weeks

## 2019-08-08 NOTE — Assessment & Plan Note (Signed)
Decision today to treat with OMT was based on Physical Exam  After verbal consent patient was treated with HVLA, ME, FPR techniques in cervical, thoracic, rib, lumbar and sacral areas  Patient tolerated the procedure well with improvement in symptoms  Patient given exercises, stretches and lifestyle modifications  See medications in patient instructions if given  Patient will follow up in 3-6 weeks 

## 2019-08-10 ENCOUNTER — Encounter: Payer: Self-pay | Admitting: Family Medicine

## 2019-08-11 ENCOUNTER — Other Ambulatory Visit: Payer: Self-pay

## 2019-08-11 ENCOUNTER — Ambulatory Visit (INDEPENDENT_AMBULATORY_CARE_PROVIDER_SITE_OTHER): Payer: Medicare Other | Admitting: Endocrinology

## 2019-08-11 ENCOUNTER — Encounter: Payer: Self-pay | Admitting: Endocrinology

## 2019-08-11 VITALS — BP 130/70 | HR 95 | Ht 66.0 in | Wt 185.8 lb

## 2019-08-11 DIAGNOSIS — Z794 Long term (current) use of insulin: Secondary | ICD-10-CM

## 2019-08-11 DIAGNOSIS — E1151 Type 2 diabetes mellitus with diabetic peripheral angiopathy without gangrene: Secondary | ICD-10-CM

## 2019-08-11 DIAGNOSIS — Z23 Encounter for immunization: Secondary | ICD-10-CM | POA: Diagnosis not present

## 2019-08-11 LAB — POCT GLYCOSYLATED HEMOGLOBIN (HGB A1C): Hemoglobin A1C: 7.6 % — AB (ref 4.0–5.6)

## 2019-08-11 NOTE — Patient Instructions (Addendum)
check your blood sugar twice a day.  vary the time of day when you check, between before the 3 meals, and at bedtime.  also check if you have symptoms of your blood sugar being too high or too low.  please keep a record of the readings and bring it to your next appointment here.  You can write it on any piece of paper.  please call us sooner if your blood sugar goes below 70, or if you have a lot of readings over 200.   Please continue the same medications.  Please come back for a follow-up appointment in 3-5 months.

## 2019-08-11 NOTE — Progress Notes (Signed)
Subjective:    Patient ID: Christine [name], female    DOB: 09-04-1951, 68 y.o.   MRN: [id number]  HPI Pt returns for f/u of diabetes mellitus: DM type: Insulin-requiring type 2 Dx'ed: [id number] Complications: TIA Therapy: insulin since 2014, and pioglitizone.   GDM: never. DKA: never.  Severe hypoglycemia: never.  Pancreatitis: never.   Other: She declines multiple daily injections; pioglitizone is for NASH; a trial to convert back to oral rx in 2017 was unsuccessful.  Interval history: she brings a record of her cbg's which I have reviewed today.  cbg's vary from 80-174.  She checks fasting only.  There is no trend throughout the day.  pt states she feels well in general.   Past Medical History:  Diagnosis Date  . Allergic rhinitis   . Diabetes mellitus   . History of recurrent UTIs    [name]  . HTN (hypertension)   . Hyperlipidemia   . Migraine headache    quiescent  . Sleep disorder    [name] May  . TIA (transient ischemic attack)     Past Surgical History:  Procedure Laterality Date  . COLONOSCOPY     Dr Carlean [name]; hemorrhoids  . CORONARY ANGIOPLASTY  1997   for chest pain- negative   . POLYPECTOMY  2002   benign, hyperplastic polyp; rectal bleeding 2008  . TONSILLECTOMY    . TOTAL ABDOMINAL HYSTERECTOMY  1983   BSO for endometriosis  . WISDOM TOOTH EXTRACTION      Social History   Socioeconomic History  . Marital status: Married    Spouse name: Not on file  . Number of children: Not on file  . Years of education: Not on file  . Highest education level: Not on file  Occupational History  . Occupation: Designer, television/film set: [employer]  Social Needs  . Financial resource strain: Not on file  . Food insecurity    Worry: Not on file    Inability: Not on file  . Transportation needs    Medical: Not on file    Non-medical: Not on file  Tobacco Use  . Smoking status: Never Smoker  . Smokeless tobacco: Never Used   Substance and Sexual Activity  . Alcohol use: No  . Drug use: No  . Sexual activity: Not on file  Lifestyle  . Physical activity    Days per week: Not on file    Minutes per session: Not on file  . Stress: Not on file  Relationships  . Social Herbalist on phone: Not on file    Gets together: Not on file    Attends religious service: Not on file    Active member of club or organization: Not on file    Attends meetings of clubs or organizations: Not on file    Relationship status: Not on file  . Intimate partner violence    Fear of current or ex partner: Not on file    Emotionally abused: Not on file    Physically abused: Not on file    Forced sexual activity: Not on file  Other Topics Concern  . Not on file  Social History Narrative   Regular exercise- no     Current Outpatient Medications on File Prior to Visit  Medication Sig Dispense Refill  . aspirin 81 MG tablet Take 81 mg by mouth daily.      [name] atorvastatin (LIPITOR) 40 MG tablet TAKE 1 TABLET  BY MOUTH AT 6 PM 90 tablet 3  . DULoxetine (CYMBALTA) 60 MG capsule Take 90 mg by mouth daily.   4  . Insulin Glargine (BASAGLAR KWIKPEN) 100 UNIT/ML SOPN Inject 0.3 mLs (30 Units total) into the skin every morning. 10 pen 3  . Insulin Pen Needle (BD PEN NEEDLE NANO U/F) 32G X 4 MM MISC USE ONCE A DAY AS DIRECTED 100 each 3  . Lancets (ONETOUCH ULTRASOFT) lancets Check blood sugar once daily. Dx code: 250.00 100 each 12  . LORazepam (ATIVAN) 0.5 MG tablet Take 0.5 mg by mouth 2 (two) times daily as needed. For anxiety.    [name] losartan (COZAAR) 100 MG tablet Take 1 tablet (100 mg total) by mouth daily. 90 tablet 2  . naproxen (NAPROSYN) 500 MG tablet TAKE 1 TABLET (500 MG TOTAL) BY MOUTH 2 (TWO) TIMES DAILY WITH A MEAL. 180 tablet 1  . omeprazole (PRILOSEC) 20 MG capsule TAKE 1 CAPSULE BY MOUTH EVERY DAY 90 capsule 3  . ONE TOUCH ULTRA TEST test strip USE 1 STRIP TWICE DAILY DX CODE E11.65 100 each 7  . pioglitazone  (ACTOS) 15 MG tablet Take 1 tablet (15 mg total) by mouth daily. 90 tablet 3  . promethazine (PHENERGAN) 12.5 MG tablet Take 1 tablet (12.5 mg total) by mouth every 8 (eight) hours as needed for nausea or vomiting. 20 tablet 8  . tiZANidine (ZANAFLEX) 2 MG tablet TAKE 1 TABLET BY MOUTH EVERY 8 HOURS AS NEEDED FOR MUSCLE SPASMS 60 tablet 1  . traMADol (ULTRAM) 50 MG tablet TAKE 1 TABLET BY MOUTH EVERY 6 HOURS AS NEEDED 20 tablet 0  . traZODone (DESYREL) 50 MG tablet TAKE 0.5-1 TABLETS (25-50 MG TOTAL) BY MOUTH AT BEDTIME AS NEEDED FOR SLEEP. 90 tablet 0  . Vitamin D, Ergocalciferol, (DRISDOL) 1.25 MG (50000 UT) CAPS capsule TAKE 1 CAPSULE BY MOUTH ONE TIME PER WEEK 12 capsule 0   No current facility-administered medications on file prior to visit.     Allergies  Allergen Reactions  . Vioxx [Rofecoxib] Other (See Comments)    Excess BP which caused TIA   . Prednisone Other (See Comments)    Mental status changes No associated rash or fever  . Colesevelam Other (See Comments)    Leg Pain    Family History  Problem Relation Age of Onset  . Colon cancer Maternal Aunt   . Diabetes Paternal Uncle   . Heart attack Father 79  . COPD Mother   . Lung cancer Brother        smoker  . Mental illness Other        niece, committed suicide.  [name] Heart attack Other        brother X 2; @ 27 & 73  . Stroke Neg Hx     BP 130/70 (BP Location: Left Arm, Patient Position: Sitting, Cuff Size: Large)   Pulse 95   Ht 5\' 6"  (1.676 m)   Wt 185 lb 12.8 oz (84.3 kg)   LMP  (LMP Unknown)   SpO2 97%   BMI 29.99 kg/m    Review of Systems She denies hypoglycemia    Objective:   Physical Exam VITAL SIGNS:  See vs page GENERAL: no distress.  Pulses: dorsalis pedis intact bilat.   MSK: no deformity of the feet CV: trace bilat leg edema, and bilat vv's Skin:  no ulcer on the feet.  normal color and temp on the feet. Neuro: sensation is intact to touch on  the feet.   A1c=7.2%  Lab Results   Component Value Date   ALT 18 02/08/2019   AST 15 02/08/2019   ALKPHOS 57 02/08/2019   BILITOT 0.4 02/08/2019       Assessment & Plan:  Insulin-requiring type 2 DM: this is the best control this pt should aim for, given this regimen, which does match insulin to her changing needs throughout the day NASH: well-controlled.  Please continue the same pioglitazone   Patient Instructions  check your blood sugar twice a day.  vary the time of day when you check, between before the 3 meals, and at bedtime.  also check if you have symptoms of your blood sugar being too high or too low.  please keep a record of the readings and bring it to your next appointment here.  You can write it on any piece of paper.  please call us sooner if your blood sugar goes below 70, or if you have a lot of readings over 200.   Please continue the same medications.  Please come back for a follow-up appointment in 3-5 months.

## 2019-08-17 ENCOUNTER — Other Ambulatory Visit: Payer: Self-pay | Admitting: *Deleted

## 2019-08-17 MED ORDER — TIZANIDINE HCL 2 MG PO TABS: ORAL_TABLET | ORAL | 0 refills | Status: DC

## 2019-08-17 NOTE — Telephone Encounter (Signed)
Pt called requesting a refill on zanaflex.  rx sent into pharmacy.

## 2019-09-01 ENCOUNTER — Other Ambulatory Visit: Payer: Self-pay

## 2019-09-01 ENCOUNTER — Encounter (HOSPITAL_BASED_OUTPATIENT_CLINIC_OR_DEPARTMENT_OTHER): Payer: Self-pay | Admitting: Emergency Medicine

## 2019-09-01 ENCOUNTER — Emergency Department (HOSPITAL_BASED_OUTPATIENT_CLINIC_OR_DEPARTMENT_OTHER): Payer: Medicare Other

## 2019-09-01 ENCOUNTER — Emergency Department (HOSPITAL_BASED_OUTPATIENT_CLINIC_OR_DEPARTMENT_OTHER)
Admission: EM | Admit: 2019-09-01 | Discharge: 2019-09-01 | Disposition: A | Discharge status: Home / Self Care | Payer: Medicare Other | Attending: Emergency Medicine | Admitting: Emergency Medicine

## 2019-09-01 DIAGNOSIS — R002 Palpitations: Secondary | ICD-10-CM | POA: Insufficient documentation

## 2019-09-01 DIAGNOSIS — E119 Type 2 diabetes mellitus without complications: Secondary | ICD-10-CM | POA: Insufficient documentation

## 2019-09-01 DIAGNOSIS — Z7982 Long term (current) use of aspirin: Secondary | ICD-10-CM | POA: Insufficient documentation

## 2019-09-01 DIAGNOSIS — I10 Essential (primary) hypertension: Secondary | ICD-10-CM | POA: Insufficient documentation

## 2019-09-01 DIAGNOSIS — Z79899 Other long term (current) drug therapy: Secondary | ICD-10-CM | POA: Insufficient documentation

## 2019-09-01 DIAGNOSIS — Z8673 Personal history of transient ischemic attack (TIA), and cerebral infarction without residual deficits: Secondary | ICD-10-CM | POA: Insufficient documentation

## 2019-09-01 DIAGNOSIS — Z794 Long term (current) use of insulin: Secondary | ICD-10-CM | POA: Insufficient documentation

## 2019-09-01 LAB — URINALYSIS, ROUTINE W REFLEX MICROSCOPIC
Bilirubin Urine: NEGATIVE
Glucose, UA: NEGATIVE mg/dL
Ketones, ur: NEGATIVE mg/dL
Nitrite: NEGATIVE
Protein, ur: NEGATIVE mg/dL
Specific Gravity, Urine: <1.005 — ABNORMAL LOW (ref 1.005–1.030)
pH: 6 (ref 5.0–8.0)

## 2019-09-01 LAB — CBC
HCT: 38.1 % (ref 36.0–46.0)
Hemoglobin: 12.2 g/dL (ref 12.0–15.0)
MCH: 29.3 pg (ref 26.0–34.0)
MCHC: 32 g/dL (ref 30.0–36.0)
MCV: 91.6 fL (ref 80.0–100.0)
Platelets: 218 10*3/uL (ref 150–400)
RBC: 4.16 MIL/uL (ref 3.87–5.11)
RDW: 13.1 % (ref 11.5–15.5)
WBC: 6.2 10*3/uL (ref 4.0–10.5)
nRBC: 0 % (ref 0.0–0.2)

## 2019-09-01 LAB — BASIC METABOLIC PANEL
Anion gap: 7 (ref 5–15)
BUN: 17 mg/dL (ref 8–23)
CO2: 26 mmol/L (ref 22–32)
Calcium: 9.2 mg/dL (ref 8.9–10.3)
Chloride: 104 mmol/L (ref 98–111)
Creatinine, Ser: 0.62 mg/dL (ref 0.44–1.00)
GFR calc Af Amer: >60 mL/min (ref >60)
GFR calc non Af Amer: >60 mL/min (ref >60)
Glucose, Bld: 146 mg/dL — ABNORMAL HIGH (ref 70–99)
Potassium: 3.5 mmol/L (ref 3.5–5.1)
Sodium: 137 mmol/L (ref 135–145)

## 2019-09-01 LAB — URINALYSIS, MICROSCOPIC (REFLEX)

## 2019-09-01 LAB — MAGNESIUM: Magnesium: 1.9 mg/dL (ref 1.7–2.4)

## 2019-09-01 LAB — TROPONIN I (HIGH SENSITIVITY): Troponin I (High Sensitivity): 2 ng/L (ref <18)

## 2019-09-01 NOTE — ED Triage Notes (Signed)
Palpitations x3 days. Not as bad at night. Denies CP with these palpitations.  Sts she had some CP last week but no palpitations at that time.

## 2019-09-01 NOTE — ED Provider Notes (Signed)
[facility] EMERGENCY DEPARTMENT Provider Note   CSN: [id number] Arrival date & time: 09/01/19  1737     History   Chief Complaint Chief Complaint  Patient presents with   Palpitations    HPI Christine [name] is a 68 y.o. female.     The history is provided by the patient and medical records. No language interpreter was used.  Palpitations   Christine [name] is a 68 y.o. female who presents to the Emergency Department complaining of palpitations.  Wednesday afternoon she was sitting on the sofa and she felt like her heart made a hard flip and she had to gasp to get her breath at the time.  She felt a little dizzy at the time.  The episode lasted just a few seconds.  Since that time she has experienced nearly constant palpitations like a flutter sensation.  She took an ativan thinking it may be anxiety with no change in symptoms.  She has associated decreased appetite for the last few days.  She did have chest pressure last week - none currently. Denies sob, diaphoresis, fever, cough, vomiting, dysuria, leg edema/pain.  Has occasional nausea.  Denies covid19 exposures.    Has a hx/o HTN, DM, HPL.  She has a family hx/o CAD - father 39, brother 45s.  Denies tobacco use, alcohol, street drugs.   Past Medical History:  Diagnosis Date   Allergic rhinitis    Diabetes mellitus    History of recurrent UTIs    [name]   HTN (hypertension)    Hyperlipidemia    Migraine headache    quiescent   Sleep disorder    [name] May   TIA (transient ischemic attack)     Patient Active Problem List   Diagnosis Date Noted   Acute bursitis of right shoulder 08/29/2018   Nonallopathic lesion of sacral region 06/27/2018   Subluxation of peroneal tendon of right foot 11/10/2017   Gluteal tendonitis of right buttock 10/19/2017   Lumbar radiculopathy, right 06/23/2017   Closed displaced fracture of fifth metatarsal bone of left foot 03/16/2017   GERD  (gastroesophageal reflux disease) 12/25/2016   Greater trochanteric bursitis of left hip 06/17/2016   Diabetes ([place]) 04/02/2016   Nonallopathic lesion of lumbosacral region 01/15/2016   Greater trochanteric bursitis of right hip 11/27/2015   Routine general medical examination at a health care facility 11/25/2015   Nonallopathic lesion of thoracic region 09/26/2014   Nonallopathic lesion-rib cage 09/26/2014   Nonallopathic lesion of cervical region 07/19/2014   Hx of osteopenia 10/11/2012   Seasonal allergic rhinitis 03/21/2012   Fibromyalgia 08/12/2011   Palpitations 04/15/2011   DEPRESSIVE DISORDER NOT ELSEWHERE CLASSIFIED 03/27/2010   Vitamin D deficiency 05/25/2008   Lumbar radiculopathy 04/11/2008   Essential hypertension 03/14/2008   TRANSIENT ISCHEMIC ATTACKS, HX OF 02/02/2008   Hyperlipidemia associated with type 2 diabetes mellitus ([place]) 01/18/2007    Past Surgical History:  Procedure Laterality Date   COLONOSCOPY     Dr Carlean [name]; hemorrhoids   [place]   for chest pain- negative    POLYPECTOMY  2002   benign, hyperplastic polyp; rectal bleeding 2008   TONSILLECTOMY     TOTAL ABDOMINAL HYSTERECTOMY  1983   BSO for endometriosis   WISDOM TOOTH EXTRACTION       OB History   No obstetric history on file.      Home Medications    Prior to Admission medications   Medication Sig Start Date End Date  Taking? Authorizing Provider  aspirin 81 MG tablet Take 81 mg by mouth daily.      [provider]  atorvastatin (LIPITOR) 40 MG tablet TAKE 1 TABLET BY MOUTH AT 6 PM 02/16/19   [name], MD  DULoxetine (CYMBALTA) 60 MG capsule Take 90 mg by mouth daily.  01/04/18   [provider]  Insulin Glargine (BASAGLAR KWIKPEN) 100 UNIT/ML SOPN Inject 0.3 mLs (30 Units total) into the skin every morning. 06/29/19   Renato Shin, MD  Insulin Pen Needle (BD PEN NEEDLE NANO U/F) 32G X 4 MM MISC USE ONCE A DAY AS  DIRECTED 06/29/19   Renato Shin, MD  Lancets [facility] ULTRASOFT) lancets Check blood sugar once daily. Dx code: 250.00 11/08/13   Hendricks Limes, MD  LORazepam (ATIVAN) 0.5 MG tablet Take 0.5 mg by mouth 2 (two) times daily as needed. For anxiety.    [provider]  losartan (COZAAR) 100 MG tablet Take 1 tablet (100 mg total) by mouth daily. 03/30/19   [name], MD  naproxen (NAPROSYN) 500 MG tablet TAKE 1 TABLET (500 MG TOTAL) BY MOUTH 2 (TWO) TIMES DAILY WITH A MEAL. 01/03/18   Lyndal Pulley, DO  omeprazole (PRILOSEC) 20 MG capsule TAKE 1 CAPSULE BY MOUTH EVERY DAY 03/28/19   [name], MD  ONE [facility] ULTRA TEST test strip USE 1 STRIP TWICE DAILY DX CODE E11.65 08/29/18   Renato Shin, MD  pioglitazone (ACTOS) 15 MG tablet Take 1 tablet (15 mg total) by mouth daily. 01/10/19   Renato Shin, MD  promethazine (PHENERGAN) 12.5 MG tablet Take 1 tablet (12.5 mg total) by mouth every 8 (eight) hours as needed for nausea or vomiting. 10/12/18   Tomi Likens, Adam R, DO  tiZANidine (ZANAFLEX) 2 MG tablet TAKE 1 TABLET BY MOUTH EVERY 8 HOURS AS NEEDED FOR MUSCLE SPASMS 08/17/19   Lyndal Pulley, DO  traMADol (ULTRAM) 50 MG tablet TAKE 1 TABLET BY MOUTH EVERY 6 HOURS AS NEEDED 06/27/19   Lyndal Pulley, DO  traZODone (DESYREL) 50 MG tablet TAKE 0.5-1 TABLETS (25-50 MG TOTAL) BY MOUTH AT BEDTIME AS NEEDED FOR SLEEP. 06/28/18   Lyndal Pulley, DO  Vitamin D, Ergocalciferol, (DRISDOL) 1.25 MG (50000 UT) CAPS capsule TAKE 1 CAPSULE BY MOUTH ONE TIME PER WEEK 07/11/19   Lyndal Pulley, DO    Family History Family History  Problem Relation Age of Onset   Colon cancer Maternal Aunt    Diabetes Paternal Uncle    Heart attack Father 68   COPD Mother    Lung cancer Brother        smoker   Mental illness Other        niece, committed suicide.   Heart attack Other        brother X 2; @ 10 & 33   Stroke Neg Hx     Social History Social History   Tobacco Use    Smoking status: Never Smoker   Smokeless tobacco: Never Used  Substance Use Topics   Alcohol use: No   Drug use: No     Allergies   Vioxx [rofecoxib], Prednisone, and Colesevelam   Review of Systems Review of Systems  Cardiovascular: Positive for palpitations.  All other systems reviewed and are negative.    Physical Exam Updated Vital Signs BP 136/63    Pulse 85    Temp 98.3 F (36.8 C) (Oral)    Resp 14    Ht 5'  6" (1.676 m)    Wt 85 kg    LMP  (LMP Unknown)    SpO2 96%    BMI 30.23 kg/m   Physical Exam Vitals signs and nursing note reviewed.  Constitutional:      Appearance: She is well-developed.  HENT:     Head: Normocephalic and atraumatic.  Cardiovascular:     Rate and Rhythm: Normal rate and regular rhythm.     Heart sounds: No murmur.  Pulmonary:     Effort: Pulmonary effort is normal. No respiratory distress.     Breath sounds: Normal breath sounds.  Abdominal:     Palpations: Abdomen is soft.     Tenderness: There is no abdominal tenderness. There is no guarding or rebound.  Musculoskeletal:        General: No swelling or tenderness.  Skin:    General: Skin is warm and dry.  Neurological:     Mental Status: She is alert and oriented to person, place, and time.  Psychiatric:        Behavior: Behavior normal.      ED Treatments / Results  Labs (all labs ordered are listed, but only abnormal results are displayed) Labs Reviewed  BASIC METABOLIC PANEL - Abnormal; Notable for the following components:      Result Value   Glucose, Bld 146 (*)    All other components within normal limits  URINALYSIS, ROUTINE W REFLEX MICROSCOPIC - Abnormal; Notable for the following components:   Specific Gravity, Urine <1.005 (*)    Hgb urine dipstick TRACE (*)    Leukocytes,Ua TRACE (*)    All other components within normal limits  URINALYSIS, MICROSCOPIC (REFLEX) - Abnormal; Notable for the following components:   Bacteria, UA FEW (*)    All other  components within normal limits  URINE CULTURE  CBC  MAGNESIUM  TROPONIN I (HIGH SENSITIVITY)  TROPONIN I (HIGH SENSITIVITY)    EKG EKG Interpretation  Date/Time:  Friday September 01 2019 17:49:46 EDT Ventricular Rate:  87 PR Interval:    QRS Duration: 96 QT Interval:  385 QTC Calculation: 464 R Axis:   19 Text Interpretation:  Sinus rhythm No significant change since last tracing Confirmed by Quintella Reichert [phone]) on 09/01/2019 5:58:39 PM   Radiology Dg Chest 2 View  Result Date: 09/01/2019 CLINICAL DATA:  Heart palpitations EXAM: CHEST - 2 VIEW COMPARISON:  04/15/2011 FINDINGS: The heart size and mediastinal contours are within normal limits. Both lungs are clear. The visualized skeletal structures are unremarkable. IMPRESSION: No active cardiopulmonary disease. Electronically Signed   By: Davina Poke M.D.   On: 09/01/2019 19:20    Procedures Procedures (including critical care time)  Medications Ordered in ED Medications - No data to display   Initial Impression / Assessment and Plan / ED Course  I have reviewed the triage vital signs and the nursing notes.  Pertinent labs & imaging results that were available during my care of the patient were reviewed by me and considered in my medical decision making (see chart for details).        Pt here for evaluation of palpitations since Wednesday. Pt in NSR on monitor with one PVC during ED stay.  No evidence of significant anemia or electrolyte abnormality.  UA not c/w UTI - will send culture and treat if positive.  Presentation not c/w ACS, PE, life threatening arrhythmia.  D/w pt home care for palpitations.  Discussed outpatient follow up and return precautions.    Final  Clinical Impressions(s) / ED Diagnoses   Final diagnoses:  Palpitations    ED Discharge Orders    None       Quintella Reichert, MD 09/01/19 2306

## 2019-09-01 NOTE — ED Notes (Signed)
Pt on monitor and unable to provide urine at this time

## 2019-09-03 LAB — URINE CULTURE: Culture: >=100000 — AB

## 2019-09-04 NOTE — Progress Notes (Signed)
ED Antimicrobial Stewardship Positive Culture Follow Up   Christine [name] is an 68 y.o. female who presented to [facility] on 09/01/2019 with a chief complaint of  Chief Complaint  Patient presents with  . Palpitations    Recent Results (from the past 720 hour(s))  Urine culture     Status: Abnormal   Collection Time: 09/01/19  7:37 PM   Specimen: Urine, Random  Result Value Ref Range Status   Specimen Description   Final    URINE, RANDOM Performed at [facility], [address] [place], [place] [address]    Special Requests   Final    NONE Performed at [facility], [address] [place], [place] [address]    Culture (A)  Final    >=100,000 COLONIES/mL GROUP B STREP(S.AGALACTIAE)ISOLATED TESTING AGAINST S. AGALACTIAE NOT ROUTINELY PERFORMED DUE TO PREDICTABILITY OF AMP/PEN/VAN SUSCEPTIBILITY. Performed at [facility], [place] [address], [place], [place] [address]    Report Status 09/03/2019 FINAL  Final   [x]  Patient discharged originally without antimicrobial agent and treatment may now be indicated  New antibiotic prescription: Flow Manager to call patient. If patient still experiencing no urinary symptoms (dysuria, frequency, etc.), then no further treatment indicated for asymptomatic bacteriuria. If patient experiencing urinary symptoms, then start amoxicillin 875mg  PO BID x 5 days.  ED Provider: Fransico Meadow, PA-C   Elicia Lamp P 09/04/2019, 10:02 AM Clinical Pharmacist Monday - Friday phone -  [phone] Saturday - Sunday phone - [phone]

## 2019-09-06 ENCOUNTER — Encounter: Payer: Self-pay | Admitting: Family Medicine

## 2019-09-06 ENCOUNTER — Ambulatory Visit (INDEPENDENT_AMBULATORY_CARE_PROVIDER_SITE_OTHER): Payer: Medicare Other | Admitting: Family Medicine

## 2019-09-06 ENCOUNTER — Other Ambulatory Visit: Payer: Self-pay

## 2019-09-06 VITALS — BP 144/80 | HR 91 | Ht 66.0 in | Wt 185.0 lb

## 2019-09-06 DIAGNOSIS — R002 Palpitations: Secondary | ICD-10-CM | POA: Diagnosis not present

## 2019-09-06 DIAGNOSIS — M5416 Radiculopathy, lumbar region: Secondary | ICD-10-CM | POA: Diagnosis not present

## 2019-09-06 DIAGNOSIS — M999 Biomechanical lesion, unspecified: Secondary | ICD-10-CM | POA: Diagnosis not present

## 2019-09-06 NOTE — Assessment & Plan Note (Signed)
History of palpitations that seem to be worsening.  Patient would like a referral to cardiology.  We discussed the possibility of ordering an echocardiogram as well as Dopplers of the carotids.  Patient would like to be referred to Dr. Tamala Julian in cardiology with her husband seen him.

## 2019-09-06 NOTE — Assessment & Plan Note (Signed)
Improvement from last exam.  On exam today patient's lower back seem to be actually fairly unremarkable.

## 2019-09-06 NOTE — Progress Notes (Signed)
[name] [address] [place], [place] [address] Phone: [phone] Subjective:   I Christine [name] am serving as a Education administrator for Dr. Hulan Saas.  I'm seeing this patient by the request  of:    CC:   [id number]  Christine [name] is a 68 y.o. female coming in with complaint of back pain. States she is doing well. States that after Toradol injection she could barely move.  States that it only lasted for 4 to 5 days.  And has not been significantly better since then.  Unfortunately did have some worsening pain recently.  Patient was having more palpitations.  Seen in emergency department.  I reviewed patient's laboratory work-up which was fairly unremarkable.  States that she feels that her heart sometimes goes in and out of rhythm.  Denies true chest pain but sometimes can be associated with significant shortness of breath.  Sometimes with dizziness as well.      Past Medical History:  Diagnosis Date  . Allergic rhinitis   . Diabetes mellitus   . History of recurrent UTIs    [name]  . HTN (hypertension)   . Hyperlipidemia   . Migraine headache    quiescent  . Sleep disorder    [name] May  . TIA (transient ischemic attack)    Past Surgical History:  Procedure Laterality Date  . COLONOSCOPY     Dr Carlean [name]; hemorrhoids  . CORONARY ANGIOPLASTY  1997   for chest pain- negative   . POLYPECTOMY  2002   benign, hyperplastic polyp; rectal bleeding 2008  . TONSILLECTOMY    . TOTAL ABDOMINAL HYSTERECTOMY  1983   BSO for endometriosis  . WISDOM TOOTH EXTRACTION     Social History   Socioeconomic History  . Marital status: Married    Spouse name: Not on file  . Number of children: Not on file  . Years of education: Not on file  . Highest education level: Not on file  Occupational History  . Occupation: Designer, television/film set: [employer]  Social Needs  . Financial resource strain: Not on file  . Food  insecurity    Worry: Not on file    Inability: Not on file  . Transportation needs    Medical: Not on file    Non-medical: Not on file  Tobacco Use  . Smoking status: Never Smoker  . Smokeless tobacco: Never Used  Substance and Sexual Activity  . Alcohol use: No  . Drug use: No  . Sexual activity: Not on file  Lifestyle  . Physical activity    Days per week: Not on file    Minutes per session: Not on file  . Stress: Not on file  Relationships  . Social Herbalist on phone: Not on file    Gets together: Not on file    Attends religious service: Not on file    Active member of club or organization: Not on file    Attends meetings of clubs or organizations: Not on file    Relationship status: Not on file  Other Topics Concern  . Not on file  Social History Narrative   Regular exercise- no    Allergies  Allergen Reactions  . Vioxx [Rofecoxib] Other (See Comments)    Excess BP which caused TIA   . Prednisone Other (See Comments)    Mental status changes No associated rash or fever  . Colesevelam Other (See  Comments)    Leg Pain   Family History  Problem Relation Age of Onset  . Colon cancer Maternal Aunt   . Diabetes Paternal Uncle   . Heart attack Father 34  . COPD Mother   . Lung cancer Brother        smoker  . Mental illness Other        niece, committed suicide.  [name] Heart attack Other        brother X 2; @ 81 & 39  . Stroke Neg Hx     Current Outpatient Medications (Endocrine & Metabolic):  [name]  Insulin Glargine (BASAGLAR KWIKPEN) 100 UNIT/ML SOPN, Inject 0.3 mLs (30 Units total) into the skin every morning. .  pioglitazone (ACTOS) 15 MG tablet, Take 1 tablet (15 mg total) by mouth daily.  Current Outpatient Medications (Cardiovascular):  .  atorvastatin (LIPITOR) 40 MG tablet, TAKE 1 TABLET BY MOUTH AT 6 PM .  losartan (COZAAR) 100 MG tablet, Take 1 tablet (100 mg total) by mouth daily.  Current Outpatient Medications (Respiratory):  .   promethazine (PHENERGAN) 12.5 MG tablet, Take 1 tablet (12.5 mg total) by mouth every 8 (eight) hours as needed for nausea or vomiting.  Current Outpatient Medications (Analgesics):  .  aspirin 81 MG tablet, Take 81 mg by mouth daily.   .  naproxen (NAPROSYN) 500 MG tablet, TAKE 1 TABLET (500 MG TOTAL) BY MOUTH 2 (TWO) TIMES DAILY WITH A MEAL. [name]  traMADol (ULTRAM) 50 MG tablet, TAKE 1 TABLET BY MOUTH EVERY 6 HOURS AS NEEDED   Current Outpatient Medications (Other):  [name]  DULoxetine (CYMBALTA) 60 MG capsule, Take 90 mg by mouth daily.  .  Insulin Pen Needle (BD PEN NEEDLE NANO U/F) 32G X 4 MM MISC, USE ONCE A DAY AS DIRECTED .  Lancets (ONETOUCH ULTRASOFT) lancets, Check blood sugar once daily. Dx code: 250.00 .  LORazepam (ATIVAN) 0.5 MG tablet, Take 0.5 mg by mouth 2 (two) times daily as needed. For anxiety. [name]  omeprazole (PRILOSEC) 20 MG capsule, TAKE 1 CAPSULE BY MOUTH EVERY DAY .  ONE TOUCH ULTRA TEST test strip, USE 1 STRIP TWICE DAILY DX CODE E11.65 .  tiZANidine (ZANAFLEX) 2 MG tablet, TAKE 1 TABLET BY MOUTH EVERY 8 HOURS AS NEEDED FOR MUSCLE SPASMS .  traZODone (DESYREL) 50 MG tablet, TAKE 0.5-1 TABLETS (25-50 MG TOTAL) BY MOUTH AT BEDTIME AS NEEDED FOR SLEEP. .  Vitamin D, Ergocalciferol, (DRISDOL) 1.25 MG (50000 UT) CAPS capsule, TAKE 1 CAPSULE BY MOUTH ONE TIME PER WEEK    Past medical history, social, surgical and family history all reviewed in electronic medical record.  No pertanent information unless stated regarding to the chief complaint.   Review of Systems:  No  visual changes, nausea, vomiting, diarrhea, constipation, dizziness, abdominal pain, skin rash, fevers, chills, night sweats, weight loss, swollen lymph nodes, body aches, joint swelling, chest pain, shortness of breath, mood changes.  Positive headaches, muscle aches  Objective  Blood pressure (!) 144/80, pulse 91, height 5\' 6"  (1.676 m), weight 185 lb (83.9 kg), SpO2 95 %.    General: No apparent distress  alert and oriented x3 mood and affect normal, dressed appropriately.  HEENT: Pupils equal, extraocular movements intact  Respiratory: Patient's speak in full sentences and does not appear short of breath  Cardiovascular: No lower extremity edema, non tender, no erythema regular rate and rhythm with no significant murmur noted today Skin: Warm dry intact with no signs of infection or rash on  extremities or on axial skeleton.  Abdomen: Soft nontender  Neuro: Cranial nerves II through XII are intact, neurovascularly intact in all extremities with 2+ DTRs and 2+ pulses.  Lymph: No lymphadenopathy of posterior or anterior cervical chain or axillae bilaterally.  Gait normal with good balance and coordination.  MSK:  tender with full range of motion and good stability and symmetric strength and tone of shoulders, elbows, wrist, hip, knee and ankles bilaterally.  Patient's neck exam does have some loss of lordosis.  Some tender to palpation in the paraspinal musculature lumbar spine right greater than left.  Patient does have a negative Spurling's.  Mild increase in kyphosis of the upper thoracic spine.  Osteopathic findings C2 flexed rotated and side bent right C4 flexed rotated and side bent left C6 flexed rotated and side bent left T3 extended rotated and side bent right inhaled third rib T9 extended rotated and side bent left L2 flexed rotated and side bent right Sacrum right on right    Impression and Recommendations:     This case required medical decision making of moderate complexity. The above documentation has been reviewed and is accurate and complete Christine Pulley, DO       Note: This dictation was prepared with Dragon dictation along with smaller phrase technology. Any transcriptional errors that result from this process are unintentional.

## 2019-09-06 NOTE — Assessment & Plan Note (Signed)
Decision today to treat with OMT was based on Physical Exam  After verbal consent patient was treated with HVLA, ME, FPR techniques in cervical, thoracic, rib, lumbar and sacral areas  Patient tolerated the procedure well with improvement in symptoms  Patient given exercises, stretches and lifestyle modifications  See medications in patient instructions if given  Patient will follow up in 4-6 weeks 

## 2019-09-06 NOTE — Patient Instructions (Signed)
See me again in 6 weeks 

## 2019-09-15 ENCOUNTER — Encounter: Payer: Self-pay | Admitting: Family Medicine

## 2019-09-15 DIAGNOSIS — F4321 Adjustment disorder with depressed mood: Secondary | ICD-10-CM | POA: Diagnosis not present

## 2019-09-18 NOTE — Progress Notes (Signed)
Cardiology Office Note:    Date:  09/19/2019   ID:  [name], [name] [birth year], MRN [id number]  PCP:  [name], MD  Cardiologist:  No primary care provider on file.   Referring MD: Lyndal Pulley, DO   Chief Complaint  Patient presents with  . Irregular Heart Beat    History of Present Illness:    Christine [name] is a 68 y.o. female with a hx of palpitations. New patient referred by Dr. Creig Hines.   She has anxiety issues.  She sees a Education officer, community.  She began having palpitations 3 weeks ago.  It started feeling as though her heart flipped.  Thereafter she has been having daily palpitations.  At times it feels that her heart races.  She has not fainted.  She denies chest pain and dyspnea.  There is no peripheral edema.    Past Medical History:  Diagnosis Date  . Acute bursitis of right shoulder 08/29/2018   Right shoulder injected in August 29, 2018  . Allergic rhinitis   . Closed displaced fracture of fifth metatarsal bone of left foot 03/16/2017  . DEPRESSIVE DISORDER NOT ELSEWHERE CLASSIFIED 03/27/2010   Qualifier: Diagnosis of  By: [name] MD, [name]   [name], NP , [facility] [place]    . Diabetes ([place]) 04/02/2016  . Diabetes mellitus   . Essential hypertension 03/14/2008   Qualifier: Diagnosis of  By: [name] MD, [name]    . Fibromyalgia 08/12/2011   Diagnosed by [name] , Rheumatologist 2010   . GERD (gastroesophageal reflux disease) 12/25/2016  . Gluteal tendonitis of right buttock 10/19/2017   Injected in October 19, 2017 Injected October 07, 2018  . Greater trochanteric bursitis of left hip 06/17/2016  . Greater trochanteric bursitis of right hip 11/27/2015   Injected 11/27/2015   . History of recurrent UTIs    [name]  . HTN (hypertension)   . Hx of osteopenia 10/11/2012   [facility] DEXA; ordered by [name] Lowest T score: -2.1 @ lumbar spine @ [facility] 11/01/12; decrease of 8.5% vs 05/2009.  There is 9.2% risk over 10 yrs History fracture left elbow @ 4 Fractured left wrist , right elbow and nose post fall July/18/2013. Dr. [name] No FH Osteoporosis No PMH of bisphosphonate therapy (generic Fosamax Rxed but not filled  due to polypharmacy )   . Hyperlipidemia   . Hyperlipidemia associated with type 2 diabetes mellitus ([place]) 01/18/2007   Qualifier: Diagnosis of  By: [name]  With DM LDL goal = < 100, ideally < 70. Mi in father @ 83; 2 brothers @ 79 & 37    . Lumbar radiculopathy 04/11/2008   Qualifier: Diagnosis of  By: [name] MD, Erick Blinks well to epidural 01/12/2017  repeat epidural given May 2018  . Migraine headache    quiescent  . Nonallopathic lesion of cervical region 07/19/2014  . Nonallopathic lesion of lumbosacral region 01/15/2016  . Nonallopathic lesion of sacral region 06/27/2018  . Nonallopathic lesion-rib cage 09/26/2014  . Palpitations 04/15/2011  . Rotator cuff impingement syndrome of left shoulder 05/11/2014  . Seasonal allergic rhinitis 03/21/2012  . Sinusitis, chronic 04/20/2016  . Sleep disorder    [name] May  . Subluxation of peroneal tendon of right foot 11/10/2017  . TIA (transient ischemic attack)   . TRANSIENT ISCHEMIC ATTACKS, HX OF 02/02/2008   Qualifier: Diagnosis of  By: [name]    . UNS ADVRS EFF  UNS RX MEDICINAL&BIOLOGICAL SBSTNC 02/02/2008   Qualifier: Diagnosis of  By: [name] MD, [name]    . UNSPECIFIED MYALGIA AND MYOSITIS 02/02/2008   Qualifier: Diagnosis of  By: [name] MD, [name]    . UTI (urinary tract infection) 10/05/2014  . Viral upper respiratory tract infection with cough 12/31/2014  . Vitamin D deficiency 05/25/2008   Qualifier: Diagnosis of  By: [name] MD, [name]      Past Surgical History:  Procedure Laterality Date  . COLONOSCOPY     Dr Carlean [name]; hemorrhoids  . CORONARY ANGIOPLASTY  1997   for chest pain- negative   . POLYPECTOMY  2002   benign, hyperplastic polyp; rectal bleeding 2008  . TONSILLECTOMY    .  TOTAL ABDOMINAL HYSTERECTOMY  1983   BSO for endometriosis  . WISDOM TOOTH EXTRACTION      Current Medications: Current Meds  Medication Sig  . aspirin 81 MG tablet Take 81 mg by mouth daily.    [name] atorvastatin (LIPITOR) 40 MG tablet TAKE 1 TABLET BY MOUTH AT 6 PM  . busPIRone (BUSPAR) 15 MG tablet Take 15 mg by mouth 2 (two) times daily.  . DULoxetine (CYMBALTA) 60 MG capsule Take 60 mg by mouth daily.  . Insulin Glargine (BASAGLAR KWIKPEN) 100 UNIT/ML SOPN Inject 0.3 mLs (30 Units total) into the skin every morning.  . Insulin Pen Needle (BD PEN NEEDLE NANO U/F) 32G X 4 MM MISC USE ONCE A DAY AS DIRECTED  . Lancets (ONETOUCH ULTRASOFT) lancets Check blood sugar once daily. Dx code: 250.00  . LORazepam (ATIVAN) 0.5 MG tablet Take 0.5 mg by mouth 2 (two) times daily as needed. For anxiety.  [name] losartan (COZAAR) 100 MG tablet Take 1 tablet (100 mg total) by mouth daily.  . naproxen (NAPROSYN) 500 MG tablet TAKE 1 TABLET (500 MG TOTAL) BY MOUTH 2 (TWO) TIMES DAILY WITH A MEAL.  [name] omeprazole (PRILOSEC) 20 MG capsule TAKE 1 CAPSULE BY MOUTH EVERY DAY  . ONE TOUCH ULTRA TEST test strip USE 1 STRIP TWICE DAILY DX CODE E11.65  . pioglitazone (ACTOS) 15 MG tablet Take 1 tablet (15 mg total) by mouth daily.  . promethazine (PHENERGAN) 12.5 MG tablet Take 1 tablet (12.5 mg total) by mouth every 8 (eight) hours as needed for nausea or vomiting.  [name] tiZANidine (ZANAFLEX) 2 MG tablet TAKE 1 TABLET BY MOUTH EVERY 8 HOURS AS NEEDED FOR MUSCLE SPASMS  . traMADol (ULTRAM) 50 MG tablet TAKE 1 TABLET BY MOUTH EVERY 6 HOURS AS NEEDED  . traZODone (DESYREL) 50 MG tablet TAKE 0.5-1 TABLETS (25-50 MG TOTAL) BY MOUTH AT BEDTIME AS NEEDED FOR SLEEP.  . Vitamin D, Ergocalciferol, (DRISDOL) 1.25 MG (50000 UT) CAPS capsule TAKE 1 CAPSULE BY MOUTH ONE TIME PER WEEK     Allergies:   Vioxx [rofecoxib], Prednisone, and Colesevelam   Social History   Socioeconomic History  . Marital status: Married    Spouse name: Not  on file  . Number of children: Not on file  . Years of education: Not on file  . Highest education level: Not on file  Occupational History  . Occupation: Designer, television/film set: [employer]  Social Needs  . Financial resource strain: Not on file  . Food insecurity    Worry: Not on file    Inability: Not on file  . Transportation needs    Medical: Not on file    Non-medical: Not on file  Tobacco Use  . Smoking  status: Never Smoker  . Smokeless tobacco: Never Used  Substance and Sexual Activity  . Alcohol use: No  . Drug use: No  . Sexual activity: Not on file  Lifestyle  . Physical activity    Days per week: Not on file    Minutes per session: Not on file  . Stress: Not on file  Relationships  . Social Herbalist on phone: Not on file    Gets together: Not on file    Attends religious service: Not on file    Active member of club or organization: Not on file    Attends meetings of clubs or organizations: Not on file    Relationship status: Not on file  Other Topics Concern  . Not on file  Social History Narrative   Regular exercise- no      Family History: The patient's family history includes COPD in her mother; Colon cancer in her maternal aunt; Diabetes in her paternal uncle; Heart attack in an other family member; Heart attack (age of onset: 31) in her father; Lung cancer in her brother; Mental illness in an other family member. There is no history of Stroke.  ROS:   Please see the history of present illness.    She has had anxiety.  Very sedentary lifestyle.  Not getting 150 minutes of moderate activity.  Her husband has cardiac issues.  She has been told that she has a heart murmur.  All other systems reviewed and are negative.  EKGs/Labs/Other Studies Reviewed:    The following studies were reviewed today: No recent cardiac data.  EKG:  EKG performed September 18 when palpitations started is normal.  No ectopy or rhythm  disturbances noted.  This was done in the emergency room.  Recent Labs: 02/08/2019: ALT 18 09/01/2019: BUN 17; Creatinine, Ser 0.62; Hemoglobin 12.2; Magnesium 1.9; Platelets 218; Potassium 3.5; Sodium 137  Recent Lipid Panel    Component Value Date/Time   CHOL 163 02/08/2019 1121   TRIG 81.0 02/08/2019 1121   HDL 54.70 02/08/2019 1121   CHOLHDL 3 02/08/2019 1121   VLDL 16.2 02/08/2019 1121   LDLCALC 92 02/08/2019 1121    Physical Exam:    VS:  BP 124/74   Pulse 86   Ht 5\' 6"  (1.676 m)   Wt 185 lb (83.9 kg)   LMP  (LMP Unknown)   SpO2 96%   BMI 29.86 kg/m     Wt Readings from Last 3 Encounters:  09/19/19 185 lb (83.9 kg)  09/06/19 185 lb (83.9 kg)  09/01/19 187 lb 4.8 oz (85 kg)     GEN: Obese. No acute distress HEENT: Normal NECK: No JVD. LYMPHATICS: No lymphadenopathy CARDIAC: There is a soft right upper sternal border 1/6 to 2/6 systolic murmur.  RRR without diastolic murmur, gallop, or edema. VASCULAR:  Normal Pulses. No bruits. RESPIRATORY:  Clear to auscultation without rales, wheezing or rhonchi  ABDOMEN: Soft, non-tender, non-distended, No pulsatile mass, MUSCULOSKELETAL: No deformity  SKIN: Warm and dry NEUROLOGIC:  Alert and oriented x 3 PSYCHIATRIC:  Normal affect   ASSESSMENT:    1. Palpitations   2. Hyperlipidemia associated with type 2 diabetes mellitus ([place])   3. Type 2 diabetes mellitus with diabetic peripheral angiopathy without gangrene, with long-term current use of insulin ([place])   4. Murmur   5. Educated about COVID-19 virus infection    PLAN:    In order of problems listed above:  1. A 30-day preventatives event monitor  will be performed.  I need to exclude the presence of atrial fibrillation.  I need to identify and quantitate PVCs of present. 2. LDL is slightly elevated for someone with a strong family history of vascular disease.  Father and 2 brothers have died of myocardial infarction before the age of 66.  I would recommend LDL less  than 70 especially given her diabetes.  Atorvastatin should probably be increased to 80 mg/day. 3. Hemoglobin A1c target in cardiovascular medicine should be less than 7.  Consider PCSK9 therapy. 4. 2D Doppler echocardiogram to rule out aortic stenosis. 5. The 3W's are reviewed and the patient endorses compliance.  Overall education and awareness concerning primary risk prevention was discussed in detail: LDL less than 70, hemoglobin A1c less than 7, blood pressure target less than 130/80 mmHg, >150 minutes of moderate aerobic activity per week, avoidance of smoking, weight control (via diet and exercise), and continued surveillance/management of/for obstructive sleep apnea.    Medication Adjustments/Labs and Tests Ordered: Current medicines are reviewed at length with the patient today.  Concerns regarding medicines are outlined above.  Orders Placed This Encounter  Procedures  . Cardiac event monitor  . ECHOCARDIOGRAM COMPLETE   No orders of the defined types were placed in this encounter.   Patient Instructions  Medication Instructions:  Your physician recommends that you continue on your current medications as directed. Please refer to the Current Medication list given to you today.  If you need a refill on your cardiac medications before your next appointment, please call your pharmacy.   Lab work: None If you have labs (blood work) drawn today and your tests are completely normal, you will receive your results only by: [name] MyChart Message (if you have MyChart) OR . A paper copy in the mail If you have any lab test that is abnormal or we need to change your treatment, we will call you to review the results.  Testing/Procedures: Your physician has requested that you have an echocardiogram. Echocardiography is a painless test that uses sound waves to create images of your heart. It provides your doctor with information about the size and shape of your heart and how well your heart's  chambers and valves are working. This procedure takes approximately one hour. There are no restrictions for this procedure.  Your physician has recommended that you wear an event monitor. Event monitors are medical devices that record the heart's electrical activity. Doctors most often [place] these monitors to diagnose arrhythmias. Arrhythmias are problems with the speed or rhythm of the heartbeat. The monitor is a small, portable device. You can wear one while you do your normal daily activities. This is usually used to diagnose what is causing palpitations/syncope (passing out).   Follow-Up: At [facility], you and your health needs are our priority.  As part of our continuing mission to provide you with exceptional heart care, we have created designated Provider Care Teams.  These Care Teams include your primary Cardiologist (physician) and Advanced Practice Providers (APPs -  Physician Assistants and Nurse Practitioners) who all work together to provide you with the care you need, when you need it. You will need a follow up appointment in 6-8 weeks.  Please call our office 2 months in advance to schedule this appointment.  You may see Dr. Tamala Julian or one of the following Advanced Practice Providers on your designated Care Team:   Truitt Merle, NP Cecilie Kicks, NP . Kathyrn Drown, NP  Any Other Special Instructions Will Be  Listed Below (If Applicable).       Signed, Sinclair Grooms, MD  09/19/2019 11:45 AM    [place]

## 2019-09-19 ENCOUNTER — Encounter: Payer: Self-pay | Admitting: Interventional Cardiology

## 2019-09-19 ENCOUNTER — Ambulatory Visit (INDEPENDENT_AMBULATORY_CARE_PROVIDER_SITE_OTHER): Payer: Medicare Other | Admitting: Interventional Cardiology

## 2019-09-19 ENCOUNTER — Other Ambulatory Visit: Payer: Self-pay

## 2019-09-19 VITALS — BP 124/74 | HR 86 | Ht 66.0 in | Wt 185.0 lb

## 2019-09-19 DIAGNOSIS — E785 Hyperlipidemia, unspecified: Secondary | ICD-10-CM

## 2019-09-19 DIAGNOSIS — Z7189 Other specified counseling: Secondary | ICD-10-CM

## 2019-09-19 DIAGNOSIS — E1151 Type 2 diabetes mellitus with diabetic peripheral angiopathy without gangrene: Secondary | ICD-10-CM

## 2019-09-19 DIAGNOSIS — E1169 Type 2 diabetes mellitus with other specified complication: Secondary | ICD-10-CM

## 2019-09-19 DIAGNOSIS — R011 Cardiac murmur, unspecified: Secondary | ICD-10-CM | POA: Diagnosis not present

## 2019-09-19 DIAGNOSIS — R002 Palpitations: Secondary | ICD-10-CM

## 2019-09-19 DIAGNOSIS — Z794 Long term (current) use of insulin: Secondary | ICD-10-CM

## 2019-09-19 NOTE — Patient Instructions (Signed)
Medication Instructions:  Your physician recommends that you continue on your current medications as directed. Please refer to the Current Medication list given to you today.  If you need a refill on your cardiac medications before your next appointment, please call your pharmacy.   Lab work: None If you have labs (blood work) drawn today and your tests are completely normal, you will receive your results only by: [name] MyChart Message (if you have MyChart) OR . A paper copy in the mail If you have any lab test that is abnormal or we need to change your treatment, we will call you to review the results.  Testing/Procedures: Your physician has requested that you have an echocardiogram. Echocardiography is a painless test that uses sound waves to create images of your heart. It provides your doctor with information about the size and shape of your heart and how well your heart's chambers and valves are working. This procedure takes approximately one hour. There are no restrictions for this procedure.  Your physician has recommended that you wear an event monitor. Event monitors are medical devices that record the heart's electrical activity. Doctors most often [place] these monitors to diagnose arrhythmias. Arrhythmias are problems with the speed or rhythm of the heartbeat. The monitor is a small, portable device. You can wear one while you do your normal daily activities. This is usually used to diagnose what is causing palpitations/syncope (passing out).   Follow-Up: At [facility], you and your health needs are our priority.  As part of our continuing mission to provide you with exceptional heart care, we have created designated Provider Care Teams.  These Care Teams include your primary Cardiologist (physician) and Advanced Practice Providers (APPs -  Physician Assistants and Nurse Practitioners) who all work together to provide you with the care you need, when you need it. You will need a follow up  appointment in 6-8 weeks.  Please call our office 2 months in advance to schedule this appointment.  You may see Dr. Tamala Julian or one of the following Advanced Practice Providers on your designated Care Team:   Truitt Merle, NP Cecilie Kicks, NP . Kathyrn Drown, NP  Any Other Special Instructions Will Be Listed Below (If Applicable).

## 2019-09-20 ENCOUNTER — Telehealth: Payer: Self-pay

## 2019-09-20 NOTE — Telephone Encounter (Signed)
Went over monitor instructions with pt while she was here at her visit with Dr Tamala Julian. 30 day [employer] Event Monitor was ordered to be shipped to pt's home address.

## 2019-09-22 ENCOUNTER — Ambulatory Visit (HOSPITAL_COMMUNITY): Payer: Medicare Other | Attending: Cardiology

## 2019-09-22 ENCOUNTER — Other Ambulatory Visit: Payer: Self-pay

## 2019-09-22 DIAGNOSIS — R011 Cardiac murmur, unspecified: Secondary | ICD-10-CM | POA: Insufficient documentation

## 2019-09-25 ENCOUNTER — Other Ambulatory Visit: Payer: Self-pay | Admitting: Internal Medicine

## 2019-09-25 MED ORDER — TRAZODONE HCL 50 MG PO TABS: 25.0000 mg | ORAL_TABLET | Freq: Every evening | ORAL | 0 refills | Status: DC | PRN

## 2019-09-25 NOTE — Telephone Encounter (Signed)
Patient would like a refill on her traZODone (DESYREL) 50 MG tablet medication and have it sent to her preferred pharmacy [facility] on [address].

## 2019-09-25 NOTE — Telephone Encounter (Signed)
Requested medication (s) are due for refill today: yes  Requested medication (s) are on the active medication list: yes  Last refill:  06/28/2018  Future visit scheduled: yes  Notes to clinic:  Last refilled by different provider    Requested Prescriptions  Pending Prescriptions Disp Refills   traZODone (DESYREL) 50 MG tablet 90 tablet 0    Sig: Take 0.5-1 tablets (25-50 mg total) by mouth at bedtime as needed for sleep.     Psychiatry: Antidepressants - Serotonin Modulator Passed - 09/25/2019 12:15 PM      Passed - Valid encounter within last 6 months    Recent Outpatient Visits          2 weeks ago [facility] Herndon, Olevia Bowens, DO   1 month ago Nonallopathic lesion of lumbosacral region   Crewe, [place], DO   3 months ago Nonallopathic lesion of lumbosacral region   Violet, Sherrill, DO   3 months ago Nonallopathic lesion of lumbosacral region   Teton, Sharon, DO   5 months ago Nonallopathic lesion of lumbosacral region   Bruno, [place], DO      Future Appointments            In 3 weeks Lyndal Pulley, [place], [place]   In 1 month Belva Crome, MD [place], [facility]           Passed - Completed PHQ-2 or PHQ-9 in the last 360 days.

## 2019-09-27 ENCOUNTER — Ambulatory Visit (INDEPENDENT_AMBULATORY_CARE_PROVIDER_SITE_OTHER): Payer: Medicare Other

## 2019-09-27 DIAGNOSIS — R002 Palpitations: Secondary | ICD-10-CM | POA: Diagnosis not present

## 2019-10-04 ENCOUNTER — Other Ambulatory Visit: Payer: Self-pay | Admitting: Endocrinology

## 2019-10-05 ENCOUNTER — Other Ambulatory Visit: Payer: Self-pay | Admitting: Family Medicine

## 2019-10-18 ENCOUNTER — Encounter: Payer: Self-pay | Admitting: Family Medicine

## 2019-10-18 ENCOUNTER — Ambulatory Visit (INDEPENDENT_AMBULATORY_CARE_PROVIDER_SITE_OTHER): Payer: Medicare Other | Admitting: Family Medicine

## 2019-10-18 ENCOUNTER — Other Ambulatory Visit: Payer: Self-pay

## 2019-10-18 VITALS — BP 118/88 | HR 94 | Ht 66.0 in | Wt 185.0 lb

## 2019-10-18 DIAGNOSIS — M999 Biomechanical lesion, unspecified: Secondary | ICD-10-CM | POA: Diagnosis not present

## 2019-10-18 DIAGNOSIS — M5416 Radiculopathy, lumbar region: Secondary | ICD-10-CM | POA: Diagnosis not present

## 2019-10-18 NOTE — Assessment & Plan Note (Signed)
Discussed with patient about posture and ergonomics, discussed which activities to do which wants to avoid.  No radicular symptoms at this point.  Discussed core strengthening.  Follow-up again in 4 to 8 weeks.

## 2019-10-18 NOTE — Progress Notes (Signed)
[name] [address] [place], [place] [address] Phone: [phone] Subjective:   Christine [name], am serving as a scribe for Dr. Hulan Saas.   CC: Back pain  [id number]  Christine [name] is a 68 y.o. female coming in with complaint of back pain. Last seen on 09/06/2019 for OMT. Patient states he has been doing relatively well.  Patient has had some mild pain in the neck and upper back more than the lower back.  [name] radiation this time.  Being worked up for some dizziness and is wearing a heart monitor at the moment.  [name] chest pain     Past Medical History:  Diagnosis Date  . Acute bursitis of right shoulder 08/29/2018   Right shoulder injected in August 29, 2018  . Allergic rhinitis   . Closed displaced fracture of fifth metatarsal bone of left foot 03/16/2017  . DEPRESSIVE DISORDER NOT ELSEWHERE CLASSIFIED 03/27/2010   Qualifier: Diagnosis of  By: [name] MD, [name]   [name], NP , [facility] [place],     . Diabetes ([place]) 04/02/2016  . Diabetes mellitus   . Essential hypertension 03/14/2008   Qualifier: Diagnosis of  By: [name] MD, [name]    . Fibromyalgia 08/12/2011   Diagnosed by [name] , Rheumatologist 2010   . GERD (gastroesophageal reflux disease) 12/25/2016  . Gluteal tendonitis of right buttock 10/19/2017   Injected in October 19, 2017 Injected October 07, 2018  . Greater trochanteric bursitis of left hip 06/17/2016  . Greater trochanteric bursitis of right hip 11/27/2015   Injected 11/27/2015   . History of recurrent UTIs    [name]  . HTN (hypertension)   . Hx of osteopenia 10/11/2012   [facility] DEXA; ordered by [name] Lowest T score: -2.1 @ lumbar spine @ [facility] 11/01/12; decrease of 8.5% vs 05/2009. There is 9.2% risk over 10 yrs History fracture left elbow @ 4 Fractured left wrist , right elbow and nose post fall July/18/2013. Dr. [name] FH Osteoporosis [name] PMH of bisphosphonate therapy (generic  Fosamax Rxed but not filled  due to polypharmacy )   . Hyperlipidemia   . Hyperlipidemia associated with type 2 diabetes mellitus ([place]) 01/18/2007   Qualifier: Diagnosis of  By: [name]  With DM LDL goal = < 100, ideally < 70. Mi in father @ 14; 2 brothers @ 73 & 20    . Lumbar radiculopathy 04/11/2008   Qualifier: Diagnosis of  By: [name] MD, Erick Blinks well to epidural 01/12/2017  repeat epidural given May 2018  . Migraine headache    quiescent  . Nonallopathic lesion of cervical region 07/19/2014  . Nonallopathic lesion of lumbosacral region 01/15/2016  . Nonallopathic lesion of sacral region 06/27/2018  . Nonallopathic lesion-rib cage 09/26/2014  . Palpitations 04/15/2011  . Rotator cuff impingement syndrome of left shoulder 05/11/2014  . Seasonal allergic rhinitis 03/21/2012  . Sinusitis, chronic 04/20/2016  . Sleep disorder    [name] May  . Subluxation of peroneal tendon of right foot 11/10/2017  . TIA (transient ischemic attack)   . TRANSIENT ISCHEMIC ATTACKS, HX OF 02/02/2008   Qualifier: Diagnosis of  By: [name]    . UNS ADVRS EFF UNS RX MEDICINAL&BIOLOGICAL SBSTNC 02/02/2008   Qualifier: Diagnosis of  By: [name] MD, [name]    . UNSPECIFIED MYALGIA AND MYOSITIS 02/02/2008   Qualifier: Diagnosis of  By: [name] MD, [name]    . UTI (urinary  tract infection) 10/05/2014  . Viral upper respiratory tract infection with cough 12/31/2014  . Vitamin D deficiency 05/25/2008   Qualifier: Diagnosis of  By: [name] MD, [name]     Past Surgical History:  Procedure Laterality Date  . COLONOSCOPY     Dr Carlean [name]; hemorrhoids  . CORONARY ANGIOPLASTY  1997   for chest pain- negative   . POLYPECTOMY  2002   benign, hyperplastic polyp; rectal bleeding 2008  . TONSILLECTOMY    . TOTAL ABDOMINAL HYSTERECTOMY  1983   BSO for endometriosis  . WISDOM TOOTH EXTRACTION     Social History   Socioeconomic History  . Marital status: Married    Spouse name: Not on file  . Number of  children: Not on file  . Years of education: Not on file  . Highest education level: Not on file  Occupational History  . Occupation: Designer, television/film set: [employer]  Social Needs  . Financial resource strain: Not on file  . Food insecurity    Worry: Not on file    Inability: Not on file  . Transportation needs    Medical: Not on file    Non-medical: Not on file  Tobacco Use  . Smoking status: Never Smoker  . Smokeless tobacco: Never Used  Substance and Sexual Activity  . Alcohol use: [name]  . Drug use: [name]  . Sexual activity: Not on file  Lifestyle  . Physical activity    Days per week: Not on file    Minutes per session: Not on file  . Stress: Not on file  Relationships  . Social Herbalist on phone: Not on file    Gets together: Not on file    Attends religious service: Not on file    Active member of club or organization: Not on file    Attends meetings of clubs or organizations: Not on file    Relationship status: Not on file  Other Topics Concern  . Not on file  Social History Narrative   Regular exercise- [name]    Allergies  Allergen Reactions  . Vioxx [Rofecoxib] Other (See Comments)    Excess BP which caused TIA   . Prednisone Other (See Comments)    Mental status changes [name] associated rash or fever  . Colesevelam Other (See Comments)    Leg Pain   Family History  Problem Relation Age of Onset  . Colon cancer Maternal Aunt   . Diabetes Paternal Uncle   . Heart attack Father 71  . COPD Mother   . Lung cancer Brother        smoker  . Mental illness Other        niece, committed suicide.  [name] Heart attack Other        brother X 2; @ 49 & 74  . Stroke Neg Hx     Current Outpatient Medications (Endocrine & Metabolic):  [name]  Insulin Glargine (BASAGLAR KWIKPEN) 100 UNIT/ML SOPN, Inject 0.3 mLs (30 Units total) into the skin every morning. .  pioglitazone (ACTOS) 15 MG tablet, Take 1 tablet (15 mg total) by mouth daily.   Current Outpatient Medications (Cardiovascular):  .  atorvastatin (LIPITOR) 40 MG tablet, TAKE 1 TABLET BY MOUTH AT 6 PM .  losartan (COZAAR) 100 MG tablet, Take 1 tablet (100 mg total) by mouth daily.  Current Outpatient Medications (Respiratory):  .  promethazine (PHENERGAN) 12.5 MG tablet, Take 1 tablet (12.5 mg total) by  mouth every 8 (eight) hours as needed for nausea or vomiting.  Current Outpatient Medications (Analgesics):  .  aspirin 81 MG tablet, Take 81 mg by mouth daily.   .  naproxen (NAPROSYN) 500 MG tablet, TAKE 1 TABLET (500 MG TOTAL) BY MOUTH 2 (TWO) TIMES DAILY WITH A MEAL. [name]  traMADol (ULTRAM) 50 MG tablet, TAKE 1 TABLET BY MOUTH EVERY 6 HOURS AS NEEDED   Current Outpatient Medications (Other):  .  busPIRone (BUSPAR) 15 MG tablet, Take 15 mg by mouth 2 (two) times daily. .  DULoxetine (CYMBALTA) 60 MG capsule, Take 60 mg by mouth daily. .  Insulin Pen Needle (BD PEN NEEDLE NANO U/F) 32G X 4 MM MISC, USE ONCE A DAY AS DIRECTED .  Lancets (ONETOUCH ULTRASOFT) lancets, Check blood sugar once daily. Dx code: 250.00 .  LORazepam (ATIVAN) 0.5 MG tablet, Take 0.5 mg by mouth 2 (two) times daily as needed. For anxiety. [name]  omeprazole (PRILOSEC) 20 MG capsule, TAKE 1 CAPSULE BY MOUTH EVERY DAY .  ONETOUCH ULTRA test strip, USE 1 STRIP TWICE DAILY DX CODE E11.65 .  tiZANidine (ZANAFLEX) 2 MG tablet, TAKE 1 TABLET BY MOUTH EVERY 8 HOURS AS NEEDED FOR MUSCLE SPASMS .  traZODone (DESYREL) 50 MG tablet, Take 0.5-1 tablets (25-50 mg total) by mouth at bedtime as needed for sleep. .  Vitamin D, Ergocalciferol, (DRISDOL) 1.25 MG (50000 UT) CAPS capsule, TAKE 1 CAPSULE BY MOUTH ONE TIME PER WEEK    Past medical history, social, surgical and family history all reviewed in electronic medical record.  [name] pertanent information unless stated regarding to the chief complaint.   Review of Systems:  [name] headache, visual changes, nausea, vomiting, diarrhea, constipation, dizziness, abdominal  pain, skin rash, fevers, chills, night sweats, weight loss, swollen lymph nodes, body aches, joint swelling, chest pain, shortness of breath, mood changes.  Positive muscle aches  Objective  Blood pressure 118/88, pulse 94, height 5\' 6"  (1.676 m), weight 185 lb (83.9 kg), SpO2 96 %.    General: [name] apparent distress alert and oriented x3 mood and affect normal, dressed appropriately.  HEENT: Pupils equal, extraocular movements intact  Respiratory: Patient's speak in full sentences and does not appear short of breath  Cardiovascular: [name] lower extremity edema, non tender, [name] erythema  Skin: Warm dry intact with [name] signs of infection or rash on extremities or on axial skeleton.  Abdomen: Soft nontender  Neuro: Cranial nerves II through XII are intact, neurovascularly intact in all extremities with 2+ DTRs and 2+ pulses.  Lymph: [name] lymphadenopathy of posterior or anterior cervical chain or axillae bilaterally.  Gait normal with good balance and coordination.  MSK: tender with full range of motion and good stability and symmetric strength and tone of shoulders, elbows, wrist, hip, knee and ankles bilaterally.  Neck exam shows some tightness noted.  Some decrease in sidebending bilaterally right greater than left.  Negative Spurling's. Low back pain does have some tightness in the paraspinal musculature.  Negative straight leg test.  Neurovascularly intact distally.  Osteopathic findings  C6 flexed rotated and side bent left T3 extended rotated and side bent right inhaled third rib T5 extended rotated and side bent left L2 flexed rotated and side bent right Sacrum right on right     Impression and Recommendations:     This case required medical decision making of moderate complexity. The above documentation has been reviewed and is accurate and complete Lyndal Pulley, DO  Note: This dictation was prepared with Dragon dictation along with smaller phrase technology. Any  transcriptional errors that result from this process are unintentional.

## 2019-10-18 NOTE — Assessment & Plan Note (Signed)
Decision today to treat with OMT was based on Physical Exam  After verbal consent patient was treated with HVLA, ME, FPR techniques in cervical, thoracic, rib,  lumbar and sacral areas  Patient tolerated the procedure well with improvement in symptoms  Patient given exercises, stretches and lifestyle modifications  See medications in patient instructions if given  Patient will follow up in 4-8 weeks 

## 2019-10-18 NOTE — Patient Instructions (Signed)
Keep doing what you are doing See me again in 6 weeks

## 2019-11-07 ENCOUNTER — Other Ambulatory Visit: Payer: Self-pay | Admitting: Endocrinology

## 2019-11-07 DIAGNOSIS — E1151 Type 2 diabetes mellitus with diabetic peripheral angiopathy without gangrene: Secondary | ICD-10-CM

## 2019-11-07 DIAGNOSIS — Z794 Long term (current) use of insulin: Secondary | ICD-10-CM

## 2019-11-07 NOTE — Telephone Encounter (Signed)
Please advise 

## 2019-11-12 NOTE — Progress Notes (Signed)
Cardiology Office Note:    Date:  11/15/2019   ID:  Christine, [name] 1951/12/10, MRN [id number]  PCP:  [name], MD  Cardiologist:  Sinclair Grooms, MD   Referring MD: [name], *   Chief Complaint  Patient presents with  . Irregular Heart Beat    AI VR on monitor    History of Present Illness:    Christine [name] is a 68 y.o. female with a hx of  Palpitations.  She has had continuous monitoring which revealed accelerated idioventricular rhythm.  There also rare PACs and PVCs.  There is no correlation between complaints and rhythm disturbance.  The accelerated idioventricular rhythm raises the question of ischemic heart disease.  She has a strong family history of CAD.  She does not have clinical symptoms suggestive of angina but does experience some dyspnea.  Again her major complaint is palpitations.  Complaint has improved over the last month or so.  Past Medical History:  Diagnosis Date  . Acute bursitis of right shoulder 08/29/2018   Right shoulder injected in August 29, 2018  . Allergic rhinitis   . Closed displaced fracture of fifth metatarsal bone of left foot 03/16/2017  . DEPRESSIVE DISORDER NOT ELSEWHERE CLASSIFIED 03/27/2010   Qualifier: Diagnosis of  By: [name] MD, [name]   Mr Christine Manila, NP , [facility] [place]    . Diabetes ([place]) 04/02/2016  . Diabetes mellitus   . Essential hypertension 03/14/2008   Qualifier: Diagnosis of  By: [name] MD, [name]    . Fibromyalgia 08/12/2011   Diagnosed by [name] , Rheumatologist 2010   . GERD (gastroesophageal reflux disease) 12/25/2016  . Gluteal tendonitis of right buttock 10/19/2017   Injected in October 19, 2017 Injected October 07, 2018  . Greater trochanteric bursitis of left hip 06/17/2016  . Greater trochanteric bursitis of right hip 11/27/2015   Injected 11/27/2015   . History of recurrent UTIs    [name]  . HTN (hypertension)   . Hx of osteopenia  10/11/2012   [facility] DEXA; ordered by [name] Lowest T score: -2.1 @ lumbar spine @ [facility] 11/01/12; decrease of 8.5% vs 05/2009. There is 9.2% risk over 10 yrs History fracture left elbow @ 4 Fractured left wrist , right elbow and nose post fall July/18/2013. Dr. [name] No FH Osteoporosis No PMH of bisphosphonate therapy (generic Fosamax Rxed but not filled  due to polypharmacy )   . Hyperlipidemia   . Hyperlipidemia associated with type 2 diabetes mellitus ([place]) 01/18/2007   Qualifier: Diagnosis of  By: [name]  With DM LDL goal = < 100, ideally < 70. Mi in father @ 15; 2 brothers @ 40 & 9    . Lumbar radiculopathy 04/11/2008   Qualifier: Diagnosis of  By: [name] MD, Erick Blinks well to epidural 01/12/2017  repeat epidural given May 2018  . Migraine headache    quiescent  . Nonallopathic lesion of cervical region 07/19/2014  . Nonallopathic lesion of lumbosacral region 01/15/2016  . Nonallopathic lesion of sacral region 06/27/2018  . Nonallopathic lesion-rib cage 09/26/2014  . Palpitations 04/15/2011  . Rotator cuff impingement syndrome of left shoulder 05/11/2014  . Seasonal allergic rhinitis 03/21/2012  . Sinusitis, chronic 04/20/2016  . Sleep disorder    [name] May  . Subluxation of peroneal tendon of right foot 11/10/2017  . TIA (transient ischemic attack)   . TRANSIENT ISCHEMIC ATTACKS, HX OF 02/02/2008   Qualifier:  Diagnosis of  By: [name]    . UNS ADVRS EFF UNS RX MEDICINAL&BIOLOGICAL SBSTNC 02/02/2008   Qualifier: Diagnosis of  By: [name] MD, [name]    . UNSPECIFIED MYALGIA AND MYOSITIS 02/02/2008   Qualifier: Diagnosis of  By: [name] MD, [name]    . UTI (urinary tract infection) 10/05/2014  . Viral upper respiratory tract infection with cough 12/31/2014  . Vitamin D deficiency 05/25/2008   Qualifier: Diagnosis of  By: [name] MD, [name]      Past Surgical History:  Procedure Laterality Date  . COLONOSCOPY     Dr Carlean [name]; hemorrhoids  . CORONARY ANGIOPLASTY   1997   for chest pain- negative   . POLYPECTOMY  2002   benign, hyperplastic polyp; rectal bleeding 2008  . TONSILLECTOMY    . TOTAL ABDOMINAL HYSTERECTOMY  1983   BSO for endometriosis  . WISDOM TOOTH EXTRACTION      Current Medications: Current Meds  Medication Sig  . aspirin 81 MG tablet Take 81 mg by mouth daily.    [name] atorvastatin (LIPITOR) 40 MG tablet TAKE 1 TABLET BY MOUTH AT 6 PM  . busPIRone (BUSPAR) 15 MG tablet Take 15 mg by mouth 2 (two) times daily.  . DULoxetine (CYMBALTA) 60 MG capsule Take 60 mg by mouth daily.  . Insulin Glargine (BASAGLAR KWIKPEN) 100 UNIT/ML SOPN INJECT 0.24 MLS (24 UNITS TOTAL) INTO THE SKIN EVERY MORNING.  . Insulin Pen Needle (BD PEN NEEDLE NANO U/F) 32G X 4 MM MISC USE ONCE A DAY AS DIRECTED  . Lancets (ONETOUCH ULTRASOFT) lancets Check blood sugar once daily. Dx code: 250.00  . LORazepam (ATIVAN) 0.5 MG tablet Take 0.5 mg by mouth 2 (two) times daily as needed. For anxiety.  [name] losartan (COZAAR) 100 MG tablet Take 1 tablet (100 mg total) by mouth daily.  . naproxen (NAPROSYN) 500 MG tablet TAKE 1 TABLET (500 MG TOTAL) BY MOUTH 2 (TWO) TIMES DAILY WITH A MEAL.  [name] omeprazole (PRILOSEC) 20 MG capsule TAKE 1 CAPSULE BY MOUTH EVERY DAY  . ONETOUCH ULTRA test strip USE 1 STRIP TWICE DAILY DX CODE E11.65  . pioglitazone (ACTOS) 15 MG tablet Take 1 tablet (15 mg total) by mouth daily.  . promethazine (PHENERGAN) 12.5 MG tablet Take 1 tablet (12.5 mg total) by mouth every 8 (eight) hours as needed for nausea or vomiting.  [name] tiZANidine (ZANAFLEX) 2 MG tablet TAKE 1 TABLET BY MOUTH EVERY 8 HOURS AS NEEDED FOR MUSCLE SPASMS  . traMADol (ULTRAM) 50 MG tablet TAKE 1 TABLET BY MOUTH EVERY 6 HOURS AS NEEDED  . traZODone (DESYREL) 50 MG tablet Take 0.5-1 tablets (25-50 mg total) by mouth at bedtime as needed for sleep.  . Vitamin D, Ergocalciferol, (DRISDOL) 1.25 MG (50000 UT) CAPS capsule TAKE 1 CAPSULE BY MOUTH ONE TIME PER WEEK     Allergies:   Vioxx  [rofecoxib], Prednisone, and Colesevelam   Social History   Socioeconomic History  . Marital status: Married    Spouse name: Not on file  . Number of children: Not on file  . Years of education: Not on file  . Highest education level: Not on file  Occupational History  . Occupation: Designer, television/film set: [place]  Social Needs  . Financial resource strain: Not on file  . Food insecurity    Worry: Not on file    Inability: Not on file  . Transportation needs    Medical: Not on file  Non-medical: Not on file  Tobacco Use  . Smoking status: Never Smoker  . Smokeless tobacco: Never Used  Substance and Sexual Activity  . Alcohol use: No  . Drug use: No  . Sexual activity: Not on file  Lifestyle  . Physical activity    Days per week: Not on file    Minutes per session: Not on file  . Stress: Not on file  Relationships  . Social Herbalist on phone: Not on file    Gets together: Not on file    Attends religious service: Not on file    Active member of club or organization: Not on file    Attends meetings of clubs or organizations: Not on file    Relationship status: Not on file  Other Topics Concern  . Not on file  Social History Narrative   Regular exercise- no      Family History: The patient's family history includes COPD in her mother; Colon cancer in her maternal aunt; Diabetes in her paternal uncle; Heart attack in an other family member; Heart attack (age of onset: 75) in her father; Lung cancer in her brother; Mental illness in an other family member. There is no history of Stroke.  ROS:   Please see the history of present illness.    None all other systems reviewed and are negative.  EKGs/Labs/Other Studies Reviewed:    The following studies were reviewed today:  ECHOCARDIOGRAM 09/2019: IMPRESSIONS    1. Left ventricular ejection fraction, by visual estimation, is 60 to 65%. The left ventricle has normal  function. Normal left ventricular size. There is no left ventricular hypertrophy.  2. Global right ventricle has normal systolic function.The right ventricular size is normal. No increase in right ventricular wall thickness.  3. Left atrial size was normal.  4. Right atrial size was normal.  5. The mitral valve is normal in structure. No evidence of mitral valve regurgitation. No evidence of mitral stenosis.  6. The tricuspid valve is normal in structure. Tricuspid valve regurgitation was not visualized by color flow Doppler.  7. The aortic valve has an indeterminant number of cusps Aortic valve regurgitation was not visualized by color flow Doppler. Structurally normal aortic valve, with no evidence of sclerosis or stenosis.  8. There is Sclerosis without any evidence of stenosis of the aortic valve.  9. The pulmonic valve was normal in structure. Pulmonic valve regurgitation is not visualized by color flow Doppler. 10. TR signal is inadequate for assessing pulmonary artery systolic pressure. 11. The inferior vena cava is normal in size with greater than 50% respiratory variability, suggesting right atrial pressure of 3 mmHg. 12. Possible small IAS shunt  CARDIAC MONITOR 10/2019: Study Highlights    Normal sinus rhythm  Accelerated idioventricular rhythm lasting 9 seconds at rate of 130 bpm. No symptoms reported.  Rare premature atrial and ventricular beats.  No atrial for ablation.  No pauses or AV block.   Abnormal related to the finding of accelerated idioventricular rhythm.  Correlate with clinical history.     EKG:  EKG not repeated  Recent Labs: 02/08/2019: ALT 18 09/01/2019: BUN 17; Creatinine, Ser 0.62; Hemoglobin 12.2; Magnesium 1.9; Platelets 218; Potassium 3.5; Sodium 137  Recent Lipid Panel    Component Value Date/Time   CHOL 163 02/08/2019 1121   TRIG 81.0 02/08/2019 1121   HDL 54.70 02/08/2019 1121   CHOLHDL 3 02/08/2019 1121   VLDL 16.2 02/08/2019 1121    LDLCALC 92 02/08/2019  1121    Physical Exam:    VS:  BP 124/76   Pulse 97   Ht 5\' 6"  (1.676 m)   Wt 185 lb 6.4 oz (84.1 kg)   LMP  (LMP Unknown)   SpO2 98%   BMI 29.92 kg/m     Wt Readings from Last 3 Encounters:  11/15/19 185 lb 6.4 oz (84.1 kg)  10/18/19 185 lb (83.9 kg)  09/19/19 185 lb (83.9 kg)     GEN: Moderate obesity. No acute distress HEENT: Normal NECK: No JVD. LYMPHATICS: No lymphadenopathy CARDIAC:  RRR without murmur, gallop, or edema. VASCULAR:  Normal Pulses. No bruits. RESPIRATORY:  Clear to auscultation without rales, wheezing or rhonchi  ABDOMEN: Soft, non-tender, non-distended, No pulsatile mass, MUSCULOSKELETAL: No deformity  SKIN: Warm and dry NEUROLOGIC:  Alert and oriented x 3 PSYCHIATRIC:  Normal affect   ASSESSMENT:    1. Palpitations   2. Accelerated idioventricular rhythm ([place])   3. AIVR (accelerated idioventricular rhythm) ([place])   4. Murmur   5. Hyperlipidemia associated with type 2 diabetes mellitus ([place])   6. Type 2 diabetes mellitus with diabetic peripheral angiopathy without gangrene, with long-term current use of insulin ([place])   7. Educated about COVID-19 virus infection   8. Ventricular tachycardia ([place])   9. PVC (premature ventricular contraction)   10. CAD in native artery    PLAN:    In order of problems listed above:  1. AIVR noted on monitor but no reported symptoms.  I have recommended that she undergo coronary CTA to rule out silent obstructive disease that could be causing an ischemic arrhythmia. 2. R/O ischemic heart disease. 3. No particular recommendations unless we find coronary artery disease 4. A clear risk factor for CAD  Further evaluation may be necessary if CTA is abnormal.   Medication Adjustments/Labs and Tests Ordered: Current medicines are reviewed at length with the patient today.  Concerns regarding medicines are outlined above.  Orders Placed This Encounter  Procedures  . CT CORONARY MORPH W/CTA  COR W/SCORE W/CA W/CM &/OR WO/CM  . CT CORONARY FRACTIONAL FLOW RESERVE DATA PREP  . CT CORONARY FRACTIONAL FLOW RESERVE FLUID ANALYSIS  . Basic metabolic panel   Meds ordered this encounter  Medications  . metoprolol tartrate (LOPRESSOR) 100 MG tablet    Sig: Take one tablet by mouth 2 hours prior to your CT    Dispense:  1 tablet    Refill:  0    Patient Instructions  Medication Instructions:  Your physician recommends that you continue on your current medications as directed. Please refer to the Current Medication list given to you today.  *If you need a refill on your cardiac medications before your next appointment, please call your pharmacy*  Lab Work: BMET prior to CT  If you have labs (blood work) drawn today and your tests are completely normal, you will receive your results only by: [name] MyChart Message (if you have MyChart) OR . A paper copy in the mail If you have any lab test that is abnormal or we need to change your treatment, we will call you to review the results.  Testing/Procedures: Your physician recommends that you have a Coronary CT performed.   Follow-Up: At [facility], you and your health needs are our priority.  As part of our continuing mission to provide you with exceptional heart care, we have created designated Provider Care Teams.  These Care Teams include your primary Cardiologist (physician) and Advanced Practice Providers (APPs -  Physician Assistants and Nurse Practitioners) who all work together to provide you with the care you need, when you need it.  Your next appointment:   As needed   The format for your next appointment:   In Person  Provider:   You may see Sinclair Grooms, MD or one of the following Advanced Practice Providers on your designated Care Team:    Truitt Merle, NP  Cecilie Kicks, NP  Kathyrn Drown, NP   Other Instructions  Your cardiac CT will be scheduled at one of the below locations:   [facility]  [address] [place], [place] [address] [phone]  [place] [address] [place], [place] [address] [phone]  If scheduled at [facility], please arrive at the [facility] main entrance of [facility] 30-45 minutes prior to test start time. Proceed to the [facility] Radiology Department (first floor) to check-in and test prep.  If scheduled at [facility], please arrive 15 mins early for check-in and test prep.  Please follow these instructions carefully (unless otherwise directed):  Hold all erectile dysfunction medications at least 3 days (72 hrs) prior to test.  On the Night Before the Test: . Be sure to Drink plenty of water. . Do not consume any caffeinated/decaffeinated beverages or chocolate 12 hours prior to your test. . Do not take any antihistamines 12 hours prior to your test.  On the Day of the Test: . Drink plenty of water. Do not drink any water within one hour of the test. . Do not eat any food 4 hours prior to the test. . You may take your regular medications prior to the test.  . Take metoprolol (Lopressor) two hours prior to test. . HOLD Furosemide/Hydrochlorothiazide morning of the test. . FEMALES- please wear underwire-free bra if available       After the Test: . Drink plenty of water. . After receiving IV contrast, you may experience a mild flushed feeling. This is normal. . On occasion, you may experience a mild rash up to 24 hours after the test. This is not dangerous. If this occurs, you can take Benadryl 25 mg and increase your fluid intake. . If you experience trouble breathing, this can be serious. If it is severe call 911 IMMEDIATELY. If it is mild, please call our office. . If you take any of these medications: Glipizide/Metformin, Avandament, Glucavance, please do not take 48 hours after completing test unless otherwise instructed.    Once we have confirmed authorization from your insurance company, we will call you to set up a date and time for your test.   For non-scheduling related questions, please contact the cardiac imaging nurse navigator should you have any questions/concerns: Marchia Bond, RN Navigator Cardiac Imaging [facility] [phone] Office       Signed, Sinclair Grooms, MD  11/15/2019 12:34 PM    [place]

## 2019-11-15 ENCOUNTER — Ambulatory Visit (INDEPENDENT_AMBULATORY_CARE_PROVIDER_SITE_OTHER): Payer: Medicare Other | Admitting: Interventional Cardiology

## 2019-11-15 ENCOUNTER — Encounter: Payer: Self-pay | Admitting: Interventional Cardiology

## 2019-11-15 ENCOUNTER — Other Ambulatory Visit: Payer: Self-pay

## 2019-11-15 VITALS — BP 124/76 | HR 97 | Ht 66.0 in | Wt 185.4 lb

## 2019-11-15 DIAGNOSIS — E1151 Type 2 diabetes mellitus with diabetic peripheral angiopathy without gangrene: Secondary | ICD-10-CM

## 2019-11-15 DIAGNOSIS — Z7189 Other specified counseling: Secondary | ICD-10-CM

## 2019-11-15 DIAGNOSIS — E1169 Type 2 diabetes mellitus with other specified complication: Secondary | ICD-10-CM | POA: Diagnosis not present

## 2019-11-15 DIAGNOSIS — I493 Ventricular premature depolarization: Secondary | ICD-10-CM

## 2019-11-15 DIAGNOSIS — E785 Hyperlipidemia, unspecified: Secondary | ICD-10-CM

## 2019-11-15 DIAGNOSIS — Z794 Long term (current) use of insulin: Secondary | ICD-10-CM

## 2019-11-15 DIAGNOSIS — R011 Cardiac murmur, unspecified: Secondary | ICD-10-CM

## 2019-11-15 DIAGNOSIS — I472 Ventricular tachycardia, unspecified: Secondary | ICD-10-CM

## 2019-11-15 DIAGNOSIS — R002 Palpitations: Secondary | ICD-10-CM

## 2019-11-15 DIAGNOSIS — I442 Atrioventricular block, complete: Secondary | ICD-10-CM

## 2019-11-15 DIAGNOSIS — I498 Other specified cardiac arrhythmias: Secondary | ICD-10-CM

## 2019-11-15 DIAGNOSIS — I2510 Atherosclerotic heart disease of native coronary artery without angina pectoris: Secondary | ICD-10-CM

## 2019-11-15 MED ORDER — METOPROLOL TARTRATE 100 MG PO TABS: ORAL_TABLET | ORAL | 0 refills | Status: DC

## 2019-11-15 NOTE — Patient Instructions (Addendum)
Medication Instructions:  Your physician recommends that you continue on your current medications as directed. Please refer to the Current Medication list given to you today.  *If you need a refill on your cardiac medications before your next appointment, please call your pharmacy*  Lab Work: BMET prior to CT  If you have labs (blood work) drawn today and your tests are completely normal, you will receive your results only by: [name] MyChart Message (if you have MyChart) OR . A paper copy in the mail If you have any lab test that is abnormal or we need to change your treatment, we will call you to review the results.  Testing/Procedures: Your physician recommends that you have a Coronary CT performed.   Follow-Up: At [facility], you and your health needs are our priority.  As part of our continuing mission to provide you with exceptional heart care, we have created designated Provider Care Teams.  These Care Teams include your primary Cardiologist (physician) and Advanced Practice Providers (APPs -  Physician Assistants and Nurse Practitioners) who all work together to provide you with the care you need, when you need it.  Your next appointment:   As needed   The format for your next appointment:   In Person  Provider:   You may see Sinclair Grooms, MD or one of the following Advanced Practice Providers on your designated Care Team:    Truitt Merle, NP  Cecilie Kicks, NP  Kathyrn Drown, NP   Other Instructions  Your cardiac CT will be scheduled at one of the below locations:   [facility] [address] [place], [place] [address] [phone]  [place] [address] [place], [place] [address] [phone]  If scheduled at [facility], please arrive at the [facility] main entrance of [facility] 30-45 minutes prior to test start time. Proceed to the [facility] Radiology Department  (first floor) to check-in and test prep.  If scheduled at [facility], please arrive 15 mins early for check-in and test prep.  Please follow these instructions carefully (unless otherwise directed):  Hold all erectile dysfunction medications at least 3 days (72 hrs) prior to test.  On the Night Before the Test: . Be sure to Drink plenty of water. . Do not consume any caffeinated/decaffeinated beverages or chocolate 12 hours prior to your test. . Do not take any antihistamines 12 hours prior to your test.  On the Day of the Test: . Drink plenty of water. Do not drink any water within one hour of the test. . Do not eat any food 4 hours prior to the test. . You may take your regular medications prior to the test.  . Take metoprolol (Lopressor) two hours prior to test. . HOLD Furosemide/Hydrochlorothiazide morning of the test. . FEMALES- please wear underwire-free bra if available       After the Test: . Drink plenty of water. . After receiving IV contrast, you may experience a mild flushed feeling. This is normal. . On occasion, you may experience a mild rash up to 24 hours after the test. This is not dangerous. If this occurs, you can take Benadryl 25 mg and increase your fluid intake. . If you experience trouble breathing, this can be serious. If it is severe call 911 IMMEDIATELY. If it is mild, please call our office. . If you take any of these medications: Glipizide/Metformin, Avandament, Glucavance, please  do not take 48 hours after completing test unless otherwise instructed.   Once we have confirmed authorization from your insurance company, we will call you to set up a date and time for your test.   For non-scheduling related questions, please contact the cardiac imaging nurse navigator should you have any questions/concerns: Marchia Bond, RN Navigator Cardiac Imaging [facility] [phone] Office

## 2019-11-27 ENCOUNTER — Encounter: Payer: Self-pay | Admitting: *Deleted

## 2019-11-29 ENCOUNTER — Other Ambulatory Visit: Payer: Medicare Other | Admitting: *Deleted

## 2019-11-29 ENCOUNTER — Other Ambulatory Visit: Payer: Self-pay

## 2019-11-29 ENCOUNTER — Encounter: Payer: Self-pay | Admitting: Family Medicine

## 2019-11-29 ENCOUNTER — Other Ambulatory Visit: Payer: Self-pay | Admitting: Family Medicine

## 2019-11-29 ENCOUNTER — Ambulatory Visit (INDEPENDENT_AMBULATORY_CARE_PROVIDER_SITE_OTHER): Payer: Medicare Other | Admitting: Family Medicine

## 2019-11-29 ENCOUNTER — Ambulatory Visit: Payer: Self-pay

## 2019-11-29 VITALS — BP 130/64 | HR 80 | Ht 66.0 in | Wt 184.0 lb

## 2019-11-29 DIAGNOSIS — I2510 Atherosclerotic heart disease of native coronary artery without angina pectoris: Secondary | ICD-10-CM | POA: Diagnosis not present

## 2019-11-29 DIAGNOSIS — M7551 Bursitis of right shoulder: Secondary | ICD-10-CM

## 2019-11-29 DIAGNOSIS — M25519 Pain in unspecified shoulder: Secondary | ICD-10-CM

## 2019-11-29 DIAGNOSIS — M999 Biomechanical lesion, unspecified: Secondary | ICD-10-CM

## 2019-11-29 DIAGNOSIS — I472 Ventricular tachycardia, unspecified: Secondary | ICD-10-CM

## 2019-11-29 DIAGNOSIS — R002 Palpitations: Secondary | ICD-10-CM

## 2019-11-29 DIAGNOSIS — M25511 Pain in right shoulder: Secondary | ICD-10-CM

## 2019-11-29 DIAGNOSIS — I442 Atrioventricular block, complete: Secondary | ICD-10-CM

## 2019-11-29 MED ORDER — TRAMADOL HCL 50 MG PO TABS: 50.0000 mg | ORAL_TABLET | Freq: Four times a day (QID) | ORAL | 0 refills | Status: DC | PRN

## 2019-11-29 NOTE — Progress Notes (Signed)
[name] [place], [place] [address] Phone: [phone] Subjective:   I Christine [name] am serving as a Education administrator for Dr. Hulan Saas.  This visit occurred during the SARS-CoV-2 public health emergency.  Safety protocols were in place, including screening questions prior to the visit, additional usage of staff PPE, and extensive cleaning of exam room while observing appropriate contact time as indicated for disinfecting solutions.    CC: Neck pain, shoulder pain follow-up  [id number]  Christine [name] is a 68 y.o. female coming in with complaint of back pain. Patient states she feels good. About a week and a half ago she could barely move.      Past Medical History:  Diagnosis Date  . Acute bursitis of right shoulder 08/29/2018   Right shoulder injected in August 29, 2018  . Allergic rhinitis   . Closed displaced fracture of fifth metatarsal bone of left foot 03/16/2017  . DEPRESSIVE DISORDER NOT ELSEWHERE CLASSIFIED 03/27/2010   Qualifier: Diagnosis of  By: [name] MD, [name]   Mr Christine Manila, NP , [facility] [place] [place]    . Diabetes ([place]) 04/02/2016  . Diabetes mellitus   . Essential hypertension 03/14/2008   Qualifier: Diagnosis of  By: [name] MD, [name]    . Fibromyalgia 08/12/2011   Diagnosed by [name] , Rheumatologist 2010   . GERD (gastroesophageal reflux disease) 12/25/2016  . Gluteal tendonitis of right buttock 10/19/2017   Injected in October 19, 2017 Injected October 07, 2018  . Greater trochanteric bursitis of left hip 06/17/2016  . Greater trochanteric bursitis of right hip 11/27/2015   Injected 11/27/2015   . History of recurrent UTIs    [name]  . HTN (hypertension)   . Hx of osteopenia 10/11/2012   [facility]A; ordered by [name] Lowest T score: -2.1 @ lumbar spine @ [facility] 11/01/12; decrease of 8.5% vs 05/2009. There is 9.2% risk over 10 yrs History fracture left elbow @ 4 Fractured left wrist  , right elbow and nose post fall July/18/2013. Dr. [name] No FH Osteoporosis No PMH of bisphosphonate therapy (generic Fosamax Rxed but not filled  due to polypharmacy )   . Hyperlipidemia   . Hyperlipidemia associated with type 2 diabetes mellitus ([place]) 01/18/2007   Qualifier: Diagnosis of  By: [name]  With DM LDL goal = < 100, ideally < 70. Mi in father @ 56; 2 brothers @ 30 & 79    . Lumbar radiculopathy 04/11/2008   Qualifier: Diagnosis of  By: [name] MD, Erick Blinks well to epidural 01/12/2017  repeat epidural given May 2018  . Migraine headache    quiescent  . Nonallopathic lesion of cervical region 07/19/2014  . Nonallopathic lesion of lumbosacral region 01/15/2016  . Nonallopathic lesion of sacral region 06/27/2018  . Nonallopathic lesion-rib cage 09/26/2014  . Palpitations 04/15/2011  . Rotator cuff impingement syndrome of left shoulder 05/11/2014  . Seasonal allergic rhinitis 03/21/2012  . Sinusitis, chronic 04/20/2016  . Sleep disorder    [name] May  . Subluxation of peroneal tendon of right foot 11/10/2017  . TIA (transient ischemic attack)   . TRANSIENT ISCHEMIC ATTACKS, HX OF 02/02/2008   Qualifier: Diagnosis of  By: [name]    . UNS ADVRS EFF UNS RX MEDICINAL&BIOLOGICAL SBSTNC 02/02/2008   Qualifier: Diagnosis of  By: [name] MD, [name]    . UNSPECIFIED MYALGIA AND MYOSITIS 02/02/2008   Qualifier: Diagnosis of  By: [name]  MD, [name]    . UTI (urinary tract infection) 10/05/2014  . Viral upper respiratory tract infection with cough 12/31/2014  . Vitamin D deficiency 05/25/2008   Qualifier: Diagnosis of  By: [name] MD, [name]     Past Surgical History:  Procedure Laterality Date  . COLONOSCOPY     Dr Carlean [name]; hemorrhoids  . CORONARY ANGIOPLASTY  1997   for chest pain- negative   . POLYPECTOMY  2002   benign, hyperplastic polyp; rectal bleeding 2008  . TONSILLECTOMY    . TOTAL ABDOMINAL HYSTERECTOMY  1983   BSO for endometriosis  . WISDOM TOOTH  EXTRACTION     Social History   Socioeconomic History  . Marital status: Married    Spouse name: Not on file  . Number of children: Not on file  . Years of education: Not on file  . Highest education level: Not on file  Occupational History  . 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 Smoking status: Never Smoker  . Smokeless tobacco: Never Used  Substance and Sexual Activity  . Alcohol use: No  . Drug use: No  . Sexual activity: Not on file  Other Topics Concern  . Not on file  Social History Narrative   Regular exercise- no    Social Determinants of Health   Financial Resource Strain:   . Difficulty of Paying Living Expenses: Not on file  Food Insecurity:   . Worried About Charity fundraiser in the Last Year: Not on file  . Ran Out of Food in the Last Year: Not on file  Transportation Needs:   . Lack of Transportation (Medical): Not on file  . Lack of Transportation (Non-Medical): Not on file  Physical Activity:   . Days of Exercise per Week: Not on file  . Minutes of Exercise per Session: Not on file  Stress:   . Feeling of Stress : Not on file  Social Connections:   . Frequency of Communication with Friends and Family: Not on file  . Frequency of Social Gatherings with Friends and Family: Not on file  . Attends Religious Services: Not on file  . Active Member of Clubs or Organizations: Not on file  . Attends Archivist Meetings: Not on file  . Marital Status: Not on file   Allergies  Allergen Reactions  . Vioxx [Rofecoxib] Other (See Comments)    Excess BP which caused TIA   . Prednisone Other (See Comments)    Mental status changes No associated rash or fever  . Colesevelam Other (See Comments)    Leg Pain   Family History  Problem Relation Age of Onset  . Colon cancer Maternal Aunt   . Diabetes Paternal Uncle   . Heart attack Father 32  . COPD Mother   . Lung cancer Brother        smoker    . Mental illness Other        niece, committed suicide.  [name] Heart attack Other        brother X 2; @ 53 & 60  . Stroke Neg Hx     Current Outpatient Medications (Endocrine & Metabolic):  [name]  Insulin Glargine (BASAGLAR KWIKPEN) 100 UNIT/ML SOPN, INJECT 0.24 MLS (24 UNITS TOTAL) INTO THE SKIN EVERY MORNING. .  pioglitazone (ACTOS) 15 MG tablet, Take 1 tablet (15 mg total) by mouth daily.  Current Outpatient Medications (Cardiovascular):  .  atorvastatin (LIPITOR) 40 MG  tablet, TAKE 1 TABLET BY MOUTH AT 6 PM .  losartan (COZAAR) 100 MG tablet, Take 1 tablet (100 mg total) by mouth daily. .  metoprolol tartrate (LOPRESSOR) 100 MG tablet, Take one tablet by mouth 2 hours prior to your CT  Current Outpatient Medications (Respiratory):  .  promethazine (PHENERGAN) 12.5 MG tablet, Take 1 tablet (12.5 mg total) by mouth every 8 (eight) hours as needed for nausea or vomiting.  Current Outpatient Medications (Analgesics):  .  aspirin 81 MG tablet, Take 81 mg by mouth daily.   .  traMADol (ULTRAM) 50 MG tablet, Take 1 tablet (50 mg total) by mouth every 6 (six) hours as needed. .  naproxen (NAPROSYN) 500 MG tablet, TAKE 1 TABLET (500 MG TOTAL) BY MOUTH 2 (TWO) TIMES DAILY WITH A MEAL.   Current Outpatient Medications (Other):  .  busPIRone (BUSPAR) 15 MG tablet, Take 15 mg by mouth 2 (two) times daily. .  DULoxetine (CYMBALTA) 60 MG capsule, Take 60 mg by mouth daily. .  Insulin Pen Needle (BD PEN NEEDLE NANO U/F) 32G X 4 MM MISC, USE ONCE A DAY AS DIRECTED .  Lancets (ONETOUCH ULTRASOFT) lancets, Check blood sugar once daily. Dx code: 250.00 .  LORazepam (ATIVAN) 0.5 MG tablet, Take 0.5 mg by mouth 2 (two) times daily as needed. For anxiety. [name]  omeprazole (PRILOSEC) 20 MG capsule, TAKE 1 CAPSULE BY MOUTH EVERY DAY .  ONETOUCH ULTRA test strip, USE 1 STRIP TWICE DAILY DX CODE E11.65 .  tiZANidine (ZANAFLEX) 2 MG tablet, TAKE 1 TABLET BY MOUTH EVERY 8 HOURS AS NEEDED FOR MUSCLE SPASMS .   traZODone (DESYREL) 50 MG tablet, Take 0.5-1 tablets (25-50 mg total) by mouth at bedtime as needed for sleep. .  Vitamin D, Ergocalciferol, (DRISDOL) 1.25 MG (50000 UT) CAPS capsule, TAKE 1 CAPSULE BY MOUTH ONE TIME PER WEEK    Past medical history, social, surgical and family history all reviewed in electronic medical record.  No pertanent information unless stated regarding to the chief complaint.   Review of Systems:  No headache, visual changes, nausea, vomiting, diarrhea, constipation, dizziness, abdominal pain, skin rash, fevers, chills, night sweats, weight loss, swollen lymph nodes, body aches, joint swelling, , chest pain, shortness of breath, mood changes.  Positive muscle aches  Objective  Blood pressure 130/64, pulse 80, height 5\' 6"  (1.676 m), weight 184 lb (83.5 kg), SpO2 96 %.     General: No apparent distress alert and oriented x3 mood and affect normal, dressed appropriately.  HEENT: Pupils equal, extraocular movements intact  Respiratory: Patient's speak in full sentences and does not appear short of breath  Cardiovascular: No lower extremity edema, non tender, no erythema  Skin: Warm dry intact with no signs of infection or rash on extremities or on axial skeleton.  Abdomen: Soft nontender  Neuro: Cranial nerves II through XII are intact, neurovascularly intact in all extremities with 2+ DTRs and 2+ pulses.  Lymph: No lymphadenopathy of posterior or anterior cervical chain or axillae bilaterally.  Gait normal with good balance and coordination.  MSK:  tender with full range of motion and good stability and symmetric strength and tone of  elbows, wrist, hip, knee and ankles bilaterally.  Patient's mood does have some loss of lordosis, mild increase in kyphosis of the upper thoracic spine.  More pain in the thoracolumbar juncture right greater than left.  Shoulder: Right Inspection reveals no abnormalities, atrophy or asymmetry. Palpation is normal with no tenderness  over AC joint  or bicipital groove. ROM is full in all planes passively. Rotator cuff strength normal throughout. signs of impingement with positive Neer and Hawkin's tests, but negative empty can sign. Speeds and Yergason's tests normal. No labral pathology noted with negative Obrien's, negative clunk and good stability. Normal scapular function observed. No painful arc and no drop arm sign. No apprehension sign    Procedure: Real-time Ultrasound Guided Injection of right glenohumeral joint Device: GE Logiq E  Ultrasound guided injection is preferred based studies that show increased duration, increased effect, greater accuracy, decreased procedural pain, increased response rate with ultrasound guided versus blind injection.  Verbal informed consent obtained.  Time-out conducted.  Noted no overlying erythema, induration, or other signs of local infection.  Skin prepped in a sterile fashion.  Local anesthesia: Topical Ethyl chloride.  With sterile technique and under real time ultrasound guidance:  Joint visualized.  23g 1  inch needle inserted posterior approach. Pictures taken for needle placement. Patient did have injection of 2 cc of 1% lidocaine, 2 cc of 0.5% Marcaine, and 1.0 cc of Kenalog 40 mg/dL. Completed without difficulty  Pain immediately resolved suggesting accurate placement of the medication.  Advised to call if fevers/chills, erythema, induration, drainage, or persistent bleeding.  Images permanently stored and available for review in the ultrasound unit.  Impression: Technically successful ultrasound guided injection.  Osteopathic findings  C6 flexed rotated and side bent left T3 extended rotated and side bent right inhaled third rib T7 extended rotated and side bent left L2 flexed rotated and side bent right Sacrum right on right     Impression and Recommendations:     This case required medical decision making of moderate complexity. The above  documentation has been reviewed and is accurate and complete Lyndal Pulley, DO       Note: This dictation was prepared with Dragon dictation along with smaller phrase technology. Any transcriptional errors that result from this process are unintentional.

## 2019-11-29 NOTE — Assessment & Plan Note (Signed)
Decision today to treat with OMT was based on Physical Exam  After verbal consent patient was treated with HVLA, ME, FPR techniques in cervical, thoracic, rib lumbar and sacral areas  Patient tolerated the procedure well with improvement in symptoms  Patient given exercises, stretches and lifestyle modifications  See medications in patient instructions if given  Patient will follow up in 4-8 weeks 

## 2019-11-29 NOTE — Assessment & Plan Note (Signed)
Repeat injection given today, discussed icing regimen and home exercise, discussed which activities to do which wants to avoid.  Patient should increase activity slowly over the course the next several weeks.  Patient will follow up with me again in 4 to 8 weeks.

## 2019-11-29 NOTE — Patient Instructions (Signed)
  [address] 1st floor [place], [place] [address] Phone [phone]  See me again in 6 weeks

## 2019-11-30 LAB — BASIC METABOLIC PANEL
BUN/Creatinine Ratio: 23 (ref 12–28)
BUN: 16 mg/dL (ref 8–27)
CO2: 23 mmol/L (ref 20–29)
Calcium: 9.5 mg/dL (ref 8.7–10.3)
Chloride: 101 mmol/L (ref 96–106)
Creatinine, Ser: 0.71 mg/dL (ref 0.57–1.00)
GFR calc Af Amer: 100 mL/min/{1.73_m2} (ref >59)
GFR calc non Af Amer: 87 mL/min/{1.73_m2} (ref >59)
Glucose: 174 mg/dL — ABNORMAL HIGH (ref 65–99)
Potassium: 4 mmol/L (ref 3.5–5.2)
Sodium: 138 mmol/L (ref 134–144)

## 2019-12-17 ENCOUNTER — Other Ambulatory Visit: Payer: Self-pay | Admitting: Internal Medicine

## 2019-12-18 ENCOUNTER — Telehealth (HOSPITAL_COMMUNITY): Payer: Self-pay | Admitting: Emergency Medicine

## 2019-12-18 ENCOUNTER — Encounter (HOSPITAL_COMMUNITY): Payer: Self-pay

## 2019-12-18 NOTE — Telephone Encounter (Signed)
Left message on voicemail with name and callback number Tameaka Eichhorn RN Navigator Cardiac Imaging [facility] [phone] Office [phone] Cell  

## 2019-12-19 ENCOUNTER — Ambulatory Visit (HOSPITAL_COMMUNITY)
Admission: RE | Admit: 2019-12-19 | Discharge: 2019-12-19 | Disposition: A | Discharge status: Home / Self Care | Payer: Medicare Other | Source: Ambulatory Visit | Attending: Interventional Cardiology | Admitting: Interventional Cardiology

## 2019-12-19 ENCOUNTER — Other Ambulatory Visit: Payer: Self-pay

## 2019-12-19 ENCOUNTER — Telehealth: Payer: Self-pay | Admitting: Interventional Cardiology

## 2019-12-19 DIAGNOSIS — I472 Ventricular tachycardia, unspecified: Secondary | ICD-10-CM

## 2019-12-19 DIAGNOSIS — I2510 Atherosclerotic heart disease of native coronary artery without angina pectoris: Secondary | ICD-10-CM | POA: Diagnosis not present

## 2019-12-19 MED ORDER — IOHEXOL 350 MG/ML SOLN
80.0000 mL | Freq: Once | INTRAVENOUS | Status: AC | PRN
  Administered 2019-12-19: 80 mL via INTRAVENOUS

## 2019-12-19 MED ORDER — NITROGLYCERIN 0.4 MG SL SUBL
SUBLINGUAL_TABLET | SUBLINGUAL | Status: AC
  Filled 2019-12-19: qty 2

## 2019-12-19 MED ORDER — NITROGLYCERIN 0.4 MG SL SUBL
0.8000 mg | SUBLINGUAL_TABLET | Freq: Once | SUBLINGUAL | Status: AC
  Administered 2019-12-19: 0.8 mg via SUBLINGUAL

## 2019-12-19 NOTE — Telephone Encounter (Signed)
Spoke with pt and made her aware ok to take Tramadol.  Pt appreciative for call.

## 2019-12-19 NOTE — Telephone Encounter (Signed)
  Patient had a cardiac CT this morning and was given nitroglycerin and now she has a massive migraine. She states she has some Tramadol but wants to know if it is safe for her to take that or if there is something else she can take.

## 2019-12-20 ENCOUNTER — Ambulatory Visit (HOSPITAL_COMMUNITY)
Admission: RE | Admit: 2019-12-20 | Discharge: 2019-12-20 | Disposition: A | Discharge status: Home / Self Care | Payer: Medicare Other | Source: Ambulatory Visit | Attending: Interventional Cardiology | Admitting: Interventional Cardiology

## 2019-12-20 DIAGNOSIS — I472 Ventricular tachycardia: Secondary | ICD-10-CM | POA: Insufficient documentation

## 2019-12-20 DIAGNOSIS — I2510 Atherosclerotic heart disease of native coronary artery without angina pectoris: Secondary | ICD-10-CM | POA: Diagnosis not present

## 2019-12-21 DIAGNOSIS — I2510 Atherosclerotic heart disease of native coronary artery without angina pectoris: Secondary | ICD-10-CM | POA: Diagnosis not present

## 2019-12-22 DIAGNOSIS — F4321 Adjustment disorder with depressed mood: Secondary | ICD-10-CM | POA: Diagnosis not present

## 2019-12-26 ENCOUNTER — Other Ambulatory Visit: Payer: Self-pay | Admitting: Internal Medicine

## 2020-01-03 ENCOUNTER — Other Ambulatory Visit: Payer: Self-pay

## 2020-01-05 ENCOUNTER — Ambulatory Visit (INDEPENDENT_AMBULATORY_CARE_PROVIDER_SITE_OTHER): Payer: Medicare Other | Admitting: Endocrinology

## 2020-01-05 ENCOUNTER — Encounter: Payer: Self-pay | Admitting: Endocrinology

## 2020-01-05 ENCOUNTER — Other Ambulatory Visit: Payer: Self-pay

## 2020-01-05 VITALS — BP 140/72 | HR 105 | Ht 66.0 in | Wt 184.6 lb

## 2020-01-05 DIAGNOSIS — I2510 Atherosclerotic heart disease of native coronary artery without angina pectoris: Secondary | ICD-10-CM | POA: Diagnosis not present

## 2020-01-05 DIAGNOSIS — Z794 Long term (current) use of insulin: Secondary | ICD-10-CM | POA: Diagnosis not present

## 2020-01-05 DIAGNOSIS — E1151 Type 2 diabetes mellitus with diabetic peripheral angiopathy without gangrene: Secondary | ICD-10-CM | POA: Diagnosis not present

## 2020-01-05 LAB — POCT GLYCOSYLATED HEMOGLOBIN (HGB A1C): Hemoglobin A1C: 7.2 % — AB (ref 4.0–5.6)

## 2020-01-05 LAB — TSH: TSH: 2.23 u[IU]/mL (ref 0.35–4.50)

## 2020-01-05 MED ORDER — BASAGLAR KWIKPEN 100 UNIT/ML ~~LOC~~ SOPN: 30.0000 [IU] | PEN_INJECTOR | SUBCUTANEOUS | 3 refills | Status: DC

## 2020-01-05 NOTE — Patient Instructions (Addendum)
check your blood sugar twice a day.  vary the time of day when you check, between before the 3 meals, and at bedtime.  also check if you have symptoms of your blood sugar being too high or too low.  please keep a record of the readings and bring it to your next appointment here.  You can write it on any piece of paper.  please call us sooner if your blood sugar goes below 70, or if you have a lot of readings over 200.   Please continue the same medications.   Blood tests are requested for you today.  We'll let you know about the results.  Please come back for a follow-up appointment in 3-5 months.

## 2020-01-05 NOTE — Progress Notes (Signed)
Subjective:    Patient ID: Christine [name], female    DOB: 1951-04-26, 69 y.o.   MRN: [id number]  HPI Pt returns for f/u of diabetes mellitus: DM type: Insulin-requiring type 2 Dx'ed: [id number] Complications: TIA Therapy: insulin since 2014, and pioglitizone.   GDM: never. DKA: never.  Severe hypoglycemia: never.  Pancreatitis: never.   Other: She declines multiple daily injections; pioglitizone is for NASH; a trial to convert back to oral rx in 2017 was unsuccessful; she declines additional DM rx  Interval history: she brings a record of her cbg's which I have reviewed today.  cbg's vary from 74-162.  It was highest after a steroid injection into the right shoulder.  She checks fasting only.  She takes 30 units qam.  Past Medical History:  Diagnosis Date  . Acute bursitis of right shoulder 08/29/2018   Right shoulder injected in August 29, 2018  . Allergic rhinitis   . Closed displaced fracture of fifth metatarsal bone of left foot 03/16/2017  . DEPRESSIVE DISORDER NOT ELSEWHERE CLASSIFIED 03/27/2010   Qualifier: Diagnosis of  By: [name] MD, [name]   [name], NP , [facility] [place] [place]    . Diabetes ([place]) 04/02/2016  . Diabetes mellitus   . Essential hypertension 03/14/2008   Qualifier: Diagnosis of  By: [name] MD, [name]    . Fibromyalgia 08/12/2011   Diagnosed by [name] , Rheumatologist 2010   . GERD (gastroesophageal reflux disease) 12/25/2016  . Gluteal tendonitis of right buttock 10/19/2017   Injected in October 19, 2017 Injected October 07, 2018  . Greater trochanteric bursitis of left hip 06/17/2016  . Greater trochanteric bursitis of right hip 11/27/2015   Injected 11/27/2015   . History of recurrent UTIs    [name]  . HTN (hypertension)   . Hx of osteopenia 10/11/2012   [facility]; ordered by [name] Lowest T score: -2.1 @ lumbar spine @ [facility] 11/01/12; decrease of 8.5% vs 05/2009. There is 9.2% risk over 10 yrs History fracture left  elbow @ 4 Fractured left wrist , right elbow and nose post fall July/18/2013. Dr. [name] No FH Osteoporosis No PMH of bisphosphonate therapy (generic Fosamax Rxed but not filled  due to polypharmacy )   . Hyperlipidemia   . Hyperlipidemia associated with type 2 diabetes mellitus ([place]) 01/18/2007   Qualifier: Diagnosis of  By: [name]  With DM LDL goal = < 100, ideally < 70. Mi in father @ 38; 2 brothers @ 63 & 82    . Lumbar radiculopathy 04/11/2008   Qualifier: Diagnosis of  By: [name] MD, Erick Blinks well to epidural 01/12/2017  repeat epidural given May 2018  . Migraine headache    quiescent  . Nonallopathic lesion of cervical region 07/19/2014  . Nonallopathic lesion of lumbosacral region 01/15/2016  . Nonallopathic lesion of sacral region 06/27/2018  . Nonallopathic lesion-rib cage 09/26/2014  . Palpitations 04/15/2011  . Rotator cuff impingement syndrome of left shoulder 05/11/2014  . Seasonal allergic rhinitis 03/21/2012  . Sinusitis, chronic 04/20/2016  . Sleep disorder    [name] May  . Subluxation of peroneal tendon of right foot 11/10/2017  . TIA (transient ischemic attack)   . TRANSIENT ISCHEMIC ATTACKS, HX OF 02/02/2008   Qualifier: Diagnosis of  By: [name]    . UNS ADVRS EFF UNS RX MEDICINAL&BIOLOGICAL SBSTNC 02/02/2008   Qualifier: Diagnosis of  By: [name] MD, [name]    . UNSPECIFIED [place]  02/02/2008   Qualifier: Diagnosis of  By: [name] MD, [name]    . UTI (urinary tract infection) 10/05/2014  . Viral upper respiratory tract infection with cough 12/31/2014  . Vitamin D deficiency 05/25/2008   Qualifier: Diagnosis of  By: [name] MD, [name]      Past Surgical History:  Procedure Laterality Date  . COLONOSCOPY     Dr Carlean [name]; hemorrhoids  . CORONARY ANGIOPLASTY  1997   for chest pain- negative   . POLYPECTOMY  2002   benign, hyperplastic polyp; rectal bleeding 2008  . TONSILLECTOMY    . TOTAL ABDOMINAL HYSTERECTOMY  1983   BSO for  endometriosis  . WISDOM TOOTH EXTRACTION      Social History   Socioeconomic History  . Marital status: Married    Spouse name: Not on file  . Number of children: Not on file  . Years of education: Not on file  . Highest education level: Not on file  Occupational History  . 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 Smoking status: Never Smoker  . Smokeless tobacco: Never Used  Substance and Sexual Activity  . Alcohol use: No  . Drug use: No  . Sexual activity: Not on file  Other Topics Concern  . Not on file  Social History Narrative   Regular exercise- no    Social Determinants of Health   Financial Resource Strain:   . Difficulty of Paying Living Expenses: Not on file  Food Insecurity:   . Worried About Charity fundraiser in the Last Year: Not on file  . Ran Out of Food in the Last Year: Not on file  Transportation Needs:   . Lack of Transportation (Medical): Not on file  . Lack of Transportation (Non-Medical): Not on file  Physical Activity:   . Days of Exercise per Week: Not on file  . Minutes of Exercise per Session: Not on file  Stress:   . Feeling of Stress : Not on file  Social Connections:   . Frequency of Communication with Friends and Family: Not on file  . Frequency of Social Gatherings with Friends and Family: Not on file  . Attends Religious Services: Not on file  . Active Member of Clubs or Organizations: Not on file  . Attends Archivist Meetings: Not on file  . Marital Status: Not on file  Intimate Partner Violence:   . Fear of Current or Ex-Partner: Not on file  . Emotionally Abused: Not on file  . Physically Abused: Not on file  . Sexually Abused: Not on file    Current Outpatient Medications on File Prior to Visit  Medication Sig Dispense Refill  . aspirin 81 MG tablet Take 81 mg by mouth daily.      [name] atorvastatin (LIPITOR) 40 MG tablet TAKE 1 TABLET BY MOUTH AT 6 PM 90 tablet 0  .  busPIRone (BUSPAR) 15 MG tablet Take 15 mg by mouth 2 (two) times daily.    . DULoxetine (CYMBALTA) 60 MG capsule Take 60 mg by mouth daily.    . Insulin Pen Needle (BD PEN NEEDLE NANO U/F) 32G X 4 MM MISC USE ONCE A DAY AS DIRECTED 100 each 3  . Lancets (ONETOUCH ULTRASOFT) lancets Check blood sugar once daily. Dx code: 250.00 100 each 12  . LORazepam (ATIVAN) 0.5 MG tablet Take 0.5 mg by mouth 2 (two) times daily as needed. For anxiety.    [name] losartan (  COZAAR) 100 MG tablet TAKE 1 TABLET BY MOUTH EVERY DAY 90 tablet 0  . metoprolol tartrate (LOPRESSOR) 100 MG tablet Take one tablet by mouth 2 hours prior to your CT 1 tablet 0  . naproxen (NAPROSYN) 500 MG tablet TAKE 1 TABLET (500 MG TOTAL) BY MOUTH 2 (TWO) TIMES DAILY WITH A MEAL. 180 tablet 1  . omeprazole (PRILOSEC) 20 MG capsule TAKE 1 CAPSULE BY MOUTH EVERY DAY 90 capsule 3  . ONETOUCH ULTRA test strip USE 1 STRIP TWICE DAILY DX CODE E11.65 100 strip 3  . pioglitazone (ACTOS) 15 MG tablet Take 1 tablet (15 mg total) by mouth daily. 90 tablet 3  . promethazine (PHENERGAN) 12.5 MG tablet Take 1 tablet (12.5 mg total) by mouth every 8 (eight) hours as needed for nausea or vomiting. 20 tablet 8  . tiZANidine (ZANAFLEX) 2 MG tablet TAKE 1 TABLET BY MOUTH EVERY 8 HOURS AS NEEDED FOR MUSCLE SPASMS 60 tablet 0  . traMADol (ULTRAM) 50 MG tablet Take 1 tablet (50 mg total) by mouth every 6 (six) hours as needed. 20 tablet 0  . traZODone (DESYREL) 50 MG tablet TAKE 0.5-1 TABLETS (25-50 MG TOTAL) BY MOUTH AT BEDTIME AS NEEDED FOR SLEEP. 90 tablet 0  . Vitamin D, Ergocalciferol, (DRISDOL) 1.25 MG (50000 UT) CAPS capsule TAKE 1 CAPSULE BY MOUTH ONE TIME PER WEEK 12 capsule 0   No current facility-administered medications on file prior to visit.    Allergies  Allergen Reactions  . Vioxx [Rofecoxib] Other (See Comments)    Excess BP which caused TIA   . Prednisone Other (See Comments)    Mental status changes No associated rash or fever  .  Colesevelam Other (See Comments)    Leg Pain    Family History  Problem Relation Age of Onset  . Colon cancer Maternal Aunt   . Diabetes Paternal Uncle   . Heart attack Father 19  . COPD Mother   . Lung cancer Brother        smoker  . Mental illness Other        niece, committed suicide.  [name] Heart attack Other        brother X 2; @ 70 & 62  . Stroke Neg Hx     BP 140/72 (BP Location: Left Arm, Patient Position: Sitting, Cuff Size: Large)   Pulse (!) 105   Ht 5\' 6"  (1.676 m)   Wt 184 lb 9.6 oz (83.7 kg)   LMP  (LMP Unknown)   SpO2 95%   BMI 29.80 kg/m    Review of Systems She denies hypoglycemia    Objective:   Physical Exam VITAL SIGNS:  See vs page GENERAL: no distress Pulses: dorsalis pedis intact bilat.   MSK: no deformity of the feet CV: trace bilat leg edema.   Skin:  no ulcer on the feet.  normal color and temp on the feet. Neuro: sensation is intact to touch on the feet  Lab Results  Component Value Date   HGBA1C 7.2 (A) 01/05/2020       Assessment & Plan:  Insulin-requiring type 2 DM, with TIA: this is the best control this pt should aim for, given this regimen, which does match insulin to her changing needs throughout the day.  She again declines to add GLP or SGLT, in order to reduce insulin.   Tachycardia, recurrent: check TSH  Patient Instructions  check your blood sugar twice a day.  vary the time of day when  you check, between before the 3 meals, and at bedtime.  also check if you have symptoms of your blood sugar being too high or too low.  please keep a record of the readings and bring it to your next appointment here.  You can write it on any piece of paper.  please call us sooner if your blood sugar goes below 70, or if you have a lot of readings over 200.   Please continue the same medications.   Blood tests are requested for you today.  We'll let you know about the results.  Please come back for a follow-up appointment in 3-5 months.

## 2020-01-10 ENCOUNTER — Other Ambulatory Visit: Payer: Self-pay

## 2020-01-10 ENCOUNTER — Ambulatory Visit (INDEPENDENT_AMBULATORY_CARE_PROVIDER_SITE_OTHER): Payer: Medicare Other | Admitting: Family Medicine

## 2020-01-10 ENCOUNTER — Encounter: Payer: Self-pay | Admitting: Family Medicine

## 2020-01-10 VITALS — BP 118/84 | HR 82 | Ht 66.0 in | Wt 184.0 lb

## 2020-01-10 DIAGNOSIS — M5416 Radiculopathy, lumbar region: Secondary | ICD-10-CM | POA: Diagnosis not present

## 2020-01-10 DIAGNOSIS — M999 Biomechanical lesion, unspecified: Secondary | ICD-10-CM

## 2020-01-10 DIAGNOSIS — I2510 Atherosclerotic heart disease of native coronary artery without angina pectoris: Secondary | ICD-10-CM

## 2020-01-10 NOTE — Progress Notes (Signed)
[place] Phone: [phone] Subjective:   Christine [name], am serving as a scribe for Dr. Hulan Saas. This visit occurred during the SARS-CoV-2 public health emergency.  Safety protocols were in place, including screening questions prior to the visit, additional usage of staff PPE, and extensive cleaning of exam room while observing appropriate contact time as indicated for disinfecting solutions.   I'm seeing this patient by the request  of:  [name], MD  CC: Neck pain and back pain follow-up  [id number]   11/29/2019 Repeat injection given today, discussed icing regimen and home exercise, discussed which activities to do which wants to avoid.  Patient should increase activity slowly over the course the next several weeks.  Patient will follow up with me again in 4 to 8 weeks.  Update 01/10/2020 Christine [name] is a 69 y.o. female coming in with complaint of right shoulder and back pain. Patient is here for OMT. Patient states that her shoulder is doing better following the injection. In general having body aches and back pain with standing for prolonged periods.     Past Medical History:  Diagnosis Date  . Acute bursitis of right shoulder 08/29/2018   Right shoulder injected in August 29, 2018  . Allergic rhinitis   . Closed displaced fracture of fifth metatarsal bone of left foot 03/16/2017  . DEPRESSIVE DISORDER NOT ELSEWHERE CLASSIFIED 03/27/2010   Qualifier: Diagnosis of  By: [name] MD, [name]   [name], NP , [facility] [place]    . Diabetes ([place]) 04/02/2016  . Diabetes mellitus   . Essential hypertension 03/14/2008   Qualifier: Diagnosis of  By: [name] MD, [name]    . Fibromyalgia 08/12/2011   Diagnosed by [name] , Rheumatologist 2010   . GERD (gastroesophageal reflux disease) 12/25/2016  . Gluteal tendonitis of right buttock 10/19/2017   Injected in  October 19, 2017 Injected October 07, 2018  . Greater trochanteric bursitis of left hip 06/17/2016  . Greater trochanteric bursitis of right hip 11/27/2015   Injected 11/27/2015   . History of recurrent UTIs    [name]  . HTN (hypertension)   . Hx of osteopenia 10/11/2012   [facility] DEXA; ordered by [name] Lowest T score: -2.1 @ lumbar spine @ [facility] 11/01/12; decrease of 8.5% vs 05/2009. There is 9.2% risk over 10 yrs History fracture left elbow @ 4 Fractured left wrist , right elbow and nose post fall July/18/2013. Dr. [name] FH Osteoporosis [name] PMH of bisphosphonate therapy (generic Fosamax Rxed but not filled  due to polypharmacy )   . Hyperlipidemia   . Hyperlipidemia associated with type 2 diabetes mellitus ([place]) 01/18/2007   Qualifier: Diagnosis of  By: [name]  With DM LDL goal = < 100, ideally < 70. Mi in father @ 74; 2 brothers @ 51 & 43    . Lumbar radiculopathy 04/11/2008   Qualifier: Diagnosis of  By: [name] MD, Erick Blinks well to epidural 01/12/2017  repeat epidural given May 2018  . Migraine headache    quiescent  . Nonallopathic lesion of cervical region 07/19/2014  . Nonallopathic lesion of lumbosacral region 01/15/2016  . Nonallopathic lesion of sacral region 06/27/2018  . Nonallopathic lesion-rib cage 09/26/2014  . Palpitations 04/15/2011  . Rotator cuff impingement syndrome of left shoulder 05/11/2014  . Seasonal allergic rhinitis 03/21/2012  . Sinusitis, chronic 04/20/2016  . Sleep disorder  [name] May  . Subluxation of peroneal tendon of right foot 11/10/2017  . TIA (transient ischemic attack)   . TRANSIENT ISCHEMIC ATTACKS, HX OF 02/02/2008   Qualifier: Diagnosis of  By: Christine [name] Mcalpine    . UNS ADVRS EFF UNS RX MEDICINAL&BIOLOGICAL SBSTNC 02/02/2008   Qualifier: Diagnosis of  By: [name] MD, [name]    . UNSPECIFIED MYALGIA AND MYOSITIS 02/02/2008   Qualifier: Diagnosis of  By: [name] MD, [name]    . UTI (urinary tract infection) 10/05/2014  . Viral  upper respiratory tract infection with cough 12/31/2014  . Vitamin D deficiency 05/25/2008   Qualifier: Diagnosis of  By: [name] MD, [name]     Past Surgical History:  Procedure Laterality Date  . COLONOSCOPY     Dr Carlean [name]; hemorrhoids  . CORONARY ANGIOPLASTY  1997   for chest pain- negative   . POLYPECTOMY  2002   benign, hyperplastic polyp; rectal bleeding 2008  . TONSILLECTOMY    . TOTAL ABDOMINAL HYSTERECTOMY  1983   BSO for endometriosis  . WISDOM TOOTH EXTRACTION     Social History   Socioeconomic History  . Marital status: Married    Spouse name: Not on file  . Number of children: Not on file  . Years of education: Not on file  . Highest education level: Not on file  Occupational History  . 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 Smoking status: Never Smoker  . Smokeless tobacco: Never Used  Substance and Sexual Activity  . Alcohol use: [name]  . Drug use: [name]  . Sexual activity: Not on file  Other Topics Concern  . Not on file  Social History Narrative   Regular exercise- [name]    Social Determinants of Health   Financial Resource Strain:   . Difficulty of Paying Living Expenses: Not on file  Food Insecurity:   . Worried About Charity fundraiser in the Last Year: Not on file  . Ran Out of Food in the Last Year: Not on file  Transportation Needs:   . Lack of Transportation (Medical): Not on file  . Lack of Transportation (Non-Medical): Not on file  Physical Activity:   . Days of Exercise per Week: Not on file  . Minutes of Exercise per Session: Not on file  Stress:   . Feeling of Stress : Not on file  Social Connections:   . Frequency of Communication with Friends and Family: Not on file  . Frequency of Social Gatherings with Friends and Family: Not on file  . Attends Religious Services: Not on file  . Active Member of Clubs or Organizations: Not on file  . Attends Archivist Meetings: Not on file  .  Marital Status: Not on file   Allergies  Allergen Reactions  . Vioxx [Rofecoxib] Other (See Comments)    Excess BP which caused TIA   . Prednisone Other (See Comments)    Mental status changes [name] associated rash or fever  . Colesevelam Other (See Comments)    Leg Pain   Family History  Problem Relation Age of Onset  . Colon cancer Maternal Aunt   . Diabetes Paternal Uncle   . Heart attack Father 85  . COPD Mother   . Lung cancer Brother        smoker  . Mental illness Other        niece, committed suicide.  Christine [name] Kitchen Heart attack Other  brother X 2; @ 19 & 33  . Stroke Neg Hx     Current Outpatient Medications (Endocrine & Metabolic):  Christine [name] Kitchen  Insulin Glargine (BASAGLAR KWIKPEN) 100 UNIT/ML SOPN, Inject 0.3 mLs (30 Units total) into the skin every morning. .  pioglitazone (ACTOS) 15 MG tablet, Take 1 tablet (15 mg total) by mouth daily.  Current Outpatient Medications (Cardiovascular):  .  atorvastatin (LIPITOR) 40 MG tablet, TAKE 1 TABLET BY MOUTH AT 6 PM .  losartan (COZAAR) 100 MG tablet, TAKE 1 TABLET BY MOUTH EVERY DAY .  metoprolol tartrate (LOPRESSOR) 100 MG tablet, Take one tablet by mouth 2 hours prior to your CT  Current Outpatient Medications (Respiratory):  .  promethazine (PHENERGAN) 12.5 MG tablet, Take 1 tablet (12.5 mg total) by mouth every 8 (eight) hours as needed for nausea or vomiting.  Current Outpatient Medications (Analgesics):  .  aspirin 81 MG tablet, Take 81 mg by mouth daily.   .  naproxen (NAPROSYN) 500 MG tablet, TAKE 1 TABLET (500 MG TOTAL) BY MOUTH 2 (TWO) TIMES DAILY WITH A MEAL. Christine [name] Kitchen  traMADol (ULTRAM) 50 MG tablet, Take 1 tablet (50 mg total) by mouth every 6 (six) hours as needed.   Current Outpatient Medications (Other):  .  busPIRone (BUSPAR) 15 MG tablet, Take 15 mg by mouth 2 (two) times daily. .  DULoxetine (CYMBALTA) 60 MG capsule, Take 60 mg by mouth daily. .  Insulin Pen Needle (BD PEN NEEDLE NANO U/F) 32G X 4 MM MISC, USE ONCE A DAY  AS DIRECTED .  Lancets (ONETOUCH ULTRASOFT) lancets, Check blood sugar once daily. Dx code: 250.00 .  LORazepam (ATIVAN) 0.5 MG tablet, Take 0.5 mg by mouth 2 (two) times daily as needed. For anxiety. Christine [name] Kitchen  omeprazole (PRILOSEC) 20 MG capsule, TAKE 1 CAPSULE BY MOUTH EVERY DAY .  ONETOUCH ULTRA test strip, USE 1 STRIP TWICE DAILY DX CODE E11.65 .  tiZANidine (ZANAFLEX) 2 MG tablet, TAKE 1 TABLET BY MOUTH EVERY 8 HOURS AS NEEDED FOR MUSCLE SPASMS .  traZODone (DESYREL) 50 MG tablet, TAKE 0.5-1 TABLETS (25-50 MG TOTAL) BY MOUTH AT BEDTIME AS NEEDED FOR SLEEP. .  Vitamin D, Ergocalciferol, (DRISDOL) 1.25 MG (50000 UT) CAPS capsule, TAKE 1 CAPSULE BY MOUTH ONE TIME PER WEEK   Reviewed prior external information including notes and imaging from  primary care provider As well as notes that were available from care everywhere and other healthcare systems.  Past medical history, social, surgical and family history all reviewed in electronic medical record.  [name] pertanent information unless stated regarding to the chief complaint.   Review of Systems:  [name] , visual changes, nausea, vomiting, diarrhea, constipation,  abdominal pain, skin rash, fevers, chills, night sweats, weight loss, swollen lymph nodes,  joint swelling, chest pain, shortness of breath, mood changes. POSITIVE muscle aches, body aches, headaches, positive dizziness   Objective  Blood pressure 118/84, pulse 82, height 5\' 6"  (1.676 m), weight 184 lb (83.5 kg), SpO2 99 %.   General: [name] apparent distress alert and oriented x3 mood and affect normal, dressed appropriately.  HEENT: Pupils equal, extraocular movements intact  Respiratory: Patient's speak in full sentences and does not appear short of breath  Cardiovascular: [name] lower extremity edema, non tender, [name] erythema  Skin: Warm dry intact with [name] signs of infection or rash on extremities or on axial skeleton.  Abdomen: Soft nontender  Neuro: Cranial nerves II through XII are intact,  neurovascularly intact in all extremities with 2+ DTRs and  2+ pulses.  Lymph: [name] lymphadenopathy of posterior or anterior cervical chain or axillae bilaterally.  Gait normal with good balance and coordination.  MSK:  tender with full range of motion and good stability and symmetric strength and tone of shoulders, elbows, wrist, hip, knee and ankles bilaterally.  Neck: Inspection loss of lordosis . [name] palpable stepoffs. Negative Spurling's maneuver. Limited HEP  Grip strength and sensation normal in bilateral hands Strength good C4 to T1 distribution [name] sensory change to C4 to T1 Negative Hoffman sign bilaterally Reflexes normal  Back Exam:  Inspection: Unremarkable  Motion: Flexion 45 deg, Extension 25 deg, Side Bending to 35 deg bilaterally,  Rotation to 45 deg bilaterally  SLR laying: Negative  XSLR laying: Negative  Palpable tenderness: TTP SIJ bilateral  . FABER: loss of lordosis . Sensory change: Gross sensation intact to all lumbar and sacral dermatomes.  Reflexes: 2+ at both patellar tendons, 2+ at achilles tendons, Babinski's downgoing.  Strength at foot  Plantar-flexion: 5/5 Dorsi-flexion: 5/5 Eversion: 5/5 Inversion: 5/5  Leg strength  Quad: 5/5 Hamstring: 5/5 Hip flexor: 5/5 Hip abductors: 5/5  Gait unremarkable.  Osteopathic findings C2 flexed rotated and side bent right C6 flexed rotated and side bent left T3 extended rotated and side bent right inhaled third rib T9 extended rotated and side bent left L2 flexed rotated and side bent right Sacrum right on right    Impression and Recommendations:     This case required medical decision making of moderate complexity. The above documentation has been reviewed and is accurate and complete Lyndal Pulley, DO       Note: This dictation was prepared with Dragon dictation along with smaller phrase technology. Any transcriptional errors that result from this process are unintentional.

## 2020-01-10 NOTE — Assessment & Plan Note (Signed)
Chronic problem but doing relatively well at the moment.  Mild tightness but has responded well to osteopathic manipulation.  Discussed icing regimen and home exercises, which activities to do which wants to avoid.  Patient will increase activity slowly over the course the next several weeks.  Follow-up again in 4 to 8 weeks

## 2020-01-10 NOTE — Assessment & Plan Note (Signed)
Decision today to treat with OMT was based on Physical Exam  After verbal consent patient was treated with HVLA, ME, FPR techniques in cervical, thoracic, rib,  lumbar and sacral areas  Patient tolerated the procedure well with improvement in symptoms  Patient given exercises, stretches and lifestyle modifications  See medications in patient instructions if given  Patient will follow up in 4-8 weeks 

## 2020-01-10 NOTE — Patient Instructions (Signed)
You will get MyChart message when appointment available for vaccine  See me again in 4-5 weeks

## 2020-01-11 ENCOUNTER — Encounter: Payer: Self-pay | Admitting: Family Medicine

## 2020-01-15 ENCOUNTER — Other Ambulatory Visit: Payer: Self-pay | Admitting: Family Medicine

## 2020-01-24 ENCOUNTER — Encounter: Payer: Self-pay | Admitting: Family Medicine

## 2020-01-25 ENCOUNTER — Encounter: Payer: Self-pay | Admitting: Interventional Cardiology

## 2020-01-25 ENCOUNTER — Ambulatory Visit (INDEPENDENT_AMBULATORY_CARE_PROVIDER_SITE_OTHER): Payer: Medicare Other

## 2020-01-25 ENCOUNTER — Encounter: Payer: Self-pay | Admitting: Family Medicine

## 2020-01-25 ENCOUNTER — Other Ambulatory Visit: Payer: Self-pay

## 2020-01-25 ENCOUNTER — Ambulatory Visit (INDEPENDENT_AMBULATORY_CARE_PROVIDER_SITE_OTHER): Payer: Medicare Other | Admitting: Family Medicine

## 2020-01-25 VITALS — BP 122/84 | Ht 66.0 in | Wt 184.0 lb

## 2020-01-25 DIAGNOSIS — G8929 Other chronic pain: Secondary | ICD-10-CM

## 2020-01-25 DIAGNOSIS — M542 Cervicalgia: Secondary | ICD-10-CM | POA: Diagnosis not present

## 2020-01-25 DIAGNOSIS — M5030 Other cervical disc degeneration, unspecified cervical region: Secondary | ICD-10-CM | POA: Diagnosis not present

## 2020-01-25 DIAGNOSIS — M5010 Cervical disc disorder with radiculopathy, unspecified cervical region: Secondary | ICD-10-CM | POA: Diagnosis not present

## 2020-01-25 DIAGNOSIS — R519 Headache, unspecified: Secondary | ICD-10-CM

## 2020-01-25 DIAGNOSIS — I2510 Atherosclerotic heart disease of native coronary artery without angina pectoris: Secondary | ICD-10-CM | POA: Diagnosis not present

## 2020-01-25 DIAGNOSIS — M4802 Spinal stenosis, cervical region: Secondary | ICD-10-CM | POA: Diagnosis not present

## 2020-01-25 MED ORDER — METHYLPREDNISOLONE ACETATE 80 MG/ML IJ SUSP
80.0000 mg | Freq: Once | INTRAMUSCULAR | Status: AC
  Administered 2020-01-25: 80 mg via INTRAMUSCULAR

## 2020-01-25 MED ORDER — KETOROLAC TROMETHAMINE 60 MG/2ML IM SOLN
60.0000 mg | Freq: Once | INTRAMUSCULAR | Status: AC
  Administered 2020-01-25: 60 mg via INTRAMUSCULAR

## 2020-01-25 NOTE — Assessment & Plan Note (Signed)
Truly new problem.  Patient has had previous x-rays that did not show any bony abnormalities but they are years old.  We will get new ones today.  Concerned though with patient having dizziness with extension that there could be some vertebral artery insufficiency that could be contributing so some of the headaches.  Patient has had work-up for chronic headaches previously including CT angiogram of the neck and head that did show some chronic mild infarcts and some mild age-related changes.  Concerned though with patient having worsening in the progression of the headaches at the moment.  I do feel that advanced imaging is warranted including an MRI of the brain with and without contrast and an MR angiogram of the neck to further evaluate the blood supply.  Depending on findings this could change medical management.  We discussed with patient at great length that this could also be be a potential for any type of stroke and further evaluation would include CT scan which patient declined at the moment.  We discussed that if worsening symptoms that patient needs to seek medical attention immediately.  Patient is in agreement with the plan.  Toradol and Depo-Medrol given today.  This was for pain relief and low likelihood of any type of hemorrhagic and more likely ischemic.  Spent greater than 45 minutes with patient discussing all this as well as reviewing patient's charts and previous imaging.

## 2020-01-25 NOTE — Patient Instructions (Addendum)
Xray today [place] for MRI's [phone]

## 2020-01-25 NOTE — Progress Notes (Signed)
[place] Phone: [phone] Subjective:   Christine [name], am serving as a scribe for Dr. Hulan Saas. This visit occurred during the SARS-CoV-2 public health emergency.  Safety protocols were in place, including screening questions prior to the visit, additional usage of staff PPE, and extensive cleaning of exam room while observing appropriate contact time as indicated for disinfecting solutions.   I'm seeing this patient by the request  of:  [name], MD  CC: Neck pain and headache follow-up  [id number]  Christine [name] is a 69 y.o. female coming in with complaint of neck pain. Last seen on 01/10/2020 for OMT. Patient states that her pain started in the cervical spine on Monday. Pain is radiating into right arm and down into the right glute. Also having tingling in the right hand as well as into the right leg intermittently. Did clean baseboards on Saturday. Pain increases with cervical flexion. Using Tramadol once daily for pain. Also has intermittently use Zanaflex as well. Feels a weakness in her legs.  Patient is just not feeling herself.  Feels like she is weakening overall.     Past Medical History:  Diagnosis Date  . Acute bursitis of right shoulder 08/29/2018   Right shoulder injected in August 29, 2018  . Allergic rhinitis   . Closed displaced fracture of fifth metatarsal bone of left foot 03/16/2017  . DEPRESSIVE DISORDER NOT ELSEWHERE CLASSIFIED 03/27/2010   Qualifier: Diagnosis of  By: [name] MD, [name]   [name], NP , [facility] [place]    . Diabetes ([place]) 04/02/2016  . Diabetes mellitus   . Essential hypertension 03/14/2008   Qualifier: Diagnosis of  By: [name] MD, [name]    . Fibromyalgia 08/12/2011   Diagnosed by [name] , Rheumatologist 2010   . GERD (gastroesophageal reflux disease) 12/25/2016  . Gluteal tendonitis of right buttock 10/19/2017   Injected in October 19, 2017 Injected October 07, 2018  . Greater trochanteric bursitis of left hip 06/17/2016  . Greater trochanteric bursitis of right hip 11/27/2015   Injected 11/27/2015   . History of recurrent UTIs    [name]  . HTN (hypertension)   . Hx of osteopenia 10/11/2012   [facility] DEXA; ordered by [name] Lowest T score: -2.1 @ lumbar spine @ [facility] 11/01/12; decrease of 8.5% vs 05/2009. There is 9.2% risk over 10 yrs History fracture left elbow @ 4 Fractured left wrist , right elbow and nose post fall July/18/2013. Dr. [name] FH Osteoporosis [name] PMH of bisphosphonate therapy (generic Fosamax Rxed but not filled  due to polypharmacy )   . Hyperlipidemia   . Hyperlipidemia associated with type 2 diabetes mellitus ([place]) 01/18/2007   Qualifier: Diagnosis of  By: [name]  With DM LDL goal = < 100, ideally < 70. Mi in father @ 11; 2 brothers @ 61 & 65    . Lumbar radiculopathy 04/11/2008   Qualifier: Diagnosis of  By: [name] MD, Erick Blinks well to epidural 01/12/2017  repeat epidural given May 2018  . Migraine headache    quiescent  . Nonallopathic lesion of cervical region 07/19/2014  . Nonallopathic lesion of lumbosacral region 01/15/2016  . Nonallopathic lesion of sacral region 06/27/2018  . Nonallopathic lesion-rib cage 09/26/2014  . Palpitations 04/15/2011  . Rotator cuff impingement syndrome of left shoulder 05/11/2014  . Seasonal allergic rhinitis 03/21/2012  . Sinusitis, chronic 04/20/2016  .  Sleep disorder    [name] May  . Subluxation of peroneal tendon of right foot 11/10/2017  . TIA (transient ischemic attack)   . TRANSIENT ISCHEMIC ATTACKS, HX OF 02/02/2008   Qualifier: Diagnosis of  By: [name]    . UNS ADVRS EFF UNS RX MEDICINAL&BIOLOGICAL SBSTNC 02/02/2008   Qualifier: Diagnosis of  By: [name] MD, [name]    . UNSPECIFIED MYALGIA AND MYOSITIS 02/02/2008   Qualifier: Diagnosis of  By: [name] MD, [name]    . UTI (urinary tract infection) 10/05/2014   . Viral upper respiratory tract infection with cough 12/31/2014  . Vitamin D deficiency 05/25/2008   Qualifier: Diagnosis of  By: [name] MD, [name]     Past Surgical History:  Procedure Laterality Date  . COLONOSCOPY     Dr Carlean [name]; hemorrhoids  . CORONARY ANGIOPLASTY  1997   for chest pain- negative   . POLYPECTOMY  2002   benign, hyperplastic polyp; rectal bleeding 2008  . TONSILLECTOMY    . TOTAL ABDOMINAL HYSTERECTOMY  1983   BSO for endometriosis  . WISDOM TOOTH EXTRACTION     Social History   Socioeconomic History  . Marital status: Married    Spouse name: Not on file  . Number of children: Not on file  . Years of education: Not on file  . Highest education level: Not on file  Occupational History  . 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 Smoking status: Never Smoker  . Smokeless tobacco: Never Used  Substance and Sexual Activity  . Alcohol use: [name]  . Drug use: [name]  . Sexual activity: Not on file  Other Topics Concern  . Not on file  Social History Narrative   Regular exercise- [name]    Social Determinants of Health   Financial Resource Strain:   . Difficulty of Paying Living Expenses: Not on file  Food Insecurity:   . Worried About Charity fundraiser in the Last Year: Not on file  . Ran Out of Food in the Last Year: Not on file  Transportation Needs:   . Lack of Transportation (Medical): Not on file  . Lack of Transportation (Non-Medical): Not on file  Physical Activity:   . Days of Exercise per Week: Not on file  . Minutes of Exercise per Session: Not on file  Stress:   . Feeling of Stress : Not on file  Social Connections:   . Frequency of Communication with Friends and Family: Not on file  . Frequency of Social Gatherings with Friends and Family: Not on file  . Attends Religious Services: Not on file  . Active Member of Clubs or Organizations: Not on file  . Attends Archivist Meetings: Not  on file  . Marital Status: Not on file   Allergies  Allergen Reactions  . Vioxx [Rofecoxib] Other (See Comments)    Excess BP which caused TIA   . Prednisone Other (See Comments)    Mental status changes [name] associated rash or fever  . Colesevelam Other (See Comments)    Leg Pain   Family History  Problem Relation Age of Onset  . Colon cancer Maternal Aunt   . Diabetes Paternal Uncle   . Heart attack Father 62  . COPD Mother   . Lung cancer Brother        smoker  . Mental illness Other        niece, committed suicide.  [name] Heart  attack Other        brother X 2; @ 47 & 63  . Stroke Neg Hx     Current Outpatient Medications (Endocrine & Metabolic):  [name]  Insulin Glargine (BASAGLAR KWIKPEN) 100 UNIT/ML SOPN, Inject 0.3 mLs (30 Units total) into the skin every morning. .  pioglitazone (ACTOS) 15 MG tablet, Take 1 tablet (15 mg total) by mouth daily.  Current Outpatient Medications (Cardiovascular):  .  atorvastatin (LIPITOR) 40 MG tablet, TAKE 1 TABLET BY MOUTH AT 6 PM .  losartan (COZAAR) 100 MG tablet, TAKE 1 TABLET BY MOUTH EVERY DAY .  metoprolol tartrate (LOPRESSOR) 100 MG tablet, Take one tablet by mouth 2 hours prior to your CT  Current Outpatient Medications (Respiratory):  .  promethazine (PHENERGAN) 12.5 MG tablet, Take 1 tablet (12.5 mg total) by mouth every 8 (eight) hours as needed for nausea or vomiting.  Current Outpatient Medications (Analgesics):  .  aspirin 81 MG tablet, Take 81 mg by mouth daily.   .  naproxen (NAPROSYN) 500 MG tablet, TAKE 1 TABLET (500 MG TOTAL) BY MOUTH 2 (TWO) TIMES DAILY WITH A MEAL. [name]  traMADol (ULTRAM) 50 MG tablet, Take 1 tablet (50 mg total) by mouth every 6 (six) hours as needed.   Current Outpatient Medications (Other):  .  busPIRone (BUSPAR) 15 MG tablet, Take 15 mg by mouth 2 (two) times daily. .  DULoxetine (CYMBALTA) 60 MG capsule, Take 60 mg by mouth daily. .  Insulin Pen Needle (BD PEN NEEDLE NANO U/F) 32G X 4 MM MISC, USE  ONCE A DAY AS DIRECTED .  Lancets (ONETOUCH ULTRASOFT) lancets, Check blood sugar once daily. Dx code: 250.00 .  LORazepam (ATIVAN) 0.5 MG tablet, Take 0.5 mg by mouth 2 (two) times daily as needed. For anxiety. [name]  omeprazole (PRILOSEC) 20 MG capsule, TAKE 1 CAPSULE BY MOUTH EVERY DAY .  ONETOUCH ULTRA test strip, USE 1 STRIP TWICE DAILY DX CODE E11.65 .  tiZANidine (ZANAFLEX) 2 MG tablet, TAKE 1 TABLET BY MOUTH EVERY 8 HOURS AS NEEDED FOR MUSCLE SPASMS .  traZODone (DESYREL) 50 MG tablet, TAKE 0.5-1 TABLETS (25-50 MG TOTAL) BY MOUTH AT BEDTIME AS NEEDED FOR SLEEP. .  Vitamin D, Ergocalciferol, (DRISDOL) 1.25 MG (50000 UNIT) CAPS capsule, TAKE 1 CAPSULE BY MOUTH ONE TIME PER WEEK   Reviewed prior external information including notes and imaging from  primary care provider As well as notes that were available from care everywhere and other healthcare systems.  Past medical history, social, surgical and family history all reviewed in electronic medical record.  [name] pertanent information unless stated regarding to the chief complaint.   Review of Systems:  [name] , visual changes, nausea, vomiting, diarrhea, constipation, , abdominal pain, skin rash, fevers, chills, night sweats, weight loss, swollen lymph nodes, body aches, joint swelling, chest pain, shortness of breath, mood changes. POSITIVE muscle aches, body aches, headache, dizziness  Objective  Blood pressure 122/84, height 5\' 6"  (1.676 m), weight 184 lb (83.5 kg).   General: [name] apparent distress alert and oriented x3 mood and affect normal, dressed appropriately.  HEENT: Pupils equal, extraocular movements intact  Respiratory: Patient's speak in full sentences and does not appear short of breath  Cardiovascular: [name] lower extremity edema, non tender, [name] erythema  Skin: Warm dry intact with [name] signs of infection or rash on extremities or on axial skeleton.  Abdomen: Soft nontender  Neuro: Cranial nerves II through XII are intact,  neurovascularly intact in all extremities with  2+ DTRs and 2+ pulses.  Lymph: [name] lymphadenopathy of posterior or anterior cervical chain or axillae bilaterally.  Gait normal with good balance and coordination.  MSK: Diffuse tenderness in multiple muscles and joints today.  More around the shoulders and the neck.  Patient's neck exam has a positive Spurling's with radicular symptoms in the C8 distribution.  Patient does have weakness in the C8 distribution noted on the right side.  Deep tendon reflexes though are intact.  Patient will have an extension of the Patient did get some dizziness every time and seemed when greater than 15 degrees of extension.    Impression and Recommendations:     This case required medical decision making of moderate complexity. The above documentation has been reviewed and is accurate and complete Lyndal Pulley, DO       Note: This dictation was prepared with Dragon dictation along with smaller phrase technology. Any transcriptional errors that result from this process are unintentional.

## 2020-01-26 ENCOUNTER — Telehealth: Payer: Self-pay | Admitting: Family Medicine

## 2020-01-26 NOTE — Telephone Encounter (Signed)
Patient called stating that she received a call from [facility] to schedule her MRI and steroid injection in her neck. She did not know anything about getting the steroid injection and was confused about why it was scheduled so far out. I explained to her that with those types of injections, they need MRI images prior to the procedure. She expressed understanding and said that it all made since to her now. She did want to let Dr Tamala Julian know that the MRI is not until March 2 and the injection is scheduled for March 8.  She still has a follow up scheduled with Dr Tamala Julian on 02/08/2020. Will that need to be moved out after the MRI and injection?

## 2020-01-29 ENCOUNTER — Other Ambulatory Visit: Payer: Self-pay | Admitting: Family Medicine

## 2020-01-29 NOTE — Telephone Encounter (Signed)
Left message for patient to call back to discuss Dr. Thompson Caul plan.

## 2020-01-29 NOTE — Telephone Encounter (Signed)
Sure

## 2020-01-29 NOTE — Telephone Encounter (Signed)
I would say waiting until after imaging would be a good idea. The calcification in the carotid will be better evaluated with the MRI.

## 2020-01-30 NOTE — Telephone Encounter (Signed)
MyChart message sent to patient to cancel epidural.

## 2020-02-08 ENCOUNTER — Ambulatory Visit: Payer: Medicare Other | Admitting: Family Medicine

## 2020-02-11 ENCOUNTER — Ambulatory Visit: Payer: Medicare Other | Attending: Internal Medicine

## 2020-02-11 ENCOUNTER — Other Ambulatory Visit: Payer: Self-pay

## 2020-02-11 DIAGNOSIS — Z23 Encounter for immunization: Secondary | ICD-10-CM | POA: Insufficient documentation

## 2020-02-11 NOTE — Progress Notes (Signed)
   Covid-19 Vaccination Clinic  Name:  Christine [name]    MRN: [id number] DOB: 12/23/1950  02/11/2020  [name] was observed post Covid-19 immunization for 15 minutes without incidence. She was provided with Vaccine Information Sheet and instruction to access the V-Safe system.   [name] was instructed to call 911 with any severe reactions post vaccine: [name] Difficulty breathing  . Swelling of your face and throat  . A fast heartbeat  . A bad rash all over your body  . Dizziness and weakness    Immunizations Administered    Name Date Dose VIS Date Route   Pfizer COVID-19 Vaccine 02/11/2020  2:13 PM 0.3 mL 11/24/2019 Intramuscular   Manufacturer: [place]   Lot: [id number]   [place]: [id number]

## 2020-02-12 ENCOUNTER — Encounter: Payer: Self-pay | Admitting: Family Medicine

## 2020-02-13 ENCOUNTER — Other Ambulatory Visit: Payer: Self-pay

## 2020-02-13 ENCOUNTER — Ambulatory Visit
Admission: RE | Admit: 2020-02-13 | Discharge: 2020-02-13 | Disposition: A | Discharge status: Home / Self Care | Payer: Medicare Other | Source: Ambulatory Visit | Attending: Family Medicine | Admitting: Family Medicine

## 2020-02-13 DIAGNOSIS — G8929 Other chronic pain: Secondary | ICD-10-CM

## 2020-02-13 DIAGNOSIS — M5030 Other cervical disc degeneration, unspecified cervical region: Secondary | ICD-10-CM

## 2020-02-13 DIAGNOSIS — M50221 Other cervical disc displacement at C4-C5 level: Secondary | ICD-10-CM | POA: Diagnosis not present

## 2020-02-13 DIAGNOSIS — M4802 Spinal stenosis, cervical region: Secondary | ICD-10-CM | POA: Diagnosis not present

## 2020-02-13 DIAGNOSIS — R42 Dizziness and giddiness: Secondary | ICD-10-CM | POA: Diagnosis not present

## 2020-02-13 DIAGNOSIS — R519 Headache, unspecified: Secondary | ICD-10-CM

## 2020-02-13 MED ORDER — GADOBENATE DIMEGLUMINE 529 MG/ML IV SOLN
17.0000 mL | Freq: Once | INTRAVENOUS | Status: AC | PRN
  Administered 2020-02-13: 14:00:00 17 mL via INTRAVENOUS

## 2020-02-16 ENCOUNTER — Encounter: Payer: Self-pay | Admitting: Family Medicine

## 2020-02-16 ENCOUNTER — Other Ambulatory Visit: Payer: Self-pay

## 2020-02-16 ENCOUNTER — Ambulatory Visit (INDEPENDENT_AMBULATORY_CARE_PROVIDER_SITE_OTHER): Payer: Medicare Other | Admitting: Family Medicine

## 2020-02-16 VITALS — BP 130/82 | HR 98 | Ht 66.0 in | Wt 184.0 lb

## 2020-02-16 DIAGNOSIS — M7061 Trochanteric bursitis, right hip: Secondary | ICD-10-CM

## 2020-02-16 DIAGNOSIS — I2510 Atherosclerotic heart disease of native coronary artery without angina pectoris: Secondary | ICD-10-CM | POA: Diagnosis not present

## 2020-02-16 DIAGNOSIS — M5010 Cervical disc disorder with radiculopathy, unspecified cervical region: Secondary | ICD-10-CM

## 2020-02-16 DIAGNOSIS — Z8679 Personal history of other diseases of the circulatory system: Secondary | ICD-10-CM

## 2020-02-16 DIAGNOSIS — M542 Cervicalgia: Secondary | ICD-10-CM | POA: Diagnosis not present

## 2020-02-16 NOTE — Assessment & Plan Note (Signed)
Carotid Doppler ordered

## 2020-02-16 NOTE — Assessment & Plan Note (Signed)
Chronic problem with exacerbation.  Differential includes lumbar radiculopathy.  Discussed avoiding certain activities, icing regimen, home exercises.  Follow-up again in 4 to 6 weeks

## 2020-02-16 NOTE — Assessment & Plan Note (Signed)
MRI consistent with patient's symptoms.  We discussed with patient in great length about different treatment options and patient has elected to move forward with a epidural.  I believe patient will should probably have the most benefit from this.  Discussed posture and ergonomics, discussed that after the epidural we will see patient again 2 weeks later and see how patient responds.

## 2020-02-16 NOTE — Progress Notes (Signed)
[name] Phone: [phone] Subjective:   Christine [name], am serving as a scribe for Dr. Hulan Saas. This visit occurred during the SARS-CoV-2 public health emergency.  Safety protocols were in place, including screening questions prior to the visit, additional usage of staff PPE, and extensive cleaning of exam room while observing appropriate contact time as indicated for disinfecting solutions.   I'm seeing this patient by the request  of:  [name], MD  CC: Neck pain and headache follow-up  [id number]   01/25/2020 Truly new problem.  Patient has had previous x-rays that did not show any bony abnormalities but they are years old.  We will get new ones today.  Concerned though with patient having dizziness with extension that there could be some vertebral artery insufficiency that could be contributing so some of the headaches.  Patient has had work-up for chronic headaches previously including CT angiogram of the neck and head that did show some chronic mild infarcts and some mild age-related changes.  Concerned though with patient having worsening in the progression of the headaches at the moment.  I do feel that advanced imaging is warranted including an MRI of the brain with and without contrast and an MR angiogram of the neck to further evaluate the blood supply.  Depending on findings this could change medical management.  We discussed with patient at great length that this could also be be a potential for any type of stroke and further evaluation would include CT scan which patient declined at the moment.  We discussed that if worsening symptoms that patient needs to seek medical attention immediately.  Patient is in agreement with the plan.  Toradol and Depo-Medrol given today.  This was for pain relief and low likelihood of any type of hemorrhagic and more likely ischemic.  Spent greater than 45  minutes with patient discussing all this as well as reviewing patient's charts and previous imaging.  Update 02/16/2020 Christine [name] Back is a 69 y.o. female coming in with complaint of cervical spine pain. Patient states that she is using naprosen in the mornings and has to use Tramadol by 3pm and then another before bed with her tizanidine. Is able to sleep. Today is feeling very dizzy. Has been having good and bad days with this symptom. Patient states that the pain seems to be unrelenting and still affecting daily activities.    MRI 3/2/2021IMPRESSION: MRI HEAD IMPRESSION:  1. [name] acute intracranial abnormality. 2. Small chronic right frontal lobe infarct, stable from previous. 3. Age-related cerebral atrophy with mild chronic microvascular ischemic disease, mildly progressed relative to most recent available MRI from 2017.  MRI CERVICAL SPINE IMPRESSION:  1. Multilevel cervical spondylosis with resultant mild diffuse spinal stenosis at C4-5 through C6-7. 2. Multifactorial degenerative changes with resultant moderate right with mild left C5 through C7 foraminal stenosis. Findings could contribute to right-sided radicular symptoms. 3. Mild reactive edema about the right C4-5 facet due to facet arthritis. Finding could contribute to underlying neck pain.   Past Medical History:  Diagnosis Date  . Acute bursitis of right shoulder 08/29/2018   Right shoulder injected in August 29, 2018  . Allergic rhinitis   . Closed displaced fracture of fifth metatarsal bone of left foot 03/16/2017  . DEPRESSIVE DISORDER NOT ELSEWHERE CLASSIFIED 03/27/2010   Qualifier: Diagnosis of  By: [name] MD, [name]   Mr Christine Manila, NP , [facility] [place],  [place]    . Diabetes ([place]) 04/02/2016  . Diabetes mellitus   . Essential hypertension 03/14/2008   Qualifier: Diagnosis of  By: [name] MD, [name]    . Fibromyalgia 08/12/2011   Diagnosed by [name] , Rheumatologist 2010   . GERD  (gastroesophageal reflux disease) 12/25/2016  . Gluteal tendonitis of right buttock 10/19/2017   Injected in October 19, 2017 Injected October 07, 2018  . Greater trochanteric bursitis of left hip 06/17/2016  . Greater trochanteric bursitis of right hip 11/27/2015   Injected 11/27/2015   . History of recurrent UTIs    [name]  . HTN (hypertension)   . Hx of osteopenia 10/11/2012   [facility] DEXA; ordered by [name] Lowest T score: -2.1 @ lumbar spine @ [facility] 11/01/12; decrease of 8.5% vs 05/2009. There is 9.2% risk over 10 yrs History fracture left elbow @ 4 Fractured left wrist , right elbow and nose post fall July/18/2013. Dr. [name] FH Osteoporosis [name] PMH of bisphosphonate therapy (generic Fosamax Rxed but not filled  due to polypharmacy )   . Hyperlipidemia   . Hyperlipidemia associated with type 2 diabetes mellitus ([place]) 01/18/2007   Qualifier: Diagnosis of  By: [name]  With DM LDL goal = < 100, ideally < 70. Mi in father @ 74; 2 brothers @ 81 & 24    . Lumbar radiculopathy 04/11/2008   Qualifier: Diagnosis of  By: [name] MD, Erick Blinks well to epidural 01/12/2017  repeat epidural given May 2018  . Migraine headache    quiescent  . Nonallopathic lesion of cervical region 07/19/2014  . Nonallopathic lesion of lumbosacral region 01/15/2016  . Nonallopathic lesion of sacral region 06/27/2018  . Nonallopathic lesion-rib cage 09/26/2014  . Palpitations 04/15/2011  . Rotator cuff impingement syndrome of left shoulder 05/11/2014  . Seasonal allergic rhinitis 03/21/2012  . Sinusitis, chronic 04/20/2016  . Sleep disorder    [name] May  . Subluxation of peroneal tendon of right foot 11/10/2017  . TIA (transient ischemic attack)   . TRANSIENT ISCHEMIC ATTACKS, HX OF 02/02/2008   Qualifier: Diagnosis of  By: [name]    . UNS ADVRS EFF UNS RX MEDICINAL&BIOLOGICAL SBSTNC 02/02/2008   Qualifier: Diagnosis of  By: [name] MD, [name]    . UNSPECIFIED MYALGIA AND MYOSITIS 02/02/2008     Qualifier: Diagnosis of  By: [name] MD, [name]    . UTI (urinary tract infection) 10/05/2014  . Viral upper respiratory tract infection with cough 12/31/2014  . Vitamin D deficiency 05/25/2008   Qualifier: Diagnosis of  By: [name] MD, [name]     Past Surgical History:  Procedure Laterality Date  . COLONOSCOPY     Dr Carlean [name]; hemorrhoids  . CORONARY ANGIOPLASTY  1997   for chest pain- negative   . POLYPECTOMY  2002   benign, hyperplastic polyp; rectal bleeding 2008  . TONSILLECTOMY    . TOTAL ABDOMINAL HYSTERECTOMY  1983   BSO for endometriosis  . WISDOM TOOTH EXTRACTION     Social History   Socioeconomic History  . Marital status: Married    Spouse name: Not on file  . Number of children: Not on file  . Years of education: Not on file  . Highest education level: Not on file  Occupational History  . 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 Smoking status: Never Smoker  . Smokeless tobacco: Never Used  Substance and Sexual Activity  . Alcohol  use: [name]  . Drug use: [name]  . Sexual activity: Not on file  Other Topics Concern  . Not on file  Social History Narrative   Regular exercise- [name]    Social Determinants of Health   Financial Resource Strain:   . Difficulty of Paying Living Expenses: Not on file  Food Insecurity:   . Worried About Charity fundraiser in the Last Year: Not on file  . Ran Out of Food in the Last Year: Not on file  Transportation Needs:   . Lack of Transportation (Medical): Not on file  . Lack of Transportation (Non-Medical): Not on file  Physical Activity:   . Days of Exercise per Week: Not on file  . Minutes of Exercise per Session: Not on file  Stress:   . Feeling of Stress : Not on file  Social Connections:   . Frequency of Communication with Friends and Family: Not on file  . Frequency of Social Gatherings with Friends and Family: Not on file  . Attends Religious Services: Not on file  .  Active Member of Clubs or Organizations: Not on file  . Attends Archivist Meetings: Not on file  . Marital Status: Not on file   Allergies  Allergen Reactions  . Vioxx [Rofecoxib] Other (See Comments)    Excess BP which caused TIA   . Prednisone Other (See Comments)    Mental status changes [name] associated rash or fever  . Colesevelam Other (See Comments)    Leg Pain   Family History  Problem Relation Age of Onset  . Colon cancer Maternal Aunt   . Diabetes Paternal Uncle   . Heart attack Father 20  . COPD Mother   . Lung cancer Brother        smoker  . Mental illness Other        niece, committed suicide.  [name] Heart attack Other        brother X 2; @ 17 & 44  . Stroke Neg Hx     Current Outpatient Medications (Endocrine & Metabolic):  [name]  Insulin Glargine (BASAGLAR KWIKPEN) 100 UNIT/ML SOPN, Inject 0.3 mLs (30 Units total) into the skin every morning. .  pioglitazone (ACTOS) 15 MG tablet, Take 1 tablet (15 mg total) by mouth daily.  Current Outpatient Medications (Cardiovascular):  .  atorvastatin (LIPITOR) 40 MG tablet, TAKE 1 TABLET BY MOUTH AT 6 PM .  losartan (COZAAR) 100 MG tablet, TAKE 1 TABLET BY MOUTH EVERY DAY .  metoprolol tartrate (LOPRESSOR) 100 MG tablet, Take one tablet by mouth 2 hours prior to your CT  Current Outpatient Medications (Respiratory):  .  promethazine (PHENERGAN) 12.5 MG tablet, Take 1 tablet (12.5 mg total) by mouth every 8 (eight) hours as needed for nausea or vomiting.  Current Outpatient Medications (Analgesics):  .  aspirin 81 MG tablet, Take 81 mg by mouth daily.   .  naproxen (NAPROSYN) 500 MG tablet, TAKE 1 TABLET (500 MG TOTAL) BY MOUTH 2 (TWO) TIMES DAILY WITH A MEAL. [name]  traMADol (ULTRAM) 50 MG tablet, TAKE 1 TABLET (50 MG TOTAL) BY MOUTH EVERY 6 (SIX) HOURS AS NEEDED.   Current Outpatient Medications (Other):  .  busPIRone (BUSPAR) 15 MG tablet, Take 15 mg by mouth 2 (two) times daily. .  DULoxetine (CYMBALTA) 60 MG  capsule, Take 60 mg by mouth daily. .  Insulin Pen Needle (BD PEN NEEDLE NANO U/F) 32G X 4 MM MISC, USE ONCE A DAY AS DIRECTED .  Lancets (ONETOUCH ULTRASOFT) lancets, Check blood sugar once daily. Dx code: 250.00 .  LORazepam (ATIVAN) 0.5 MG tablet, Take 0.5 mg by mouth 2 (two) times daily as needed. For anxiety. [name]  omeprazole (PRILOSEC) 20 MG capsule, TAKE 1 CAPSULE BY MOUTH EVERY DAY .  ONETOUCH ULTRA test strip, USE 1 STRIP TWICE DAILY DX CODE E11.65 .  tiZANidine (ZANAFLEX) 2 MG tablet, TAKE 1 TABLET BY MOUTH EVERY 8 HOURS AS NEEDED FOR MUSCLE SPASMS .  traZODone (DESYREL) 50 MG tablet, TAKE 0.5-1 TABLETS (25-50 MG TOTAL) BY MOUTH AT BEDTIME AS NEEDED FOR SLEEP. .  Vitamin D, Ergocalciferol, (DRISDOL) 1.25 MG (50000 UNIT) CAPS capsule, TAKE 1 CAPSULE BY MOUTH ONE TIME PER WEEK   Reviewed prior external information including notes and imaging from  primary care provider As well as notes that were available from care everywhere and other healthcare systems.  Past medical history, social, surgical and family history all reviewed in electronic medical record.  [name] pertanent information unless stated regarding to the chief complaint.   Review of Systems:  [name] , visual changes, nausea, vomiting, diarrhea, constipation, , abdominal pain, skin rash, fevers, chills, night sweats, weight loss, swollen lymph nodes, b, joint swelling, chest pain, shortness of breath, mood changes. POSITIVE muscle aches, body aches, headache, dizziness  Objective  Blood pressure 130/82, pulse 98, height 5\' 6"  (1.676 m), weight 184 lb (83.5 kg), SpO2 99 %.   General: [name] apparent distress alert and oriented x3 mood and affect normal, dressed appropriately.  HEENT: Pupils equal, extraocular movements intact  Respiratory: Patient's speak in full sentences and does not appear short of breath  Cardiovascular: [name] lower extremity edema, non tender, [name] erythema  Skin: Warm dry intact with [name] signs of infection or rash on  extremities or on axial skeleton.  Abdomen: Soft nontender  Neuro: Cranial nerves II through XII are intact, neurovascularly intact in all extremities with 2+ DTRs and 2+ pulses.  Lymph: [name] lymphadenopathy of posterior or anterior cervical chain or axillae bilaterally.  Gait normal with good balance and coordination.  MSK: Right hip does have significant pain over the greater trochanteric area.  Negative straight leg test.  Minimal pain over the piriformis or gluteal tendon where patient has had pain previously.  Neck exam patient still has severe tenderness to even light palpation right greater than left.  Mild positive Spurling's on the right   After verbal consent patient was prepped with alcohol swab and with a 21-gauge 2 inch needle injected into the right greater trochanteric area with a total of 2 cc of 0.5% Marcaine and 1 cc of Kenalog 40 mg/mL.  [name] blood loss.  Postinjection instructions given Impression and Recommendations:     This case required medical decision making of moderate complexity. The above documentation has been reviewed and is accurate and complete Lyndal Pulley, DO       Note: This dictation was prepared with Dragon dictation along with smaller phrase technology. Any transcriptional errors that result from this process are unintentional.

## 2020-02-16 NOTE — Patient Instructions (Addendum)
Epidural C7 T1 Carotid doppler [facility] [id number] See me 2-3 weeks after epidural

## 2020-02-19 ENCOUNTER — Other Ambulatory Visit: Payer: Medicare Other

## 2020-02-21 ENCOUNTER — Other Ambulatory Visit: Payer: Self-pay | Admitting: Family Medicine

## 2020-02-21 ENCOUNTER — Ambulatory Visit
Admission: RE | Admit: 2020-02-21 | Discharge: 2020-02-21 | Disposition: A | Discharge status: Home / Self Care | Payer: Medicare Other | Source: Ambulatory Visit | Attending: Family Medicine | Admitting: Family Medicine

## 2020-02-21 DIAGNOSIS — I6523 Occlusion and stenosis of bilateral carotid arteries: Secondary | ICD-10-CM | POA: Diagnosis not present

## 2020-02-21 DIAGNOSIS — M542 Cervicalgia: Secondary | ICD-10-CM

## 2020-02-21 DIAGNOSIS — Z8679 Personal history of other diseases of the circulatory system: Secondary | ICD-10-CM

## 2020-02-22 ENCOUNTER — Ambulatory Visit
Admission: RE | Admit: 2020-02-22 | Discharge: 2020-02-22 | Disposition: A | Discharge status: Home / Self Care | Payer: Medicare Other | Source: Ambulatory Visit | Attending: Family Medicine | Admitting: Family Medicine

## 2020-02-22 ENCOUNTER — Other Ambulatory Visit: Payer: Self-pay

## 2020-02-22 DIAGNOSIS — M549 Dorsalgia, unspecified: Secondary | ICD-10-CM | POA: Diagnosis not present

## 2020-02-22 DIAGNOSIS — M542 Cervicalgia: Secondary | ICD-10-CM

## 2020-02-22 DIAGNOSIS — M25511 Pain in right shoulder: Secondary | ICD-10-CM | POA: Diagnosis not present

## 2020-02-22 MED ORDER — IOPAMIDOL (ISOVUE-M 300) INJECTION 61%
1.0000 mL | Freq: Once | INTRAMUSCULAR | Status: AC
  Administered 2020-02-22: 1 mL via EPIDURAL

## 2020-02-22 MED ORDER — TRIAMCINOLONE ACETONIDE 40 MG/ML IJ SUSP (RADIOLOGY)
60.0000 mg | Freq: Once | INTRAMUSCULAR | Status: AC
  Administered 2020-02-22: 60 mg via EPIDURAL

## 2020-02-22 NOTE — Discharge Instructions (Signed)

## 2020-02-23 ENCOUNTER — Encounter: Payer: Self-pay | Admitting: Endocrinology

## 2020-02-28 ENCOUNTER — Other Ambulatory Visit: Payer: Self-pay | Admitting: Endocrinology

## 2020-02-29 ENCOUNTER — Telehealth: Payer: Self-pay | Admitting: Internal Medicine

## 2020-02-29 NOTE — Progress Notes (Signed)
  Chronic Care Management   Outreach Note  02/29/2020 Name: Christine [name] MRN: [id number] DOB: 1951/01/30  Referred by: [name], MD Reason for referral : No chief complaint on file.   An unsuccessful telephone outreach was attempted today. The patient was referred to the pharmacist for assistance with care management and care coordination.   Follow Up Plan:   Raynicia Dukes [employer] Scheduler

## 2020-03-05 ENCOUNTER — Telehealth: Payer: Self-pay | Admitting: Internal Medicine

## 2020-03-05 DIAGNOSIS — I25119 Atherosclerotic heart disease of native coronary artery with unspecified angina pectoris: Secondary | ICD-10-CM

## 2020-03-05 NOTE — Progress Notes (Signed)
°  Chronic Care Management   Note  03/05/2020 Name: Efrata Genther MRN: [id number] DOB: 1951-06-24  Raymie Stutts Reutov is a 69 y.o. year old female who is a primary care patient of [name], MD. I reached out to Dennard Nip by phone today in response to a referral sent by Ms. Maylen Stutts [place]ke[place]'s PCP, [name], MD.   Ms. Greulich was given information about Chronic Care Management services today including:  1. CCM service includes personalized support from designated clinical staff supervised by her physician, including individualized plan of care and coordination with other care providers 2. 24/7 contact phone numbers for assistance for urgent and routine care needs. 3. Service will only be billed when office clinical staff spend 20 minutes or more in a month to coordinate care. 4. Only one practitioner may furnish and bill the service in a calendar month. 5. The patient may stop CCM services at any time (effective at the end of the month) by phone call to the office staff.   Patient agreed to services and verbal consent obtained.   Follow up plan:   Raynicia Dukes [employer] Scheduler

## 2020-03-07 ENCOUNTER — Ambulatory Visit (INDEPENDENT_AMBULATORY_CARE_PROVIDER_SITE_OTHER): Payer: Medicare Other | Admitting: Family Medicine

## 2020-03-07 ENCOUNTER — Encounter: Payer: Self-pay | Admitting: Family Medicine

## 2020-03-07 ENCOUNTER — Other Ambulatory Visit: Payer: Self-pay

## 2020-03-07 DIAGNOSIS — I2510 Atherosclerotic heart disease of native coronary artery without angina pectoris: Secondary | ICD-10-CM

## 2020-03-07 DIAGNOSIS — M5010 Cervical disc disorder with radiculopathy, unspecified cervical region: Secondary | ICD-10-CM | POA: Diagnosis not present

## 2020-03-07 NOTE — Progress Notes (Signed)
[place] Phone: [phone] Subjective:   Christine [name], am serving as a scribe for Dr. Hulan Saas. This visit occurred during the SARS-CoV-2 public health emergency.  Safety protocols were in place, including screening questions prior to the visit, additional usage of staff PPE, and extensive cleaning of exam room while observing appropriate contact time as indicated for disinfecting solutions.   I'm seeing this patient by the request  of:  [name], MD  CC: Neck pain follow-up  [id number]   02/16/2020 Carotid Doppler ordered  Chronic problem with exacerbation.  Differential includes lumbar radiculopathy.  Discussed avoiding certain activities, icing regimen, home exercises.  Follow-up again in 4 to 6 weeks  MRI consistent with patient's symptoms.  We discussed with patient in great length about different treatment options and patient has elected to move forward with a epidural.  I believe patient will should probably have the most benefit from this.  Discussed posture and ergonomics, discussed that after the epidural we will see patient again 2 weeks later and see how patient responds.  Update 03/07/2020 Christine [name] is a 69 y.o. female coming in with complaint of neck pain. Patient states that she has had not pain and her dizziness has subsided. Epidural on 02/22/2020.  Patient states that she is feeling approximately 95% better.  Some very mild headache today but secondary to her root canal.  Feels like has made significant progress overall.  Patient states that multiple different problems including the headache, neck pain is significantly improved.    Past Medical History:  Diagnosis Date  . Acute bursitis of right shoulder 08/29/2018   Right shoulder injected in August 29, 2018  . Allergic rhinitis   . Closed displaced fracture of fifth metatarsal bone of left foot 03/16/2017  .  DEPRESSIVE DISORDER NOT ELSEWHERE CLASSIFIED 03/27/2010   Qualifier: Diagnosis of  By: [name] MD, [name]   [name], NP , [facility] [place]    . Diabetes ([place]) 04/02/2016  . Diabetes mellitus   . Essential hypertension 03/14/2008   Qualifier: Diagnosis of  By: [name] MD, [name]    . Fibromyalgia 08/12/2011   Diagnosed by [name] , Rheumatologist 2010   . GERD (gastroesophageal reflux disease) 12/25/2016  . Gluteal tendonitis of right buttock 10/19/2017   Injected in October 19, 2017 Injected October 07, 2018  . Greater trochanteric bursitis of left hip 06/17/2016  . Greater trochanteric bursitis of right hip 11/27/2015   Injected 11/27/2015   . History of recurrent UTIs    [name]  . HTN (hypertension)   . Hx of osteopenia 10/11/2012   [facility]EXA; ordered by [name] Lowest T score: -2.1 @ lumbar spine @ [facility] 11/01/12; decrease of 8.5% vs 05/2009. There is 9.2% risk over 10 yrs History fracture left elbow @ 4 Fractured left wrist , right elbow and nose post fall July/18/2013. Dr. [name] FH Osteoporosis [name] PMH of bisphosphonate therapy (generic Fosamax Rxed but not filled  due to polypharmacy )   . Hyperlipidemia   . Hyperlipidemia associated with type 2 diabetes mellitus ([place]) 01/18/2007   Qualifier: Diagnosis of  By: [name]  With DM LDL goal = < 100, ideally < 70. Mi in father @ 13; 2 brothers @ 50 & 28    . Lumbar radiculopathy 04/11/2008   Qualifier: Diagnosis of  By: [name] MD, Erick Blinks well to epidural 01/12/2017  repeat epidural given May 2018  . Migraine headache    quiescent  . Nonallopathic lesion of cervical region 07/19/2014  . Nonallopathic lesion of lumbosacral region 01/15/2016  . Nonallopathic lesion of sacral region 06/27/2018  . Nonallopathic lesion-rib cage 09/26/2014  . Palpitations 04/15/2011  . Rotator cuff impingement syndrome of left shoulder 05/11/2014  . Seasonal allergic rhinitis 03/21/2012  . Sinusitis, chronic 04/20/2016    . Sleep disorder    [name] May  . Subluxation of peroneal tendon of right foot 11/10/2017  . TIA (transient ischemic attack)   . TRANSIENT ISCHEMIC ATTACKS, HX OF 02/02/2008   Qualifier: Diagnosis of  By: [name]    . UNS ADVRS EFF UNS RX MEDICINAL&BIOLOGICAL SBSTNC 02/02/2008   Qualifier: Diagnosis of  By: [name] MD, [name]    . UNSPECIFIED MYALGIA AND MYOSITIS 02/02/2008   Qualifier: Diagnosis of  By: [name] MD, [name]    . UTI (urinary tract infection) 10/05/2014  . Viral upper respiratory tract infection with cough 12/31/2014  . Vitamin D deficiency 05/25/2008   Qualifier: Diagnosis of  By: [name] MD, [name]     Past Surgical History:  Procedure Laterality Date  . COLONOSCOPY     Dr Carlean [name]; hemorrhoids  . CORONARY ANGIOPLASTY  1997   for chest pain- negative   . POLYPECTOMY  2002   benign, hyperplastic polyp; rectal bleeding 2008  . TONSILLECTOMY    . TOTAL ABDOMINAL HYSTERECTOMY  1983   BSO for endometriosis  . WISDOM TOOTH EXTRACTION     Social History   Socioeconomic History  . Marital status: Married    Spouse name: Not on file  . Number of children: Not on file  . Years of education: Not on file  . Highest education level: Not on file  Occupational History  . 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 Smoking status: Never Smoker  . Smokeless tobacco: Never Used  Substance and Sexual Activity  . Alcohol use: [name]  . Drug use: [name]  . Sexual activity: Not on file  Other Topics Concern  . Not on file  Social History Narrative   Regular exercise- [name]    Social Determinants of Health   Financial Resource Strain:   . Difficulty of Paying Living Expenses:   Food Insecurity:   . Worried About Charity fundraiser in the Last Year:   . Arboriculturist in the Last Year:   Transportation Needs:   . Film/video editor (Medical):   [name] Lack of Transportation (Non-Medical):   Physical Activity:   . Days of  Exercise per Week:   . Minutes of Exercise per Session:   Stress:   . Feeling of Stress :   Social Connections:   . Frequency of Communication with Friends and Family:   . Frequency of Social Gatherings with Friends and Family:   . Attends Religious Services:   . Active Member of Clubs or Organizations:   . Attends Archivist Meetings:   [name] Marital Status:    Allergies  Allergen Reactions  . Vioxx [Rofecoxib] Other (See Comments)    Excess BP which caused TIA   . Prednisone Other (See Comments)    Mental status changes with high-dose oral agent. Tolerates epidural steroid injections. [name] associated rash or fever  . Colesevelam Other (See Comments)    Leg Pain   Family History  Problem Relation Age of Onset  . Colon cancer  Maternal Aunt   . Diabetes Paternal Uncle   . Heart attack Father 7  . COPD Mother   . Lung cancer Brother        smoker  . Mental illness Other        niece, committed suicide.  [name] Heart attack Other        brother X 2; @ 41 & 109  . Stroke Neg Hx     Current Outpatient Medications (Endocrine & Metabolic):  [name]  Insulin Glargine (BASAGLAR KWIKPEN) 100 UNIT/ML SOPN, Inject 0.3 mLs (30 Units total) into the skin every morning. .  pioglitazone (ACTOS) 15 MG tablet, Take 1 tablet (15 mg total) by mouth daily. .  pioglitazone (ACTOS) 30 MG tablet, TAKE 1 TABLET BY MOUTH EVERY DAY  Current Outpatient Medications (Cardiovascular):  .  atorvastatin (LIPITOR) 40 MG tablet, TAKE 1 TABLET BY MOUTH AT 6 PM .  losartan (COZAAR) 100 MG tablet, TAKE 1 TABLET BY MOUTH EVERY DAY .  metoprolol tartrate (LOPRESSOR) 100 MG tablet, Take one tablet by mouth 2 hours prior to your CT  Current Outpatient Medications (Respiratory):  .  promethazine (PHENERGAN) 12.5 MG tablet, Take 1 tablet (12.5 mg total) by mouth every 8 (eight) hours as needed for nausea or vomiting.  Current Outpatient Medications (Analgesics):  .  aspirin 81 MG tablet, Take 81 mg by mouth daily.    .  naproxen (NAPROSYN) 500 MG tablet, TAKE 1 TABLET (500 MG TOTAL) BY MOUTH 2 (TWO) TIMES DAILY WITH A MEAL. [name]  traMADol (ULTRAM) 50 MG tablet, TAKE 1 TABLET (50 MG TOTAL) BY MOUTH EVERY 6 (SIX) HOURS AS NEEDED.   Current Outpatient Medications (Other):  .  busPIRone (BUSPAR) 15 MG tablet, Take 15 mg by mouth 2 (two) times daily. .  DULoxetine (CYMBALTA) 60 MG capsule, Take 60 mg by mouth daily. .  Insulin Pen Needle (BD PEN NEEDLE NANO U/F) 32G X 4 MM MISC, USE ONCE A DAY AS DIRECTED .  Lancets (ONETOUCH ULTRASOFT) lancets, Check blood sugar once daily. Dx code: 250.00 .  LORazepam (ATIVAN) 0.5 MG tablet, Take 0.5 mg by mouth 2 (two) times daily as needed. For anxiety. [name]  omeprazole (PRILOSEC) 20 MG capsule, TAKE 1 CAPSULE BY MOUTH EVERY DAY .  ONETOUCH ULTRA test strip, USE 1 STRIP TWICE DAILY DX CODE E11.65 .  tiZANidine (ZANAFLEX) 2 MG tablet, TAKE 1 TABLET BY MOUTH EVERY 8 HOURS AS NEEDED FOR MUSCLE SPASMS .  traZODone (DESYREL) 50 MG tablet, TAKE 0.5-1 TABLETS (25-50 MG TOTAL) BY MOUTH AT BEDTIME AS NEEDED FOR SLEEP. .  Vitamin D, Ergocalciferol, (DRISDOL) 1.25 MG (50000 UNIT) CAPS capsule, TAKE 1 CAPSULE BY MOUTH ONE TIME PER WEEK   Reviewed prior external information including notes and imaging from  primary care provider As well as notes that were available from care everywhere and other healthcare systems.  Past medical history, social, surgical and family history all reviewed in electronic medical record.  [name] pertanent information unless stated regarding to the chief complaint.   Review of Systems:  [name] headache, visual changes, nausea, vomiting, diarrhea, constipation, dizziness, abdominal pain, skin rash, fevers, chills, night sweats, weight loss, swollen lymph nodes, body aches, joint swelling, chest pain, shortness of breath, mood changes. POSITIVE muscle aches very mild though compared to previous exam  Objective  Blood pressure 114/72, pulse 77, height 5\' 6"  (1.676 m),  weight 185 lb (83.9 kg), SpO2 96 %.   General: [name] apparent distress alert and oriented x3 mood  and affect normal, dressed appropriately.  HEENT: Pupils equal, extraocular movements intact  Respiratory: Patient's speak in full sentences and does not appear short of breath  Cardiovascular: [name] lower extremity edema, non tender, [name] erythema  Neuro: Cranial nerves II through XII are intact, neurovascularly intact in all extremities with 2+ DTRs and 2+ pulses.  Gait normal with good balance and coordination.  MSK: Neck exam still lacks last 5 degrees of extension.  Tender to palpation in the paraspinal musculature of the neck very mild.  Negative Spurling's.    Impression and Recommendations:     This case required medical decision making of moderate complexity. The above documentation has been reviewed and is accurate and complete Lyndal Pulley, DO       Note: This dictation was prepared with Dragon dictation along with smaller phrase technology. Any transcriptional errors that result from this process are unintentional.

## 2020-03-07 NOTE — Assessment & Plan Note (Signed)
Significant improvement since patient did have the epidural.  Patient is having significant amount of the increase in symptoms including the dizziness, headaches neck pain and overall sense of wellbeing is much better.  No change in medicine.  Follow-up again in 6 weeks to consider to restart osteopathic manipulation

## 2020-03-07 NOTE — Patient Instructions (Signed)
So happy you are better Increase activity as tolerated See me in 6 weeks

## 2020-03-12 ENCOUNTER — Ambulatory Visit: Payer: Medicare Other | Attending: Internal Medicine

## 2020-03-12 DIAGNOSIS — Z23 Encounter for immunization: Secondary | ICD-10-CM

## 2020-03-12 NOTE — Progress Notes (Signed)
   Covid-19 Vaccination Clinic  Name:  [name]    MRN: [id number] DOB: 01-09-1951  03/12/2020  [name] was observed post Covid-19 immunization for 15 minutes without incident. She was provided with Vaccine Information Sheet and instruction to access the V-Safe system.   [name] was instructed to call 911 with any severe reactions post vaccine: [name] Difficulty breathing  . Swelling of face and throat  . A fast heartbeat  . A bad rash all over body  . Dizziness and weakness   Immunizations Administered    Name Date Dose VIS Date Route   Pfizer COVID-19 Vaccine 03/12/2020 10:48 AM 0.3 mL 11/24/2019 Intramuscular   Manufacturer: [employer]   Lot: [id number]   [place]: [id number]

## 2020-03-15 DIAGNOSIS — F339 Major depressive disorder, recurrent, unspecified: Secondary | ICD-10-CM | POA: Diagnosis not present

## 2020-03-15 DIAGNOSIS — F410 Panic disorder [episodic paroxysmal anxiety] without agoraphobia: Secondary | ICD-10-CM | POA: Diagnosis not present

## 2020-03-19 ENCOUNTER — Other Ambulatory Visit: Payer: Self-pay | Admitting: Internal Medicine

## 2020-03-20 ENCOUNTER — Other Ambulatory Visit: Payer: Self-pay | Admitting: Internal Medicine

## 2020-03-25 NOTE — Addendum Note (Signed)
Addended by: Karle Barr on: 03/25/2020 10:18 AM   Modules accepted: Orders

## 2020-03-26 ENCOUNTER — Telehealth: Payer: Medicare Other

## 2020-03-26 NOTE — Chronic Care Management (AMB) (Deleted)
Chronic Care Management Pharmacy  Name: Christine [name]  MRN: [id number] DOB: [birth year]  Chief Complaint/ HPI  Christine [name],  69 y.o. , female presents for their Initial CCM visit with the clinical pharmacist via telephone due to COVID-19 Pandemic.  PCP : [name], MD  Their chronic conditions include: HTN, CAD, T2DM, HLD, GERD, depression, fibromyalgia  Office Visits:***  Consult Visit: 03/07/20 Dr Tamala Julian (sports med): epidural for cervical disc disorder, no med changes.  02/23/20 Dr Loanne Drilling (endo): increased Basaglar by 20 units d/t recent epidural and elevated BG.  01/25/20 Dr Tamala Julian (sports med): new dx cervical disc disorder.   01/05/20 Dr Loanne Drilling (endocrine): pt declines GLP1, SGLT2.  11/15/19 Dr Tamala Julian (cardiology): rec'd coronary CTA to r/o obstructive disease. Scan done 12/19/19 - result mild CAD  09/01/19 ED visit: palpitations, pt in NSR, not c/w ACS  Medications: Outpatient Encounter Medications as of 03/26/2020  Medication Sig  . aspirin 81 MG tablet Take 81 mg by mouth daily.    [name] atorvastatin (LIPITOR) 40 MG tablet TAKE 1 TABLET BY MOUTH AT 6 PM  . busPIRone (BUSPAR) 15 MG tablet Take 15 mg by mouth 2 (two) times daily.  . DULoxetine (CYMBALTA) 60 MG capsule Take 60 mg by mouth daily.  . Insulin Glargine (BASAGLAR KWIKPEN) 100 UNIT/ML SOPN Inject 0.3 mLs (30 Units total) into the skin every morning.  . Insulin Pen Needle (BD PEN NEEDLE NANO U/F) 32G X 4 MM MISC USE ONCE A DAY AS DIRECTED  . Lancets (ONETOUCH ULTRASOFT) lancets Check blood sugar once daily. Dx code: 250.00  . LORazepam (ATIVAN) 0.5 MG tablet Take 0.5 mg by mouth 2 (two) times daily as needed. For anxiety.  [name] losartan (COZAAR) 100 MG tablet TAKE 1 TABLET BY MOUTH EVERY DAY  . metoprolol tartrate (LOPRESSOR) 100 MG tablet Take one tablet by mouth 2 hours prior to your CT  . naproxen (NAPROSYN) 500 MG tablet TAKE 1 TABLET (500 MG TOTAL) BY MOUTH 2 (TWO) TIMES DAILY WITH A MEAL.   [name] omeprazole (PRILOSEC) 20 MG capsule TAKE 1 CAPSULE BY MOUTH EVERY DAY  . ONETOUCH ULTRA test strip USE 1 STRIP TWICE DAILY DX CODE E11.65  . pioglitazone (ACTOS) 15 MG tablet Take 1 tablet (15 mg total) by mouth daily.  . pioglitazone (ACTOS) 30 MG tablet TAKE 1 TABLET BY MOUTH EVERY DAY  . promethazine (PHENERGAN) 12.5 MG tablet Take 1 tablet (12.5 mg total) by mouth every 8 (eight) hours as needed for nausea or vomiting.  [name] tiZANidine (ZANAFLEX) 2 MG tablet TAKE 1 TABLET BY MOUTH EVERY 8 HOURS AS NEEDED FOR MUSCLE SPASMS  . traMADol (ULTRAM) 50 MG tablet TAKE 1 TABLET (50 MG TOTAL) BY MOUTH EVERY 6 (SIX) HOURS AS NEEDED.  [name] traZODone (DESYREL) 50 MG tablet TAKE 0.5-1 TABLETS (25-50 MG TOTAL) BY MOUTH AT BEDTIME AS NEEDED FOR SLEEP.  . Vitamin D, Ergocalciferol, (DRISDOL) 1.25 MG (50000 UNIT) CAPS capsule TAKE 1 CAPSULE BY MOUTH ONE TIME PER WEEK   No facility-administered encounter medications on file as of 03/26/2020.     Current Diagnosis/Assessment:  Goals Addressed   None     Hypertension   BP today is:  {CHL HP UPSTREAM Pharmacist BP ranges:[id number]}  Office blood pressures are  BP Readings from Last 3 Encounters:  03/07/20 114/72  02/22/20 (!) 170/74  02/16/20 130/82    Patient has failed these meds in the past: *** Patient is currently {CHL Controlled/[phone]} on the following medications: losartan 100  mg daily, metoprolol tartrate 100 mg   Patient checks BP at home {CHL HP BP Monitoring Frequency:[id number]}  Patient home BP readings are ranging: ***  We discussed {CHL HP Upstream Pharmacy discussion:[phone]}  Plan  Continue {CHL HP Upstream Pharmacy [id number]     Diabetes   Recent Relevant Labs: Lab Results  Component Value Date/Time   HGBA1C 7.2 (A) 01/05/2020 11:14 AM   HGBA1C 7.6 (A) 08/11/2019 11:19 AM   HGBA1C 6.6 (H) 05/24/2015 11:37 AM   HGBA1C 8.3 (H) 02/22/2015 11:24 AM   MICROALBUR 1.1 11/21/2014 10:40 AM    MICROALBUR 0.6 08/08/2014 12:07 PM     Checking BG: {CHL HP Blood Glucose Monitoring [phone]}  Recent FBG Readings: *** Recent pre-meal BG readings: *** Recent 2hr PP BG readings:  *** Recent HS BG readings: ***  Patient has failed these meds in past: *** Patient is currently {CHL Controlled/Uncontrolled:[phone]} on the following medications: Basaglar 30 units AM, pioglitazone 30 mg daily  Last diabetic Eye exam:  Lab Results  Component Value Date/Time   HMDIABEYEEXA No Retinopathy 05/18/2018 10:01 AM    Last diabetic Foot exam: No results found for: HMDIABFOOTEX   We discussed: {CHL HP Upstream Pharmacy discussion:[phone]}  Plan  Continue {CHL HP Upstream Pharmacy [phone]}  Hyperlipidemia/CAD   Lipid Panel     Component Value Date/Time   CHOL 163 02/08/2019 1121   TRIG 81.0 02/08/2019 1121   HDL 54.70 02/08/2019 1121   CHOLHDL 3 02/08/2019 1121   VLDL 16.2 02/08/2019 1121   LDLCALC 92 02/08/2019 1121     The 10-year ASCVD risk score (Goff DC Jr., et al., 2013) is: 17.6%   Values used to calculate the score:     Age: 52 years     Sex: Female     Is Non-Hispanic African American: No     Diabetic: Yes     Tobacco smoker: No     Systolic Blood Pressure: 99991111 mmHg     Is BP treated: Yes     HDL Cholesterol: 54.7 mg/dL     Total Cholesterol: 163 mg/dL   Patient has failed these meds in past: *** Patient is currently {CHL Controlled/Uncontrolled:[phone]} on the following medications: atorvastatin 40 mg 6pm, aspirin 81 mg   We discussed:  {CHL HP Upstream Pharmacy discussion:[phone]}  Plan  Continue {CHL HP Upstream Pharmacy [id number]   Depression/anxiety   Patient has failed these meds in past: Pristiq,  Patient is currently {CHL Controlled/Uncontrolled:[phone]} on the following medications: duloxetine 60 mg daily, buspirone 15 mg BID, lorazepam 0.5 mg BID prn, trazodone 25-50 mg HS prn  We discussed:  {CHL HP  Upstream Pharmacy discussion:[phone]}  Plan  Continue {CHL HP Upstream Pharmacy [phone]}   GERD   Patient has failed these meds in past: *** Patient is currently {CHL Controlled/Uncontrolled:[phone]} on the following medications: omeprazole 20 mg daily  We discussed:  {CHL HP Upstream Pharmacy discussion:[phone]}  Plan  Continue {CHL HP Upstream Pharmacy [phone]}   Pain***   Patient has failed these meds in past: *** Patient is currently {CHL Controlled/Uncontrolled:[phone]} on the following medications: tizanidine 2 mg TID prn, naproxen 500 mg BID prn  We discussed:  {CHL HP Upstream Pharmacy discussion:[phone]}  Plan  Continue {CHL HP Upstream Pharmacy [phone]}    Health Maintenance   Patient is currently {CHL Controlled/Uncontrolled:[phone]} on the following medications: Vitamin D 50,000 IU once weekly, promethazine 12.5 mg q8h prn  We discussed:  ***  Plan  Continue {CHL HP Upstream Pharmacy [id number]  Medication Management   Pt uses [facility] for all medications ***pill box Pt endorses ***% compliance  We discussed: ***  Plan  ***     Follow up: ***

## 2020-04-03 ENCOUNTER — Other Ambulatory Visit: Payer: Self-pay | Admitting: Family Medicine

## 2020-04-08 ENCOUNTER — Other Ambulatory Visit: Payer: Self-pay | Admitting: Internal Medicine

## 2020-04-10 ENCOUNTER — Ambulatory Visit (INDEPENDENT_AMBULATORY_CARE_PROVIDER_SITE_OTHER): Payer: Medicare Other | Admitting: Internal Medicine

## 2020-04-10 ENCOUNTER — Encounter: Payer: Self-pay | Admitting: Internal Medicine

## 2020-04-10 ENCOUNTER — Other Ambulatory Visit: Payer: Self-pay

## 2020-04-10 VITALS — BP 146/82 | HR 80 | Temp 98.1°F | Ht 66.0 in | Wt 187.2 lb

## 2020-04-10 DIAGNOSIS — I2510 Atherosclerotic heart disease of native coronary artery without angina pectoris: Secondary | ICD-10-CM

## 2020-04-10 DIAGNOSIS — R300 Dysuria: Secondary | ICD-10-CM | POA: Diagnosis not present

## 2020-04-10 LAB — POCT URINALYSIS DIPSTICK OB
Bilirubin, UA: NEGATIVE
Blood, UA: NEGATIVE
Glucose, UA: NEGATIVE
Ketones, UA: NEGATIVE
Nitrite, UA: NEGATIVE
Spec Grav, UA: 1.02 (ref 1.010–1.025)
Urobilinogen, UA: 0.2 E.U./dL
pH, UA: 6 (ref 5.0–8.0)

## 2020-04-10 MED ORDER — NITROFURANTOIN MONOHYD MACRO 100 MG PO CAPS: 100.0000 mg | ORAL_CAPSULE | Freq: Two times a day (BID) | ORAL | 0 refills | Status: DC

## 2020-04-10 NOTE — Patient Instructions (Signed)
We have sent in the macrobid to take 1 pill twice a day for 1 week. ° ° °

## 2020-04-10 NOTE — Progress Notes (Signed)
   Subjective:   Patient ID: Christine [name], female    DOB: 02-27-1951, 69 y.o.   MRN: [id number]  HPI The patient is a 69 YO female coming in for concerns about UTI. She has noticed cloudy urine for 1-2 weeks. Also with pain with having to urinate. Getting up 4+ times at night to urinate which is not usual for her. Denies fevers or chills. Denies nausea or vomiting. Denies back or flank pain. Overall not improving despite increased fluids.   Review of Systems  Constitutional: Negative.   Respiratory: Negative.   Cardiovascular: Negative.   Gastrointestinal: Positive for abdominal pain. Negative for abdominal distention, constipation, diarrhea, nausea and vomiting.  Genitourinary: Positive for dysuria, frequency and urgency.  Musculoskeletal: Negative.   Skin: Negative.     Objective:  Physical Exam Constitutional:      Appearance: She is well-developed.  HENT:     Head: Normocephalic and atraumatic.  Cardiovascular:     Rate and Rhythm: Normal rate and regular rhythm.  Pulmonary:     Effort: Pulmonary effort is normal. No respiratory distress.     Breath sounds: Normal breath sounds. No wheezing or rales.  Abdominal:     General: Bowel sounds are normal. There is no distension.     Palpations: Abdomen is soft.     Tenderness: There is no abdominal tenderness. There is no rebound.  Musculoskeletal:     Cervical back: Normal range of motion.  Skin:    General: Skin is warm and dry.  Neurological:     Mental Status: She is alert and oriented to person, place, and time.     Coordination: Coordination normal.     Vitals:   04/10/20 1024  BP: (!) 146/82  Pulse: 80  Temp: 98.1 F (36.7 C)  SpO2: 98%  Weight: 187 lb 3.2 oz (84.9 kg)  Height: 5\' 6"  (1.676 m)    This visit occurred during the SARS-CoV-2 public health emergency.  Safety protocols were in place, including screening questions prior to the visit, additional usage of staff PPE, and extensive cleaning of  exam room while observing appropriate contact time as indicated for disinfecting solutions.   Assessment & Plan:

## 2020-04-11 NOTE — Assessment & Plan Note (Signed)
POC U/A consistent with infection. Rx macrobid and adjust if no resolution of symptoms. No signs of pyelonephritis.

## 2020-04-12 ENCOUNTER — Ambulatory Visit (INDEPENDENT_AMBULATORY_CARE_PROVIDER_SITE_OTHER): Payer: Medicare Other | Admitting: Family Medicine

## 2020-04-12 ENCOUNTER — Other Ambulatory Visit: Payer: Self-pay

## 2020-04-12 ENCOUNTER — Encounter: Payer: Self-pay | Admitting: Family Medicine

## 2020-04-12 VITALS — BP 106/70 | HR 80 | Ht 66.0 in | Wt 187.0 lb

## 2020-04-12 DIAGNOSIS — I2510 Atherosclerotic heart disease of native coronary artery without angina pectoris: Secondary | ICD-10-CM | POA: Diagnosis not present

## 2020-04-12 DIAGNOSIS — M5010 Cervical disc disorder with radiculopathy, unspecified cervical region: Secondary | ICD-10-CM | POA: Diagnosis not present

## 2020-04-12 DIAGNOSIS — M999 Biomechanical lesion, unspecified: Secondary | ICD-10-CM | POA: Diagnosis not present

## 2020-04-12 MED ORDER — TRAZODONE HCL 50 MG PO TABS: ORAL_TABLET | ORAL | 0 refills | Status: DC

## 2020-04-12 NOTE — Progress Notes (Signed)
[facility] [address] [place] Phone: [phone] Subjective:   I [name] am serving as a Education administrator for Dr. Hulan Saas.  This visit occurred during the SARS-CoV-2 public health emergency.  Safety protocols were in place, including screening questions prior to the visit, additional usage of staff PPE, and extensive cleaning of exam room while observing appropriate contact time as indicated for disinfecting solutions.   I'm seeing this patient by the request  of:  [name], MD  CC: Neck pain follow-up  [id number]   03/07/2020 Significant improvement since patient did have the epidural.  Patient is having significant amount of the increase in symptoms including the dizziness, headaches neck pain and overall sense of wellbeing is much better.  No change in medicine.  Follow-up again in 6 weeks to consider to restart osteopathic manipulation  04/12/2020 Mane Stutts Mccullick is a 69 y.o. female coming in with complaint of neck pain. Patient states her neck is doing great. 1 week after her covid vaccine she had achy pains in her body.  Patient states that overall though she is doing significantly better.  Has not had any of the dizziness she was having either worse since she has had the epidural.  Feeling good overall.  Increasing activity     Past Medical History:  Diagnosis Date  . Acute bursitis of right shoulder 08/29/2018   Right shoulder injected in August 29, 2018  . Allergic rhinitis   . Closed displaced fracture of fifth metatarsal bone of left foot 03/16/2017  . DEPRESSIVE DISORDER NOT ELSEWHERE CLASSIFIED 03/27/2010   Qualifier: Diagnosis of  By: [name] MD, [name]   [name], NP , [facility] [place]    . Diabetes ([place]) 04/02/2016  . Diabetes mellitus   . Essential hypertension 03/14/2008   Qualifier: Diagnosis of  By: [name] MD, [name]    . Fibromyalgia 08/12/2011   Diagnosed by Dr  Marveen Reeks , Rheumatologist 2010   . GERD (gastroesophageal reflux disease) 12/25/2016  . Gluteal tendonitis of right buttock 10/19/2017   Injected in October 19, 2017 Injected October 07, 2018  . Greater trochanteric bursitis of left hip 06/17/2016  . Greater trochanteric bursitis of right hip 11/27/2015   Injected 11/27/2015   . History of recurrent UTIs    [name]  . HTN (hypertension)   . Hx of osteopenia 10/11/2012   [facility]EXA; ordered by [name] Lowest T score: -2.1 @ lumbar spine @ [facility] 11/01/12; decrease of 8.5% vs 05/2009. There is 9.2% risk over 10 yrs History fracture left elbow @ 4 Fractured left wrist , right elbow and nose post fall July/18/2013. Dr. [name] No FH Osteoporosis No PMH of bisphosphonate therapy (generic Fosamax Rxed but not filled  due to polypharmacy )   . Hyperlipidemia   . Hyperlipidemia associated with type 2 diabetes mellitus ([place]) 01/18/2007   Qualifier: Diagnosis of  By: [name]  With DM LDL goal = < 100, ideally < 70. Mi in father @ 88; 2 brothers @ 76 & 52    . Lumbar radiculopathy 04/11/2008   Qualifier: Diagnosis of  By: [name] MD, Erick Blinks well to epidural 01/12/2017  repeat epidural given May 2018  . Migraine headache    quiescent  . Nonallopathic lesion of cervical region 07/19/2014  . Nonallopathic lesion of lumbosacral region 01/15/2016  . Nonallopathic lesion of sacral region 06/27/2018  . Nonallopathic lesion-rib cage 09/26/2014  . Palpitations  04/15/2011  . Rotator cuff impingement syndrome of left shoulder 05/11/2014  . Seasonal allergic rhinitis 03/21/2012  . Sinusitis, chronic 04/20/2016  . Sleep disorder    [name] May  . Subluxation of peroneal tendon of right foot 11/10/2017  . TIA (transient ischemic attack)   . TRANSIENT ISCHEMIC ATTACKS, HX OF 02/02/2008   Qualifier: Diagnosis of  By: [name]    . UNS ADVRS EFF UNS RX MEDICINAL&BIOLOGICAL SBSTNC 02/02/2008   Qualifier: Diagnosis of  By: [name] MD, [name]    .  UNSPECIFIED MYALGIA AND MYOSITIS 02/02/2008   Qualifier: Diagnosis of  By: [name] MD, [name]    . UTI (urinary tract infection) 10/05/2014  . Viral upper respiratory tract infection with cough 12/31/2014  . Vitamin D deficiency 05/25/2008   Qualifier: Diagnosis of  By: [name] MD, [name]     Past Surgical History:  Procedure Laterality Date  . COLONOSCOPY     Dr Carlean [name]; hemorrhoids  . CORONARY ANGIOPLASTY  1997   for chest pain- negative   . POLYPECTOMY  2002   benign, hyperplastic polyp; rectal bleeding 2008  . TONSILLECTOMY    . TOTAL ABDOMINAL HYSTERECTOMY  1983   BSO for endometriosis  . WISDOM TOOTH EXTRACTION     Social History   Socioeconomic History  . Marital status: Married    Spouse name: Not on file  . Number of children: Not on file  . Years of education: Not on file  . Highest education level: Not on file  Occupational History  . 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 Smoking status: Never Smoker  . Smokeless tobacco: Never Used  Substance and Sexual Activity  . Alcohol use: No  . Drug use: No  . Sexual activity: Not on file  Other Topics Concern  . Not on file  Social History Narrative   Regular exercise- no    Social Determinants of Health   Financial Resource Strain:   . Difficulty of Paying Living Expenses:   Food Insecurity:   . Worried About Charity fundraiser in the Last Year:   . Arboriculturist in the Last Year:   Transportation Needs:   . Film/video editor (Medical):   [name] Lack of Transportation (Non-Medical):   Physical Activity:   . Days of Exercise per Week:   . Minutes of Exercise per Session:   Stress:   . Feeling of Stress :   Social Connections:   . Frequency of Communication with Friends and Family:   . Frequency of Social Gatherings with Friends and Family:   . Attends Religious Services:   . Active Member of Clubs or Organizations:   . Attends Archivist  Meetings:   [name] Marital Status:    Allergies  Allergen Reactions  . Vioxx [Rofecoxib] Other (See Comments)    Excess BP which caused TIA   . Prednisone Other (See Comments)    Mental status changes with high-dose oral agent. Tolerates epidural steroid injections. No associated rash or fever  . Colesevelam Other (See Comments)    Leg Pain   Family History  Problem Relation Age of Onset  . Colon cancer Maternal Aunt   . Diabetes Paternal Uncle   . Heart attack Father 27  . COPD Mother   . Lung cancer Brother        smoker  . Mental illness Other        niece, committed  suicide.  [name] Heart attack Other        brother X 2; @ 59 & 51  . Stroke Neg Hx     Current Outpatient Medications (Endocrine & Metabolic):  [name]  Insulin Glargine (BASAGLAR KWIKPEN) 100 UNIT/ML SOPN, Inject 0.3 mLs (30 Units total) into the skin every morning. .  pioglitazone (ACTOS) 15 MG tablet, Take 1 tablet (15 mg total) by mouth daily. .  pioglitazone (ACTOS) 30 MG tablet, TAKE 1 TABLET BY MOUTH EVERY DAY  Current Outpatient Medications (Cardiovascular):  .  atorvastatin (LIPITOR) 40 MG tablet, TAKE 1 TABLET BY MOUTH AT 6 PM .  losartan (COZAAR) 100 MG tablet, Take 1 tablet (100 mg total) by mouth daily. Overdue for Annual appt w/labs ust see provider for future refills .  metoprolol tartrate (LOPRESSOR) 100 MG tablet, Take one tablet by mouth 2 hours prior to your CT  Current Outpatient Medications (Respiratory):  .  promethazine (PHENERGAN) 12.5 MG tablet, Take 1 tablet (12.5 mg total) by mouth every 8 (eight) hours as needed for nausea or vomiting.  Current Outpatient Medications (Analgesics):  .  aspirin 81 MG tablet, Take 81 mg by mouth daily.   .  naproxen (NAPROSYN) 500 MG tablet, TAKE 1 TABLET (500 MG TOTAL) BY MOUTH 2 (TWO) TIMES DAILY WITH A MEAL. [name]  traMADol (ULTRAM) 50 MG tablet, TAKE 1 TABLET (50 MG TOTAL) BY MOUTH EVERY 6 (SIX) HOURS AS NEEDED.   Current Outpatient Medications (Other):  .   busPIRone (BUSPAR) 15 MG tablet, Take 15 mg by mouth 2 (two) times daily. .  DULoxetine (CYMBALTA) 60 MG capsule, Take 60 mg by mouth daily. .  Insulin Pen Needle (BD PEN NEEDLE NANO U/F) 32G X 4 MM MISC, USE ONCE A DAY AS DIRECTED .  Lancets (ONETOUCH ULTRASOFT) lancets, Check blood sugar once daily. Dx code: 250.00 .  LORazepam (ATIVAN) 0.5 MG tablet, Take 0.5 mg by mouth 2 (two) times daily as needed. For anxiety. .  nitrofurantoin, macrocrystal-monohydrate, (MACROBID) 100 MG capsule, Take 1 capsule (100 mg total) by mouth 2 (two) times daily. [name]  omeprazole (PRILOSEC) 20 MG capsule, Take 1 capsule (20 mg total) by mouth daily. Overdue for Annual appt w/labs ust see provider for future refills .  ONETOUCH ULTRA test strip, USE 1 STRIP TWICE DAILY DX CODE E11.65 .  tiZANidine (ZANAFLEX) 2 MG tablet, TAKE 1 TABLET BY MOUTH EVERY 8 HOURS AS NEEDED FOR MUSCLE SPASMS .  traZODone (DESYREL) 50 MG tablet, TAKE 0.5-1 TABLETS (25-50 MG TOTAL) BY MOUTH AT BEDTIME AS NEEDED FOR SLEEP. .  Vitamin D, Ergocalciferol, (DRISDOL) 1.25 MG (50000 UNIT) CAPS capsule, TAKE 1 CAPSULE BY MOUTH ONE TIME PER WEEK .  traZODone (DESYREL) 50 MG tablet, 1/2-1 tablet at bedtime   Reviewed prior external information including notes and imaging from  primary care provider As well as notes that were available from care everywhere and other healthcare systems.  Past medical history, social, surgical and family history all reviewed in electronic medical record.  No pertanent information unless stated regarding to the chief complaint.   Review of Systems:  No headache, visual changes, nausea, vomiting, diarrhea, constipation, dizziness, abdominal pain, skin rash, fevers, chills, night sweats, weight loss, swollen lymph nodes, body aches, joint swelling, chest pain, shortness of breath, mood changes. POSITIVE muscle aches  Objective  Blood pressure 106/70, pulse 80, height 5\' 6"  (1.676 m), weight 187 lb (84.8 kg), SpO2 96 %.    General: No apparent distress alert and  oriented x3 mood and affect normal, dressed appropriately.  HEENT: Pupils equal, extraocular movements intact  Respiratory: Patient's speak in full sentences and does not appear short of breath  Cardiovascular: No lower extremity edema, non tender, no erythema  Neuro: Cranial nerves II through XII are intact, neurovascularly intact in all extremities with 2+ DTRs and 2+ pulses.  Gait normal with good balance and coordination.  MSK:  Non tender with full range of motion and good stability and symmetric strength and tone of shoulders, elbows, wrist, hip, knee and ankles bilaterally.  Patient's neck exam does have some mild loss of lordosis.  Some tender to palpation in the parascapular region but significantly less than previous exam.  Improvement in range of motion to near full.  Negative Spurling's.  Osteopathic findings  C4 flexed rotated and side bent left C6 flexed rotated and side bent left T3 extended rotated and side bent right inhaled third rib L2 flexed rotated and side bent right Sacrum right on right    Impression and Recommendations:     This case required medical decision making of moderate complexity. The above documentation has been reviewed and is accurate and complete Lyndal Pulley, DO       Note: This dictation was prepared with Dragon dictation along with smaller phrase technology. Any transcriptional errors that result from this process are unintentional.

## 2020-04-12 NOTE — Patient Instructions (Addendum)
Good to see you Refilled trazodone I am happy with how good you feel!!! See me again in 6-7 weeks

## 2020-04-12 NOTE — Assessment & Plan Note (Signed)

## 2020-04-12 NOTE — Assessment & Plan Note (Addendum)
Degenerative disc disease noted.  Discussed which activities to doing which wants to avoid.  Discussed home exercise and icing regimen.  Seems to be improving at this moment.  No change in the medications.  Follow-up again in 4 to 8 weeks

## 2020-04-23 ENCOUNTER — Encounter: Payer: Self-pay | Admitting: Internal Medicine

## 2020-04-23 ENCOUNTER — Other Ambulatory Visit: Payer: Self-pay

## 2020-04-23 ENCOUNTER — Ambulatory Visit (INDEPENDENT_AMBULATORY_CARE_PROVIDER_SITE_OTHER): Payer: Medicare Other | Admitting: Internal Medicine

## 2020-04-23 ENCOUNTER — Other Ambulatory Visit: Payer: Self-pay | Admitting: Internal Medicine

## 2020-04-23 VITALS — BP 140/82 | HR 84 | Temp 98.3°F | Ht 66.0 in | Wt 187.4 lb

## 2020-04-23 DIAGNOSIS — E1169 Type 2 diabetes mellitus with other specified complication: Secondary | ICD-10-CM | POA: Diagnosis not present

## 2020-04-23 DIAGNOSIS — E785 Hyperlipidemia, unspecified: Secondary | ICD-10-CM | POA: Diagnosis not present

## 2020-04-23 DIAGNOSIS — R5383 Other fatigue: Secondary | ICD-10-CM

## 2020-04-23 DIAGNOSIS — E538 Deficiency of other specified B group vitamins: Secondary | ICD-10-CM | POA: Diagnosis not present

## 2020-04-23 DIAGNOSIS — Z0000 Encounter for general adult medical examination without abnormal findings: Secondary | ICD-10-CM | POA: Diagnosis not present

## 2020-04-23 DIAGNOSIS — I10 Essential (primary) hypertension: Secondary | ICD-10-CM

## 2020-04-23 DIAGNOSIS — R300 Dysuria: Secondary | ICD-10-CM | POA: Diagnosis not present

## 2020-04-23 DIAGNOSIS — E1151 Type 2 diabetes mellitus with diabetic peripheral angiopathy without gangrene: Secondary | ICD-10-CM | POA: Diagnosis not present

## 2020-04-23 DIAGNOSIS — I2510 Atherosclerotic heart disease of native coronary artery without angina pectoris: Secondary | ICD-10-CM

## 2020-04-23 DIAGNOSIS — Z794 Long term (current) use of insulin: Secondary | ICD-10-CM | POA: Diagnosis not present

## 2020-04-23 DIAGNOSIS — E559 Vitamin D deficiency, unspecified: Secondary | ICD-10-CM

## 2020-04-23 LAB — COMPREHENSIVE METABOLIC PANEL
ALT: 22 U/L (ref 0–35)
AST: 17 U/L (ref 0–37)
Albumin: 4.1 g/dL (ref 3.5–5.2)
Alkaline Phosphatase: 59 U/L (ref 39–117)
BUN: 18 mg/dL (ref 6–23)
CO2: 30 mEq/L (ref 19–32)
Calcium: 9.1 mg/dL (ref 8.4–10.5)
Chloride: 101 mEq/L (ref 96–112)
Creatinine, Ser: 0.67 mg/dL (ref 0.40–1.20)
GFR: 86.79 mL/min (ref >60.00)
Glucose, Bld: 118 mg/dL — ABNORMAL HIGH (ref 70–99)
Potassium: 3.8 mEq/L (ref 3.5–5.1)
Sodium: 139 mEq/L (ref 135–145)
Total Bilirubin: 0.4 mg/dL (ref 0.2–1.2)
Total Protein: 6.9 g/dL (ref 6.0–8.3)

## 2020-04-23 LAB — POC URINALSYSI DIPSTICK (AUTOMATED)
Blood, UA: NEGATIVE
Protein, UA: NEGATIVE
Spec Grav, UA: 1.015 (ref 1.010–1.025)
Urobilinogen, UA: 0.2 E.U./dL
pH, UA: 6 (ref 5.0–8.0)

## 2020-04-23 LAB — CBC
HCT: 39.1 % (ref 36.0–46.0)
Hemoglobin: 12.8 g/dL (ref 12.0–15.0)
MCHC: 32.8 g/dL (ref 30.0–36.0)
MCV: 92.3 fl (ref 78.0–100.0)
Platelets: 228 10*3/uL (ref 150.0–400.0)
RBC: 4.24 Mil/uL (ref 3.87–5.11)
RDW: 15 % (ref 11.5–15.5)
WBC: 5.3 10*3/uL (ref 4.0–10.5)

## 2020-04-23 LAB — POCT UA - MICROALBUMIN
Albumin/Creatinine Ratio, Urine, POC: <30
Creatinine, POC: 50 mg/dL
Microalbumin Ur, POC: 10 mg/L

## 2020-04-23 LAB — LIPID PANEL
Cholesterol: 194 mg/dL (ref 0–200)
HDL: 57.6 mg/dL (ref >39.00)
LDL Cholesterol: 118 mg/dL — ABNORMAL HIGH (ref 0–99)
NonHDL: 136.52
Total CHOL/HDL Ratio: 3
Triglycerides: 94 mg/dL (ref 0.0–149.0)
VLDL: 18.8 mg/dL (ref 0.0–40.0)

## 2020-04-23 LAB — VITAMIN B12: Vitamin B-12: 206 pg/mL — ABNORMAL LOW (ref 211–911)

## 2020-04-23 LAB — VITAMIN D 25 HYDROXY (VIT D DEFICIENCY, FRACTURES): VITD: 48.28 ng/mL (ref 30.00–100.00)

## 2020-04-23 NOTE — Assessment & Plan Note (Signed)
Checking lipid panel and adjust atorvastatin as needed.  

## 2020-04-23 NOTE — Progress Notes (Signed)
Subjective:   Patient ID: Christine [name], female    DOB: 1951-02-05, 69 y.o.   MRN: [id number]  HPI Here for medicare wellness, no new complaints. Please see A/P for status and treatment of chronic medical problems.   HPI #2: Here for follow up of diabetes (seeing endo for management of her basaglar and actos, denies new numbness or tingling) and cholesterol (taking atorvastatin, denies side effects, denies headaches or stroke or chest pain symptoms) and blood pressure (taking losartan and metoprolol, denies headaches or chest pains)  Diet: DM since diabetic Physical activity: sedentary Depression/mood screen: negative Hearing: intact to whispered voice Visual acuity: grossly normal, performs annual eye exam  ADLs: capable Fall risk: none Home safety: good Cognitive evaluation: intact to orientation, naming, recall and repetition EOL planning: adv directives discussed    Office Visit from 04/23/2020 in [place] at [facility] Total Score  0       I have personally reviewed and have noted 1. The patient's medical and social history - reviewed today no changes 2. Their use of alcohol, tobacco or illicit drugs 3. Their current medications and supplements 4. The patient's functional ability including ADL's, fall risks, home safety risks and hearing or visual impairment. 5. Diet and physical activities 6. Evidence for depression or mood disorders 7. Care team reviewed and updated  Patient Care Team: [name], MD as PCP - General (Internal Medicine) Belva Crome, MD as PCP - Cardiology (Cardiology) Pieter Partridge, DO as Consulting Physician (Neurology) Charlton Haws, [facility] as Pharmacist (Pharmacist) Past Medical History:  Diagnosis Date  . Acute bursitis of right shoulder 08/29/2018   Right shoulder injected in August 29, 2018  . Allergic rhinitis   . Closed displaced fracture of fifth metatarsal bone of left foot 03/16/2017  .  DEPRESSIVE DISORDER NOT ELSEWHERE CLASSIFIED 03/27/2010   Qualifier: Diagnosis of  By: [name] MD, [name]   [name], NP , [facility] [place]    . Diabetes ([place]) 04/02/2016  . Diabetes mellitus   . Essential hypertension 03/14/2008   Qualifier: Diagnosis of  By: [name] MD, [name]    . Fibromyalgia 08/12/2011   Diagnosed by [name] , Rheumatologist 2010   . GERD (gastroesophageal reflux disease) 12/25/2016  . Gluteal tendonitis of right buttock 10/19/2017   Injected in October 19, 2017 Injected October 07, 2018  . Greater trochanteric bursitis of left hip 06/17/2016  . Greater trochanteric bursitis of right hip 11/27/2015   Injected 11/27/2015   . History of recurrent UTIs    [name]  . HTN (hypertension)   . Hx of osteopenia 10/11/2012   [facility]; ordered by [name] Lowest T score: -2.1 @ lumbar spine @ [facility] 11/01/12; decrease of 8.5% vs 05/2009. There is 9.2% risk over 10 yrs History fracture left elbow @ 6 Fractured left wrist , right elbow and nose post fall July/18/2013. Dr. [name] No FH Osteoporosis No PMH of bisphosphonate therapy (generic Fosamax Rxed but not filled  due to polypharmacy )   . Hyperlipidemia   . Hyperlipidemia associated with type 2 diabetes mellitus ([place]) 01/18/2007   Qualifier: Diagnosis of  By: [name]  With DM LDL goal = < 100, ideally < 70. Mi in father @ 48; 2 brothers @ 75 & 42    . Lumbar radiculopathy 04/11/2008   Qualifier: Diagnosis of  By: [name] MD, Erick Blinks well to epidural 01/12/2017  repeat epidural given  May 2018  . Migraine headache    quiescent  . Nonallopathic lesion of cervical region 07/19/2014  . Nonallopathic lesion of lumbosacral region 01/15/2016  . Nonallopathic lesion of sacral region 06/27/2018  . Nonallopathic lesion-rib cage 09/26/2014  . Palpitations 04/15/2011  . Rotator cuff impingement syndrome of left shoulder 05/11/2014  . Seasonal allergic rhinitis 03/21/2012  . Sinusitis, chronic 04/20/2016   . Sleep disorder    [name] May  . Subluxation of peroneal tendon of right foot 11/10/2017  . TIA (transient ischemic attack)   . TRANSIENT ISCHEMIC ATTACKS, HX OF 02/02/2008   Qualifier: Diagnosis of  By: [name]    . UNS ADVRS EFF UNS RX MEDICINAL&BIOLOGICAL SBSTNC 02/02/2008   Qualifier: Diagnosis of  By: [name] MD, [name]    . UNSPECIFIED MYALGIA AND MYOSITIS 02/02/2008   Qualifier: Diagnosis of  By: [name] MD, [name]    . UTI (urinary tract infection) 10/05/2014  . Viral upper respiratory tract infection with cough 12/31/2014  . Vitamin D deficiency 05/25/2008   Qualifier: Diagnosis of  By: [name] MD, [name]     Past Surgical History:  Procedure Laterality Date  . COLONOSCOPY     Dr Carlean [name]; hemorrhoids  . CORONARY ANGIOPLASTY  1997   for chest pain- negative   . POLYPECTOMY  2002   benign, hyperplastic polyp; rectal bleeding 2008  . TONSILLECTOMY    . TOTAL ABDOMINAL HYSTERECTOMY  1983   BSO for endometriosis  . WISDOM TOOTH EXTRACTION     Family History  Problem Relation Age of Onset  . Colon cancer Maternal Aunt   . Diabetes Paternal Uncle   . Heart attack Father 41  . COPD Mother   . Lung cancer Brother        smoker  . Mental illness Other        niece, committed suicide.  [name] Heart attack Other        brother X 2; @ 39 & 91  . Stroke Neg Hx    Review of Systems  Constitutional: Negative.   HENT: Negative.   Eyes: Negative.   Respiratory: Negative for cough, chest tightness and shortness of breath.   Cardiovascular: Negative for chest pain, palpitations and leg swelling.  Gastrointestinal: Negative for abdominal distention, abdominal pain, constipation, diarrhea, nausea and vomiting.  Musculoskeletal: Negative.   Skin: Negative.   Neurological: Negative.   Psychiatric/Behavioral: Negative.     Objective:  Physical Exam Constitutional:      Appearance: She is well-developed.  HENT:     Head: Normocephalic and atraumatic.  Cardiovascular:      Rate and Rhythm: Normal rate and regular rhythm.  Pulmonary:     Effort: Pulmonary effort is normal. No respiratory distress.     Breath sounds: Normal breath sounds. No wheezing or rales.  Abdominal:     General: Bowel sounds are normal. There is no distension.     Palpations: Abdomen is soft.     Tenderness: There is no abdominal tenderness. There is no rebound.  Musculoskeletal:     Cervical back: Normal range of motion.  Skin:    General: Skin is warm and dry.     Comments: Foot exam done  Neurological:     Mental Status: She is alert and oriented to person, place, and time.     Coordination: Coordination normal.     Vitals:   04/23/20 1309  BP: 140/82  Pulse: 84  Temp: 98.3 F (36.8 C)  SpO2: 97%  Weight:  187 lb 6.4 oz (85 kg)  Height: 5\' 6"  (1.676 m)    This visit occurred during the SARS-CoV-2 public health emergency.  Safety protocols were in place, including screening questions prior to the visit, additional usage of staff PPE, and extensive cleaning of exam room while observing appropriate contact time as indicated for disinfecting solutions.   Assessment & Plan:

## 2020-04-23 NOTE — Assessment & Plan Note (Signed)
Urine checked and clear.

## 2020-04-23 NOTE — Assessment & Plan Note (Signed)
BP at goal on losartan, metoprolol. Checking CMP and adjust as needed.

## 2020-04-23 NOTE — Assessment & Plan Note (Signed)
Checking vitamin D level.  

## 2020-04-23 NOTE — Addendum Note (Signed)
Addended by: Otilio Miu on: 04/23/2020 03:49 PM   Modules accepted: Orders

## 2020-04-23 NOTE — Assessment & Plan Note (Addendum)
Flu shot due next season. Covid-19 complete. Pneumonia complete. Shingrix counseled. Tetanus due, declines. Colonoscopy due, counseled to schedule. Mammogram due 2022, pap smear aged out and dexa due 2023. Counseled about sun safety and mole surveillance. Counseled about the dangers of distracted driving. Given 10 year screening recommendations.

## 2020-04-23 NOTE — Patient Instructions (Signed)
You are due for a colonoscopy so you can call to get that scheduled.   Health Maintenance, Female Adopting a healthy lifestyle and getting preventive care are important in promoting health and wellness. Ask your health care provider about:  The right schedule for you to have regular tests and exams.  Things you can do on your own to prevent diseases and keep yourself healthy. What should I know about diet, weight, and exercise? Eat a healthy diet   Eat a diet that includes plenty of vegetables, fruits, low-fat dairy products, and lean protein.  Do not eat a lot of foods that are high in solid fats, added sugars, or sodium. Maintain a healthy weight Body mass index (BMI) is used to identify weight problems. It estimates body fat based on height and weight. Your health care provider can help determine your BMI and help you achieve or maintain a healthy weight. Get regular exercise Get regular exercise. This is one of the most important things you can do for your health. Most adults should:  Exercise for at least 150 minutes each week. The exercise should increase your heart rate and make you sweat (moderate-intensity exercise).  Do strengthening exercises at least twice a week. This is in addition to the moderate-intensity exercise.  Spend less time sitting. Even light physical activity can be beneficial. Watch cholesterol and blood lipids Have your blood tested for lipids and cholesterol at 70 years of age, then have this test every 5 years. Have your cholesterol levels checked more often if:  Your lipid or cholesterol levels are high.  You are older than 71 years of age.  You are at high risk for heart disease. What should I know about cancer screening? Depending on your health history and family history, you may need to have cancer screening at various ages. This may include screening for:  Breast cancer.  Cervical cancer.  Colorectal cancer.  Skin cancer.  Lung  cancer. What should I know about heart disease, diabetes, and high blood pressure? Blood pressure and heart disease  High blood pressure causes heart disease and increases the risk of stroke. This is more likely to develop in people who have high blood pressure readings, are of African descent, or are overweight.  Have your blood pressure checked: ? Every 3-5 years if you are 22-3 years of age. ? Every year if you are 94 years old or older. Diabetes Have regular diabetes screenings. This checks your fasting blood sugar level. Have the screening done:  Once every three years after age 71 if you are at a normal weight and have a low risk for diabetes.  More often and at a younger age if you are overweight or have a high risk for diabetes. What should I know about preventing infection? Hepatitis B If you have a higher risk for hepatitis B, you should be screened for this virus. Talk with your health care provider to find out if you are at risk for hepatitis B infection. Hepatitis C Testing is recommended for:  Everyone born from [birth year] through 1967.  Anyone with known risk factors for hepatitis C. Sexually transmitted infections (STIs)  Get screened for STIs, including gonorrhea and chlamydia, if: ? You are sexually active and are younger than 71 years of age. ? You are older than 71 years of age and your health care provider tells you that you are at risk for this type of infection. ? Your sexual activity has changed since you were last screened,  and you are at increased risk for chlamydia or gonorrhea. Ask your health care provider if you are at risk.  Ask your health care provider about whether you are at high risk for HIV. Your health care provider may recommend a prescription medicine to help prevent HIV infection. If you choose to take medicine to prevent HIV, you should first get tested for HIV. You should then be tested every 3 months for as long as you are taking the  medicine. Pregnancy  If you are about to stop having your period (premenopausal) and you may become pregnant, seek counseling before you get pregnant.  Take 400 to 800 micrograms (mcg) of folic acid every day if you become pregnant.  Ask for birth control (contraception) if you want to prevent pregnancy. Osteoporosis and menopause Osteoporosis is a disease in which the bones lose minerals and strength with aging. This can result in bone fractures. If you are 43 years old or older, or if you are at risk for osteoporosis and fractures, ask your health care provider if you should:  Be screened for bone loss.  Take a calcium or vitamin D supplement to lower your risk of fractures.  Be given hormone replacement therapy (HRT) to treat symptoms of menopause. Follow these instructions at home: Lifestyle  Do not use any products that contain nicotine or tobacco, such as cigarettes, e-cigarettes, and chewing tobacco. If you need help quitting, ask your health care provider.  Do not use street drugs.  Do not share needles.  Ask your health care provider for help if you need support or information about quitting drugs. Alcohol use  Do not drink alcohol if: ? Your health care provider tells you not to drink. ? You are pregnant, may be pregnant, or are planning to become pregnant.  If you drink alcohol: ? Limit how much you use to 0-1 drink a day. ? Limit intake if you are breastfeeding.  Be aware of how much alcohol is in your drink. In the [place] one drink equals one 12 oz bottle of beer (355 mL), one 5 oz glass of wine (148 mL), or one 1 oz glass of hard liquor (44 mL). General instructions  Schedule regular health, dental, and eye exams.  Stay current with your vaccines.  Tell your health care provider if: ? You often feel depressed. ? You have ever been abused or do not feel safe at home. Summary  Adopting a healthy lifestyle and getting preventive care are important in  promoting health and wellness.  Follow your health care provider's instructions about healthy diet, exercising, and getting tested or screened for diseases.  Follow your health care provider's instructions on monitoring your cholesterol and blood pressure. This information is not intended to replace advice given to you by your health care provider. Make sure you discuss any questions you have with your health care provider. Document Revised: 11/23/2018 Document Reviewed: 11/23/2018 Elsevier Patient Education  2020 [employer].

## 2020-04-23 NOTE — Assessment & Plan Note (Signed)
Foot exam done, sees endo for management. On ARB and statin.

## 2020-04-24 ENCOUNTER — Encounter: Payer: Self-pay | Admitting: Internal Medicine

## 2020-04-25 MED ORDER — ATORVASTATIN CALCIUM 80 MG PO TABS: 80.0000 mg | ORAL_TABLET | Freq: Every day | ORAL | 3 refills | Status: DC

## 2020-04-25 NOTE — Telephone Encounter (Signed)
Have sent in higher dose lipitor, please schedule for monthly B12 shots

## 2020-04-26 ENCOUNTER — Other Ambulatory Visit: Payer: Self-pay

## 2020-04-26 ENCOUNTER — Ambulatory Visit (INDEPENDENT_AMBULATORY_CARE_PROVIDER_SITE_OTHER): Payer: Medicare Other

## 2020-04-26 DIAGNOSIS — E538 Deficiency of other specified B group vitamins: Secondary | ICD-10-CM

## 2020-04-26 MED ORDER — CYANOCOBALAMIN 1000 MCG/ML IJ SOLN
1000.0000 ug | Freq: Once | INTRAMUSCULAR | Status: AC
  Administered 2020-04-26: 1000 ug via INTRAMUSCULAR

## 2020-04-26 NOTE — Progress Notes (Signed)
Medical screening examination/treatment/procedure(s) were performed by non-physician practitioner and as supervising physician I was immediately available for consultation/collaboration. I agree with above. Aanya Haynes, MD   

## 2020-05-02 ENCOUNTER — Other Ambulatory Visit: Payer: Self-pay | Admitting: Internal Medicine

## 2020-05-22 ENCOUNTER — Other Ambulatory Visit: Payer: Self-pay | Admitting: Family Medicine

## 2020-05-27 ENCOUNTER — Ambulatory Visit (INDEPENDENT_AMBULATORY_CARE_PROVIDER_SITE_OTHER): Payer: Medicare Other

## 2020-05-27 ENCOUNTER — Other Ambulatory Visit: Payer: Self-pay

## 2020-05-27 DIAGNOSIS — E538 Deficiency of other specified B group vitamins: Secondary | ICD-10-CM

## 2020-05-27 MED ORDER — CYANOCOBALAMIN 1000 MCG/ML IJ SOLN
1000.0000 ug | Freq: Once | INTRAMUSCULAR | Status: AC
  Administered 2020-05-27: 1000 ug via INTRAMUSCULAR

## 2020-05-27 NOTE — Chronic Care Management (AMB) (Deleted)
Chronic Care Management Pharmacy  Name: Christine [name]  MRN: [id number] DOB: Apr 18, 1951  Chief Complaint/ HPI  Christine [name],  69 y.o. , female presents for their Initial CCM visit with the clinical pharmacist via telephone due to COVID-19 Pandemic.  PCP : [name], MD  Their chronic conditions include: Hypertension, Hyperlipidemia, Diabetes, Coronary Artery Disease, GERD, Depression and Back/Neck pain, fibromyalgia, osteopenia   Office Visits: 04/23/20 Dr Sharlet Salina OV: cholesterol higher, increased atorvastatin to 80 mg. Also started B12 injections.  04/10/20 Dr Sharlet Salina OV: tx'd UTI w/ Macrobid.  Consult Visit: 04/12/20 Dr Tamala Julian (sports med): neck pain tx'd with OMT, refilled trazodone.  01/05/20 Dr Loanne Drilling (endocrine): DM Dx 2006, insulin since 2014. Declines MDI. Pioglitazone mainly for NASH. Declines additional DM meds to reduce insulin dose.  Allergies  Allergen Reactions  . Vioxx [Rofecoxib] Other (See Comments)    Excess BP which caused TIA   . Prednisone Other (See Comments)    Mental status changes with high-dose oral agent. Tolerates epidural steroid injections. No associated rash or fever  . Colesevelam Other (See Comments)    Leg Pain    Medications: Outpatient Encounter Medications as of 05/28/2020  Medication Sig  . aspirin 81 MG tablet Take 81 mg by mouth daily.    [name] atorvastatin (LIPITOR) 80 MG tablet Take 1 tablet (80 mg total) by mouth daily.  . busPIRone (BUSPAR) 15 MG tablet Take 15 mg by mouth 2 (two) times daily.  . DULoxetine (CYMBALTA) 60 MG capsule Take 60 mg by mouth daily.  . Insulin Glargine (BASAGLAR KWIKPEN) 100 UNIT/ML SOPN Inject 0.3 mLs (30 Units total) into the skin every morning.  . Insulin Pen Needle (BD PEN NEEDLE NANO U/F) 32G X 4 MM MISC USE ONCE A DAY AS DIRECTED  . Lancets (ONETOUCH ULTRASOFT) lancets Check blood sugar once daily. Dx code: 250.00  . LORazepam (ATIVAN) 0.5 MG tablet Take 0.5 mg by mouth 2  (two) times daily as needed. For anxiety.  [name] losartan (COZAAR) 100 MG tablet Take 1 tablet (100 mg total) by mouth daily.  . metoprolol tartrate (LOPRESSOR) 100 MG tablet Take one tablet by mouth 2 hours prior to your CT  . naproxen (NAPROSYN) 500 MG tablet TAKE 1 TABLET (500 MG TOTAL) BY MOUTH 2 (TWO) TIMES DAILY WITH A MEAL.  [name] omeprazole (PRILOSEC) 20 MG capsule Take 1 capsule (20 mg total) by mouth daily.  Glory Rosebush ULTRA test strip USE 1 STRIP TWICE DAILY DX CODE E11.65  . pioglitazone (ACTOS) 15 MG tablet Take 1 tablet (15 mg total) by mouth daily.  . pioglitazone (ACTOS) 30 MG tablet TAKE 1 TABLET BY MOUTH EVERY DAY  . promethazine (PHENERGAN) 12.5 MG tablet Take 1 tablet (12.5 mg total) by mouth every 8 (eight) hours as needed for nausea or vomiting.  [name] tiZANidine (ZANAFLEX) 2 MG tablet TAKE 1 TABLET BY MOUTH EVERY 8 HOURS AS NEEDED FOR MUSCLE SPASM  . traMADol (ULTRAM) 50 MG tablet TAKE 1 TABLET (50 MG TOTAL) BY MOUTH EVERY 6 (SIX) HOURS AS NEEDED.  [name] traZODone (DESYREL) 50 MG tablet TAKE 0.5-1 TABLETS (25-50 MG TOTAL) BY MOUTH AT BEDTIME AS NEEDED FOR SLEEP.  . Vitamin D, Ergocalciferol, (DRISDOL) 1.25 MG (50000 UNIT) CAPS capsule TAKE 1 CAPSULE BY MOUTH ONE TIME PER WEEK   No facility-administered encounter medications on file as of 05/28/2020.     Current Diagnosis/Assessment:    Goals Addressed   None     Hypertension   BP  goal is:  <130/80  Office blood pressures are  BP Readings from Last 3 Encounters:  04/23/20 140/82  04/12/20 106/70  04/10/20 (!) 146/82   Kidney Function Lab Results  Component Value Date/Time   CREATININE 0.67 04/23/2020 01:31 PM   CREATININE 0.71 11/29/2019 12:07 PM   CREATININE 0.62 03/13/2011 04:36 PM   GFR 86.79 04/23/2020 01:31 PM   GFRNONAA 87 11/29/2019 12:07 PM   GFRAA 100 11/29/2019 12:07 PM   Patient checks BP at home {CHL HP BP Monitoring [phone]} Patient home BP readings are ranging: ***  Patient has failed  these meds in the past: *** Patient is currently {CHL Controlled/Uncontrolled:[phone]} on the following medications:  . Losartan 100 mg daily  We discussed {CHL HP Upstream Pharmacy [phone]}  Plan  Continue {CHL HP Upstream Pharmacy Plans:[phone]}     Hyperlipidemia/Coronary atherosclerosis   LDL goal < 100  Lipid Panel     Component Value Date/Time   CHOL 194 04/23/2020 1331   TRIG 94.0 04/23/2020 1331   HDL 57.60 04/23/2020 1331   LDLCALC 118 (H) 04/23/2020 1331     The 10-year ASCVD risk score Mikey Bussing DC Jr., et al., 2013) is: 26.4%   Values used to calculate the score:     Age: 20 years     Sex: Female     Is Non-Hispanic African American: No     Diabetic: Yes     Tobacco smoker: No     Systolic Blood Pressure: 578 mmHg     Is BP treated: Yes     HDL Cholesterol: 57.6 mg/dL     Total Cholesterol: 194 mg/dL   Patient has failed these meds in past: *** Patient is currently {CHL Controlled/Uncontrolled:[phone]} on the following medications:  . Atorvastatin 80 mg daily . Aspirin 81 mg daily  We discussed:  {CHL HP Upstream Pharmacy [phone]}  Plan  Continue {CHL HP Upstream Pharmacy Plans:[phone]}   Diabetes   A1c goal < 8%  Recent Relevant Labs: Lab Results  Component Value Date/Time   HGBA1C 7.2 (A) 01/05/2020 11:14 AM   HGBA1C 7.6 (A) 08/11/2019 11:19 AM   HGBA1C 6.6 (H) 05/24/2015 11:37 AM   HGBA1C 8.3 (H) 02/22/2015 11:24 AM   GFR 86.79 04/23/2020 01:31 PM   GFR 87.09 02/08/2019 11:21 AM   MICROALBUR 10 04/23/2020 03:47 PM   MICROALBUR 1.1 11/21/2014 10:40 AM   MICROALBUR 0.6 08/08/2014 12:07 PM    Last diabetic Eye exam:  Lab Results  Component Value Date/Time   HMDIABEYEEXA No Retinopathy 05/18/2018 10:01 AM    Last diabetic Foot exam: No results found for: HMDIABFOOTEX   Checking BG: {CHL HP Blood Glucose Monitoring Frequency:[phone]}  Recent FBG Readings: *** Recent pre-meal BG readings:  *** Recent 2hr PP BG readings:  *** Recent HS BG readings: ***  Patient has failed these meds in past: Janumet, metformin, repaglinide Patient is currently {CHL Controlled/Uncontrolled:[phone]} on the following medications: . Basaglar 30 units AM . Pioglitazone 30 mg daily  We discussed: {CHL HP Upstream Pharmacy [phone]}  Plan  Continue {CHL HP Upstream Pharmacy Plans:[phone]}  GERD   Patient has failed these meds in past: *** Patient is currently {CHL Controlled/Uncontrolled:[phone]} on the following medications:  . Omeprazole 20 mg daily  We discussed:  ***  Plan  Continue {CHL HP Upstream Pharmacy IONGE:[id number]}   Depression/Fibromyalgia   Depression screen [facility] 2/9 04/23/2020 02/08/2019 05/21/2016  Decreased Interest 0 0 0  Down, Depressed, Hopeless 0 0 0  PHQ - 2 Score 0  0 0  Some recent data might be hidden    Patient has failed these meds in past: *** Patient is currently {CHL Controlled/Uncontrolled:[phone]} on the following medications:  . Duloxetine 60 mg daily . Buspirone 15 mg BID . Lorazepam 0.5 mg BID prn . Trazodone 50 mg - 1/2-1 tablet HS prn  We discussed:  ***  Plan  Continue {CHL HP Upstream Pharmacy [phone]}  Back/neck pain   Patient has failed these meds in past: *** Patient is currently {CHL Controlled/Uncontrolled:[phone]} on the following medications:  . Tizanidine 2 mg q8h PRN . Naproxen 500 mg BID prn . Tramadol 50 mg q6h PRN  We discussed:  ***  Plan  Continue {CHL HP Upstream Pharmacy [phone]}  Osteopenia    Last DEXA Scan: 01/08/2015  T-Score femoral neck: -2.0  T-Score total hip: -1.6  T-Score lumbar spine: -1.5  T-Score forearm radius: n/a  10-year probability of major osteoporotic fracture: 17.3%  10-year probability of hip fracture: 2.7%  VITD  Date Value Ref Range Status  04/23/2020 48.28 30.00 - 100.00 ng/mL Final     Patient is not a candidate for  pharmacologic treatment  Patient has failed these meds in past: n/a Patient is currently controlled on the following medications:   Vitamin D 50,000 IU once weekly  We discussed:  {Osteoporosis Counseling:23892}  Plan  Continue {CHL HP Upstream Pharmacy [phone]}   B12 Deficiency   Lab Results  Component Value Date/Time   VITAMINB12 206 (L) 04/23/2020 01:31 PM   Patient is currently {CHL Controlled/Uncontrolled:[phone]} on the following medications:  . Cyanacobalamin 1000 mcg injection  We discussed:  ***  Plan  Continue {CHL HP Upstream Pharmacy [phone]}  Medication Management   Pt uses [facility] for all medications Uses pill box? {Yes or If no, why not?:20788} Pt endorses ***% compliance  We discussed: ***  Plan  {[facility] Pharmacy URKY:70623}    Follow up: *** month phone visit  ***

## 2020-05-27 NOTE — Progress Notes (Signed)
Medical treatment/procedure(s) were performed by non-physician practitioner and as supervising physician I was immediately available for consultation/collaboration. I agree with above. Devaris Quirk A Heywood Tokunaga, MD  

## 2020-05-27 NOTE — Progress Notes (Signed)
Pt here for scheduled cyanocobalamin 1069mcg injection given left deltoid.  Pt tolerated well.  Next injection scheduled for July 14.

## 2020-05-28 ENCOUNTER — Telehealth: Payer: Medicare Other

## 2020-05-30 ENCOUNTER — Other Ambulatory Visit: Payer: Self-pay | Admitting: Family Medicine

## 2020-05-31 ENCOUNTER — Ambulatory Visit (INDEPENDENT_AMBULATORY_CARE_PROVIDER_SITE_OTHER): Payer: Medicare Other | Admitting: Internal Medicine

## 2020-05-31 ENCOUNTER — Other Ambulatory Visit: Payer: Self-pay

## 2020-05-31 ENCOUNTER — Encounter: Payer: Self-pay | Admitting: Internal Medicine

## 2020-05-31 VITALS — BP 140/80 | HR 88 | Temp 98.7°F | Ht 66.0 in | Wt 190.0 lb

## 2020-05-31 DIAGNOSIS — E1169 Type 2 diabetes mellitus with other specified complication: Secondary | ICD-10-CM | POA: Diagnosis not present

## 2020-05-31 DIAGNOSIS — M791 Myalgia, unspecified site: Secondary | ICD-10-CM | POA: Diagnosis not present

## 2020-05-31 DIAGNOSIS — I2510 Atherosclerotic heart disease of native coronary artery without angina pectoris: Secondary | ICD-10-CM | POA: Diagnosis not present

## 2020-05-31 DIAGNOSIS — T466X5A Adverse effect of antihyperlipidemic and antiarteriosclerotic drugs, initial encounter: Secondary | ICD-10-CM | POA: Diagnosis not present

## 2020-05-31 DIAGNOSIS — R8290 Unspecified abnormal findings in urine: Secondary | ICD-10-CM

## 2020-05-31 DIAGNOSIS — E785 Hyperlipidemia, unspecified: Secondary | ICD-10-CM

## 2020-05-31 LAB — POCT URINALYSIS DIPSTICK
Bilirubin, UA: NEGATIVE
Glucose, UA: NEGATIVE
Ketones, UA: NEGATIVE
Nitrite, UA: NEGATIVE
Protein, UA: POSITIVE — AB
Spec Grav, UA: 1.01 (ref 1.010–1.025)
Urobilinogen, UA: NEGATIVE E.U./dL — AB
pH, UA: 6 (ref 5.0–8.0)

## 2020-05-31 LAB — COMPREHENSIVE METABOLIC PANEL
ALT: 27 U/L (ref 0–35)
AST: 18 U/L (ref 0–37)
Albumin: 4.2 g/dL (ref 3.5–5.2)
Alkaline Phosphatase: 67 U/L (ref 39–117)
BUN: 13 mg/dL (ref 6–23)
CO2: 32 mEq/L (ref 19–32)
Calcium: 9.4 mg/dL (ref 8.4–10.5)
Chloride: 102 mEq/L (ref 96–112)
Creatinine, Ser: 0.64 mg/dL (ref 0.40–1.20)
GFR: 91.48 mL/min (ref >60.00)
Glucose, Bld: 102 mg/dL — ABNORMAL HIGH (ref 70–99)
Potassium: 3.6 mEq/L (ref 3.5–5.1)
Sodium: 136 mEq/L (ref 135–145)
Total Bilirubin: 0.4 mg/dL (ref 0.2–1.2)
Total Protein: 6.9 g/dL (ref 6.0–8.3)

## 2020-05-31 LAB — CK: Total CK: 55 U/L (ref 7–177)

## 2020-05-31 MED ORDER — NITROFURANTOIN MONOHYD MACRO 100 MG PO CAPS: 100.0000 mg | ORAL_CAPSULE | Freq: Two times a day (BID) | ORAL | 0 refills | Status: DC

## 2020-05-31 NOTE — Assessment & Plan Note (Signed)
U/A done in office and consistent with infection. Rx macrobid and call back if symptoms not improving.

## 2020-05-31 NOTE — Patient Instructions (Signed)
We will check the labs today and call you back about the results. In the meantime go back down to 40 mg daily of the lipitor (atorvastatin).  We have sent in macrobid for the urinary tract infection.

## 2020-05-31 NOTE — Assessment & Plan Note (Signed)
We discussed goal LDL <70 and given her new muscle pains will reduce dosing lipitor back to 40 mg daily today. Work on diet and exercise and come back 3-6 months for recheck lipid panel.

## 2020-05-31 NOTE — Assessment & Plan Note (Signed)
Checking CK and CMP today. Reduce dosing atorvastatin to 40 mg daily as symptoms started after increase to 80 mg daily. She will work on diet and exercise and come back in 3-6 months for recheck.

## 2020-05-31 NOTE — Progress Notes (Signed)
   Subjective:   Patient ID: Christine [name], female    DOB: Jun 28, 1951, 69 y.o.   MRN: [id number]  HPI The patient is a 69 YO female coming in for new muscle pains (started about 3 weeks ago after increase in dosing from atorvastatin 40 mg daily to 80 mg daily after lipid panel was too high, mostly in the legs and sometimes arms, denies much pain today but still dull, at worst 7/10) and urinary pain (started 1-2 days ago, with cloudy urine yesterday, drinking extra fluids, past history of UTI, today is some better but still wants to be tested, no fevers or chills, denies nausea or vomiting, some abdominal pain 1-2 days ago with onset of symptoms but not present today).   Review of Systems  Constitutional: Negative.   HENT: Negative.   Eyes: Negative.   Respiratory: Negative.  Negative for cough, chest tightness and shortness of breath.   Cardiovascular: Negative.  Negative for chest pain, palpitations and leg swelling.  Gastrointestinal: Negative for abdominal distention, abdominal pain, constipation, diarrhea, nausea and vomiting.  Genitourinary: Positive for dysuria, frequency and urgency.  Musculoskeletal: Positive for myalgias.  Skin: Negative.   Neurological: Negative.   Psychiatric/Behavioral: Negative.     Objective:  Physical Exam Constitutional:      Appearance: She is well-developed.  HENT:     Head: Normocephalic and atraumatic.  Cardiovascular:     Rate and Rhythm: Normal rate and regular rhythm.  Pulmonary:     Effort: Pulmonary effort is normal. No respiratory distress.     Breath sounds: Normal breath sounds. No wheezing or rales.  Abdominal:     General: Bowel sounds are normal. There is no distension.     Palpations: Abdomen is soft.     Tenderness: There is no abdominal tenderness. There is no rebound.  Musculoskeletal:        General: Tenderness present.     Cervical back: Normal range of motion.  Skin:    General: Skin is warm and dry.    Neurological:     Mental Status: She is alert and oriented to person, place, and time.     Coordination: Coordination normal.     Vitals:   05/31/20 1352  BP: 140/80  Pulse: 88  Temp: 98.7 F (37.1 C)  TempSrc: Oral  SpO2: 97%  Weight: 190 lb (86.2 kg)  Height: 5\' 6"  (1.676 m)    This visit occurred during the SARS-CoV-2 public health emergency.  Safety protocols were in place, including screening questions prior to the visit, additional usage of staff PPE, and extensive cleaning of exam room while observing appropriate contact time as indicated for disinfecting solutions.   Assessment & Plan:

## 2020-06-04 ENCOUNTER — Ambulatory Visit (INDEPENDENT_AMBULATORY_CARE_PROVIDER_SITE_OTHER): Payer: Medicare Other | Admitting: Endocrinology

## 2020-06-04 ENCOUNTER — Other Ambulatory Visit: Payer: Self-pay

## 2020-06-04 ENCOUNTER — Encounter: Payer: Self-pay | Admitting: Endocrinology

## 2020-06-04 VITALS — BP 126/70 | HR 105 | Ht 66.0 in | Wt 188.0 lb

## 2020-06-04 DIAGNOSIS — E1151 Type 2 diabetes mellitus with diabetic peripheral angiopathy without gangrene: Secondary | ICD-10-CM

## 2020-06-04 DIAGNOSIS — Z794 Long term (current) use of insulin: Secondary | ICD-10-CM | POA: Diagnosis not present

## 2020-06-04 DIAGNOSIS — I2510 Atherosclerotic heart disease of native coronary artery without angina pectoris: Secondary | ICD-10-CM | POA: Diagnosis not present

## 2020-06-04 LAB — POCT GLYCOSYLATED HEMOGLOBIN (HGB A1C): Hemoglobin A1C: 7 % — AB (ref 4.0–5.6)

## 2020-06-04 MED ORDER — BASAGLAR KWIKPEN 100 UNIT/ML ~~LOC~~ SOPN: 28.0000 [IU] | PEN_INJECTOR | SUBCUTANEOUS | 3 refills | Status: DC

## 2020-06-04 NOTE — Patient Instructions (Signed)
check your blood sugar twice a day.  vary the time of day when you check, between before the 3 meals, and at bedtime.  also check if you have symptoms of your blood sugar being too high or too low.  please keep a record of the readings and bring it to your next appointment here.  You can write it on any piece of paper.  please call us sooner if your blood sugar goes below 70, or if you have a lot of readings over 200.   Please reduce the basaglar to 28 units each morning.   Please come back for a follow-up appointment in 3-5 months.

## 2020-06-04 NOTE — Progress Notes (Signed)
Subjective:    Patient ID: Christine [name], female    DOB: Dec 21, 1950, 69 y.o.   MRN: [id number]  HPI Pt returns for f/u of diabetes mellitus: DM type: Insulin-requiring type 2 Dx'ed: 5093 Complications: TIA Therapy: insulin since 2014, and pioglitizone.   GDM: never. DKA: never.  Severe hypoglycemia: never.  Pancreatitis: never.   Other: She declines multiple daily injections; pioglitizone is for NASH; a trial to convert back to oral rx in 2017 was unsuccessful; she declines additional DM rx.   Interval history: she brings a record of her fasting cbg's which I have reviewed today.  cbg's vary from 61-125.  It was highest after a steroid injection into the right shoulder.  She checks fasting only.  She takes 30 units qam.  Past Medical History:  Diagnosis Date  . Acute bursitis of right shoulder 08/29/2018   Right shoulder injected in August 29, 2018  . Allergic rhinitis   . Closed displaced fracture of fifth metatarsal bone of left foot 03/16/2017  . DEPRESSIVE DISORDER NOT ELSEWHERE CLASSIFIED 03/27/2010   Qualifier: Diagnosis of  By: [name] MD, [name]   [name], NP , [facility] [place]    . Diabetes ([place]) 04/02/2016  . Diabetes mellitus   . Essential hypertension 03/14/2008   Qualifier: Diagnosis of  By: [name] MD, [name]    . Fibromyalgia 08/12/2011   Diagnosed by [name] , Rheumatologist 2010   . GERD (gastroesophageal reflux disease) 12/25/2016  . Gluteal tendonitis of right buttock 10/19/2017   Injected in October 19, 2017 Injected October 07, 2018  . Greater trochanteric bursitis of left hip 06/17/2016  . Greater trochanteric bursitis of right hip 11/27/2015   Injected 11/27/2015   . History of recurrent UTIs    [name]  . HTN (hypertension)   . Hx of osteopenia 10/11/2012   [facility]A; ordered by [name] Lowest T score: -2.1 @ lumbar spine @ [facility] 11/01/12; decrease of 8.5% vs 05/2009. There is 9.2% risk over 10 yrs History fracture  left elbow @ 4 Fractured left wrist , right elbow and nose post fall July/18/2013. Dr. [name] No FH Osteoporosis No PMH of bisphosphonate therapy (generic Fosamax Rxed but not filled  due to polypharmacy )   . Hyperlipidemia   . Hyperlipidemia associated with type 2 diabetes mellitus ([place]) 01/18/2007   Qualifier: Diagnosis of  By: [name]  With DM LDL goal = < 100, ideally < 70. Mi in father @ 59; 2 brothers @ 40 & 48    . Lumbar radiculopathy 04/11/2008   Qualifier: Diagnosis of  By: [name] MD, Erick Blinks well to epidural 01/12/2017  repeat epidural given May 2018  . Migraine headache    quiescent  . Nonallopathic lesion of cervical region 07/19/2014  . Nonallopathic lesion of lumbosacral region 01/15/2016  . Nonallopathic lesion of sacral region 06/27/2018  . Nonallopathic lesion-rib cage 09/26/2014  . Palpitations 04/15/2011  . Rotator cuff impingement syndrome of left shoulder 05/11/2014  . Seasonal allergic rhinitis 03/21/2012  . Sinusitis, chronic 04/20/2016  . Sleep disorder    [name] May  . Subluxation of peroneal tendon of right foot 11/10/2017  . TIA (transient ischemic attack)   . TRANSIENT ISCHEMIC ATTACKS, HX OF 02/02/2008   Qualifier: Diagnosis of  By: [name]    . UNS ADVRS EFF UNS RX MEDICINAL&BIOLOGICAL SBSTNC 02/02/2008   Qualifier: Diagnosis of  By: [name] MD, [name]    . UNSPECIFIED MYALGIA  AND MYOSITIS 02/02/2008   Qualifier: Diagnosis of  By: [name] MD, [name]    . UTI (urinary tract infection) 10/05/2014  . Viral upper respiratory tract infection with cough 12/31/2014  . Vitamin D deficiency 05/25/2008   Qualifier: Diagnosis of  By: [name] MD, [name]      Past Surgical History:  Procedure Laterality Date  . COLONOSCOPY     Dr Carlean [name]; hemorrhoids  . CORONARY ANGIOPLASTY  1997   for chest pain- negative   . POLYPECTOMY  2002   benign, hyperplastic polyp; rectal bleeding 2008  . TONSILLECTOMY    . TOTAL ABDOMINAL HYSTERECTOMY  1983   BSO for  endometriosis  . WISDOM TOOTH EXTRACTION      Social History   Socioeconomic History  . Marital status: Married    Spouse name: Not on file  . Number of children: Not on file  . Years of education: Not on file  . Highest education level: Not on file  Occupational History  . 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 Smoking status: Never Smoker  . Smokeless tobacco: Never Used  Vaping Use  . Vaping Use: Never used  Substance and Sexual Activity  . Alcohol use: No  . Drug use: No  . Sexual activity: Not on file  Other Topics Concern  . Not on file  Social History Narrative   Regular exercise- no    Social Determinants of Health   Financial Resource Strain:   . Difficulty of Paying Living Expenses:   Food Insecurity:   . Worried About Charity fundraiser in the Last Year:   . Arboriculturist in the Last Year:   Transportation Needs:   . Film/video editor (Medical):   [name] Lack of Transportation (Non-Medical):   Physical Activity:   . Days of Exercise per Week:   . Minutes of Exercise per Session:   Stress:   . Feeling of Stress :   Social Connections:   . Frequency of Communication with Friends and Family:   . Frequency of Social Gatherings with Friends and Family:   . Attends Religious Services:   . Active Member of Clubs or Organizations:   . Attends Archivist Meetings:   [name] Marital Status:   Intimate Partner Violence:   . Fear of Current or Ex-Partner:   . Emotionally Abused:   [name] Physically Abused:   . Sexually Abused:     Current Outpatient Medications on File Prior to Visit  Medication Sig Dispense Refill  . aspirin 81 MG tablet Take 81 mg by mouth daily.      [name] atorvastatin (LIPITOR) 80 MG tablet Take 1 tablet (80 mg total) by mouth daily. 90 tablet 3  . busPIRone (BUSPAR) 15 MG tablet Take 15 mg by mouth 2 (two) times daily.    . DULoxetine (CYMBALTA) 60 MG capsule Take 60 mg by mouth daily.      . Insulin Pen Needle (BD PEN NEEDLE NANO U/F) 32G X 4 MM MISC USE ONCE A DAY AS DIRECTED 100 each 3  . Lancets (ONETOUCH ULTRASOFT) lancets Check blood sugar once daily. Dx code: 250.00 100 each 12  . LORazepam (ATIVAN) 0.5 MG tablet Take 0.5 mg by mouth 2 (two) times daily as needed. For anxiety.    [name] losartan (COZAAR) 100 MG tablet Take 1 tablet (100 mg total) by mouth daily. 90 tablet 3  . metoprolol tartrate (LOPRESSOR) 100 MG  tablet Take one tablet by mouth 2 hours prior to your CT 1 tablet 0  . naproxen (NAPROSYN) 500 MG tablet TAKE 1 TABLET (500 MG TOTAL) BY MOUTH 2 (TWO) TIMES DAILY WITH A MEAL. 180 tablet 1  . nitrofurantoin, macrocrystal-monohydrate, (MACROBID) 100 MG capsule Take 1 capsule (100 mg total) by mouth 2 (two) times daily. 14 capsule 0  . omeprazole (PRILOSEC) 20 MG capsule Take 1 capsule (20 mg total) by mouth daily. 90 capsule 3  . ONETOUCH ULTRA test strip USE 1 STRIP TWICE DAILY DX CODE E11.65 100 strip 3  . pioglitazone (ACTOS) 15 MG tablet Take 1 tablet (15 mg total) by mouth daily. 90 tablet 3  . promethazine (PHENERGAN) 12.5 MG tablet Take 1 tablet (12.5 mg total) by mouth every 8 (eight) hours as needed for nausea or vomiting. 20 tablet 8  . tiZANidine (ZANAFLEX) 2 MG tablet TAKE 1 TABLET BY MOUTH EVERY 8 HOURS AS NEEDED FOR MUSCLE SPASM 60 tablet 0  . traMADol (ULTRAM) 50 MG tablet TAKE 1 TABLET (50 MG TOTAL) BY MOUTH EVERY 6 (SIX) HOURS AS NEEDED. 20 tablet 0  . traZODone (DESYREL) 50 MG tablet TAKE 0.5-1 TABLETS (25-50 MG TOTAL) BY MOUTH AT BEDTIME AS NEEDED FOR SLEEP. 90 tablet 0  . Vitamin D, Ergocalciferol, (DRISDOL) 1.25 MG (50000 UNIT) CAPS capsule TAKE 1 CAPSULE BY MOUTH ONE TIME PER WEEK 12 capsule 0   No current facility-administered medications on file prior to visit.    Allergies  Allergen Reactions  . Vioxx [Rofecoxib] Other (See Comments)    Excess BP which caused TIA   . Prednisone Other (See Comments)    Mental status changes with high-dose  oral agent. Tolerates epidural steroid injections. No associated rash or fever  . Colesevelam Other (See Comments)    Leg Pain    Family History  Problem Relation Age of Onset  . Colon cancer Maternal Aunt   . Diabetes Paternal Uncle   . Heart attack Father 56  . COPD Mother   . Lung cancer Brother        smoker  . Mental illness Other        niece, committed suicide.  [name] Heart attack Other        brother X 2; @ 40 & 41  . Stroke Neg Hx     BP 126/70   Pulse (!) 105   Ht 5\' 6"  (1.676 m)   Wt 188 lb (85.3 kg)   LMP  (LMP Unknown)   SpO2 98%   BMI 30.34 kg/m    Review of Systems Denies LOC    Objective:   Physical Exam VITAL SIGNS:  See vs page GENERAL: no distress Pulses: dorsalis pedis intact bilat.   MSK: no deformity of the feet CV: no leg edema Skin:  no ulcer on the feet.  normal color and temp on the feet. Neuro: sensation is intact to touch on the feet  Lab Results  Component Value Date   HGBA1C 7.0 (A) 06/04/2020   Lab Results  Component Value Date   ALT 27 05/31/2020   AST 18 05/31/2020   ALKPHOS 67 05/31/2020   BILITOT 0.4 05/31/2020    Lab Results  Component Value Date   CREATININE 0.64 05/31/2020   BUN 13 05/31/2020   NA 136 05/31/2020   K 3.6 05/31/2020   CL 102 05/31/2020   CO2 32 05/31/2020       Assessment & Plan:  Insulin-requiring type 2 DM, with  TIA. Hypoglycemia, due to insulin: this limits aggressiveness of glycemic control   Patient Instructions  check your blood sugar twice a day.  vary the time of day when you check, between before the 3 meals, and at bedtime.  also check if you have symptoms of your blood sugar being too high or too low.  please keep a record of the readings and bring it to your next appointment here.  You can write it on any piece of paper.  please call us sooner if your blood sugar goes below 70, or if you have a lot of readings over 200.   Please reduce the basaglar to 28 units each morning.   Please  come back for a follow-up appointment in 3-5 months.

## 2020-06-05 ENCOUNTER — Ambulatory Visit (INDEPENDENT_AMBULATORY_CARE_PROVIDER_SITE_OTHER): Payer: Medicare Other | Admitting: Family Medicine

## 2020-06-05 ENCOUNTER — Encounter: Payer: Self-pay | Admitting: Family Medicine

## 2020-06-05 VITALS — BP 122/80 | HR 89 | Ht 66.0 in | Wt 190.0 lb

## 2020-06-05 DIAGNOSIS — M999 Biomechanical lesion, unspecified: Secondary | ICD-10-CM

## 2020-06-05 DIAGNOSIS — I2510 Atherosclerotic heart disease of native coronary artery without angina pectoris: Secondary | ICD-10-CM | POA: Diagnosis not present

## 2020-06-05 DIAGNOSIS — M7061 Trochanteric bursitis, right hip: Secondary | ICD-10-CM

## 2020-06-05 NOTE — Patient Instructions (Addendum)
Good to see you See me again in 6 weeks 

## 2020-06-05 NOTE — Progress Notes (Signed)
[employer] [place] Phone: [phone] Subjective:   [name], am serving as a scribe for Dr. Hulan Saas. This visit occurred during the SARS-CoV-2 public health emergency.  Safety protocols were in place, including screening questions prior to the visit, additional usage of staff PPE, and extensive cleaning of exam room while observing appropriate contact time as indicated for disinfecting solutions.   I'm seeing this patient by the request  of:  [name], MD  CC: Low back pain and hip pain  [id number]  Smt Stutts Alexie is a 69 y.o. female coming in with complaint of back and neck pain. OMT 04/12/2020. Patient states that pain in right hip and lower right side of her back has increased. Having body aches and PCP returned patient to lower dose of cholesterol medication.           Reviewed prior external information including notes and imaging from previsou exam, outside providers and external EMR if available.   As well as notes that were available from care everywhere and other healthcare systems.  Past medical history, social, surgical and family history all reviewed in electronic medical record.  No pertanent information unless stated regarding to the chief complaint.   Past Medical History:  Diagnosis Date  . Acute bursitis of right shoulder 08/29/2018   Right shoulder injected in August 29, 2018  . Allergic rhinitis   . Closed displaced fracture of fifth metatarsal bone of left foot 03/16/2017  . DEPRESSIVE DISORDER NOT ELSEWHERE CLASSIFIED 03/27/2010   Qualifier: Diagnosis of  By: [name] MD, [name]   [name], NP , [facility] [place] [place]    . Diabetes ([place]) 04/02/2016  . Diabetes mellitus   . Essential hypertension 03/14/2008   Qualifier: Diagnosis of  By: [name] MD, [name]    . Fibromyalgia 08/12/2011   Diagnosed by [name] , Rheumatologist 2010   . GERD  (gastroesophageal reflux disease) 12/25/2016  . Gluteal tendonitis of right buttock 10/19/2017   Injected in October 19, 2017 Injected October 07, 2018  . Greater trochanteric bursitis of left hip 06/17/2016  . Greater trochanteric bursitis of right hip 11/27/2015   Injected 11/27/2015   . History of recurrent UTIs    [name]  . HTN (hypertension)   . Hx of osteopenia 10/11/2012   [facility]; ordered by [name] Lowest T score: -2.1 @ lumbar spine @ [facility] 11/01/12; decrease of 8.5% vs 05/2009. There is 9.2% risk over 10 yrs History fracture left elbow @ 4 Fractured left wrist , right elbow and nose post fall July/18/2013. Dr. [name] No FH Osteoporosis No PMH of bisphosphonate therapy (generic Fosamax Rxed but not filled  due to polypharmacy )   . Hyperlipidemia   . Hyperlipidemia associated with type 2 diabetes mellitus ([place]) 01/18/2007   Qualifier: Diagnosis of  By: [name]  With DM LDL goal = < 100, ideally < 70. Mi in father @ 26; 2 brothers @ 101 & 6    . Lumbar radiculopathy 04/11/2008   Qualifier: Diagnosis of  By: [name] MD, Erick Blinks well to epidural 01/12/2017  repeat epidural given May 2018  . Migraine headache    quiescent  . Nonallopathic lesion of cervical region 07/19/2014  . Nonallopathic lesion of lumbosacral region 01/15/2016  . Nonallopathic lesion of sacral region 06/27/2018  . Nonallopathic lesion-rib cage 09/26/2014  . Palpitations 04/15/2011  . Rotator cuff impingement syndrome of  left shoulder 05/11/2014  . Seasonal allergic rhinitis 03/21/2012  . Sinusitis, chronic 04/20/2016  . Sleep disorder    [name] May  . Subluxation of peroneal tendon of right foot 11/10/2017  . TIA (transient ischemic attack)   . TRANSIENT ISCHEMIC ATTACKS, HX OF 02/02/2008   Qualifier: Diagnosis of  By: [name]    . UNS ADVRS EFF UNS RX MEDICINAL&BIOLOGICAL SBSTNC 02/02/2008   Qualifier: Diagnosis of  By: [name] MD, [name]    . UNSPECIFIED MYALGIA AND MYOSITIS 02/02/2008     Qualifier: Diagnosis of  By: [name] MD, [name]    . UTI (urinary tract infection) 10/05/2014  . Viral upper respiratory tract infection with cough 12/31/2014  . Vitamin D deficiency 05/25/2008   Qualifier: Diagnosis of  By: [name] MD, [name]      Allergies  Allergen Reactions  . Vioxx [Rofecoxib] Other (See Comments)    Excess BP which caused TIA   . Prednisone Other (See Comments)    Mental status changes with high-dose oral agent. Tolerates epidural steroid injections. No associated rash or fever  . Colesevelam Other (See Comments)    Leg Pain     Review of Systems:  No , visual changes, nausea, vomiting, diarrhea, constipation, dizziness, abdominal pain, skin rash, fevers, chills, night sweats, weight loss, swollen lymph nodes, joint swelling, chest pain, shortness of breath, mood changes. POSITIVE muscle aches, body aches, headache  Objective  Blood pressure 122/80, pulse 89, height 5\' 6"  (1.676 m), weight 190 lb (86.2 kg), SpO2 97 %.   General: No apparent distress alert and oriented x3 mood and affect normal, dressed appropriately.  HEENT: Pupils equal, extraocular movements intact  Respiratory: Patient's speak in full sentences and does not appear short of breath  Cardiovascular: No lower extremity edema, non tender, no erythema  Neuro: Cranial nerves II through XII are intact, neurovascularly intact in all extremities with 2+ DTRs and 2+ pulses.  Gait normal with good balance and coordination.  Back -magnesium does have some tenderness more in the parascapular region.  Increasing discomfort and pain though also over the right greater trochanteric area.  Positive FABER test noted, negative straight leg test.  Osteopathic findings  C2 flexed rotated and side bent right C6 flexed rotated and side bent left T3 extended rotated and side bent right inhaled rib T9 extended rotated and side bent left L2 flexed rotated and side bent right Sacrum right on right   After  verbal consent patient was prepped in sterile fashion with alcohol swabs. Ethyl chloride used patient was injected with a 22-gauge 3 inch needle into the RIGHT lateral hip in the greater trochanteric area under ultrasound guidance. Picture was taken. Patient had 4 cc of 0.5% Marcaine and 1 cc of Kenalog 40 mg/dL injected. Patient tolerated the procedure well and no blood loss. Pain completely resolved after injection stating proper placement. Post injection instructions given.    Assessment and Plan:  Greater trochanteric bursitis, right Injection today.  Tolerated the procedure well.  I am expecting patient to do very well.  Last injection was multiple years ago.  Patient going to continue the other medications.  Follow-up again in 4 to 8 weeks.  Patient will watch blood sugars a little closer.    Nonallopathic problems  Decision today to treat with OMT was based on Physical Exam  After verbal consent patient was treated with HVLA, ME, FPR techniques in cervical, rib, thoracic, lumbar, and sacral  areas  Patient tolerated the procedure well with  improvement in symptoms  Patient given exercises, stretches and lifestyle modifications  See medications in patient instructions if given  Patient will follow up in 4-8 weeks      The above documentation has been reviewed and is accurate and complete Lyndal Pulley, DO       Note: This dictation was prepared with Dragon dictation along with smaller phrase technology. Any transcriptional errors that result from this process are unintentional.

## 2020-06-05 NOTE — Assessment & Plan Note (Signed)
Injection today.  Tolerated the procedure well.  I am expecting patient to do very well.  Last injection was multiple years ago.  Patient going to continue the other medications.  Follow-up again in 4 to 8 weeks.  Patient will watch blood sugars a little closer.

## 2020-06-07 ENCOUNTER — Other Ambulatory Visit: Payer: Self-pay

## 2020-06-07 ENCOUNTER — Encounter (HOSPITAL_BASED_OUTPATIENT_CLINIC_OR_DEPARTMENT_OTHER): Payer: Self-pay

## 2020-06-07 ENCOUNTER — Encounter: Payer: Self-pay | Admitting: Family Medicine

## 2020-06-07 DIAGNOSIS — Z79899 Other long term (current) drug therapy: Secondary | ICD-10-CM | POA: Insufficient documentation

## 2020-06-07 DIAGNOSIS — Z794 Long term (current) use of insulin: Secondary | ICD-10-CM | POA: Insufficient documentation

## 2020-06-07 DIAGNOSIS — I10 Essential (primary) hypertension: Secondary | ICD-10-CM | POA: Diagnosis not present

## 2020-06-07 DIAGNOSIS — L509 Urticaria, unspecified: Secondary | ICD-10-CM | POA: Diagnosis not present

## 2020-06-07 DIAGNOSIS — E119 Type 2 diabetes mellitus without complications: Secondary | ICD-10-CM | POA: Diagnosis not present

## 2020-06-07 DIAGNOSIS — Z7982 Long term (current) use of aspirin: Secondary | ICD-10-CM | POA: Insufficient documentation

## 2020-06-07 DIAGNOSIS — Z791 Long term (current) use of non-steroidal anti-inflammatories (NSAID): Secondary | ICD-10-CM | POA: Diagnosis not present

## 2020-06-07 MED ORDER — HYDROXYZINE HCL 25 MG PO TABS
25.0000 mg | ORAL_TABLET | Freq: Once | ORAL | Status: AC
  Administered 2020-06-07: 25 mg via ORAL
  Filled 2020-06-07: qty 1

## 2020-06-07 NOTE — ED Triage Notes (Signed)
Pt presents with pruritis that started today. Pt thinks she is having a reaction to a steroid shot she received 2 days ago. Pt has had previous steroid shots without any problems. Pt denies any ShOB or trouble breathing.

## 2020-06-08 ENCOUNTER — Emergency Department (HOSPITAL_BASED_OUTPATIENT_CLINIC_OR_DEPARTMENT_OTHER)
Admission: EM | Admit: 2020-06-08 | Discharge: 2020-06-08 | Disposition: A | Discharge status: Home / Self Care | Payer: Medicare Other | Attending: Emergency Medicine | Admitting: Emergency Medicine

## 2020-06-08 DIAGNOSIS — L509 Urticaria, unspecified: Secondary | ICD-10-CM | POA: Diagnosis not present

## 2020-06-08 MED ORDER — FAMOTIDINE 20 MG PO TABS
20.0000 mg | ORAL_TABLET | Freq: Two times a day (BID) | ORAL | 0 refills | Status: DC
Start: 2020-06-08 — End: 2021-01-27

## 2020-06-08 MED ORDER — FAMOTIDINE IN NACL 20-0.9 MG/50ML-% IV SOLN
20.0000 mg | Freq: Once | INTRAVENOUS | Status: AC
  Administered 2020-06-08: 20 mg via INTRAVENOUS
  Filled 2020-06-08: qty 50

## 2020-06-08 MED ORDER — HYDROXYZINE HCL 25 MG PO TABS
25.0000 mg | ORAL_TABLET | Freq: Four times a day (QID) | ORAL | 0 refills | Status: DC
Start: 2020-06-08 — End: 2021-01-27

## 2020-06-08 MED ORDER — DIPHENHYDRAMINE HCL 50 MG/ML IJ SOLN
25.0000 mg | Freq: Once | INTRAMUSCULAR | Status: AC
  Administered 2020-06-08: 25 mg via INTRAVENOUS
  Filled 2020-06-08: qty 1

## 2020-06-08 NOTE — ED Provider Notes (Signed)
TIME SEEN: 12:53 AM  CHIEF COMPLAINT: Hives  HPI: Patient is a 69 year old female who presents emergency department with diffuse itching and hives to her left upper extremity that started yesterday.  States 2 days ago she received a injection into her right hip.  States it was Kenalog and Marcaine which she has received before.  She states she started itching all over and then noticed hives to her left upper extremity.  No lip or tongue swelling.  No difficulty breathing.  No other new exposures including soaps, lotions, detergents, medications, food.  Last took 25 mg of Benadryl at 7:30 PM.  ROS: See HPI Constitutional: no fever  Eyes: no drainage  ENT: no runny nose   Cardiovascular:  no chest pain  Resp: no SOB  GI: no vomiting GU: no dysuria Integumentary: no rash  Allergy:  hives  Musculoskeletal: no leg swelling  Neurological: no slurred speech ROS otherwise negative  PAST MEDICAL HISTORY/PAST SURGICAL HISTORY:  Past Medical History:  Diagnosis Date   Acute bursitis of right shoulder 08/29/2018   Right shoulder injected in August 29, 2018   Allergic rhinitis    Closed displaced fracture of fifth metatarsal bone of left foot 03/16/2017   DEPRESSIVE DISORDER NOT ELSEWHERE CLASSIFIED 03/27/2010   Qualifier: Diagnosis of  By: [name] MD, [name]   Mr Christine Manila, NP , [facility] [place]     Diabetes ([place]) 04/02/2016   Diabetes mellitus    Essential hypertension 03/14/2008   Qualifier: Diagnosis of  By: [name] MD, [name]     Fibromyalgia 08/12/2011   Diagnosed by [name] , Rheumatologist 2010    GERD (gastroesophageal reflux disease) 12/25/2016   Gluteal tendonitis of right buttock 10/19/2017   Injected in October 19, 2017 Injected October 07, 2018   Greater trochanteric bursitis of left hip 06/17/2016   Greater trochanteric bursitis of right hip 11/27/2015   Injected 11/27/2015    History of recurrent UTIs    [name]   HTN (hypertension)     Hx of osteopenia 10/11/2012   [facility] DEXA; ordered by [name] Lowest T score: -2.1 @ lumbar spine @ [facility] 11/01/12; decrease of 8.5% vs 05/2009. There is 9.2% risk over 10 yrs History fracture left elbow @ 4 Fractured left wrist , right elbow and nose post fall July/18/2013. Dr. [name] No FH Osteoporosis No PMH of bisphosphonate therapy (generic Fosamax Rxed but not filled  due to polypharmacy )    Hyperlipidemia    Hyperlipidemia associated with type 2 diabetes mellitus ([place]) 01/18/2007   Qualifier: Diagnosis of  By: [name]  With DM LDL goal = < 100, ideally < 70. Mi in father @ 58; 2 brothers @ 20 & 50     Lumbar radiculopathy 04/11/2008   Qualifier: Diagnosis of  By: [name] MD, Erick Blinks well to epidural 01/12/2017  repeat epidural given May 2018   Migraine headache    quiescent   Nonallopathic lesion of cervical region 07/19/2014   Nonallopathic lesion of lumbosacral region 01/15/2016   Nonallopathic lesion of sacral region 06/27/2018   Nonallopathic lesion-rib cage 09/26/2014   Palpitations 04/15/2011   Rotator cuff impingement syndrome of left shoulder 05/11/2014   Seasonal allergic rhinitis 03/21/2012   Sinusitis, chronic 04/20/2016   Sleep disorder    Dr Dohmier   Subluxation of peroneal tendon of right foot 11/10/2017   TIA (transient ischemic attack)    [place], HX OF 02/02/2008   Qualifier: Diagnosis of  By: Donalee Citrin ADVRS EFF UNS RX MEDICINAL&BIOLOGICAL [place] 02/02/2008   Qualifier: Diagnosis of  By: [name] MD, Posey Pronto AND MYOSITIS 02/02/2008   Qualifier: Diagnosis of  By: [name] MD, [name]     UTI (urinary tract infection) 10/05/2014   Viral upper respiratory tract infection with cough 12/31/2014   Vitamin D deficiency 05/25/2008   Qualifier: Diagnosis of  By: [name] MD, [name]      MEDICATIONS:  Prior to Admission medications   Medication Sig Start Date End Date Taking? Authorizing  Provider  aspirin 81 MG tablet Take 81 mg by mouth daily.      [provider]  atorvastatin (LIPITOR) 80 MG tablet Take 1 tablet (80 mg total) by mouth daily. 04/25/20   [name], MD  busPIRone (BUSPAR) 15 MG tablet Take 15 mg by mouth 2 (two) times daily. 09/13/19   [provider]  DULoxetine (CYMBALTA) 60 MG capsule Take 60 mg by mouth daily.    [provider]  Insulin Glargine (BASAGLAR KWIKPEN) 100 UNIT/ML Inject 0.28 mLs (28 Units total) into the skin every morning. 06/04/20   Renato Shin, MD  Insulin Pen Needle (BD PEN NEEDLE NANO U/F) 32G X 4 MM MISC USE ONCE A DAY AS DIRECTED 06/29/19   Renato Shin, MD  Lancets [facility] ULTRASOFT) lancets Check blood sugar once daily. Dx code: 250.00 11/08/13   Hendricks Limes, MD  LORazepam (ATIVAN) 0.5 MG tablet Take 0.5 mg by mouth 2 (two) times daily as needed. For anxiety.    [provider]  losartan (COZAAR) 100 MG tablet Take 1 tablet (100 mg total) by mouth daily. 05/02/20   [name], MD  metoprolol tartrate (LOPRESSOR) 100 MG tablet Take one tablet by mouth 2 hours prior to your CT 11/15/19   Belva Crome, MD  naproxen (NAPROSYN) 500 MG tablet TAKE 1 TABLET (500 MG TOTAL) BY MOUTH 2 (TWO) TIMES DAILY WITH A MEAL. 11/29/19   Lyndal Pulley, DO  nitrofurantoin, macrocrystal-monohydrate, (MACROBID) 100 MG capsule Take 1 capsule (100 mg total) by mouth 2 (two) times daily. 05/31/20   [name], MD  omeprazole (PRILOSEC) 20 MG capsule Take 1 capsule (20 mg total) by mouth daily. 05/02/20   [name], MD  [facility] ULTRA test strip USE 1 STRIP TWICE DAILY DX CODE E11.65 10/04/19   Renato Shin, MD  pioglitazone (ACTOS) 15 MG tablet Take 1 tablet (15 mg total) by mouth daily. 01/10/19   Renato Shin, MD  promethazine (PHENERGAN) 12.5 MG tablet Take 1 tablet (12.5 mg total) by mouth every 8 (eight) hours as needed for nausea or vomiting. 10/12/18   Tomi Likens, Adam R, DO   tiZANidine (ZANAFLEX) 2 MG tablet TAKE 1 TABLET BY MOUTH EVERY 8 HOURS AS NEEDED FOR MUSCLE SPASM 05/22/20   Lyndal Pulley, DO  traMADol (ULTRAM) 50 MG tablet TAKE 1 TABLET (50 MG TOTAL) BY MOUTH EVERY 6 (SIX) HOURS AS NEEDED. 06/01/20   Lyndal Pulley, DO  traZODone (DESYREL) 50 MG tablet TAKE 0.5-1 TABLETS (25-50 MG TOTAL) BY MOUTH AT BEDTIME AS NEEDED FOR SLEEP. 12/18/19   [name], MD  Vitamin D, Ergocalciferol, (DRISDOL) 1.25 MG (50000 UNIT) CAPS capsule TAKE 1 CAPSULE BY MOUTH ONE TIME PER WEEK 04/03/20   Lyndal Pulley, DO    ALLERGIES:  Allergies  Allergen Reactions   Vioxx [Rofecoxib] Other (See Comments)    Excess  BP which caused TIA    Prednisone Other (See Comments)    Mental status changes with high-dose oral agent. Tolerates epidural steroid injections. No associated rash or fever   Colesevelam Other (See Comments)    Leg Pain    SOCIAL HISTORY:  Social History   Tobacco Use   Smoking status: Never Smoker   Smokeless tobacco: Never Used  Substance Use Topics   Alcohol use: No    FAMILY HISTORY: Family History  Problem Relation Age of Onset   Colon cancer Maternal Aunt    Diabetes Paternal Uncle    Heart attack Father 40   COPD Mother    Lung cancer Brother        smoker   Mental illness Other        niece, committed suicide.   Heart attack Other        brother X 2; @ 2 & 31   Stroke Neg Hx     EXAM: BP (!) 153/75 (BP Location: Right Arm)    Pulse (!) 104    Temp 98.1 F (36.7 C) (Oral)    Resp 20    Ht 5\' 6"  (1.676 m)    Wt 85.3 kg    LMP  (LMP Unknown)    SpO2 98%    BMI 30.34 kg/m  CONSTITUTIONAL: Alert and oriented and responds appropriately to questions. Well-appearing; well-nourished HEAD: Normocephalic EYES: Conjunctivae clear, pupils appear equal, EOM appear intact ENT: normal nose; moist mucous membranes, no angioedema, normal phonation, no stridor, no trismus or drooling NECK: Supple, normal ROM CARD: RRR; S1  and S2 appreciated; no murmurs, no clicks, no rubs, no gallops RESP: Normal chest excursion without splinting or tachypnea; breath sounds clear and equal bilaterally; no wheezes, no rhonchi, no rales, no hypoxia or respiratory distress, speaking full sentences ABD/GI: Normal bowel sounds; non-distended; soft, non-tender, no rebound, no guarding, no peritoneal signs, no hepatosplenomegaly BACK:  The back appears normal EXT: Normal ROM in all joints; no deformity noted, no edema; no cyanosis SKIN: Normal color for age and race; warm; urticaria noted to the inner left elbow NEURO: Moves all extremities equally PSYCH: The patient's mood and manner are appropriate.   MEDICAL DECISION MAKING: Patient here with pruritus and a small area of urticaria on exam.  Otherwise well-appearing.  She suspects that symptoms started after receiving Kenalog and Marcaine as an intra-articular injection a few days ago.  She denies any other exposures.  Given she is concerned that she may be having allergic reaction to a steroid, recommend avoiding prednisone, Solu-Medrol at this time.  Given Atarax in the waiting room without much relief.  Will give IV Benadryl, IV Pepcid and reassess.  ED PROGRESS: Patient's hives have now resolved and she reports feeling better.  Will discharge with prescription of Atarax, Pepcid.  Recommended follow-up with allergy specialist.  Discussed return precautions.  I do not feel she needs an EpiPen at this time.  At this time, I do not feel there is any life-threatening condition present. I have reviewed, interpreted and discussed all results (EKG, imaging, lab, urine as appropriate) and exam findings with patient/family. I have reviewed nursing notes and appropriate previous records.  I feel the patient is safe to be discharged home without further emergent workup and can continue workup as an outpatient as needed. Discussed usual and customary return precautions. Patient/family verbalize  understanding and are comfortable with this plan.  Outpatient follow-up has been provided as needed. All questions have been answered.  Christine [name] was evaluated in Emergency Department on 06/08/2020 for the symptoms described in the history of present illness. She was evaluated in the context of the global COVID-19 pandemic, which necessitated consideration that the patient might be at risk for infection with the SARS-CoV-2 virus that causes COVID-19. Institutional protocols and algorithms that pertain to the evaluation of patients at risk for COVID-19 are in a state of rapid change based on information released by regulatory bodies including the CDC and federal and state organizations. These policies and algorithms were followed during the patient's care in the ED.      Christine [name], Delice Bison, DO 06/08/20 [phone]

## 2020-06-08 NOTE — Discharge Instructions (Signed)
You may take benadryl 25 - 50 mg every 8 hours as needed for itching or rash but do not take along with Atarax as they both work in similar ways and the combination will make you very drowsy and could cause other side effects like confusion, hallucinations, dry mouth, urinary retention.

## 2020-06-09 ENCOUNTER — Other Ambulatory Visit: Payer: Self-pay

## 2020-06-09 ENCOUNTER — Emergency Department (HOSPITAL_COMMUNITY)
Admission: EM | Admit: 2020-06-09 | Discharge: 2020-06-09 | Disposition: A | Discharge status: Home / Self Care | Payer: Medicare Other | Attending: Emergency Medicine | Admitting: Emergency Medicine

## 2020-06-09 ENCOUNTER — Encounter (HOSPITAL_COMMUNITY): Payer: Self-pay | Admitting: Emergency Medicine

## 2020-06-09 ENCOUNTER — Emergency Department (HOSPITAL_COMMUNITY): Payer: Medicare Other

## 2020-06-09 DIAGNOSIS — Z889 Allergy status to unspecified drugs, medicaments and biological substances status: Secondary | ICD-10-CM

## 2020-06-09 DIAGNOSIS — E119 Type 2 diabetes mellitus without complications: Secondary | ICD-10-CM | POA: Diagnosis not present

## 2020-06-09 DIAGNOSIS — T368X5A Adverse effect of other systemic antibiotics, initial encounter: Secondary | ICD-10-CM | POA: Diagnosis not present

## 2020-06-09 DIAGNOSIS — Z79899 Other long term (current) drug therapy: Secondary | ICD-10-CM | POA: Diagnosis not present

## 2020-06-09 DIAGNOSIS — I2510 Atherosclerotic heart disease of native coronary artery without angina pectoris: Secondary | ICD-10-CM | POA: Diagnosis not present

## 2020-06-09 DIAGNOSIS — I10 Essential (primary) hypertension: Secondary | ICD-10-CM | POA: Insufficient documentation

## 2020-06-09 DIAGNOSIS — Z7982 Long term (current) use of aspirin: Secondary | ICD-10-CM | POA: Insufficient documentation

## 2020-06-09 DIAGNOSIS — L509 Urticaria, unspecified: Secondary | ICD-10-CM | POA: Diagnosis not present

## 2020-06-09 DIAGNOSIS — R21 Rash and other nonspecific skin eruption: Secondary | ICD-10-CM | POA: Diagnosis present

## 2020-06-09 DIAGNOSIS — Z794 Long term (current) use of insulin: Secondary | ICD-10-CM | POA: Diagnosis not present

## 2020-06-09 LAB — BASIC METABOLIC PANEL
Anion gap: 7 (ref 5–15)
BUN: 20 mg/dL (ref 8–23)
CO2: 28 mmol/L (ref 22–32)
Calcium: 9.2 mg/dL (ref 8.9–10.3)
Chloride: 101 mmol/L (ref 98–111)
Creatinine, Ser: 0.81 mg/dL (ref 0.44–1.00)
GFR calc Af Amer: >60 mL/min (ref >60)
GFR calc non Af Amer: >60 mL/min (ref >60)
Glucose, Bld: 143 mg/dL — ABNORMAL HIGH (ref 70–99)
Potassium: 3.6 mmol/L (ref 3.5–5.1)
Sodium: 136 mmol/L (ref 135–145)

## 2020-06-09 LAB — CBC
HCT: 40.2 % (ref 36.0–46.0)
Hemoglobin: 13 g/dL (ref 12.0–15.0)
MCH: 30.4 pg (ref 26.0–34.0)
MCHC: 32.3 g/dL (ref 30.0–36.0)
MCV: 94.1 fL (ref 80.0–100.0)
Platelets: 193 10*3/uL (ref 150–400)
RBC: 4.27 MIL/uL (ref 3.87–5.11)
RDW: 13.2 % (ref 11.5–15.5)
WBC: 7.6 10*3/uL (ref 4.0–10.5)
nRBC: 0 % (ref 0.0–0.2)

## 2020-06-09 LAB — TROPONIN I (HIGH SENSITIVITY): Troponin I (High Sensitivity): 3 ng/L (ref <18)

## 2020-06-09 MED ORDER — DIPHENHYDRAMINE HCL 50 MG/ML IJ SOLN
25.0000 mg | Freq: Once | INTRAMUSCULAR | Status: AC
  Administered 2020-06-09: 25 mg via INTRAVENOUS
  Filled 2020-06-09: qty 1

## 2020-06-09 MED ORDER — SODIUM CHLORIDE 0.9 % IV BOLUS
500.0000 mL | Freq: Once | INTRAVENOUS | Status: AC
  Administered 2020-06-09: 500 mL via INTRAVENOUS

## 2020-06-09 MED ORDER — FAMOTIDINE IN NACL 20-0.9 MG/50ML-% IV SOLN
20.0000 mg | Freq: Once | INTRAVENOUS | Status: AC
  Administered 2020-06-09: 20 mg via INTRAVENOUS
  Filled 2020-06-09: qty 50

## 2020-06-09 MED ORDER — TRAMADOL HCL 50 MG PO TABS
50.0000 mg | ORAL_TABLET | Freq: Once | ORAL | Status: AC
  Administered 2020-06-09: 50 mg via ORAL
  Filled 2020-06-09: qty 1

## 2020-06-09 NOTE — ED Triage Notes (Signed)
Was seen at [facility] on the 26th for the dame symptoms-states she started having right chest pressure this am-some times it takes her "breath away"-these symptoms new since her last visit

## 2020-06-09 NOTE — ED Notes (Signed)
Patient has finished IV medications. Reports itching has stopped and even "got rid of a welt" on her wrist. Writer updated provider.

## 2020-06-09 NOTE — Discharge Instructions (Addendum)
It appears that you are allergic to Macrodantin.  Do not take Macrodantin again.  Continue taking the Benadryl and Pepcid, as needed for itching or rash.  Follow-up with your primary care doctor if needed for problems.

## 2020-06-09 NOTE — ED Notes (Signed)
When discharging patient , patient stated she was unable to read AVS due to new onset blurred vision. Notified MD [facility]. MD assessed patient. Patient told Dr. Eulis Foster that she felt better and was just developing a migraine. Tramadol ordered and given. Patient left ED stable and ambulatory with husband.

## 2020-06-09 NOTE — ED Provider Notes (Addendum)
[facility] DEPT Provider Note   CSN: [id number] Arrival date & time: 06/09/20  0932     History Chief Complaint  Patient presents with  . Urticaria  . Chest Pain    Christine [name] is a 69 y.o. female.  HPI She presents for evaluation of persistent rash for 2 days.  She is using Pepcid, and alternating hydroxyzine and diphenhydramine, with some mild improvement.  She completed a course of Macrobid, yesterday.  This was treatment for UTI.  She states her urine symptoms have improved.  She was seen yesterday morning, felt to have an allergic reaction possibly to Marcaine.  She had been given a injection in her right hip for pain, 5 days ago.  This morning she noticed some mild chest discomfort.  She decided to come here for further evaluation, because of the persistent rash.  She denies fever, chills, weakness or dizziness.  She reports having another course of Macrobid about a month ago for UTI.  There are no other known modifying factors.    Past Medical History:  Diagnosis Date  . Acute bursitis of right shoulder 08/29/2018   Right shoulder injected in August 29, 2018  . Allergic rhinitis   . Closed displaced fracture of fifth metatarsal bone of left foot 03/16/2017  . DEPRESSIVE DISORDER NOT ELSEWHERE CLASSIFIED 03/27/2010   Qualifier: Diagnosis of  By: [name] MD, [name]   [name], NP , [facility] [place]    . Diabetes ([place]) 04/02/2016  . Diabetes mellitus   . Essential hypertension 03/14/2008   Qualifier: Diagnosis of  By: [name] MD, [name]    . Fibromyalgia 08/12/2011   Diagnosed by [name] , Rheumatologist 2010   . GERD (gastroesophageal reflux disease) 12/25/2016  . Gluteal tendonitis of right buttock 10/19/2017   Injected in October 19, 2017 Injected October 07, 2018  . Greater trochanteric bursitis of left hip 06/17/2016  . Greater trochanteric bursitis of right hip 11/27/2015   Injected 11/27/2015   . History  of recurrent UTIs    [name]  . HTN (hypertension)   . Hx of osteopenia 10/11/2012   [facility] DEXA; ordered by [name] Lowest T score: -2.1 @ lumbar spine @ [facility] 11/01/12; decrease of 8.5% vs 05/2009. There is 9.2% risk over 10 yrs History fracture left elbow @ 4 Fractured left wrist , right elbow and nose post fall July/18/2013. Dr. [name] No FH Osteoporosis No PMH of bisphosphonate therapy (generic Fosamax Rxed but not filled  due to polypharmacy )   . Hyperlipidemia   . Hyperlipidemia associated with type 2 diabetes mellitus ([place]) 01/18/2007   Qualifier: Diagnosis of  By: [name]  With DM LDL goal = < 100, ideally < 70. Mi in father @ 66; 2 brothers @ 39 & 101    . Lumbar radiculopathy 04/11/2008   Qualifier: Diagnosis of  By: [name] MD, Erick Blinks well to epidural 01/12/2017  repeat epidural given May 2018  . Migraine headache    quiescent  . Nonallopathic lesion of cervical region 07/19/2014  . Nonallopathic lesion of lumbosacral region 01/15/2016  . Nonallopathic lesion of sacral region 06/27/2018  . Nonallopathic lesion-rib cage 09/26/2014  . Palpitations 04/15/2011  . Rotator cuff impingement syndrome of left shoulder 05/11/2014  . Seasonal allergic rhinitis 03/21/2012  . Sinusitis, chronic 04/20/2016  . Sleep disorder    [name] May  . Subluxation of peroneal tendon of right foot 11/10/2017  . TIA (transient ischemic  attack)   . TRANSIENT ISCHEMIC ATTACKS, HX OF 02/02/2008   Qualifier: Diagnosis of  By: [name]    . UNS ADVRS EFF UNS RX MEDICINAL&BIOLOGICAL SBSTNC 02/02/2008   Qualifier: Diagnosis of  By: [name] MD, [name]    . UNSPECIFIED MYALGIA AND MYOSITIS 02/02/2008   Qualifier: Diagnosis of  By: [name] MD, [name]    . UTI (urinary tract infection) 10/05/2014  . Viral upper respiratory tract infection with cough 12/31/2014  . Vitamin D deficiency 05/25/2008   Qualifier: Diagnosis of  By: [name] MD, [name]      Patient Active Problem List   Diagnosis  Date Noted  . Cloudy urine 05/31/2020  . Cervical disc disorder with radiculopathy of cervical region 01/25/2020  . Coronary atherosclerosis of native coronary artery 01/25/2020  . Nonallopathic lesion of sacral region 06/27/2018  . Dysuria 03/08/2018  . Subluxation of peroneal tendon of right foot 11/10/2017  . Lumbar radiculopathy, right 06/23/2017  . Closed displaced fracture of fifth metatarsal bone of left foot 03/16/2017  . GERD (gastroesophageal reflux disease) 12/25/2016  . Greater trochanteric bursitis, right 06/17/2016  . Diabetes ([place]) 04/02/2016  . Nonallopathic lesion of lumbosacral region 01/15/2016  . Routine general medical examination at a health care facility 11/25/2015  . Nonallopathic lesion of thoracic region 09/26/2014  . Nonallopathic lesion-rib cage 09/26/2014  . Nonallopathic lesion of cervical region 07/19/2014  . Hx of osteopenia 10/11/2012  . Seasonal allergic rhinitis 03/21/2012  . Fibromyalgia 08/12/2011  . Palpitations 04/15/2011  . DEPRESSIVE DISORDER NOT ELSEWHERE CLASSIFIED 03/27/2010  . Vitamin D deficiency 05/25/2008  . Lumbar radiculopathy 04/11/2008  . Essential hypertension 03/14/2008  . Myalgia due to HMG CoA reductase inhibitor 02/02/2008  . History of cardiovascular disorder 02/02/2008  . Hyperlipidemia associated with type 2 diabetes mellitus ([place]) 01/18/2007    Past Surgical History:  Procedure Laterality Date  . COLONOSCOPY     Dr Carlean [name]; hemorrhoids  . CORONARY ANGIOPLASTY  1997   for chest pain- negative   . POLYPECTOMY  2002   benign, hyperplastic polyp; rectal bleeding 2008  . TONSILLECTOMY    . TOTAL ABDOMINAL HYSTERECTOMY  1983   BSO for endometriosis  . WISDOM TOOTH EXTRACTION       OB History   No obstetric history on file.     Family History  Problem Relation Age of Onset  . Colon cancer Maternal Aunt   . Diabetes Paternal Uncle   . Heart attack Father 25  . COPD Mother   . Lung cancer Brother        smoker   . Mental illness Other        niece, committed suicide.  [name] Heart attack Other        brother X 2; @ 93 & 52  . Stroke Neg Hx     Social History   Tobacco Use  . Smoking status: Never Smoker  . Smokeless tobacco: Never Used  Vaping Use  . Vaping Use: Never used  Substance Use Topics  . Alcohol use: No  . Drug use: No    Home Medications Prior to Admission medications   Medication Sig Start Date End Date Taking? Authorizing Provider  aspirin 81 MG tablet Take 81 mg by mouth daily.      [provider]  atorvastatin (LIPITOR) 80 MG tablet Take 1 tablet (80 mg total) by mouth daily. 04/25/20   [name], MD  busPIRone (BUSPAR) 15 MG tablet Take 15 mg by mouth 2 (  two) times daily. 09/13/19   [provider]  DULoxetine (CYMBALTA) 60 MG capsule Take 60 mg by mouth daily.    [provider]  famotidine (PEPCID) 20 MG tablet Take 1 tablet (20 mg total) by mouth 2 (two) times daily. 06/08/20   Ward, Delice Bison, DO  hydrOXYzine (ATARAX/VISTARIL) 25 MG tablet Take 1 tablet (25 mg total) by mouth every 6 (six) hours. 06/08/20   Ward, Delice Bison, DO  Insulin Glargine (BASAGLAR KWIKPEN) 100 UNIT/ML Inject 0.28 mLs (28 Units total) into the skin every morning. 06/04/20   Renato Shin, MD  Insulin Pen Needle (BD PEN NEEDLE NANO U/F) 32G X 4 MM MISC USE ONCE A DAY AS DIRECTED 06/29/19   Renato Shin, MD  Lancets [facility] ULTRASOFT) lancets Check blood sugar once daily. Dx code: 250.00 11/08/13   Hendricks Limes, MD  LORazepam (ATIVAN) 0.5 MG tablet Take 0.5 mg by mouth 2 (two) times daily as needed. For anxiety.    [provider]  losartan (COZAAR) 100 MG tablet Take 1 tablet (100 mg total) by mouth daily. 05/02/20   [name], MD  metoprolol tartrate (LOPRESSOR) 100 MG tablet Take one tablet by mouth 2 hours prior to your CT 11/15/19   Belva Crome, MD  naproxen (NAPROSYN) 500 MG tablet TAKE 1 TABLET (500 MG TOTAL) BY MOUTH 2 (TWO) TIMES  DAILY WITH A MEAL. 11/29/19   Lyndal Pulley, DO  nitrofurantoin, macrocrystal-monohydrate, (MACROBID) 100 MG capsule Take 1 capsule (100 mg total) by mouth 2 (two) times daily. 05/31/20   [name], MD  omeprazole (PRILOSEC) 20 MG capsule Take 1 capsule (20 mg total) by mouth daily. 05/02/20   [name], MD  [facility] ULTRA test strip USE 1 STRIP TWICE DAILY DX CODE E11.65 10/04/19   Renato Shin, MD  pioglitazone (ACTOS) 15 MG tablet Take 1 tablet (15 mg total) by mouth daily. 01/10/19   Renato Shin, MD  promethazine (PHENERGAN) 12.5 MG tablet Take 1 tablet (12.5 mg total) by mouth every 8 (eight) hours as needed for nausea or vomiting. 10/12/18   Tomi Likens, Adam R, DO  tiZANidine (ZANAFLEX) 2 MG tablet TAKE 1 TABLET BY MOUTH EVERY 8 HOURS AS NEEDED FOR MUSCLE SPASM 05/22/20   Lyndal Pulley, DO  traMADol (ULTRAM) 50 MG tablet TAKE 1 TABLET (50 MG TOTAL) BY MOUTH EVERY 6 (SIX) HOURS AS NEEDED. 06/01/20   Lyndal Pulley, DO  traZODone (DESYREL) 50 MG tablet TAKE 0.5-1 TABLETS (25-50 MG TOTAL) BY MOUTH AT BEDTIME AS NEEDED FOR SLEEP. 12/18/19   [name], MD  Vitamin D, Ergocalciferol, (DRISDOL) 1.25 MG (50000 UNIT) CAPS capsule TAKE 1 CAPSULE BY MOUTH ONE TIME PER WEEK 04/03/20   Lyndal Pulley, DO    Allergies    Vioxx [rofecoxib], Prednisone, and Colesevelam  Review of Systems   Review of Systems  All other systems reviewed and are negative.   Physical Exam Updated Vital Signs BP 139/67   Pulse 85   Temp (!) 97.5 F (36.4 C) (Oral)   Resp 15   LMP  (LMP Unknown)   SpO2 99%   Physical Exam Vitals and nursing note reviewed.  Constitutional:      General: She is not in acute distress.    Appearance: She is well-developed. She is not ill-appearing, toxic-appearing or diaphoretic.  HENT:     Head: Normocephalic and atraumatic.     Right Ear: External ear normal.     Left  Ear: External ear normal.  Eyes:     Conjunctiva/sclera: Conjunctivae  normal.     Pupils: Pupils are equal, round, and reactive to light.  Neck:     Trachea: Phonation normal.  Cardiovascular:     Rate and Rhythm: Normal rate and regular rhythm.     Heart sounds: Normal heart sounds.  Pulmonary:     Effort: Pulmonary effort is normal.     Breath sounds: Normal breath sounds.  Abdominal:     General: There is no distension.     Palpations: Abdomen is soft.     Tenderness: There is no abdominal tenderness.  Musculoskeletal:        General: Normal range of motion.     Cervical back: Normal range of motion and neck supple.  Skin:    General: Skin is warm and dry.  Neurological:     Mental Status: She is alert and oriented to person, place, and time.     Cranial Nerves: No cranial nerve deficit.     Sensory: No sensory deficit.     Motor: No abnormal muscle tone.     Coordination: Coordination normal.  Psychiatric:        Mood and Affect: Mood normal.        Behavior: Behavior normal.        Thought Content: Thought content normal.        Judgment: Judgment normal.     ED Results / Procedures / Treatments   Labs (all labs ordered are listed, but only abnormal results are displayed) Labs Reviewed  BASIC METABOLIC PANEL - Abnormal; Notable for the following components:      Result Value   Glucose, Bld 143 (*)    All other components within normal limits  CBC  TROPONIN I (HIGH SENSITIVITY)  TROPONIN I (HIGH SENSITIVITY)    EKG None   Date: 06/09/20  Rate: 104  Rhythm: normal sinus rhythm  QRS Axis: normal  PR and QT Intervals: normal  ST/T Wave abnormalities: normal  PR and QRS Conduction Disutrbances:none    Radiology DG Chest 2 View  Result Date: 06/09/2020 CLINICAL DATA:  Right chest pain and weakness since this morning. EXAM: CHEST - 2 VIEW COMPARISON:  09/01/2019 FINDINGS: Normal sized heart. Stable minimal linear scarring at the left lung base. Otherwise, clear lungs with normal vascularity. Stable minimal peribronchial  thickening and mild scoliosis. IMPRESSION: No acute abnormality. Stable minimal chronic bronchitic changes. Electronically Signed   By: Claudie Revering M.D.   On: 06/09/2020 10:32    Procedures Procedures (including critical care time)  Medications Ordered in ED Medications  sodium chloride 0.9 % bolus 500 mL (0 mLs Intravenous Stopped 06/09/20 1411)  diphenhydrAMINE (BENADRYL) injection 25 mg (25 mg Intravenous Given 06/09/20 1307)  famotidine (PEPCID) IVPB 20 mg premix (0 mg Intravenous Stopped 06/09/20 1412)    ED Course  I have reviewed the triage vital signs and the nursing notes.  Pertinent labs & imaging results that were available during my care of the patient were reviewed by me and considered in my medical decision making (see chart for details).    MDM Rules/Calculators/A&P                           Patient Vitals for the past 24 hrs:  BP Temp Temp src Pulse Resp SpO2  06/09/20 1401 139/67 -- -- 85 15 99 %  06/09/20 1155 132/67 -- -- 86 14 99 %  06/09/20 0943 123/89 (!) 97.5 F (36.4 C) Oral (!) 111 20 100 %    2:27 PM Reevaluation with update and discussion. After initial assessment and treatment, an updated evaluation reveals she states the itching is gone, and that her rash is better.  Findings discussed with the patient and her husband, all questions answered. Daleen Bo   Medical Decision Making:  This patient is presenting for evaluation of pruritic rash, which does require a range of treatment options, and is a complaint that involves a moderate risk of morbidity and mortality. The differential diagnoses include drug allergy, viral illness, stressors.. I decided to review old records, and in summary elderly female, who completed course of Macrodantin yesterday, treatment for UTI.  Onset of rash while on Macrodantin..  I did not require additional historical information from anyone.    Critical Interventions-clinical evaluation, medication treatment, observation  reassessment  After These Interventions, the Patient was reevaluated and was found improved and stable for discharge.  Patient had 2 exposures to medication, Marcaine with lidocaine, and Macrodantin.  It is more likely that she is allergic to Macrodantin.  No evidence for internal allergy or angioedema.  Stable for discharge with outpatient management  CRITICAL CARE-no Performed by: Daleen Bo  Nursing Notes Reviewed/ Care Coordinated Applicable Imaging Reviewed Interpretation of Laboratory Data incorporated into ED treatment  The patient appears reasonably screened and/or stabilized for discharge and I doubt any other medical condition or other [facility] requiring further screening, evaluation, or treatment in the ED at this time prior to discharge.  Plan: Home Medications-continue usual medications as well as Benadryl/Pepcid; Home Treatments-rest, fluids; return here if the recommended treatment, does not improve the symptoms; Recommended follow up-PCP, as needed     Final Clinical Impression(s) / ED Diagnoses Final diagnoses:  Drug allergy    Rx / DC Orders ED Discharge Orders    None       Daleen Bo, MD 06/09/20 1429  Following discharge, the patient was giving the patient the discharge paperwork and she noticed that she was having double vision, while she was reading.  I went to see the patient at 2:40 PM, at this time she states that the double vision is improving, and is now just a little blurry.  She has had a sensation of flashing lights within the last 30 minutes.  She states that the symptoms are typical of migraines for her which she gets occasionally.  She typically takes tramadol with relief.  Tramadol ordered.  We will proceed with discharge when the patient is comfortable.   Daleen Bo, MD 06/09/20 1447    Daleen Bo, MD 06/09/20 1530

## 2020-06-11 DIAGNOSIS — F339 Major depressive disorder, recurrent, unspecified: Secondary | ICD-10-CM | POA: Diagnosis not present

## 2020-06-11 DIAGNOSIS — F410 Panic disorder [episodic paroxysmal anxiety] without agoraphobia: Secondary | ICD-10-CM | POA: Diagnosis not present

## 2020-06-18 ENCOUNTER — Other Ambulatory Visit: Payer: Self-pay | Admitting: Family Medicine

## 2020-06-26 ENCOUNTER — Other Ambulatory Visit: Payer: Self-pay

## 2020-06-26 ENCOUNTER — Ambulatory Visit (INDEPENDENT_AMBULATORY_CARE_PROVIDER_SITE_OTHER): Payer: Medicare Other | Admitting: *Deleted

## 2020-06-26 DIAGNOSIS — E538 Deficiency of other specified B group vitamins: Secondary | ICD-10-CM

## 2020-06-26 MED ORDER — CYANOCOBALAMIN 1000 MCG/ML IJ SOLN
1000.0000 ug | Freq: Once | INTRAMUSCULAR | Status: AC
  Administered 2020-06-26: 1000 ug via INTRAMUSCULAR

## 2020-06-26 NOTE — Progress Notes (Signed)
Pls cosign for B12 inj in absence of PCP this afternoon.Marland KitchenJohny [name]

## 2020-07-07 ENCOUNTER — Other Ambulatory Visit: Payer: Self-pay | Admitting: Internal Medicine

## 2020-07-12 ENCOUNTER — Encounter: Payer: Self-pay | Admitting: Endocrinology

## 2020-07-12 ENCOUNTER — Ambulatory Visit (INDEPENDENT_AMBULATORY_CARE_PROVIDER_SITE_OTHER): Payer: Medicare Other | Admitting: Internal Medicine

## 2020-07-12 ENCOUNTER — Other Ambulatory Visit: Payer: Self-pay

## 2020-07-12 ENCOUNTER — Encounter: Payer: Self-pay | Admitting: Internal Medicine

## 2020-07-12 VITALS — BP 128/86 | HR 93 | Temp 98.1°F | Ht 66.0 in | Wt 192.0 lb

## 2020-07-12 DIAGNOSIS — R0600 Dyspnea, unspecified: Secondary | ICD-10-CM | POA: Diagnosis not present

## 2020-07-12 DIAGNOSIS — R300 Dysuria: Secondary | ICD-10-CM

## 2020-07-12 DIAGNOSIS — R399 Unspecified symptoms and signs involving the genitourinary system: Secondary | ICD-10-CM

## 2020-07-12 DIAGNOSIS — M7989 Other specified soft tissue disorders: Secondary | ICD-10-CM | POA: Diagnosis not present

## 2020-07-12 DIAGNOSIS — I2510 Atherosclerotic heart disease of native coronary artery without angina pectoris: Secondary | ICD-10-CM

## 2020-07-12 LAB — POCT URINALYSIS DIPSTICK
Blood, UA: NEGATIVE
Glucose, UA: NEGATIVE
Nitrite, UA: NEGATIVE
Protein, UA: POSITIVE — AB
Spec Grav, UA: 1.02 (ref 1.010–1.025)
Urobilinogen, UA: 0.2 E.U./dL
pH, UA: 6 (ref 5.0–8.0)

## 2020-07-12 MED ORDER — CEPHALEXIN 500 MG PO CAPS: 500.0000 mg | ORAL_CAPSULE | Freq: Two times a day (BID) | ORAL | 0 refills | Status: DC

## 2020-07-12 NOTE — Progress Notes (Signed)
   Subjective:   Patient ID: Christine [name], female    DOB: 1951/12/05, 69 y.o.   MRN: [id number]  HPI The patient is a 69 YO female coming in for concerns about UTI  (having odor and pain, denies flank pain or nausea or vomiting, had prior allergic reaction to macrobid and is over that now) and leg swelling (started about 2 weeks ago, family was in town, admits they are eating food out but she is still trying to eat healthy, denies excessive salt intake, never going down but get better with lying in bed at night, get worse throughout the day, did go to beach and back but swelling started before that, mostly in feet, recent dose decrease in actos now taking 15 mg daily, denies SOB at exertion or with lying flat).   Review of Systems  Constitutional: Negative.   HENT: Negative.   Eyes: Negative.   Respiratory: Negative.  Negative for cough, chest tightness and shortness of breath.   Cardiovascular: Positive for leg swelling. Negative for chest pain and palpitations.  Gastrointestinal: Negative for abdominal distention, abdominal pain, constipation, diarrhea, nausea and vomiting.  Genitourinary: Positive for dysuria. Negative for frequency and urgency.       Urinary odor  Musculoskeletal: Negative.   Skin: Negative.   Neurological: Negative.   Psychiatric/Behavioral: Negative.     Objective:  Physical Exam Constitutional:      Appearance: She is well-developed.  HENT:     Head: Normocephalic and atraumatic.  Cardiovascular:     Rate and Rhythm: Normal rate and regular rhythm.  Pulmonary:     Effort: Pulmonary effort is normal. No respiratory distress.     Breath sounds: Normal breath sounds. No wheezing or rales.  Abdominal:     General: Bowel sounds are normal. There is no distension.     Palpations: Abdomen is soft.     Tenderness: There is no abdominal tenderness. There is no rebound.  Musculoskeletal:     Cervical back: Normal range of motion.     Right lower leg:  Edema present.     Left lower leg: Edema present.     Comments: Pitting edema in the feet, left more than right, to the midshin left and lower shin right sided  Skin:    General: Skin is warm and dry.  Neurological:     Mental Status: She is alert and oriented to person, place, and time.     Coordination: Coordination normal.     Vitals:   07/12/20 1015  BP: (!) 128/86  Pulse: 93  Temp: 98.1 F (36.7 C)  TempSrc: Oral  SpO2: 96%  Weight: 192 lb (87.1 kg)  Height: 5\' 6"  (1.676 m)   This visit occurred during the SARS-CoV-2 public health emergency.  Safety protocols were in place, including screening questions prior to the visit, additional usage of staff PPE, and extensive cleaning of exam room while observing appropriate contact time as indicated for disinfecting solutions.   Assessment & Plan:

## 2020-07-12 NOTE — Assessment & Plan Note (Signed)
POC U/A done in office consistent with infection. Rx keflex 5 day course.

## 2020-07-12 NOTE — Patient Instructions (Addendum)
We will check the labs today to look at the fluid.   We have sent in the keflex to take 1 pill twice a day for 5 days to clear the infection.

## 2020-07-12 NOTE — Assessment & Plan Note (Signed)
Checking CBC, CMP, BNP to exclude etiology. We talked about how actos can do this and if no cause she should stop this medication. If labs normal will rx small dose lasix to take temporarily.

## 2020-07-13 LAB — COMPREHENSIVE METABOLIC PANEL
AG Ratio: 1.8 (calc) (ref 1.0–2.5)
ALT: 20 U/L (ref 6–29)
AST: 19 U/L (ref 10–35)
Albumin: 3.9 g/dL (ref 3.6–5.1)
Alkaline phosphatase (APISO): 61 U/L (ref 37–153)
BUN: 15 mg/dL (ref 7–25)
CO2: 29 mmol/L (ref 20–32)
Calcium: 9.3 mg/dL (ref 8.6–10.4)
Chloride: 104 mmol/L (ref 98–110)
Creat: 0.66 mg/dL (ref 0.60–0.93)
Globulin: 2.2 g/dL (calc) (ref 1.9–3.7)
Glucose, Bld: 174 mg/dL — ABNORMAL HIGH (ref 65–99)
Potassium: 3.9 mmol/L (ref 3.5–5.3)
Sodium: 140 mmol/L (ref 135–146)
Total Bilirubin: 0.4 mg/dL (ref 0.2–1.2)
Total Protein: 6.1 g/dL (ref 6.1–8.1)

## 2020-07-13 LAB — CBC
HCT: 35.8 % (ref 35.0–45.0)
Hemoglobin: 12 g/dL (ref 11.7–15.5)
MCH: 30.5 pg (ref 27.0–33.0)
MCHC: 33.5 g/dL (ref 32.0–36.0)
MCV: 91.1 fL (ref 80.0–100.0)
MPV: 9.5 fL (ref 7.5–12.5)
Platelets: 208 10*3/uL (ref 140–400)
RBC: 3.93 10*6/uL (ref 3.80–5.10)
RDW: 12.2 % (ref 11.0–15.0)
WBC: 5.7 10*3/uL (ref 3.8–10.8)

## 2020-07-13 LAB — BRAIN NATRIURETIC PEPTIDE: Brain Natriuretic Peptide: 31 pg/mL (ref <100)

## 2020-07-17 ENCOUNTER — Ambulatory Visit (INDEPENDENT_AMBULATORY_CARE_PROVIDER_SITE_OTHER): Payer: Medicare Other | Admitting: Family Medicine

## 2020-07-17 ENCOUNTER — Encounter: Payer: Self-pay | Admitting: Family Medicine

## 2020-07-17 ENCOUNTER — Other Ambulatory Visit: Payer: Self-pay

## 2020-07-17 VITALS — BP 124/82 | HR 66 | Ht 66.0 in | Wt 189.0 lb

## 2020-07-17 DIAGNOSIS — M999 Biomechanical lesion, unspecified: Secondary | ICD-10-CM

## 2020-07-17 DIAGNOSIS — I2510 Atherosclerotic heart disease of native coronary artery without angina pectoris: Secondary | ICD-10-CM

## 2020-07-17 DIAGNOSIS — M5010 Cervical disc disorder with radiculopathy, unspecified cervical region: Secondary | ICD-10-CM | POA: Diagnosis not present

## 2020-07-17 DIAGNOSIS — IMO0001 Reserved for inherently not codable concepts without codable children: Secondary | ICD-10-CM

## 2020-07-17 DIAGNOSIS — S9304XS Dislocation of right ankle joint, sequela: Secondary | ICD-10-CM | POA: Diagnosis not present

## 2020-07-17 NOTE — Progress Notes (Signed)
[place] Phone: [phone] Subjective:   Christine [name], am serving as a scribe for Christine. Hulan [name]. This visit occurred during the SARS-CoV-2 public health emergency.  Safety protocols were in place, including screening questions prior to the visit, additional usage of staff PPE, and extensive cleaning of exam room while observing appropriate contact time as indicated for disinfecting solutions.   I'm seeing this patient by the request  of:  Christine Koch, MD  CC: Neck pain follow-up, new onset ankle pain  [id number]  Christine [name] is a 69 y.o. female coming in with complaint of back and neck pain. OMT 06/05/2020. Patient states that she had hives and swelling in her legs and feet from UTI. Patient is having pain in both feet over lateral aspect below malleolus. L>R.  Patient has had problems with her ankle previously and has had stress fracture previously.  Patient states that he is getting better since the swelling in her lower extremities have gotten better but not as much as she would like.  Would state that she is not back to her baseline yet  Medications patient has been prescribed: Zanaflex, tramadol, Cymbalta  Taking: Yes         Reviewed prior external information including notes and imaging from previsou exam, outside providers and external EMR if available.   As well as notes that were available from care everywhere and other healthcare systems.  Past medical history, social, surgical and family history all reviewed in electronic medical record.  [name] pertanent information unless stated regarding to the chief complaint.   Past Medical History:  Diagnosis Date  . Acute bursitis of right shoulder 08/29/2018   Right shoulder injected in August 29, 2018  . Allergic rhinitis   . Closed displaced fracture of fifth metatarsal bone of left foot 03/16/2017  . DEPRESSIVE DISORDER NOT  ELSEWHERE CLASSIFIED 03/27/2010   Qualifier: Diagnosis of  By: Christine Darner MD, Christine [name]   Christine [name] , [facility] [place]    . Diabetes ([place]) 04/02/2016  . Diabetes mellitus   . Essential hypertension 03/14/2008   Qualifier: Diagnosis of  By: Christine Darner MD, Christine [name]    . Fibromyalgia 08/12/2011   Diagnosed by Christine [name] , Rheumatologist 2010   . GERD (gastroesophageal reflux disease) 12/25/2016  . Gluteal tendonitis of right buttock 10/19/2017   Injected in October 19, 2017 Injected October 07, 2018  . Greater trochanteric bursitis of left hip 06/17/2016  . Greater trochanteric bursitis of right hip 11/27/2015   Injected 11/27/2015   . History of recurrent UTIs    Christine [name]  . HTN (hypertension)   . Hx of osteopenia 10/11/2012   [facility]EXA; ordered by Christine [name] Lowest T score: -2.1 @ lumbar spine @ [facility] 11/01/12; decrease of 8.5% vs 05/2009. There is 9.2% risk over 10 yrs History fracture left elbow @ 4 Fractured left wrist , right elbow and nose post fall July/18/2013. Christine. [name] FH Osteoporosis [name] PMH of bisphosphonate therapy (generic Fosamax Rxed but not filled  due to polypharmacy )   . Hyperlipidemia   . Hyperlipidemia associated with type 2 diabetes mellitus ([place]) 01/18/2007   Qualifier: Diagnosis of  By: Christine [name]  With DM LDL goal = < 100, ideally < 70. Mi in father @ 27; 2 brothers @ 45 & 10    . Lumbar radiculopathy 04/11/2008   Qualifier: Diagnosis of  By: Christine Darner  MD, Christine [name] well to epidural 01/12/2017  repeat epidural given [name] 2018  . Migraine headache    quiescent  . Nonallopathic lesion of cervical region 07/19/2014  . Nonallopathic lesion of lumbosacral region 01/15/2016  . Nonallopathic lesion of sacral region 06/27/2018  . Nonallopathic lesion-rib cage 09/26/2014  . Palpitations 04/15/2011  . Rotator cuff impingement syndrome of left shoulder 05/11/2014  . Seasonal allergic rhinitis 03/21/2012  . Sinusitis, chronic 04/20/2016  . Sleep disorder    Christine  Beacher [name]  . Subluxation of peroneal tendon of right foot 11/10/2017  . TIA (transient ischemic attack)   . TRANSIENT ISCHEMIC ATTACKS, HX OF 02/02/2008   Qualifier: Diagnosis of  By: Christine [name]    . UNS ADVRS EFF UNS RX MEDICINAL&BIOLOGICAL SBSTNC 02/02/2008   Qualifier: Diagnosis of  By: Christine Darner MD, Christine [name]    . UNSPECIFIED MYALGIA AND MYOSITIS 02/02/2008   Qualifier: Diagnosis of  By: Christine Darner MD, Christine [name]    . UTI (urinary tract infection) 10/05/2014  . Viral upper respiratory tract infection with cough 12/31/2014  . Vitamin D deficiency 05/25/2008   Qualifier: Diagnosis of  By: Christine Darner MD, Christine [name]      Allergies  Allergen Reactions  . Vioxx [Rofecoxib] Other (See Comments)    Excess BP which caused TIA   . Prednisone Other (See Comments)    Mental status changes with high-dose oral agent. Tolerates epidural steroid injections. [name] associated rash or fever  . Macrodantin [Nitrofurantoin Macrocrystal] Hives  . Colesevelam Other (See Comments)    Leg Pain     Review of Systems:  [name] , visual changes, nausea, vomiting, diarrhea, constipation, dizziness, abdominal pain, skin rash, fevers, chills, night sweats, weight loss, swollen lymph nodes,  joint swelling, chest pain, shortness of breath, mood changes. POSITIVE muscle aches, headache, body ache  Objective  Blood pressure 124/82, pulse 66, [name] 5\' 6"  (1.676 m), weight 189 lb (85.7 kg), SpO2 99 %.   General: [name] apparent distress alert and oriented x3 mood and affect normal, dressed appropriately.  HEENT: Pupils equal, extraocular movements intact  Respiratory: Patient's speak in full sentences and does not appear short of breath  Cardiovascular: [name] lower extremity edema, non tender, [name] erythema  Neuro: Cranial nerves II through XII are intact, neurovascularly intact in all extremities with 2+ DTRs and 2+ pulses.  Gait normal with good balance and coordination.  MSK: Mild arthritic changes of multiple joints Bilateral ankle  shows the patient does have tenderness over the peroneal tendons.  [name] audible popping noted.  Tender over the peroneal tendons only.  [name] pain with the compression of the calf.  Foot exam is unremarkable.  Neck exam shows some mild loss of lordosis.  Tender to palpation negative Spurling's.  Tightness noted in the parascapular region.  Osteopathic findings  C2 flexed rotated and side bent right C7 flexed rotated and side bent left T3 extended rotated and side bent right inhaled rib T9 extended rotated and side bent left L5 flexed rotated and side bent left Sacrum right on right       Assessment and Plan:   Subluxation of peroneal tendon of right foot Recurrent subluxation of the peroneal tendons.  Likely secondary to the lower extremity edema.  Home exercises given again today.  Discussed heel lift that I think will be beneficial.  Muscle relaxer might help.  Icing regimen.  Follow-up again in 6 to 8 weeks  Cervical disc disorder with radiculopathy of cervical region Stable overall.  Very mild tightness  noted.  Is not having any significant trouble at the moment.  We discussed which activities to do which wants to avoid.  Continue to work on Engineer, building services.  Follow-up again 6 to 8 weeks    Nonallopathic problems  Decision today to treat with OMT was based on Physical Exam  After verbal consent patient was treated with HVLA, ME, FPR techniques in cervical, rib, thoracic, lumbar, and sacral  areas  Patient tolerated the procedure well with improvement in symptoms  Patient given exercises, stretches and lifestyle modifications  See medications in patient instructions if given  Patient will follow up in 4-8 weeks      The above documentation has been reviewed and is accurate and complete Lyndal Pulley, DO       Note: This dictation was prepared with Dragon dictation along with smaller phrase technology. Any transcriptional errors that result from this process are  unintentional.

## 2020-07-17 NOTE — Assessment & Plan Note (Signed)
Stable overall.  Very mild tightness noted.  Is not having any significant trouble at the moment.  We discussed which activities to do which wants to avoid.  Continue to work on Engineer, building services.  Follow-up again 6 to 8 weeks

## 2020-07-17 NOTE — Assessment & Plan Note (Signed)
Recurrent subluxation of the peroneal tendons.  Likely secondary to the lower extremity edema.  Home exercises given again today.  Discussed heel lift that I think will be beneficial.  Muscle relaxer might help.  Icing regimen.  Follow-up again in 6 to 8 weeks

## 2020-07-17 NOTE — Patient Instructions (Signed)
Ankle exercises 1/16 inch heel lift in shoe See me again in 6-8 weeks

## 2020-07-19 ENCOUNTER — Encounter: Payer: Self-pay | Admitting: Endocrinology

## 2020-07-22 ENCOUNTER — Other Ambulatory Visit: Payer: Self-pay

## 2020-07-22 ENCOUNTER — Encounter: Payer: Self-pay | Admitting: Family Medicine

## 2020-07-22 ENCOUNTER — Ambulatory Visit
Admission: EM | Admit: 2020-07-22 | Discharge: 2020-07-22 | Discharge status: Against Medical Advice | Payer: Medicare Other

## 2020-07-22 DIAGNOSIS — N390 Urinary tract infection, site not specified: Secondary | ICD-10-CM

## 2020-07-23 DIAGNOSIS — F339 Major depressive disorder, recurrent, unspecified: Secondary | ICD-10-CM | POA: Diagnosis not present

## 2020-07-23 DIAGNOSIS — F410 Panic disorder [episodic paroxysmal anxiety] without agoraphobia: Secondary | ICD-10-CM | POA: Diagnosis not present

## 2020-07-24 ENCOUNTER — Ambulatory Visit (INDEPENDENT_AMBULATORY_CARE_PROVIDER_SITE_OTHER): Payer: Medicare Other | Admitting: Family

## 2020-07-24 ENCOUNTER — Other Ambulatory Visit: Payer: Self-pay

## 2020-07-24 ENCOUNTER — Encounter: Payer: Self-pay | Admitting: Family

## 2020-07-24 VITALS — BP 124/70 | HR 105 | Temp 98.3°F | Ht 66.0 in | Wt 187.0 lb

## 2020-07-24 DIAGNOSIS — R319 Hematuria, unspecified: Secondary | ICD-10-CM | POA: Diagnosis not present

## 2020-07-24 DIAGNOSIS — N390 Urinary tract infection, site not specified: Secondary | ICD-10-CM | POA: Diagnosis not present

## 2020-07-24 LAB — POC URINALSYSI DIPSTICK (AUTOMATED)
Bilirubin, UA: NEGATIVE
Blood, UA: POSITIVE
Glucose, UA: NEGATIVE
Ketones, UA: NEGATIVE
Nitrite, UA: POSITIVE
Protein, UA: POSITIVE — AB
Spec Grav, UA: 1.015 (ref 1.010–1.025)
Urobilinogen, UA: NEGATIVE E.U./dL — AB
pH, UA: 6 (ref 5.0–8.0)

## 2020-07-24 MED ORDER — SULFAMETHOXAZOLE-TRIMETHOPRIM 800-160 MG PO TABS
1.0000 | ORAL_TABLET | Freq: Two times a day (BID) | ORAL | 0 refills | Status: DC
Start: 2020-07-24 — End: 2020-07-26

## 2020-07-24 NOTE — Progress Notes (Signed)
Christine [name] is a 69 y.o. female with the following history as recorded in EpicCare:  Patient Active Problem List   Diagnosis Date Noted  . Leg swelling 07/12/2020  . Cervical disc disorder with radiculopathy of cervical region 01/25/2020  . Coronary atherosclerosis of native coronary artery 01/25/2020  . Nonallopathic lesion of sacral region 06/27/2018  . Dysuria 03/08/2018  . Subluxation of peroneal tendon of right foot 11/10/2017  . Lumbar radiculopathy, right 06/23/2017  . Closed displaced fracture of fifth metatarsal bone of left foot 03/16/2017  . GERD (gastroesophageal reflux disease) 12/25/2016  . Greater trochanteric bursitis, right 06/17/2016  . Diabetes ([place]) 04/02/2016  . Nonallopathic lesion of lumbosacral region 01/15/2016  . Routine general medical examination at a health care facility 11/25/2015  . Nonallopathic lesion of thoracic region 09/26/2014  . Nonallopathic lesion-rib cage 09/26/2014  . Nonallopathic lesion of cervical region 07/19/2014  . Hx of osteopenia 10/11/2012  . Seasonal allergic rhinitis 03/21/2012  . Fibromyalgia 08/12/2011  . Palpitations 04/15/2011  . DEPRESSIVE DISORDER NOT ELSEWHERE CLASSIFIED 03/27/2010  . Vitamin D deficiency 05/25/2008  . Lumbar radiculopathy 04/11/2008  . Essential hypertension 03/14/2008  . History of cardiovascular disorder 02/02/2008  . Hyperlipidemia associated with type 2 diabetes mellitus ([place]) 01/18/2007    Current Outpatient Medications  Medication Sig Dispense Refill  . ARIPiprazole (ABILIFY) 2 MG tablet Take 2 mg by mouth daily.    [name] aspirin 81 MG tablet Take 81 mg by mouth daily.      [name] atorvastatin (LIPITOR) 80 MG tablet Take 1 tablet (80 mg total) by mouth daily. 90 tablet 3  . busPIRone (BUSPAR) 15 MG tablet Take 15 mg by mouth 2 (two) times daily.    . cephALEXin (KEFLEX) 500 MG capsule Take 1 capsule (500 mg total) by mouth 2 (two) times daily. 10 capsule 0  . DULoxetine (CYMBALTA) 60 MG  capsule Take 60 mg by mouth daily.    . famotidine (PEPCID) 20 MG tablet Take 1 tablet (20 mg total) by mouth 2 (two) times daily. 14 tablet 0  . hydrOXYzine (ATARAX/VISTARIL) 25 MG tablet Take 1 tablet (25 mg total) by mouth every 6 (six) hours. 15 tablet 0  . Insulin Glargine (BASAGLAR KWIKPEN) 100 UNIT/ML Inject 0.28 mLs (28 Units total) into the skin every morning. 5 pen 3  . Insulin Pen Needle (BD PEN NEEDLE NANO U/F) 32G X 4 MM MISC USE ONCE A DAY AS DIRECTED 100 each 3  . Lancets (ONETOUCH ULTRASOFT) lancets Check blood sugar once daily. Dx code: 250.00 100 each 12  . LORazepam (ATIVAN) 0.5 MG tablet Take 0.5 mg by mouth 2 (two) times daily as needed. For anxiety.    [name] losartan (COZAAR) 100 MG tablet Take 1 tablet (100 mg total) by mouth daily. 90 tablet 3  . metoprolol tartrate (LOPRESSOR) 100 MG tablet Take one tablet by mouth 2 hours prior to your CT 1 tablet 0  . naproxen (NAPROSYN) 500 MG tablet TAKE 1 TABLET (500 MG TOTAL) BY MOUTH 2 (TWO) TIMES DAILY WITH A MEAL. 180 tablet 1  . omeprazole (PRILOSEC) 20 MG capsule Take 1 capsule (20 mg total) by mouth daily. 90 capsule 3  . ONETOUCH ULTRA test strip USE 1 STRIP TWICE DAILY DX CODE E11.65 100 strip 3  . pioglitazone (ACTOS) 15 MG tablet Take 1 tablet (15 mg total) by mouth daily. 90 tablet 3  . promethazine (PHENERGAN) 12.5 MG tablet Take 1 tablet (12.5 mg total) by mouth every 8 (eight)  hours as needed for nausea or vomiting. 20 tablet 8  . tiZANidine (ZANAFLEX) 2 MG tablet TAKE 1 TABLET BY MOUTH EVERY 8 HOURS AS NEEDED FOR MUSCLE SPASM 60 tablet 0  . traMADol (ULTRAM) 50 MG tablet TAKE 1 TABLET (50 MG TOTAL) BY MOUTH EVERY 6 (SIX) HOURS AS NEEDED. 20 tablet 0  . traZODone (DESYREL) 50 MG tablet TAKE 1/2 TO 1 TABLET BY MOUTH AT BEDTIME AS NEEDED FOR SLEEP 90 tablet 1  . Vitamin D, Ergocalciferol, (DRISDOL) 1.25 MG (50000 UNIT) CAPS capsule TAKE 1 CAPSULE BY MOUTH ONE TIME PER WEEK 12 capsule 0  . sulfamethoxazole-trimethoprim (BACTRIM  DS) 800-160 MG tablet Take 1 tablet by mouth 2 (two) times daily. 14 tablet 0   No current facility-administered medications for this visit.    Allergies: Vioxx [rofecoxib], Prednisone, Macrodantin [nitrofurantoin macrocrystal], and Colesevelam  Past Medical History:  Diagnosis Date  . Acute bursitis of right shoulder 08/29/2018   Right shoulder injected in August 29, 2018  . Allergic rhinitis   . Closed displaced fracture of fifth metatarsal bone of left foot 03/16/2017  . DEPRESSIVE DISORDER NOT ELSEWHERE CLASSIFIED 03/27/2010   Qualifier: Diagnosis of  By: [name] MD, [name]   [name], NP , [facility] [place] [place]    . Diabetes ([place]) 04/02/2016  . Diabetes mellitus   . Essential hypertension 03/14/2008   Qualifier: Diagnosis of  By: [name] MD, [name]    . Fibromyalgia 08/12/2011   Diagnosed by [name] , Rheumatologist 2010   . GERD (gastroesophageal reflux disease) 12/25/2016  . Gluteal tendonitis of right buttock 10/19/2017   Injected in October 19, 2017 Injected October 07, 2018  . Greater trochanteric bursitis of left hip 06/17/2016  . Greater trochanteric bursitis of right hip 11/27/2015   Injected 11/27/2015   . History of recurrent UTIs    [name]  . HTN (hypertension)   . Hx of osteopenia 10/11/2012   [facility]A; ordered by [name] Lowest T score: -2.1 @ lumbar spine @ [facility] 11/01/12; decrease of 8.5% vs 05/2009. There is 9.2% risk over 10 yrs History fracture left elbow @ 4 Fractured left wrist , right elbow and nose post fall July/18/2013. Dr. [name] No FH Osteoporosis No PMH of bisphosphonate therapy (generic Fosamax Rxed but not filled  due to polypharmacy )   . Hyperlipidemia   . Hyperlipidemia associated with type 2 diabetes mellitus ([place]) 01/18/2007   Qualifier: Diagnosis of  By: [name]  With DM LDL goal = < 100, ideally < 70. Mi in father @ 33; 2 brothers @ 33 & 9    . Lumbar radiculopathy 04/11/2008   Qualifier: Diagnosis of  By: [name]  MD, Erick Blinks well to epidural 01/12/2017  repeat epidural given May 2018  . Migraine headache    quiescent  . Nonallopathic lesion of cervical region 07/19/2014  . Nonallopathic lesion of lumbosacral region 01/15/2016  . Nonallopathic lesion of sacral region 06/27/2018  . Nonallopathic lesion-rib cage 09/26/2014  . Palpitations 04/15/2011  . Rotator cuff impingement syndrome of left shoulder 05/11/2014  . Seasonal allergic rhinitis 03/21/2012  . Sinusitis, chronic 04/20/2016  . Sleep disorder    [name] May  . Subluxation of peroneal tendon of right foot 11/10/2017  . TIA (transient ischemic attack)   . TRANSIENT ISCHEMIC ATTACKS, HX OF 02/02/2008   Qualifier: Diagnosis of  By: [name]    . UNS ADVRS EFF UNS RX MEDICINAL&BIOLOGICAL SBSTNC 02/02/2008   Qualifier:  Diagnosis of  By: [name] MD, [name]    . UNSPECIFIED MYALGIA AND MYOSITIS 02/02/2008   Qualifier: Diagnosis of  By: [name] MD, [name]    . UTI (urinary tract infection) 10/05/2014  . Viral upper respiratory tract infection with cough 12/31/2014  . Vitamin D deficiency 05/25/2008   Qualifier: Diagnosis of  By: [name] MD, [name]      Past Surgical History:  Procedure Laterality Date  . COLONOSCOPY     Dr Carlean [name]; hemorrhoids  . CORONARY ANGIOPLASTY  1997   for chest pain- negative   . POLYPECTOMY  2002   benign, hyperplastic polyp; rectal bleeding 2008  . TONSILLECTOMY    . TOTAL ABDOMINAL HYSTERECTOMY  1983   BSO for endometriosis  . WISDOM TOOTH EXTRACTION      Family History  Problem Relation Age of Onset  . Colon cancer Maternal Aunt   . Diabetes Paternal Uncle   . Heart attack Father 69  . COPD Mother   . Lung cancer Brother        smoker  . Mental illness Other        niece, committed suicide.  [name] Heart attack Other        brother X 2; @ 78 & 63  . Stroke Neg Hx     Social History   Tobacco Use  . Smoking status: Never Smoker  . Smokeless tobacco: Never Used  Substance Use Topics  . Alcohol  use: No    Subjective:  Patient presents with concerns for persistent UTI; feels like she has been dealing with the infection since the end of May; has been referred to urology; Symptoms re-flared over the weekend; no fever but has had chills; some lower back pain;  Most recently treated with Keflex- no culture was done;  Objective:  Vitals:   07/24/20 1000  BP: 124/70  Pulse: (!) 105  Temp: 98.3 F (36.8 C)  TempSrc: Oral  SpO2: 98%  Weight: 187 lb (84.8 kg)  Height: 5\' 6"  (1.676 m)    General: Well developed, well nourished, in no acute distress  Skin : Warm and dry.  Head: Normocephalic and atraumatic  Lungs: Respirations unlabored; clear to auscultation bilaterally without wheeze, rales, rhonchi  CVS exam: normal rate and regular rhythm.  Neurologic: Alert and oriented; speech intact; face symmetrical; moves all extremities well; CNII-XII intact without focal deficit   Assessment:  1. Urinary tract infection with hematuria, site unspecified     Plan:  Chronic/ persistent; update urine culture; Rx for Bactrim DS bid x 7 days; follow-up to be determined;  This visit occurred during the SARS-CoV-2 public health emergency.  Safety protocols were in place, including screening questions prior to the visit, additional usage of staff PPE, and extensive cleaning of exam room while observing appropriate contact time as indicated for disinfecting solutions.     No follow-ups on file.  Orders Placed This Encounter  Procedures  . Urine Culture    Standing Status:   Future    Number of Occurrences:   1    Standing Expiration Date:   07/24/2021    Requested Prescriptions   Signed Prescriptions Disp Refills  . sulfamethoxazole-trimethoprim (BACTRIM DS) 800-160 MG tablet 14 tablet 0    Sig: Take 1 tablet by mouth 2 (two) times daily.

## 2020-07-24 NOTE — Addendum Note (Signed)
Addended by: Marcina Millard on: 07/24/2020 02:30 PM   Modules accepted: Orders

## 2020-07-25 ENCOUNTER — Encounter: Payer: Self-pay | Admitting: Family

## 2020-07-26 ENCOUNTER — Other Ambulatory Visit: Payer: Self-pay | Admitting: Endocrinology

## 2020-07-26 ENCOUNTER — Encounter: Payer: Self-pay | Admitting: Family

## 2020-07-26 ENCOUNTER — Other Ambulatory Visit: Payer: Self-pay | Admitting: Family

## 2020-07-26 DIAGNOSIS — Z794 Long term (current) use of insulin: Secondary | ICD-10-CM

## 2020-07-26 DIAGNOSIS — E1151 Type 2 diabetes mellitus with diabetic peripheral angiopathy without gangrene: Secondary | ICD-10-CM

## 2020-07-26 MED ORDER — CEPHALEXIN 500 MG PO CAPS: 500.0000 mg | ORAL_CAPSULE | Freq: Two times a day (BID) | ORAL | 0 refills | Status: DC

## 2020-07-26 MED ORDER — PREDNISONE 20 MG PO TABS
20.0000 mg | ORAL_TABLET | Freq: Every day | ORAL | 0 refills | Status: DC
Start: 2020-07-26 — End: 2021-01-27

## 2020-07-27 LAB — URINE CULTURE

## 2020-07-28 ENCOUNTER — Encounter: Payer: Self-pay | Admitting: Endocrinology

## 2020-07-29 ENCOUNTER — Other Ambulatory Visit: Payer: Self-pay | Admitting: Family

## 2020-07-29 DIAGNOSIS — R300 Dysuria: Secondary | ICD-10-CM

## 2020-08-02 ENCOUNTER — Other Ambulatory Visit: Payer: Medicare Other

## 2020-08-02 DIAGNOSIS — R300 Dysuria: Secondary | ICD-10-CM | POA: Diagnosis not present

## 2020-08-03 LAB — URINE CULTURE
MICRO NUMBER:: 10852988
SPECIMEN QUALITY:: ADEQUATE

## 2020-08-05 ENCOUNTER — Other Ambulatory Visit: Payer: Self-pay

## 2020-08-05 ENCOUNTER — Encounter: Payer: Self-pay | Admitting: Endocrinology

## 2020-08-05 ENCOUNTER — Ambulatory Visit (INDEPENDENT_AMBULATORY_CARE_PROVIDER_SITE_OTHER): Payer: Medicare Other | Admitting: Endocrinology

## 2020-08-05 ENCOUNTER — Encounter: Payer: Self-pay | Admitting: Family

## 2020-08-05 VITALS — BP 150/80 | HR 108 | Ht 66.0 in | Wt 188.0 lb

## 2020-08-05 DIAGNOSIS — E1151 Type 2 diabetes mellitus with diabetic peripheral angiopathy without gangrene: Secondary | ICD-10-CM | POA: Diagnosis not present

## 2020-08-05 DIAGNOSIS — Z794 Long term (current) use of insulin: Secondary | ICD-10-CM

## 2020-08-05 DIAGNOSIS — I2510 Atherosclerotic heart disease of native coronary artery without angina pectoris: Secondary | ICD-10-CM | POA: Diagnosis not present

## 2020-08-05 LAB — POCT GLYCOSYLATED HEMOGLOBIN (HGB A1C): Hemoglobin A1C: 7.9 % — AB (ref 4.0–5.6)

## 2020-08-05 MED ORDER — BASAGLAR KWIKPEN 100 UNIT/ML ~~LOC~~ SOPN: 45.0000 [IU] | PEN_INJECTOR | SUBCUTANEOUS | 11 refills | Status: DC

## 2020-08-05 NOTE — Patient Instructions (Addendum)
Your blood pressure is high today.  Please see your primary care provider soon, to have it rechecked check your blood sugar twice a day.  vary the time of day when you check, between before the 3 meals, and at bedtime.  also check if you have symptoms of your blood sugar being too high or too low.  please keep a record of the readings and bring it to your next appointment here.  You can write it on any piece of paper.  please call us sooner if your blood sugar goes below 70, or if you have a lot of readings over 200.   Please increase the basaglar to 45 units each morning.   As the pioglitazone leaves your system, your insulin need will probably increase Please come back for a follow-up appointment in 2 months.

## 2020-08-05 NOTE — Progress Notes (Signed)
Subjective:    Patient ID: Christine [name], female    DOB: Feb 22, 1951, 69 y.o.   MRN: [id number]  HPI Pt returns for f/u of diabetes mellitus: DM type: Insulin-requiring type 2 Dx'ed: 6767 Complications: TIA Therapy: insulin since 2014, and pioglitizone.   GDM: never. DKA: never.  Severe hypoglycemia: never.  Pancreatitis: never.   Other: She declines multiple daily injections; pioglitizone is for NASH; a trial to convert back to oral rx in 2017 was unsuccessful; she declines additional DM rx.   Interval history: 1 week ago, she took prednisone for ADR to septra.  She stopped pioglitazone, due to edema.  He brings a record of his cbg's which I have reviewed today.  It varies from 108 315 Past Medical History:  Diagnosis Date   Acute bursitis of right shoulder 08/29/2018   Right shoulder injected in August 29, 2018   Allergic rhinitis    Closed displaced fracture of fifth metatarsal bone of left foot 03/16/2017   DEPRESSIVE DISORDER NOT ELSEWHERE CLASSIFIED 03/27/2010   Qualifier: Diagnosis of  By: [name] MD, [name]   [name], NP , [facility] [place]     Diabetes ([place]) 04/02/2016   Diabetes mellitus    Essential hypertension 03/14/2008   Qualifier: Diagnosis of  By: [name] MD, [name]     Fibromyalgia 08/12/2011   Diagnosed by [name] , Rheumatologist 2010    GERD (gastroesophageal reflux disease) 12/25/2016   Gluteal tendonitis of right buttock 10/19/2017   Injected in October 19, 2017 Injected October 07, 2018   Greater trochanteric bursitis of left hip 06/17/2016   Greater trochanteric bursitis of right hip 11/27/2015   Injected 11/27/2015    History of recurrent UTIs    [name]   HTN (hypertension)    Hx of osteopenia 10/11/2012   [facility]EXA; ordered by [name] Lowest T score: -2.1 @ lumbar spine @ [facility] 11/01/12; decrease of 8.5% vs 05/2009. There is 9.2% risk over 10 yrs History fracture left elbow @ 4 Fractured left wrist  , right elbow and nose post fall July/18/2013. Dr. [name] No FH Osteoporosis No PMH of bisphosphonate therapy (generic Fosamax Rxed but not filled  due to polypharmacy )    Hyperlipidemia    Hyperlipidemia associated with type 2 diabetes mellitus ([place]) 01/18/2007   Qualifier: Diagnosis of  By: [name]  With DM LDL goal = < 100, ideally < 70. Mi in father @ 8; 2 brothers @ 67 & 66     Lumbar radiculopathy 04/11/2008   Qualifier: Diagnosis of  By: [name] MD, Erick Blinks well to epidural 01/12/2017  repeat epidural given May 2018   Migraine headache    quiescent   Nonallopathic lesion of cervical region 07/19/2014   Nonallopathic lesion of lumbosacral region 01/15/2016   Nonallopathic lesion of sacral region 06/27/2018   Nonallopathic lesion-rib cage 09/26/2014   Palpitations 04/15/2011   Rotator cuff impingement syndrome of left shoulder 05/11/2014   Seasonal allergic rhinitis 03/21/2012   Sinusitis, chronic 04/20/2016   Sleep disorder    Dr Dohmier   Subluxation of peroneal tendon of right foot 11/10/2017   TIA (transient ischemic attack)    [place], HX OF 02/02/2008   Qualifier: Diagnosis of  By: Donalee Citrin ADVRS EFF UNS RX MEDICINAL&BIOLOGICAL [place] 02/02/2008   Qualifier: Diagnosis of  By: [name] MD, Posey Pronto AND MYOSITIS 02/02/2008   Qualifier:  Diagnosis of  By: [name] MD, [name]     UTI (urinary tract infection) 10/05/2014   Viral upper respiratory tract infection with cough 12/31/2014   Vitamin D deficiency 05/25/2008   Qualifier: Diagnosis of  By: [name] MD, [name]      Past Surgical History:  Procedure Laterality Date   COLONOSCOPY     Dr Carlean [name]; hemorrhoids   CORONARY ANGIOPLASTY  1997   for chest pain- negative    POLYPECTOMY  2002   benign, hyperplastic polyp; rectal bleeding 2008   TONSILLECTOMY     TOTAL ABDOMINAL HYSTERECTOMY  1983   BSO for endometriosis   WISDOM TOOTH  EXTRACTION      Social History   Socioeconomic History   Marital status: Married    Spouse name: Not on file   Number of children: Not on file   Years of education: Not on file   Highest education level: Not on file  Occupational History   Occupation: Research scientist (physical sciences)     Employer: [employer]  Tobacco Use   Smoking status: Never Smoker   Smokeless tobacco: Never Used  Vaping Use   Vaping Use: Never used  Substance and Sexual Activity   Alcohol use: No   Drug use: No   Sexual activity: Not on file  Other Topics Concern   Not on file  Social History Narrative   Regular exercise- no    Social Determinants of Health   Financial Resource Strain:    Difficulty of Paying Living Expenses: Not on file  Food Insecurity:    Worried About Charity fundraiser in the Last Year: Not on file   [employer] of Food in the Last Year: Not on file  Transportation Needs:    Lack of Transportation (Medical): Not on file   Lack of Transportation (Non-Medical): Not on file  Physical Activity:    Days of Exercise per Week: Not on file   Minutes of Exercise per Session: Not on file  Stress:    Feeling of Stress : Not on file  Social Connections:    Frequency of Communication with Friends and Family: Not on file   Frequency of Social Gatherings with Friends and Family: Not on file   Attends Religious Services: Not on file   Active Member of Clubs or Organizations: Not on file   Attends Archivist Meetings: Not on file   Marital Status: Not on file  Intimate Partner Violence:    Fear of Current or Ex-Partner: Not on file   Emotionally Abused: Not on file   Physically Abused: Not on file   Sexually Abused: Not on file    Current Outpatient Medications on File Prior to Visit  Medication Sig Dispense Refill   ARIPiprazole (ABILIFY) 2 MG tablet Take 2 mg by mouth daily.     aspirin 81 MG tablet Take 81 mg by mouth daily.        atorvastatin (LIPITOR) 80 MG tablet Take 1 tablet (80 mg total) by mouth daily. 90 tablet 3   BD PEN NEEDLE NANO 2ND GEN 32G X 4 MM MISC USE ONCE A DAY AS DIRECTED (Patient taking differently: 1 each by Other route daily. E11.9) 90 each 3   busPIRone (BUSPAR) 15 MG tablet Take 15 mg by mouth 2 (two) times daily.     cephALEXin (KEFLEX) 500 MG capsule Take 1 capsule (500 mg total) by mouth 2 (two) times daily. 10 capsule 0   DULoxetine (CYMBALTA) 60 MG  capsule Take 60 mg by mouth daily.     famotidine (PEPCID) 20 MG tablet Take 1 tablet (20 mg total) by mouth 2 (two) times daily. 14 tablet 0   hydrOXYzine (ATARAX/VISTARIL) 25 MG tablet Take 1 tablet (25 mg total) by mouth every 6 (six) hours. 15 tablet 0   Lancets (ONETOUCH ULTRASOFT) lancets Check blood sugar once daily. Dx code: 250.00 100 each 12   LORazepam (ATIVAN) 0.5 MG tablet Take 0.5 mg by mouth 2 (two) times daily as needed. For anxiety.     losartan (COZAAR) 100 MG tablet Take 1 tablet (100 mg total) by mouth daily. 90 tablet 3   metoprolol tartrate (LOPRESSOR) 100 MG tablet Take one tablet by mouth 2 hours prior to your CT 1 tablet 0   naproxen (NAPROSYN) 500 MG tablet TAKE 1 TABLET (500 MG TOTAL) BY MOUTH 2 (TWO) TIMES DAILY WITH A MEAL. 180 tablet 1   omeprazole (PRILOSEC) 20 MG capsule Take 1 capsule (20 mg total) by mouth daily. 90 capsule 3   ONETOUCH ULTRA test strip USE 1 STRIP TWICE DAILY DX CODE E11.65 100 strip 3   predniSONE (DELTASONE) 20 MG tablet Take 1 tablet (20 mg total) by mouth daily with breakfast. 5 tablet 0   promethazine (PHENERGAN) 12.5 MG tablet Take 1 tablet (12.5 mg total) by mouth every 8 (eight) hours as needed for nausea or vomiting. 20 tablet 8   tiZANidine (ZANAFLEX) 2 MG tablet TAKE 1 TABLET BY MOUTH EVERY 8 HOURS AS NEEDED FOR MUSCLE SPASM 60 tablet 0   traMADol (ULTRAM) 50 MG tablet TAKE 1 TABLET (50 MG TOTAL) BY MOUTH EVERY 6 (SIX) HOURS AS NEEDED. 20 tablet 0   traZODone (DESYREL) 50  MG tablet TAKE 1/2 TO 1 TABLET BY MOUTH AT BEDTIME AS NEEDED FOR SLEEP 90 tablet 1   Vitamin D, Ergocalciferol, (DRISDOL) 1.25 MG (50000 UNIT) CAPS capsule TAKE 1 CAPSULE BY MOUTH ONE TIME PER WEEK 12 capsule 0   No current facility-administered medications on file prior to visit.    Allergies  Allergen Reactions   Vioxx [Rofecoxib] Other (See Comments)    Excess BP which caused TIA    Prednisone Other (See Comments)    Mental status changes with high-dose oral agent. Tolerates epidural steroid injections. No associated rash or fever   Augmentin [Amoxicillin-Pot Clavulanate]     Diarrhea   Macrodantin [Nitrofurantoin Macrocrystal] Hives   Sulfa Antibiotics     Hives   Colesevelam Other (See Comments)    Leg Pain    Family History  Problem Relation Age of Onset   Colon cancer Maternal Aunt    Diabetes Paternal Uncle    Heart attack Father 44   COPD Mother    Lung cancer Brother        smoker   Mental illness Other        niece, committed suicide.   Heart attack Other        brother X 2; @ 79 & 52   Stroke Neg Hx     BP (!) 150/80    Pulse (!) 108    Ht 5\' 6"  (1.676 m)    Wt 188 lb (85.3 kg)    LMP  (LMP Unknown)    SpO2 96%    BMI 30.34 kg/m   Review of Systems She denies hypoglycemia.      Objective:   Physical Exam VITAL SIGNS:  See vs page GENERAL: no distress Pulses: dorsalis pedis intact bilat.  MSK: no deformity of the feet CV: no leg edema Skin:  no ulcer on the feet.  normal color and temp on the feet. Neuro: sensation is intact to touch on the feet.     Lab Results  Component Value Date   HGBA1C 7.9 (A) 08/05/2020   Lab Results  Component Value Date   TSH 2.23 01/05/2020      Assessment & Plan:  Insulin-requiring type 2 DM, with TIA: worse.  Rash, due to septra. Prednisone is affecting this A1c.   Patient Instructions  Your blood pressure is high today.  Please see your primary care provider soon, to have it rechecked check  your blood sugar twice a day.  vary the time of day when you check, between before the 3 meals, and at bedtime.  also check if you have symptoms of your blood sugar being too high or too low.  please keep a record of the readings and bring it to your next appointment here.  You can write it on any piece of paper.  please call us sooner if your blood sugar goes below 70, or if you have a lot of readings over 200.   Please increase the basaglar to 45 units each morning.   As the pioglitazone leaves your system, your insulin need will probably increase Please come back for a follow-up appointment in 2 months.

## 2020-08-06 ENCOUNTER — Encounter: Payer: Self-pay | Admitting: Family

## 2020-08-06 ENCOUNTER — Other Ambulatory Visit: Payer: Self-pay | Admitting: Family

## 2020-08-06 MED ORDER — FLUCONAZOLE 150 MG PO TABS
ORAL_TABLET | ORAL | 0 refills | Status: DC
Start: 2020-08-06 — End: 2021-01-27

## 2020-08-14 DIAGNOSIS — N3000 Acute cystitis without hematuria: Secondary | ICD-10-CM | POA: Diagnosis not present

## 2020-08-15 ENCOUNTER — Encounter: Payer: Self-pay | Admitting: Endocrinology

## 2020-08-15 ENCOUNTER — Other Ambulatory Visit: Payer: Self-pay | Admitting: Endocrinology

## 2020-08-15 DIAGNOSIS — Z794 Long term (current) use of insulin: Secondary | ICD-10-CM

## 2020-08-15 DIAGNOSIS — E1151 Type 2 diabetes mellitus with diabetic peripheral angiopathy without gangrene: Secondary | ICD-10-CM

## 2020-08-15 MED ORDER — BASAGLAR KWIKPEN 100 UNIT/ML ~~LOC~~ SOPN: 55.0000 [IU] | PEN_INJECTOR | SUBCUTANEOUS | 11 refills | Status: DC

## 2020-08-28 ENCOUNTER — Ambulatory Visit: Payer: Medicare Other | Admitting: Urology

## 2020-09-02 ENCOUNTER — Ambulatory Visit: Payer: Medicare Other | Admitting: Family Medicine

## 2020-09-03 ENCOUNTER — Telehealth: Payer: Self-pay | Admitting: Internal Medicine

## 2020-09-03 ENCOUNTER — Telehealth: Payer: Self-pay | Admitting: Pharmacist

## 2020-09-03 NOTE — Progress Notes (Signed)
Chronic Care Management Pharmacy Assistant   Name: Joshlynn Alfonzo  MRN: [id number] DOB: 01-Mar-1951  Reason for Encounter: Initial Questions Call    Brid[place],  69 y.o. , female presents for their Initial CCM visit with the clinical pharmacist via telephone.  PCP : [name], MD  Allergies:   Allergies  Allergen Reactions  . Vioxx [Rofecoxib] Other (See Comments)    Excess BP which caused TIA   . Prednisone Other (See Comments)    Mental status changes with high-dose oral agent. Tolerates epidural steroid injections. No associated rash or fever  . Augmentin [Amoxicillin-Pot Clavulanate]     Diarrhea  . Macrodantin [Nitrofurantoin Macrocrystal] Hives  . Sulfa Antibiotics     Hives  . Colesevelam Other (See Comments)    Leg Pain    Medications: Outpatient Encounter Medications as of 09/03/2020  Medication Sig  . ARIPiprazole (ABILIFY) 2 MG tablet Take 2 mg by mouth daily.  [name] aspirin 81 MG tablet Take 81 mg by mouth daily.    [name] atorvastatin (LIPITOR) 80 MG tablet Take 1 tablet (80 mg total) by mouth daily.  . BD PEN NEEDLE NANO 2ND GEN 32G X 4 MM MISC USE ONCE A DAY AS DIRECTED (Patient taking differently: 1 each by Other route daily. E11.9)  . busPIRone (BUSPAR) 15 MG tablet Take 15 mg by mouth 2 (two) times daily.  . cephALEXin (KEFLEX) 500 MG capsule Take 1 capsule (500 mg total) by mouth 2 (two) times daily.  . DULoxetine (CYMBALTA) 60 MG capsule Take 60 mg by mouth daily.  . famotidine (PEPCID) 20 MG tablet Take 1 tablet (20 mg total) by mouth 2 (two) times daily.  . fluconazole (DIFLUCAN) 150 MG tablet Take 1 tablet today as directed; repeat in 72 hours  . hydrOXYzine (ATARAX/VISTARIL) 25 MG tablet Take 1 tablet (25 mg total) by mouth every 6 (six) hours.  . Insulin Glargine (BASAGLAR KWIKPEN) 100 UNIT/ML Inject 0.55 mLs (55 Units total) into the skin every morning.  . Lancets (ONETOUCH ULTRASOFT) lancets Check blood sugar once daily.  Dx code: 250.00  . LORazepam (ATIVAN) 0.5 MG tablet Take 0.5 mg by mouth 2 (two) times daily as needed. For anxiety.  [name] losartan (COZAAR) 100 MG tablet Take 1 tablet (100 mg total) by mouth daily.  . metoprolol tartrate (LOPRESSOR) 100 MG tablet Take one tablet by mouth 2 hours prior to your CT  . naproxen (NAPROSYN) 500 MG tablet TAKE 1 TABLET (500 MG TOTAL) BY MOUTH 2 (TWO) TIMES DAILY WITH A MEAL.  [name] omeprazole (PRILOSEC) 20 MG capsule Take 1 capsule (20 mg total) by mouth daily.  Glory Rosebush ULTRA test strip USE 1 STRIP TWICE DAILY DX CODE E11.65  . predniSONE (DELTASONE) 20 MG tablet Take 1 tablet (20 mg total) by mouth daily with breakfast.  . promethazine (PHENERGAN) 12.5 MG tablet Take 1 tablet (12.5 mg total) by mouth every 8 (eight) hours as needed for nausea or vomiting.  [name] tiZANidine (ZANAFLEX) 2 MG tablet TAKE 1 TABLET BY MOUTH EVERY 8 HOURS AS NEEDED FOR MUSCLE SPASM  . traMADol (ULTRAM) 50 MG tablet TAKE 1 TABLET (50 MG TOTAL) BY MOUTH EVERY 6 (SIX) HOURS AS NEEDED.  [name] traZODone (DESYREL) 50 MG tablet TAKE 1/2 TO 1 TABLET BY MOUTH AT BEDTIME AS NEEDED FOR SLEEP  . Vitamin D, Ergocalciferol, (DRISDOL) 1.25 MG (50000 UNIT) CAPS capsule TAKE 1 CAPSULE BY MOUTH ONE TIME PER WEEK   No facility-administered encounter medications  on file as of 09/03/2020.    Current Diagnosis: Patient Active Problem List   Diagnosis Date Noted  . Leg swelling 07/12/2020  . Cervical disc disorder with radiculopathy of cervical region 01/25/2020  . Coronary atherosclerosis of native coronary artery 01/25/2020  . Nonallopathic lesion of sacral region 06/27/2018  . Dysuria 03/08/2018  . Subluxation of peroneal tendon of right foot 11/10/2017  . Lumbar radiculopathy, right 06/23/2017  . Closed displaced fracture of fifth metatarsal bone of left foot 03/16/2017  . GERD (gastroesophageal reflux disease) 12/25/2016  . Greater trochanteric bursitis, right 06/17/2016  . Diabetes ([place]) 04/02/2016  .  Nonallopathic lesion of lumbosacral region 01/15/2016  . Routine general medical examination at a health care facility 11/25/2015  . Nonallopathic lesion of thoracic region 09/26/2014  . Nonallopathic lesion-rib cage 09/26/2014  . Nonallopathic lesion of cervical region 07/19/2014  . Hx of osteopenia 10/11/2012  . Seasonal allergic rhinitis 03/21/2012  . Fibromyalgia 08/12/2011  . Palpitations 04/15/2011  . DEPRESSIVE DISORDER NOT ELSEWHERE CLASSIFIED 03/27/2010  . Vitamin D deficiency 05/25/2008  . Lumbar radiculopathy 04/11/2008  . Essential hypertension 03/14/2008  . History of cardiovascular disorder 02/02/2008  . Hyperlipidemia associated with type 2 diabetes mellitus ([place]) 01/18/2007    Goals Addressed   None     Follow-Up:  Pharmacist Review   Have you seen any other providers since your last visit? yes Any changes in your medications or health? Yes The patient 55 units of insulin every morning.  Any side effects from any medications? no Do you have an symptoms or problems not managed by your medications? no Any concerns about your health right now? Yes, The patient has been having trouble with UTI ongoing for the past 5 months. Has your provider asked that you check blood pressure, blood sugar, or follow special diet at home? Yes, The patient checked her Blood sugar  On 9/21 -126 and today 9/22- 110. Yes the patient checks her blood pressure weekly. Can you think of a goal you would like to reach for your health? She would like to discuss the increase in her insulin.   Do you have any problems getting your medications? Yes, The patient stated that she has been having issues with her medications being on auto fill and the pharmacy filling medications that she does not need.  Is there anything that you would like to discuss during the appointment? Yes she would like to discuss and possible side effects that the increase in insulin may cause.   Please bring medications and  supplements to appointment     Rosendo Gros, [place] Pharmacist Assistant  [phone]

## 2020-09-04 ENCOUNTER — Ambulatory Visit: Payer: Medicare Other | Admitting: Pharmacist

## 2020-09-04 ENCOUNTER — Other Ambulatory Visit: Payer: Self-pay

## 2020-09-04 DIAGNOSIS — Z794 Long term (current) use of insulin: Secondary | ICD-10-CM

## 2020-09-04 DIAGNOSIS — E1169 Type 2 diabetes mellitus with other specified complication: Secondary | ICD-10-CM

## 2020-09-04 DIAGNOSIS — I10 Essential (primary) hypertension: Secondary | ICD-10-CM

## 2020-09-04 NOTE — Telephone Encounter (Signed)
Error

## 2020-09-04 NOTE — Chronic Care Management (AMB) (Signed)
Chronic Care Management Pharmacy  Name: Christine [name]  MRN: [id number] DOB: 09/20/1951   Chief Complaint/ HPI  Christine [name],  69 y.o. , female presents for their Initial CCM visit with the clinical pharmacist via telephone due to COVID-19 Pandemic.  PCP : [name], MD Patient Care Team: [name], MD as PCP - General (Internal Medicine) Belva Crome, MD as PCP - Cardiology (Cardiology) Pieter Partridge, DO as Consulting Physician (Neurology) Charlton Haws, [facility] as Pharmacist (Pharmacist)  Their chronic conditions include: Hypertension, Hyperlipidemia, Diabetes, Coronary Artery Disease, GERD, Depression, Osteopenia, Allergic Rhinitis and Back pain, Fibromyalgia   Pt has lived in [place] most of her life, did live in [place] for 2 years with husband. She lost a son at 19 years old in 2012, another son lives in [place], with 78 year old grandchildren. Husband has retired from [employer]. They are involved with the church. Her best friend's husband passed away recently and it has been. Patient reports the biggest issue is her blood sugar right now.  July 26 - feet started swelling severely, stopped Actos. Pt has had 5 UTIs since April.  On Actos - pt was on 28 units of insulin. Off Actos - up to 55 units.   Office Visits: 07/24/20 NP Jodi Mourning: UTI, tx with Bactrim. PT developed hives, switched to Keflex.  07/12/20 Dr Sharlet Salina OV: stopped Actos due to leg swelling. Rx'd Keflex for UTI.  05/31/20 Dr Sharlet Salina OV: muscle aches after increasing atorvastatin 40 to 80 mg. Reduced statin back to 40 mg. Rx'd Macrobid for UTI.  04/23/20 Dr Sharlet Salina OV: chronic f/u  Consult Visit: 08/05/20 Dr Loanne Drilling (endocrine): increased Basaglar to 55 units. A1c up to 7.9, on prednisone course, UTIs.  07/17/20 Dr Tamala Julian (sports med): no med changes, pt doing well at the moment.  06/09/20 ED visit: rash, suspected allergic reaction to macrobid. 06/08/20 ED visit:  rash/urticaria, may be due to Kenalog/Marcaine injection. Improved with IV Benadryl, IV Pepcid.  Allergies  Allergen Reactions   Vioxx [Rofecoxib] Other (See Comments)    Excess BP which caused TIA    Prednisone Other (See Comments)    Mental status changes with high-dose oral agent. Tolerates epidural steroid injections. No associated rash or fever   Augmentin [Amoxicillin-Pot Clavulanate]     Diarrhea   Macrodantin [Nitrofurantoin Macrocrystal] Hives   Sulfa Antibiotics     Hives   Colesevelam Other (See Comments)    Leg Pain    Medications: Outpatient Encounter Medications as of 09/04/2020  Medication Sig Note   aspirin 81 MG tablet Take 81 mg by mouth daily.      atorvastatin (LIPITOR) 80 MG tablet Take 1 tablet (80 mg total) by mouth daily. (Patient taking differently: Take 40 mg by mouth daily. ) 09/04/2020: Dose reduced back to 40 mg due to muscle aches   BD PEN NEEDLE NANO 2ND GEN 32G X 4 MM MISC USE ONCE A DAY AS DIRECTED (Patient taking differently: 1 each by Other route daily. E11.9)    busPIRone (BUSPAR) 15 MG tablet Take 15 mg by mouth 2 (two) times daily.    cephALEXin (KEFLEX) 500 MG capsule Take 1 capsule (500 mg total) by mouth 2 (two) times daily.    DULoxetine (CYMBALTA) 60 MG capsule Take 60 mg by mouth daily.    fluconazole (DIFLUCAN) 150 MG tablet Take 1 tablet today as directed; repeat in 72 hours    Insulin Glargine (BASAGLAR KWIKPEN) 100 UNIT/ML  Inject 0.55 mLs (55 Units total) into the skin every morning.    Lancets (ONETOUCH ULTRASOFT) lancets Check blood sugar once daily. Dx code: 250.00    LORazepam (ATIVAN) 0.5 MG tablet Take 0.5 mg by mouth 2 (two) times daily as needed. For anxiety.    losartan (COZAAR) 100 MG tablet Take 1 tablet (100 mg total) by mouth daily.    naproxen (NAPROSYN) 500 MG tablet TAKE 1 TABLET (500 MG TOTAL) BY MOUTH 2 (TWO) TIMES DAILY WITH A MEAL.    omeprazole (PRILOSEC) 20 MG capsule Take 1 capsule (20 mg total)  by mouth daily.    ONETOUCH ULTRA test strip USE 1 STRIP TWICE DAILY DX CODE E11.65    promethazine (PHENERGAN) 12.5 MG tablet Take 1 tablet (12.5 mg total) by mouth every 8 (eight) hours as needed for nausea or vomiting.    tiZANidine (ZANAFLEX) 2 MG tablet TAKE 1 TABLET BY MOUTH EVERY 8 HOURS AS NEEDED FOR MUSCLE SPASM    traMADol (ULTRAM) 50 MG tablet TAKE 1 TABLET (50 MG TOTAL) BY MOUTH EVERY 6 (SIX) HOURS AS NEEDED.    traZODone (DESYREL) 50 MG tablet TAKE 1/2 TO 1 TABLET BY MOUTH AT BEDTIME AS NEEDED FOR SLEEP    Vitamin D, Ergocalciferol, (DRISDOL) 1.25 MG (50000 UNIT) CAPS capsule TAKE 1 CAPSULE BY MOUTH ONE TIME PER WEEK    ARIPiprazole (ABILIFY) 2 MG tablet Take 2 mg by mouth daily. (Patient not taking: Reported on 09/04/2020)    famotidine (PEPCID) 20 MG tablet Take 1 tablet (20 mg total) by mouth 2 (two) times daily. (Patient not taking: Reported on 09/04/2020)    hydrOXYzine (ATARAX/VISTARIL) 25 MG tablet Take 1 tablet (25 mg total) by mouth every 6 (six) hours. (Patient not taking: Reported on 09/04/2020)    metoprolol tartrate (LOPRESSOR) 100 MG tablet Take one tablet by mouth 2 hours prior to your CT (Patient not taking: Reported on 09/04/2020)    predniSONE (DELTASONE) 20 MG tablet Take 1 tablet (20 mg total) by mouth daily with breakfast. (Patient not taking: Reported on 09/04/2020)    No facility-administered encounter medications on file as of 09/04/2020.    Wt Readings from Last 3 Encounters:  08/05/20 188 lb (85.3 kg)  07/24/20 187 lb (84.8 kg)  07/17/20 189 lb (85.7 kg)    Current Diagnosis/Assessment:  SDOH Interventions     Most Recent Value  SDOH Interventions  Financial Strain Interventions Other (Comment)  [Ozempic PAP paperwork provided]      Goals Addressed            This Visit's Progress    Pharmacy Care Plan       CARE PLAN ENTRY  Current Barriers:   Chronic Disease Management support, education, and care coordination needs related to  Hypertension, Hyperlipidemia, and Diabetes   Hypertension BP Readings from Last 3 Encounters:  08/05/20 (!) 150/80  07/24/20 124/70  07/17/20 124/82   Pharmacist Clinical Goal(s): o Over the next 60 days, patient will work with PharmD and providers to maintain BP goal <130/80  Current regimen:  o Losartan 100 mg daily  Interventions: o Discussed BP goals and benefits of medications for prevention of heart attack / stroke o Discussed white coat syndrome and importance of checking BP at home  Patient self care activities - Over the next 60 days, patient will: o Check BP 1-2 times weekly, document, and provide at future appointments o Ensure daily salt intake < 2300 mg/day  Hyperlipidemia Lab Results  Component Value  Date/Time   LDLCALC 118 (H) 04/23/2020 01:31 PM   Pharmacist Clinical Goal(s): o Over the next 60 days, patient will work with PharmD and providers to achieve LDL goal < 100  Current regimen:  o Atorvastatin 80 mg daily o Aspirin 81 mg daily  Interventions: o Discussed cholesterol goals and benefits of medications for prevention of heart attack / stroke o Discussed foods high in cholesterol to avoid  Patient self care activities - Over the next 60 days, patient will: o Continue current medications o Reduce cholesterol in diet  Diabetes Lab Results  Component Value Date/Time   HGBA1C 7.9 (A) 08/05/2020 03:13 PM   HGBA1C 7.0 (A) 06/04/2020 11:09 AM   HGBA1C 6.6 (H) 05/24/2015 11:37 AM   HGBA1C 8.3 (H) 02/22/2015 11:24 AM   Pharmacist Clinical Goal(s): o Over the next 60 days, patient will work with PharmD and providers to achieve A1c goal <7%  Current regimen:  o Basaglar 30 units daily  Interventions: o Discussed blood sugar goals and benefits of medications for prevention of heart attack / stroke o Discussed benefits of Ozempic including insulin sparing, weight loss, and cardiac protection. Also discussed patient assistance opportunity  Patient  self care activities - Over the next 60 days, patient will: o Check blood sugar once daily, document, and provide at future appointments o Contact provider with any episodes of hypoglycemia o Discuss Ozempic with Dr Loanne Drilling at follow up appointment. Bring patient assistance form to appointment.  Medication management  Pharmacist Clinical Goal(s): o Over the next 60 days, patient will work with PharmD and providers to maintain optimal medication adherence  [facility]  Interventions o Comprehensive medication review performed. o Continue current medication management strategy  Patient self care activities - Over the next 60 days, patient will: o Focus on medication adherence by fill date o Take medications as prescribed o Report any questions or concerns to PharmD and/or provider(s)  Initial goal documentation       Hypertension   BP goal is:  <130/80  Office blood pressures are  BP Readings from Last 3 Encounters:  08/05/20 (!) 150/80  07/24/20 124/70  07/17/20 124/82   Kidney Function Lab Results  Component Value Date/Time   CREATININE 0.66 07/12/2020 11:28 AM   CREATININE 0.81 06/09/2020 12:25 PM   CREATININE 0.64 05/31/2020 02:20 PM   CREATININE 0.62 03/13/2011 04:36 PM   GFR 91.48 05/31/2020 02:20 PM   GFRNONAA >60 06/09/2020 12:25 PM   GFRAA >60 06/09/2020 12:25 PM   K 3.9 07/12/2020 11:28 AM   K 3.6 06/09/2020 12:25 PM   Patient checks BP at home 1-2x per week Patient home BP readings are ranging: 120/70  Patient has failed these meds in the past: n/a Patient is currently controlled on the following medications:   Losartan 100 mg daily  We discussed diet and exercise extensively; white coat syndrome; benefits of monitoring BP at home  Plan  Continue current medications and control with diet and exercise     Hyperlipidemia   LDL goal < 70 Hx TIA  Lipid Panel     Component Value Date/Time   CHOL 194 04/23/2020 1331   TRIG 94.0  04/23/2020 1331   HDL 57.60 04/23/2020 1331   LDLCALC 118 (H) 04/23/2020 1331    Hepatic Function Latest Ref Rng & Units 07/12/2020 05/31/2020 04/23/2020  Total Protein 6.1 - 8.1 g/dL 6.1 6.9 6.9  Albumin 3.5 - 5.2 g/dL - 4.2 4.1  AST 10 - 35 U/L 19  18 17  ALT 6 - 29 U/L '20 27 22  ' Alk Phosphatase 39 - 117 U/L - 67 59  Total Bilirubin 0.2 - 1.2 mg/dL 0.4 0.4 0.4  Bilirubin, Direct 0.0 - 0.3 mg/dL - - -    The 10-year ASCVD risk score Mikey Bussing DC Jr., et al., 2013) is: 32.7%   Values used to calculate the score:     Age: 3 years     Sex: Female     Is Non-Hispanic African American: No     Diabetic: Yes     Tobacco smoker: No     Systolic Blood Pressure: 829 mmHg     Is BP treated: Yes     HDL Cholesterol: 57.6 mg/dL     Total Cholesterol: 194 mg/dL   Patient has failed these meds in past: atorvastatin 80 mg - myalgia Patient is currently uncontrolled on the following medications:   Atorvastatin 40 mg daily  Aspirin 81 mg daily   We discussed:  diet and exercise extensively; Cholesterol goals; benefits of statin for ASCVD risk reduction; in the future pt may benefit from switching to Crestor 20-40 mg due to more potent LDL reduction and less risk of myalgias; pt is currently working lifestyle modifications to reach LDL goal  Plan  Continue current medications and control with diet and exercise  Diabetes   A1c goal <7%  Dx 2006, insulin since 2014  Recent Relevant Labs: Lab Results  Component Value Date/Time   HGBA1C 7.9 (A) 08/05/2020 03:13 PM   HGBA1C 7.0 (A) 06/04/2020 11:09 AM   HGBA1C 6.6 (H) 05/24/2015 11:37 AM   HGBA1C 8.3 (H) 02/22/2015 11:24 AM   GFR 91.48 05/31/2020 02:20 PM   GFR 86.79 04/23/2020 01:31 PM   MICROALBUR 10 04/23/2020 03:47 PM   MICROALBUR 1.1 11/21/2014 10:40 AM   MICROALBUR 0.6 08/08/2014 12:07 PM    Last diabetic Eye exam:  Lab Results  Component Value Date/Time   HMDIABEYEEXA No Retinopathy 05/18/2018 10:01 AM    Last diabetic Foot  exam: No results found for: HMDIABFOOTEX   Checking BG: Daily  Recent FBG Readings: 120, 111, 86, 96  Patient has failed these meds in past: Janumet, metformin, pioglitazone, repaglinide Patient is currently uncontrolled on the following medications:  Basaglar 55 units daily  Testing supplies  We discussed: diet and exercise extensively and how to recognize and treat signs of hypoglycemia; pt has had a few low sugars < 70 (62, 69), she did not feel any symptoms, but treated with OJ and BG went back up to 140.   Pt is worried about ~30 unit insulin increase since stopping pioglitazone in July and wants to know about other medications; pt could not tolerate metformin previously due to GI effects (per chart metformin XR 500 mg was not tolerated); discussed benefits of Ozempic for weight loss, insulin sparing, and cardiac protection; pt is interested in trying this med and would like to discuss with her endocrinologist at f/u appt next month; also discussed PAP for Ozempic since it will likely be Tier 3 and may cause her to enter coverage gap earlier in the year.  Plan  Continue current medications and control with diet and exercise  Pt to discuss Ozempic with endocrinologist  Depression   Managed per NP [name]  Depression screen [facility] 2/9 04/23/2020 02/08/2019 05/21/2016  Decreased Interest 0 0 0  Down, Depressed, Hopeless 0 0 0  PHQ - 2 Score 0 0 0  Some recent data might be hidden  Patient has failed these meds in past: Pristiq, aripiprazole (never started) Patient is currently controlled on the following medications:   Duloxetine 60 mg daily  Buspirone 15 mg BID  Lorazepam 0.5 mg BID prn  Trazodone 50 mg 1/2 tab HS  We discussed:  Pt reports mood is stable and controlled currently; she uses lorazepam sparingly; she never started aripiprazole after reading about potential side effects; she reports she has worsening depression each year around Jan-Feb, and plans to start a  new medication with psych provider in the fall.  Plan  Continue current medications  Back pain   Lumbar radiculopathy Cervical disc disorder Fibromyalgia  Patient has failed these meds in past: n/a Patient is currently controlled on the following medications:   Naproxen 500 mg BID prn  Tizanidine 2 mg q8h PRN  Tramadol 50 mg q6h PRN  Duloxetine 60 mg daily  Promethazine 25 mg   We discussed:  Takes tizanidine, tramadol infrequently. Uses Naproxen first for pain. Denies issues currently  Plan  Continue current medications  GERD   Patient has failed these meds in past: n/a Patient is currently controlled on the following medications:   Omeprazole 20 mg daily  We discussed: Pt reports reflux is well controlled with daily PPI; she notes if she stops it for more than 4 days reflux returns  Plan  Continue current medications  Osteopenia / Osteoporosis   Last DEXA Scan: 01/08/2015   T-Score femoral neck: -2.0  T-Score total hip: -1.6  T-Score lumbar spine: -1.5  T-Score forearm radius: n/a  10-year probability of major osteoporotic fracture: 17.3%  10-year probability of hip fracture: 2.7%  VITD  Date Value Ref Range Status  04/23/2020 48.28 30.00 - 100.00 ng/mL Final    Patient is not a candidate for pharmacologic treatment  Patient has failed these meds in past: alendronate Patient is currently controlled on the following medications:   Vitamin D 50,000 IU weekly  We discussed:  Recommend [phone] units of vitamin D daily. Recommend 1200 mg of calcium daily from dietary and supplemental sources.  Plan  Continue current medications  Medication Management   [facility] Uses pill box? No - prefers bottles Pt endorses 100% compliance  We discussed: Discussed benefits of medication synchronization, packaging and delivery as well as enhanced pharmacist oversight with [employer]. Pt is somewhat interested but may be changing  her insurance plan and isn't sure which pharmacy will be preferred, currently [facility].  Plan  Continue current medication management strategy    Follow up: 2 month phone visit  Charlene Brooke, PharmD, BCACP Clinical Pharmacist [facility] [phone]

## 2020-09-04 NOTE — Patient Instructions (Addendum)
Visit Information  Phone number for Pharmacist: [phone]  Thank you for meeting with me to discuss your medications! I look forward to working with you to achieve your health care goals. Below is a summary of what we talked about during the visit:  Goals Addressed            This Visit's Progress   . Pharmacy Care Plan       CARE PLAN ENTRY  Current Barriers:  . Chronic Disease Management support, education, and care coordination needs related to Hypertension, Hyperlipidemia, and Diabetes   Hypertension BP Readings from Last 3 Encounters:  08/05/20 (!) 150/80  07/24/20 124/70  07/17/20 124/82 .  Pharmacist Clinical Goal(s): o Over the next 60 days, patient will work with PharmD and providers to maintain BP goal <130/80 . Current regimen:  o Losartan 100 mg daily . Interventions: o Discussed BP goals and benefits of medications for prevention of heart attack / stroke o Discussed white coat syndrome and importance of checking BP at home . Patient self care activities - Over the next 60 days, patient will: o Check BP 1-2 times weekly, document, and provide at future appointments o Ensure daily salt intake < 2300 mg/day  Hyperlipidemia Lab Results  Component Value Date/Time   LDLCALC 118 (H) 04/23/2020 01:31 PM .  Pharmacist Clinical Goal(s): o Over the next 60 days, patient will work with PharmD and providers to achieve LDL goal < 100 . Current regimen:  o Atorvastatin 80 mg daily o Aspirin 81 mg daily . Interventions: o Discussed cholesterol goals and benefits of medications for prevention of heart attack / stroke o Discussed foods high in cholesterol to avoid . Patient self care activities - Over the next 60 days, patient will: o Continue current medications o Reduce cholesterol in diet  Diabetes Lab Results  Component Value Date/Time   HGBA1C 7.9 (A) 08/05/2020 03:13 PM   HGBA1C 7.0 (A) 06/04/2020 11:09 AM   HGBA1C 6.6 (H) 05/24/2015 11:37 AM   HGBA1C 8.3  (H) 02/22/2015 11:24 AM .  Pharmacist Clinical Goal(s): o Over the next 60 days, patient will work with PharmD and providers to achieve A1c goal <7% . Current regimen:  o Basaglar 30 units daily . Interventions: o Discussed blood sugar goals and benefits of medications for prevention of heart attack / stroke o Discussed benefits of Ozempic including insulin sparing, weight loss, and cardiac protection. Also discussed patient assistance opportunity . Patient self care activities - Over the next 60 days, patient will: o Check blood sugar once daily, document, and provide at future appointments o Contact provider with any episodes of hypoglycemia o Discuss Ozempic with Dr Loanne Drilling at follow up appointment. Bring patient assistance form to appointment.  Medication management . Pharmacist Clinical Goal(s): o Over the next 60 days, patient will work with PharmD and providers to maintain optimal medication adherence . Current pharmacy: [facility] . Interventions o Comprehensive medication review performed. o Continue current medication management strategy . Patient self care activities - Over the next 60 days, patient will: o Focus on medication adherence by fill date o Take medications as prescribed o Report any questions or concerns to PharmD and/or provider(s)  Initial goal documentation      Ms. Sykora was given information about Chronic Care Management services today including:  1. CCM service includes personalized support from designated clinical staff supervised by her physician, including individualized plan of care and coordination with other care providers 2. 24/7 contact phone numbers for assistance  for urgent and routine care needs. 3. Standard insurance, coinsurance, copays and deductibles apply for chronic care management only during months in which we provide at least 20 minutes of these services. Most insurances cover these services at 100%, however patients may be responsible for  any copay, coinsurance and/or deductible if applicable. This service may help you avoid the need for more expensive face-to-face services. 4. Only one practitioner may furnish and bill the service in a calendar month. 5. The patient may stop CCM services at any time (effective at the end of the month) by phone call to the office staff.  Patient agreed to services and verbal consent obtained.   Patient verbalizes understanding of instructions provided today.  Telephone follow up appointment with pharmacy team member scheduled for: 6 months  Charlene Brooke, PharmD, BCACP Clinical Pharmacist [facility] [place] [place] [phone]  Diabetes Mellitus and Exercise Exercising regularly is important for your overall health, especially when you have diabetes (diabetes mellitus). Exercising is not only about losing weight. It has many other health benefits, such as increasing muscle strength and bone density and reducing body fat and stress. This leads to improved fitness, flexibility, and endurance, all of which result in better overall health. Exercise has additional benefits for people with diabetes, including:  Reducing appetite.  Helping to lower and control blood glucose.  Lowering blood pressure.  Helping to control amounts of fatty substances (lipids) in the blood, such as cholesterol and triglycerides.  Helping the body to respond better to insulin (improving insulin sensitivity).  Reducing how much insulin the body needs.  Decreasing the risk for heart disease by: ? Lowering cholesterol and triglyceride levels. ? Increasing the levels of good cholesterol. ? Lowering blood glucose levels. What is my activity plan? Your health care provider or certified diabetes educator can help you make a plan for the type and frequency of exercise (activity plan) that works for you. Make sure that you:  Do at least 150 minutes of moderate-intensity or vigorous-intensity exercise  each week. This could be brisk walking, biking, or water aerobics. ? Do stretching and strength exercises, such as yoga or weightlifting, at least 2 times a week. ? Spread out your activity over at least 3 days of the week.  Get some form of physical activity every day. ? Do not go more than 2 days in a row without some kind of physical activity. ? Avoid being inactive for more than 30 minutes at a time. Take frequent breaks to walk or stretch.  Choose a type of exercise or activity that you enjoy, and set realistic goals.  Start slowly, and gradually increase the intensity of your exercise over time. What do I need to know about managing my diabetes?   Check your blood glucose before and after exercising. ? If your blood glucose is 240 mg/dL (13.3 mmol/L) or higher before you exercise, check your urine for ketones. If you have ketones in your urine, do not exercise until your blood glucose returns to normal. ? If your blood glucose is 100 mg/dL (5.6 mmol/L) or lower, eat a snack containing 15-20 grams of carbohydrate. Check your blood glucose 15 minutes after the snack to make sure that your level is above 100 mg/dL (5.6 mmol/L) before you start your exercise.  Know the symptoms of low blood glucose (hypoglycemia) and how to treat it. Your risk for hypoglycemia increases during and after exercise. Common symptoms of hypoglycemia can include: ? Hunger. ? Anxiety. ? Sweating and  feeling clammy. ? Confusion. ? Dizziness or feeling light-headed. ? Increased heart rate or palpitations. ? Blurry vision. ? Tingling or numbness around the mouth, lips, or tongue. ? Tremors or shakes. ? Irritability.  Keep a rapid-acting carbohydrate snack available before, during, and after exercise to help prevent or treat hypoglycemia.  Avoid injecting insulin into areas of the body that are going to be exercised. For example, avoid injecting insulin into: ? The arms, when playing tennis. ? The legs, when  jogging.  Keep records of your exercise habits. Doing this can help you and your health care provider adjust your diabetes management plan as needed. Write down: ? Food that you eat before and after you exercise. ? Blood glucose levels before and after you exercise. ? The type and amount of exercise you have done. ? When your insulin is expected to peak, if you use insulin. Avoid exercising at times when your insulin is peaking.  When you start a new exercise or activity, work with your health care provider to make sure the activity is safe for you, and to adjust your insulin, medicines, or food intake as needed.  Drink plenty of water while you exercise to prevent dehydration or heat stroke. Drink enough fluid to keep your urine clear or pale yellow. Summary  Exercising regularly is important for your overall health, especially when you have diabetes (diabetes mellitus).  Exercising has many health benefits, such as increasing muscle strength and bone density and reducing body fat and stress.  Your health care provider or certified diabetes educator can help you make a plan for the type and frequency of exercise (activity plan) that works for you.  When you start a new exercise or activity, work with your health care provider to make sure the activity is safe for you, and to adjust your insulin, medicines, or food intake as needed. This information is not intended to replace advice given to you by your health care provider. Make sure you discuss any questions you have with your health care provider. Document Revised: 06/24/2017 Document Reviewed: 05/11/2016 Elsevier Patient Education  [place].

## 2020-09-09 ENCOUNTER — Other Ambulatory Visit: Payer: Self-pay | Admitting: Family Medicine

## 2020-09-11 ENCOUNTER — Other Ambulatory Visit: Payer: Self-pay

## 2020-09-11 ENCOUNTER — Ambulatory Visit (INDEPENDENT_AMBULATORY_CARE_PROVIDER_SITE_OTHER): Payer: Medicare Other | Admitting: Family Medicine

## 2020-09-11 ENCOUNTER — Encounter: Payer: Self-pay | Admitting: Family Medicine

## 2020-09-11 VITALS — BP 130/82 | HR 98 | Ht 66.0 in | Wt 186.0 lb

## 2020-09-11 DIAGNOSIS — M797 Fibromyalgia: Secondary | ICD-10-CM | POA: Diagnosis not present

## 2020-09-11 DIAGNOSIS — M5010 Cervical disc disorder with radiculopathy, unspecified cervical region: Secondary | ICD-10-CM | POA: Diagnosis not present

## 2020-09-11 DIAGNOSIS — M5416 Radiculopathy, lumbar region: Secondary | ICD-10-CM

## 2020-09-11 DIAGNOSIS — I2510 Atherosclerotic heart disease of native coronary artery without angina pectoris: Secondary | ICD-10-CM

## 2020-09-11 DIAGNOSIS — M999 Biomechanical lesion, unspecified: Secondary | ICD-10-CM | POA: Diagnosis not present

## 2020-09-11 MED ORDER — KETOROLAC TROMETHAMINE 30 MG/ML IJ SOLN
30.0000 mg | Freq: Once | INTRAMUSCULAR | Status: AC
  Administered 2020-09-11: 30 mg via INTRAMUSCULAR

## 2020-09-11 NOTE — Assessment & Plan Note (Signed)
I believe that patient is having more of a chronic flare.  Does have significant amount of stress, recently did have a loss of a friend of father secondary to Covid.  Patient's husband seems to be healing as well.  Encourage patient to continue to increase activity and find time for herself.  Responded fairly well to osteopathic manipulation.  Patient given a shot of Toradol secondary to the exacerbation.  Follow-up again 6 weeks

## 2020-09-11 NOTE — Patient Instructions (Signed)
Good to see you Sorry about what you are going through Find time for yourself Healthy choice prebiotic See me again in 4 weeks

## 2020-09-11 NOTE — Assessment & Plan Note (Signed)
Exacerbation as well.  Patient at this point but would not want to try anything such as an injection secondary to her difficulty with the blood sugar.  Hoping that the manipulation was helpful.  Follow-up again in 4 to 8 weeks

## 2020-09-11 NOTE — Progress Notes (Signed)
[facility] [address] [place] Phone: [phone] Subjective:   I Christine [name] am serving as a Education administrator for Dr. Hulan Saas.  This visit occurred during the SARS-CoV-2 public health emergency.  Safety protocols were in place, including screening questions prior to the visit, additional usage of staff PPE, and extensive cleaning of exam room while observing appropriate contact time as indicated for disinfecting solutions.   I'm seeing this patient by the request  of:  [name], MD  CC: Neck pain and headache  [id number]  Christine [name] is a 69 y.o. female coming in with complaint of back and neck pain. OMT 07/17/2020. Patient states she is having body pains. Neck is doing well. States she believes her pain is related to arthritis. Feels like fibromyalgia. Patient is a little anxious today as she recently had a friend die of COVID.  Patient is also had some difficulty with her blood sugars recently being taken off Actos.  Patient is also had recently her husband not doing well and is being seen by urology.  Multiple different stressors that seems to be causing more discomfort.  Mild radicular symptoms down the right leg as well.  Medications patient has been prescribed: Zanaflex, Tramadol, Vit D          Reviewed prior external information including notes and imaging from previsou exam, outside providers and external EMR if available.   As well as notes that were available from care everywhere and other healthcare systems.  Past medical history, social, surgical and family history all reviewed in electronic medical record.  No pertanent information unless stated regarding to the chief complaint.   Past Medical History:  Diagnosis Date  . Acute bursitis of right shoulder 08/29/2018   Right shoulder injected in August 29, 2018  . Allergic rhinitis   . Closed displaced fracture of fifth metatarsal bone of left  foot 03/16/2017  . DEPRESSIVE DISORDER NOT ELSEWHERE CLASSIFIED 03/27/2010   Qualifier: Diagnosis of  By: [name] MD, [name]   [name], NP , [facility] [place]    . Diabetes ([place]) 04/02/2016  . Diabetes mellitus   . Essential hypertension 03/14/2008   Qualifier: Diagnosis of  By: [name] MD, [name]    . Fibromyalgia 08/12/2011   Diagnosed by [name] , Rheumatologist 2010   . GERD (gastroesophageal reflux disease) 12/25/2016  . Gluteal tendonitis of right buttock 10/19/2017   Injected in October 19, 2017 Injected October 07, 2018  . Greater trochanteric bursitis of left hip 06/17/2016  . Greater trochanteric bursitis of right hip 11/27/2015   Injected 11/27/2015   . History of recurrent UTIs    [name]  . HTN (hypertension)   . Hx of osteopenia 10/11/2012   [facility] DEXA; ordered by [name] Lowest T score: -2.1 @ lumbar spine @ [facility] 11/01/12; decrease of 8.5% vs 05/2009. There is 9.2% risk over 10 yrs History fracture left elbow @ 4 Fractured left wrist , right elbow and nose post fall July/18/2013. Dr. [name] No FH Osteoporosis No PMH of bisphosphonate therapy (generic Fosamax Rxed but not filled  due to polypharmacy )   . Hyperlipidemia   . Hyperlipidemia associated with type 2 diabetes mellitus ([place]) 01/18/2007   Qualifier: Diagnosis of  By: [name]  With DM LDL goal = < 100, ideally < 70. Mi in father @ 57; 2 brothers @ 62 & 78    . Lumbar radiculopathy 04/11/2008  Qualifier: Diagnosis of  By: [name] MD, Erick Blinks well to epidural 01/12/2017  repeat epidural given May 2018  . Migraine headache    quiescent  . Nonallopathic lesion of cervical region 07/19/2014  . Nonallopathic lesion of lumbosacral region 01/15/2016  . Nonallopathic lesion of sacral region 06/27/2018  . Nonallopathic lesion-rib cage 09/26/2014  . Palpitations 04/15/2011  . Rotator cuff impingement syndrome of left shoulder 05/11/2014  . Seasonal allergic rhinitis 03/21/2012  . Sinusitis,  chronic 04/20/2016  . Sleep disorder    [name] May  . Subluxation of peroneal tendon of right foot 11/10/2017  . TIA (transient ischemic attack)   . TRANSIENT ISCHEMIC ATTACKS, HX OF 02/02/2008   Qualifier: Diagnosis of  By: [name]    . UNS ADVRS EFF UNS RX MEDICINAL&BIOLOGICAL SBSTNC 02/02/2008   Qualifier: Diagnosis of  By: [name] MD, [name]    . UNSPECIFIED MYALGIA AND MYOSITIS 02/02/2008   Qualifier: Diagnosis of  By: [name] MD, [name]    . UTI (urinary tract infection) 10/05/2014  . Viral upper respiratory tract infection with cough 12/31/2014  . Vitamin D deficiency 05/25/2008   Qualifier: Diagnosis of  By: [name] MD, [name]      Allergies  Allergen Reactions  . Vioxx [Rofecoxib] Other (See Comments)    Excess BP which caused TIA   . Prednisone Other (See Comments)    Mental status changes with high-dose oral agent. Tolerates epidural steroid injections. No associated rash or fever  . Augmentin [Amoxicillin-Pot Clavulanate]     Diarrhea  . Macrodantin [Nitrofurantoin Macrocrystal] Hives  . Sulfa Antibiotics     Hives  . Colesevelam Other (See Comments)    Leg Pain     Review of Systems:  No headache, visual changes, nausea, vomiting, diarrhea, constipation, dizziness, abdominal pain, skin rash, fevers, chills, night sweats, weight loss, swollen lymph nodes, body aches, joint swelling, chest pain, shortness of breath, mood changes. POSITIVE muscle aches  Objective  Blood pressure 130/82, pulse 98, height 5\' 6"  (1.676 m), weight 186 lb (84.4 kg), SpO2 96 %.   General: No apparent distress alert and oriented x3 mood and affect normal, dressed appropriately.  HEENT: Pupils equal, extraocular movements intact  Respiratory: Patient's speak in full sentences and does not appear short of breath  Cardiovascular: No lower extremity edema, non tender, no erythema  Neuro: Cranial nerves II through XII are intact, neurovascularly intact in all extremities with 2+ DTRs  and 2+ pulses.  Gait normal with good balance and coordination.  MSK:  tender with mild arthritic changes of multiple joints Neck exam does have some loss of lordosis, some tightness of the neck musculature.  Negative Spurling's.  Tender to palpation of the paraspinal musculature of the lumbar spine right greater than left.  Patient does have tightness with Corky Sox.  Multiple different areas of pain to even light palpation which is different than patient's baseline   Osteopathic findings  C2 flexed rotated and side bent right T3 extended rotated and side bent right inhaled rib T9 extended rotated and side bent left L2 flexed rotated and side bent right Sacrum right on right       Assessment and Plan: Fibromyalgia I believe that patient is having more of a chronic flare.  Does have significant amount of stress, recently did have a loss of a friend of father secondary to Covid.  Patient's husband seems to be healing as well.  Encourage patient to continue to increase activity and find time for herself.  Responded fairly well to osteopathic manipulation.  Patient given a shot of Toradol secondary to the exacerbation.  Follow-up again 6 weeks  Lumbar radiculopathy, right Exacerbation as well.  Patient at this point but would not want to try anything such as an injection secondary to her difficulty with the blood sugar.  Hoping that the manipulation was helpful.  Follow-up again in 4 to 8 weeks     Nonallopathic problems  Decision today to treat with OMT was based on Physical Exam  After verbal consent patient was treated with HVLA, ME, FPR techniques in cervical, rib, thoracic, lumbar, and sacral  areas  Patient tolerated the procedure well with improvement in symptoms  Patient given exercises, stretches and lifestyle modifications  See medications in patient instructions if given  Patient will follow up in 4-8 weeks      The above documentation has been reviewed and is accurate  and complete Lyndal Pulley, DO       Note: This dictation was prepared with Dragon dictation along with smaller phrase technology. Any transcriptional errors that result from this process are unintentional.

## 2020-10-08 ENCOUNTER — Other Ambulatory Visit: Payer: Self-pay

## 2020-10-08 ENCOUNTER — Encounter: Payer: Self-pay | Admitting: Endocrinology

## 2020-10-08 ENCOUNTER — Ambulatory Visit (INDEPENDENT_AMBULATORY_CARE_PROVIDER_SITE_OTHER): Payer: Medicare Other | Admitting: Endocrinology

## 2020-10-08 VITALS — BP 138/80 | HR 103 | Ht 66.0 in | Wt 185.0 lb

## 2020-10-08 DIAGNOSIS — E1151 Type 2 diabetes mellitus with diabetic peripheral angiopathy without gangrene: Secondary | ICD-10-CM | POA: Diagnosis not present

## 2020-10-08 DIAGNOSIS — I2510 Atherosclerotic heart disease of native coronary artery without angina pectoris: Secondary | ICD-10-CM | POA: Diagnosis not present

## 2020-10-08 DIAGNOSIS — Z794 Long term (current) use of insulin: Secondary | ICD-10-CM

## 2020-10-08 LAB — POCT GLYCOSYLATED HEMOGLOBIN (HGB A1C): Hemoglobin A1C: 7.9 % — AB (ref 4.0–5.6)

## 2020-10-08 MED ORDER — BASAGLAR KWIKPEN 100 UNIT/ML ~~LOC~~ SOPN: 40.0000 [IU] | PEN_INJECTOR | SUBCUTANEOUS | 11 refills | Status: DC

## 2020-10-08 NOTE — Patient Instructions (Addendum)
Your blood pressure is high today.  Please see your primary care provider soon, to have it rechecked check your blood sugar twice a day.  vary the time of day when you check, between before the 3 meals, and at bedtime.  also check if you have symptoms of your blood sugar being too high or too low.  please keep a record of the readings and bring it to your next appointment here.  You can write it on any piece of paper.  please call us sooner if your blood sugar goes below 70, or if you have a lot of readings over 200.   Please reduce the basaglar to 40 units each morning.   We are filling out the form for the Ozempic.  You will hear back about this.   As the pioglitazone leaves your system, your insulin need will probably further increase.   Please come back for a follow-up appointment in 3 months.

## 2020-10-08 NOTE — Progress Notes (Signed)
Subjective:    Patient ID: Christine [name], female    DOB: [birth year], 69 y.o.   MRN: [id number]  HPI Pt returns for f/u of diabetes mellitus: DM type: Insulin-requiring type 2 Dx'ed: 3875 Complications: TIA Therapy: insulin since 2014, and pioglitizone.   GDM: never. DKA: never.  Severe hypoglycemia: never.  Pancreatitis: never.   Other: She declines multiple daily injections; pioglitizone was for NASH, but she stopped due to edema; a trial to convert back to oral rx in 2017 was unsuccessful; she declines additional DM rx.   Interval history: she brings a record of his cbg's which I have reviewed today.  It varies from 62-344.  pt states she feels well in general.  She takes 55 units each morning.   Past Medical History:  Diagnosis Date  . Acute bursitis of right shoulder 08/29/2018   Right shoulder injected in August 29, 2018  . Allergic rhinitis   . Closed displaced fracture of fifth metatarsal bone of left foot 03/16/2017  . DEPRESSIVE DISORDER NOT ELSEWHERE CLASSIFIED 03/27/2010   Qualifier: Diagnosis of  By: [name] MD, [name]   [name], NP , [facility] [place]    . Diabetes ([place]) 04/02/2016  . Diabetes mellitus   . Essential hypertension 03/14/2008   Qualifier: Diagnosis of  By: [name] MD, [name]    . Fibromyalgia 08/12/2011   Diagnosed by [name] , Rheumatologist 2010   . GERD (gastroesophageal reflux disease) 12/25/2016  . Gluteal tendonitis of right buttock 10/19/2017   Injected in October 19, 2017 Injected October 07, 2018  . Greater trochanteric bursitis of left hip 06/17/2016  . Greater trochanteric bursitis of right hip 11/27/2015   Injected 11/27/2015   . History of recurrent UTIs    [name]  . HTN (hypertension)   . Hx of osteopenia 10/11/2012   [facility]; ordered by [name] Lowest T score: -2.1 @ lumbar spine @ [facility] 11/01/12; decrease of 8.5% vs 05/2009. There is 9.2% risk over 10 yrs History fracture left elbow @ 4  Fractured left wrist , right elbow and nose post fall July/18/2013. Dr. [name] No FH Osteoporosis No PMH of bisphosphonate therapy (generic Fosamax Rxed but not filled  due to polypharmacy )   . Hyperlipidemia   . Hyperlipidemia associated with type 2 diabetes mellitus ([place]) 01/18/2007   Qualifier: Diagnosis of  By: [name]  With DM LDL goal = < 100, ideally < 70. Mi in father @ 11; 2 brothers @ 87 & 66    . Lumbar radiculopathy 04/11/2008   Qualifier: Diagnosis of  By: [name] MD, Erick Blinks well to epidural 01/12/2017  repeat epidural given May 2018  . Migraine headache    quiescent  . Nonallopathic lesion of cervical region 07/19/2014  . Nonallopathic lesion of lumbosacral region 01/15/2016  . Nonallopathic lesion of sacral region 06/27/2018  . Nonallopathic lesion-rib cage 09/26/2014  . Palpitations 04/15/2011  . Rotator cuff impingement syndrome of left shoulder 05/11/2014  . Seasonal allergic rhinitis 03/21/2012  . Sinusitis, chronic 04/20/2016  . Sleep disorder    [name] May  . Subluxation of peroneal tendon of right foot 11/10/2017  . TIA (transient ischemic attack)   . TRANSIENT ISCHEMIC ATTACKS, HX OF 02/02/2008   Qualifier: Diagnosis of  By: [name]    . UNS ADVRS EFF UNS RX MEDICINAL&BIOLOGICAL SBSTNC 02/02/2008   Qualifier: Diagnosis of  By: [name] MD, [name]    . UNSPECIFIED [place]  02/02/2008   Qualifier: Diagnosis of  By: [name] MD, [name]    . UTI (urinary tract infection) 10/05/2014  . Viral upper respiratory tract infection with cough 12/31/2014  . Vitamin D deficiency 05/25/2008   Qualifier: Diagnosis of  By: [name] MD, [name]      Past Surgical History:  Procedure Laterality Date  . COLONOSCOPY     Dr Carlean [name]; hemorrhoids  . CORONARY ANGIOPLASTY  1997   for chest pain- negative   . POLYPECTOMY  2002   benign, hyperplastic polyp; rectal bleeding 2008  . TONSILLECTOMY    . TOTAL ABDOMINAL HYSTERECTOMY  1983   BSO for endometriosis   . WISDOM TOOTH EXTRACTION      Social History   Socioeconomic History  . Marital status: Married    Spouse name: Not on file  . Number of children: Not on file  . Years of education: Not on file  . Highest education level: Not on file  Occupational History  . 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 Smoking status: Never Smoker  . Smokeless tobacco: Never Used  Vaping Use  . Vaping Use: Never used  Substance and Sexual Activity  . Alcohol use: No  . Drug use: No  . Sexual activity: Not on file  Other Topics Concern  . Not on file  Social History Narrative   Regular exercise- no    Social Determinants of Health   Financial Resource Strain: Low Risk   . Difficulty of Paying Living Expenses: Not very hard  Food Insecurity:   . Worried About Charity fundraiser in the Last Year: Not on file  . Ran Out of Food in the Last Year: Not on file  Transportation Needs:   . Lack of Transportation (Medical): Not on file  . Lack of Transportation (Non-Medical): Not on file  Physical Activity:   . Days of Exercise per Week: Not on file  . Minutes of Exercise per Session: Not on file  Stress:   . Feeling of Stress : Not on file  Social Connections:   . Frequency of Communication with Friends and Family: Not on file  . Frequency of Social Gatherings with Friends and Family: Not on file  . Attends Religious Services: Not on file  . Active Member of Clubs or Organizations: Not on file  . Attends Archivist Meetings: Not on file  . Marital Status: Not on file  Intimate Partner Violence:   . Fear of Current or Ex-Partner: Not on file  . Emotionally Abused: Not on file  . Physically Abused: Not on file  . Sexually Abused: Not on file    Current Outpatient Medications on File Prior to Visit  Medication Sig Dispense Refill  . ARIPiprazole (ABILIFY) 2 MG tablet Take 2 mg by mouth daily.     [name] aspirin 81 MG tablet Take 81 mg  by mouth daily.      [name] atorvastatin (LIPITOR) 80 MG tablet Take 1 tablet (80 mg total) by mouth daily. (Patient taking differently: Take 40 mg by mouth daily. ) 90 tablet 3  . BD PEN NEEDLE NANO 2ND GEN 32G X 4 MM MISC USE ONCE A DAY AS DIRECTED (Patient taking differently: 1 each by Other route daily. E11.9) 90 each 3  . busPIRone (BUSPAR) 15 MG tablet Take 15 mg by mouth 2 (two) times daily.    . cephALEXin (KEFLEX) 500 MG capsule Take 1 capsule (500  mg total) by mouth 2 (two) times daily. 10 capsule 0  . DULoxetine (CYMBALTA) 60 MG capsule Take 60 mg by mouth daily.    . famotidine (PEPCID) 20 MG tablet Take 1 tablet (20 mg total) by mouth 2 (two) times daily. 14 tablet 0  . fluconazole (DIFLUCAN) 150 MG tablet Take 1 tablet today as directed; repeat in 72 hours 2 tablet 0  . hydrOXYzine (ATARAX/VISTARIL) 25 MG tablet Take 1 tablet (25 mg total) by mouth every 6 (six) hours. 15 tablet 0  . Lancets (ONETOUCH ULTRASOFT) lancets Check blood sugar once daily. Dx code: 250.00 100 each 12  . LORazepam (ATIVAN) 0.5 MG tablet Take 0.5 mg by mouth 2 (two) times daily as needed. For anxiety.    [name] losartan (COZAAR) 100 MG tablet Take 1 tablet (100 mg total) by mouth daily. 90 tablet 3  . metoprolol tartrate (LOPRESSOR) 100 MG tablet Take one tablet by mouth 2 hours prior to your CT 1 tablet 0  . naproxen (NAPROSYN) 500 MG tablet TAKE 1 TABLET (500 MG TOTAL) BY MOUTH 2 (TWO) TIMES DAILY WITH A MEAL. 180 tablet 1  . omeprazole (PRILOSEC) 20 MG capsule Take 1 capsule (20 mg total) by mouth daily. 90 capsule 3  . ONETOUCH ULTRA test strip USE 1 STRIP TWICE DAILY DX CODE E11.65 100 strip 3  . predniSONE (DELTASONE) 20 MG tablet Take 1 tablet (20 mg total) by mouth daily with breakfast. 5 tablet 0  . promethazine (PHENERGAN) 12.5 MG tablet Take 1 tablet (12.5 mg total) by mouth every 8 (eight) hours as needed for nausea or vomiting. 20 tablet 8  . tiZANidine (ZANAFLEX) 2 MG tablet TAKE 1 TABLET BY MOUTH EVERY 8  HOURS AS NEEDED FOR MUSCLE SPASM 60 tablet 0  . traZODone (DESYREL) 50 MG tablet TAKE 1/2 TO 1 TABLET BY MOUTH AT BEDTIME AS NEEDED FOR SLEEP 90 tablet 1  . Vitamin D, Ergocalciferol, (DRISDOL) 1.25 MG (50000 UNIT) CAPS capsule TAKE 1 CAPSULE BY MOUTH ONE TIME PER WEEK 12 capsule 0   No current facility-administered medications on file prior to visit.    Allergies  Allergen Reactions  . Vioxx [Rofecoxib] Other (See Comments)    Excess BP which caused TIA   . Prednisone Other (See Comments)    Mental status changes with high-dose oral agent. Tolerates epidural steroid injections. No associated rash or fever  . Augmentin [Amoxicillin-Pot Clavulanate]     Diarrhea  . Macrodantin [Nitrofurantoin Macrocrystal] Hives  . Sulfa Antibiotics     Hives  . Colesevelam Other (See Comments)    Leg Pain    Family History  Problem Relation Age of Onset  . Colon cancer Maternal Aunt   . Diabetes Paternal Uncle   . Heart attack Father 73  . COPD Mother   . Lung cancer Brother        smoker  . Mental illness Other        niece, committed suicide.  [name] Heart attack Other        brother X 2; @ 37 & 65  . Stroke Neg Hx     BP 138/80   Pulse (!) 103   Ht 5\' 6"  (1.676 m)   Wt 185 lb (83.9 kg)   LMP  (LMP Unknown)   SpO2 95%   BMI 29.86 kg/m    Review of Systems She denies hypoglycemia    Objective:   Physical Exam VITAL SIGNS:  See vs page GENERAL: no distress Pulses:  dorsalis pedis intact bilat.   MSK: no deformity of the feet CV: no leg edema Skin:  no ulcer on the feet.  normal color and temp on the feet. Neuro: sensation is intact to touch on the feet  Lab Results  Component Value Date   HGBA1C 7.9 (A) 10/08/2020   Lab Results  Component Value Date   CREATININE 0.66 07/12/2020   BUN 15 07/12/2020   NA 140 07/12/2020   K 3.9 07/12/2020   CL 104 07/12/2020   CO2 29 07/12/2020   Lab Results  Component Value Date   TSH 2.23 01/05/2020       Assessment & Plan:   HTN: is noted today Insulin-requiring type 2 DM, with TIA Hypoglycemia, due to insulin: this limits aggressiveness of glycemic control   Patient Instructions  Your blood pressure is high today.  Please see your primary care provider soon, to have it rechecked check your blood sugar twice a day.  vary the time of day when you check, between before the 3 meals, and at bedtime.  also check if you have symptoms of your blood sugar being too high or too low.  please keep a record of the readings and bring it to your next appointment here.  You can write it on any piece of paper.  please call us sooner if your blood sugar goes below 70, or if you have a lot of readings over 200.   Please reduce the basaglar to 40 units each morning.   We are filling out the form for the Ozempic.  You will hear back about this.   As the pioglitazone leaves your system, your insulin need will probably further increase.   Please come back for a follow-up appointment in 3 months.

## 2020-10-09 ENCOUNTER — Encounter: Payer: Self-pay | Admitting: Family Medicine

## 2020-10-09 ENCOUNTER — Ambulatory Visit (INDEPENDENT_AMBULATORY_CARE_PROVIDER_SITE_OTHER): Payer: Medicare Other | Admitting: Family Medicine

## 2020-10-09 VITALS — BP 122/84 | HR 89 | Ht 66.0 in | Wt 184.0 lb

## 2020-10-09 DIAGNOSIS — M5416 Radiculopathy, lumbar region: Secondary | ICD-10-CM

## 2020-10-09 DIAGNOSIS — M999 Biomechanical lesion, unspecified: Secondary | ICD-10-CM

## 2020-10-09 DIAGNOSIS — M797 Fibromyalgia: Secondary | ICD-10-CM

## 2020-10-09 DIAGNOSIS — I2510 Atherosclerotic heart disease of native coronary artery without angina pectoris: Secondary | ICD-10-CM

## 2020-10-09 MED ORDER — TRAMADOL HCL 50 MG PO TABS
50.0000 mg | ORAL_TABLET | Freq: Four times a day (QID) | ORAL | 0 refills | Status: DC | PRN
Start: 2020-10-09 — End: 2021-04-21

## 2020-10-09 NOTE — Progress Notes (Signed)
[place] Phone: [phone] Subjective:   [name], am serving as a scribe for Dr. Hulan Saas. This visit occurred during the SARS-CoV-2 public health emergency.  Safety protocols were in place, including screening questions prior to the visit, additional usage of staff PPE, and extensive cleaning of exam room while observing appropriate contact time as indicated for disinfecting solutions.  I'm seeing this patient by the request  of:  [name], MD  CC: Low back pain follow-up  [id number]  Christine Stutts Girgis is a 69 y.o. female coming in with complaint of back and neck pain. OMT 09/11/2020. Patient states that her right knee continues to bother her. Pain is getting worse. Patient also having right sided lower back pain and lateral hip pain. Unable to lie on right side. Pain feels like a shock patient states. Pain was traveling to right foot. Patient did ride to [facility] on Sunday. Patient had hard time walking after this ride.   Medications patient has been prescribed: Naproxen, Tramadol, Zanaflex, Vit D   Taking: Yes         Reviewed prior external information including notes and imaging from previsou exam, outside providers and external EMR if available.   As well as notes that were available from care everywhere and other healthcare systems.  Past medical history, social, surgical and family history all reviewed in electronic medical record.  No pertanent information unless stated regarding to the chief complaint.   Past Medical History:  Diagnosis Date  . Acute bursitis of right shoulder 08/29/2018   Right shoulder injected in August 29, 2018  . Allergic rhinitis   . Closed displaced fracture of fifth metatarsal bone of left foot 03/16/2017  . DEPRESSIVE DISORDER NOT ELSEWHERE CLASSIFIED 03/27/2010   Qualifier: Diagnosis of  By: [name] MD, [name]   [name], NP , [facility] [place] [place]    . Diabetes ([place]) 04/02/2016  . Diabetes mellitus   . Essential hypertension 03/14/2008   Qualifier: Diagnosis of  By: [name] MD, [name]    . Fibromyalgia 08/12/2011   Diagnosed by [name] , Rheumatologist 2010   . GERD (gastroesophageal reflux disease) 12/25/2016  . Gluteal tendonitis of right buttock 10/19/2017   Injected in October 19, 2017 Injected October 07, 2018  . Greater trochanteric bursitis of left hip 06/17/2016  . Greater trochanteric bursitis of right hip 11/27/2015   Injected 11/27/2015   . History of recurrent UTIs    [name]  . HTN (hypertension)   . Hx of osteopenia 10/11/2012   [facility]; ordered by [name] Lowest T score: -2.1 @ lumbar spine @ [facility] 11/01/12; decrease of 8.5% vs 05/2009. There is 9.2% risk over 10 yrs History fracture left elbow @ 4 Fractured left wrist , right elbow and nose post fall July/18/2013. Dr. [name] No FH Osteoporosis No PMH of bisphosphonate therapy (generic Fosamax Rxed but not filled  due to polypharmacy )   . Hyperlipidemia   . Hyperlipidemia associated with type 2 diabetes mellitus ([place]) 01/18/2007   Qualifier: Diagnosis of  By: [name]  With DM LDL goal = < 100, ideally < 70. Mi in father @ 16; 2 brothers @ 55 & 6    . Lumbar radiculopathy 04/11/2008   Qualifier: Diagnosis of  By: [name] MD, Erick Blinks well to epidural 01/12/2017  repeat epidural given May 2018  . Migraine headache  quiescent  . Nonallopathic lesion of cervical region 07/19/2014  . Nonallopathic lesion of lumbosacral region 01/15/2016  . Nonallopathic lesion of sacral region 06/27/2018  . Nonallopathic lesion-rib cage 09/26/2014  . Palpitations 04/15/2011  . Rotator cuff impingement syndrome of left shoulder 05/11/2014  . Seasonal allergic rhinitis 03/21/2012  . Sinusitis, chronic 04/20/2016  . Sleep disorder    [name] May  . Subluxation of peroneal tendon of right foot 11/10/2017  . TIA (transient ischemic attack)   .  TRANSIENT ISCHEMIC ATTACKS, HX OF 02/02/2008   Qualifier: Diagnosis of  By: [name]    . UNS ADVRS EFF UNS RX MEDICINAL&BIOLOGICAL SBSTNC 02/02/2008   Qualifier: Diagnosis of  By: [name] MD, [name]    . UNSPECIFIED MYALGIA AND MYOSITIS 02/02/2008   Qualifier: Diagnosis of  By: [name] MD, [name]    . UTI (urinary tract infection) 10/05/2014  . Viral upper respiratory tract infection with cough 12/31/2014  . Vitamin D deficiency 05/25/2008   Qualifier: Diagnosis of  By: [name] MD, [name]      Allergies  Allergen Reactions  . Vioxx [Rofecoxib] Other (See Comments)    Excess BP which caused TIA   . Prednisone Other (See Comments)    Mental status changes with high-dose oral agent. Tolerates epidural steroid injections. No associated rash or fever  . Augmentin [Amoxicillin-Pot Clavulanate]     Diarrhea  . Macrodantin [Nitrofurantoin Macrocrystal] Hives  . Sulfa Antibiotics     Hives  . Colesevelam Other (See Comments)    Leg Pain     Review of Systems:  No  visual changes, nausea, vomiting, diarrhea, constipation, dizziness, abdominal pain, skin rash, fevers, chills, night sweats, weight loss, swollen lymph nodes,  joint swelling, chest pain, shortness of breath, mood changes. POSITIVE muscle aches body aches, intermittent headache  Objective  Blood pressure 122/84, pulse 89, height 5\' 6"  (1.676 m), weight 184 lb (83.5 kg), SpO2 96 %.   General: No apparent distress alert and oriented x3 mood and affect normal, dressed appropriately.  HEENT: Pupils equal, extraocular movements intact  Respiratory: Patient's speak in full sentences and does not appear short of breath  Cardiovascular: No lower extremity edema, non tender, no erythema  Gait mild antalgic MSK: Moderate arthritic changes of multiple joints Back -low back exam does have some loss of lordosis.  Osteopathic findings  C6 flexed rotated and side bent left T5 extended rotated and side bent right inhaled  rib T9 extended rotated and side bent left L1 flexed rotated and side bent right Sacrum right on right       Assessment and Plan:  Lumbar radiculopathy, right I feel the patient is having worsening lumbar radiculopathy. We discussed again about the potential for an epidural which patient declined secondary to patient having difficulty with control of her blood glucose. Patient has responded fairly well to osteopathic manipulation and hopefully this will be beneficial. Discussed continuing home exercises otherwise. Refilled tramadol to use very sparingly. Encourage more of a half a tablet with Tylenol. Patient will follow up with me again in 4 to 8 weeks. If continuing to have pain consider the possibility of either greater trochanteric injection, piriformis injection or the possibility of an epidural.  Fibromyalgia Continue the Cymbalta for now    Nonallopathic problems  Decision today to treat with OMT was based on Physical Exam  After verbal consent patient was treated with HVLA, ME, FPR techniques in cervical, rib, thoracic, lumbar, and sacral  areas  Patient tolerated the procedure  well with improvement in symptoms  Patient given exercises, stretches and lifestyle modifications  See medications in patient instructions if given  Patient will follow up in 4-8 weeks      The above documentation has been reviewed and is accurate and complete Lyndal Pulley, DO       Note: This dictation was prepared with Dragon dictation along with smaller phrase technology. Any transcriptional errors that result from this process are unintentional.

## 2020-10-09 NOTE — Assessment & Plan Note (Signed)
I feel the patient is having worsening lumbar radiculopathy. We discussed again about the potential for an epidural which patient declined secondary to patient having difficulty with control of her blood glucose. Patient has responded fairly well to osteopathic manipulation and hopefully this will be beneficial. Discussed continuing home exercises otherwise. Refilled tramadol to use very sparingly. Encourage more of a half a tablet with Tylenol. Patient will follow up with me again in 4 to 8 weeks. If continuing to have pain consider the possibility of either greater trochanteric injection, piriformis injection or the possibility of an epidural.

## 2020-10-09 NOTE — Patient Instructions (Signed)
Keep doing zanaflex at night See me in 4-5 weeks

## 2020-10-09 NOTE — Assessment & Plan Note (Signed)
Continue the Cymbalta for now

## 2020-10-15 DIAGNOSIS — F410 Panic disorder [episodic paroxysmal anxiety] without agoraphobia: Secondary | ICD-10-CM | POA: Diagnosis not present

## 2020-10-15 DIAGNOSIS — F339 Major depressive disorder, recurrent, unspecified: Secondary | ICD-10-CM | POA: Diagnosis not present

## 2020-10-16 DIAGNOSIS — N952 Postmenopausal atrophic vaginitis: Secondary | ICD-10-CM | POA: Diagnosis not present

## 2020-10-16 DIAGNOSIS — N3000 Acute cystitis without hematuria: Secondary | ICD-10-CM | POA: Diagnosis not present

## 2020-11-01 ENCOUNTER — Other Ambulatory Visit: Payer: Self-pay

## 2020-11-01 ENCOUNTER — Ambulatory Visit: Payer: Medicare Other | Admitting: Pharmacist

## 2020-11-01 DIAGNOSIS — Z794 Long term (current) use of insulin: Secondary | ICD-10-CM

## 2020-11-01 DIAGNOSIS — E785 Hyperlipidemia, unspecified: Secondary | ICD-10-CM

## 2020-11-01 DIAGNOSIS — E1151 Type 2 diabetes mellitus with diabetic peripheral angiopathy without gangrene: Secondary | ICD-10-CM

## 2020-11-01 DIAGNOSIS — E1169 Type 2 diabetes mellitus with other specified complication: Secondary | ICD-10-CM

## 2020-11-01 DIAGNOSIS — I10 Essential (primary) hypertension: Secondary | ICD-10-CM

## 2020-11-01 NOTE — Patient Instructions (Signed)
Visit Information  Phone number for Pharmacist: [phone]  Goals Addressed            This Visit's Progress    Pharmacy Care Plan       CARE PLAN ENTRY  Current Barriers:   Chronic Disease Management support, education, and care coordination needs related to Hypertension, Hyperlipidemia, and Diabetes   Hypertension BP Readings from Last 3 Encounters:  08/05/20 (!) 150/80  07/24/20 124/70  07/17/20 124/82   Pharmacist Clinical Goal(s): o Over the next 90 days, patient will work with PharmD and providers to maintain BP goal <130/80  Current regimen:  o Losartan 100 mg daily  Interventions: o Discussed BP goals and benefits of medications for prevention of heart attack / stroke o Discussed white coat syndrome and importance of checking BP at home  Patient self care activities - Over the next 90 days, patient will: o Check BP 1-2 times weekly, document, and provide at future appointments o Ensure daily salt intake < 2300 mg/day  Hyperlipidemia Lab Results  Component Value Date/Time   LDLCALC 118 (H) 04/23/2020 01:31 PM   Pharmacist Clinical Goal(s): o Over the next 90 days, patient will work with PharmD and providers to achieve LDL goal < 100  Current regimen:  o Atorvastatin 80 mg daily o Aspirin 81 mg daily  Interventions: o Discussed cholesterol goals and benefits of medications for prevention of heart attack / stroke o Discussed foods high in cholesterol to avoid  Patient self care activities - Over the next 90 days, patient will: o Continue current medications o Reduce cholesterol in diet  Diabetes Lab Results  Component Value Date/Time   HGBA1C 7.9 (A) 08/05/2020 03:13 PM   HGBA1C 7.0 (A) 06/04/2020 11:09 AM   HGBA1C 6.6 (H) 05/24/2015 11:37 AM   HGBA1C 8.3 (H) 02/22/2015 11:24 AM   Pharmacist Clinical Goal(s): o Over the next 90 days, patient will work with PharmD and providers to achieve A1c goal <7%  Current regimen:  o Basaglar 60 units  daily  Interventions: o Discussed blood sugar goals and benefits of medications for prevention of heart attack / stroke o Patient will be starting Ozempic through [facility] once she is approved. Plan is to reduce Basaglar to 40 units when she starts Ozempic  Patient self care activities - Over the next 90 days, patient will: o Check blood sugar once daily, document, and provide at future appointments o Contact provider with any episodes of hypoglycemia o Reduce Basaglar to 40 mg when starting Ozempic  Medication management  Pharmacist Clinical Goal(s): o Over the next 90 days, patient will work with PharmD and providers to maintain optimal medication adherence  Current pharmacy: [facility]  Interventions o Comprehensive medication review performed. o Continue current medication management strategy  Patient self care activities - Over the next 90 days, patient will: o Focus on medication adherence by fill date o Take medications as prescribed o Report any questions or concerns to PharmD and/or provider(s)  Please see past updates related to this goal by clicking on the "Past Updates" button in the selected goal       The patient verbalized understanding of instructions, educational materials, and care plan provided today and declined offer to receive copy of patient instructions, educational materials, and care plan.  Telephone follow up appointment with pharmacy team member scheduled for: 3 months  Charlene Brooke, PharmD, [facility] Clinical Pharmacist [facility] [phone]

## 2020-11-01 NOTE — Chronic Care Management (AMB) (Signed)
Chronic Care Management Pharmacy  Name: Colene Mines  MRN: [id number] DOB: 05-09-1951   Chief Complaint/ HPI  Christine [name],  69 y.o. , female presents for their Follow-Up CCM visit with the clinical pharmacist via telephone due to COVID-19 Pandemic.  PCP : [name], MD Patient Care Team: [name], MD as PCP - General (Internal Medicine) Belva Crome, MD as PCP - Cardiology (Cardiology) Pieter Partridge, DO as Consulting Physician (Neurology) Charlton Haws, [facility] as Pharmacist (Pharmacist)  Their chronic conditions include: Hypertension, Hyperlipidemia, Diabetes, Coronary Artery Disease, GERD, Depression, Osteopenia, Allergic Rhinitis and Back pain, Fibromyalgia   Pt has lived in [place] most of her life, did live in [place] for 2 years with husband. She lost a son at 26 years old in 2012, another son lives in [place], with 9 year old grandchildren. Husband has retired from [employer]. They are involved with the church. Her best friend's husband passed away recently and it has been hard. Patient reports the biggest issue is her blood sugar right now.  July 26 - feet started swelling severely, stopped Actos. Pt has had 5 UTIs since April.  On Actos - pt was on 28 units of insulin. Off Actos - up to 55 units.   Office Visits: 07/24/20 NP Jodi Mourning: UTI, tx with Bactrim. PT developed hives, switched to Keflex.  07/12/20 Dr Sharlet Salina OV: stopped Actos due to leg swelling. Rx'd Keflex for UTI.  05/31/20 Dr Sharlet Salina OV: muscle aches after increasing atorvastatin 40 to 80 mg. Reduced statin back to 40 mg. Rx'd Macrobid for UTI.  04/23/20 Dr Sharlet Salina OV: chronic f/u  Consult Visit: 10/08/20 Dr Loanne Drilling (endocrine): add ozempic (via PAP), decrease insulin to 40 units after starting ozempic  08/05/20 Dr Loanne Drilling (endocrine): increased Basaglar to 55 units. A1c up to 7.9, on prednisone course, UTIs. 07/17/20 Dr Tamala Julian (sports med): no med changes,  pt doing well at the moment. 06/09/20 ED visit: rash, suspected allergic reaction to macrobid. 06/08/20 ED visit: rash/urticaria, may be due to Kenalog/Marcaine injection. Improved with IV Benadryl, IV Pepcid.  Allergies  Allergen Reactions  . Vioxx [Rofecoxib] Other (See Comments)    Excess BP which caused TIA   . Prednisone Other (See Comments)    Mental status changes with high-dose oral agent. Tolerates epidural steroid injections. No associated rash or fever  . Augmentin [Amoxicillin-Pot Clavulanate]     Diarrhea  . Macrodantin [Nitrofurantoin Macrocrystal] Hives  . Sulfa Antibiotics     Hives  . Colesevelam Other (See Comments)    Leg Pain    Medications: Outpatient Encounter Medications as of 11/01/2020  Medication Sig Note  . aspirin 81 MG tablet Take 81 mg by mouth daily.     [name] atorvastatin (LIPITOR) 80 MG tablet Take 1 tablet (80 mg total) by mouth daily. (Patient taking differently: Take 40 mg by mouth daily. ) 09/04/2020: Dose reduced back to 40 mg due to muscle aches  . BD PEN NEEDLE NANO 2ND GEN 32G X 4 MM MISC USE ONCE A DAY AS DIRECTED (Patient taking differently: 1 each by Other route daily. E11.9)   . busPIRone (BUSPAR) 15 MG tablet Take 15 mg by mouth 2 (two) times daily.   . cephALEXin (KEFLEX) 500 MG capsule Take 1 capsule (500 mg total) by mouth 2 (two) times daily.   . DULoxetine (CYMBALTA) 60 MG capsule Take 60 mg by mouth daily.   . famotidine (PEPCID) 20 MG tablet Take 1 tablet (  20 mg total) by mouth 2 (two) times daily.   . fluconazole (DIFLUCAN) 150 MG tablet Take 1 tablet today as directed; repeat in 72 hours   . hydrOXYzine (ATARAX/VISTARIL) 25 MG tablet Take 1 tablet (25 mg total) by mouth every 6 (six) hours.   . Insulin Glargine (BASAGLAR KWIKPEN) 100 UNIT/ML Inject 40 Units into the skin every morning.   . Lancets (ONETOUCH ULTRASOFT) lancets Check blood sugar once daily. Dx code: 250.00   . LORazepam (ATIVAN) 0.5 MG tablet Take 0.5 mg by mouth 2  (two) times daily as needed. For anxiety.   [name] losartan (COZAAR) 100 MG tablet Take 1 tablet (100 mg total) by mouth daily.   . metoprolol tartrate (LOPRESSOR) 100 MG tablet Take one tablet by mouth 2 hours prior to your CT   . naproxen (NAPROSYN) 500 MG tablet TAKE 1 TABLET (500 MG TOTAL) BY MOUTH 2 (TWO) TIMES DAILY WITH A MEAL.   [name] omeprazole (PRILOSEC) 20 MG capsule Take 1 capsule (20 mg total) by mouth daily.   Glory Rosebush ULTRA test strip USE 1 STRIP TWICE DAILY DX CODE E11.65   . predniSONE (DELTASONE) 20 MG tablet Take 1 tablet (20 mg total) by mouth daily with breakfast.   . promethazine (PHENERGAN) 12.5 MG tablet Take 1 tablet (12.5 mg total) by mouth every 8 (eight) hours as needed for nausea or vomiting.   [name] tiZANidine (ZANAFLEX) 2 MG tablet TAKE 1 TABLET BY MOUTH EVERY 8 HOURS AS NEEDED FOR MUSCLE SPASM   . traMADol (ULTRAM) 50 MG tablet Take 1 tablet (50 mg total) by mouth every 6 (six) hours as needed.   . traZODone (DESYREL) 50 MG tablet TAKE 1/2 TO 1 TABLET BY MOUTH AT BEDTIME AS NEEDED FOR SLEEP   . Vitamin D, Ergocalciferol, (DRISDOL) 1.25 MG (50000 UNIT) CAPS capsule TAKE 1 CAPSULE BY MOUTH ONE TIME PER WEEK   . Semaglutide (OZEMPIC, 0.25 OR 0.5 MG/DOSE, [place]) Inject 0.25 mg into the skin once a week. (Patient not taking: Reported on 11/01/2020) 11/01/2020: Not started yet - pursuing PAP  . [DISCONTINUED] ARIPiprazole (ABILIFY) 2 MG tablet Take 2 mg by mouth daily.  (Patient not taking: Reported on 11/01/2020)    No facility-administered encounter medications on file as of 11/01/2020.    Wt Readings from Last 3 Encounters:  10/09/20 184 lb (83.5 kg)  10/08/20 185 lb (83.9 kg)  09/11/20 186 lb (84.4 kg)    Current Diagnosis/Assessment:    Goals Addressed            This Visit's Progress   . Pharmacy Care Plan       CARE PLAN ENTRY  Current Barriers:  . Chronic Disease Management support, education, and care coordination needs related to Hypertension,  Hyperlipidemia, and Diabetes   Hypertension BP Readings from Last 3 Encounters:  08/05/20 (!) 150/80  07/24/20 124/70  07/17/20 124/82 .  Pharmacist Clinical Goal(s): o Over the next 90 days, patient will work with PharmD and providers to maintain BP goal <130/80 . Current regimen:  o Losartan 100 mg daily . Interventions: o Discussed BP goals and benefits of medications for prevention of heart attack / stroke o Discussed white coat syndrome and importance of checking BP at home . Patient self care activities - Over the next 90 days, patient will: o Check BP 1-2 times weekly, document, and provide at future appointments o Ensure daily salt intake < 2300 mg/day  Hyperlipidemia Lab Results  Component Value Date/Time   LDLCALC  118 (H) 04/23/2020 01:31 PM .  Pharmacist Clinical Goal(s): o Over the next 90 days, patient will work with PharmD and providers to achieve LDL goal < 100 . Current regimen:  o Atorvastatin 80 mg daily o Aspirin 81 mg daily . Interventions: o Discussed cholesterol goals and benefits of medications for prevention of heart attack / stroke o Discussed foods high in cholesterol to avoid . Patient self care activities - Over the next 90 days, patient will: o Continue current medications o Reduce cholesterol in diet  Diabetes Lab Results  Component Value Date/Time   HGBA1C 7.9 (A) 08/05/2020 03:13 PM   HGBA1C 7.0 (A) 06/04/2020 11:09 AM   HGBA1C 6.6 (H) 05/24/2015 11:37 AM   HGBA1C 8.3 (H) 02/22/2015 11:24 AM .  Pharmacist Clinical Goal(s): o Over the next 90 days, patient will work with PharmD and providers to achieve A1c goal <7% . Current regimen:  o Basaglar 60 units daily . Interventions: o Discussed blood sugar goals and benefits of medications for prevention of heart attack / stroke o Patient will be starting Ozempic through PAP once she is approved. Plan is to reduce Basaglar to 40 units when she starts Ozempic . Patient self care activities -  Over the next 90 days, patient will: o Check blood sugar once daily, document, and provide at future appointments o Contact provider with any episodes of hypoglycemia o Reduce Basaglar to 40 mg when starting Ozempic  Medication management . Pharmacist Clinical Goal(s): o Over the next 90 days, patient will work with PharmD and providers to maintain optimal medication adherence . [facility] . Interventions o Comprehensive medication review performed. o Continue current medication management strategy . Patient self care activities - Over the next 90 days, patient will: o Focus on medication adherence by fill date o Take medications as prescribed o Report any questions or concerns to PharmD and/or provider(s)  Please see past updates related to this goal by clicking on the "Past Updates" button in the selected goal        Hypertension   BP goal is:  <130/80  Office blood pressures are  BP Readings from Last 3 Encounters:  10/09/20 122/84  10/08/20 138/80  09/11/20 130/82   Kidney Function Lab Results  Component Value Date/Time   CREATININE 0.66 07/12/2020 11:28 AM   CREATININE 0.81 06/09/2020 12:25 PM   CREATININE 0.64 05/31/2020 02:20 PM   CREATININE 0.62 03/13/2011 04:36 PM   GFR 91.48 05/31/2020 02:20 PM   GFRNONAA >60 06/09/2020 12:25 PM   GFRAA >60 06/09/2020 12:25 PM   K 3.9 07/12/2020 11:28 AM   K 3.6 06/09/2020 12:25 PM   Patient checks BP at home 1-2x per week Patient home BP readings are ranging: 120/70  Patient has failed these meds in the past: n/a Patient is currently controlled on the following medications:  . Losartan 100 mg daily  We discussed diet and exercise extensively; white coat syndrome; benefits of monitoring BP at home  Plan  Continue current medications and control with diet and exercise     Hyperlipidemia   LDL goal < 70 Hx TIA  Lipid Panel     Component Value Date/Time   CHOL 194 04/23/2020 1331   TRIG 94.0  04/23/2020 1331   HDL 57.60 04/23/2020 1331   LDLCALC 118 (H) 04/23/2020 1331    Hepatic Function Latest Ref Rng & Units 07/12/2020 05/31/2020 04/23/2020  Total Protein 6.1 - 8.1 g/dL 6.1 6.9 6.9  Albumin 3.5 - 5.2  g/dL - 4.2 4.1  AST 10 - 35 U/L _0 ALT 6 - 29 U/L _1 Alk Phosphatase 39 - 117 U/L - 67 59  Total Bilirubin 0.2 - 1.2 mg/dL 0.4 0.4 0.4  Bilirubin, Direct 0.0 - 0.3 mg/dL - - -    The 10-year ASCVD risk score Mikey Bussing DC Jr., et al., 2013) is: 23%   Values used to calculate the score:     Age: 54 years     Sex: Female     Is Non-Hispanic African American: No     Diabetic: Yes     Tobacco smoker: No     Systolic Blood Pressure: 334 mmHg     Is BP treated: Yes     HDL Cholesterol: 57.6 mg/dL     Total Cholesterol: 194 mg/dL   Patient has failed these meds in past: atorvastatin 80 mg - myalgia Patient is currently uncontrolled on the following medications:  . Atorvastatin 40 mg daily . Aspirin 81 mg daily   We discussed:  diet and exercise extensively; Cholesterol goals; benefits of statin for ASCVD risk reduction; in the future pt may benefit from switching to Crestor 20-40 mg due to more potent LDL reduction and less risk of myalgias; pt is currently working lifestyle modifications to reach LDL goal  Plan  Continue current medications and control with diet and exercise  Diabetes   A1c goal <7%  Dx 2006, insulin since 2014  Recent Relevant Labs: Lab Results  Component Value Date/Time   HGBA1C 7.9 (A) 10/08/2020 11:02 AM   HGBA1C 7.9 (A) 08/05/2020 03:13 PM   HGBA1C 6.6 (H) 05/24/2015 11:37 AM   HGBA1C 8.3 (H) 02/22/2015 11:24 AM   GFR 91.48 05/31/2020 02:20 PM   GFR 86.79 04/23/2020 01:31 PM   MICROALBUR 10 04/23/2020 03:47 PM   MICROALBUR 1.1 11/21/2014 10:40 AM   MICROALBUR 0.6 08/08/2014 12:07 PM    Last diabetic Eye exam:  Lab Results  Component Value Date/Time   HMDIABEYEEXA No Retinopathy 05/18/2018 10:01 AM    Last diabetic Foot exam:  No results found for: HMDIABFOOTEX   Checking BG: Daily  Recent FBG Readings: 120, 111, 86, 96  Patient has failed these meds in past: Janumet, metformin, pioglitazone, repaglinide Patient is currently uncontrolled on the following medications: . Basaglar 60 units daily . Testing supplies . Ozempic 0.25 mg weekly (pending via [employer])  We discussed: Pt is in process of starting Ozempic through [employer], application has been submitted and awaiting approval; plan is to reduce insulin to 40 units when starting ozempic   Plan  Continue current medications and control with diet and exercise  F/U with endocrine re: Ozempic PAP approval   Depression   Managed per NP [name]  Depression screen [facility] 2/9 04/23/2020 02/08/2019 05/21/2016  Decreased Interest 0 0 0  Down, Depressed, Hopeless 0 0 0  PHQ - 2 Score 0 0 0  Some recent data might be hidden   Patient has failed these meds in past: Pristiq, aripiprazole (never started) Patient is currently controlled on the following medications:  . Duloxetine 60 mg daily . Buspirone 15 mg BID . Lorazepam 0.5 mg BID prn . Trazodone 50 mg 1/2 tab HS  We discussed:  Pt reports mood is stable and controlled currently; she uses lorazepam sparingly; she tried aripiprazole for a few days but had side effects so she stopped - she reports feeling "not like herself"  Plan  Continue current  medications  Medication Management   [facility] Uses pill box? No - prefers bottles Pt endorses 100% compliance  We discussed: Discussed benefits of medication synchronization, packaging and delivery as well as enhanced pharmacist oversight with [employer]. Pt is somewhat interested but may be changing her insurance plan and isn't sure which pharmacy will be preferred, currently [facility].  Plan  Continue current medication management strategy    Follow up: 3 month phone visit  Charlene Brooke,  PharmD, BCACP Clinical Pharmacist [facility] [phone]

## 2020-11-04 ENCOUNTER — Other Ambulatory Visit: Payer: Self-pay | Admitting: Family Medicine

## 2020-11-04 ENCOUNTER — Other Ambulatory Visit: Payer: Self-pay | Admitting: Endocrinology

## 2020-11-04 NOTE — Progress Notes (Deleted)
[place] Phone: [phone] Subjective:    I'm seeing this patient by the request  of:  [name], MD  CC:   [id number]  Anaaya Stutts Lafond is a 69 y.o. female coming in with complaint of back and neck pain. OMT 10/09/2020. Patient states   Medications patient has been prescribed: Naproxen, Zanaflex, Vit D, Tramadol  Taking:         Reviewed prior external information including notes and imaging from previsou exam, outside providers and external EMR if available.   As well as notes that were available from care everywhere and other healthcare systems.  Past medical history, social, surgical and family history all reviewed in electronic medical record.  No pertanent information unless stated regarding to the chief complaint.   Past Medical History:  Diagnosis Date  . Acute bursitis of right shoulder 08/29/2018   Right shoulder injected in August 29, 2018  . Allergic rhinitis   . Closed displaced fracture of fifth metatarsal bone of left foot 03/16/2017  . DEPRESSIVE DISORDER NOT ELSEWHERE CLASSIFIED 03/27/2010   Qualifier: Diagnosis of  By: [name] MD, [name]   [name], NP , [facility] [place] [place]    . Diabetes ([place]) 04/02/2016  . Diabetes mellitus   . Essential hypertension 03/14/2008   Qualifier: Diagnosis of  By: [name] MD, [name]    . Fibromyalgia 08/12/2011   Diagnosed by [name] , Rheumatologist 2010   . GERD (gastroesophageal reflux disease) 12/25/2016  . Gluteal tendonitis of right buttock 10/19/2017   Injected in October 19, 2017 Injected October 07, 2018  . Greater trochanteric bursitis of left hip 06/17/2016  . Greater trochanteric bursitis of right hip 11/27/2015   Injected 11/27/2015   . History of recurrent UTIs    [name]  . HTN (hypertension)   . Hx of osteopenia 10/11/2012   [facility] DEXA; ordered by [name] Lowest T score: -2.1 @ lumbar  spine @ [facility] 11/01/12; decrease of 8.5% vs 05/2009. There is 9.2% risk over 10 yrs History fracture left elbow @ 4 Fractured left wrist , right elbow and nose post fall July/18/2013. Dr. [name] No FH Osteoporosis No PMH of bisphosphonate therapy (generic Fosamax Rxed but not filled  due to polypharmacy )   . Hyperlipidemia   . Hyperlipidemia associated with type 2 diabetes mellitus ([place]) 01/18/2007   Qualifier: Diagnosis of  By: [name]  With DM LDL goal = < 100, ideally < 70. Mi in father @ 17; 2 brothers @ 73 & 46    . Lumbar radiculopathy 04/11/2008   Qualifier: Diagnosis of  By: [name] MD, Erick Blinks well to epidural 01/12/2017  repeat epidural given May 2018  . Migraine headache    quiescent  . Nonallopathic lesion of cervical region 07/19/2014  . Nonallopathic lesion of lumbosacral region 01/15/2016  . Nonallopathic lesion of sacral region 06/27/2018  . Nonallopathic lesion-rib cage 09/26/2014  . Palpitations 04/15/2011  . Rotator cuff impingement syndrome of left shoulder 05/11/2014  . Seasonal allergic rhinitis 03/21/2012  . Sinusitis, chronic 04/20/2016  . Sleep disorder    [name] May  . Subluxation of peroneal tendon of right foot 11/10/2017  . TIA (transient ischemic attack)   . TRANSIENT ISCHEMIC ATTACKS, HX OF 02/02/2008   Qualifier: Diagnosis of  By: [name]    . UNS ADVRS EFF UNS RX MEDICINAL&BIOLOGICAL SBSTNC 02/02/2008   Qualifier: Diagnosis of  By: [name] MD, [name]    . UNSPECIFIED Vickie Epley AND MYOSITIS 02/02/2008   Qualifier: Diagnosis of  By: [name] MD, [name]    . UTI (urinary tract infection) 10/05/2014  . Viral upper respiratory tract infection with cough 12/31/2014  . Vitamin D deficiency 05/25/2008   Qualifier: Diagnosis of  By: [name] MD, [name]      Allergies  Allergen Reactions  . Vioxx [Rofecoxib] Other (See Comments)    Excess BP which caused TIA   . Prednisone Other (See Comments)    Mental status changes with high-dose oral agent.  Tolerates epidural steroid injections. No associated rash or fever  . Augmentin [Amoxicillin-Pot Clavulanate]     Diarrhea  . Macrodantin [Nitrofurantoin Macrocrystal] Hives  . Sulfa Antibiotics     Hives  . Colesevelam Other (See Comments)    Leg Pain     Review of Systems:  No headache, visual changes, nausea, vomiting, diarrhea, constipation, dizziness, abdominal pain, skin rash, fevers, chills, night sweats, weight loss, swollen lymph nodes, body aches, joint swelling, chest pain, shortness of breath, mood changes. POSITIVE muscle aches  Objective  There were no vitals taken for this visit.   General: No apparent distress alert and oriented x3 mood and affect normal, dressed appropriately.  HEENT: Pupils equal, extraocular movements intact  Respiratory: Patient's speak in full sentences and does not appear short of breath  Cardiovascular: No lower extremity edema, non tender, no erythema  Neuro: Cranial nerves II through XII are intact, neurovascularly intact in all extremities with 2+ DTRs and 2+ pulses.  Gait normal with good balance and coordination.  MSK:  Non tender with full range of motion and good stability and symmetric strength and tone of shoulders, elbows, wrist, hip, knee and ankles bilaterally.  Back - Normal skin, Spine with normal alignment and no deformity.  No tenderness to vertebral process palpation.  Paraspinous muscles are not tender and without spasm.   Range of motion is full at neck and lumbar sacral regions  Osteopathic findings  C2 flexed rotated and side bent right C6 flexed rotated and side bent left T3 extended rotated and side bent right inhaled rib T9 extended rotated and side bent left L2 flexed rotated and side bent right Sacrum right on right       Assessment and Plan:    Nonallopathic problems  Decision today to treat with OMT was based on Physical Exam  After verbal consent patient was treated with HVLA, ME, FPR techniques in  cervical, rib, thoracic, lumbar, and sacral  areas  Patient tolerated the procedure well with improvement in symptoms  Patient given exercises, stretches and lifestyle modifications  See medications in patient instructions if given  Patient will follow up in 4-8 weeks      The above documentation has been reviewed and is accurate and complete Jacqualin Combes       Note: This dictation was prepared with Dragon dictation along with smaller phrase technology. Any transcriptional errors that result from this process are unintentional.

## 2020-11-05 ENCOUNTER — Ambulatory Visit: Payer: Medicare Other | Admitting: Family Medicine

## 2020-11-05 DIAGNOSIS — Z20828 Contact with and (suspected) exposure to other viral communicable diseases: Secondary | ICD-10-CM | POA: Diagnosis not present

## 2020-11-05 DIAGNOSIS — J0100 Acute maxillary sinusitis, unspecified: Secondary | ICD-10-CM | POA: Diagnosis not present

## 2020-11-05 DIAGNOSIS — J209 Acute bronchitis, unspecified: Secondary | ICD-10-CM | POA: Diagnosis not present

## 2020-11-13 ENCOUNTER — Other Ambulatory Visit: Payer: Self-pay

## 2020-11-13 ENCOUNTER — Ambulatory Visit (INDEPENDENT_AMBULATORY_CARE_PROVIDER_SITE_OTHER): Payer: Medicare Other | Admitting: Family Medicine

## 2020-11-13 ENCOUNTER — Encounter: Payer: Self-pay | Admitting: Family Medicine

## 2020-11-13 VITALS — BP 124/82 | HR 105 | Ht 66.0 in | Wt 182.0 lb

## 2020-11-13 DIAGNOSIS — M5010 Cervical disc disorder with radiculopathy, unspecified cervical region: Secondary | ICD-10-CM | POA: Diagnosis not present

## 2020-11-13 DIAGNOSIS — M999 Biomechanical lesion, unspecified: Secondary | ICD-10-CM

## 2020-11-13 DIAGNOSIS — I2510 Atherosclerotic heart disease of native coronary artery without angina pectoris: Secondary | ICD-10-CM

## 2020-11-13 NOTE — Assessment & Plan Note (Signed)
Known arthritic changes but seems to be stable.  Mild increase in tightness from patient's baseline needed.  Patient believes doing relatively well.  Discussed icing regimen and home exercise, discussed which activities to do which wants to avoid.  Patient will be traveling we discussed ergonomics and taking breaks.  Follow-up again in 6 to 8 weeks

## 2020-11-13 NOTE — Patient Instructions (Signed)
Glad you are doing better with bronchitis See me again in 6 weeks

## 2020-11-13 NOTE — Progress Notes (Signed)
[place] Phone: [phone] Subjective:   [name], am serving as a scribe for Dr. Hulan Saas. This visit occurred during the SARS-CoV-2 public health emergency.  Safety protocols were in place, including screening questions prior to the visit, additional usage of staff PPE, and extensive cleaning of exam room while observing appropriate contact time as indicated for disinfecting solutions.   I'm seeing this patient by the request  of:  [name], MD  CC: Low back and neck pain follow-up  [id number]  Caroly Stutts Cormany is a 69 y.o. female coming in with complaint of back and neck pain. OMT 10/09/2020. Patient states that she had bronchitis 2 weeks ago. States that her back pain is the same as last visit. Unable to lay on right side as her knee and hip will start hurting but not simultaneously.   Medications patient has been prescribed: Vit D, Zanaflex          Reviewed prior external information including notes and imaging from previsou exam, outside providers and external EMR if available.   As well as notes that were available from care everywhere and other healthcare systems.  Past medical history, social, surgical and family history all reviewed in electronic medical record.  No pertanent information unless stated regarding to the chief complaint.   Past Medical History:  Diagnosis Date  . Acute bursitis of right shoulder 08/29/2018   Right shoulder injected in August 29, 2018  . Allergic rhinitis   . Closed displaced fracture of fifth metatarsal bone of left foot 03/16/2017  . DEPRESSIVE DISORDER NOT ELSEWHERE CLASSIFIED 03/27/2010   Qualifier: Diagnosis of  By: [name] MD, [name]   [name], NP , [facility] [place]    . Diabetes ([place]) 04/02/2016  . Diabetes mellitus   . Essential hypertension 03/14/2008   Qualifier: Diagnosis of  By: [name] MD,  [name]    . Fibromyalgia 08/12/2011   Diagnosed by [name] , Rheumatologist 2010   . GERD (gastroesophageal reflux disease) 12/25/2016  . Gluteal tendonitis of right buttock 10/19/2017   Injected in October 19, 2017 Injected October 07, 2018  . Greater trochanteric bursitis of left hip 06/17/2016  . Greater trochanteric bursitis of right hip 11/27/2015   Injected 11/27/2015   . History of recurrent UTIs    [name]  . HTN (hypertension)   . Hx of osteopenia 10/11/2012   [facility]EXA; ordered by [name] Lowest T score: -2.1 @ lumbar spine @ [facility] 11/01/12; decrease of 8.5% vs 05/2009. There is 9.2% risk over 10 yrs History fracture left elbow @ 4 Fractured left wrist , right elbow and nose post fall July/18/2013. Dr. [name] No FH Osteoporosis No PMH of bisphosphonate therapy (generic Fosamax Rxed but not filled  due to polypharmacy )   . Hyperlipidemia   . Hyperlipidemia associated with type 2 diabetes mellitus ([place]) 01/18/2007   Qualifier: Diagnosis of  By: [name]  With DM LDL goal = < 100, ideally < 70. Mi in father @ 7; 2 brothers @ 69 & 44    . Lumbar radiculopathy 04/11/2008   Qualifier: Diagnosis of  By: [name] MD, Erick Blinks well to epidural 01/12/2017  repeat epidural given May 2018  . Migraine headache    quiescent  . Nonallopathic lesion of cervical region 07/19/2014  . Nonallopathic lesion of lumbosacral region 01/15/2016  . Nonallopathic lesion of sacral region 06/27/2018  .  Nonallopathic lesion-rib cage 09/26/2014  . Palpitations 04/15/2011  . Rotator cuff impingement syndrome of left shoulder 05/11/2014  . Seasonal allergic rhinitis 03/21/2012  . Sinusitis, chronic 04/20/2016  . Sleep disorder    [name] May  . Subluxation of peroneal tendon of right foot 11/10/2017  . TIA (transient ischemic attack)   . TRANSIENT ISCHEMIC ATTACKS, HX OF 02/02/2008   Qualifier: Diagnosis of  By: [name]    . UNS ADVRS EFF UNS RX MEDICINAL&BIOLOGICAL SBSTNC 02/02/2008    Qualifier: Diagnosis of  By: [name] MD, [name]    . UNSPECIFIED MYALGIA AND MYOSITIS 02/02/2008   Qualifier: Diagnosis of  By: [name] MD, [name]    . UTI (urinary tract infection) 10/05/2014  . Viral upper respiratory tract infection with cough 12/31/2014  . Vitamin D deficiency 05/25/2008   Qualifier: Diagnosis of  By: [name] MD, [name]      Allergies  Allergen Reactions  . Vioxx [Rofecoxib] Other (See Comments)    Excess BP which caused TIA   . Prednisone Other (See Comments)    Mental status changes with high-dose oral agent. Tolerates epidural steroid injections. No associated rash or fever  . Augmentin [Amoxicillin-Pot Clavulanate]     Diarrhea  . Macrodantin [Nitrofurantoin Macrocrystal] Hives  . Sulfa Antibiotics     Hives  . Colesevelam Other (See Comments)    Leg Pain     Review of Systems:  No headache, visual changes, nausea, vomiting, diarrhea, constipation, dizziness, abdominal pain, skin rash, fevers, chills, night sweats, weight loss, swollen lymph nodes,  joint swelling, chest pain, shortness of breath, mood changes. POSITIVE muscle aches, body aches  Objective  Blood pressure 124/82, pulse (!) 105, height 5\' 6"  (1.676 m), weight 182 lb (82.6 kg), SpO2 99 %.   General: No apparent distress alert and oriented x3 mood and affect normal, dressed appropriately.  HEENT: Pupils equal, extraocular movements intact  Respiratory: Patient's speak in full sentences and does not appear short of breath  Cardiovascular: No lower extremity edema, non tender, no erythema    Neck exam does show some mild loss of lordosis.  Patient does have some limited sidebending.  Negative Spurling's.  Patient does have tenderness to palpation in the parascapular region right greater than left.  Low back exam does show some mild tightness of the lumbar spine with negative straight leg test but tightness with hamstring greater than left.  Mild tightness with FABER test.  Neurovascular intact  distally with 5 out of 5 strength  Osteopathic findings  C6 flexed rotated and side bent left T3 extended rotated and side bent right inhaled rib T8 extended rotated and side bent left L4 flexed rotated and side bent left Sacrum right on right       Assessment and Plan: Cervical disc disorder with radiculopathy of cervical region Known arthritic changes but seems to be stable.  Mild increase in tightness from patient's baseline needed.  Patient believes doing relatively well.  Discussed icing regimen and home exercise, discussed which activities to do which wants to avoid.  Patient will be traveling we discussed ergonomics and taking breaks.  Follow-up again in 6 to 8 weeks    Nonallopathic problems  Decision today to treat with OMT was based on Physical Exam  After verbal consent patient was treated with HVLA, ME, FPR techniques in cervical, rib, thoracic, lumbar, and sacral  areas  Patient tolerated the procedure well with improvement in symptoms  Patient given exercises, stretches and lifestyle modifications  See  medications in patient instructions if given  Patient will follow up in 4-8 weeks     The above documentation has been reviewed and is accurate and complete Lyndal Pulley, DO       Note: This dictation was prepared with Dragon dictation along with smaller phrase technology. Any transcriptional errors that result from this process are unintentional.

## 2020-11-26 ENCOUNTER — Other Ambulatory Visit: Payer: Self-pay | Admitting: Family Medicine

## 2020-11-26 DIAGNOSIS — F339 Major depressive disorder, recurrent, unspecified: Secondary | ICD-10-CM | POA: Diagnosis not present

## 2020-11-26 DIAGNOSIS — F410 Panic disorder [episodic paroxysmal anxiety] without agoraphobia: Secondary | ICD-10-CM | POA: Diagnosis not present

## 2020-11-27 DIAGNOSIS — Z23 Encounter for immunization: Secondary | ICD-10-CM | POA: Diagnosis not present

## 2020-12-24 NOTE — Progress Notes (Signed)
[place] [address] [name] Phone: [phone] Subjective:   I [name] am serving as a Education administrator for Dr. Hulan Saas.  This visit occurred during the SARS-CoV-2 public health emergency.  Safety protocols were in place, including screening questions prior to the visit, additional usage of staff PPE, and extensive cleaning of exam room while observing appropriate contact time as indicated for disinfecting solutions.   I'm seeing this patient by the request  of:  [name], MD  CC: Neck and back pain follow-up  [id number]  [name] is a 70 y.o. female coming in with complaint of back and neck pain. OMT 11/13/2020. Patient states she went to [facility] for christmas and walked a lot of stairs. States she is doing better today and she has pain with certain ADLs. Laying on her back at night is also painful.  Patient states when she was walking in [place] was doing more walking than usual and did have some pain going even to her foot.  Has been a while since she has had that type of pain.  Also pain on the anterior aspect of the hip.  No significant groin pain now but may be had some intermittent while she was doing the walking.  Patient states that she is been home some mild improvement of some things  Medications patient has been prescribed: Zanaflex, Vit D  Taking: Yes         Reviewed prior external information including notes and imaging from previsou exam, outside providers and external EMR if available.   As well as notes that were available from care everywhere and other healthcare systems.  Past medical history, social, surgical and family history all reviewed in electronic medical record.  No pertanent information unless stated regarding to the chief complaint.   Past Medical History:  Diagnosis Date  . Acute bursitis of right shoulder 08/29/2018   Right shoulder injected in August 29, 2018   . Allergic rhinitis   . Closed displaced fracture of fifth metatarsal bone of left foot 03/16/2017  . DEPRESSIVE DISORDER NOT ELSEWHERE CLASSIFIED 03/27/2010   Qualifier: Diagnosis of  By: [name] MD, [name]   Mr [place] Manila, NP , [facility] [place] [place]    . Diabetes ([place]) 04/02/2016  . Diabetes mellitus   . Essential hypertension 03/14/2008   Qualifier: Diagnosis of  By: [name] MD, [name]    . Fibromyalgia 08/12/2011   Diagnosed by [name] , Rheumatologist 2010   . GERD (gastroesophageal reflux disease) 12/25/2016  . Gluteal tendonitis of right buttock 10/19/2017   Injected in October 19, 2017 Injected October 07, 2018  . Greater trochanteric bursitis of left hip 06/17/2016  . Greater trochanteric bursitis of right hip 11/27/2015   Injected 11/27/2015   . History of recurrent UTIs    [name]  . HTN (hypertension)   . Hx of osteopenia 10/11/2012   [facility]A; ordered by [name] Lowest T score: -2.1 @ lumbar spine @ [facility] 11/01/12; decrease of 8.5% vs 05/2009. There is 9.2% risk over 10 yrs History fracture left elbow @ 4 Fractured left wrist , right elbow and nose post fall July/18/2013. Dr. [name] No FH Osteoporosis No PMH of bisphosphonate therapy (generic Fosamax Rxed but not filled  due to polypharmacy )   . Hyperlipidemia   . Hyperlipidemia associated with type 2 diabetes mellitus ([place]) 01/18/2007   Qualifier: Diagnosis of  By: [name]  With DM  LDL goal = < 100, ideally < 70. Mi in father @ 85; 2 brothers @ 64 & 43    . Lumbar radiculopathy 04/11/2008   Qualifier: Diagnosis of  By: [name] MD, Erick Blinks well to epidural 01/12/2017  repeat epidural given May 2018  . Migraine headache    quiescent  . Nonallopathic lesion of cervical region 07/19/2014  . Nonallopathic lesion of lumbosacral region 01/15/2016  . Nonallopathic lesion of sacral region 06/27/2018  . Nonallopathic lesion-rib cage 09/26/2014  . Palpitations 04/15/2011  . Rotator cuff impingement  syndrome of left shoulder 05/11/2014  . Seasonal allergic rhinitis 03/21/2012  . Sinusitis, chronic 04/20/2016  . Sleep disorder    [name] May  . Subluxation of peroneal tendon of right foot 11/10/2017  . TIA (transient ischemic attack)   . TRANSIENT ISCHEMIC ATTACKS, HX OF 02/02/2008   Qualifier: Diagnosis of  By: [name]    . UNS ADVRS EFF UNS RX MEDICINAL&BIOLOGICAL SBSTNC 02/02/2008   Qualifier: Diagnosis of  By: [name] MD, [name]    . UNSPECIFIED MYALGIA AND MYOSITIS 02/02/2008   Qualifier: Diagnosis of  By: [name] MD, [name]    . UTI (urinary tract infection) 10/05/2014  . Viral upper respiratory tract infection with cough 12/31/2014  . Vitamin D deficiency 05/25/2008   Qualifier: Diagnosis of  By: [name] MD, [name]      Allergies  Allergen Reactions  . Vioxx [Rofecoxib] Other (See Comments)    Excess BP which caused TIA   . Prednisone Other (See Comments)    Mental status changes with high-dose oral agent. Tolerates epidural steroid injections. No associated rash or fever  . Augmentin [Amoxicillin-Pot Clavulanate]     Diarrhea  . Macrodantin [Nitrofurantoin Macrocrystal] Hives  . Sulfa Antibiotics     Hives  . Colesevelam Other (See Comments)    Leg Pain     Review of Systems:  No headache, visual changes, nausea, vomiting, diarrhea, constipation, dizziness, abdominal pain, skin rash, fevers, chills, night sweats, weight loss, swollen lymph nodes,  joint swelling, chest pain, shortness of breath, mood changes. POSITIVE muscle aches, body aches  Objective  Blood pressure 130/82, pulse 91, height 5\' 6"  (1.676 m), weight 181 lb (82.1 kg), SpO2 98 %.   General: No apparent distress alert and oriented x3 mood and affect normal, dressed appropriately.  HEENT: Pupils equal, extraocular movements intact  Respiratory: Patient's speak in full sentences and does not appear short of breath  Cardiovascular: No lower extremity edema, non tender, no erythema  Neck exam does  have significant loss of lordosis.  Patient is tender to palpation in multiple different areas of the soft tissue.  Negative Spurling's.  5-5 strength of the upper extremities  Low back exam does have mild tightness noted right greater than left to the sacroiliac joint.  Patient does have a positive Corky Sox.  Some pain over the tensor fascia lata.  Good range of motion of the hip.  Moderate tenderness over the greater trochanteric area.   Osteopathic findings  C6 flexed rotated and side bent left T5 extended rotated and side bent right inhaled rib L3 flexed rotated and side bent left Sacrum right on right       Assessment and Plan:  Cervical disc disorder with radiculopathy of cervical region Known degenerative disc that has responded very well to the epidurals previously.  Had some more tightness noted.No radicular symptoms patient responding well to manipulation.  Muscle energy.  Follow-up again in 6 to 8 weeks  Lumbar radiculopathy, right Mild worsening of symptoms at this time.  Patient had more walking when she was out of the state.  Likely more of just an exacerbation but had not had x-rays for greater than 4 years.  We will get these with history of osteopenia.  Patient does not need any pain medication.  Continue the home exercise.  Responded well to osteopathic manipulation and follow-up again in 6 to 8 weeks    Nonallopathic problems  Decision today to treat with OMT was based on Physical Exam  After verbal consent patient was treated with HVLA, ME, FPR techniques in cervical, rib, thoracic, lumbar, and sacral  areas  Patient tolerated the procedure well with improvement in symptoms  Patient given exercises, stretches and lifestyle modifications  See medications in patient instructions if given  Patient will follow up in 6-8 weeks      The above documentation has been reviewed and is accurate and complete Lyndal Pulley, DO       Note: This dictation was  prepared with Dragon dictation along with smaller phrase technology. Any transcriptional errors that result from this process are unintentional.

## 2020-12-25 ENCOUNTER — Other Ambulatory Visit: Payer: Self-pay

## 2020-12-25 ENCOUNTER — Ambulatory Visit (INDEPENDENT_AMBULATORY_CARE_PROVIDER_SITE_OTHER): Payer: Medicare Other

## 2020-12-25 ENCOUNTER — Encounter: Payer: Self-pay | Admitting: Family Medicine

## 2020-12-25 ENCOUNTER — Other Ambulatory Visit: Payer: Self-pay | Admitting: Family Medicine

## 2020-12-25 ENCOUNTER — Ambulatory Visit (INDEPENDENT_AMBULATORY_CARE_PROVIDER_SITE_OTHER): Payer: Medicare Other | Admitting: Family Medicine

## 2020-12-25 VITALS — BP 130/82 | HR 91 | Ht 66.0 in | Wt 181.0 lb

## 2020-12-25 DIAGNOSIS — G8929 Other chronic pain: Secondary | ICD-10-CM

## 2020-12-25 DIAGNOSIS — M999 Biomechanical lesion, unspecified: Secondary | ICD-10-CM | POA: Diagnosis not present

## 2020-12-25 DIAGNOSIS — M545 Low back pain, unspecified: Secondary | ICD-10-CM

## 2020-12-25 DIAGNOSIS — M5010 Cervical disc disorder with radiculopathy, unspecified cervical region: Secondary | ICD-10-CM

## 2020-12-25 DIAGNOSIS — M5416 Radiculopathy, lumbar region: Secondary | ICD-10-CM | POA: Diagnosis not present

## 2020-12-25 DIAGNOSIS — M25551 Pain in right hip: Secondary | ICD-10-CM | POA: Diagnosis not present

## 2020-12-25 NOTE — Patient Instructions (Addendum)
Good to see you Back xray today Right hip xray as well Get back to routine See me again in 6-8 weeks

## 2020-12-25 NOTE — Assessment & Plan Note (Signed)
Known degenerative disc that has responded very well to the epidurals previously.  Had some more tightness noted.No radicular symptoms patient responding well to manipulation.  Muscle energy.  Follow-up again in 6 to 8 weeks

## 2020-12-25 NOTE — Assessment & Plan Note (Signed)
Mild worsening of symptoms at this time.  Patient had more walking when she was out of the state.  Likely more of just an exacerbation but had not had x-rays for greater than 4 years.  We will get these with history of osteopenia.  Patient does not need any pain medication.  Continue the home exercise.  Responded well to osteopathic manipulation and follow-up again in 6 to 8 weeks

## 2021-01-07 ENCOUNTER — Other Ambulatory Visit: Payer: Self-pay

## 2021-01-09 ENCOUNTER — Ambulatory Visit (INDEPENDENT_AMBULATORY_CARE_PROVIDER_SITE_OTHER): Payer: Medicare Other | Admitting: Endocrinology

## 2021-01-09 ENCOUNTER — Other Ambulatory Visit: Payer: Self-pay

## 2021-01-09 VITALS — BP 150/84 | HR 105 | Ht 66.0 in | Wt 179.8 lb

## 2021-01-09 DIAGNOSIS — Z794 Long term (current) use of insulin: Secondary | ICD-10-CM

## 2021-01-09 DIAGNOSIS — E1151 Type 2 diabetes mellitus with diabetic peripheral angiopathy without gangrene: Secondary | ICD-10-CM | POA: Diagnosis not present

## 2021-01-09 LAB — POCT GLYCOSYLATED HEMOGLOBIN (HGB A1C): Hemoglobin A1C: 7.7 % — AB (ref 4.0–5.6)

## 2021-01-09 MED ORDER — BASAGLAR KWIKPEN 100 UNIT/ML ~~LOC~~ SOPN: 55.0000 [IU] | PEN_INJECTOR | SUBCUTANEOUS | 11 refills | Status: DC

## 2021-01-09 NOTE — Patient Instructions (Addendum)
Your blood pressure is high today.  Please see your primary care provider soon, to have it rechecked check your blood sugar twice a day.  vary the time of day when you check, between before the 3 meals, and at bedtime.  also check if you have symptoms of your blood sugar being too high or too low.  please keep a record of the readings and bring it to your next appointment here.  You can write it on any piece of paper.  please call us sooner if your blood sugar goes below 70, or if you have a lot of readings over 200.   Please reduce the basaglar to 55 units each morning.   Please let us know if the Ozempic is approved.  Please come back for a follow-up appointment in 3 months.

## 2021-01-09 NOTE — Progress Notes (Signed)
Subjective:    Patient ID: Christine [name], female    DOB: Mar 04, 1951, 70 y.o.   MRN: [id number]  HPI Pt returns for f/u of diabetes mellitus: DM type: Insulin-requiring type 2 Dx'ed: [id number] Complications: TIA Therapy: insulin since 2014, and pioglitizone.   GDM: never. DKA: never.  Severe hypoglycemia: never.  Pancreatitis: never.   Other: She declines multiple daily injections; pioglitizone was for NASH, but she stopped due to edema; a trial to convert back to oral rx in 2017 was unsuccessful; she declines additional DM rx.   Interval history: she brings a record of her cbg's which I have reviewed today.  It varies from 63-200.  It is in general higher as the day goes on.  pt states she feels well in general.  She takes 60 units each morning.  She is awaiting pt assist for Ozempic.   Past Medical History:  Diagnosis Date  . Acute bursitis of right shoulder 08/29/2018   Right shoulder injected in August 29, 2018  . Allergic rhinitis   . Closed displaced fracture of fifth metatarsal bone of left foot 03/16/2017  . DEPRESSIVE DISORDER NOT ELSEWHERE CLASSIFIED 03/27/2010   Qualifier: Diagnosis of  By: [name] MD, [name]   [name], NP , [facility] [place]    . Diabetes ([place]) 04/02/2016  . Diabetes mellitus   . Essential hypertension 03/14/2008   Qualifier: Diagnosis of  By: [name] MD, [name]    . Fibromyalgia 08/12/2011   Diagnosed by [name] , Rheumatologist 2010   . GERD (gastroesophageal reflux disease) 12/25/2016  . Gluteal tendonitis of right buttock 10/19/2017   Injected in October 19, 2017 Injected October 07, 2018  . Greater trochanteric bursitis of left hip 06/17/2016  . Greater trochanteric bursitis of right hip 11/27/2015   Injected 11/27/2015   . History of recurrent UTIs    [name]  . HTN (hypertension)   . Hx of osteopenia 10/11/2012   [facility] DEXA; ordered by [name] Lowest T score: -2.1 @ lumbar spine @ [facility] 11/01/12; decrease of  8.5% vs 05/2009. There is 9.2% risk over 10 yrs History fracture left elbow @ 4 Fractured left wrist , right elbow and nose post fall July/18/2013. Dr. [name] No FH Osteoporosis No PMH of bisphosphonate therapy (generic Fosamax Rxed but not filled  due to polypharmacy )   . Hyperlipidemia   . Hyperlipidemia associated with type 2 diabetes mellitus ([place]) 01/18/2007   Qualifier: Diagnosis of  By: [name]  With DM LDL goal = < 100, ideally < 70. Mi in father @ 71; 2 brothers @ 30 & 8    . Lumbar radiculopathy 04/11/2008   Qualifier: Diagnosis of  By: [name] MD, Erick Blinks well to epidural 01/12/2017  repeat epidural given May 2018  . Migraine headache    quiescent  . Nonallopathic lesion of cervical region 07/19/2014  . Nonallopathic lesion of lumbosacral region 01/15/2016  . Nonallopathic lesion of sacral region 06/27/2018  . Nonallopathic lesion-rib cage 09/26/2014  . Palpitations 04/15/2011  . Rotator cuff impingement syndrome of left shoulder 05/11/2014  . Seasonal allergic rhinitis 03/21/2012  . Sinusitis, chronic 04/20/2016  . Sleep disorder    [name] May  . Subluxation of peroneal tendon of right foot 11/10/2017  . TIA (transient ischemic attack)   . TRANSIENT ISCHEMIC ATTACKS, HX OF 02/02/2008   Qualifier: Diagnosis of  By: [name]    . UNS ADVRS EFF UNS RX MEDICINAL&BIOLOGICAL SBSTNC  02/02/2008   Qualifier: Diagnosis of  By: [name] MD, [name]    . UNSPECIFIED MYALGIA AND MYOSITIS 02/02/2008   Qualifier: Diagnosis of  By: [name] MD, [name]    . UTI (urinary tract infection) 10/05/2014  . Viral upper respiratory tract infection with cough 12/31/2014  . Vitamin D deficiency 05/25/2008   Qualifier: Diagnosis of  By: [name] MD, [name]      Past Surgical History:  Procedure Laterality Date  . COLONOSCOPY     Dr Carlean [name]; hemorrhoids  . CORONARY ANGIOPLASTY  1997   for chest pain- negative   . POLYPECTOMY  2002   benign, hyperplastic polyp; rectal bleeding 2008  .  TONSILLECTOMY    . TOTAL ABDOMINAL HYSTERECTOMY  1983   BSO for endometriosis  . WISDOM TOOTH EXTRACTION      Social History   Socioeconomic History  . Marital status: Married    Spouse name: Not on file  . Number of children: Not on file  . Years of education: Not on file  . Highest education level: Not on file  Occupational History  . 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 Smoking status: Never Smoker  . Smokeless tobacco: Never Used  Vaping Use  . Vaping Use: Never used  Substance and Sexual Activity  . Alcohol use: No  . Drug use: No  . Sexual activity: Not on file  Other Topics Concern  . Not on file  Social History Narrative   Regular exercise- no    Social Determinants of Health   Financial Resource Strain: Low Risk   . Difficulty of Paying Living Expenses: Not very hard  Food Insecurity: Not on file  Transportation Needs: Not on file  Physical Activity: Not on file  Stress: Not on file  Social Connections: Not on file  Intimate Partner Violence: Not on file    Current Outpatient Medications on File Prior to Visit  Medication Sig Dispense Refill  . aspirin 81 MG tablet Take 81 mg by mouth daily.    [name] atorvastatin (LIPITOR) 80 MG tablet Take 1 tablet (80 mg total) by mouth daily. (Patient taking differently: Take 40 mg by mouth daily.) 90 tablet 3  . BD PEN NEEDLE NANO 2ND GEN 32G X 4 MM MISC USE ONCE A DAY AS DIRECTED (Patient taking differently: 1 each by Other route daily. E11.9) 90 each 3  . busPIRone (BUSPAR) 15 MG tablet Take 15 mg by mouth 2 (two) times daily.    . DULoxetine (CYMBALTA) 60 MG capsule Take 60 mg by mouth daily.    . famotidine (PEPCID) 20 MG tablet Take 1 tablet (20 mg total) by mouth 2 (two) times daily. 14 tablet 0  . fluconazole (DIFLUCAN) 150 MG tablet Take 1 tablet today as directed; repeat in 72 hours 2 tablet 0  . hydrOXYzine (ATARAX/VISTARIL) 25 MG tablet Take 1 tablet (25 mg  total) by mouth every 6 (six) hours. 15 tablet 0  . Lancets (ONETOUCH ULTRASOFT) lancets Check blood sugar once daily. Dx code: 250.00 100 each 12  . LORazepam (ATIVAN) 0.5 MG tablet Take 0.5 mg by mouth 2 (two) times daily as needed. For anxiety.    [name] losartan (COZAAR) 100 MG tablet Take 1 tablet (100 mg total) by mouth daily. 90 tablet 3  . metoprolol tartrate (LOPRESSOR) 100 MG tablet Take one tablet by mouth 2 hours prior to your CT 1 tablet 0  . naproxen (NAPROSYN) 500 MG tablet  TAKE 1 TABLET (500 MG TOTAL) BY MOUTH 2 (TWO) TIMES DAILY WITH A MEAL. 180 tablet 1  . omeprazole (PRILOSEC) 20 MG capsule Take 1 capsule (20 mg total) by mouth daily. 90 capsule 3  . ONETOUCH ULTRA test strip USE 1 STRIP TWICE DAILY DX CODE E11.65 100 strip 3  . predniSONE (DELTASONE) 20 MG tablet Take 1 tablet (20 mg total) by mouth daily with breakfast. 5 tablet 0  . promethazine (PHENERGAN) 12.5 MG tablet Take 1 tablet (12.5 mg total) by mouth every 8 (eight) hours as needed for nausea or vomiting. 20 tablet 8  . Semaglutide (OZEMPIC, 0.25 OR 0.5 MG/DOSE, [place]) Inject 0.25 mg into the skin once a week.     [name] tiZANidine (ZANAFLEX) 2 MG tablet TAKE 1 TABLET BY MOUTH EVERY 8 HOURS AS NEEDED FOR MUSCLE SPASMS 60 tablet 0  . traMADol (ULTRAM) 50 MG tablet Take 1 tablet (50 mg total) by mouth every 6 (six) hours as needed. 20 tablet 0  . traZODone (DESYREL) 50 MG tablet TAKE 1/2 TO 1 TABLET BY MOUTH AT BEDTIME AS NEEDED FOR SLEEP 90 tablet 1  . Vitamin D, Ergocalciferol, (DRISDOL) 1.25 MG (50000 UNIT) CAPS capsule TAKE 1 CAPSULE BY MOUTH ONE TIME PER WEEK 12 capsule 0   No current facility-administered medications on file prior to visit.    Allergies  Allergen Reactions  . Vioxx [Rofecoxib] Other (See Comments)    Excess BP which caused TIA   . Prednisone Other (See Comments)    Mental status changes with high-dose oral agent. Tolerates epidural steroid injections. No associated rash or fever  . Augmentin  [Amoxicillin-Pot Clavulanate]     Diarrhea  . Macrodantin [Nitrofurantoin Macrocrystal] Hives  . Sulfa Antibiotics     Hives  . Colesevelam Other (See Comments)    Leg Pain    Family History  Problem Relation Age of Onset  . Colon cancer Maternal Aunt   . Diabetes Paternal Uncle   . Heart attack Father 9  . COPD Mother   . Lung cancer Brother        smoker  . Mental illness Other        niece, committed suicide.  [name] Heart attack Other        brother X 2; @ 67 & 59  . Stroke Neg Hx     BP (!) 150/84 (BP Location: Right Arm, Patient Position: Sitting, Cuff Size: Normal)   Pulse (!) 105   Ht 5\' 6"  (1.676 m)   Wt 179 lb 12.8 oz (81.6 kg)   LMP  (LMP Unknown)   SpO2 93%   BMI 29.02 kg/m    Review of Systems     Objective:   Physical Exam VITAL SIGNS:  See vs page GENERAL: no distress Pulses: dorsalis pedis intact bilat.   MSK: no deformity of the feet CV: no leg edema Skin:  no ulcer on the feet.  normal color and temp on the feet.   Neuro: sensation is intact to touch on the feet.    Lab Results  Component Value Date   CREATININE 0.66 07/12/2020   BUN 15 07/12/2020   NA 140 07/12/2020   K 3.9 07/12/2020   CL 104 07/12/2020   CO2 29 07/12/2020    Lab Results  Component Value Date   HGBA1C 7.7 (A) 01/09/2021       Assessment & Plan:  HTN: is noted today Insulin-requiring type 2 DM, with TIA Hypoglycemia, due to insulin: this  limits aggressiveness of glycemic control   Patient Instructions  Your blood pressure is high today.  Please see your primary care provider soon, to have it rechecked check your blood sugar twice a day.  vary the time of day when you check, between before the 3 meals, and at bedtime.  also check if you have symptoms of your blood sugar being too high or too low.  please keep a record of the readings and bring it to your next appointment here.  You can write it on any piece of paper.  please call us sooner if your blood sugar goes  below 70, or if you have a lot of readings over 200.   Please reduce the basaglar to 55 units each morning.   Please let us know if the Ozempic is approved.  Please come back for a follow-up appointment in 3 months.

## 2021-01-10 ENCOUNTER — Telehealth: Payer: Self-pay | Admitting: *Deleted

## 2021-01-10 NOTE — Telephone Encounter (Signed)
Re-faxed [facility] section [phone]

## 2021-01-22 DIAGNOSIS — F410 Panic disorder [episodic paroxysmal anxiety] without agoraphobia: Secondary | ICD-10-CM | POA: Diagnosis not present

## 2021-01-22 DIAGNOSIS — F339 Major depressive disorder, recurrent, unspecified: Secondary | ICD-10-CM | POA: Diagnosis not present

## 2021-01-27 ENCOUNTER — Ambulatory Visit (INDEPENDENT_AMBULATORY_CARE_PROVIDER_SITE_OTHER): Payer: Medicare Other | Admitting: Internal Medicine

## 2021-01-27 ENCOUNTER — Encounter: Payer: Self-pay | Admitting: Internal Medicine

## 2021-01-27 ENCOUNTER — Other Ambulatory Visit: Payer: Self-pay

## 2021-01-27 VITALS — BP 128/90 | HR 96 | Temp 98.4°F | Resp 18 | Ht 66.0 in | Wt 179.0 lb

## 2021-01-27 DIAGNOSIS — E785 Hyperlipidemia, unspecified: Secondary | ICD-10-CM

## 2021-01-27 DIAGNOSIS — E1169 Type 2 diabetes mellitus with other specified complication: Secondary | ICD-10-CM | POA: Diagnosis not present

## 2021-01-27 DIAGNOSIS — R8290 Unspecified abnormal findings in urine: Secondary | ICD-10-CM | POA: Diagnosis not present

## 2021-01-27 DIAGNOSIS — Z1231 Encounter for screening mammogram for malignant neoplasm of breast: Secondary | ICD-10-CM | POA: Diagnosis not present

## 2021-01-27 DIAGNOSIS — R3989 Other symptoms and signs involving the genitourinary system: Secondary | ICD-10-CM

## 2021-01-27 DIAGNOSIS — Z8739 Personal history of other diseases of the musculoskeletal system and connective tissue: Secondary | ICD-10-CM

## 2021-01-27 DIAGNOSIS — E2839 Other primary ovarian failure: Secondary | ICD-10-CM | POA: Diagnosis not present

## 2021-01-27 LAB — POCT URINALYSIS DIPSTICK
Bilirubin, UA: NEGATIVE
Blood, UA: POSITIVE
Glucose, UA: NEGATIVE
Ketones, UA: NEGATIVE
Leukocytes, UA: NEGATIVE
Nitrite, UA: NEGATIVE
Protein, UA: NEGATIVE
Spec Grav, UA: 1.01 (ref 1.010–1.025)
Urobilinogen, UA: 0.2 E.U./dL
pH, UA: 6 (ref 5.0–8.0)

## 2021-01-27 LAB — LIPID PANEL
Cholesterol: 164 mg/dL (ref 0–200)
HDL: 45.6 mg/dL (ref >39.00)
LDL Cholesterol: 96 mg/dL (ref 0–99)
NonHDL: 118.28
Total CHOL/HDL Ratio: 4
Triglycerides: 112 mg/dL (ref 0.0–149.0)
VLDL: 22.4 mg/dL (ref 0.0–40.0)

## 2021-01-27 MED ORDER — ATORVASTATIN CALCIUM 40 MG PO TABS: 40.0000 mg | ORAL_TABLET | Freq: Every day | ORAL | 3 refills | Status: DC

## 2021-01-27 MED ORDER — CEPHALEXIN 250 MG PO CAPS: 250.0000 mg | ORAL_CAPSULE | Freq: Every day | ORAL | 1 refills | Status: DC

## 2021-01-27 NOTE — Progress Notes (Signed)
   Subjective:   Patient ID: Christine [name], female    DOB: 1951-11-23, 70 y.o.   MRN: [id number]  HPI The patient is a 70 YO female coming in for concerns about urinary odor (persistent and seen by urology in the fall, given about 45 day course of antibiotics at low dose, this did help while taking and then recurred within days of stopping, no fevers or chills, this is very embarrassing to her and limiting for her life) and osteopenia (last D[facility]A at [facility] 2016, she has some concerns about low bone density on recent x-rays with sports medicine and wants to reassess overall bone health, no recent fractures) and cholesterol (we had increased dosing of lipitor to 80 mg daily but this gave her muscle and joint pains so decreased back to 40 mg daily several months ago, now having no side effects).   Review of Systems  Constitutional: Negative.   HENT: Negative.   Eyes: Negative.   Respiratory: Negative for cough, chest tightness and shortness of breath.   Cardiovascular: Negative for chest pain, palpitations and leg swelling.  Gastrointestinal: Negative for abdominal distention, abdominal pain, constipation, diarrhea, nausea and vomiting.  Genitourinary:       Urinary odor  Musculoskeletal: Negative.   Skin: Negative.   Neurological: Negative.   Psychiatric/Behavioral: Negative.     Objective:  Physical Exam Constitutional:      Appearance: She is well-developed and well-nourished.  HENT:     Head: Normocephalic and atraumatic.  Eyes:     Extraocular Movements: EOM normal.  Cardiovascular:     Rate and Rhythm: Normal rate and regular rhythm.  Pulmonary:     Effort: Pulmonary effort is normal. No respiratory distress.     Breath sounds: Normal breath sounds. No wheezing or rales.  Abdominal:     General: Bowel sounds are normal. There is no distension.     Palpations: Abdomen is soft.     Tenderness: There is no abdominal tenderness. There is no rebound.  Musculoskeletal:         General: No edema.     Cervical back: Normal range of motion.  Skin:    General: Skin is warm and dry.  Neurological:     Mental Status: She is alert and oriented to person, place, and time.     Coordination: Coordination normal.  Psychiatric:        Mood and Affect: Mood and affect normal.     Vitals:   01/27/21 1105  BP: 128/90  Pulse: 96  Resp: 18  Temp: 98.4 F (36.9 C)  TempSrc: Oral  SpO2: 98%  Weight: 179 lb (81.2 kg)  Height: 5\' 6"  (1.676 m)    This visit occurred during the SARS-CoV-2 public health emergency.  Safety protocols were in place, including screening questions prior to the visit, additional usage of staff PPE, and extensive cleaning of exam room while observing appropriate contact time as indicated for disinfecting solutions.   Assessment & Plan:

## 2021-01-27 NOTE — Assessment & Plan Note (Signed)
Ordered DEXA today to assess for change.

## 2021-01-27 NOTE — Patient Instructions (Addendum)
We have sent in the keflex to take 1 pill twice a day for 1 week, then keep taking this daily for 3 months.   Make sure to get the booster for covid-19. Ask about the shingles vaccine shingrix.  We have sent in the lipitor 40 mg for you.

## 2021-01-27 NOTE — Assessment & Plan Note (Signed)
POC U/A done in the office without signs of UTI. Will treat empirically with keflex BID for 1 week then daily for 3 months to treat colonization. We talked about risk of GI problems from long term antibiotic usage.

## 2021-01-27 NOTE — Assessment & Plan Note (Signed)
Checking lipid panel and new rx for lipitor 40 mg daily.

## 2021-01-28 ENCOUNTER — Other Ambulatory Visit: Payer: Self-pay | Admitting: Family Medicine

## 2021-01-28 ENCOUNTER — Other Ambulatory Visit: Payer: Self-pay | Admitting: Internal Medicine

## 2021-01-28 ENCOUNTER — Other Ambulatory Visit: Payer: Self-pay

## 2021-01-28 MED ORDER — TIZANIDINE HCL 2 MG PO TABS: 2.0000 mg | ORAL_TABLET | Freq: Every day | ORAL | 1 refills | Status: DC

## 2021-01-29 ENCOUNTER — Other Ambulatory Visit: Payer: Self-pay | Admitting: Internal Medicine

## 2021-01-29 NOTE — Telephone Encounter (Signed)
Medication is on back order. Patient needs a alternative.

## 2021-01-31 ENCOUNTER — Ambulatory Visit (INDEPENDENT_AMBULATORY_CARE_PROVIDER_SITE_OTHER): Payer: Medicare Other | Admitting: Pharmacist

## 2021-01-31 ENCOUNTER — Telehealth: Payer: Medicare Other

## 2021-01-31 DIAGNOSIS — E785 Hyperlipidemia, unspecified: Secondary | ICD-10-CM | POA: Diagnosis not present

## 2021-01-31 DIAGNOSIS — E1169 Type 2 diabetes mellitus with other specified complication: Secondary | ICD-10-CM | POA: Diagnosis not present

## 2021-01-31 DIAGNOSIS — E1151 Type 2 diabetes mellitus with diabetic peripheral angiopathy without gangrene: Secondary | ICD-10-CM | POA: Diagnosis not present

## 2021-01-31 DIAGNOSIS — Z794 Long term (current) use of insulin: Secondary | ICD-10-CM

## 2021-01-31 DIAGNOSIS — I10 Essential (primary) hypertension: Secondary | ICD-10-CM | POA: Diagnosis not present

## 2021-01-31 NOTE — Progress Notes (Signed)
Chronic Care Management Pharmacy Note  01/31/2021 Name:  Christine [name] MRN:  [id number] DOB:  [birth year]  Subjective: Christine [name] is an 70 y.o. year old female who is a primary patient of [name], MD.  The CCM team was consulted for assistance with disease management and care coordination needs.    Engaged with patient by telephone for follow up visit in response to provider referral for pharmacy case management and/or care coordination services.   Consent to Services:  The patient was given the following information about Chronic Care Management services today, agreed to services, and gave verbal consent: 1. CCM service includes personalized support from designated clinical staff supervised by the primary care provider, including individualized plan of care and coordination with other care providers 2. 24/7 contact phone numbers for assistance for urgent and routine care needs. 3. Service will only be billed when office clinical staff spend 20 minutes or more in a month to coordinate care. 4. Only one practitioner may furnish and bill the service in a calendar month. 5.The patient may stop CCM services at any time (effective at the end of the month) by phone call to the office staff. 6. The patient will be responsible for cost sharing (co-pay) of up to 20% of the service fee (after annual deductible is met). Patient agreed to services and consent obtained.  Patient Care Team: [name], MD as PCP - General (Internal Medicine) Belva Crome, MD as PCP - Cardiology (Cardiology) Pieter Partridge, DO as Consulting Physician (Neurology) Charlton Haws, [facility] as Pharmacist (Pharmacist)  Recent office visits: 01/27/21 Dr Sharlet Salina OV: c/o urinary odor, rx'd cephalexin x 3 months, reduced atorvastatin to 40 mg (pt had already done that several months ago d/t side effects with 80 mg). Advised covid booster and Shingrix.  Recent consult visits: 01/09/21 Dr  Loanne Drilling (endocrine)  Hospital visits: None in previous 6 months  Objective:  Lab Results  Component Value Date   CREATININE 0.66 07/12/2020   BUN 15 07/12/2020   GFR 91.48 05/31/2020   GFRNONAA >60 06/09/2020   GFRAA >60 06/09/2020   NA 140 07/12/2020   K 3.9 07/12/2020   CALCIUM 9.3 07/12/2020   CO2 29 07/12/2020    Lab Results  Component Value Date/Time   HGBA1C 7.7 (A) 01/09/2021 11:11 AM   HGBA1C 7.9 (A) 10/08/2020 11:02 AM   HGBA1C 6.6 (H) 05/24/2015 11:37 AM   HGBA1C 8.3 (H) 02/22/2015 11:24 AM   GFR 91.48 05/31/2020 02:20 PM   GFR 86.79 04/23/2020 01:31 PM   MICROALBUR 10 04/23/2020 03:47 PM   MICROALBUR 1.1 11/21/2014 10:40 AM   MICROALBUR 0.6 08/08/2014 12:07 PM    Last diabetic Eye exam:  Lab Results  Component Value Date/Time   HMDIABEYEEXA No Retinopathy 05/18/2018 10:01 AM    Last diabetic Foot exam: No results found for: HMDIABFOOTEX   Lab Results  Component Value Date   CHOL 164 01/27/2021   HDL 45.60 01/27/2021   LDLCALC 96 01/27/2021   TRIG 112.0 01/27/2021   CHOLHDL 4 01/27/2021    Hepatic Function Latest Ref Rng & Units 07/12/2020 05/31/2020 04/23/2020  Total Protein 6.1 - 8.1 g/dL 6.1 6.9 6.9  Albumin 3.5 - 5.2 g/dL - 4.2 4.1  AST 10 - 35 U/L '19 18 17  ' ALT 6 - 29 U/L '20 27 22  ' Alk Phosphatase 39 - 117 U/L - 67 59  Total Bilirubin 0.2 - 1.2 mg/dL 0.4 0.4 0.4  Bilirubin, Direct  0.0 - 0.3 mg/dL - - -    Lab Results  Component Value Date/Time   TSH 2.23 01/05/2020 11:35 AM   TSH 2.50 01/21/2018 07:37 AM   FREET4 0.91 03/13/2011 04:36 PM    CBC Latest Ref Rng & Units 07/12/2020 06/09/2020 04/23/2020  WBC 3.8 - 10.8 Thousand/uL 5.7 7.6 5.3  Hemoglobin 11.7 - 15.5 g/dL 12.0 13.0 12.8  Hematocrit 35.0 - 45.0 % 35.8 40.2 39.1  Platelets 140 - 400 Thousand/uL 208 193 228.0    Lab Results  Component Value Date/Time   VD25OH 48.28 04/23/2020 01:31 PM   VD25OH 46.16 02/08/2019 11:21 AM    Clinical ASCVD: No  The 10-year ASCVD risk score  Mikey Bussing DC Jr., et al., 2013) is: 24.8%   Values used to calculate the score:     Age: 16 years     Sex: Female     Is Non-Hispanic African American: No     Diabetic: Yes     Tobacco smoker: No     Systolic Blood Pressure: 062 mmHg     Is BP treated: Yes     HDL Cholesterol: 45.6 mg/dL     Total Cholesterol: 164 mg/dL    Depression screen [facility] 2/9 04/23/2020 02/08/2019  Decreased Interest 0 0  Down, Depressed, Hopeless 0 0  PHQ - 2 Score 0 0  Some recent data might be hidden     Social History   Tobacco Use  Smoking Status Never Smoker  Smokeless Tobacco Never Used   BP Readings from Last 3 Encounters:  01/27/21 128/90  01/09/21 (!) 150/84  12/25/20 130/82   Pulse Readings from Last 3 Encounters:  01/27/21 96  01/09/21 (!) 105  12/25/20 91   Wt Readings from Last 3 Encounters:  01/27/21 179 lb (81.2 kg)  01/09/21 179 lb 12.8 oz (81.6 kg)  12/25/20 181 lb (82.1 kg)    Assessment/Interventions: Review of patient past medical history, allergies, medications, health status, including review of consultants reports, laboratory and other test data, was performed as part of comprehensive evaluation and provision of chronic care management services.   SDOH:  (Social Determinants of Health) assessments and interventions performed: Yes   CCM Care Plan  Allergies  Allergen Reactions  . Vioxx [Rofecoxib] Other (See Comments)    Excess BP which caused TIA   . Prednisone Other (See Comments)    Mental status changes with high-dose oral agent. Tolerates epidural steroid injections. No associated rash or fever  . Augmentin [Amoxicillin-Pot Clavulanate]     Diarrhea  . Macrodantin [Nitrofurantoin Macrocrystal] Hives  . Sulfa Antibiotics     Hives  . Colesevelam Other (See Comments)    Leg Pain    Medications Reviewed Today    Reviewed by [name], MD (Physician) on 01/27/21 at 1123  Med List Status: <None>  Medication Order Taking? Sig Documenting Provider  Last Dose Status Informant  aspirin 81 MG tablet [id number] Yes Take 81 mg by mouth daily. [provider] Taking Active Self  atorvastatin (LIPITOR) 80 MG tablet [id number] Yes Take 1 tablet (80 mg total) by mouth daily.  Patient taking differently: Take 40 mg by mouth daily.   [name], MD Taking Active            Med Note Lenord Fellers, Cle[place]   Wed Sep 04, 2020  3:21 PM) Dose reduced back to 40 mg due to muscle aches  BD PEN NEEDLE NANO 2ND GEN 32G X 4 MM MISC [id number]  Yes USE ONCE A DAY AS DIRECTED  Patient taking differently: 1 each by Other route daily. E11.9   Renato Shin, MD Taking Active   DULoxetine (CYMBALTA) 60 MG capsule [id number] Yes Take 60 mg by mouth daily. [provider] Taking Active   Insulin Glargine [facility] KWIKPEN) 100 UNIT/ML [id number] Yes Inject 55 Units into the skin every morning. Renato Shin, MD Taking Active   Lancets [facility] ULTRASOFT) lancets [id number] Yes Check blood sugar once daily. Dx code: 70.00 Hendricks Limes, MD Taking Active   LORazepam (ATIVAN) 0.5 MG tablet [id number] Yes Take 0.5 mg by mouth as needed. For anxiety. [provider] Taking Active Self           Med Note Louretta Shorten, APRIL   Tue Sep 19, 2019 11:02 AM)    losartan (COZAAR) 100 MG tablet [id number] Yes Take 1 tablet (100 mg total) by mouth daily. [name], MD Taking Active   metoprolol tartrate (LOPRESSOR) 100 MG tablet [id number] Yes Take one tablet by mouth 2 hours prior to your CT Belva Crome, MD Taking Active   naproxen (NAPROSYN) 500 MG tablet [id number] Yes TAKE 1 TABLET (500 MG TOTAL) BY MOUTH 2 (TWO) TIMES DAILY WITH A MEAL.  Patient taking differently: as needed.   Lyndal Pulley, DO Taking Active   omeprazole (PRILOSEC) 20 MG capsule [id number] Yes Take 1 capsule (20 mg total) by mouth daily. [name], MD Taking Active   [facility] ULTRA test strip [id number] Yes USE 1 STRIP TWICE DAILY DX CODE E11.65 Renato Shin, MD Taking Active   predniSONE (DELTASONE) 20 MG tablet [id number] Yes Take 1 tablet (20 mg total) by mouth daily with breakfast. Marrian Salvage, FNP Taking Active   promethazine (PHENERGAN) 12.5 MG tablet [id number] Yes Take 1 tablet (12.5 mg total) by mouth every 8 (eight) hours as needed for nausea or vomiting. Pieter Partridge, DO Taking Active   Semaglutide (OZEMPIC, 0.25 OR 0.5 MG/DOSE, [place]) [id number] No Inject 0.25 mg into the skin once a week.   Patient not taking: Reported on 01/27/2021   [provider] Not Taking Active            Med Note Charlton Haws   Fri Nov 01, 2020  2:18 PM) Not started yet - pursuing PAP  tiZANidine (ZANAFLEX) 2 MG tablet [id number] Yes TAKE 1 TABLET BY MOUTH EVERY 8 HOURS AS NEEDED FOR MUSCLE SPASMS  Patient taking differently: Take 2 mg by mouth as needed.   Lyndal Pulley, DO Taking Active   traMADol Veatrice Bourbon) 50 MG tablet [id number] Yes Take 1 tablet (50 mg total) by mouth every 6 (six) hours as needed. Lyndal Pulley, DO Taking Active   traZODone (DESYREL) 50 MG tablet [id number] Yes TAKE 1/2 TO 1 TABLET BY MOUTH AT BEDTIME AS NEEDED FOR SLEEP  Patient taking differently: Take 50 mg by mouth as needed.   [name], MD Taking Active   Vitamin D, Ergocalciferol, (DRISDOL) 1.25 MG (50000 UNIT) CAPS capsule [id number] Yes TAKE 1 CAPSULE BY MOUTH ONE TIME PER WEEK Lyndal Pulley, DO Taking Active           Patient Active Problem List   Diagnosis Date Noted  . Leg swelling 07/12/2020  . Cervical disc disorder with radiculopathy of cervical region 01/25/2020  . Coronary atherosclerosis of native coronary artery 01/25/2020  . Nonallopathic lesion of sacral region 06/27/2018  . Urinary problem 03/08/2018  . Subluxation of peroneal tendon  of right foot 11/10/2017  . Lumbar radiculopathy, right 06/23/2017  . Closed displaced fracture of fifth metatarsal bone of left foot 03/16/2017  . GERD (gastroesophageal reflux  disease) 12/25/2016  . Greater trochanteric bursitis, right 06/17/2016  . Headache(784.0) 05/06/2016  . Diabetes ([place]) 04/02/2016  . Nonallopathic lesion of lumbosacral region 01/15/2016  . Routine general medical examination at a health care facility 11/25/2015  . Nonallopathic lesion of thoracic region 09/26/2014  . Nonallopathic lesion-rib cage 09/26/2014  . Nonallopathic lesion of cervical region 07/19/2014  . Hx of osteopenia 10/11/2012  . Seasonal allergic rhinitis 03/21/2012  . Fibromyalgia 08/12/2011  . Palpitations 04/15/2011  . DEPRESSIVE DISORDER NOT ELSEWHERE CLASSIFIED 03/27/2010  . Vitamin D deficiency 05/25/2008  . Lumbar radiculopathy 04/11/2008  . Essential hypertension 03/14/2008  . History of cardiovascular disorder 02/02/2008  . Hyperlipidemia associated with type 2 diabetes mellitus ([place]) 01/18/2007    Immunization History  Administered Date(s) Administered  . Fluad Quad(high Dose 65+) 08/11/2019, 11/27/2020  . Influenza Split 10/13/2011, 10/11/2012  . Influenza Whole 11/17/2007, 09/26/2010  . Influenza, High Dose Seasonal PF 09/10/2016, 09/30/2017, 09/08/2018  . Influenza,inj,Quad PF,6+ Mos 11/07/2013, 08/29/2014, 09/13/2015  . PFIZER(Purple Top)SARS-COV-2 Vaccination 02/11/2020, 03/12/2020  . Pneumococcal Conjugate-13 12/24/2016  . Pneumococcal Polysaccharide-23 02/08/2019  . Td 06/07/2009    Conditions to be addressed/monitored:  Hypertension, Hyperlipidemia and Diabetes  Care Plan : [place]  Updates made by Charlton Haws, [place] since 01/31/2021 12:00 AM    Problem: Hypertension, Hyperlipidemia and Diabetes   Priority: High    Long-Range Goal: Disease management   Start Date: 01/31/2021  Expected End Date: 07/31/2021  This Visit's Progress: On track  Priority: High  Note:   Current Barriers:  . Unable to independently afford treatment regimen . Unable to independently monitor therapeutic efficacy  Pharmacist Clinical  Goal(s):  [name] Over the next 180 days, patient will verbalize ability to afford treatment regimen . achieve adherence to monitoring guidelines and medication adherence to achieve therapeutic efficacy through collaboration with PharmD and provider.   Interventions: . 1:1 collaboration with [name], MD regarding development and update of comprehensive plan of care as evidenced by provider attestation and co-signature . Inter-disciplinary care team collaboration (see longitudinal plan of care) . Comprehensive medication review performed; medication list updated in electronic medical record  Hypertension (BP goal < 130/80) Controlled Current regimen:  ? Losartan 100 mg daily Interventions: ? Discussed BP goals and benefits of medications for prevention of heart attack / stroke ? Discussed white coat syndrome and importance of checking BP at home Patient self care activities ? Check BP 1-2 times weekly, document, and provide at future appointments ? Ensure daily salt intake < 2300 mg/day   Hyperlipidemia (LDL goal < 100) Controlled Current regimen:  ? Atorvastatin 80 mg daily ? Aspirin 81 mg daily Interventions: ? Discussed cholesterol goals and benefits of medications for prevention of heart attack / stroke ? Discussed foods high in cholesterol to avoid Patient self care activities - Over the next 90 days, patient will: ? Continue current medications ? Reduce cholesterol in diet   Diabetes (A1c goal < 7%) Uncontrolled Current regimen:  ? Basaglar 60 units daily Interventions: ? Discussed blood sugar goals and benefits of medications for prevention of heart attack / stroke ? Ozempic PAP was approved 01/15/21-12/13/21. First shipment will arrive by early March 2022. Patient self care activities   ? Check blood sugar once daily, document, and provide at future appointments ? Contact provider with  any episodes of hypoglycemia  Patient Goals/Self-Care Activities . Over the  next 180 days, patient will:  - take medications as prescribed focus on medication adherence by pill box check glucose daily, document, and provide at future appointments  Follow Up Plan: Telephone follow up appointment with care management team member scheduled for: 6 months      Medication Assistance: Ozempicobtained through [employer] medication assistance program.  Enrollment ends 12/13/20  -pt was approved 2/2, medication is in process and will arrive at endocrine office within 15 business days per discussion with [employer] representative today.    Patient's preferred pharmacy is:  [id number] [place], [place] [address] [address]Phone: [phone]: [phone] [place] [address]Phone: [phone]: [phone] Uses pill box? Yes Pt endorses 100% compliance  We discussed: Current pharmacy is preferred with insurance plan and patient is satisfied with pharmacy services Patient decided to: Continue current medication management strategy  Care Plan and Follow Up Patient Decision:  Patient agrees to Care Plan and Follow-up.  Plan: Telephone follow up appointment with care management team member scheduled for:  6 months  LCharlene Brooke PharmD, BStafford HospitalClinical Pharmacist [phone]

## 2021-01-31 NOTE — Patient Instructions (Signed)
Visit Information  Phone number for Pharmacist: [phone]  Goals Addressed   None    Patient Care Plan: CCM Pharmacy Care Plan    Problem Identified: Hypertension, Hyperlipidemia and Diabetes   Priority: High    Long-Range Goal: Disease management   Start Date: 01/31/2021  Expected End Date: 07/31/2021  This Visit's Progress: On track  Priority: High  Note:   Current Barriers:  . Unable to independently afford treatment regimen . Unable to independently monitor therapeutic efficacy  Pharmacist Clinical Goal(s):  [name] Over the next 180 days, patient will verbalize ability to afford treatment regimen . achieve adherence to monitoring guidelines and medication adherence to achieve therapeutic efficacy through collaboration with PharmD and provider.   Interventions: . 1:1 collaboration with [name], MD regarding development and update of comprehensive plan of care as evidenced by provider attestation and co-signature . Inter-disciplinary care team collaboration (see longitudinal plan of care) . Comprehensive medication review performed; medication list updated in electronic medical record  Hypertension (BP goal < 130/80) Controlled Current regimen:  ? Losartan 100 mg daily Interventions: ? Discussed BP goals and benefits of medications for prevention of heart attack / stroke ? Discussed white coat syndrome and importance of checking BP at home Patient self care activities ? Check BP 1-2 times weekly, document, and provide at future appointments ? Ensure daily salt intake < 2300 mg/day   Hyperlipidemia (LDL goal < 100) Controlled Current regimen:  ? Atorvastatin 80 mg daily ? Aspirin 81 mg daily Interventions: ? Discussed cholesterol goals and benefits of medications for prevention of heart attack / stroke ? Discussed foods high in cholesterol to avoid Patient self care activities - Over the next 90 days, patient will: ? Continue current  medications ? Reduce cholesterol in diet   Diabetes (A1c goal < 7%) Uncontrolled Current regimen:  ? Basaglar 60 units daily Interventions: ? Discussed blood sugar goals and benefits of medications for prevention of heart attack / stroke ? Patient will be starting Ozempic through PAP once she is approved. Plan is to reduce Basaglar to 40 units when she starts Ozempic Patient self care activities   ? Check blood sugar once daily, document, and provide at future appointments ? Contact provider with any episodes of hypoglycemia ? Reduce Basaglar to 40 mg when starting Ozempic  Patient Goals/Self-Care Activities . Over the next 180 days, patient will:  - take medications as prescribed focus on medication adherence by pill box check glucose daily, document, and provide at future appointments  Follow Up Plan: Telephone follow up appointment with care management team member scheduled for: 6 months      The patient verbalized understanding of instructions, educational materials, and care plan provided today and declined offer to receive copy of patient instructions, educational materials, and care plan.  Telephone follow up appointment with pharmacy team member scheduled for: 6 months  Charlene Brooke, PharmD, [facility] Clinical Pharmacist [facility] [facility] [phone]

## 2021-02-05 ENCOUNTER — Telehealth: Payer: Self-pay | Admitting: *Deleted

## 2021-02-05 NOTE — Telephone Encounter (Signed)
Notified pt---ozempic (5 box) from [employer] pt assistant arrived and ready to be pick up at the front office.

## 2021-02-11 NOTE — Progress Notes (Signed)
[place] Phone: [phone] Subjective:   [name], am serving as a scribe for Dr. Hulan Saas. This visit occurred during the SARS-CoV-2 public health emergency.  Safety protocols were in place, including screening questions prior to the visit, additional usage of staff PPE, and extensive cleaning of exam room while observing appropriate contact time as indicated for disinfecting solutions.   I'm seeing this patient by the request  of:  [name], MD  CC: Neck and low back pain follow-up  [id number]  Christine [name] is a 70 y.o. female coming in with complaint of back and neck pain. OMT 12/25/2020. Patient states that left side of neck is starting to hurt again. Had epidural one year ago and wonders if she needs another one.   Continues to have pain in right hip that radiates down tibia at its worst. Has been active this morning and it in pain.  Patient states that it starts on the outside of the right hip.  Is severely tender to palpation.  Patient states can wake her up at night.  Does have to get up to go to the bathroom well.  Medications patient has been prescribed: Zanaflex, Vit D  Taking:         Reviewed prior external information including notes and imaging from previsou exam, outside providers and external EMR if available.   As well as notes that were available from care everywhere and other healthcare systems.  Past medical history, social, surgical and family history all reviewed in electronic medical record.  No pertanent information unless stated regarding to the chief complaint.   Past Medical History:  Diagnosis Date  . Acute bursitis of right shoulder 08/29/2018   Right shoulder injected in August 29, 2018  . Allergic rhinitis   . Closed displaced fracture of fifth metatarsal bone of left foot 03/16/2017  . DEPRESSIVE DISORDER NOT ELSEWHERE CLASSIFIED  03/27/2010   Qualifier: Diagnosis of  By: [name] MD, [name]   [name], NP , [facility] [place]    . Diabetes ([place]) 04/02/2016  . Diabetes mellitus   . Essential hypertension 03/14/2008   Qualifier: Diagnosis of  By: [name] MD, [name]    . Fibromyalgia 08/12/2011   Diagnosed by [name] , Rheumatologist 2010   . GERD (gastroesophageal reflux disease) 12/25/2016  . Gluteal tendonitis of right buttock 10/19/2017   Injected in October 19, 2017 Injected October 07, 2018  . Greater trochanteric bursitis of left hip 06/17/2016  . Greater trochanteric bursitis of right hip 11/27/2015   Injected 11/27/2015   . History of recurrent UTIs    [name]  . HTN (hypertension)   . Hx of osteopenia 10/11/2012   [facility]EXA; ordered by [name] Lowest T score: -2.1 @ lumbar spine @ [facility] 11/01/12; decrease of 8.5% vs 05/2009. There is 9.2% risk over 10 yrs History fracture left elbow @ 4 Fractured left wrist , right elbow and nose post fall July/18/2013. Dr. [name] No FH Osteoporosis No PMH of bisphosphonate therapy (generic Fosamax Rxed but not filled  due to polypharmacy )   . Hyperlipidemia   . Hyperlipidemia associated with type 2 diabetes mellitus ([place]) 01/18/2007   Qualifier: Diagnosis of  By: [name]  With DM LDL goal = < 100, ideally < 70. Mi in father @ 34; 2 brothers @ 26 & 39    . Lumbar radiculopathy 04/11/2008  Qualifier: Diagnosis of  By: [name] MD, Erick Blinks well to epidural 01/12/2017  repeat epidural given May 2018  . Migraine headache    quiescent  . Nonallopathic lesion of cervical region 07/19/2014  . Nonallopathic lesion of lumbosacral region 01/15/2016  . Nonallopathic lesion of sacral region 06/27/2018  . Nonallopathic lesion-rib cage 09/26/2014  . Palpitations 04/15/2011  . Rotator cuff impingement syndrome of left shoulder 05/11/2014  . Seasonal allergic rhinitis 03/21/2012  . Sinusitis, chronic 04/20/2016  . Sleep disorder    [name] May  .  Subluxation of peroneal tendon of right foot 11/10/2017  . TIA (transient ischemic attack)   . TRANSIENT ISCHEMIC ATTACKS, HX OF 02/02/2008   Qualifier: Diagnosis of  By: [name]    . UNS ADVRS EFF UNS RX MEDICINAL&BIOLOGICAL SBSTNC 02/02/2008   Qualifier: Diagnosis of  By: [name] MD, [name]    . UNSPECIFIED MYALGIA AND MYOSITIS 02/02/2008   Qualifier: Diagnosis of  By: [name] MD, [name]    . UTI (urinary tract infection) 10/05/2014  . Viral upper respiratory tract infection with cough 12/31/2014  . Vitamin D deficiency 05/25/2008   Qualifier: Diagnosis of  By: [name] MD, [name]      Allergies  Allergen Reactions  . Vioxx [Rofecoxib] Other (See Comments)    Excess BP which caused TIA   . Prednisone Other (See Comments)    Mental status changes with high-dose oral agent. Tolerates epidural steroid injections. No associated rash or fever  . Augmentin [Amoxicillin-Pot Clavulanate]     Diarrhea  . Macrodantin [Nitrofurantoin Macrocrystal] Hives  . Sulfa Antibiotics     Hives  . Colesevelam Other (See Comments)    Leg Pain     Review of Systems:  No headache, visual changes, nausea, vomiting, diarrhea, constipation, dizziness, abdominal pain, skin rash, fevers, chills, night sweats, weight loss, swollen lymph nodes,  joint swelling, chest pain, shortness of breath, mood changes. POSITIVE muscle aches, body aches  Objective  Blood pressure 110/82, pulse 100, height 5\' 6"  (1.676 m), weight 178 lb (80.7 kg), SpO2 97 %.   General: No apparent distress alert and oriented x3 mood and affect normal, dressed appropriately.  HEENT: Pupils equal, extraocular movements intact  Respiratory: Patient's speak in full sentences and does not appear short of breath  Cardiovascular: No lower extremity edema, non tender, no erythema  Gait normal with good balance and coordination.  MSK: Mild arthritic changes of multiple joints.  Tender to palpation in the soft tissue of multiple joints  patient does have a history of fibromyalgia. Back -low back exam does have some loss of lordosis.  Patient does have some mild tightness noted in the paraspinal musculature.  Patient does have many different areas of the back and the musculature that does have pain but no increasing discomfort over the right greater trochanteric area  Osteopathic findings  C2 flexed rotated and side bent right T3 extended rotated and side bent right inhaled rib T9 extended rotated and side bent left L2 flexed rotated and side bent right Sacrum right on right   After verbal consent patient was prepped with alcohol swab and with a 21-gauge 2 inch needle injected into the right greater trochanteric area with a total of 0.5 cc of 0.5% Marcaine and 0.5 cc of Kenalog 40 mg/mL.  No blood loss.  Band-Aid placed.  Postinjection instructions given.    Assessment and Plan:  Cervical disc disorder with radiculopathy of cervical region Known arthritic changes.  Overall doing relatively well.  Responding well to osteopathic manipulation.  Patient has had an epidural previously in the neck and would like to avoid it if possible but may needed in the future.  Discussed that it was a year ago and we will continue to monitor.  Follow-up with me again in 6 to 8 weeks otherwise.  Greater trochanteric bursitis, right Patient given injection today.  This will help with the diagnosis of potential lumbar radiculopathy that is greater trochanteric bursitis.  Hopefully patient will respond well.  Worsening pain can consider further imaging but I do not think will be necessary.  He does have known arthritic changes of the hip as well.  Follow-up again 6 weeks.  Patient knows to watch her blood sugars with her history of diabetes.  Lumbar radiculopathy, right We will continue to monitor.  Depending on how patient responds to the greater trochanteric injection we will need to consider further work-up.  X-rays did show facet arthropathy  at L4-L5 that could be consistent with some of the radicular symptoms.  Patient would like to avoid for surgical intervention if necessary and we will monitor.  Arthritis of right hip Has arthritis of the right hip.  May need to consider the possibility of further evaluation with a hip injection worsening pain patient would be a good surgical candidate for a hip replacement as well and likely anterior approach if necessary.  Patient will continue to be active otherwise.  Follow-up again in 6 weeks did discuss recumbent bike has a better lower impact exercise than treadmill.    Nonallopathic problems  Decision today to treat with OMT was based on Physical Exam  After verbal consent patient was treated with  ME, FPR techniques in cervical, rib, thoracic, lumbar, and sacral  areas  Patient tolerated the procedure well with improvement in symptoms  Patient given exercises, stretches and lifestyle modifications  See medications in patient instructions if given  Patient will follow up in 4-8 weeks      The above documentation has been reviewed and is accurate and complete Lyndal Pulley, DO       Note: This dictation was prepared with Dragon dictation along with smaller phrase technology. Any transcriptional errors that result from this process are unintentional.

## 2021-02-12 ENCOUNTER — Encounter: Payer: Self-pay | Admitting: Family Medicine

## 2021-02-12 ENCOUNTER — Other Ambulatory Visit: Payer: Self-pay

## 2021-02-12 ENCOUNTER — Ambulatory Visit (INDEPENDENT_AMBULATORY_CARE_PROVIDER_SITE_OTHER): Payer: Medicare Other | Admitting: Family Medicine

## 2021-02-12 VITALS — BP 110/82 | HR 100 | Ht 66.0 in | Wt 178.0 lb

## 2021-02-12 DIAGNOSIS — M1611 Unilateral primary osteoarthritis, right hip: Secondary | ICD-10-CM | POA: Diagnosis not present

## 2021-02-12 DIAGNOSIS — M5010 Cervical disc disorder with radiculopathy, unspecified cervical region: Secondary | ICD-10-CM

## 2021-02-12 DIAGNOSIS — M5416 Radiculopathy, lumbar region: Secondary | ICD-10-CM | POA: Diagnosis not present

## 2021-02-12 DIAGNOSIS — M999 Biomechanical lesion, unspecified: Secondary | ICD-10-CM | POA: Diagnosis not present

## 2021-02-12 DIAGNOSIS — M7061 Trochanteric bursitis, right hip: Secondary | ICD-10-CM

## 2021-02-12 NOTE — Patient Instructions (Addendum)
Injected GT today If neck gets worse can do epidural Your back is doing great Keep doing everything else Get a recumbent bike  See me in 6-8 weeks

## 2021-02-12 NOTE — Assessment & Plan Note (Signed)
Has arthritis of the right hip.  May need to consider the possibility of further evaluation with a hip injection worsening pain patient would be a good surgical candidate for a hip replacement as well and likely anterior approach if necessary.  Patient will continue to be active otherwise.  Follow-up again in 6 weeks did discuss recumbent bike has a better lower impact exercise than treadmill.

## 2021-02-12 NOTE — Assessment & Plan Note (Signed)
We will continue to monitor.  Depending on how patient responds to the greater trochanteric injection we will need to consider further work-up.  X-rays did show facet arthropathy at L4-L5 that could be consistent with some of the radicular symptoms.  Patient would like to avoid for surgical intervention if necessary and we will monitor.

## 2021-02-12 NOTE — Assessment & Plan Note (Signed)
Patient given injection today.  This will help with the diagnosis of potential lumbar radiculopathy that is greater trochanteric bursitis.  Hopefully patient will respond well.  Worsening pain can consider further imaging but I do not think will be necessary.  He does have known arthritic changes of the hip as well.  Follow-up again 6 weeks.

## 2021-02-12 NOTE — Assessment & Plan Note (Signed)
Known arthritic changes.  Overall doing relatively well.  Responding well to osteopathic manipulation.  Patient has had an epidural previously in the neck and would like to avoid it if possible but may needed in the future.  Discussed that it was a year ago and we will continue to monitor.  Follow-up with me again in 6 to 8 weeks otherwise.

## 2021-02-13 ENCOUNTER — Other Ambulatory Visit: Payer: Self-pay | Admitting: Family Medicine

## 2021-02-26 ENCOUNTER — Encounter: Payer: Self-pay | Admitting: Endocrinology

## 2021-02-27 ENCOUNTER — Encounter: Payer: Self-pay | Admitting: Endocrinology

## 2021-02-27 NOTE — Telephone Encounter (Signed)
Patient called to just follow up on mychart message she sent yesterday.

## 2021-03-03 ENCOUNTER — Telehealth: Payer: Medicare Other

## 2021-03-20 ENCOUNTER — Other Ambulatory Visit: Payer: Self-pay

## 2021-03-20 ENCOUNTER — Ambulatory Visit (INDEPENDENT_AMBULATORY_CARE_PROVIDER_SITE_OTHER): Payer: Medicare Other | Admitting: Endocrinology

## 2021-03-20 VITALS — BP 150/86 | HR 104 | Ht 66.0 in | Wt 170.6 lb

## 2021-03-20 DIAGNOSIS — Z794 Long term (current) use of insulin: Secondary | ICD-10-CM

## 2021-03-20 DIAGNOSIS — E1151 Type 2 diabetes mellitus with diabetic peripheral angiopathy without gangrene: Secondary | ICD-10-CM | POA: Diagnosis not present

## 2021-03-20 LAB — POCT GLYCOSYLATED HEMOGLOBIN (HGB A1C): Hemoglobin A1C: 6.4 % — AB (ref 4.0–5.6)

## 2021-03-20 MED ORDER — BASAGLAR KWIKPEN 100 UNIT/ML ~~LOC~~ SOPN: 35.0000 [IU] | PEN_INJECTOR | SUBCUTANEOUS | 11 refills | Status: DC

## 2021-03-20 MED ORDER — BASAGLAR KWIKPEN 100 UNIT/ML ~~LOC~~ SOPN: 45.0000 [IU] | PEN_INJECTOR | SUBCUTANEOUS | 11 refills | Status: DC

## 2021-03-20 NOTE — Patient Instructions (Addendum)
Your blood pressure is high today.  Please see your primary care provider soon, to have it rechecked check your blood sugar twice a day.  vary the time of day when you check, between before the 3 meals, and at bedtime.  also check if you have symptoms of your blood sugar being too high or too low.  please keep a record of the readings and bring it to your next appointment here.  You can write it on any piece of paper.  please call us sooner if your blood sugar goes below 70, or if you have a lot of readings over 200.   Please reduce the basaglar to 35 units each morning, and continue the same Ozempic.   Please come back for a follow-up appointment in 3 months.

## 2021-03-20 NOTE — Progress Notes (Signed)
Subjective:    Patient ID: Christine [name], female    DOB: August 24, 1951, 70 y.o.   MRN: [id number]  HPI Pt returns for f/u of diabetes mellitus: DM type: Insulin-requiring type 2 Dx'ed: 5638 Complications: TIA Therapy: insulin since 2014, and Ozempic.   GDM: never. DKA: never.  Severe hypoglycemia: never.  Pancreatitis: never.   Other: She declines multiple daily injections; pioglitizone was for NASH, but she stopped due to edema; a trial to convert back to oral rx in 2017 was unsuccessful; she declines additional DM rx.   Interval history: she brings a record of her cbg's which I have reviewed today.  It varies from 51-126.  pt states she feels well in general. She has received Ozempic, which is causing nausea and belching.  She has hypoglycemia QD.   Past Medical History:  Diagnosis Date  . Acute bursitis of right shoulder 08/29/2018   Right shoulder injected in August 29, 2018  . Allergic rhinitis   . Closed displaced fracture of fifth metatarsal bone of left foot 03/16/2017  . DEPRESSIVE DISORDER NOT ELSEWHERE CLASSIFIED 03/27/2010   Qualifier: Diagnosis of  By: [name] MD, [name]   [name], NP , [facility] [place] [place]    . Diabetes ([place]) 04/02/2016  . Diabetes mellitus   . Essential hypertension 03/14/2008   Qualifier: Diagnosis of  By: [name] MD, [name]    . Fibromyalgia 08/12/2011   Diagnosed by [name] , Rheumatologist 2010   . GERD (gastroesophageal reflux disease) 12/25/2016  . Gluteal tendonitis of right buttock 10/19/2017   Injected in October 19, 2017 Injected October 07, 2018  . Greater trochanteric bursitis of left hip 06/17/2016  . Greater trochanteric bursitis of right hip 11/27/2015   Injected 11/27/2015   . History of recurrent UTIs    [name]  . HTN (hypertension)   . Hx of osteopenia 10/11/2012   [facility]; ordered by [name] Lowest T score: -2.1 @ lumbar spine @ [facility] 11/01/12; decrease of 8.5% vs 05/2009. There is 9.2% risk  over 10 yrs History fracture left elbow @ 4 Fractured left wrist , right elbow and nose post fall July/18/2013. Dr. [name] No FH Osteoporosis No PMH of bisphosphonate therapy (generic Fosamax Rxed but not filled  due to polypharmacy )   . Hyperlipidemia   . Hyperlipidemia associated with type 2 diabetes mellitus ([place]) 01/18/2007   Qualifier: Diagnosis of  By: [name]  With DM LDL goal = < 100, ideally < 70. Mi in father @ 27; 2 brothers @ 54 & 77    . Lumbar radiculopathy 04/11/2008   Qualifier: Diagnosis of  By: [name] MD, Erick Blinks well to epidural 01/12/2017  repeat epidural given May 2018  . Migraine headache    quiescent  . Nonallopathic lesion of cervical region 07/19/2014  . Nonallopathic lesion of lumbosacral region 01/15/2016  . Nonallopathic lesion of sacral region 06/27/2018  . Nonallopathic lesion-rib cage 09/26/2014  . Palpitations 04/15/2011  . Rotator cuff impingement syndrome of left shoulder 05/11/2014  . Seasonal allergic rhinitis 03/21/2012  . Sinusitis, chronic 04/20/2016  . Sleep disorder    [name] May  . Subluxation of peroneal tendon of right foot 11/10/2017  . TIA (transient ischemic attack)   . TRANSIENT ISCHEMIC ATTACKS, HX OF 02/02/2008   Qualifier: Diagnosis of  By: [name]    . UNS ADVRS EFF UNS RX MEDICINAL&BIOLOGICAL SBSTNC 02/02/2008   Qualifier: Diagnosis of  By: [name] MD, [name]    .  UNSPECIFIED MYALGIA AND MYOSITIS 02/02/2008   Qualifier: Diagnosis of  By: [name] MD, [name]    . UTI (urinary tract infection) 10/05/2014  . Viral upper respiratory tract infection with cough 12/31/2014  . Vitamin D deficiency 05/25/2008   Qualifier: Diagnosis of  By: [name] MD, [name]      Past Surgical History:  Procedure Laterality Date  . COLONOSCOPY     Dr Carlean [name]; hemorrhoids  . CORONARY ANGIOPLASTY  1997   for chest pain- negative   . POLYPECTOMY  2002   benign, hyperplastic polyp; rectal bleeding 2008  . TONSILLECTOMY    . TOTAL ABDOMINAL  HYSTERECTOMY  1983   BSO for endometriosis  . WISDOM TOOTH EXTRACTION      Social History   Socioeconomic History  . Marital status: Married    Spouse name: Not on file  . Number of children: Not on file  . Years of education: Not on file  . Highest education level: Not on file  Occupational History  . 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 Smoking status: Never Smoker  . Smokeless tobacco: Never Used  Vaping Use  . Vaping Use: Never used  Substance and Sexual Activity  . Alcohol use: No  . Drug use: No  . Sexual activity: Not on file  Other Topics Concern  . Not on file  Social History Narrative   Regular exercise- no    Social Determinants of Health   Financial Resource Strain: Low Risk   . Difficulty of Paying Living Expenses: Not very hard  Food Insecurity: Not on file  Transportation Needs: Not on file  Physical Activity: Not on file  Stress: Not on file  Social Connections: Not on file  Intimate Partner Violence: Not on file    Current Outpatient Medications on File Prior to Visit  Medication Sig Dispense Refill  . aspirin 81 MG tablet Take 81 mg by mouth daily.    [name] atorvastatin (LIPITOR) 40 MG tablet Take 1 tablet (40 mg total) by mouth daily. 90 tablet 3  . BD PEN NEEDLE NANO 2ND GEN 32G X 4 MM MISC USE ONCE A DAY AS DIRECTED (Patient taking differently: 1 each by Other route daily. E11.9) 90 each 3  . cephALEXin (KEFLEX) 250 MG capsule Take 1 capsule (250 mg total) by mouth daily. 90 capsule 1  . DULoxetine (CYMBALTA) 60 MG capsule Take 60 mg by mouth daily.    . Lancets (ONETOUCH ULTRASOFT) lancets Check blood sugar once daily. Dx code: 250.00 100 each 12  . LORazepam (ATIVAN) 0.5 MG tablet Take 0.5 mg by mouth as needed. For anxiety.    . metoprolol tartrate (LOPRESSOR) 100 MG tablet Take one tablet by mouth 2 hours prior to your CT 1 tablet 0  . naproxen (NAPROSYN) 500 MG tablet TAKE 1 TABLET (500 MG  TOTAL) BY MOUTH 2 (TWO) TIMES DAILY WITH A MEAL. (Patient taking differently: as needed.) 180 tablet 1  . omeprazole (PRILOSEC) 20 MG capsule Take 1 capsule (20 mg total) by mouth daily. 90 capsule 3  . ONETOUCH ULTRA test strip USE 1 STRIP TWICE DAILY DX CODE E11.65 100 strip 3  . promethazine (PHENERGAN) 12.5 MG tablet Take 1 tablet (12.5 mg total) by mouth every 8 (eight) hours as needed for nausea or vomiting. 20 tablet 8  . Semaglutide (OZEMPIC, 0.25 OR 0.5 MG/DOSE, [place]) Inject 0.25 mg into the skin once a week.    . telmisartan (  MICARDIS) 40 MG tablet Take 1 tablet (40 mg total) by mouth daily. 90 tablet 3  . tiZANidine (ZANAFLEX) 2 MG tablet Take 1 tablet (2 mg total) by mouth at bedtime. 90 tablet 1  . traMADol (ULTRAM) 50 MG tablet Take 1 tablet (50 mg total) by mouth every 6 (six) hours as needed. 20 tablet 0  . traZODone (DESYREL) 50 MG tablet TAKE 1/2 TO 1 TABLET BY MOUTH AT BEDTIME AS NEEDED FOR SLEEP (Patient taking differently: Take 50 mg by mouth as needed.) 90 tablet 1  . Vitamin D, Ergocalciferol, (DRISDOL) 1.25 MG (50000 UNIT) CAPS capsule TAKE 1 CAPSULE BY MOUTH ONE TIME PER WEEK 12 capsule 0   No current facility-administered medications on file prior to visit.    Allergies  Allergen Reactions  . Vioxx [Rofecoxib] Other (See Comments)    Excess BP which caused TIA   . Prednisone Other (See Comments)    Mental status changes with high-dose oral agent. Tolerates epidural steroid injections. No associated rash or fever  . Augmentin [Amoxicillin-Pot Clavulanate]     Diarrhea  . Macrodantin [Nitrofurantoin Macrocrystal] Hives  . Sulfa Antibiotics     Hives  . Colesevelam Other (See Comments)    Leg Pain    Family History  Problem Relation Age of Onset  . Colon cancer Maternal Aunt   . Diabetes Paternal Uncle   . Heart attack Father 76  . COPD Mother   . Lung cancer Brother        smoker  . Mental illness Other        niece, committed suicide.  [name] Heart attack  Other        brother X 2; @ 64 & 15  . Stroke Neg Hx     BP (!) 150/86 (BP Location: Right Arm, Patient Position: Sitting, Cuff Size: Normal)   Pulse (!) 104   Ht 5\' 6"  (1.676 m)   Wt 170 lb 9.6 oz (77.4 kg)   LMP  (LMP Unknown)   SpO2 91%   BMI 27.54 kg/m    Review of Systems She has lost weight.      Objective:   Physical Exam VITAL SIGNS:  See vs page GENERAL: no distress Pulses: dorsalis pedis intact bilat.   MSK: no deformity of the feet CV: no leg edema Skin:  no ulcer on the feet.  normal color and temp on the feet. Neuro: sensation is intact to touch on the feet.    Lab Results  Component Value Date   HGBA1C 6.4 (A) 03/20/2021       Assessment & Plan:  Insulin-requiring type 2 DM Hypoglycemia, due to insulin.  HTN: is noted today Belching, due to Ozempic: we discussed.  She declines to reduce.  Patient Instructions  Your blood pressure is high today.  Please see your primary care provider soon, to have it rechecked check your blood sugar twice a day.  vary the time of day when you check, between before the 3 meals, and at bedtime.  also check if you have symptoms of your blood sugar being too high or too low.  please keep a record of the readings and bring it to your next appointment here.  You can write it on any piece of paper.  please call us sooner if your blood sugar goes below 70, or if you have a lot of readings over 200.   Please reduce the basaglar to 35 units each morning, and continue the same Ozempic.  Please come back for a follow-up appointment in 3 months.

## 2021-04-07 NOTE — Progress Notes (Signed)
[facility] [address] [place] Phone: [phone] Subjective:   I Christine [name] am serving as a Education administrator for Dr. Hulan Saas.  This visit occurred during the SARS-CoV-2 public health emergency.  Safety protocols were in place, including screening questions prior to the visit, additional usage of staff PPE, and extensive cleaning of exam room while observing appropriate contact time as indicated for disinfecting solutions.   I'm seeing this patient by the request  of:  [name], MD  CC: Back and neck pain follow-up  FMB:[id number]  Christine [name] is a 70 y.o. female coming in with complaint of back and neck pain. OMT 02/12/2021. Patient states she is doing well today.  Patient has had some mild increase in stiffness urinary.  Mild increase in neck pain but nothing severe.  Nothing that stopping her from any activities.  Patient recently though has lost 19 pounds over the course the last several months and is very happy with this.  Has been watching her diet and has started on a new medication but she seems to be doing better with.  Patient has had some mild increase in headaches recently but feels it is secondary to the medication.  Medications patient has been prescribed: Vit D  Taking: Yes         Reviewed prior external information including notes and imaging from previsou exam, outside providers and external EMR if available.   As well as notes that were available from care everywhere and other healthcare systems.  Past medical history, social, surgical and family history all reviewed in electronic medical record.  No pertanent information unless stated regarding to the chief complaint.   Past Medical History:  Diagnosis Date  . Acute bursitis of right shoulder 08/29/2018   Right shoulder injected in August 29, 2018  . Allergic rhinitis   . Closed displaced fracture of fifth metatarsal bone of left foot  03/16/2017  . DEPRESSIVE DISORDER NOT ELSEWHERE CLASSIFIED 03/27/2010   Qualifier: Diagnosis of  By: [name] MD, [name]   [name], NP , [facility] [place]    . Diabetes ([place]) 04/02/2016  . Diabetes mellitus   . Essential hypertension 03/14/2008   Qualifier: Diagnosis of  By: [name] MD, [name]    . Fibromyalgia 08/12/2011   Diagnosed by [name] , Rheumatologist 2010   . GERD (gastroesophageal reflux disease) 12/25/2016  . Gluteal tendonitis of right buttock 10/19/2017   Injected in October 19, 2017 Injected October 07, 2018  . Greater trochanteric bursitis of left hip 06/17/2016  . Greater trochanteric bursitis of right hip 11/27/2015   Injected 11/27/2015   . History of recurrent UTIs    [name]  . HTN (hypertension)   . Hx of osteopenia 10/11/2012   [facility] DEXA; ordered by [name] Lowest T score: -2.1 @ lumbar spine @ [facility] 11/01/12; decrease of 8.5% vs 05/2009. There is 9.2% risk over 10 yrs History fracture left elbow @ 4 Fractured left wrist , right elbow and nose post fall July/18/2013. Dr. [name] No FH Osteoporosis No PMH of bisphosphonate therapy (generic Fosamax Rxed but not filled  due to polypharmacy )   . Hyperlipidemia   . Hyperlipidemia associated with type 2 diabetes mellitus ([place]) 01/18/2007   Qualifier: Diagnosis of  By: [name]  With DM LDL goal = < 100, ideally < 70. Mi in father @ 59; 2 brothers @ 55 & 71    .  Lumbar radiculopathy 04/11/2008   Qualifier: Diagnosis of  By: [name] MD, Erick Blinks well to epidural 01/12/2017  repeat epidural given May 2018  . Migraine headache    quiescent  . Nonallopathic lesion of cervical region 07/19/2014  . Nonallopathic lesion of lumbosacral region 01/15/2016  . Nonallopathic lesion of sacral region 06/27/2018  . Nonallopathic lesion-rib cage 09/26/2014  . Palpitations 04/15/2011  . Rotator cuff impingement syndrome of left shoulder 05/11/2014  . Seasonal allergic rhinitis 03/21/2012  . Sinusitis,  chronic 04/20/2016  . Sleep disorder    [name] May  . Subluxation of peroneal tendon of right foot 11/10/2017  . TIA (transient ischemic attack)   . TRANSIENT ISCHEMIC ATTACKS, HX OF 02/02/2008   Qualifier: Diagnosis of  By: [name]    . UNS ADVRS EFF UNS RX MEDICINAL&BIOLOGICAL SBSTNC 02/02/2008   Qualifier: Diagnosis of  By: [name] MD, [name]    . UNSPECIFIED MYALGIA AND MYOSITIS 02/02/2008   Qualifier: Diagnosis of  By: [name] MD, [name]    . UTI (urinary tract infection) 10/05/2014  . Viral upper respiratory tract infection with cough 12/31/2014  . Vitamin D deficiency 05/25/2008   Qualifier: Diagnosis of  By: [name] MD, [name]      Allergies  Allergen Reactions  . Vioxx [Rofecoxib] Other (See Comments)    Excess BP which caused TIA   . Prednisone Other (See Comments)    Mental status changes with high-dose oral agent. Tolerates epidural steroid injections. No associated rash or fever  . Augmentin [Amoxicillin-Pot Clavulanate]     Diarrhea  . Macrodantin [Nitrofurantoin Macrocrystal] Hives  . Sulfa Antibiotics     Hives  . Colesevelam Other (See Comments)    Leg Pain     Review of Systems:  No  visual changes, nausea, vomiting, diarrhea, constipation,  abdominal pain, skin rash, fevers, chills, night sweats, weight loss, swollen lymph nodes,, joint swelling, chest pain, shortness of breath, mood changes. POSITIVE muscle aches, body aches, headache  Objective  Blood pressure 136/80, pulse 90, height 5\' 6"  (1.676 m), weight 169 lb (76.7 kg), SpO2 96 %.   General: No apparent distress alert and oriented x3 mood and affect normal, dressed appropriately.  HEENT: Pupils equal, extraocular movements intact  Respiratory: Patient's speak in full sentences and does not appear short of breath  Cardiovascular: No lower extremity edema, non tender, no erythema  Neck exam does have some mild loss of lordosis.  Patient does have tightness with sidebending.  Negative  Spurling's noted today.  5 out of 5 strength of the upper extremities. Low back exam shows more tightness around the right sacroiliac joint.  He does have some tenderness noted over the gluteal tendon as well as the greater trochanteric area of the right hip.  Mild tightness with FABER test.   Osteopathic findings  C7 flexed rotated and side bent left T3 extended rotated and side bent right inhaled rib L1 flexed rotated and side bent left Sacrum right on right       Assessment and Plan:  Cervical disc disorder with radiculopathy of cervical region Chronic problem with no significant exacerbation at this time.  Discussed with patient about icing regimen and home exercises.  Patient does have naproxen she has been using a little bit more with some intermittent.  Has had some mild increase in headaches recently but she thinks it is secondary to more of her medication changes.  Discussed home exercises and icing regimen.  Increase activity slowly.  Follow-up with  me again in  8 weeks.    Nonallopathic problems  Decision today to treat with OMT was based on Physical Exam  After verbal consent patient was treated with HVLA, ME, FPR techniques in cervical, rib, thoracic, lumbar, and sacral  areas  Patient tolerated the procedure well with improvement in symptoms  Patient given exercises, stretches and lifestyle modifications  See medications in patient instructions if given  Patient will follow up in 8 weeks      The above documentation has been reviewed and is accurate and complete Lyndal Pulley, DO       Note: This dictation was prepared with Dragon dictation along with smaller phrase technology. Any transcriptional errors that result from this process are unintentional.

## 2021-04-08 ENCOUNTER — Encounter: Payer: Self-pay | Admitting: Family Medicine

## 2021-04-08 ENCOUNTER — Ambulatory Visit (INDEPENDENT_AMBULATORY_CARE_PROVIDER_SITE_OTHER): Payer: Medicare Other | Admitting: Family Medicine

## 2021-04-08 ENCOUNTER — Other Ambulatory Visit: Payer: Self-pay

## 2021-04-08 DIAGNOSIS — M5010 Cervical disc disorder with radiculopathy, unspecified cervical region: Secondary | ICD-10-CM

## 2021-04-08 NOTE — Patient Instructions (Addendum)
Good to see you You should be proud of yourself for the weight loss  See me again in 7-8 weeks

## 2021-04-08 NOTE — Assessment & Plan Note (Signed)
Chronic problem with no significant exacerbation at this time.  Discussed with patient about icing regimen and home exercises.  Patient does have naproxen she has been using a little bit more with some intermittent.  Has had some mild increase in headaches recently but she thinks it is secondary to more of her medication changes.  Discussed home exercises and icing regimen.  Increase activity slowly.  Follow-up with me again in  8 weeks.

## 2021-04-09 ENCOUNTER — Ambulatory Visit: Payer: Medicare Other | Admitting: Endocrinology

## 2021-04-13 ENCOUNTER — Other Ambulatory Visit: Payer: Self-pay | Admitting: Internal Medicine

## 2021-04-16 DIAGNOSIS — F410 Panic disorder [episodic paroxysmal anxiety] without agoraphobia: Secondary | ICD-10-CM | POA: Diagnosis not present

## 2021-04-16 DIAGNOSIS — F339 Major depressive disorder, recurrent, unspecified: Secondary | ICD-10-CM | POA: Diagnosis not present

## 2021-04-18 ENCOUNTER — Encounter: Payer: Self-pay | Admitting: Internal Medicine

## 2021-04-21 ENCOUNTER — Ambulatory Visit (INDEPENDENT_AMBULATORY_CARE_PROVIDER_SITE_OTHER): Payer: Medicare Other | Admitting: Internal Medicine

## 2021-04-21 ENCOUNTER — Encounter: Payer: Self-pay | Admitting: Internal Medicine

## 2021-04-21 ENCOUNTER — Other Ambulatory Visit: Payer: Self-pay

## 2021-04-21 VITALS — BP 136/74 | HR 93 | Temp 99.2°F | Ht 66.0 in | Wt 165.4 lb

## 2021-04-21 DIAGNOSIS — R5383 Other fatigue: Secondary | ICD-10-CM

## 2021-04-21 DIAGNOSIS — E559 Vitamin D deficiency, unspecified: Secondary | ICD-10-CM

## 2021-04-21 DIAGNOSIS — E1151 Type 2 diabetes mellitus with diabetic peripheral angiopathy without gangrene: Secondary | ICD-10-CM

## 2021-04-21 DIAGNOSIS — Z794 Long term (current) use of insulin: Secondary | ICD-10-CM

## 2021-04-21 DIAGNOSIS — E538 Deficiency of other specified B group vitamins: Secondary | ICD-10-CM | POA: Diagnosis not present

## 2021-04-21 DIAGNOSIS — G43009 Migraine without aura, not intractable, without status migrainosus: Secondary | ICD-10-CM | POA: Diagnosis not present

## 2021-04-21 DIAGNOSIS — R42 Dizziness and giddiness: Secondary | ICD-10-CM | POA: Diagnosis not present

## 2021-04-21 LAB — COMPREHENSIVE METABOLIC PANEL
ALT: 14 U/L (ref 0–35)
AST: 15 U/L (ref 0–37)
Albumin: 4.1 g/dL (ref 3.5–5.2)
Alkaline Phosphatase: 57 U/L (ref 39–117)
BUN: 18 mg/dL (ref 6–23)
CO2: 27 mEq/L (ref 19–32)
Calcium: 9.4 mg/dL (ref 8.4–10.5)
Chloride: 104 mEq/L (ref 96–112)
Creatinine, Ser: 0.85 mg/dL (ref 0.40–1.20)
GFR: 68.7 mL/min (ref >60.00)
Glucose, Bld: 121 mg/dL — ABNORMAL HIGH (ref 70–99)
Potassium: 4.1 mEq/L (ref 3.5–5.1)
Sodium: 139 mEq/L (ref 135–145)
Total Bilirubin: 0.4 mg/dL (ref 0.2–1.2)
Total Protein: 6.8 g/dL (ref 6.0–8.3)

## 2021-04-21 LAB — CBC
HCT: 38.2 % (ref 36.0–46.0)
Hemoglobin: 12.8 g/dL (ref 12.0–15.0)
MCHC: 33.4 g/dL (ref 30.0–36.0)
MCV: 86.6 fl (ref 78.0–100.0)
Platelets: 241 10*3/uL (ref 150.0–400.0)
RBC: 4.41 Mil/uL (ref 3.87–5.11)
RDW: 14.9 % (ref 11.5–15.5)
WBC: 6.1 10*3/uL (ref 4.0–10.5)

## 2021-04-21 LAB — VITAMIN B12: Vitamin B-12: 199 pg/mL — ABNORMAL LOW (ref 211–911)

## 2021-04-21 LAB — VITAMIN D 25 HYDROXY (VIT D DEFICIENCY, FRACTURES): VITD: 43.41 ng/mL (ref 30.00–100.00)

## 2021-04-21 LAB — TSH: TSH: 1.58 u[IU]/mL (ref 0.35–4.50)

## 2021-04-21 MED ORDER — MECLIZINE HCL 25 MG PO TABS: 25.0000 mg | ORAL_TABLET | Freq: Every day | ORAL | 0 refills | Status: DC | PRN

## 2021-04-21 MED ORDER — PROMETHAZINE HCL 12.5 MG PO TABS: 12.5000 mg | ORAL_TABLET | Freq: Three times a day (TID) | ORAL | 8 refills | Status: DC | PRN

## 2021-04-21 MED ORDER — TRAMADOL HCL 50 MG PO TABS: 50.0000 mg | ORAL_TABLET | Freq: Four times a day (QID) | ORAL | 0 refills | Status: DC | PRN

## 2021-04-21 NOTE — Progress Notes (Signed)
   Subjective:   Patient ID: Christine [name], female    DOB: 07/29/1951, 70 y.o.   MRN: [id number]  HPI The patient is a 70 YO female coming in for dizziness. Started off and on for months to a year or more. She gets the dizziness with the left side only. She cannot sleep on the left side when active. She gets some mild dizziness with standing which clears in a few seconds. Usually BP is normal or borderline for high. Has been having some problems with lower sugars. She is on ozempic and this is causing almost no appetite. She is on the 0.5 mg currently and endo has decreased her insulin to 35 units. She is still having morning sugars around 70 or so multiple times lately. She feels like during the day she is eating to keep her sugars from going low. Has tried drinking more fluids but this does not help the dizziness.  Review of Systems  Constitutional: Positive for appetite change and unexpected weight change.  HENT: Negative.   Eyes: Negative.   Respiratory: Negative for cough, chest tightness and shortness of breath.   Cardiovascular: Negative for chest pain, palpitations and leg swelling.  Gastrointestinal: Positive for nausea and vomiting. Negative for abdominal distention, abdominal pain, constipation and diarrhea.  Endocrine:       Low sugars  Musculoskeletal: Negative.   Skin: Negative.   Neurological: Positive for dizziness.  Psychiatric/Behavioral: Negative.     Objective:  Physical Exam Constitutional:      Appearance: She is well-developed.  HENT:     Head: Normocephalic and atraumatic.  Cardiovascular:     Rate and Rhythm: Normal rate and regular rhythm.  Pulmonary:     Effort: Pulmonary effort is normal. No respiratory distress.     Breath sounds: Normal breath sounds. No wheezing or rales.  Abdominal:     General: Bowel sounds are normal. There is no distension.     Palpations: Abdomen is soft.     Tenderness: There is no abdominal tenderness. There is no  rebound.  Musculoskeletal:     Cervical back: Normal range of motion.  Skin:    General: Skin is warm and dry.  Neurological:     Mental Status: She is alert and oriented to person, place, and time.     Coordination: Coordination normal.     Vitals:   04/21/21 1308  BP: 136/74  Pulse: 93  Temp: 99.2 F (37.3 C)  TempSrc: Oral  SpO2: 97%  Weight: 165 lb 6.4 oz (75 kg)  Height: 5\' 6"  (1.676 m)   This visit occurred during the SARS-CoV-2 public health emergency.  Safety protocols were in place, including screening questions prior to the visit, additional usage of staff PPE, and extensive cleaning of exam room while observing appropriate contact time as indicated for disinfecting solutions.   Assessment & Plan:

## 2021-04-21 NOTE — Patient Instructions (Addendum)
We will check the labs today to check for a cause.  We have given you some exercises to try to see if this helps.

## 2021-04-22 ENCOUNTER — Encounter: Payer: Self-pay | Admitting: Internal Medicine

## 2021-04-22 ENCOUNTER — Telehealth: Payer: Self-pay | Admitting: Endocrinology

## 2021-04-22 ENCOUNTER — Telehealth: Payer: Self-pay

## 2021-04-22 DIAGNOSIS — R42 Dizziness and giddiness: Secondary | ICD-10-CM | POA: Insufficient documentation

## 2021-04-22 NOTE — Telephone Encounter (Signed)
Prior authorization has been initiated on covermymeds for Promethazine 12.5 mg tablets  Key: [id number] Rx #: [id number] PA Case ID: [id number]  I will update once a decision has been made.

## 2021-04-22 NOTE — Assessment & Plan Note (Signed)
Her recent HgA1c 6.4 and morning sugars are around 70 lately. She will need dosage reduction of either ozempic or insulin. Will communicate with her endocrinologist to see what they want her to reduce and then will let her know. She is currently taking ozempic 0.5 mg weekly and basaglar 35 units daily.

## 2021-04-22 NOTE — Assessment & Plan Note (Signed)
Refill promethazine, tramadol which she uses rarely for migraine. She has expired medication which she needs to replace.

## 2021-04-22 NOTE — Telephone Encounter (Signed)
please contact patient: Please reduce insulin to 25 units. I'll see you next time.

## 2021-04-22 NOTE — Assessment & Plan Note (Signed)
Checking labs including CBC, CMP, vitamin D and B12 and TSH. Rx meclizine and given exercises to help. She has had carotid ultrasound around 1 year ago which was normal so does not need to be repeated.

## 2021-04-22 NOTE — Telephone Encounter (Signed)
Called and advised pt to reduce insulin to 25 units daily. Pt verbalized understanding.

## 2021-04-23 ENCOUNTER — Telehealth: Payer: Self-pay

## 2021-04-23 NOTE — Telephone Encounter (Signed)
Noted  

## 2021-04-24 ENCOUNTER — Telehealth: Payer: Self-pay

## 2021-04-24 NOTE — Telephone Encounter (Signed)
Pt ozempic from [facility] has arrived and waiting for pick-up and pt has been notified.

## 2021-04-24 NOTE — Telephone Encounter (Signed)
Pt came 04/24/2021 at 1:51pm to pick up medication

## 2021-05-02 ENCOUNTER — Telehealth: Payer: Self-pay

## 2021-05-02 ENCOUNTER — Encounter: Payer: Self-pay | Admitting: Internal Medicine

## 2021-05-02 MED ORDER — LOSARTAN POTASSIUM 100 MG PO TABS: 100.0000 mg | ORAL_TABLET | Freq: Every day | ORAL | 3 refills | Status: DC

## 2021-05-02 NOTE — Telephone Encounter (Signed)
Please advise 

## 2021-05-02 NOTE — Telephone Encounter (Signed)
PA KEY: [id number] Meclizine HCl 25MG  tablets Prior Auth was approved.  PA KEY:  [id number] Promethazine HCl 12.5MG  tablets Prior Auth was denied

## 2021-05-06 ENCOUNTER — Encounter: Payer: Self-pay | Admitting: Internal Medicine

## 2021-05-19 ENCOUNTER — Telehealth: Payer: Self-pay | Admitting: Endocrinology

## 2021-05-19 NOTE — Telephone Encounter (Signed)
Patient called to request that her Basaglar RX be updated ti reflect her current dosage.  Her new dosage is 25 units per day.  Please send corrected [facility] to [facility] [address] in [place] per patient request

## 2021-05-21 NOTE — Telephone Encounter (Signed)
Please advised on the request dosage change. Could not find where it was supposed to change to 25 units.

## 2021-05-22 ENCOUNTER — Other Ambulatory Visit: Payer: Self-pay | Admitting: Endocrinology

## 2021-05-22 DIAGNOSIS — Z794 Long term (current) use of insulin: Secondary | ICD-10-CM

## 2021-05-22 MED ORDER — BASAGLAR KWIKPEN 100 UNIT/ML ~~LOC~~ SOPN: 25.0000 [IU] | PEN_INJECTOR | SUBCUTANEOUS | 3 refills | Status: DC

## 2021-05-23 MED ORDER — BASAGLAR KWIKPEN 100 UNIT/ML ~~LOC~~ SOPN: 25.0000 [IU] | PEN_INJECTOR | SUBCUTANEOUS | 3 refills | Status: DC

## 2021-05-23 NOTE — Addendum Note (Signed)
Addended by: Jacqualin Combes on: 05/23/2021 02:31 PM   Modules accepted: Orders

## 2021-05-23 NOTE — Telephone Encounter (Signed)
Refill sent.

## 2021-05-23 NOTE — Telephone Encounter (Signed)
MEDICATION: basaglar 25 units  PHARMACY:   [id number] - [place], [place] [address] Phone:  [phone]  Fax:  [phone]      HAS THE PATIENT CONTACTED THEIR PHARMACY?  yes  IS THIS A 90 DAY SUPPLY : pt doesn't know, wants to see what insurance does  IS PATIENT OUT OF MEDICATION: yes  IF NOT; HOW MUCH IS LEFT: none  LAST APPOINTMENT DATE: @5 /11/2021  NEXT APPOINTMENT DATE:@7 /06/2021  DO WE HAVE YOUR PERMISSION TO LEAVE A DETAILED MESSAGE?: yes   OTHER COMMENTS:    **Let patient know to contact pharmacy at the end of the day to make sure medication is ready. **  ** Please notify patient to allow 48-72 hours to process**  **Encourage patient to contact the pharmacy for refills or they can request refills through [facility]**

## 2021-06-02 NOTE — Progress Notes (Signed)
[place] [address] Phone: [phone] Subjective:   [name], am serving as a scribe for Dr. Hulan Saas.  This visit occurred during the SARS-CoV-2 public health emergency.  Safety protocols were in place, including screening questions prior to the visit, additional usage of staff PPE, and extensive cleaning of exam room while observing appropriate contact time as indicated for disinfecting solutions.    I'm seeing this patient by the request  of:  [name], MD  CC: Back and neck pain follow-up  [id number]  04/08/2021 Chronic problem with no significant exacerbation at this time.  Discussed with patient about icing regimen and home exercises.  Patient does have naproxen she has been using a little bit more with some intermittent.  Has had some mild increase in headaches recently but she thinks it is secondary to more of her medication changes.  Discussed home exercises and icing regimen.  Increase activity slowly.  Follow-up with me again in  8 weeks.  Update 06/03/2021 Kalina Stutts Goral is a 70 y.o. female coming in with complaint of B hip and neck pain. Patient states that her R hip has been bothering her more than usual. Pain over greater trochanter.  Also having pain in L hip and this has increased since she is unable to lie R hip. Pain also over greater trochanter.       Past Medical History:  Diagnosis Date   Acute bursitis of right shoulder 08/29/2018   Right shoulder injected in August 29, 2018   Allergic rhinitis    Closed displaced fracture of fifth metatarsal bone of left foot 03/16/2017   DEPRESSIVE DISORDER NOT ELSEWHERE CLASSIFIED 03/27/2010   Qualifier: Diagnosis of  By: [name] MD, [name]   [name], NP , [facility] [place]     Diabetes ([place]) 04/02/2016   Diabetes mellitus    Essential hypertension 03/14/2008   Qualifier: Diagnosis of  By: [name] MD,  [name]     Fibromyalgia 08/12/2011   Diagnosed by [name] , Rheumatologist 2010    GERD (gastroesophageal reflux disease) 12/25/2016   Gluteal tendonitis of right buttock 10/19/2017   Injected in October 19, 2017 Injected October 07, 2018   Greater trochanteric bursitis of left hip 06/17/2016   Greater trochanteric bursitis of right hip 11/27/2015   Injected 11/27/2015    History of recurrent UTIs    [name]   HTN (hypertension)    Hx of osteopenia 10/11/2012   [facility] DEXA; ordered by [name] Lowest T score: -2.1 @ lumbar spine @ [facility] 11/01/12; decrease of 8.5% vs 05/2009. There is 9.2% risk over 10 yrs History fracture left elbow @ 4 Fractured left wrist , right elbow and nose post fall July/18/2013. Dr. [name] No FH Osteoporosis No PMH of bisphosphonate therapy (generic Fosamax Rxed but not filled  due to polypharmacy )    Hyperlipidemia    Hyperlipidemia associated with type 2 diabetes mellitus ([facility]) 01/18/2007   Qualifier: Diagnosis of  By: [name]  With DM LDL goal = < 100, ideally < 70. Mi in father @ 67; 2 brothers @ 32 & 35     Lumbar radiculopathy 04/11/2008   Qualifier: Diagnosis of  By: [name] MD, Erick Blinks well to epidural 01/12/2017  repeat epidural given May 2018   Migraine headache    quiescent   Nonallopathic lesion of cervical region 07/19/2014   Nonallopathic lesion of lumbosacral region  01/15/2016   Nonallopathic lesion of sacral region 06/27/2018   Nonallopathic lesion-rib cage 09/26/2014   Palpitations 04/15/2011   Rotator cuff impingement syndrome of left shoulder 05/11/2014   Seasonal allergic rhinitis 03/21/2012   Sinusitis, chronic 04/20/2016   Sleep disorder    Dr Dohmier   Subluxation of peroneal tendon of right foot 11/10/2017   TIA (transient ischemic attack)    TRANSIENT ISCHEMIC ATTACKS, HX OF 02/02/2008   Qualifier: Diagnosis of  By: Donalee Citrin ADVRS EFF UNS RX MEDICINAL&BIOLOGICAL [place] 02/02/2008   Qualifier: Diagnosis of   By: [name] MD, Posey Pronto AND MYOSITIS 02/02/2008   Qualifier: Diagnosis of  By: [name] MD, [name]     UTI (urinary tract infection) 10/05/2014   Viral upper respiratory tract infection with cough 12/31/2014   Vitamin D deficiency 05/25/2008   Qualifier: Diagnosis of  By: [name] MD, [name]     Past Surgical History:  Procedure Laterality Date   COLONOSCOPY     Dr Carlean [name]; hemorrhoids   CORONARY ANGIOPLASTY  1997   for chest pain- negative    POLYPECTOMY  2002   benign, hyperplastic polyp; rectal bleeding 2008   TONSILLECTOMY     TOTAL ABDOMINAL HYSTERECTOMY  1983   BSO for endometriosis   WISDOM TOOTH EXTRACTION     Social History   Socioeconomic History   Marital status: Married    Spouse name: Not on file   Number of children: Not on file   Years of education: Not on file   Highest education level: Not on file  Occupational History   Occupation: Research scientist (physical sciences)     Employer: [employer]  Tobacco Use   Smoking status: Never   Smokeless tobacco: Never  Vaping Use   Vaping Use: Never used  Substance and Sexual Activity   Alcohol use: No   Drug use: No   Sexual activity: Not on file  Other Topics Concern   Not on file  Social History Narrative   Regular exercise- no    Social Determinants of Health   Financial Resource Strain: Low Risk    Difficulty of Paying Living Expenses: Not very hard  Food Insecurity: Not on file  Transportation Needs: Not on file  Physical Activity: Not on file  Stress: Not on file  Social Connections: Not on file   Allergies  Allergen Reactions   Vioxx [Rofecoxib] Other (See Comments)    Excess BP which caused TIA    Prednisone Other (See Comments)    Mental status changes with high-dose oral agent. Tolerates epidural steroid injections. No associated rash or fever   Augmentin [Amoxicillin-Pot Clavulanate]     Diarrhea   Macrodantin [Nitrofurantoin Macrocrystal] Hives   Sulfa Antibiotics      Hives   Colesevelam Other (See Comments)    Leg Pain   Family History  Problem Relation Age of Onset   Colon cancer Maternal Aunt    Diabetes Paternal Uncle    Heart attack Father 59   COPD Mother    Lung cancer Brother        smoker   Mental illness Other        niece, committed suicide.   Heart attack Other        brother X 2; @ 90 & 40   Stroke Neg Hx     Current Outpatient Medications (Endocrine & Metabolic):    Insulin Glargine (BASAGLAR KWIKPEN) 100 UNIT/ML, Inject 25  Units into the skin every morning.   Semaglutide (OZEMPIC, 0.25 OR 0.5 MG/DOSE, [place]), Inject 0.25 mg into the skin once a week.  Current Outpatient Medications (Cardiovascular):    atorvastatin (LIPITOR) 40 MG tablet, Take 1 tablet (40 mg total) by mouth daily.   losartan (COZAAR) 100 MG tablet, Take 1 tablet (100 mg total) by mouth daily.   metoprolol tartrate (LOPRESSOR) 100 MG tablet, Take one tablet by mouth 2 hours prior to your CT  Current Outpatient Medications (Respiratory):    promethazine (PHENERGAN) 12.5 MG tablet, Take 1 tablet (12.5 mg total) by mouth every 8 (eight) hours as needed for nausea or vomiting.  Current Outpatient Medications (Analgesics):    aspirin 81 MG tablet, Take 81 mg by mouth daily.   naproxen (NAPROSYN) 500 MG tablet, TAKE 1 TABLET (500 MG TOTAL) BY MOUTH 2 (TWO) TIMES DAILY WITH A MEAL. (Patient taking differently: as needed.)   traMADol (ULTRAM) 50 MG tablet, Take 1 tablet (50 mg total) by mouth every 6 (six) hours as needed.   Current Outpatient Medications (Other):    BD PEN NEEDLE NANO 2ND GEN 32G X 4 MM MISC, USE ONCE A DAY AS DIRECTED (Patient taking differently: 1 each by Other route daily. E11.9)   cephALEXin (KEFLEX) 250 MG capsule, Take 1 capsule (250 mg total) by mouth daily.   DULoxetine (CYMBALTA) 60 MG capsule, Take 60 mg by mouth daily.   Lancets (ONETOUCH ULTRASOFT) lancets, Check blood sugar once daily. Dx code: 250.00   LORazepam (ATIVAN) 0.5 MG tablet,  Take 0.5 mg by mouth as needed. For anxiety.   meclizine (ANTIVERT) 25 MG tablet, Take 1 tablet (25 mg total) by mouth daily as needed for dizziness.   omeprazole (PRILOSEC) 20 MG capsule, TAKE 1 CAPSULE BY MOUTH EVERY DAY   ONETOUCH ULTRA test strip, USE 1 STRIP TWICE DAILY DX CODE E11.65   tiZANidine (ZANAFLEX) 2 MG tablet, Take 1 tablet (2 mg total) by mouth at bedtime.   traZODone (DESYREL) 50 MG tablet, TAKE 1/2 TO 1 TABLET BY MOUTH AT BEDTIME AS NEEDED FOR SLEEP (Patient taking differently: Take 50 mg by mouth as needed.)   Vitamin D, Ergocalciferol, (DRISDOL) 1.25 MG (50000 UNIT) CAPS capsule, TAKE 1 CAPSULE BY MOUTH ONE TIME PER WEEK   Reviewed prior external information including notes and imaging from  primary care provider As well as notes that were available from care everywhere and other healthcare systems.  Past medical history, social, surgical and family history all reviewed in electronic medical record.  No pertanent information unless stated regarding to the chief complaint.   Review of Systems:  No headache, visual changes, nausea, vomiting, diarrhea, constipation,  abdominal pain, skin rash, fevers, chills, night sweats, weight loss, swollen lymph nodes, joint swelling, chest pain, shortness of breath, mood changes. POSITIVE muscle aches, dizziness, body aches  Objective  Blood pressure 128/88, pulse 94, height 5\' 6"  (1.676 m), weight 161 lb (73 kg), SpO2 97 %.   General: No apparent distress alert and oriented x3 mood and affect normal, dressed appropriately.  HEENT: Pupils equal, extraocular movements intact  Respiratory: Patient's speak in full sentences and does not appear short of breath  Cardiovascular: No lower extremity edema, non tender, no erythema  Gait normal with good balance and coordination.  MSK: Neck exam shows the patient does have loss of lordosis.  Mild crepitus noted with range of motion.  Tightness noted with sidebending.  Mild voluntary guarding  noted.  5 out of 5  strength of the upper extremities. Lower back exam shows the patient does have some mild loss of lordosis.  Some tenderness to palpation of the paraspinal musculature of the lumbar spine.  Tightness noted with FABER test bilaterally  Osteopathic findings C6 flexed rotated and side bent left T4 extended rotated and side bent right with inhaled rib L1 flexed rotated and side bent left L3 flexed rotated and side bent right Sacrum right on right   After verbal consent patient was prepped with alcohol swab and with a 21-gauge 2 inch needle injected into the right greater trochanteric area with a total of 2 cc of 0.5% Marcaine and 1 cc of Kenalog 40 mg/mL.  No blood loss.  Band-Aid placed.  Postinjection instructions given.  After verbal consent patient was prepped with alcohol swab and with a 21-gauge 2 inch needle injected into the left greater trochanteric area with a total of 2 cc of 0.5% Marcaine and 1 cc of Kenalog 40 mg/mL.  No blood loss.  Postinjection instructions given.   Impression and Recommendations:     The above documentation has been reviewed and is accurate and complete Lyndal Pulley, DO

## 2021-06-03 ENCOUNTER — Encounter: Payer: Self-pay | Admitting: Family Medicine

## 2021-06-03 ENCOUNTER — Other Ambulatory Visit: Payer: Self-pay

## 2021-06-03 ENCOUNTER — Ambulatory Visit (INDEPENDENT_AMBULATORY_CARE_PROVIDER_SITE_OTHER): Payer: Medicare Other | Admitting: Family Medicine

## 2021-06-03 VITALS — BP 128/88 | HR 94 | Ht 66.0 in | Wt 161.0 lb

## 2021-06-03 DIAGNOSIS — M9902 Segmental and somatic dysfunction of thoracic region: Secondary | ICD-10-CM | POA: Diagnosis not present

## 2021-06-03 DIAGNOSIS — M999 Biomechanical lesion, unspecified: Secondary | ICD-10-CM

## 2021-06-03 DIAGNOSIS — M9901 Segmental and somatic dysfunction of cervical region: Secondary | ICD-10-CM | POA: Diagnosis not present

## 2021-06-03 DIAGNOSIS — M9908 Segmental and somatic dysfunction of rib cage: Secondary | ICD-10-CM | POA: Diagnosis not present

## 2021-06-03 DIAGNOSIS — M9904 Segmental and somatic dysfunction of sacral region: Secondary | ICD-10-CM | POA: Diagnosis not present

## 2021-06-03 DIAGNOSIS — M9903 Segmental and somatic dysfunction of lumbar region: Secondary | ICD-10-CM | POA: Diagnosis not present

## 2021-06-03 DIAGNOSIS — M7061 Trochanteric bursitis, right hip: Secondary | ICD-10-CM | POA: Diagnosis not present

## 2021-06-03 DIAGNOSIS — M5416 Radiculopathy, lumbar region: Secondary | ICD-10-CM

## 2021-06-03 NOTE — Patient Instructions (Addendum)
Injected both hips today Will get MRI pelvis if pain persists See me again in 6 weeks

## 2021-06-03 NOTE — Assessment & Plan Note (Signed)
Patient given repeat injection.  Patient is a diabetic and will monitor her blood sugars.  Patient recently though has had significant weight loss and it could be secondary to her Ozempic we did discuss if continuing to have pain I would like to consider the possibility of advanced imaging including MRI of the pelvis with and without contrast.  Patient is to increase activity slowly.  Follow-up with me again in 6 weeks.

## 2021-06-03 NOTE — Assessment & Plan Note (Signed)
History of lumbar radiculopathy.  Could consider the possibility of another epidural if this does not seem to make any benefit.  Does respond fairly well though to osteopathic manipulation.  Discussed icing regimen and home exercises.  Increase activity slowly.  Follow-up again 6 weeks

## 2021-06-03 NOTE — Assessment & Plan Note (Signed)

## 2021-06-04 ENCOUNTER — Ambulatory Visit (INDEPENDENT_AMBULATORY_CARE_PROVIDER_SITE_OTHER): Payer: Medicare Other

## 2021-06-04 VITALS — BP 118/78 | HR 82 | Temp 98.1°F | Ht 66.0 in | Wt 161.0 lb

## 2021-06-04 DIAGNOSIS — Z0000 Encounter for general adult medical examination without abnormal findings: Secondary | ICD-10-CM

## 2021-06-04 DIAGNOSIS — M8088XA Other osteoporosis with current pathological fracture, vertebra(e), initial encounter for fracture: Secondary | ICD-10-CM

## 2021-06-04 DIAGNOSIS — M8580 Other specified disorders of bone density and structure, unspecified site: Secondary | ICD-10-CM | POA: Diagnosis not present

## 2021-06-04 NOTE — Patient Instructions (Signed)
Ms. Christine [name] , Thank you for taking time to come for your Medicare Wellness Visit. I appreciate your ongoing commitment to your health goals. Please review the following plan we discussed and let me know if I can assist you in the future.   Screening recommendations/referrals: Colonoscopy: last done 11/03/2007; due every 10 years; order placed 06/04/2021. Mammogram: last done 05/22/2019; due every year; order placed 06/04/2021. Bone Density: last done 01/08/2015; due every 2 years; order placed 06/04/2021. Recommended yearly ophthalmology/optometry visit for glaucoma screening and checkup Recommended yearly dental visit for hygiene and checkup  Vaccinations: Influenza vaccine: 11/27/2020 Pneumococcal vaccine: 12/24/2016, 02/08/2019 Tdap vaccine: 06/07/2009; due every 10 years (OVERDUE) Shingles vaccine: never done; Please call your insurance company to determine your out of pocket expense for the Shingrix vaccine. You may receive this vaccine at your local pharmacy. Covid-19: 02/11/2020, 03/12/2020, 02/06/2021  Advanced directives: Please bring a copy of your health care power of attorney and living will to the office at your convenience. In the process of updating.  Conditions/risks identified: No healthcare goals for this year.  Next appointment: Please schedule your next Medicare Wellness Visit with your Nurse Health Advisor in 1 year by calling [phone].   Preventive Care 49 Years and Older, Female Preventive care refers to lifestyle choices and visits with your health care provider that can promote health and wellness. What does preventive care include? A yearly physical exam. This is also called an annual well check. Dental exams once or twice a year. Routine eye exams. Ask your health care provider how often you should have your eyes checked. Personal lifestyle choices, including: Daily care of your teeth and gums. Regular physical activity. Eating a healthy diet. Avoiding tobacco  and drug use. Limiting alcohol use. Practicing safe sex. Taking low-dose aspirin every day. Taking vitamin and mineral supplements as recommended by your health care provider. What happens during an annual well check? The services and screenings done by your health care provider during your annual well check will depend on your age, overall health, lifestyle risk factors, and family history of disease. Counseling  Your health care provider may ask you questions about your: Alcohol use. Tobacco use. Drug use. Emotional well-being. Home and relationship well-being. Sexual activity. Eating habits. History of falls. Memory and ability to understand (cognition). Work and work Statistician. Reproductive health. Screening  You may have the following tests or measurements: Height, weight, and BMI. Blood pressure. Lipid and cholesterol levels. These may be checked every 5 years, or more frequently if you are over 60 years old. Skin check. Lung cancer screening. You may have this screening every year starting at age 8 if you have a 30-pack-year history of smoking and currently smoke or have quit within the past 15 years. Fecal occult blood test (FOBT) of the stool. You may have this test every year starting at age 84. Flexible sigmoidoscopy or colonoscopy. You may have a sigmoidoscopy every 5 years or a colonoscopy every 10 years starting at age 61. Hepatitis C blood test. Hepatitis B blood test. Sexually transmitted disease (STD) testing. Diabetes screening. This is done by checking your blood sugar (glucose) after you have not eaten for a while (fasting). You may have this done every 1-3 years. Bone density scan. This is done to screen for osteoporosis. You may have this done starting at age 17. Mammogram. This may be done every 1-2 years. Talk to your health care provider about how often you should have regular mammograms. Talk with your health care  provider about your test results,  treatment options, and if necessary, the need for more tests. Vaccines  Your health care provider may recommend certain vaccines, such as: Influenza vaccine. This is recommended every year. Tetanus, diphtheria, and acellular pertussis (Tdap, Td) vaccine. You may need a Td booster every 10 years. Zoster vaccine. You may need this after age 32. Pneumococcal 13-valent conjugate (PCV13) vaccine. One dose is recommended after age 69. Pneumococcal polysaccharide (PPSV23) vaccine. One dose is recommended after age 36. Talk to your health care provider about which screenings and vaccines you need and how often you need them. This information is not intended to replace advice given to you by your health care provider. Make sure you discuss any questions you have with your health care provider. Document Released: 12/27/2015 Document Revised: 08/19/2016 Document Reviewed: 10/01/2015 Elsevier Interactive Patient Education  2017 [place] Prevention in the Home Falls can cause injuries. They can happen to people of all ages. There are many things you can do to make your home safe and to help prevent falls. What can I do on the outside of my home? Regularly fix the edges of walkways and driveways and fix any cracks. Remove anything that might make you trip as you walk through a door, such as a raised step or threshold. Trim any bushes or trees on the path to your home. Use bright outdoor lighting. Clear any walking paths of anything that might make someone trip, such as rocks or tools. Regularly check to see if handrails are loose or broken. Make sure that both sides of any steps have handrails. Any raised decks and porches should have guardrails on the edges. Have any leaves, snow, or ice cleared regularly. Use sand or salt on walking paths during winter. Clean up any spills in your garage right away. This includes oil or grease spills. What can I do in the bathroom? Use night  lights. Install grab bars by the toilet and in the tub and shower. Do not use towel bars as grab bars. Use non-skid mats or decals in the tub or shower. If you need to sit down in the shower, use a plastic, non-slip stool. Keep the floor dry. Clean up any water that spills on the floor as soon as it happens. Remove soap buildup in the tub or shower regularly. Attach bath mats securely with double-sided non-slip rug tape. Do not have throw rugs and other things on the floor that can make you trip. What can I do in the bedroom? Use night lights. Make sure that you have a light by your bed that is easy to reach. Do not use any sheets or blankets that are too big for your bed. They should not hang down onto the floor. Have a firm chair that has side arms. You can use this for support while you get dressed. Do not have throw rugs and other things on the floor that can make you trip. What can I do in the kitchen? Clean up any spills right away. Avoid walking on wet floors. Keep items that you use a lot in easy-to-reach places. If you need to reach something above you, use a strong step stool that has a grab bar. Keep electrical cords out of the way. Do not use floor polish or wax that makes floors slippery. If you must use wax, use non-skid floor wax. Do not have throw rugs and other things on the floor that can make you trip. What can I  do with my stairs? Do not leave any items on the stairs. Make sure that there are handrails on both sides of the stairs and use them. Fix handrails that are broken or loose. Make sure that handrails are as long as the stairways. Check any carpeting to make sure that it is firmly attached to the stairs. Fix any carpet that is loose or worn. Avoid having throw rugs at the top or bottom of the stairs. If you do have throw rugs, attach them to the floor with carpet tape. Make sure that you have a light switch at the top of the stairs and the bottom of the stairs. If  you do not have them, ask someone to add them for you. What else can I do to help prevent falls? Wear shoes that: Do not have high heels. Have rubber bottoms. Are comfortable and fit you well. Are closed at the toe. Do not wear sandals. If you use a stepladder: Make sure that it is fully opened. Do not climb a closed stepladder. Make sure that both sides of the stepladder are locked into place. Ask someone to hold it for you, if possible. Clearly mark and make sure that you can see: Any grab bars or handrails. First and last steps. Where the edge of each step is. Use tools that help you move around (mobility aids) if they are needed. These include: Canes. Walkers. Scooters. Crutches. Turn on the lights when you go into a dark area. Replace any light bulbs as soon as they burn out. Set up your furniture so you have a clear path. Avoid moving your furniture around. If any of your floors are uneven, fix them. If there are any pets around you, be aware of where they are. Review your medicines with your doctor. Some medicines can make you feel dizzy. This can increase your chance of falling. Ask your doctor what other things that you can do to help prevent falls. This information is not intended to replace advice given to you by your health care provider. Make sure you discuss any questions you have with your health care provider. Document Released: 09/26/2009 Document Revised: 05/07/2016 Document Reviewed: 01/04/2015 Elsevier Interactive Patient Education  2017 [employer].

## 2021-06-04 NOTE — Progress Notes (Addendum)
Subjective:   Christine [name] is a 70 y.o. female who presents for Medicare Annual (Subsequent) preventive examination.  Review of Systems     Cardiac Risk Factors include: advanced age (>34men, >93 women);diabetes mellitus;hypertension;family history of premature cardiovascular disease;dyslipidemia;sedentary lifestyle     Objective:    Today's Vitals   06/04/21 1617  BP: 118/78  Pulse: 82  Temp: 98.1 F (36.7 C)  SpO2: 96%  Weight: 161 lb (73 kg)  Height: 5\' 6"  (1.676 m)  PainSc: 0-No pain   Body mass index is 25.99 kg/m.  Advanced Directives 06/04/2021 06/07/2020 09/01/2019 07/13/2019 02/04/2018  Does Patient Have a Medical Advance Directive? Yes No Yes Yes Yes  Type of Paramedic of [place];Living will - - - [place];Living will  Does patient want to make changes to medical advance directive? No - Patient declined - No - Patient declined No - Patient declined -  Copy of [place] in Chart? No - copy requested - - - -    Current Medications (verified) Outpatient Encounter Medications as of 06/04/2021  Medication Sig   aspirin 81 MG tablet Take 81 mg by mouth daily.   atorvastatin (LIPITOR) 40 MG tablet Take 1 tablet (40 mg total) by mouth daily.   BD PEN NEEDLE NANO 2ND GEN 32G X 4 MM MISC USE ONCE A DAY AS DIRECTED (Patient taking differently: 1 each by Other route daily. E11.9)   cephALEXin (KEFLEX) 250 MG capsule Take 1 capsule (250 mg total) by mouth daily.   DULoxetine (CYMBALTA) 60 MG capsule Take 60 mg by mouth daily.   Insulin Glargine (BASAGLAR KWIKPEN) 100 UNIT/ML Inject 25 Units into the skin every morning.   Lancets (ONETOUCH ULTRASOFT) lancets Check blood sugar once daily. Dx code: 250.00   LORazepam (ATIVAN) 0.5 MG tablet Take 0.5 mg by mouth as needed. For anxiety.   losartan (COZAAR) 100 MG tablet Take 1 tablet (100 mg total) by mouth daily.   meclizine (ANTIVERT) 25 MG tablet Take 1  tablet (25 mg total) by mouth daily as needed for dizziness.   metoprolol tartrate (LOPRESSOR) 100 MG tablet Take one tablet by mouth 2 hours prior to your CT   naproxen (NAPROSYN) 500 MG tablet TAKE 1 TABLET (500 MG TOTAL) BY MOUTH 2 (TWO) TIMES DAILY WITH A MEAL. (Patient taking differently: as needed.)   omeprazole (PRILOSEC) 20 MG capsule TAKE 1 CAPSULE BY MOUTH EVERY DAY   ONETOUCH ULTRA test strip USE 1 STRIP TWICE DAILY DX CODE E11.65   promethazine (PHENERGAN) 12.5 MG tablet Take 1 tablet (12.5 mg total) by mouth every 8 (eight) hours as needed for nausea or vomiting.   Semaglutide (OZEMPIC, 0.25 OR 0.5 MG/DOSE, [place]) Inject 0.25 mg into the skin once a week.   tiZANidine (ZANAFLEX) 2 MG tablet Take 1 tablet (2 mg total) by mouth at bedtime.   traMADol (ULTRAM) 50 MG tablet Take 1 tablet (50 mg total) by mouth every 6 (six) hours as needed.   traZODone (DESYREL) 50 MG tablet TAKE 1/2 TO 1 TABLET BY MOUTH AT BEDTIME AS NEEDED FOR SLEEP (Patient taking differently: Take 50 mg by mouth as needed.)   Vitamin D, Ergocalciferol, (DRISDOL) 1.25 MG (50000 UNIT) CAPS capsule TAKE 1 CAPSULE BY MOUTH ONE TIME PER WEEK   No facility-administered encounter medications on file as of 06/04/2021.    Allergies (verified) Vioxx [rofecoxib], Prednisone, Augmentin [amoxicillin-pot clavulanate], Macrodantin [nitrofurantoin macrocrystal], Sulfa antibiotics, and Colesevelam   History:  Past Medical History:  Diagnosis Date   Acute bursitis of right shoulder 08/29/2018   Right shoulder injected in August 29, 2018   Allergic rhinitis    Closed displaced fracture of fifth metatarsal bone of left foot 03/16/2017   DEPRESSIVE DISORDER NOT ELSEWHERE CLASSIFIED 03/27/2010   Qualifier: Diagnosis of  By: [name] MD, [name]   [name], NP , [facility] [place]     Diabetes ([place]) 04/02/2016   Diabetes mellitus    Essential hypertension 03/14/2008   Qualifier: Diagnosis of  By: [name] MD, [name]      Fibromyalgia 08/12/2011   Diagnosed by [name] , Rheumatologist 2010    GERD (gastroesophageal reflux disease) 12/25/2016   Gluteal tendonitis of right buttock 10/19/2017   Injected in October 19, 2017 Injected October 07, 2018   Greater trochanteric bursitis of left hip 06/17/2016   Greater trochanteric bursitis of right hip 11/27/2015   Injected 11/27/2015    History of recurrent UTIs    [name]   HTN (hypertension)    Hx of osteopenia 10/11/2012   [facility] DEXA; ordered by [name] Lowest T score: -2.1 @ lumbar spine @ [facility] 11/01/12; decrease of 8.5% vs 05/2009. There is 9.2% risk over 10 yrs History fracture left elbow @ 4 Fractured left wrist , right elbow and nose post fall July/18/2013. Dr. [name] No FH Osteoporosis No PMH of bisphosphonate therapy (generic Fosamax Rxed but not filled  due to polypharmacy )    Hyperlipidemia    Hyperlipidemia associated with type 2 diabetes mellitus ([place]) 01/18/2007   Qualifier: Diagnosis of  By: [name]  With DM LDL goal = < 100, ideally < 70. Mi in father @ 76; 2 brothers @ 23 & 17     Lumbar radiculopathy 04/11/2008   Qualifier: Diagnosis of  By: [name] MD, Erick Blinks well to epidural 01/12/2017  repeat epidural given May 2018   Migraine headache    quiescent   Nonallopathic lesion of cervical region 07/19/2014   Nonallopathic lesion of lumbosacral region 01/15/2016   Nonallopathic lesion of sacral region 06/27/2018   Nonallopathic lesion-rib cage 09/26/2014   Palpitations 04/15/2011   Rotator cuff impingement syndrome of left shoulder 05/11/2014   Seasonal allergic rhinitis 03/21/2012   Sinusitis, chronic 04/20/2016   Sleep disorder    Dr Dohmier   Subluxation of peroneal tendon of right foot 11/10/2017   TIA (transient ischemic attack)    [place], HX OF 02/02/2008   Qualifier: Diagnosis of  By: Donalee Citrin ADVRS EFF UNS RX MEDICINAL&BIOLOGICAL [place] 02/02/2008   Qualifier: Diagnosis of  By:  [name] MD, Posey Pronto AND MYOSITIS 02/02/2008   Qualifier: Diagnosis of  By: [name] MD, [name]     UTI (urinary tract infection) 10/05/2014   Viral upper respiratory tract infection with cough 12/31/2014   Vitamin D deficiency 05/25/2008   Qualifier: Diagnosis of  By: [name] MD, [name]     Past Surgical History:  Procedure Laterality Date   COLONOSCOPY     Dr Carlean [name]; hemorrhoids   CORONARY ANGIOPLASTY  1997   for chest pain- negative    POLYPECTOMY  2002   benign, hyperplastic polyp; rectal bleeding 2008   TONSILLECTOMY     TOTAL ABDOMINAL HYSTERECTOMY  1983   BSO for endometriosis   WISDOM TOOTH EXTRACTION     Family History  Problem Relation Age of Onset   Colon cancer  Maternal Aunt    Diabetes Paternal Uncle    Heart attack Father 69   COPD Mother    Lung cancer Brother        smoker   Mental illness Other        niece, committed suicide.   Heart attack Other        brother X 2; @ 94 & 70   Stroke Neg Hx    Social History   Socioeconomic History   Marital status: Married    Spouse name: Not on file   Number of children: Not on file   Years of education: Not on file   Highest education level: Not on file  Occupational History   Occupation: Research scientist (physical sciences)     Employer: [employer]  Tobacco Use   Smoking status: Never   Smokeless tobacco: Never  Vaping Use   Vaping Use: Never used  Substance and Sexual Activity   Alcohol use: No   Drug use: No   Sexual activity: Not on file  Other Topics Concern   Not on file  Social History Narrative   Regular exercise- no    Social Determinants of Health   Financial Resource Strain: Low Risk    Difficulty of Paying Living Expenses: Not hard at all  Food Insecurity: No Food Insecurity   Worried About Charity fundraiser in the Last Year: Never true   [place] in the Last Year: Never true  Transportation Needs: No Transportation Needs   Lack of Transportation (Medical):  No   Lack of Transportation (Non-Medical): No  Physical Activity: Inactive   Days of Exercise per Week: 0 days   Minutes of Exercise per Session: 0 min  Stress: No Stress Concern Present   Feeling of Stress : Not at all  Social Connections: Socially Integrated   Frequency of Communication with Friends and Family: More than three times a week   Frequency of Social Gatherings with Friends and Family: More than three times a week   Attends Religious Services: More than 4 times per year   Active Member of [employer] or Organizations: Yes   Attends Music therapist: More than 4 times per year   Marital Status: Married    Tobacco Counseling Counseling given: Not Answered   Clinical Intake:  Pre-visit preparation completed: Yes  Pain : No/denies pain Pain Score: 0-No pain     BMI - recorded: 25.99 Nutritional Status: BMI 25 -29 Overweight Nutritional Risks: None Diabetes: Yes CBG done?: No Did pt. bring in CBG monitor from home?: No  How often do you need to have someone help you when you read instructions, pamphlets, or other written materials from your doctor or pharmacy?: 1 - Never What is the last grade level you completed in school?: HSG  Diabetic? yes  Interpreter Needed?: No  Information entered by :: Lisette Abu, LPN   Activities of Daily Living In your present state of health, do you have any difficulty performing the following activities: 06/04/2021 01/27/2021  Hearing? N N  Vision? N N  Difficulty concentrating or making decisions? N N  Walking or climbing stairs? Y N  Dressing or bathing? N N  Doing errands, shopping? N N  Preparing Food and eating ? N -  Using the Toilet? N -  In the past six months, have you accidently leaked urine? N -  Do you have problems with loss of bowel control? N -  Managing your Medications? N -  Managing your Finances? N -  Housekeeping or managing your Housekeeping? N -  Some recent data might be hidden     Patient Care Team: [name], MD as PCP - General (Internal Medicine) Belva Crome, MD as PCP - Cardiology (Cardiology) Pieter Partridge, DO as Consulting Physician (Neurology) Charlton Haws, [facility] as Pharmacist (Pharmacist)  Indicate any recent Medical Services you may have received from other than [facility] providers in the past year (date may be approximate).     Assessment:   This is a routine wellness examination for Christine [name].  Hearing/Vision screen Hearing Screening - Comments:: Patient denies any hearing difficulty. Vision Screening - Comments:: Patient wears contacts.  Eye exam done at [facility]. [place]  Dietary issues and exercise activities discussed: Current Exercise Habits: The patient does not participate in regular exercise at present, Exercise limited by: orthopedic condition(s)   Goals Addressed   None   Depression Screen PHQ 2/9 Scores 06/04/2021 04/21/2021 04/23/2020 02/08/2019 05/21/2016 02/22/2015 11/07/2013  PHQ - 2 Score 0 0 0 0 0 0 0    Fall Risk Fall Risk  06/04/2021 04/21/2021 07/12/2020 07/13/2019 02/08/2019  Falls in the past year? 0 0 0 0 0  Number falls in past yr: 0 0 - - -  Injury with Fall? 0 - - - -  Risk for fall due to : No Fall Risks No Fall Risks - - -  Follow up Falls evaluation completed Falls evaluation completed - Falls evaluation completed -    FALL RISK PREVENTION PERTAINING TO THE HOME:  Any stairs in or around the home? No  If so, are there any without handrails? No  Home free of loose throw rugs in walkways, pet beds, electrical cords, etc? Yes  Adequate lighting in your home to reduce risk of falls? Yes   ASSISTIVE DEVICES UTILIZED TO PREVENT FALLS:  Life alert? No  Use of a cane, walker or w/c? No  Grab bars in the bathroom? No  Shower chair or bench in shower? No  Elevated toilet seat or a handicapped toilet? No   TIMED UP AND GO:  Was the test performed? Yes .  Length of time to ambulate 10 feet: 6 sec.    Gait steady and fast without use of assistive device  Cognitive Function:Normal cognitive status assessed by direct observation by this Nurse Health Advisor. No abnormalities found.          Immunizations Immunization History  Administered Date(s) Administered   Fluad Quad(high Dose 65+) 08/11/2019, 11/27/2020   Influenza Split 10/13/2011, 10/11/2012   Influenza Whole 11/17/2007, 09/26/2010   Influenza, High Dose Seasonal PF 09/10/2016, 09/30/2017, 09/08/2018   Influenza,inj,Quad PF,6+ Mos 11/07/2013, 08/29/2014, 09/13/2015   PFIZER(Purple Top)SARS-COV-2 Vaccination 02/11/2020, 03/12/2020, 02/06/2021   Pneumococcal Conjugate-13 12/24/2016   Pneumococcal Polysaccharide-23 02/08/2019   Td 06/07/2009    TDAP status: Due, Education has been provided regarding the importance of this vaccine. Advised may receive this vaccine at local pharmacy or Health Dept. Aware to provide a copy of the vaccination record if obtained from local pharmacy or Health Dept. Verbalized acceptance and understanding.  Flu Vaccine status: Up to date  Pneumococcal vaccine status: Up to date  Covid-19 vaccine status: Completed vaccines  Qualifies for Shingles Vaccine? Yes   Zostavax completed No   Shingrix Completed?: No.    Education has been provided regarding the importance of this vaccine. Patient has been advised to call insurance company to determine out of pocket expense if they have  not yet received this vaccine. Advised may also receive vaccine at local pharmacy or Health Dept. Verbalized acceptance and understanding.  Screening Tests Health Maintenance  Topic Date Due   Zoster Vaccines- Shingrix (1 of 2) Never done   COLONOSCOPY (Pts 45-31yrs Insurance coverage will need to be confirmed)  11/02/2017   OPHTHALMOLOGY EXAM  05/19/2019   TETANUS/TDAP  06/08/2019   COVID-19 Vaccine (4 - Booster for Pfizer series) 05/06/2021   MAMMOGRAM  05/21/2021   INFLUENZA VACCINE  07/14/2021   HEMOGLOBIN A1C   09/19/2021   FOOT EXAM  03/20/2022   DEXA SCAN  Completed   Hepatitis C Screening  Completed   PNA vac Low Risk Adult  Completed   HPV VACCINES  Aged Out    Health Maintenance  Health Maintenance Due  Topic Date Due   Zoster Vaccines- Shingrix (1 of 2) Never done   COLONOSCOPY (Pts 45-70yrs Insurance coverage will need to be confirmed)  11/02/2017   OPHTHALMOLOGY EXAM  05/19/2019   TETANUS/TDAP  06/08/2019   COVID-19 Vaccine (4 - Booster for [place] series) 05/06/2021   MAMMOGRAM  05/21/2021    Colorectal cancer screening: Referral to GI placed 06/04/2021. Pt aware the office will call re: appt.  Mammogram status: Ordered 06/04/2021. Pt provided with contact info and advised to call to schedule appt.   Bone Density status: Ordered 06/04/2021. Pt provided with contact info and advised to call to schedule appt.  Lung Cancer Screening: (Low Dose CT Chest recommended if Age 11-80 years, 30 pack-year currently smoking OR have quit w/in 15years.) does not qualify.   Lung Cancer Screening Referral: no  Additional Screening:  Hepatitis C Screening: does qualify; Completed yes  Vision Screening: Recommended annual ophthalmology exams for early detection of glaucoma and other disorders of the eye. Is the patient up to date with their annual eye exam?  No  Who is the provider or what is the name of the office in which the patient attends annual eye exams? [facility] If pt is not established with a provider, would they like to be referred to a provider to establish care? No .   Dental Screening: Recommended annual dental exams for proper oral hygiene  Community Resource Referral / Chronic Care Management: CRR required this visit?  No   CCM required this visit?  No      Plan:     I have personally reviewed and noted the following in the patient's chart:   Medical and social history Use of alcohol, tobacco or illicit drugs  Current medications and supplements including opioid  prescriptions.  Functional ability and status Nutritional status Physical activity Advanced directives List of other physicians Hospitalizations, surgeries, and ER visits in previous 12 months Vitals Screenings to include cognitive, depression, and falls Referrals and appointments  In addition, I have reviewed and discussed with patient certain preventive protocols, quality metrics, and best practice recommendations. A written personalized care plan for preventive services as well as general preventive health recommendations were provided to patient.     Sheral Flow, LPN   [id number]   Nurse Notes: n/a  Medical screening examination/treatment/procedure(s) were performed by non-physician practitioner and as supervising physician I was immediately available for consultation/collaboration.  I agree with above. Lew Dawes, MD

## 2021-06-05 ENCOUNTER — Other Ambulatory Visit: Payer: Self-pay | Admitting: Endocrinology

## 2021-06-10 ENCOUNTER — Other Ambulatory Visit: Payer: Self-pay | Admitting: Family Medicine

## 2021-06-12 ENCOUNTER — Other Ambulatory Visit: Payer: Self-pay | Admitting: Internal Medicine

## 2021-06-16 ENCOUNTER — Other Ambulatory Visit: Payer: Self-pay | Admitting: Internal Medicine

## 2021-06-17 DIAGNOSIS — Z20822 Contact with and (suspected) exposure to covid-19: Secondary | ICD-10-CM | POA: Diagnosis not present

## 2021-06-19 ENCOUNTER — Ambulatory Visit (INDEPENDENT_AMBULATORY_CARE_PROVIDER_SITE_OTHER): Payer: Medicare Other | Admitting: Endocrinology

## 2021-06-19 ENCOUNTER — Other Ambulatory Visit: Payer: Self-pay

## 2021-06-19 VITALS — BP 118/80 | HR 102 | Ht 66.0 in | Wt 157.4 lb

## 2021-06-19 DIAGNOSIS — Z794 Long term (current) use of insulin: Secondary | ICD-10-CM | POA: Diagnosis not present

## 2021-06-19 DIAGNOSIS — E1151 Type 2 diabetes mellitus with diabetic peripheral angiopathy without gangrene: Secondary | ICD-10-CM | POA: Diagnosis not present

## 2021-06-19 LAB — POCT GLYCOSYLATED HEMOGLOBIN (HGB A1C): Hemoglobin A1C: 6.1 % — AB (ref 4.0–5.6)

## 2021-06-19 MED ORDER — BASAGLAR KWIKPEN 100 UNIT/ML ~~LOC~~ SOPN: 20.0000 [IU] | PEN_INJECTOR | SUBCUTANEOUS | 3 refills | Status: DC

## 2021-06-19 MED ORDER — OZEMPIC (0.25 OR 0.5 MG/DOSE) 2 MG/1.5ML ~~LOC~~ SOPN: 0.5000 mg | PEN_INJECTOR | SUBCUTANEOUS | 3 refills | Status: DC

## 2021-06-19 NOTE — Patient Instructions (Addendum)
check your blood sugar twice a day.  vary the time of day when you check, between before the 3 meals, and at bedtime.  also check if you have symptoms of your blood sugar being too high or too low.  please keep a record of the readings and bring it to your next appointment here.  You can write it on any piece of paper.  please call us sooner if your blood sugar goes below 70, or if you have a lot of readings over 200.   Please reduce the basaglar to 20 units each morning, and continue the same Ozempic.   Please come back for a follow-up appointment in 3 months.

## 2021-06-19 NOTE — Progress Notes (Signed)
Subjective:    Patient ID: Christine [name], female    DOB: 29-Jan-1951, 70 y.o.   MRN: [id number]  HPI Pt returns for f/u of diabetes mellitus: DM type: Insulin-requiring type 2 Dx'ed: 1829 Complications: TIA Therapy: insulin since 2014, and Ozempic.   GDM: never. DKA: never.  Severe hypoglycemia: never.  Pancreatitis: never.   Other: She declines multiple daily injections; pioglitizone was for NASH, but she stopped due to edema; a trial to convert back to oral rx in 2017 was unsuccessful; she declines additional DM rx.   Interval history: she brings a record of her cbg's which I have reviewed today.  Cbg varies from 71-95.  She takes meds as rx'ed.  She continues to lose weight.  Past Medical History:  Diagnosis Date   Acute bursitis of right shoulder 08/29/2018   Right shoulder injected in August 29, 2018   Allergic rhinitis    Closed displaced fracture of fifth metatarsal bone of left foot 03/16/2017   DEPRESSIVE DISORDER NOT ELSEWHERE CLASSIFIED 03/27/2010   Qualifier: Diagnosis of  By: [name] MD, [name]   [name], NP , [facility] [place]     Diabetes ([place]) 04/02/2016   Diabetes mellitus    Essential hypertension 03/14/2008   Qualifier: Diagnosis of  By: [name] MD, [name]     Fibromyalgia 08/12/2011   Diagnosed by [name] , Rheumatologist 2010    GERD (gastroesophageal reflux disease) 12/25/2016   Gluteal tendonitis of right buttock 10/19/2017   Injected in October 19, 2017 Injected October 07, 2018   Greater trochanteric bursitis of left hip 06/17/2016   Greater trochanteric bursitis of right hip 11/27/2015   Injected 11/27/2015    History of recurrent UTIs    [name]   HTN (hypertension)    Hx of osteopenia 10/11/2012   [facility]; ordered by [name] Lowest T score: -2.1 @ lumbar spine @ [facility] 11/01/12; decrease of 8.5% vs 05/2009. There is 9.2% risk over 10 yrs History fracture left elbow @ 4 Fractured left wrist , right elbow and nose  post fall July/18/2013. Dr. [name] No FH Osteoporosis No PMH of bisphosphonate therapy (generic Fosamax Rxed but not filled  due to polypharmacy )    Hyperlipidemia    Hyperlipidemia associated with type 2 diabetes mellitus ([place]) 01/18/2007   Qualifier: Diagnosis of  By: [name]  With DM LDL goal = < 100, ideally < 70. Mi in father @ 30; 2 brothers @ 54 & 48     Lumbar radiculopathy 04/11/2008   Qualifier: Diagnosis of  By: [name] MD, Erick Blinks well to epidural 01/12/2017  repeat epidural given May 2018   Migraine headache    quiescent   Nonallopathic lesion of cervical region 07/19/2014   Nonallopathic lesion of lumbosacral region 01/15/2016   Nonallopathic lesion of sacral region 06/27/2018   Nonallopathic lesion-rib cage 09/26/2014   Palpitations 04/15/2011   Rotator cuff impingement syndrome of left shoulder 05/11/2014   Seasonal allergic rhinitis 03/21/2012   Sinusitis, chronic 04/20/2016   Sleep disorder    Dr Dohmier   Subluxation of peroneal tendon of right foot 11/10/2017   TIA (transient ischemic attack)    [place], HX OF 02/02/2008   Qualifier: Diagnosis of  By: Donalee Citrin ADVRS EFF UNS RX MEDICINAL&BIOLOGICAL [place] 02/02/2008   Qualifier: Diagnosis of  By: [name] MD, Posey Pronto AND MYOSITIS 02/02/2008   Qualifier:  Diagnosis of  By: [name] MD, [name]     UTI (urinary tract infection) 10/05/2014   Viral upper respiratory tract infection with cough 12/31/2014   Vitamin D deficiency 05/25/2008   Qualifier: Diagnosis of  By: [name] MD, [name]      Past Surgical History:  Procedure Laterality Date   COLONOSCOPY     Dr Carlean [name]; hemorrhoids   CORONARY ANGIOPLASTY  1997   for chest pain- negative    POLYPECTOMY  2002   benign, hyperplastic polyp; rectal bleeding 2008   TONSILLECTOMY     TOTAL ABDOMINAL HYSTERECTOMY  1983   BSO for endometriosis   WISDOM TOOTH EXTRACTION      Social History   Socioeconomic  History   Marital status: Married    Spouse name: Not on file   Number of children: Not on file   Years of education: Not on file   Highest education level: Not on file  Occupational History   Occupation: Research scientist (physical sciences)     Employer: [employer]  Tobacco Use   Smoking status: Never   Smokeless tobacco: Never  Vaping Use   Vaping Use: Never used  Substance and Sexual Activity   Alcohol use: No   Drug use: No   Sexual activity: Not on file  Other Topics Concern   Not on file  Social History Narrative   Regular exercise- no    Social Determinants of Health   Financial Resource Strain: Low Risk    Difficulty of Paying Living Expenses: Not hard at all  Food Insecurity: No Food Insecurity   Worried About Charity fundraiser in the Last Year: Never true   Tool in the Last Year: Never true  Transportation Needs: No Transportation Needs   Lack of Transportation (Medical): No   Lack of Transportation (Non-Medical): No  Physical Activity: Inactive   Days of Exercise per Week: 0 days   Minutes of Exercise per Session: 0 min  Stress: No Stress Concern Present   Feeling of Stress : Not at all  Social Connections: Socially Integrated   Frequency of Communication with Friends and Family: More than three times a week   Frequency of Social Gatherings with Friends and Family: More than three times a week   Attends Religious Services: More than 4 times per year   Active Member of [employer] or Organizations: Yes   Attends Music therapist: More than 4 times per year   Marital Status: Married  Human resources officer Violence: Not At Risk   Fear of Current or Ex-Partner: No   Emotionally Abused: No   Physically Abused: No   Sexually Abused: No    Current Outpatient Medications on File Prior to Visit  Medication Sig Dispense Refill   aspirin 81 MG tablet Take 81 mg by mouth daily.     atorvastatin (LIPITOR) 40 MG tablet Take 1 tablet (40 mg total) by  mouth daily. 90 tablet 3   BD PEN NEEDLE NANO 2ND GEN 32G X 4 MM MISC USE ONCE A DAY AS DIRECTED (Patient taking differently: 1 each by Other route daily. E11.9) 90 each 3   cephALEXin (KEFLEX) 250 MG capsule Take 1 capsule (250 mg total) by mouth daily. 90 capsule 1   DULoxetine (CYMBALTA) 60 MG capsule Take 60 mg by mouth daily.     Lancets (ONETOUCH ULTRASOFT) lancets Check blood sugar once daily. Dx code: 250.00 100 each 12   LORazepam (ATIVAN) 0.5 MG tablet Take 0.5  mg by mouth as needed. For anxiety.     losartan (COZAAR) 100 MG tablet Take 1 tablet (100 mg total) by mouth daily. 90 tablet 3   meclizine (ANTIVERT) 25 MG tablet Take 1 tablet (25 mg total) by mouth daily as needed for dizziness. 30 tablet 0   metoprolol tartrate (LOPRESSOR) 100 MG tablet Take one tablet by mouth 2 hours prior to your CT 1 tablet 0   naproxen (NAPROSYN) 500 MG tablet Take 1 tablet (500 mg total) by mouth 2 (two) times daily with a meal. 180 tablet 1   omeprazole (PRILOSEC) 20 MG capsule TAKE 1 CAPSULE BY MOUTH EVERY DAY 90 capsule 0   ONETOUCH ULTRA test strip USE 1 STRIP TWICE DAILY DX CODE E11.65 100 strip 3   promethazine (PHENERGAN) 12.5 MG tablet Take 1 tablet (12.5 mg total) by mouth every 8 (eight) hours as needed for nausea or vomiting. 20 tablet 8   tiZANidine (ZANAFLEX) 2 MG tablet Take 1 tablet (2 mg total) by mouth at bedtime. 90 tablet 1   traMADol (ULTRAM) 50 MG tablet Take 1 tablet (50 mg total) by mouth every 6 (six) hours as needed. 20 tablet 0   traZODone (DESYREL) 50 MG tablet TAKE 1/2 TO 1 TABLET BY MOUTH AT BEDTIME AS NEEDED FOR SLEEP (Patient taking differently: Take 50 mg by mouth as needed.) 90 tablet 1   Vitamin D, Ergocalciferol, (DRISDOL) 1.25 MG (50000 UNIT) CAPS capsule TAKE 1 CAPSULE BY MOUTH ONE TIME PER WEEK 12 capsule 0   No current facility-administered medications on file prior to visit.    Allergies  Allergen Reactions   Vioxx [Rofecoxib] Other (See Comments)    Excess  BP which caused TIA    Prednisone Other (See Comments)    Mental status changes with high-dose oral agent. Tolerates epidural steroid injections. No associated rash or fever   Augmentin [Amoxicillin-Pot Clavulanate]     Diarrhea   Macrodantin [Nitrofurantoin Macrocrystal] Hives   Sulfa Antibiotics     Hives   Colesevelam Other (See Comments)    Leg Pain    Family History  Problem Relation Age of Onset   Colon cancer Maternal Aunt    Diabetes Paternal Uncle    Heart attack Father 88   COPD Mother    Lung cancer Brother        smoker   Mental illness Other        niece, committed suicide.   Heart attack Other        brother X 2; @ 65 & 52   Stroke Neg Hx     BP 118/80 (BP Location: Right Arm, Patient Position: Sitting, Cuff Size: Normal)   Pulse (!) 102   Ht 5\' 6"  (1.676 m)   Wt 157 lb 6.4 oz (71.4 kg)   LMP  (LMP Unknown)   SpO2 95%   BMI 25.41 kg/m    Review of Systems She denies hypoglycemia.       Objective:   Physical Exam Pulses: dorsalis pedis intact bilat.   MSK: no deformity of the feet CV: trace bilat leg edema Skin:  no ulcer on the feet.  normal color and temp on the feet. Neuro: sensation is intact to touch on the feet  Lab Results  Component Value Date   HGBA1C 6.1 (A) 06/19/2021       Assessment & Plan:  Insulin-requiring type 2 DM: overcontrolled Weight loss, due to Ozempic.  We'll follow.   Patient Instructions  check  your blood sugar twice a day.  vary the time of day when you check, between before the 3 meals, and at bedtime.  also check if you have symptoms of your blood sugar being too high or too low.  please keep a record of the readings and bring it to your next appointment here.  You can write it on any piece of paper.  please call us sooner if your blood sugar goes below 70, or if you have a lot of readings over 200.   Please reduce the basaglar to 20 units each morning, and continue the same Ozempic.   Please come back for a  follow-up appointment in 3 months.

## 2021-07-09 ENCOUNTER — Ambulatory Visit (INDEPENDENT_AMBULATORY_CARE_PROVIDER_SITE_OTHER): Payer: Medicare Other | Admitting: Internal Medicine

## 2021-07-09 ENCOUNTER — Other Ambulatory Visit: Payer: Self-pay

## 2021-07-09 ENCOUNTER — Encounter: Payer: Self-pay | Admitting: Internal Medicine

## 2021-07-09 VITALS — BP 132/70 | HR 101 | Temp 98.2°F | Resp 18 | Ht 66.0 in | Wt 159.6 lb

## 2021-07-09 DIAGNOSIS — R42 Dizziness and giddiness: Secondary | ICD-10-CM | POA: Diagnosis not present

## 2021-07-09 DIAGNOSIS — E538 Deficiency of other specified B group vitamins: Secondary | ICD-10-CM | POA: Diagnosis not present

## 2021-07-09 LAB — COMPREHENSIVE METABOLIC PANEL
ALT: 17 U/L (ref 0–35)
AST: 14 U/L (ref 0–37)
Albumin: 4.1 g/dL (ref 3.5–5.2)
Alkaline Phosphatase: 53 U/L (ref 39–117)
BUN: 18 mg/dL (ref 6–23)
CO2: 29 mEq/L (ref 19–32)
Calcium: 9.6 mg/dL (ref 8.4–10.5)
Chloride: 102 mEq/L (ref 96–112)
Creatinine, Ser: 0.79 mg/dL (ref 0.40–1.20)
GFR: 74.9 mL/min (ref >60.00)
Glucose, Bld: 115 mg/dL — ABNORMAL HIGH (ref 70–99)
Potassium: 4.1 mEq/L (ref 3.5–5.1)
Sodium: 138 mEq/L (ref 135–145)
Total Bilirubin: 0.4 mg/dL (ref 0.2–1.2)
Total Protein: 6.8 g/dL (ref 6.0–8.3)

## 2021-07-09 LAB — VITAMIN B12: Vitamin B-12: 478 pg/mL (ref 211–911)

## 2021-07-09 NOTE — Progress Notes (Signed)
   Subjective:   Patient ID: Christine [name], female    DOB: 1951-04-02, 70 y.o.   MRN: [id number]  HPI The patient is a 70 YO female coming in for ongoing dizziness and weakness to her muscles. Most often when she is standing but not immediately on standing. She had heart monitor last year with tachycardia to 130 without symptoms. She was having symptoms recently and checked pulse which was in mid to high 130s and then went down to 80s with sitting. She has not passed out but felt like she could.   Review of Systems  Constitutional:  Positive for activity change. Negative for appetite change.  HENT: Negative.    Eyes: Negative.   Respiratory:  Negative for cough, chest tightness and shortness of breath.   Cardiovascular:  Positive for palpitations. Negative for chest pain and leg swelling.  Gastrointestinal:  Negative for abdominal distention, abdominal pain, constipation, diarrhea, nausea and vomiting.  Musculoskeletal: Negative.   Skin: Negative.   Neurological:  Positive for light-headedness.  Psychiatric/Behavioral: Negative.     Objective:  Physical Exam Constitutional:      Appearance: She is well-developed.  HENT:     Head: Normocephalic and atraumatic.  Cardiovascular:     Rate and Rhythm: Regular rhythm. Tachycardia present.  Pulmonary:     Effort: Pulmonary effort is normal. No respiratory distress.     Breath sounds: Normal breath sounds. No wheezing or rales.  Abdominal:     General: Bowel sounds are normal. There is no distension.     Palpations: Abdomen is soft.     Tenderness: There is no abdominal tenderness. There is no rebound.  Musculoskeletal:     Cervical back: Normal range of motion.  Skin:    General: Skin is warm and dry.  Neurological:     Mental Status: She is alert and oriented to person, place, and time.     Coordination: Coordination normal.    Vitals:   07/09/21 0955  BP: 132/70  Pulse: (!) 101  Resp: 18  Temp: 98.2 F (36.8 C)   TempSrc: Oral  SpO2: 97%  Weight: 159 lb 9.6 oz (72.4 kg)  Height: '5\' 6"'$  (1.676 m)   EKG: Rate 92, axis normal, interval normal, sinus, no st or t wave changes, no significant change compared to prior 05/2020  This visit occurred during the SARS-CoV-2 public health emergency.  Safety protocols were in place, including screening questions prior to the visit, additional usage of staff PPE, and extensive cleaning of exam room while observing appropriate contact time as indicated for disinfecting solutions.   Assessment & Plan:

## 2021-07-09 NOTE — Patient Instructions (Signed)
The EKG looks normal. We will gets labs today and MRI of the head.   If all this looks normal I will do a home heart monitor to see if high heart speed is causing the symptoms.

## 2021-07-11 ENCOUNTER — Encounter: Payer: Self-pay | Admitting: Internal Medicine

## 2021-07-11 NOTE — Assessment & Plan Note (Signed)
EKG done due to palpitations which is normal and unchanged from 2021 and checking MRI brain. Checking B12 level also. If all normal will order 10 day monitor as she has had tachy arthymia in the past and she is having symptoms associated with high HR at home.

## 2021-07-14 ENCOUNTER — Telehealth: Payer: Self-pay

## 2021-07-14 NOTE — Telephone Encounter (Signed)
LVM for pt to let her know that she has 5 boxes of Ozempic here in the office waiting for pick up from [employer].

## 2021-07-14 NOTE — Telephone Encounter (Signed)
Pt came by the office on 07/14/2021 at 2:15pm to pick up her medication

## 2021-07-15 ENCOUNTER — Other Ambulatory Visit: Payer: Self-pay | Admitting: Endocrinology

## 2021-07-15 DIAGNOSIS — Z20828 Contact with and (suspected) exposure to other viral communicable diseases: Secondary | ICD-10-CM | POA: Diagnosis not present

## 2021-07-15 DIAGNOSIS — R519 Headache, unspecified: Secondary | ICD-10-CM | POA: Diagnosis not present

## 2021-07-15 DIAGNOSIS — Z794 Long term (current) use of insulin: Secondary | ICD-10-CM

## 2021-07-15 DIAGNOSIS — E1151 Type 2 diabetes mellitus with diabetic peripheral angiopathy without gangrene: Secondary | ICD-10-CM

## 2021-07-15 DIAGNOSIS — R0981 Nasal congestion: Secondary | ICD-10-CM | POA: Diagnosis not present

## 2021-07-15 DIAGNOSIS — J0100 Acute maxillary sinusitis, unspecified: Secondary | ICD-10-CM | POA: Diagnosis not present

## 2021-07-16 DIAGNOSIS — F339 Major depressive disorder, recurrent, unspecified: Secondary | ICD-10-CM | POA: Diagnosis not present

## 2021-07-16 DIAGNOSIS — F410 Panic disorder [episodic paroxysmal anxiety] without agoraphobia: Secondary | ICD-10-CM | POA: Diagnosis not present

## 2021-07-18 ENCOUNTER — Other Ambulatory Visit: Payer: Self-pay

## 2021-07-18 ENCOUNTER — Encounter: Payer: Self-pay | Admitting: Family Medicine

## 2021-07-18 ENCOUNTER — Ambulatory Visit (INDEPENDENT_AMBULATORY_CARE_PROVIDER_SITE_OTHER): Payer: Medicare Other | Admitting: Family Medicine

## 2021-07-18 VITALS — BP 124/80 | HR 101 | Ht 66.0 in | Wt 157.0 lb

## 2021-07-18 DIAGNOSIS — M9904 Segmental and somatic dysfunction of sacral region: Secondary | ICD-10-CM | POA: Diagnosis not present

## 2021-07-18 DIAGNOSIS — M9902 Segmental and somatic dysfunction of thoracic region: Secondary | ICD-10-CM

## 2021-07-18 DIAGNOSIS — M9901 Segmental and somatic dysfunction of cervical region: Secondary | ICD-10-CM

## 2021-07-18 DIAGNOSIS — M9903 Segmental and somatic dysfunction of lumbar region: Secondary | ICD-10-CM

## 2021-07-18 DIAGNOSIS — M5010 Cervical disc disorder with radiculopathy, unspecified cervical region: Secondary | ICD-10-CM | POA: Diagnosis not present

## 2021-07-18 DIAGNOSIS — M9908 Segmental and somatic dysfunction of rib cage: Secondary | ICD-10-CM | POA: Diagnosis not present

## 2021-07-18 NOTE — Patient Instructions (Addendum)
Good to see you  Stay active See me in 6 weeks

## 2021-07-18 NOTE — Progress Notes (Signed)
[facility] [place] Phone: [phone] Subjective:    I'm seeing this patient by the request  of:  [name], MD  CC: Neck and neck pain follow-up  [id number]  Christine [name] is a 70 y.o. female coming in with complaint of back and neck pain. OMT 06/03/2021. Patient states that her back is doing well.  Very mild irritation.  Patient was concerned that she was going to do a lot of housecleaning with her family coming but then they did not show.  Reviewing patient's chart she has had some headaches and is scheduled for an MRI of the brain and sinuses in the near future.  Has had a couple episodes where she is felt close that she felt dizzy where she was going to fall.  Has had difficulty with this previously but seems to be worsening she states.  Medications patient has been prescribed: Vit D  Taking:VIT D          Past Medical History:  Diagnosis Date   Acute bursitis of right shoulder 08/29/2018   Right shoulder injected in August 29, 2018   Allergic rhinitis    Closed displaced fracture of fifth metatarsal bone of left foot 03/16/2017   DEPRESSIVE DISORDER NOT ELSEWHERE CLASSIFIED 03/27/2010   Qualifier: Diagnosis of  By: [name] MD, [name]   [name], NP , [facility] [place]     Diabetes ([place]) 04/02/2016   Diabetes mellitus    Essential hypertension 03/14/2008   Qualifier: Diagnosis of  By: [name] MD, [name]     Fibromyalgia 08/12/2011   Diagnosed by [name] , Rheumatologist 2010    GERD (gastroesophageal reflux disease) 12/25/2016   Gluteal tendonitis of right buttock 10/19/2017   Injected in October 19, 2017 Injected October 07, 2018   Greater trochanteric bursitis of left hip 06/17/2016   Greater trochanteric bursitis of right hip 11/27/2015   Injected 11/27/2015    History of recurrent UTIs    [name]   HTN (hypertension)    Hx of osteopenia 10/11/2012    [facility]; ordered by [name] Lowest T score: -2.1 @ lumbar spine @ [facility] 11/01/12; decrease of 8.5% vs 05/2009. There is 9.2% risk over 10 yrs History fracture left elbow @ 6 Fractured left wrist , right elbow and nose post fall July/18/2013. Dr. [name] No FH Osteoporosis No PMH of bisphosphonate therapy (generic Fosamax Rxed but not filled  due to polypharmacy )    Hyperlipidemia    Hyperlipidemia associated with type 2 diabetes mellitus ([place]) 01/18/2007   Qualifier: Diagnosis of  By: [name]  With DM LDL goal = < 100, ideally < 70. Mi in father @ 46; 2 brothers @ 66 & 54     Lumbar radiculopathy 04/11/2008   Qualifier: Diagnosis of  By: [name] MD, Erick Blinks well to epidural 01/12/2017  repeat epidural given May 2018   Migraine headache    quiescent   Nonallopathic lesion of cervical region 07/19/2014   Nonallopathic lesion of lumbosacral region 01/15/2016   Nonallopathic lesion of sacral region 06/27/2018   Nonallopathic lesion-rib cage 09/26/2014   Palpitations 04/15/2011   Rotator cuff impingement syndrome of left shoulder 05/11/2014   Seasonal allergic rhinitis 03/21/2012   Sinusitis, chronic 04/20/2016   Sleep disorder    Dr Dohmier   Subluxation of peroneal tendon of right foot 11/10/2017   TIA (transient ischemic attack)    TRANSIENT  ISCHEMIC ATTACKS, HX OF 02/02/2008   Qualifier: Diagnosis of  By: Donalee Citrin ADVRS EFF UNS RX MEDICINAL&BIOLOGICAL [place] 02/02/2008   Qualifier: Diagnosis of  By: [name] MD, Posey Pronto AND MYOSITIS 02/02/2008   Qualifier: Diagnosis of  By: [name] MD, [name]     UTI (urinary tract infection) 10/05/2014   Viral upper respiratory tract infection with cough 12/31/2014   Vitamin D deficiency 05/25/2008   Qualifier: Diagnosis of  By: [name] MD, [name]      Allergies  Allergen Reactions   Vioxx [Rofecoxib] Other (See Comments)    Excess BP which caused TIA    Prednisone Other (See Comments)    Mental status  changes with high-dose oral agent. Tolerates epidural steroid injections. No associated rash or fever   Augmentin [Amoxicillin-Pot Clavulanate]     Diarrhea   Macrodantin [Nitrofurantoin Macrocrystal] Hives   Sulfa Antibiotics     Hives   Colesevelam Other (See Comments)    Leg Pain     Review of Systems:  No  visual changes, nausea, vomiting, diarrhea, constipation, dizziness, abdominal pain, skin rash, fevers, chills, night sweats, weight loss, swollen lymph nodes, body aches, joint swelling, chest pain, shortness of breath, mood changes. POSITIVE muscle aches, dizziness, intermittent headache  Objective  Blood pressure 124/80, pulse (!) 101, height '5\' 6"'$  (1.676 m), weight 157 lb (71.2 kg), SpO2 98 %.   General: No apparent distress alert and oriented x3 mood and affect normal, dressed appropriately.  HEENT: Pupils equal, extraocular movements intact  Respiratory: Patient's speak in full sentences and does not appear short of breath  Cardiovascular: No lower extremity edema, non tender, no erythema  Neck exam does show some tightness noted of the paraspinal musculature of the cervical spine right greater than left.  Tenderness to palpation in this area.  Tenderness in the parascapular region as well right greater than left.  Osteopathic findings  C7 flexed rotated and side bent right T3 extended rotated and side bent right inhaled rib T9 extended rotated and side bent left L2 flexed rotated and side bent right Sacrum right on right       Assessment and Plan:  Cervical disc disorder with radiculopathy of cervical region Degenerative disc disease .  Has responded well to osteopathic manipulation usually.  We will continue to monitor.  And icing regimen.  Home exercises.  Patient is being treated for some of the more pressing issues at the moment.  Patient will have follow-up after these images and other specialist.  Only did muscle energy on the cervical spine secondary to  patient's symptoms.   Nonallopathic problems  Decision today to treat with OMT was based on Physical Exam  After verbal consent patient was treated with  ME, FPR techniques in cervical, rib, thoracic, lumbar, and sacral  areas  Patient tolerated the procedure well with improvement in symptoms  Patient given exercises, stretches and lifestyle modifications  See medications in patient instructions if given  Patient will follow up in 4-8 weeks      The above documentation has been reviewed and is accurate and complete Lyndal Pulley, DO       Note: This dictation was prepared with Dragon dictation along with smaller phrase technology. Any transcriptional errors that result from this process are unintentional.

## 2021-07-18 NOTE — Assessment & Plan Note (Signed)
Degenerative disc disease .  Has responded well to osteopathic manipulation usually.  We will continue to monitor.  And icing regimen.  Home exercises.  Patient is being treated for some of the more pressing issues at the moment.  Patient will have follow-up after these images and other specialist.  Only did muscle energy on the cervical spine secondary to patient's symptoms.

## 2021-07-23 ENCOUNTER — Other Ambulatory Visit: Payer: Self-pay

## 2021-07-23 ENCOUNTER — Ambulatory Visit
Admission: RE | Admit: 2021-07-23 | Discharge: 2021-07-23 | Disposition: A | Discharge status: Home / Self Care | Payer: Medicare Other | Source: Ambulatory Visit | Attending: Internal Medicine | Admitting: Internal Medicine

## 2021-07-23 DIAGNOSIS — I6782 Cerebral ischemia: Secondary | ICD-10-CM | POA: Diagnosis not present

## 2021-07-23 DIAGNOSIS — I639 Cerebral infarction, unspecified: Secondary | ICD-10-CM | POA: Diagnosis not present

## 2021-07-23 DIAGNOSIS — R42 Dizziness and giddiness: Secondary | ICD-10-CM

## 2021-07-23 DIAGNOSIS — G319 Degenerative disease of nervous system, unspecified: Secondary | ICD-10-CM | POA: Diagnosis not present

## 2021-07-24 ENCOUNTER — Encounter: Payer: Self-pay | Admitting: Internal Medicine

## 2021-07-24 ENCOUNTER — Encounter: Payer: Self-pay | Admitting: Family Medicine

## 2021-07-24 DIAGNOSIS — R42 Dizziness and giddiness: Secondary | ICD-10-CM

## 2021-07-25 ENCOUNTER — Ambulatory Visit (INDEPENDENT_AMBULATORY_CARE_PROVIDER_SITE_OTHER): Payer: Medicare Other

## 2021-07-25 DIAGNOSIS — R42 Dizziness and giddiness: Secondary | ICD-10-CM

## 2021-07-25 NOTE — Progress Notes (Unsigned)
Enrolled patient for a 7 day Zio XT monitor to be mailed to patients home.   Dr. Tamala Julian to read

## 2021-07-29 DIAGNOSIS — R42 Dizziness and giddiness: Secondary | ICD-10-CM

## 2021-08-06 ENCOUNTER — Telehealth: Payer: Self-pay | Admitting: Internal Medicine

## 2021-08-06 ENCOUNTER — Other Ambulatory Visit: Payer: Self-pay

## 2021-08-06 ENCOUNTER — Inpatient Hospital Stay (HOSPITAL_BASED_OUTPATIENT_CLINIC_OR_DEPARTMENT_OTHER)
Admission: EM | Admit: 2021-08-06 | Discharge: 2021-08-09 | DRG: 872 | Disposition: A | Discharge status: Home / Self Care | Payer: Medicare Other | Attending: Internal Medicine | Admitting: Internal Medicine

## 2021-08-06 ENCOUNTER — Emergency Department (HOSPITAL_BASED_OUTPATIENT_CLINIC_OR_DEPARTMENT_OTHER): Payer: Medicare Other

## 2021-08-06 ENCOUNTER — Encounter (HOSPITAL_BASED_OUTPATIENT_CLINIC_OR_DEPARTMENT_OTHER): Payer: Self-pay

## 2021-08-06 DIAGNOSIS — R079 Chest pain, unspecified: Secondary | ICD-10-CM | POA: Diagnosis not present

## 2021-08-06 DIAGNOSIS — I248 Other forms of acute ischemic heart disease: Secondary | ICD-10-CM | POA: Diagnosis present

## 2021-08-06 DIAGNOSIS — R3911 Hesitancy of micturition: Secondary | ICD-10-CM

## 2021-08-06 DIAGNOSIS — Z881 Allergy status to other antibiotic agents status: Secondary | ICD-10-CM

## 2021-08-06 DIAGNOSIS — Z882 Allergy status to sulfonamides status: Secondary | ICD-10-CM

## 2021-08-06 DIAGNOSIS — Z8673 Personal history of transient ischemic attack (TIA), and cerebral infarction without residual deficits: Secondary | ICD-10-CM

## 2021-08-06 DIAGNOSIS — A4151 Sepsis due to Escherichia coli [E. coli]: Secondary | ICD-10-CM | POA: Diagnosis not present

## 2021-08-06 DIAGNOSIS — J9811 Atelectasis: Secondary | ICD-10-CM | POA: Diagnosis not present

## 2021-08-06 DIAGNOSIS — Z90722 Acquired absence of ovaries, bilateral: Secondary | ICD-10-CM

## 2021-08-06 DIAGNOSIS — A419 Sepsis, unspecified organism: Principal | ICD-10-CM | POA: Diagnosis present

## 2021-08-06 DIAGNOSIS — R55 Syncope and collapse: Secondary | ICD-10-CM | POA: Diagnosis not present

## 2021-08-06 DIAGNOSIS — Z9079 Acquired absence of other genital organ(s): Secondary | ICD-10-CM

## 2021-08-06 DIAGNOSIS — Z794 Long term (current) use of insulin: Secondary | ICD-10-CM

## 2021-08-06 DIAGNOSIS — K219 Gastro-esophageal reflux disease without esophagitis: Secondary | ICD-10-CM | POA: Diagnosis present

## 2021-08-06 DIAGNOSIS — I10 Essential (primary) hypertension: Secondary | ICD-10-CM | POA: Diagnosis present

## 2021-08-06 DIAGNOSIS — M797 Fibromyalgia: Secondary | ICD-10-CM | POA: Diagnosis present

## 2021-08-06 DIAGNOSIS — Z20822 Contact with and (suspected) exposure to covid-19: Secondary | ICD-10-CM | POA: Diagnosis present

## 2021-08-06 DIAGNOSIS — N390 Urinary tract infection, site not specified: Secondary | ICD-10-CM | POA: Diagnosis present

## 2021-08-06 DIAGNOSIS — E119 Type 2 diabetes mellitus without complications: Secondary | ICD-10-CM

## 2021-08-06 DIAGNOSIS — Z79891 Long term (current) use of opiate analgesic: Secondary | ICD-10-CM

## 2021-08-06 DIAGNOSIS — I700 Atherosclerosis of aorta: Secondary | ICD-10-CM | POA: Diagnosis not present

## 2021-08-06 DIAGNOSIS — R7989 Other specified abnormal findings of blood chemistry: Secondary | ICD-10-CM

## 2021-08-06 DIAGNOSIS — Z7982 Long term (current) use of aspirin: Secondary | ICD-10-CM

## 2021-08-06 DIAGNOSIS — R000 Tachycardia, unspecified: Secondary | ICD-10-CM | POA: Diagnosis not present

## 2021-08-06 DIAGNOSIS — E1169 Type 2 diabetes mellitus with other specified complication: Secondary | ICD-10-CM | POA: Diagnosis not present

## 2021-08-06 DIAGNOSIS — E785 Hyperlipidemia, unspecified: Secondary | ICD-10-CM | POA: Diagnosis present

## 2021-08-06 DIAGNOSIS — Z791 Long term (current) use of non-steroidal anti-inflammatories (NSAID): Secondary | ICD-10-CM

## 2021-08-06 DIAGNOSIS — Z888 Allergy status to other drugs, medicaments and biological substances status: Secondary | ICD-10-CM

## 2021-08-06 DIAGNOSIS — Z87440 Personal history of urinary (tract) infections: Secondary | ICD-10-CM

## 2021-08-06 DIAGNOSIS — R109 Unspecified abdominal pain: Secondary | ICD-10-CM | POA: Diagnosis not present

## 2021-08-06 DIAGNOSIS — R8271 Bacteriuria: Secondary | ICD-10-CM

## 2021-08-06 DIAGNOSIS — Z79899 Other long term (current) drug therapy: Secondary | ICD-10-CM

## 2021-08-06 DIAGNOSIS — Z90710 Acquired absence of both cervix and uterus: Secondary | ICD-10-CM

## 2021-08-06 DIAGNOSIS — R778 Other specified abnormalities of plasma proteins: Secondary | ICD-10-CM

## 2021-08-06 DIAGNOSIS — G43909 Migraine, unspecified, not intractable, without status migrainosus: Secondary | ICD-10-CM | POA: Diagnosis present

## 2021-08-06 DIAGNOSIS — J302 Other seasonal allergic rhinitis: Secondary | ICD-10-CM | POA: Diagnosis present

## 2021-08-06 LAB — CBC WITH DIFFERENTIAL/PLATELET
Abs Immature Granulocytes: 0.06 10*3/uL (ref 0.00–0.07)
Basophils Absolute: 0 10*3/uL (ref 0.0–0.1)
Basophils Relative: 0 %
Eosinophils Absolute: 0.1 10*3/uL (ref 0.0–0.5)
Eosinophils Relative: 0 %
HCT: 37.6 % (ref 36.0–46.0)
Hemoglobin: 12.6 g/dL (ref 12.0–15.0)
Immature Granulocytes: 1 %
Lymphocytes Relative: 11 %
Lymphs Abs: 1.3 10*3/uL (ref 0.7–4.0)
MCH: 30 pg (ref 26.0–34.0)
MCHC: 33.5 g/dL (ref 30.0–36.0)
MCV: 89.5 fL (ref 80.0–100.0)
Monocytes Absolute: 1.1 10*3/uL — ABNORMAL HIGH (ref 0.1–1.0)
Monocytes Relative: 10 %
Neutro Abs: 8.8 10*3/uL — ABNORMAL HIGH (ref 1.7–7.7)
Neutrophils Relative %: 78 %
Platelets: 184 10*3/uL (ref 150–400)
RBC: 4.2 MIL/uL (ref 3.87–5.11)
RDW: 13.1 % (ref 11.5–15.5)
WBC: 11.3 10*3/uL — ABNORMAL HIGH (ref 4.0–10.5)
nRBC: 0 % (ref 0.0–0.2)

## 2021-08-06 LAB — LIPASE, BLOOD: Lipase: 24 U/L (ref 11–51)

## 2021-08-06 LAB — COMPREHENSIVE METABOLIC PANEL
ALT: 21 U/L (ref 0–44)
AST: 15 U/L (ref 15–41)
Albumin: 3.5 g/dL (ref 3.5–5.0)
Alkaline Phosphatase: 60 U/L (ref 38–126)
Anion gap: 8 (ref 5–15)
BUN: 19 mg/dL (ref 8–23)
CO2: 25 mmol/L (ref 22–32)
Calcium: 8.9 mg/dL (ref 8.9–10.3)
Chloride: 100 mmol/L (ref 98–111)
Creatinine, Ser: 0.83 mg/dL (ref 0.44–1.00)
GFR, Estimated: >60 mL/min (ref >60)
Glucose, Bld: 190 mg/dL — ABNORMAL HIGH (ref 70–99)
Potassium: 3.5 mmol/L (ref 3.5–5.1)
Sodium: 133 mmol/L — ABNORMAL LOW (ref 135–145)
Total Bilirubin: 0.9 mg/dL (ref 0.3–1.2)
Total Protein: 6.7 g/dL (ref 6.5–8.1)

## 2021-08-06 LAB — URINALYSIS, ROUTINE W REFLEX MICROSCOPIC
Bilirubin Urine: NEGATIVE
Glucose, UA: NEGATIVE mg/dL
Ketones, ur: 15 mg/dL — AB
Leukocytes,Ua: NEGATIVE
Nitrite: NEGATIVE
Protein, ur: 30 mg/dL — AB
Specific Gravity, Urine: 1.015 (ref 1.005–1.030)
pH: 6 (ref 5.0–8.0)

## 2021-08-06 LAB — URINALYSIS, MICROSCOPIC (REFLEX): WBC, UA: >50 WBC/hpf (ref 0–5)

## 2021-08-06 LAB — LACTIC ACID, PLASMA
Lactic Acid, Venous: 1.2 mmol/L (ref 0.5–1.9)
Lactic Acid, Venous: 1.4 mmol/L (ref 0.5–1.9)

## 2021-08-06 LAB — RESP PANEL BY RT-PCR (FLU A&B, COVID) ARPGX2
Influenza A by PCR: NEGATIVE
Influenza B by PCR: NEGATIVE
SARS Coronavirus 2 by RT PCR: NEGATIVE

## 2021-08-06 LAB — TROPONIN I (HIGH SENSITIVITY)
Troponin I (High Sensitivity): 5 ng/L (ref <18)
Troponin I (High Sensitivity): 19 ng/L — ABNORMAL HIGH (ref <18)

## 2021-08-06 MED ORDER — ACETAMINOPHEN 500 MG PO TABS
1000.0000 mg | ORAL_TABLET | Freq: Once | ORAL | Status: AC
  Administered 2021-08-06: 1000 mg via ORAL
  Filled 2021-08-06: qty 2

## 2021-08-06 MED ORDER — IOHEXOL 300 MG/ML  SOLN
75.0000 mL | Freq: Once | INTRAMUSCULAR | Status: AC | PRN
  Administered 2021-08-06: 75 mL via INTRAVENOUS

## 2021-08-06 MED ORDER — METRONIDAZOLE 500 MG/100ML IV SOLN
500.0000 mg | Freq: Once | INTRAVENOUS | Status: AC
  Administered 2021-08-06: 500 mg via INTRAVENOUS
  Filled 2021-08-06: qty 100

## 2021-08-06 MED ORDER — SODIUM CHLORIDE 0.9 % IV BOLUS (SEPSIS)
250.0000 mL | Freq: Once | INTRAVENOUS | Status: AC
  Administered 2021-08-07: 250 mL via INTRAVENOUS

## 2021-08-06 MED ORDER — SODIUM CHLORIDE 0.9 % IV BOLUS
1000.0000 mL | Freq: Once | INTRAVENOUS | Status: AC
  Administered 2021-08-06: 1000 mL via INTRAVENOUS

## 2021-08-06 MED ORDER — SODIUM CHLORIDE 0.9 % IV SOLN: INTRAVENOUS | Status: DC

## 2021-08-06 MED ORDER — VANCOMYCIN HCL 1250 MG/250ML IV SOLN
1250.0000 mg | INTRAVENOUS | Status: DC
  Filled 2021-08-06: qty 250

## 2021-08-06 MED ORDER — VANCOMYCIN HCL IN DEXTROSE 1-5 GM/200ML-% IV SOLN
1000.0000 mg | Freq: Once | INTRAVENOUS | Status: AC
Start: 2021-08-06 — End: 2021-08-07
  Administered 2021-08-06: 1000 mg via INTRAVENOUS
  Filled 2021-08-06: qty 200

## 2021-08-06 MED ORDER — ONDANSETRON HCL 4 MG/2ML IJ SOLN
4.0000 mg | Freq: Once | INTRAMUSCULAR | Status: AC
  Administered 2021-08-06: 4 mg via INTRAVENOUS
  Filled 2021-08-06: qty 2

## 2021-08-06 MED ORDER — SODIUM CHLORIDE 0.9 % IV SOLN
2.0000 g | Freq: Once | INTRAVENOUS | Status: AC
  Administered 2021-08-06: 2 g via INTRAVENOUS
  Filled 2021-08-06: qty 2

## 2021-08-06 MED ORDER — SODIUM CHLORIDE 0.9 % IV BOLUS (SEPSIS)
1000.0000 mL | Freq: Once | INTRAVENOUS | Status: AC
Start: 2021-08-06 — End: 2021-08-06
  Administered 2021-08-06: 1000 mL via INTRAVENOUS

## 2021-08-06 MED ORDER — SODIUM CHLORIDE 0.9 % IV SOLN: 2.0000 g | Freq: Three times a day (TID) | INTRAVENOUS | Status: DC

## 2021-08-06 NOTE — ED Notes (Signed)
Attempted report x 2 

## 2021-08-06 NOTE — ED Notes (Signed)
Pt reports urinating 2 hours ago. Bladder scanner reads 66cc at this time. MD notified

## 2021-08-06 NOTE — ED Provider Notes (Signed)
[facility] EMERGENCY DEPARTMENT Provider Note   CSN: [id number] Arrival date & time: 08/06/21  1726     History Chief Complaint  Patient presents with  . Loss of Consciousness    Christine [name] is a 70 y.o. female with PMHx HTN, Diabetes, Migraine, Fibromyalgia, GERD, HLD, HTN who presents to the ED today with complaint of syncopal episode. Pt reports that yesterday she began experiencing a migraine headache - she reports hx of same. She took her regular oral phenergan and tramadol without much relief. She reports she did not think much of it however a couple of hours later began experiencing chills. Pt states that she was sitting at her bar at home last night and believes she may have passed out as she found her gingerale spilled on the floor. She did not fall or hit head head at all. Pt states that she then began experiencing dizziness and felt like she needed to crawl on the ground - she does endorse hx of intermittent dizziness for the past several months and has had a negative MRI recently for same. Pt's husband reports she also had a fever last night with tmax 102.0. Pt last took Tylenol around 12 PM today. She mentions that she typically has bacterial colonization in her urine and was last on abx about 3 weeks ago - she reports whenever she is not on antibiotics she will have a foul smell to her urine which she has been having recently. Pt also mentions she has not urinated much today. She last had a BM 2 days ago which is normal for her. Pt also complains of left sided chest pain underneath left breast. She states it is painful to the touch. She denies any trauma or fall. Denies cough. Denies abdominal pain - does report she has "typical gas pain" for the ozempic that she takes. Pt did do an at home antigen COVID test today which was negative. Denies any recent sick contacts.   The history is provided by the patient, the spouse and medical records.      Past Medical  History:  Diagnosis Date  . Acute bursitis of right shoulder 08/29/2018   Right shoulder injected in August 29, 2018  . Allergic rhinitis   . Closed displaced fracture of fifth metatarsal bone of left foot 03/16/2017  . DEPRESSIVE DISORDER NOT ELSEWHERE CLASSIFIED 03/27/2010   Qualifier: Diagnosis of  By: [name] MD, [name]   [name], NP , [facility] [place]    . Diabetes ([place]) 04/02/2016  . Diabetes mellitus   . Essential hypertension 03/14/2008   Qualifier: Diagnosis of  By: [name] MD, [name]    . Fibromyalgia 08/12/2011   Diagnosed by [name] , Rheumatologist 2010   . GERD (gastroesophageal reflux disease) 12/25/2016  . Gluteal tendonitis of right buttock 10/19/2017   Injected in October 19, 2017 Injected October 07, 2018  . Greater trochanteric bursitis of left hip 06/17/2016  . Greater trochanteric bursitis of right hip 11/27/2015   Injected 11/27/2015   . History of recurrent UTIs    [name]  . HTN (hypertension)   . Hx of osteopenia 10/11/2012   [facility] DEXA; ordered by [name] Lowest T score: -2.1 @ lumbar spine @ [facility] 11/01/12; decrease of 8.5% vs 05/2009. There is 9.2% risk over 10 yrs History fracture left elbow @ 4 Fractured left wrist , right elbow and nose post fall July/18/2013. Dr. [name] No FH Osteoporosis No PMH of bisphosphonate  therapy (generic Fosamax Rxed but not filled  due to polypharmacy )   . Hyperlipidemia   . Hyperlipidemia associated with type 2 diabetes mellitus ([place]) 01/18/2007   Qualifier: Diagnosis of  By: [name]  With DM LDL goal = < 100, ideally < 70. Mi in father @ 77; 2 brothers @ 78 & 63    . Lumbar radiculopathy 04/11/2008   Qualifier: Diagnosis of  By: [name] MD, Erick Blinks well to epidural 01/12/2017  repeat epidural given May 2018  . Migraine headache    quiescent  . Nonallopathic lesion of cervical region 07/19/2014  . Nonallopathic lesion of lumbosacral region 01/15/2016  . Nonallopathic lesion of sacral  region 06/27/2018  . Nonallopathic lesion-rib cage 09/26/2014  . Palpitations 04/15/2011  . Rotator cuff impingement syndrome of left shoulder 05/11/2014  . Seasonal allergic rhinitis 03/21/2012  . Sinusitis, chronic 04/20/2016  . Sleep disorder    [name] May  . Subluxation of peroneal tendon of right foot 11/10/2017  . TIA (transient ischemic attack)   . TRANSIENT ISCHEMIC ATTACKS, HX OF 02/02/2008   Qualifier: Diagnosis of  By: [name]    . UNS ADVRS EFF UNS RX MEDICINAL&BIOLOGICAL SBSTNC 02/02/2008   Qualifier: Diagnosis of  By: [name] MD, [name]    . UNSPECIFIED MYALGIA AND MYOSITIS 02/02/2008   Qualifier: Diagnosis of  By: [name] MD, [name]    . UTI (urinary tract infection) 10/05/2014  . Viral upper respiratory tract infection with cough 12/31/2014  . Vitamin D deficiency 05/25/2008   Qualifier: Diagnosis of  By: [name] MD, [name]      Patient Active Problem List   Diagnosis Date Noted  . Sepsis ([place]) 08/06/2021  . Dizziness 04/22/2021  . Arthritis of right hip 02/12/2021  . Leg swelling 07/12/2020  . Cervical disc disorder with radiculopathy of cervical region 01/25/2020  . Coronary atherosclerosis of native coronary artery 01/25/2020  . Nonallopathic lesion of sacral region 06/27/2018  . Urinary problem 03/08/2018  . Subluxation of peroneal tendon of right foot 11/10/2017  . Lumbar radiculopathy, right 06/23/2017  . Closed displaced fracture of fifth metatarsal bone of left foot 03/16/2017  . GERD (gastroesophageal reflux disease) 12/25/2016  . Greater trochanteric bursitis, right 06/17/2016  . Migraine 05/06/2016  . Diabetes ([place]) 04/02/2016  . Nonallopathic lesion of lumbosacral region 01/15/2016  . Routine general medical examination at a health care facility 11/25/2015  . Nonallopathic lesion of thoracic region 09/26/2014  . Nonallopathic lesion-rib cage 09/26/2014  . Nonallopathic lesion of cervical region 07/19/2014  . Hx of osteopenia 10/11/2012  .  Seasonal allergic rhinitis 03/21/2012  . Fibromyalgia 08/12/2011  . Palpitations 04/15/2011  . DEPRESSIVE DISORDER NOT ELSEWHERE CLASSIFIED 03/27/2010  . Vitamin D deficiency 05/25/2008  . Lumbar radiculopathy 04/11/2008  . Essential hypertension 03/14/2008  . History of cardiovascular disorder 02/02/2008  . Hyperlipidemia associated with type 2 diabetes mellitus ([place]) 01/18/2007    Past Surgical History:  Procedure Laterality Date  . COLONOSCOPY     Dr Carlean [name]; hemorrhoids  . CORONARY ANGIOPLASTY  1997   for chest pain- negative   . POLYPECTOMY  2002   benign, hyperplastic polyp; rectal bleeding 2008  . TONSILLECTOMY    . TOTAL ABDOMINAL HYSTERECTOMY  1983   BSO for endometriosis  . WISDOM TOOTH EXTRACTION       OB History   No obstetric history on file.     Family History  Problem Relation Age of Onset  . Colon cancer Maternal Aunt   .  Diabetes Paternal Uncle   . Heart attack Father 40  . COPD Mother   . Lung cancer Brother        smoker  . Mental illness Other        niece, committed suicide.  [name] Heart attack Other        brother X 2; @ 14 & 89  . Stroke Neg Hx     Social History   Tobacco Use  . Smoking status: Never  . Smokeless tobacco: Never  Vaping Use  . Vaping Use: Never used  Substance Use Topics  . Alcohol use: No  . Drug use: No    Home Medications Prior to Admission medications   Medication Sig Start Date End Date Taking? Authorizing Provider  aspirin 81 MG tablet Take 81 mg by mouth daily.    [provider]  atorvastatin (LIPITOR) 40 MG tablet Take 1 tablet (40 mg total) by mouth daily. 01/27/21   [name], MD  BD PEN NEEDLE NANO 2ND GEN 32G X 4 MM MISC USE ONCE A DAY AS DIRECTED 07/15/21   Renato Shin, MD  cephALEXin (KEFLEX) 250 MG capsule Take 1 capsule (250 mg total) by mouth daily. 01/27/21   [name], MD  DULoxetine (CYMBALTA) 60 MG capsule Take 60 mg by mouth daily.    [provider]   Insulin Glargine (BASAGLAR KWIKPEN) 100 UNIT/ML Inject 20 Units into the skin every morning. 06/19/21   Renato Shin, MD  Lancets [facility] ULTRASOFT) lancets Check blood sugar once daily. Dx code: 250.00 11/08/13   Hendricks Limes, MD  LORazepam (ATIVAN) 0.5 MG tablet Take 0.5 mg by mouth as needed. For anxiety.    [provider]  losartan (COZAAR) 100 MG tablet Take 1 tablet (100 mg total) by mouth daily. 05/02/21   [name], MD  meclizine (ANTIVERT) 25 MG tablet Take 1 tablet (25 mg total) by mouth daily as needed for dizziness. 04/21/21   [name], MD  naproxen (NAPROSYN) 500 MG tablet Take 1 tablet (500 mg total) by mouth 2 (two) times daily with a meal. 06/13/21   Lyndal Pulley, DO  omeprazole (PRILOSEC) 20 MG capsule TAKE 1 CAPSULE BY MOUTH EVERY DAY 06/13/21   [name], MD  [facility] ULTRA test strip USE 1 STRIP TWICE DAILY DX CODE E11.65 06/06/21   Renato Shin, MD  promethazine (PHENERGAN) 12.5 MG tablet Take 1 tablet (12.5 mg total) by mouth every 8 (eight) hours as needed for nausea or vomiting. 04/21/21   [name], MD  Semaglutide,0.25 or 0.'5MG'$ /DOS, (OZEMPIC, 0.25 OR 0.5 MG/DOSE,) 2 MG/1.5ML SOPN Inject 0.5 mg into the skin once a week. 06/19/21   Renato Shin, MD  tiZANidine (ZANAFLEX) 2 MG tablet Take 1 tablet (2 mg total) by mouth at bedtime. 01/28/21   Lyndal Pulley, DO  traMADol (ULTRAM) 50 MG tablet Take 1 tablet (50 mg total) by mouth every 6 (six) hours as needed. 04/21/21   [name], MD  traZODone (DESYREL) 50 MG tablet TAKE 1/2 TO 1 TABLET BY MOUTH AT BEDTIME AS NEEDED FOR SLEEP Patient taking differently: Take 50 mg by mouth as needed. 07/08/20   [name], MD  Vitamin D, Ergocalciferol, (DRISDOL) 1.25 MG (50000 UNIT) CAPS capsule TAKE 1 CAPSULE BY MOUTH ONE TIME PER WEEK 06/13/21   Lyndal Pulley, DO    Allergies    Vioxx [rofecoxib], Prednisone, Macrodantin [nitrofurantoin macrocrystal], Sulfa  antibiotics, and Colesevelam  Review of Systems   Review of Systems  Constitutional:  Positive for chills, fatigue and fever.  Eyes:  Negative for visual disturbance.  Respiratory:  Negative for cough.   Cardiovascular:  Positive for chest pain.  Gastrointestinal:  Negative for abdominal pain, diarrhea, nausea and vomiting.  Genitourinary:  Positive for decreased urine volume.  Neurological:  Positive for dizziness (chronic), syncope and headaches.  All other systems reviewed and are negative.  Physical Exam Updated Vital Signs BP (!) 117/102 (BP Location: Left Arm)   Pulse (!) 117   Temp 99.3 F (37.4 C) (Oral)   Resp 20   Ht '5\' 6"'$  (1.676 m)   Wt 71.2 kg   LMP  (LMP Unknown)   SpO2 99%   BMI 25.34 kg/m   Physical Exam Vitals and nursing note reviewed.  Constitutional:      Appearance: She is not ill-appearing or diaphoretic.     Comments: Warm to the touch  HENT:     Head: Normocephalic and atraumatic.  Eyes:     Conjunctiva/sclera: Conjunctivae normal.  Cardiovascular:     Rate and Rhythm: Regular rhythm. Tachycardia present.  Pulmonary:     Effort: Pulmonary effort is normal.     Breath sounds: Normal breath sounds. No wheezing, rhonchi or rales.  Chest:     Chest wall: Tenderness present.    Abdominal:     Palpations: Abdomen is soft.     Tenderness: There is no abdominal tenderness. There is no right CVA tenderness, left CVA tenderness, guarding or rebound.  Musculoskeletal:     Cervical back: Neck supple.  Skin:    General: Skin is warm and dry.  Neurological:     Mental Status: She is alert.    ED Results / Procedures / Treatments   Labs (all labs ordered are listed, but only abnormal results are displayed) Labs Reviewed  COMPREHENSIVE METABOLIC PANEL - Abnormal; Notable for the following components:      Result Value   Sodium 133 (*)    Glucose, Bld 190 (*)    All other components within normal limits  CBC WITH DIFFERENTIAL/PLATELET - Abnormal;  Notable for the following components:   WBC 11.3 (*)    Neutro Abs 8.8 (*)    Monocytes Absolute 1.1 (*)    All other components within normal limits  URINALYSIS, ROUTINE W REFLEX MICROSCOPIC - Abnormal; Notable for the following components:   APPearance CLOUDY (*)    Hgb urine dipstick MODERATE (*)    Ketones, ur 15 (*)    Protein, ur 30 (*)    All other components within normal limits  URINALYSIS, MICROSCOPIC (REFLEX) - Abnormal; Notable for the following components:   Bacteria, UA MANY (*)    All other components within normal limits  RESP PANEL BY RT-PCR (FLU A&B, COVID) [id number]  CULTURE, BLOOD (ROUTINE X 2)  CULTURE, BLOOD (ROUTINE X 2)  URINE CULTURE  LIPASE, BLOOD  LACTIC ACID, PLASMA  LACTIC ACID, PLASMA  TROPONIN I (HIGH SENSITIVITY)  TROPONIN I (HIGH SENSITIVITY)    EKG EKG Interpretation  Date/Time:  Wednesday August 06 2021 17:46:46 EDT Ventricular Rate:  113 PR Interval:  188 QRS Duration: 99 QT Interval:  319 QTC Calculation: 438 R Axis:   39 Text Interpretation: Sinus tachycardia No significant change since last tracing Confirmed by Blanchie Dessert [id number]) on 08/06/2021 6:42:25 PM  Radiology CT Chest W Contrast  Result Date: 08/06/2021 CLINICAL DATA:  Abdominal pain and fever. Syncopal episode yesterday. Fever and  chills. Pain to the left breast area. EXAM: CT CHEST, ABDOMEN, AND PELVIS WITH CONTRAST TECHNIQUE: Multidetector CT imaging of the chest, abdomen and pelvis was performed following the standard protocol during bolus administration of intravenous contrast. CONTRAST:  71m OMNIPAQUE IOHEXOL 300 MG/ML  SOLN COMPARISON:  CT abdomen and pelvis 04/09/2008. Cardiac CT chest 12/19/2019 FINDINGS: CT CHEST FINDINGS Cardiovascular: Normal heart size. No pericardial effusion. Coronary artery and aortic calcification. No aortic aneurysm. Great vessel origins are patent. Mediastinum/Nodes: Thyroid gland is unremarkable. Esophagus is decompressed. No significant  lymphadenopathy. Lungs/Pleura: Linear atelectasis in the lung bases. No airspace disease or consolidation. No pleural effusions. No pneumothorax. Musculoskeletal: No acute bony abnormalities. CT ABDOMEN PELVIS FINDINGS Hepatobiliary: No focal liver abnormality is seen. No gallstones, gallbladder wall thickening, or biliary dilatation. Pancreas: Unremarkable. No pancreatic ductal dilatation or surrounding inflammatory changes. Spleen: Normal in size without focal abnormality. Adrenals/Urinary Tract: Adrenal glands are unremarkable. Kidneys are normal, without renal calculi, focal lesion, or hydronephrosis. Bladder is unremarkable. Stomach/Bowel: Stomach, small bowel, and colon are not abnormally distended. No wall thickening or inflammatory changes are appreciated. Appendix is normal. Vascular/Lymphatic: Calcification of the aorta. No aneurysm. No significant lymphadenopathy. Reproductive: Status post hysterectomy. No adnexal masses. Other: No free air or free fluid. Abdominal wall musculature appears intact. Musculoskeletal: No acute bony abnormalities. IMPRESSION: 1. No acute abnormalities demonstrated in the chest, abdomen, or pelvis. 2. Aortic atherosclerosis. 3. Linear atelectasis in the lung bases. Electronically Signed   By: WLucienne CapersM.D.   On: 08/06/2021 21:11   CT Abdomen Pelvis W Contrast  Result Date: 08/06/2021 CLINICAL DATA:  Abdominal pain and fever. Syncopal episode yesterday. Fever and chills. Pain to the left breast area. EXAM: CT CHEST, ABDOMEN, AND PELVIS WITH CONTRAST TECHNIQUE: Multidetector CT imaging of the chest, abdomen and pelvis was performed following the standard protocol during bolus administration of intravenous contrast. CONTRAST:  723mOMNIPAQUE IOHEXOL 300 MG/ML  SOLN COMPARISON:  CT abdomen and pelvis 04/09/2008. Cardiac CT chest 12/19/2019 FINDINGS: CT CHEST FINDINGS Cardiovascular: Normal heart size. No pericardial effusion. Coronary artery and aortic calcification. No  aortic aneurysm. Great vessel origins are patent. Mediastinum/Nodes: Thyroid gland is unremarkable. Esophagus is decompressed. No significant lymphadenopathy. Lungs/Pleura: Linear atelectasis in the lung bases. No airspace disease or consolidation. No pleural effusions. No pneumothorax. Musculoskeletal: No acute bony abnormalities. CT ABDOMEN PELVIS FINDINGS Hepatobiliary: No focal liver abnormality is seen. No gallstones, gallbladder wall thickening, or biliary dilatation. Pancreas: Unremarkable. No pancreatic ductal dilatation or surrounding inflammatory changes. Spleen: Normal in size without focal abnormality. Adrenals/Urinary Tract: Adrenal glands are unremarkable. Kidneys are normal, without renal calculi, focal lesion, or hydronephrosis. Bladder is unremarkable. Stomach/Bowel: Stomach, small bowel, and colon are not abnormally distended. No wall thickening or inflammatory changes are appreciated. Appendix is normal. Vascular/Lymphatic: Calcification of the aorta. No aneurysm. No significant lymphadenopathy. Reproductive: Status post hysterectomy. No adnexal masses. Other: No free air or free fluid. Abdominal wall musculature appears intact. Musculoskeletal: No acute bony abnormalities. IMPRESSION: 1. No acute abnormalities demonstrated in the chest, abdomen, or pelvis. 2. Aortic atherosclerosis. 3. Linear atelectasis in the lung bases. Electronically Signed   By: WiLucienne Capers.D.   On: 08/06/2021 21:11   DG Chest Port 1 View  Result Date: 08/06/2021 CLINICAL DATA:  Syncope EXAM: PORTABLE CHEST 1 VIEW COMPARISON:  07/09/2020 FINDINGS: The heart size and mediastinal contours are within normal limits. Both lungs are clear. The visualized skeletal structures are unremarkable. IMPRESSION: No active disease. Electronically Signed   By: KeLennette Bihari  [place] M.D.   On: 08/06/2021 18:46    Procedures .Critical Care  Date/Time: 08/06/2021 9:45 PM Performed by: Eustaquio Maize, PA-C Authorized by: Eustaquio Maize, PA-C   Critical care provider statement:    Critical care time (minutes):  45   Critical care was necessary to treat or prevent imminent or life-threatening deterioration of the following conditions:  Sepsis   Critical care was time spent personally by me on the following activities:  Discussions with consultants, evaluation of patient's response to treatment, examination of patient, ordering and performing treatments and interventions, ordering and review of laboratory studies, ordering and review of radiographic studies, pulse oximetry, re-evaluation of patient's condition, obtaining history from patient or surrogate and review of old charts   Medications Ordered in ED Medications  0.9 %  sodium chloride infusion ( Intravenous New Bag/Given 08/06/21 2018)  sodium chloride 0.9 % bolus 1,000 mL (has no administration in time range)    And  sodium chloride 0.9 % bolus 250 mL (has no administration in time range)  metroNIDAZOLE (FLAGYL) IVPB 500 mg (has no administration in time range)  vancomycin (VANCOCIN) IVPB 1000 mg/200 mL premix (has no administration in time range)  ondansetron (ZOFRAN) injection 4 mg (has no administration in time range)  sodium chloride 0.9 % bolus 1,000 mL (0 mLs Intravenous Stopped 08/06/21 1940)  acetaminophen (TYLENOL) tablet 1,000 mg (1,000 mg Oral Given 08/06/21 1954)  ceFEPIme (MAXIPIME) 2 g in sodium chloride 0.9 % 100 mL IVPB (0 g Intravenous Stopped 08/06/21 2133)  iohexol (OMNIPAQUE) 300 MG/ML solution 75 mL (75 mLs Intravenous Contrast Given 08/06/21 2037)    ED Course  I have reviewed the triage vital signs and the nursing notes.  Pertinent labs & imaging results that were available during my care of the patient were reviewed by me and considered in my medical decision making (see chart for details).  Clinical Course as of 08/06/21 2146  Wed Aug 06, 2021  1859 Temp(!): 103 F (39.4 C) Rectal [MV]    Clinical Course User Index [MV] Eustaquio Maize, [place]   MDM Rules/Calculators/A&P                           70 year old female presenting to the ED today with complaint of fever with tmax 102.0, chills, pain to left chest wall, decreased urine output, and syncope that began yesterday. On arrival to the ED oral temp 99.3, last took Tylenol around noon today. Pt is tachycardic at 117, nontachypneic. Speaking in full sentences without difficulty and satting 99% on RA. Pt is warm to the touch on exam - will plan for rectal temp given age. She has TTP to the left chest underneath breath; no abdominal TTP. Her LCTAB. She does endorse syncopal episode yesterday and dizziness however her dizziness has been a chronic issue for her - she is being evaluated by cardiology for same; wore zio monitor (unable to see results) and recent MRI on 08/10 which was negative. Most suspicious for infection causing symptoms today. Will plan to check urine, CXR, and COVID/flu test at this time. Pt endorses decreased urine today - will plan for bladder scan.   Bladder scan 66 Rectal temp 103.0 - will provide tylenol at this time. Fluids running. Concern for SEPSIS vs COVID/Flu at this time. Will start on abx and obtain cultures  CXR clear CBC with mild leukocytosis 11,300 without left shift. Hgb stable.  CMP without electrolyte abnormalities Lactic  acid 1.2 Lipase 24 Troponin of 5  Awaiting U/A at this time. Given left sided rib pain and unequivocal xray with fever will plan for CT chest, abdomen, and pelvis.   U/A without leuks or nitrites however 21-50 RBCs and > 50 WBCs per HPF with many bacteria.  CT scans negative at this time  Pt continues to be tachycardic in the ED with rates in the 120s. Currently on 30 cc/kg bolus with abx for unknown cause however appears to be related to a UTI. Pt needs to be admitted given continued tachycardic and significantly elevated temperature today. Attending physician Dr. Maryan Rued has evaluated patient as well and agrees  with plan.   This note was prepared using Dragon voice recognition software and may include unintentional dictation errors due to the inherent limitations of voice recognition software.   Final Clinical Impression(s) / ED Diagnoses Final diagnoses:  Sepsis without acute organ dysfunction, due to unspecified organism [facility])  Lower urinary tract infectious disease    Rx / DC Orders ED Discharge Orders     None        Eustaquio Maize, PA-C 08/06/21 2146    Blanchie Dessert, MD 08/07/21 0000

## 2021-08-06 NOTE — Telephone Encounter (Signed)
Pt is having chills, fatigue, pain under L breast, and 100.7 temp. Offered a VV due to fever but patient declined. Preferred to be seen in person so she is going to urgent care.

## 2021-08-06 NOTE — Sepsis Progress Note (Signed)
Following per sepsis protocol   

## 2021-08-06 NOTE — ED Notes (Signed)
Patient transported to CT 

## 2021-08-06 NOTE — ED Notes (Signed)
Patient appeared to have desaturations on the monitor into the 70's. Patient had good color, pulse ox readjusted, and waveform appropriate. O2 99-100% on RA.

## 2021-08-06 NOTE — ED Notes (Signed)
This nurse attempted second line and pt vomited. Pt gown and linens changed. Pt is confused to situation. Provider made aware

## 2021-08-06 NOTE — ED Triage Notes (Addendum)
Pt c/o migraine and syncopal episode yesterday-states fever/chills, pain to left breast area started last night-last dose tylenol at 11am-NAD/shaking-slow gait

## 2021-08-06 NOTE — Progress Notes (Signed)
Pharmacy Antibiotic Note  Christine [name] is a 70 y.o. female admitted on 08/06/2021 with sepsis.  Pharmacy has been consulted for vancomycin and cefepime dosing.  Patient with a history of HTN, Diabetes, Migraine, Fibromyalgia, GERD, HLD, HTN. Patient presented to ED following syncopal episode. Patient with subjective fevers at home. Rectal temp in ED 103.  CXR noted to be clear in ED.  SCr is 0.83 which is at baseline. WBC 11.3; LA 1.2  Plan: Flagyl per MD Cefepime 2g q8h Vancomycin 1000 mg once given in ED then start 1250 mg q24h unless change in renal function (eAUC 468.9) Monitor renal function F/u cultures De-escalate when appropriate  Height: '5\' 6"'$  (167.6 cm) Weight: 71.2 kg (157 lb) IBW/kg (Calculated) : 59.3  Temp (24hrs), Avg:101.2 F (38.4 C), Min:99.3 F (37.4 C), Max:103 F (39.4 C)  Recent Labs  Lab 08/06/21 1820  WBC 11.3*  CREATININE 0.83  LATICACIDVEN 1.2    Estimated Creatinine Clearance: 62 mL/min (by C-G formula based on SCr of 0.83 mg/dL).    Allergies  Allergen Reactions   Vioxx [Rofecoxib] Other (See Comments)    Excess BP which caused TIA    Prednisone Other (See Comments)    Mental status changes with high-dose oral agent. Tolerates epidural steroid injections. No associated rash or fever   Macrodantin [Nitrofurantoin Macrocrystal] Hives   Sulfa Antibiotics     Hives   Colesevelam Other (See Comments)    Leg Pain    Antimicrobials this admission: cefepime 8/24 >>  vancomycin 8/24 >>   Microbiology results: Pending  Thank you for allowing pharmacy to be a part of this patient's care.  Lorelei Pont, PharmD, BCPS 08/06/2021 10:09 PM ED Clinical Pharmacist -  [phone]

## 2021-08-07 ENCOUNTER — Encounter (HOSPITAL_COMMUNITY): Payer: Self-pay | Admitting: Family Medicine

## 2021-08-07 DIAGNOSIS — R778 Other specified abnormalities of plasma proteins: Secondary | ICD-10-CM

## 2021-08-07 DIAGNOSIS — Z79899 Other long term (current) drug therapy: Secondary | ICD-10-CM | POA: Diagnosis not present

## 2021-08-07 DIAGNOSIS — A419 Sepsis, unspecified organism: Secondary | ICD-10-CM | POA: Diagnosis not present

## 2021-08-07 DIAGNOSIS — K219 Gastro-esophageal reflux disease without esophagitis: Secondary | ICD-10-CM | POA: Diagnosis present

## 2021-08-07 DIAGNOSIS — Z881 Allergy status to other antibiotic agents status: Secondary | ICD-10-CM | POA: Diagnosis not present

## 2021-08-07 DIAGNOSIS — N390 Urinary tract infection, site not specified: Secondary | ICD-10-CM | POA: Diagnosis present

## 2021-08-07 DIAGNOSIS — E119 Type 2 diabetes mellitus without complications: Secondary | ICD-10-CM

## 2021-08-07 DIAGNOSIS — Z794 Long term (current) use of insulin: Secondary | ICD-10-CM | POA: Diagnosis not present

## 2021-08-07 DIAGNOSIS — Z20822 Contact with and (suspected) exposure to covid-19: Secondary | ICD-10-CM | POA: Diagnosis present

## 2021-08-07 DIAGNOSIS — I248 Other forms of acute ischemic heart disease: Secondary | ICD-10-CM | POA: Diagnosis present

## 2021-08-07 DIAGNOSIS — R8271 Bacteriuria: Secondary | ICD-10-CM

## 2021-08-07 DIAGNOSIS — E785 Hyperlipidemia, unspecified: Secondary | ICD-10-CM | POA: Diagnosis present

## 2021-08-07 DIAGNOSIS — R3911 Hesitancy of micturition: Secondary | ICD-10-CM

## 2021-08-07 DIAGNOSIS — I10 Essential (primary) hypertension: Secondary | ICD-10-CM | POA: Diagnosis present

## 2021-08-07 DIAGNOSIS — J302 Other seasonal allergic rhinitis: Secondary | ICD-10-CM | POA: Diagnosis present

## 2021-08-07 DIAGNOSIS — E1169 Type 2 diabetes mellitus with other specified complication: Secondary | ICD-10-CM | POA: Diagnosis present

## 2021-08-07 DIAGNOSIS — A4151 Sepsis due to Escherichia coli [E. coli]: Secondary | ICD-10-CM | POA: Diagnosis present

## 2021-08-07 DIAGNOSIS — Z8673 Personal history of transient ischemic attack (TIA), and cerebral infarction without residual deficits: Secondary | ICD-10-CM | POA: Diagnosis not present

## 2021-08-07 DIAGNOSIS — Z90722 Acquired absence of ovaries, bilateral: Secondary | ICD-10-CM | POA: Diagnosis not present

## 2021-08-07 DIAGNOSIS — Z7982 Long term (current) use of aspirin: Secondary | ICD-10-CM | POA: Diagnosis not present

## 2021-08-07 DIAGNOSIS — R7881 Bacteremia: Secondary | ICD-10-CM | POA: Diagnosis not present

## 2021-08-07 DIAGNOSIS — G43909 Migraine, unspecified, not intractable, without status migrainosus: Secondary | ICD-10-CM | POA: Diagnosis present

## 2021-08-07 DIAGNOSIS — R000 Tachycardia, unspecified: Secondary | ICD-10-CM

## 2021-08-07 DIAGNOSIS — Z9079 Acquired absence of other genital organ(s): Secondary | ICD-10-CM | POA: Diagnosis not present

## 2021-08-07 DIAGNOSIS — R42 Dizziness and giddiness: Secondary | ICD-10-CM | POA: Diagnosis not present

## 2021-08-07 DIAGNOSIS — B9620 Unspecified Escherichia coli [E. coli] as the cause of diseases classified elsewhere: Secondary | ICD-10-CM | POA: Diagnosis not present

## 2021-08-07 DIAGNOSIS — M797 Fibromyalgia: Secondary | ICD-10-CM | POA: Diagnosis present

## 2021-08-07 DIAGNOSIS — Z90710 Acquired absence of both cervix and uterus: Secondary | ICD-10-CM | POA: Diagnosis not present

## 2021-08-07 DIAGNOSIS — Z882 Allergy status to sulfonamides status: Secondary | ICD-10-CM | POA: Diagnosis not present

## 2021-08-07 DIAGNOSIS — Z87440 Personal history of urinary (tract) infections: Secondary | ICD-10-CM | POA: Diagnosis not present

## 2021-08-07 DIAGNOSIS — Z79891 Long term (current) use of opiate analgesic: Secondary | ICD-10-CM | POA: Diagnosis not present

## 2021-08-07 DIAGNOSIS — Z791 Long term (current) use of non-steroidal anti-inflammatories (NSAID): Secondary | ICD-10-CM | POA: Diagnosis not present

## 2021-08-07 DIAGNOSIS — Z888 Allergy status to other drugs, medicaments and biological substances status: Secondary | ICD-10-CM | POA: Diagnosis not present

## 2021-08-07 LAB — BASIC METABOLIC PANEL
Anion gap: 6 (ref 5–15)
BUN: 12 mg/dL (ref 8–23)
CO2: 24 mmol/L (ref 22–32)
Calcium: 8 mg/dL — ABNORMAL LOW (ref 8.9–10.3)
Chloride: 109 mmol/L (ref 98–111)
Creatinine, Ser: 0.51 mg/dL (ref 0.44–1.00)
GFR, Estimated: >60 mL/min (ref >60)
Glucose, Bld: 156 mg/dL — ABNORMAL HIGH (ref 70–99)
Potassium: 3 mmol/L — ABNORMAL LOW (ref 3.5–5.1)
Sodium: 139 mmol/L (ref 135–145)

## 2021-08-07 LAB — BLOOD CULTURE ID PANEL (REFLEXED) - BCID2

## 2021-08-07 LAB — GLUCOSE, CAPILLARY
Glucose-Capillary: 124 mg/dL — ABNORMAL HIGH (ref 70–99)
Glucose-Capillary: 140 mg/dL — ABNORMAL HIGH (ref 70–99)
Glucose-Capillary: 171 mg/dL — ABNORMAL HIGH (ref 70–99)
Glucose-Capillary: 196 mg/dL — ABNORMAL HIGH (ref 70–99)
Glucose-Capillary: 97 mg/dL (ref 70–99)

## 2021-08-07 LAB — CBC
HCT: 33.1 % — ABNORMAL LOW (ref 36.0–46.0)
Hemoglobin: 10.8 g/dL — ABNORMAL LOW (ref 12.0–15.0)
MCH: 30 pg (ref 26.0–34.0)
MCHC: 32.6 g/dL (ref 30.0–36.0)
MCV: 91.9 fL (ref 80.0–100.0)
Platelets: 140 10*3/uL — ABNORMAL LOW (ref 150–400)
RBC: 3.6 MIL/uL — ABNORMAL LOW (ref 3.87–5.11)
RDW: 13.2 % (ref 11.5–15.5)
WBC: 9.7 10*3/uL (ref 4.0–10.5)
nRBC: 0 % (ref 0.0–0.2)

## 2021-08-07 LAB — MRSA NEXT GEN BY PCR, NASAL: MRSA by PCR Next Gen: NOT DETECTED

## 2021-08-07 MED ORDER — LACTATED RINGERS IV SOLN: INTRAVENOUS | Status: DC

## 2021-08-07 MED ORDER — ONDANSETRON HCL 4 MG PO TABS: 4.0000 mg | ORAL_TABLET | Freq: Four times a day (QID) | ORAL | Status: DC | PRN

## 2021-08-07 MED ORDER — INSULIN GLARGINE-YFGN 100 UNIT/ML ~~LOC~~ SOLN
20.0000 [IU] | Freq: Every day | SUBCUTANEOUS | Status: DC
  Administered 2021-08-07 – 2021-08-09 (×3): 20 [IU] via SUBCUTANEOUS
  Filled 2021-08-07 (×3): qty 0.2

## 2021-08-07 MED ORDER — SODIUM CHLORIDE 0.9 % IV SOLN
2.0000 g | Freq: Three times a day (TID) | INTRAVENOUS | Status: DC
  Administered 2021-08-07: 2 g via INTRAVENOUS
  Filled 2021-08-07: qty 2

## 2021-08-07 MED ORDER — DULOXETINE HCL 30 MG PO CPEP
60.0000 mg | ORAL_CAPSULE | Freq: Every day | ORAL | Status: DC
  Administered 2021-08-07 – 2021-08-09 (×3): 60 mg via ORAL
  Filled 2021-08-07 (×3): qty 2

## 2021-08-07 MED ORDER — POTASSIUM CHLORIDE CRYS ER 20 MEQ PO TBCR
40.0000 meq | EXTENDED_RELEASE_TABLET | ORAL | Status: AC
Start: 2021-08-07 — End: 2021-08-07
  Administered 2021-08-07 (×2): 40 meq via ORAL
  Filled 2021-08-07 (×2): qty 2

## 2021-08-07 MED ORDER — ACETAMINOPHEN 650 MG RE SUPP: 650.0000 mg | Freq: Four times a day (QID) | RECTAL | Status: DC | PRN

## 2021-08-07 MED ORDER — INSULIN ASPART 100 UNIT/ML IJ SOLN
0.0000 [IU] | Freq: Three times a day (TID) | INTRAMUSCULAR | Status: DC
  Administered 2021-08-07 (×2): 2 [IU] via SUBCUTANEOUS
  Administered 2021-08-08: 3 [IU] via SUBCUTANEOUS

## 2021-08-07 MED ORDER — INSULIN ASPART 100 UNIT/ML IJ SOLN: 0.0000 [IU] | Freq: Every day | INTRAMUSCULAR | Status: DC

## 2021-08-07 MED ORDER — LOSARTAN POTASSIUM 50 MG PO TABS
100.0000 mg | ORAL_TABLET | Freq: Every day | ORAL | Status: DC
  Administered 2021-08-07 – 2021-08-09 (×3): 100 mg via ORAL
  Filled 2021-08-07 (×3): qty 2

## 2021-08-07 MED ORDER — SODIUM CHLORIDE 0.9 % IV SOLN
2.0000 g | INTRAVENOUS | Status: DC
  Administered 2021-08-07 – 2021-08-08 (×2): 2 g via INTRAVENOUS
  Filled 2021-08-07 (×3): qty 20

## 2021-08-07 MED ORDER — ENOXAPARIN SODIUM 40 MG/0.4ML IJ SOSY
40.0000 mg | PREFILLED_SYRINGE | INTRAMUSCULAR | Status: DC
  Administered 2021-08-07 – 2021-08-09 (×3): 40 mg via SUBCUTANEOUS
  Filled 2021-08-07 (×3): qty 0.4

## 2021-08-07 MED ORDER — TRAMADOL HCL 50 MG PO TABS
50.0000 mg | ORAL_TABLET | Freq: Four times a day (QID) | ORAL | Status: DC | PRN
  Administered 2021-08-08: 50 mg via ORAL
  Filled 2021-08-07: qty 1

## 2021-08-07 MED ORDER — ACETAMINOPHEN 325 MG PO TABS
650.0000 mg | ORAL_TABLET | Freq: Four times a day (QID) | ORAL | Status: DC | PRN
  Administered 2021-08-07 (×2): 650 mg via ORAL
  Filled 2021-08-07 (×3): qty 2

## 2021-08-07 MED ORDER — TRAZODONE HCL 50 MG PO TABS
50.0000 mg | ORAL_TABLET | ORAL | Status: DC | PRN
  Administered 2021-08-07: 50 mg via ORAL
  Filled 2021-08-07: qty 1

## 2021-08-07 MED ORDER — CHLORHEXIDINE GLUCONATE CLOTH 2 % EX PADS
6.0000 | MEDICATED_PAD | Freq: Every day | CUTANEOUS | Status: DC
  Administered 2021-08-07 – 2021-08-09 (×3): 6 via TOPICAL

## 2021-08-07 MED ORDER — SENNOSIDES-DOCUSATE SODIUM 8.6-50 MG PO TABS: 1.0000 | ORAL_TABLET | Freq: Every evening | ORAL | Status: DC | PRN

## 2021-08-07 MED ORDER — ATORVASTATIN CALCIUM 40 MG PO TABS
40.0000 mg | ORAL_TABLET | Freq: Every day | ORAL | Status: DC
  Administered 2021-08-07 – 2021-08-09 (×3): 40 mg via ORAL
  Filled 2021-08-07 (×3): qty 1

## 2021-08-07 MED ORDER — ASPIRIN 81 MG PO CHEW
81.0000 mg | CHEWABLE_TABLET | Freq: Every day | ORAL | Status: DC
  Administered 2021-08-07 – 2021-08-09 (×3): 81 mg via ORAL
  Filled 2021-08-07 (×3): qty 1

## 2021-08-07 MED ORDER — ONDANSETRON HCL 4 MG/2ML IJ SOLN: 4.0000 mg | Freq: Four times a day (QID) | INTRAMUSCULAR | Status: DC | PRN

## 2021-08-07 NOTE — Progress Notes (Signed)
Patient ID: Christine [name], female   DOB: Jul 27, 1951, 70 y.o.   MRN: [id number] Patient admitted early this morning for fever, chills and fatigue and started on antibiotics empirically.  Patient seen and examined at bedside.  Plan of care discussed with her.  I have reviewed patient's medical records including this morning's H&P, labs, medications and vitals myself.  Continue IV fluids and antibiotics for now.  Follow cultures.  Repeat a.m. labs.  Transfer patient to [place].

## 2021-08-07 NOTE — Progress Notes (Signed)
Admitted from [facility] to room 1224.

## 2021-08-07 NOTE — H&P (Signed)
History and Physical    Christine [name] [id number] DOB: 08/07/1951 DOA: 08/06/2021  PCP: [name], MD   Patient coming from: Home via [facility] ER  Chief Complaint: fever, chills, fatigue  HPI: Christine [name] is a 70 y.o. female with medical history significant for HTN, DMT2, fibromyalgia, hx of colonization of urine, hx of TIA who presents from [facility] ER.  She reported that she had a syncopal episode earlier in the day on August 06, 2021 and had fever and chills and a migraine headache so she went to the emergency room.  She has taken Phenergan and tramadol at home without much improvement in her symptoms.  She was found to have a temperature of 103 degrees with chills.  She reports that the night before she may have passed out while she was making a drink at home.  She states that she does not member passing out she did not fall or hit her head but she spilled ginger ale and does not remember doing it.  She reports she has had intermittent dizziness with no ringing in her ears.  She states that she has been seen by urology in the past and has been told she has colonization of her urine with bacteria but is not necessarily an infection.  She states she has not had any abdominal pain or dysuria.  She does report having urinary hesitancy and her urine became foul-smelling recently.  She reports that she has been having normal bowel movements.  She reports she had some pain underneath her left breast yesterday afternoon but it is resolved now.  This pain is exacerbated by palpation of the area.  She reports no shortness of breath or palpitations.  In the emergency room she had mildly elevated WBC but no clear source of infection was identified.  CT of her chest abdomen pelvis was negative.  Urinalysis was negative for leukocyte esterase and nitrites but she did have WBCs and bacteria on microscopy of the urine.  ED Course: She was found to have tachycardia with a normal lactic acid  level, WBC is less than 12,000 with normal renal function and chest x-ray, CT of the chest abdomen pelvis negative.  Patient was given broad-spectrum antibiotics emergency room and sent for admission to the hospitalist service  Review of Systems:  General: Reports fatigue, fever, chills. Denies weight loss, night sweats. Denies dizziness. Denies change in appetite HENT: Denies head trauma, headache, denies change in hearing, tinnitus.  Denies nasal congestion or bleeding.  Denies sore throat, sores in mouth.  Denies difficulty swallowing Eyes: Denies blurry vision, pain in eye, drainage.  Denies discoloration of eyes. Neck: Denies pain.  Denies swelling.  Denies pain with movement. Cardiovascular: Reports chest pain. Denies palpitations.  Denies edema.  Denies orthopnea Respiratory: Denies shortness of breath, cough.  Denies wheezing.  Denies sputum production Gastrointestinal: Denies abdominal pain, swelling.  Denies nausea, vomiting, diarrhea.  Denies melena.  Denies hematemesis. Musculoskeletal: Denies limitation of movement.  Denies deformity or swelling.  Denies pain.  Denies arthralgias or myalgias. Genitourinary: Reports urinary hesitency. Denies pelvic pain.  Denies urinary frequency.  Denies dysuria.  Skin: Denies rash.  Denies petechiae, purpura, ecchymosis. Neurological: Denies syncope. Denies seizure activity. Denies paresthesia. Denies slurred speech, drooping face.  Denies visual change. Psychiatric: Denies depression, anxiety. Denies hallucinations.  Past Medical History:  Diagnosis Date   Acute bursitis of right shoulder 08/29/2018   Right shoulder injected in August 29, 2018   Allergic rhinitis  Closed displaced fracture of fifth metatarsal bone of left foot 03/16/2017   DEPRESSIVE DISORDER NOT ELSEWHERE CLASSIFIED 03/27/2010   Qualifier: Diagnosis of  By: [name] MD, [name]   [name], NP , [facility] [place]     Diabetes ([place]) 04/02/2016   Diabetes  mellitus    Essential hypertension 03/14/2008   Qualifier: Diagnosis of  By: [name] MD, [name]     Fibromyalgia 08/12/2011   Diagnosed by [name] , Rheumatologist 2010    GERD (gastroesophageal reflux disease) 12/25/2016   Gluteal tendonitis of right buttock 10/19/2017   Injected in October 19, 2017 Injected October 07, 2018   Greater trochanteric bursitis of left hip 06/17/2016   Greater trochanteric bursitis of right hip 11/27/2015   Injected 11/27/2015    History of recurrent UTIs    [name]   HTN (hypertension)    Hx of osteopenia 10/11/2012   [facility] DEXA; ordered by [name] Lowest T score: -2.1 @ lumbar spine @ [facility] 11/01/12; decrease of 8.5% vs 05/2009. There is 9.2% risk over 10 yrs History fracture left elbow @ 4 Fractured left wrist , right elbow and nose post fall July/18/2013. Dr. [name] No FH Osteoporosis No PMH of bisphosphonate therapy (generic Fosamax Rxed but not filled  due to polypharmacy )    Hyperlipidemia    Hyperlipidemia associated with type 2 diabetes mellitus ([place]) 01/18/2007   Qualifier: Diagnosis of  By: [name]  With DM LDL goal = < 100, ideally < 70. Mi in father @ 73; 2 brothers @ 55 & 16     Lumbar radiculopathy 04/11/2008   Qualifier: Diagnosis of  By: [name] MD, Erick Blinks well to epidural 01/12/2017  repeat epidural given May 2018   Migraine headache    quiescent   Nonallopathic lesion of cervical region 07/19/2014   Nonallopathic lesion of lumbosacral region 01/15/2016   Nonallopathic lesion of sacral region 06/27/2018   Nonallopathic lesion-rib cage 09/26/2014   Palpitations 04/15/2011   Rotator cuff impingement syndrome of left shoulder 05/11/2014   Seasonal allergic rhinitis 03/21/2012   Sinusitis, chronic 04/20/2016   Sleep disorder    Dr Dohmier   Subluxation of peroneal tendon of right foot 11/10/2017   TIA (transient ischemic attack)    [place], HX OF 02/02/2008   Qualifier: Diagnosis of  By: Donalee Citrin ADVRS EFF UNS RX MEDICINAL&BIOLOGICAL [place] 02/02/2008   Qualifier: Diagnosis of  By: [name] MD, Posey Pronto AND MYOSITIS 02/02/2008   Qualifier: Diagnosis of  By: [name] MD, [name]     UTI (urinary tract infection) 10/05/2014   Viral upper respiratory tract infection with cough 12/31/2014   Vitamin D deficiency 05/25/2008   Qualifier: Diagnosis of  By: [name] MD, [name]      Past Surgical History:  Procedure Laterality Date   COLONOSCOPY     Dr Carlean [name]; hemorrhoids   CORONARY ANGIOPLASTY  1997   for chest pain- negative    POLYPECTOMY  2002   benign, hyperplastic polyp; rectal bleeding 2008   [place]   BSO for endometriosis   WISDOM TOOTH EXTRACTION      Social History  reports that she has never smoked. She has never used smokeless tobacco. She reports that she does not drink alcohol and does not use drugs.  Allergies  Allergen Reactions   Vioxx [Rofecoxib] Other (See Comments)  Excess BP which caused TIA    Prednisone Other (See Comments)    Mental status changes with high-dose oral agent. Tolerates epidural steroid injections. No associated rash or fever   Macrodantin [Nitrofurantoin Macrocrystal] Hives   Sulfa Antibiotics     Hives   Colesevelam Other (See Comments)    Leg Pain    Family History  Problem Relation Age of Onset   Colon cancer Maternal Aunt    Diabetes Paternal Uncle    Heart attack Father 31   COPD Mother    Lung cancer Brother        smoker   Mental illness Other        niece, committed suicide.   Heart attack Other        brother X 2; @ 58 & 52   Stroke Neg Hx      Prior to Admission medications   Medication Sig Start Date End Date Taking? Authorizing Provider  aspirin 81 MG tablet Take 81 mg by mouth daily.    [provider]  atorvastatin (LIPITOR) 40 MG tablet Take 1 tablet (40 mg total) by mouth daily. 01/27/21   [name], MD  BD PEN  NEEDLE NANO 2ND GEN 32G X 4 MM MISC USE ONCE A DAY AS DIRECTED 07/15/21   Renato Shin, MD  cephALEXin (KEFLEX) 250 MG capsule Take 1 capsule (250 mg total) by mouth daily. 01/27/21   [name], MD  DULoxetine (CYMBALTA) 60 MG capsule Take 60 mg by mouth daily.    [provider]  Insulin Glargine (BASAGLAR KWIKPEN) 100 UNIT/ML Inject 20 Units into the skin every morning. 06/19/21   Renato Shin, MD  Lancets [facility] ULTRASOFT) lancets Check blood sugar once daily. Dx code: 250.00 11/08/13   Hendricks Limes, MD  LORazepam (ATIVAN) 0.5 MG tablet Take 0.5 mg by mouth as needed. For anxiety.    [provider]  losartan (COZAAR) 100 MG tablet Take 1 tablet (100 mg total) by mouth daily. 05/02/21   [name], MD  meclizine (ANTIVERT) 25 MG tablet Take 1 tablet (25 mg total) by mouth daily as needed for dizziness. 04/21/21   [name], MD  naproxen (NAPROSYN) 500 MG tablet Take 1 tablet (500 mg total) by mouth 2 (two) times daily with a meal. 06/13/21   Lyndal Pulley, DO  omeprazole (PRILOSEC) 20 MG capsule TAKE 1 CAPSULE BY MOUTH EVERY DAY 06/13/21   [name], MD  [facility] ULTRA test strip USE 1 STRIP TWICE DAILY DX CODE E11.65 06/06/21   Renato Shin, MD  promethazine (PHENERGAN) 12.5 MG tablet Take 1 tablet (12.5 mg total) by mouth every 8 (eight) hours as needed for nausea or vomiting. 04/21/21   [name], MD  Semaglutide,0.25 or 0.'5MG'$ /DOS, (OZEMPIC, 0.25 OR 0.5 MG/DOSE,) 2 MG/1.5ML SOPN Inject 0.5 mg into the skin once a week. 06/19/21   Renato Shin, MD  tiZANidine (ZANAFLEX) 2 MG tablet Take 1 tablet (2 mg total) by mouth at bedtime. 01/28/21   Lyndal Pulley, DO  traMADol (ULTRAM) 50 MG tablet Take 1 tablet (50 mg total) by mouth every 6 (six) hours as needed. 04/21/21   [name], MD  traZODone (DESYREL) 50 MG tablet TAKE 1/2 TO 1 TABLET BY MOUTH AT BEDTIME AS NEEDED FOR SLEEP Patient taking differently: Take 50  mg by mouth as needed. 07/08/20   [name], MD  Vitamin D, Ergocalciferol, (DRISDOL) 1.25 MG (50000 UNIT) CAPS  capsule TAKE 1 CAPSULE BY MOUTH ONE TIME PER WEEK 06/13/21   Lyndal Pulley, DO    Physical Exam: Vitals:   08/06/21 2300 08/07/21 0008 08/07/21 0100 08/07/21 0200  BP: (!) 141/67 (!) 115/52 (!) 135/56 (!) 133/50  Pulse: (!) 119 (!) 110 (!) 109 98  Resp: '16 12 17 17  '$ Temp:  99.6 F (37.6 C)    TempSrc:  Oral    SpO2: 96% 96% 96% 94%  Weight:  73 kg    Height:        Constitutional: NAD, calm, comfortable Vitals:   08/06/21 2300 08/07/21 0008 08/07/21 0100 08/07/21 0200  BP: (!) 141/67 (!) 115/52 (!) 135/56 (!) 133/50  Pulse: (!) 119 (!) 110 (!) 109 98  Resp: '16 12 17 17  '$ Temp:  99.6 F (37.6 C)    TempSrc:  Oral    SpO2: 96% 96% 96% 94%  Weight:  73 kg    Height:       General: WDWN, Alert and oriented x3.  Eyes: EOMI, PERRL, conjunctivae normal.  Sclera nonicteric HENT:  [place]/AT, external ears normal.  Nares patent without epistasis.  Mucous membranes are moist. Posterior pharynx clear  Neck: Soft, normal range of motion, supple, no masses, no thyromegaly.  Trachea midline Respiratory: clear to auscultation bilaterally, no wheezing, no crackles. Normal respiratory effort. No accessory muscle use.  Cardiovascular: Regular rate and rhythm, has 3/6 systolic murmur. No rubs / gallops. No extremity edema Abdomen: Soft, no tenderness, nondistended, no rebound or guarding. No masses palpated. Bowel sounds normoactive. No CVA tenderness to percussion. Musculoskeletal: FROM. no cyanosis. No joint deformity upper and lower extremities. Normal muscle tone.  Skin: Warm, dry, intact no rashes, lesions, ulcers. No induration Neurologic: CN 2-12 grossly intact. Normal speech. Sensation intact, patella DTR +1 bilaterally. Strength 5/5 in all extremities.   Psychiatric: Normal judgment and insight.  Normal mood.    Labs on Admission: I have personally reviewed following  labs and imaging studies  CBC: Recent Labs  Lab 08/06/21 1820  WBC 11.3*  NEUTROABS 8.8*  HGB 12.6  HCT 37.6  MCV 89.5  PLT [id number]    Basic Metabolic Panel: Recent Labs  Lab 08/06/21 1820  NA 133*  K 3.5  CL 100  CO2 25  GLUCOSE 190*  BUN 19  CREATININE 0.83  CALCIUM 8.9    GFR: Estimated Creatinine Clearance: 62.7 mL/min (by C-G formula based on SCr of 0.83 mg/dL).  Liver Function Tests: Recent Labs  Lab 08/06/21 1820  AST 15  ALT 21  ALKPHOS 60  BILITOT 0.9  PROT 6.7  ALBUMIN 3.5    Urine analysis:    Component Value Date/Time   COLORURINE YELLOW 08/06/2021 2000   APPEARANCEUR CLOUDY (A) 08/06/2021 2000   LABSPEC 1.015 08/06/2021 2000   PHURINE 6.0 08/06/2021 2000   GLUCOSEU NEGATIVE 08/06/2021 2000   HGBUR MODERATE (A) 08/06/2021 2000   HGBUR large 04/11/2008 1319   BILIRUBINUR NEGATIVE 08/06/2021 2000   BILIRUBINUR Negative 01/27/2021 1134   KETONESUR 15 (A) 08/06/2021 2000   PROTEINUR 30 (A) 08/06/2021 2000   UROBILINOGEN 0.2 01/27/2021 1134   UROBILINOGEN 0.2 10/05/2014 1508   NITRITE NEGATIVE 08/06/2021 2000   LEUKOCYTESUR NEGATIVE 08/06/2021 2000    Radiological Exams on Admission: CT Chest W Contrast  Result Date: 08/06/2021 CLINICAL DATA:  Abdominal pain and fever. Syncopal episode yesterday. Fever and chills. Pain to the left breast area. EXAM: CT CHEST, ABDOMEN, AND PELVIS WITH CONTRAST TECHNIQUE: Multidetector CT  imaging of the chest, abdomen and pelvis was performed following the standard protocol during bolus administration of intravenous contrast. CONTRAST:  99m OMNIPAQUE IOHEXOL 300 MG/ML  SOLN COMPARISON:  CT abdomen and pelvis 04/09/2008. Cardiac CT chest 12/19/2019 FINDINGS: CT CHEST FINDINGS Cardiovascular: Normal heart size. No pericardial effusion. Coronary artery and aortic calcification. No aortic aneurysm. Great vessel origins are patent. Mediastinum/Nodes: Thyroid gland is unremarkable. Esophagus is decompressed. No  significant lymphadenopathy. Lungs/Pleura: Linear atelectasis in the lung bases. No airspace disease or consolidation. No pleural effusions. No pneumothorax. Musculoskeletal: No acute bony abnormalities. CT ABDOMEN PELVIS FINDINGS Hepatobiliary: No focal liver abnormality is seen. No gallstones, gallbladder wall thickening, or biliary dilatation. Pancreas: Unremarkable. No pancreatic ductal dilatation or surrounding inflammatory changes. Spleen: Normal in size without focal abnormality. Adrenals/Urinary Tract: Adrenal glands are unremarkable. Kidneys are normal, without renal calculi, focal lesion, or hydronephrosis. Bladder is unremarkable. Stomach/Bowel: Stomach, small bowel, and colon are not abnormally distended. No wall thickening or inflammatory changes are appreciated. Appendix is normal. Vascular/Lymphatic: Calcification of the aorta. No aneurysm. No significant lymphadenopathy. Reproductive: Status post hysterectomy. No adnexal masses. Other: No free air or free fluid. Abdominal wall musculature appears intact. Musculoskeletal: No acute bony abnormalities. IMPRESSION: 1. No acute abnormalities demonstrated in the chest, abdomen, or pelvis. 2. Aortic atherosclerosis. 3. Linear atelectasis in the lung bases. Electronically Signed   By: WLucienne CapersM.D.   On: 08/06/2021 21:11   CT Abdomen Pelvis W Contrast  Result Date: 08/06/2021 CLINICAL DATA:  Abdominal pain and fever. Syncopal episode yesterday. Fever and chills. Pain to the left breast area. EXAM: CT CHEST, ABDOMEN, AND PELVIS WITH CONTRAST TECHNIQUE: Multidetector CT imaging of the chest, abdomen and pelvis was performed following the standard protocol during bolus administration of intravenous contrast. CONTRAST:  733mOMNIPAQUE IOHEXOL 300 MG/ML  SOLN COMPARISON:  CT abdomen and pelvis 04/09/2008. Cardiac CT chest 12/19/2019 FINDINGS: CT CHEST FINDINGS Cardiovascular: Normal heart size. No pericardial effusion. Coronary artery and aortic  calcification. No aortic aneurysm. Great vessel origins are patent. Mediastinum/Nodes: Thyroid gland is unremarkable. Esophagus is decompressed. No significant lymphadenopathy. Lungs/Pleura: Linear atelectasis in the lung bases. No airspace disease or consolidation. No pleural effusions. No pneumothorax. Musculoskeletal: No acute bony abnormalities. CT ABDOMEN PELVIS FINDINGS Hepatobiliary: No focal liver abnormality is seen. No gallstones, gallbladder wall thickening, or biliary dilatation. Pancreas: Unremarkable. No pancreatic ductal dilatation or surrounding inflammatory changes. Spleen: Normal in size without focal abnormality. Adrenals/Urinary Tract: Adrenal glands are unremarkable. Kidneys are normal, without renal calculi, focal lesion, or hydronephrosis. Bladder is unremarkable. Stomach/Bowel: Stomach, small bowel, and colon are not abnormally distended. No wall thickening or inflammatory changes are appreciated. Appendix is normal. Vascular/Lymphatic: Calcification of the aorta. No aneurysm. No significant lymphadenopathy. Reproductive: Status post hysterectomy. No adnexal masses. Other: No free air or free fluid. Abdominal wall musculature appears intact. Musculoskeletal: No acute bony abnormalities. IMPRESSION: 1. No acute abnormalities demonstrated in the chest, abdomen, or pelvis. 2. Aortic atherosclerosis. 3. Linear atelectasis in the lung bases. Electronically Signed   By: WiLucienne Capers.D.   On: 08/06/2021 21:11   DG Chest Port 1 View  Result Date: 08/06/2021 CLINICAL DATA:  Syncope EXAM: PORTABLE CHEST 1 VIEW COMPARISON:  07/09/2020 FINDINGS: The heart size and mediastinal contours are within normal limits. Both lungs are clear. The visualized skeletal structures are unremarkable. IMPRESSION: No active disease. Electronically Signed   By: KeRolm Baptise.D.   On: 08/06/2021 18:46    EKG: Independently reviewed.  EKG shows normal sinus  rhythm with no acute ST elevation or depression.  QTc  432  Assessment/Plan Principal Problem:   Sepsis  Ms. Peron was sent from [facility] ER for sepsis. Sepsis was based on fever and tachycardia. She has no definitive source of infection and has no end organ dysfunction. She was given broad spectrum antibiotics with cefepime Flagyl and vancomycin in the emergency room.  Reviewing her case with the pharmacist there is no clear indication for this regimen of antibiotic.  Urinalysis is negative for leukocytes and nitrites, she does have bacteria and WBCs in her urine coloscopy patient states that she has history of urine colonization that she has seen urology for in the past.  She did have fever earlier but has not had any dysuria.  We will continue cefepime for 2 doses while cultures are pending but will stop Flagyl and vancomycin.  MRSA swab is negative.  Blood cultures obtained in the emergency room and will be monitored.  Active Problems:   Bacteria in urine Urine cultures been obtained and will monitor.    Urinary hesitancy Patient with decreased urinary output and hesitancy but no dysuria.  She denies any lower abdominal pain.    Elevated troponin Initial troponin was negative at 5 and repeat troponin came back 19.  We will cycle serial troponins overnight. EKG shows no ischemic changes    Diabetes mellitus type 2 in nonobese Continue basal insulin.  Monitor blood sugars with meals and bedtime and provide corrective insulin as needed for glycemic control. She had a hemoglobin A1c on June 19, 2021 which was 6.1.  Therefore HgbA1c does not he be rechecked now    Tachycardia Mild tachycardia.  Hemodynamically stable    Essential hypertension Continue Cozaar. Monitor BP    Fibromyalgia Continue cymbalta    DVT prophylaxis: Lovenox for DVT prophylaxis  Code Status:   Full Code  Family Communication:  Diagnosis and plan discussed with patient.  She verbalized understanding agrees with plan.  Further recommendations to follow as clinically  indicated Disposition Plan:   Patient is from:  Home  Anticipated DC to:  Home  Anticipated DC date:  Anticipate 2 midnight stay in the hospital  Admission status:  Inpatient.    Yevonne Aline Symphony Demuro MD [facility]  How to contact the [facility] Attending or Consulting provider [place] or covering provider during after hours [place], for this patient?   Check the care team in [facility] and look for a) attending/consulting [facility] provider listed and b) the [facility] team listed Log into [email or web address] and use [place]'s universal password to access. If you do not have the password, please contact the hospital operator. Locate the [facility] provider you are looking for under [facility] and page to a number that you can be directly reached. If you still have difficulty reaching the provider, please page the [facility] (Director on Call) for the Hospitalists listed on amion for assistance.  08/07/2021, 2:56 AM

## 2021-08-07 NOTE — TOC Initial Note (Signed)
Transition of Care [facility]) - Initial/Assessment Note    Patient Details  Name: Christine [name] MRN: [id number] Date of Birth: 07-13-1951  Transition of Care [facility]) CM/SW Contact:    Leeroy Cha, RN Phone Number: 08/07/2021, 7:32 AM  Clinical Narrative:                  70 y.o. female with medical history significant for HTN, DMT2, fibromyalgia, hx of colonization of urine, hx of TIA who presents from [facility] ER.  She reported that she had a syncopal episode earlier in the day on August 06, 2021 and had fever and chills and a migraine headache so she went to the emergency room.  She has taken Phenergan and tramadol at home without much improvement in her symptoms.  She was found to have a temperature of 103 degrees with chills.  She reports that the night before she may have passed out while she was making a drink at home.  She states that she does not member passing out she did not fall or hit her head but she spilled ginger ale and does not remember doing it.  She reports she has had intermittent dizziness with no ringing in her ears.  She states that she has been seen by urology in the past and has been told she has colonization of her urine with bacteria but is not necessarily an infection.  She states she has not had any abdominal pain or dysuria.  She does report having urinary hesitancy and her urine became foul-smelling recently.  She reports that she has been having normal bowel movements.  She reports she had some pain underneath her left breast yesterday afternoon but it is resolved now.  This pain is exacerbated by palpation of the area.  She reports no shortness of breath or palpitations.  In the emergency room she had mildly elevated WBC but no clear source of infection was identified.  CT of her chest abdomen pelvis was negative.  Urinalysis was negative for leukocyte esterase and nitrites but she did have WBCs and bacteria on microscopy of the urine.   ED Course: She was found to  have tachycardia with a normal lactic acid level, WBC is less than 12,000 with normal renal function and chest x-ray, CT of the chest abdomen pelvis negative.  Patient was given broad-spectrum antibiotics emergency room and sent for admission to the hospitalist service [place]: following for progression and hhc needs if any. Expected Discharge Plan: Home/Self Care Barriers to Discharge: Continued Medical Work up   Patient Goals and CMS Choice        Expected Discharge Plan and Services Expected Discharge Plan: Home/Self Care   Discharge Planning Services: CM Consult   Living arrangements for the past 2 months: Single Family Home                                      Prior Living Arrangements/Services Living arrangements for the past 2 months: Single Family Home Lives with:: Spouse Patient language and need for interpreter reviewed:: Yes Do you feel safe going back to the place where you live?: Yes            Criminal Activity/Legal Involvement Pertinent to Current Situation/Hospitalization: No - Comment as needed  Activities of Daily Living      Permission Sought/Granted  Emotional Assessment Appearance:: Appears stated age     Orientation: : Oriented to Self, Oriented to Place, Oriented to  Time, Oriented to Situation Alcohol / Substance Use: Not Applicable Psych Involvement: No (comment)  Admission diagnosis:  Lower urinary tract infectious disease [N39.0] Sepsis ([place]) [A41.9] Sepsis without acute organ dysfunction, due to unspecified organism [facility]) [A41.9] Patient Active Problem List   Diagnosis Date Noted   Bacteria in urine 08/07/2021   Urinary hesitancy 08/07/2021   Elevated troponin 08/07/2021   Diabetes mellitus type 2 in nonobese ([place]) 08/07/2021   Tachycardia 08/07/2021   Sepsis ([place]) 08/06/2021   Dizziness 04/22/2021   Arthritis of right hip 02/12/2021   Leg swelling 07/12/2020   Cervical disc disorder with  radiculopathy of cervical region 01/25/2020   Coronary atherosclerosis of native coronary artery 01/25/2020   Nonallopathic lesion of sacral region 06/27/2018   Urinary problem 03/08/2018   Subluxation of peroneal tendon of right foot 11/10/2017   Lumbar radiculopathy, right 06/23/2017   Closed displaced fracture of fifth metatarsal bone of left foot 03/16/2017   GERD (gastroesophageal reflux disease) 12/25/2016   Greater trochanteric bursitis, right 06/17/2016   Migraine 05/06/2016   Diabetes ([place]) 04/02/2016   Nonallopathic lesion of lumbosacral region 01/15/2016   Routine general medical examination at a health care facility 11/25/2015   Nonallopathic lesion of thoracic region 09/26/2014   Nonallopathic lesion-rib cage 09/26/2014   Nonallopathic lesion of cervical region 07/19/2014   Hx of osteopenia 10/11/2012   Seasonal allergic rhinitis 03/21/2012   Fibromyalgia 08/12/2011   Palpitations 04/15/2011   DEPRESSIVE DISORDER NOT ELSEWHERE CLASSIFIED 03/27/2010   Vitamin D deficiency 05/25/2008   Lumbar radiculopathy 04/11/2008   Essential hypertension 03/14/2008   History of cardiovascular disorder 02/02/2008   Hyperlipidemia associated with type 2 diabetes mellitus ([place]) 01/18/2007   PCP:  [name], MD Pharmacy:   [id number] [place] [place] [address]Phone: [phone]: [phone] [place] [address]Phone: [phone]: [phone]    Social Determinants of Health (SDOH) Interventions    Readmission Risk Interventions No flowsheet data found.

## 2021-08-07 NOTE — Progress Notes (Signed)
PHARMACY - PHYSICIAN COMMUNICATION CRITICAL VALUE ALERT - BLOOD CULTURE IDENTIFICATION (BCID)  Christine [name] is an 70 y.o. female who presented to [facility] on 08/06/2021 with a chief complaint of fever, chills  Assessment:  4 of 4 bottles from blood cultures growing [place]   Name of physician (or Provider) ContactedStarla Link  Current antibiotics: Cefepime/vanc/flagyl   Changes to prescribed antibiotics recommended:  Rocephin 2g IV q24  Results for orders placed or performed during the hospital encounter of 08/06/21  Blood Culture ID Panel (Reflexed) (Collected: 08/06/2021  7:30 PM)  Result Value Ref Range   Enterococcus faecalis NOT DETECTED NOT DETECTED   Enterococcus Faecium NOT DETECTED NOT DETECTED   Listeria monocytogenes NOT DETECTED NOT DETECTED   Staphylococcus species NOT DETECTED NOT DETECTED   Staphylococcus aureus (BCID) NOT DETECTED NOT DETECTED   Staphylococcus epidermidis NOT DETECTED NOT DETECTED   Staphylococcus lugdunensis NOT DETECTED NOT DETECTED   Streptococcus species NOT DETECTED NOT DETECTED   Streptococcus agalactiae NOT DETECTED NOT DETECTED   Streptococcus pneumoniae NOT DETECTED NOT DETECTED   Streptococcus pyogenes NOT DETECTED NOT DETECTED   A.calcoaceticus-baumannii NOT DETECTED NOT DETECTED   Bacteroides fragilis NOT DETECTED NOT DETECTED   Enterobacterales DETECTED (A) NOT DETECTED   Enterobacter cloacae complex NOT DETECTED NOT DETECTED   Escherichia coli DETECTED (A) NOT DETECTED   Klebsiella aerogenes NOT DETECTED NOT DETECTED   Klebsiella oxytoca NOT DETECTED NOT DETECTED   Klebsiella pneumoniae NOT DETECTED NOT DETECTED   Proteus species NOT DETECTED NOT DETECTED   Salmonella species NOT DETECTED NOT DETECTED   Serratia marcescens NOT DETECTED NOT DETECTED   Haemophilus influenzae NOT DETECTED NOT DETECTED   Neisseria meningitidis NOT DETECTED NOT DETECTED   Pseudomonas aeruginosa NOT DETECTED NOT DETECTED   Stenotrophomonas  maltophilia NOT DETECTED NOT DETECTED   Candida albicans NOT DETECTED NOT DETECTED   Candida auris NOT DETECTED NOT DETECTED   Candida glabrata NOT DETECTED NOT DETECTED   Candida krusei NOT DETECTED NOT DETECTED   Candida parapsilosis NOT DETECTED NOT DETECTED   Candida tropicalis NOT DETECTED NOT DETECTED   Cryptococcus neoformans/gattii NOT DETECTED NOT DETECTED   CTX-M ESBL NOT DETECTED NOT DETECTED   Carbapenem resistance IMP NOT DETECTED NOT DETECTED   Carbapenem resistance KPC NOT DETECTED NOT DETECTED   Carbapenem resistance NDM NOT DETECTED NOT DETECTED   Carbapenem resist OXA 48 LIKE NOT DETECTED NOT DETECTED   Carbapenem resistance VIM NOT DETECTED NOT DETECTED    Kara Mead 08/07/2021  12:01 PM

## 2021-08-08 ENCOUNTER — Encounter: Payer: Self-pay | Admitting: Internal Medicine

## 2021-08-08 DIAGNOSIS — R7881 Bacteremia: Secondary | ICD-10-CM

## 2021-08-08 DIAGNOSIS — R778 Other specified abnormalities of plasma proteins: Secondary | ICD-10-CM

## 2021-08-08 DIAGNOSIS — N390 Urinary tract infection, site not specified: Secondary | ICD-10-CM

## 2021-08-08 DIAGNOSIS — I10 Essential (primary) hypertension: Secondary | ICD-10-CM

## 2021-08-08 DIAGNOSIS — B9620 Unspecified Escherichia coli [E. coli] as the cause of diseases classified elsewhere: Secondary | ICD-10-CM

## 2021-08-08 LAB — CBC WITH DIFFERENTIAL/PLATELET
Abs Immature Granulocytes: 0.04 10*3/uL (ref 0.00–0.07)
Basophils Absolute: 0 10*3/uL (ref 0.0–0.1)
Basophils Relative: 0 %
Eosinophils Absolute: 0.1 10*3/uL (ref 0.0–0.5)
Eosinophils Relative: 1 %
HCT: 32.6 % — ABNORMAL LOW (ref 36.0–46.0)
Hemoglobin: 10.5 g/dL — ABNORMAL LOW (ref 12.0–15.0)
Immature Granulocytes: 1 %
Lymphocytes Relative: 14 %
Lymphs Abs: 1 10*3/uL (ref 0.7–4.0)
MCH: 29.7 pg (ref 26.0–34.0)
MCHC: 32.2 g/dL (ref 30.0–36.0)
MCV: 92.4 fL (ref 80.0–100.0)
Monocytes Absolute: 1 10*3/uL (ref 0.1–1.0)
Monocytes Relative: 14 %
Neutro Abs: 5.2 10*3/uL (ref 1.7–7.7)
Neutrophils Relative %: 70 %
Platelets: 134 10*3/uL — ABNORMAL LOW (ref 150–400)
RBC: 3.53 MIL/uL — ABNORMAL LOW (ref 3.87–5.11)
RDW: 13.1 % (ref 11.5–15.5)
WBC: 7.3 10*3/uL (ref 4.0–10.5)
nRBC: 0 % (ref 0.0–0.2)

## 2021-08-08 LAB — GLUCOSE, CAPILLARY
Glucose-Capillary: 116 mg/dL — ABNORMAL HIGH (ref 70–99)
Glucose-Capillary: 158 mg/dL — ABNORMAL HIGH (ref 70–99)
Glucose-Capillary: 114 mg/dL — ABNORMAL HIGH (ref 70–99)
Glucose-Capillary: 187 mg/dL — ABNORMAL HIGH (ref 70–99)

## 2021-08-08 LAB — COMPREHENSIVE METABOLIC PANEL
ALT: 20 U/L (ref 0–44)
AST: 14 U/L — ABNORMAL LOW (ref 15–41)
Albumin: 2.8 g/dL — ABNORMAL LOW (ref 3.5–5.0)
Alkaline Phosphatase: 55 U/L (ref 38–126)
Anion gap: 9 (ref 5–15)
BUN: 8 mg/dL (ref 8–23)
CO2: 21 mmol/L — ABNORMAL LOW (ref 22–32)
Calcium: 8.2 mg/dL — ABNORMAL LOW (ref 8.9–10.3)
Chloride: 107 mmol/L (ref 98–111)
Creatinine, Ser: 0.48 mg/dL (ref 0.44–1.00)
GFR, Estimated: >60 mL/min (ref >60)
Glucose, Bld: 134 mg/dL — ABNORMAL HIGH (ref 70–99)
Potassium: 2.9 mmol/L — ABNORMAL LOW (ref 3.5–5.1)
Sodium: 137 mmol/L (ref 135–145)
Total Bilirubin: 0.8 mg/dL (ref 0.3–1.2)
Total Protein: 5.6 g/dL — ABNORMAL LOW (ref 6.5–8.1)

## 2021-08-08 LAB — MAGNESIUM: Magnesium: 1.7 mg/dL (ref 1.7–2.4)

## 2021-08-08 MED ORDER — POTASSIUM CHLORIDE CRYS ER 20 MEQ PO TBCR
40.0000 meq | EXTENDED_RELEASE_TABLET | ORAL | Status: AC
Start: 2021-08-08 — End: 2021-08-08
  Administered 2021-08-08 (×2): 40 meq via ORAL
  Filled 2021-08-08 (×2): qty 2

## 2021-08-08 MED ORDER — SODIUM CHLORIDE 0.9 % IV BOLUS
500.0000 mL | Freq: Once | INTRAVENOUS | Status: DC
Start: 2021-08-08 — End: 2021-08-08

## 2021-08-08 MED ORDER — PANTOPRAZOLE SODIUM 40 MG PO TBEC
40.0000 mg | DELAYED_RELEASE_TABLET | Freq: Every day | ORAL | Status: DC
  Administered 2021-08-08 – 2021-08-09 (×2): 40 mg via ORAL
  Filled 2021-08-08 (×2): qty 1

## 2021-08-08 MED ORDER — TAMSULOSIN HCL 0.4 MG PO CAPS
0.4000 mg | ORAL_CAPSULE | Freq: Every day | ORAL | Status: DC
  Administered 2021-08-08 – 2021-08-09 (×2): 0.4 mg via ORAL
  Filled 2021-08-08 (×2): qty 1

## 2021-08-08 NOTE — Progress Notes (Signed)
Patient ID: Christine [name], female   DOB: [birth year], 70 y.o.   MRN: [id number]  PROGRESS NOTE    Christine [name]  [id number] DOB: 05/31/1951 DOA: 08/06/2021 PCP: [name], MD   Brief Narrative:  70 y.o. female with medical history significant for HTN, DMT2, fibromyalgia, hx of colonization of urine, hx of TIA presented with fever, chills and fatigue.  On presentation, she was tachycardic with WBC of 12,000 with normal renal function and chest x-ray.  CT of the chest abdomen and pelvis were negative.  UA was suspicious for UTI.  She was started on broad-spectrum antibiotics and IV fluids.  Assessment & Plan:   Sepsis: Present on admission E. coli bacteremia and possible E. coli UTI: Present on admission Leukocytosis: Resolved -Patient presents with fever and tachycardia with possible UTI and now has bacteremia. -Antibiotics have been added to IV Rocephin.  Currently still spiking temperatures. -Follow sensitivities of E. coli in the blood -Influenza and COVID-19 test negative so far  Elevated troponin -Possibly from demand ischemia.  No chest pain.  No ischemic changes on EKG.  No need for further intervention  Diabetes mellitus type 2 -Continue long-acting insulin along with CBGs with SSI  Hypertension -Monitor blood pressure.  Continue losartan.  Fibromyalgia-continue Cymbalta  Hyperlipidemia-continue statin    DVT prophylaxis: Lovenox Code Status: Full Family Communication: Husband at bedside Disposition Plan: Status is: Inpatient  Remains inpatient appropriate because:Inpatient level of care appropriate due to severity of illness  Dispo: The patient is from: Home              Anticipated d/c is to: Home              Patient currently is not medically stable to d/c.   Difficult to place patient No   Consultants: None  Procedures: None  Antimicrobials:  Anti-infectives (From admission, onward)    Start     Dose/Rate Route  Frequency Ordered Stop   08/07/21 1400  cefTRIAXone (ROCEPHIN) 2 g in sodium chloride 0.9 % 100 mL IVPB        2 g 200 mL/hr over 30 Minutes Intravenous Every 24 hours 08/07/21 1209     08/07/21 1100  vancomycin (VANCOREADY) IVPB 1250 mg/250 mL  Status:  Discontinued        1,250 mg 166.7 mL/hr over 90 Minutes Intravenous Every 24 hours 08/06/21 2211 08/07/21 0343   08/07/21 0400  ceFEPIme (MAXIPIME) 2 g in sodium chloride 0.9 % 100 mL IVPB  Status:  Discontinued        2 g 200 mL/hr over 30 Minutes Intravenous Every 8 hours 08/06/21 2211 08/07/21 0310   08/07/21 0400  ceFEPIme (MAXIPIME) 2 g in sodium chloride 0.9 % 100 mL IVPB  Status:  Discontinued        2 g 200 mL/hr over 30 Minutes Intravenous Every 8 hours 08/07/21 0255 08/07/21 1208   08/06/21 2000  ceFEPIme (MAXIPIME) 2 g in sodium chloride 0.9 % 100 mL IVPB        2 g 200 mL/hr over 30 Minutes Intravenous  Once 08/06/21 1953 08/06/21 2133   08/06/21 2000  metroNIDAZOLE (FLAGYL) IVPB 500 mg        500 mg 100 mL/hr over 60 Minutes Intravenous  Once 08/06/21 1953 08/06/21 2301   08/06/21 2000  vancomycin (VANCOCIN) IVPB 1000 mg/200 mL premix        1,000 mg 200 mL/hr over 60 Minutes Intravenous  Once 08/06/21 1953 08/07/21 0012  Subjective: Patient seen and examined at bedside.  Complains of intermittent fever.  Headache has improved.  Still feels weak.  No current nausea, worsening shortness of breath, chest pain reported.   Objective: Vitals:   08/08/21 0415 08/08/21 0421 08/08/21 0500 08/08/21 0600  BP: (!) 157/62  (!) 164/59 (!) 165/52  Pulse: 97  89 95  Resp: '17  16 17  '$ Temp:      TempSrc:      SpO2: 97%  95% 97%  Weight:  73.2 kg    Height:        Intake/Output Summary (Last 24 hours) at 08/08/2021 0722 Last data filed at 08/08/2021 0416 Gross per 24 hour  Intake 2811.27 ml  Output 2950 ml  Net -138.73 ml   Filed Weights   08/06/21 1735 08/07/21 0008 08/08/21 0421  Weight: 71.2 kg 73 kg 73.2 kg     Examination:  General exam: Appears calm and comfortable.  Currently on room air. Respiratory system: Bilateral decreased breath sounds at bases with intermittent tachypnea Cardiovascular system: S1 & S2 heard, intermittently tachycardic Gastrointestinal system: Abdomen is nondistended, soft and nontender. Normal bowel sounds heard. Extremities: No cyanosis, clubbing similar trace lower extremity edema present  Central nervous system: Alert and oriented. No focal neurological deficits. Moving extremities Skin: No rashes, lesions or ulcers Psychiatry: Judgement and insight appear normal. Mood & affect appropriate.     Data Reviewed: I have personally reviewed following labs and imaging studies  CBC: Recent Labs  Lab 08/06/21 1820 08/07/21 0527 08/08/21 0233  WBC 11.3* 9.7 7.3  NEUTROABS 8.8*  --  5.2  HGB 12.6 10.8* 10.5*  HCT 37.6 33.1* 32.6*  MCV 89.5 91.9 92.4  PLT 184 140* [id number]*   Basic Metabolic Panel: Recent Labs  Lab 08/06/21 1820 08/07/21 0527 08/08/21 0233  NA 133* 139 137  K 3.5 3.0* 2.9*  CL 100 109 107  CO2 25 24 21*  GLUCOSE 190* 156* 134*  BUN '19 12 8  '$ CREATININE 0.83 0.51 0.48  CALCIUM 8.9 8.0* 8.2*  MG  --   --  1.7   GFR: Estimated Creatinine Clearance: 65.1 mL/min (by C-G formula based on SCr of 0.48 mg/dL). Liver Function Tests: Recent Labs  Lab 08/06/21 1820 08/08/21 0233  AST 15 14*  ALT 21 20  ALKPHOS 60 55  BILITOT 0.9 0.8  PROT 6.7 5.6*  ALBUMIN 3.5 2.8*   Recent Labs  Lab 08/06/21 1820  LIPASE 24   No results for input(s): AMMONIA in the last 168 hours. Coagulation Profile: No results for input(s): INR, PROTIME in the last 168 hours. Cardiac Enzymes: No results for input(s): CKTOTAL, CKMB, CKMBINDEX, TROPONINI in the last 168 hours. BNP (last 3 results) No results for input(s): PROBNP in the last 8760 hours. HbA1C: No results for input(s): HGBA1C in the last 72 hours. CBG: Recent Labs  Lab 08/07/21 0006  08/07/21 0834 08/07/21 1142 08/07/21 1545 08/07/21 2137  GLUCAP 196* 97 140* 124* 171*   Lipid Profile: No results for input(s): CHOL, HDL, LDLCALC, TRIG, CHOLHDL, LDLDIRECT in the last 72 hours. Thyroid Function Tests: No results for input(s): TSH, T4TOTAL, FREET4, T3FREE, THYROIDAB in the last 72 hours. Anemia Panel: No results for input(s): VITAMINB12, FOLATE, FERRITIN, TIBC, IRON, RETICCTPCT in the last 72 hours. Sepsis Labs: Recent Labs  Lab 08/06/21 1820 08/06/21 2130  LATICACIDVEN 1.2 1.4    Recent Results (from the past 240 hour(s))  Resp Panel by RT-PCR (Flu A&B, Covid) Nasopharyngeal Swab  Status: None   Collection Time: 08/06/21  6:41 PM   Specimen: Nasopharyngeal Swab; Nasopharyngeal(NP) swabs in vial transport medium  Result Value Ref Range Status   SARS Coronavirus 2 by RT PCR NEGATIVE NEGATIVE Final    Comment: (NOTE) SARS-CoV-2 target nucleic acids are NOT DETECTED.  The SARS-CoV-2 RNA is generally detectable in upper respiratory specimens during the acute phase of infection. The lowest concentration of SARS-CoV-2 viral copies this assay can detect is 138 copies/mL. A negative result does not preclude SARS-Cov-2 infection and should not be used as the sole basis for treatment or other patient management decisions. A negative result may occur with  improper specimen collection/handling, submission of specimen other than nasopharyngeal swab, presence of viral mutation(s) within the areas targeted by this assay, and inadequate number of viral copies(<138 copies/mL). A negative result must be combined with clinical observations, patient history, and epidemiological information. The expected result is Negative.  Fact Sheet for Patients:  EntrepreneurPulse.com.au  Fact Sheet for Healthcare Providers:  IncredibleEmployment.be  This test is no t yet approved or cleared by the [place] FDA and  has been authorized for  detection and/or diagnosis of SARS-CoV-2 by FDA under an Emergency Use Authorization (EUA). This EUA will remain  in effect (meaning this test can be used) for the duration of the COVID-19 declaration under Section 564(b)(1) of the Act, 21 U.S.C.section 360bbb-3(b)(1), unless the authorization is terminated  or revoked sooner.       Influenza A by PCR NEGATIVE NEGATIVE Final   Influenza B by PCR NEGATIVE NEGATIVE Final    Comment: (NOTE) The Xpert Xpress SARS-CoV-2/FLU/RSV plus assay is intended as an aid in the diagnosis of influenza from Nasopharyngeal swab specimens and should not be used as a sole basis for treatment. Nasal washings and aspirates are unacceptable for Xpert Xpress SARS-CoV-2/FLU/RSV testing.  Fact Sheet for Patients: EntrepreneurPulse.com.au  Fact Sheet for Healthcare Providers: IncredibleEmployment.be  This test is not yet approved or cleared by the [place] FDA and has been authorized for detection and/or diagnosis of SARS-CoV-2 by FDA under an Emergency Use Authorization (EUA). This EUA will remain in effect (meaning this test can be used) for the duration of the COVID-19 declaration under Section 564(b)(1) of the Act, 21 U.S.C. section 360bbb-3(b)(1), unless the authorization is terminated or revoked.  Performed at [facility], [address] [place], [place] [address]   Culture, blood (routine x 2)     Status: None (Preliminary result)   Collection Time: 08/06/21  7:30 PM   Specimen: BLOOD  Result Value Ref Range Status   Specimen Description   Final    BLOOD RIGHT ANTECUBITAL Performed at [facility], [address] [place], [place] [address]    Special Requests   Final    BOTTLES DRAWN AEROBIC AND ANAEROBIC Blood Culture adequate volume Performed at [facility], [address] [place], [place] [address]    Culture  Setup Time   Final    GRAM NEGATIVE RODS IN BOTH  AEROBIC AND ANAEROBIC BOTTLES CRITICAL RESULT CALLED TO, READ BACK BY AND VERIFIED WITH: TERRRY GREEN PHARMD 08/07/21 '@1143'$  BY JW Performed at [facility], [place] [address], [place], [place] [address]    Culture GRAM NEGATIVE RODS  Final   Report Status PENDING  Incomplete  Blood Culture ID Panel (Reflexed)     Status: Abnormal   Collection Time: 08/06/21  7:30 PM  Result Value Ref Range  Status   Enterococcus faecalis NOT DETECTED NOT DETECTED Final   Enterococcus Faecium NOT DETECTED NOT DETECTED Final   Listeria monocytogenes NOT DETECTED NOT DETECTED Final   Staphylococcus species NOT DETECTED NOT DETECTED Final   Staphylococcus aureus (BCID) NOT DETECTED NOT DETECTED Final   Staphylococcus epidermidis NOT DETECTED NOT DETECTED Final   Staphylococcus lugdunensis NOT DETECTED NOT DETECTED Final   Streptococcus species NOT DETECTED NOT DETECTED Final   Streptococcus agalactiae NOT DETECTED NOT DETECTED Final   Streptococcus pneumoniae NOT DETECTED NOT DETECTED Final   Streptococcus pyogenes NOT DETECTED NOT DETECTED Final   A.calcoaceticus-baumannii NOT DETECTED NOT DETECTED Final   Bacteroides fragilis NOT DETECTED NOT DETECTED Final   Enterobacterales DETECTED (A) NOT DETECTED Final    Comment: Enterobacterales represent a large order of gram negative bacteria, not a single organism. TERRY GREEN PHARMD 08/07/2021 '@1139'$  BY JW    Enterobacter cloacae complex NOT DETECTED NOT DETECTED Final   Escherichia coli DETECTED (A) NOT DETECTED Final    Comment: TERRY GREEN PHARMD 08/07/2021 '@1139'$  BY JW   Klebsiella aerogenes NOT DETECTED NOT DETECTED Final   Klebsiella oxytoca NOT DETECTED NOT DETECTED Final   Klebsiella pneumoniae NOT DETECTED NOT DETECTED Final   Proteus species NOT DETECTED NOT DETECTED Final   Salmonella species NOT DETECTED NOT DETECTED Final   Serratia marcescens NOT DETECTED NOT DETECTED Final   Haemophilus influenzae NOT DETECTED NOT DETECTED Final   Neisseria  meningitidis NOT DETECTED NOT DETECTED Final   Pseudomonas aeruginosa NOT DETECTED NOT DETECTED Final   Stenotrophomonas maltophilia NOT DETECTED NOT DETECTED Final   Candida albicans NOT DETECTED NOT DETECTED Final   Candida auris NOT DETECTED NOT DETECTED Final   Candida glabrata NOT DETECTED NOT DETECTED Final   Candida krusei NOT DETECTED NOT DETECTED Final   Candida parapsilosis NOT DETECTED NOT DETECTED Final   Candida tropicalis NOT DETECTED NOT DETECTED Final   Cryptococcus neoformans/gattii NOT DETECTED NOT DETECTED Final   CTX-M ESBL NOT DETECTED NOT DETECTED Final   Carbapenem resistance IMP NOT DETECTED NOT DETECTED Final   Carbapenem resistance KPC NOT DETECTED NOT DETECTED Final   Carbapenem resistance NDM NOT DETECTED NOT DETECTED Final   Carbapenem resist OXA 48 LIKE NOT DETECTED NOT DETECTED Final   Carbapenem resistance VIM NOT DETECTED NOT DETECTED Final    Comment: Performed at [facility], [address] [place], [place] [address]  Culture, blood (routine x 2)     Status: None (Preliminary result)   Collection Time: 08/06/21  7:35 PM   Specimen: BLOOD  Result Value Ref Range Status   Specimen Description   Final    BLOOD BLOOD RIGHT HAND Performed at [facility], [address] [place], [place] [address]    Special Requests   Final    BOTTLES DRAWN AEROBIC AND ANAEROBIC Blood Culture adequate volume Performed at [facility] [address] [place], [place] [address]    Culture  Setup Time   Final    GRAM NEGATIVE RODS IN BOTH AEROBIC AND ANAEROBIC BOTTLES CRITICAL VALUE NOTED.  VALUE IS CONSISTENT WITH PREVIOUSLY REPORTED AND CALLED VALUE. Performed at [facility], [place] [address], [place], [place] [address]    Culture GRAM NEGATIVE RODS  Final   Report Status PENDING  Incomplete  Urine Culture     Status: Abnormal (Preliminary result)   Collection Time: 08/06/21  8:00 PM   Specimen: In/Out Cath Urine  Result Value  Ref Range Status   Specimen Description   Final    IN/OUT CATH URINE Performed at [facility], [address] [place], [place] [address]    Special Requests   Final    NONE Performed at [facility], [address] [place], [place] [address]    Culture >=100,000 COLONIES/mL GRAM NEGATIVE RODS (A)  Final   Report Status PENDING  Incomplete  MRSA Next Gen by PCR, Nasal     Status: None   Collection Time: 08/07/21 12:00 AM   Specimen: Nasal Mucosa; Nasal Swab  Result Value Ref Range Status   MRSA by PCR Next Gen NOT DETECTED NOT DETECTED Final    Comment: (NOTE) The GeneXpert MRSA Assay (FDA approved for NASAL specimens only), is one component of a comprehensive MRSA colonization surveillance program. It is not intended to diagnose MRSA infection nor to guide or monitor treatment for MRSA infections. Test performance is not FDA approved in patients less than 72 years old. Performed at [facility], [place] [address] [place], [place] [address]          Radiology Studies: CT Chest W Contrast  Result Date: 08/06/2021 CLINICAL DATA:  Abdominal pain and fever. Syncopal episode yesterday. Fever and chills. Pain to the left breast area. EXAM: CT CHEST, ABDOMEN, AND PELVIS WITH CONTRAST TECHNIQUE: Multidetector CT imaging of the chest, abdomen and pelvis was performed following the standard protocol during bolus administration of intravenous contrast. CONTRAST:  83m OMNIPAQUE IOHEXOL 300 MG/ML  SOLN COMPARISON:  CT abdomen and pelvis 04/09/2008. Cardiac CT chest 12/19/2019 FINDINGS: CT CHEST FINDINGS Cardiovascular: Normal heart size. No pericardial effusion. Coronary artery and aortic calcification. No aortic aneurysm. Great vessel origins are patent. Mediastinum/Nodes: Thyroid gland is unremarkable. Esophagus is decompressed. No significant lymphadenopathy. Lungs/Pleura: Linear atelectasis in the lung bases. No airspace disease or consolidation. No  pleural effusions. No pneumothorax. Musculoskeletal: No acute bony abnormalities. CT ABDOMEN PELVIS FINDINGS Hepatobiliary: No focal liver abnormality is seen. No gallstones, gallbladder wall thickening, or biliary dilatation. Pancreas: Unremarkable. No pancreatic ductal dilatation or surrounding inflammatory changes. Spleen: Normal in size without focal abnormality. Adrenals/Urinary Tract: Adrenal glands are unremarkable. Kidneys are normal, without renal calculi, focal lesion, or hydronephrosis. Bladder is unremarkable. Stomach/Bowel: Stomach, small bowel, and colon are not abnormally distended. No wall thickening or inflammatory changes are appreciated. Appendix is normal. Vascular/Lymphatic: Calcification of the aorta. No aneurysm. No significant lymphadenopathy. Reproductive: Status post hysterectomy. No adnexal masses. Other: No free air or free fluid. Abdominal wall musculature appears intact. Musculoskeletal: No acute bony abnormalities. IMPRESSION: 1. No acute abnormalities demonstrated in the chest, abdomen, or pelvis. 2. Aortic atherosclerosis. 3. Linear atelectasis in the lung bases. Electronically Signed   By: WLucienne CapersM.D.   On: 08/06/2021 21:11   CT Abdomen Pelvis W Contrast  Result Date: 08/06/2021 CLINICAL DATA:  Abdominal pain and fever. Syncopal episode yesterday. Fever and chills. Pain to the left breast area. EXAM: CT CHEST, ABDOMEN, AND PELVIS WITH CONTRAST TECHNIQUE: Multidetector CT imaging of the chest, abdomen and pelvis was performed following the standard protocol during bolus administration of intravenous contrast. CONTRAST:  783mOMNIPAQUE IOHEXOL 300 MG/ML  SOLN COMPARISON:  CT abdomen and pelvis 04/09/2008. Cardiac CT chest 12/19/2019 FINDINGS: CT CHEST FINDINGS Cardiovascular: Normal heart size. No pericardial effusion. Coronary artery and aortic calcification. No aortic aneurysm. Great vessel origins are patent. Mediastinum/Nodes: Thyroid gland is unremarkable. Esophagus  is decompressed. No significant lymphadenopathy. Lungs/Pleura: Linear atelectasis in  the lung bases. No airspace disease or consolidation. No pleural effusions. No pneumothorax. Musculoskeletal: No acute bony abnormalities. CT ABDOMEN PELVIS FINDINGS Hepatobiliary: No focal liver abnormality is seen. No gallstones, gallbladder wall thickening, or biliary dilatation. Pancreas: Unremarkable. No pancreatic ductal dilatation or surrounding inflammatory changes. Spleen: Normal in size without focal abnormality. Adrenals/Urinary Tract: Adrenal glands are unremarkable. Kidneys are normal, without renal calculi, focal lesion, or hydronephrosis. Bladder is unremarkable. Stomach/Bowel: Stomach, small bowel, and colon are not abnormally distended. No wall thickening or inflammatory changes are appreciated. Appendix is normal. Vascular/Lymphatic: Calcification of the aorta. No aneurysm. No significant lymphadenopathy. Reproductive: Status post hysterectomy. No adnexal masses. Other: No free air or free fluid. Abdominal wall musculature appears intact. Musculoskeletal: No acute bony abnormalities. IMPRESSION: 1. No acute abnormalities demonstrated in the chest, abdomen, or pelvis. 2. Aortic atherosclerosis. 3. Linear atelectasis in the lung bases. Electronically Signed   By: Lucienne Capers M.D.   On: 08/06/2021 21:11   DG Chest Port 1 View  Result Date: 08/06/2021 CLINICAL DATA:  Syncope EXAM: PORTABLE CHEST 1 VIEW COMPARISON:  07/09/2020 FINDINGS: The heart size and mediastinal contours are within normal limits. Both lungs are clear. The visualized skeletal structures are unremarkable. IMPRESSION: No active disease. Electronically Signed   By: Rolm Baptise M.D.   On: 08/06/2021 18:46        Scheduled Meds:  aspirin  81 mg Oral Daily   atorvastatin  40 mg Oral Daily   Chlorhexidine Gluconate Cloth  6 each Topical Daily   DULoxetine  60 mg Oral Daily   enoxaparin (LOVENOX) injection  40 mg Subcutaneous Q24H    insulin aspart  0-15 Units Subcutaneous TID WC   insulin aspart  0-5 Units Subcutaneous QHS   insulin glargine-yfgn  20 Units Subcutaneous Daily   losartan  100 mg Oral Daily   potassium chloride  40 mEq Oral Q4H   Continuous Infusions:  cefTRIAXone (ROCEPHIN)  IV Stopped (08/07/21 1606)   lactated ringers 100 mL/hr at 08/08/21 0416          Aline August, MD [facility] 08/08/2021, 7:22 AM '

## 2021-08-08 NOTE — Evaluation (Signed)
Physical Therapy Evaluation Patient Details Name: Christine [name] MRN: [id number] DOB: [birth year] Today's Date: 08/08/2021   History of Present Illness  Pt is 70 yo female who presented on 08/06/21 with syncopal episode, fever, chills, headache.  Pt admitted with sepsis likely due to UTI.  Troponin elevated - likely demand ischemia per notes.  Of note, pt reports multiple syncopal episodes over the last few months for which she has had/continues to have outpt appointments and wore heart monitor at some point.  Also, had MRI earlier this month that did not show acute changes.   Pt with hx including but not limited to HTN, DMT2, fibromyalgia, hx of colonization of urine, hx of TIA   Clinical Impression  Pt admitted with above diagnosis. At baseline, pt is very independent.  Today, she ambulated but with mild instabilities.  During PT evaluation, pt reports episodes of syncope over the past few months- when she described how they occurred they were all with static standing.  During evaluation, pt was asymptomatic with walking but upon return to room had her static stand and BP down to 96/31 with MAP 46 and pt symptomatic (Baseline Bps 130's/60's) .  Notified RN of orthostatic hypotension. Pt currently with functional limitations due to the deficits listed below (see PT Problem List). Pt will benefit from skilled PT to increase their independence and safety with mobility to allow discharge to the venue listed below.       Follow Up Recommendations No PT follow up    Equipment Recommendations  None recommended by PT    Recommendations for Other Services       Precautions / Restrictions Precautions Precautions: Fall Precaution Comments: orthostatic hypotension      Mobility  Bed Mobility Overal bed mobility: Independent                  Transfers Overall transfer level: Needs assistance Equipment used: None Transfers: Sit to/from Stand Sit to Stand: Supervision;Min guard          General transfer comment: Initially supervision, but when pt reported syncopal symptoms that had occured with standing in past and therapist began testing bps provided min guard as pt became weak.  Ambulation/Gait Ambulation/Gait assistance: Min guard Gait Distance (Feet): 200 Feet Assistive device: None Gait Pattern/deviations: Step-through pattern;Drifts right/left;Narrow base of support;Scissoring Gait velocity: normal   General Gait Details: Pt with narrow BOS and occasional scissoring leading to drifting R/L in hallway but no overt LOB.  Denied dizziness or syncope with gait.  Cued for increased BOS.  Stairs            Wheelchair Mobility    Modified Rankin (Stroke Patients Only)       Balance   Sitting-balance support: No upper extremity supported Sitting balance-Leahy Scale: Good     Standing balance support: No upper extremity supported Standing balance-Leahy Scale: Good                               Pertinent Vitals/Pain Pain Assessment: No/denies pain    Home Living Family/patient expects to be discharged to:: Private residence Living Arrangements: Spouse/significant other Available Help at Discharge: Family;Available PRN/intermittently Type of Home: House Home Access: Stairs to enter Entrance Stairs-Rails: Right   Home Layout: One level Home Equipment: None      Prior Function Level of Independence: Independent         Comments: Pt completely independent with ADLs, IADLS, driving  and community ambulation. She reports episodes of syncope since early June.  She described as dizziness but with direct questions did seem to be more "blacking out" symptoms and not much "spinning."  Other than the syncopal symptoms that she was admitted with that persisted she reports other episodes occurred when standing/holding ladder for spouse and looking up, when cutting vegetables in kitchen, and when standing in church choir.  She denies  symptoms with rolling, head turns, or sit/supine     Hand Dominance        Extremity/Trunk Assessment   Upper Extremity Assessment Upper Extremity Assessment: Overall WFL for tasks assessed    Lower Extremity Assessment Lower Extremity Assessment: Overall WFL for tasks assessed    Cervical / Trunk Assessment Cervical / Trunk Assessment: Normal  Communication   Communication: No difficulties  Cognition Arousal/Alertness: Awake/alert Behavior During Therapy: WFL for tasks assessed/performed Overall Cognitive Status: Within Functional Limits for tasks assessed                                        General Comments   Pt had been up and ambulating with nursing in room without syncopal episodes or symptoms.  Spoke with RN and vitals had been stable so did not initially test orthostatic BP.  Had pt ambulate in hall and she felt fine.  During ambulation, began discussing her symptoms more and she reported history of syncope symptoms over the past months with static standing.  She would state that during these episodes she was dizzy but when further questioned stated not spinning just felt like she would black out and have to sit down.  Denies symptoms with head turns, rolling, sit/supine in past.  She has been seeing her PCP and had referral to cardiologist and wore holter monitor at some point.  She also had MRI as outpt that did not demonstrate acute changes.   Had pt perform head turns and nods at EOB - no symptoms. EOEM/tracking: no symptoms Head up and down for 30 second hold in sitting - no symptoms Static Standing - initially ok but after about 30 sec- 1 min she began to have weakness and clamminess.  Tried to get BP in standing but unable to maintain standing.  BP upon immediate sitting was 96/31 with MAP of 46.  Pt's earlier pressures 130'/60's.  After sitting 3 mins up to 102/56. Return to supine and up to 134/65.  Notified RN.  HR 100's rest, 120's ambulation,  but up to 130's with static stand .   Exercises     Assessment/Plan    PT Assessment Patient needs continued PT services  PT Problem List Decreased mobility;Decreased safety awareness;Decreased balance;Decreased knowledge of use of DME;Decreased activity tolerance       PT Treatment Interventions Therapeutic activities;DME instruction;Gait training;Therapeutic exercise;Patient/family education;Stair training;Balance training;Functional mobility training    PT Goals (Current goals can be found in the Care Plan section)  Acute Rehab PT Goals Patient Stated Goal: return home tomorrow PT Goal Formulation: With patient Time For Goal Achievement: 08/22/21 Potential to Achieve Goals: Good Additional Goals Additional Goal #1: Pt will score > 19 on DGI to indicate low fall risk    Frequency Min 3X/week   Barriers to discharge        Co-evaluation               AM-PAC PT "6 Clicks" Mobility  Outcome  Measure Help needed turning from your back to your side while in a flat bed without using bedrails?: None Help needed moving from lying on your back to sitting on the side of a flat bed without using bedrails?: None Help needed moving to and from a bed to a chair (including a wheelchair)?: A Little Help needed standing up from a chair using your arms (e.g., wheelchair or bedside chair)?: A Little Help needed to walk in hospital room?: A Little Help needed climbing 3-5 steps with a railing? : A Little 6 Click Score: 20    End of Session Equipment Utilized During Treatment: Gait belt Activity Tolerance: Patient tolerated treatment well Patient left: in bed;with call bell/phone within reach;with bed alarm set Nurse Communication: Mobility status (orthostatic bp) PT Visit Diagnosis: Other abnormalities of gait and mobility (R26.89);Dizziness and giddiness (R42)    Time: 1400-1431 PT Time Calculation (min) (ACUTE ONLY): 31 min   Charges:   PT Evaluation $PT Eval Moderate  Complexity: 1 Mod PT Treatments $Gait Training: 8-22 mins        Abran Richard, PT Acute Rehab Services Pager [phone] [facility] [place] 08/08/2021, 4:41 PM

## 2021-08-09 DIAGNOSIS — A4151 Sepsis due to Escherichia coli [E. coli]: Principal | ICD-10-CM

## 2021-08-09 LAB — CULTURE, BLOOD (ROUTINE X 2)
Special Requests: ADEQUATE
Special Requests: ADEQUATE

## 2021-08-09 LAB — CBC WITH DIFFERENTIAL/PLATELET
Abs Immature Granulocytes: 0.03 10*3/uL (ref 0.00–0.07)
Basophils Absolute: 0 10*3/uL (ref 0.0–0.1)
Basophils Relative: 0 %
Eosinophils Absolute: 0.1 10*3/uL (ref 0.0–0.5)
Eosinophils Relative: 3 %
HCT: 30.3 % — ABNORMAL LOW (ref 36.0–46.0)
Hemoglobin: 10.2 g/dL — ABNORMAL LOW (ref 12.0–15.0)
Immature Granulocytes: 1 %
Lymphocytes Relative: 22 %
Lymphs Abs: 1 10*3/uL (ref 0.7–4.0)
MCH: 29.7 pg (ref 26.0–34.0)
MCHC: 33.7 g/dL (ref 30.0–36.0)
MCV: 88.3 fL (ref 80.0–100.0)
Monocytes Absolute: 0.7 10*3/uL (ref 0.1–1.0)
Monocytes Relative: 16 %
Neutro Abs: 2.6 10*3/uL (ref 1.7–7.7)
Neutrophils Relative %: 58 %
Platelets: 127 10*3/uL — ABNORMAL LOW (ref 150–400)
RBC: 3.43 MIL/uL — ABNORMAL LOW (ref 3.87–5.11)
RDW: 13.2 % (ref 11.5–15.5)
WBC: 4.5 10*3/uL (ref 4.0–10.5)
nRBC: 0 % (ref 0.0–0.2)

## 2021-08-09 LAB — BASIC METABOLIC PANEL
Anion gap: 6 (ref 5–15)
BUN: 9 mg/dL (ref 8–23)
CO2: 25 mmol/L (ref 22–32)
Calcium: 8.4 mg/dL — ABNORMAL LOW (ref 8.9–10.3)
Chloride: 108 mmol/L (ref 98–111)
Creatinine, Ser: 0.56 mg/dL (ref 0.44–1.00)
GFR, Estimated: >60 mL/min (ref >60)
Glucose, Bld: 134 mg/dL — ABNORMAL HIGH (ref 70–99)
Potassium: 3.6 mmol/L (ref 3.5–5.1)
Sodium: 139 mmol/L (ref 135–145)

## 2021-08-09 LAB — GLUCOSE, CAPILLARY: Glucose-Capillary: 112 mg/dL — ABNORMAL HIGH (ref 70–99)

## 2021-08-09 LAB — URINE CULTURE: Culture: >=100000 — AB

## 2021-08-09 LAB — MAGNESIUM: Magnesium: 1.7 mg/dL (ref 1.7–2.4)

## 2021-08-09 MED ORDER — CEPHALEXIN 500 MG PO CAPS: 500.0000 mg | ORAL_CAPSULE | Freq: Four times a day (QID) | ORAL | 0 refills | Status: AC

## 2021-08-09 MED ORDER — OMEPRAZOLE 20 MG PO CPDR: 20.0000 mg | DELAYED_RELEASE_CAPSULE | Freq: Every day | ORAL | Status: DC

## 2021-08-09 NOTE — Discharge Summary (Signed)
Physician Discharge Summary  Elizabethmarie Viscusi [id number] DOB: Dec 16, 1950 DOA: 08/06/2021  PCP: [name], MD  Admit date: 08/06/2021 Discharge date: 08/09/2021  Admitted From: Home Disposition: Home  Recommendations for Outpatient Follow-up:  Follow up with PCP in 1 week with repeat CBC/BMP Follow up in ED if symptoms worsen or new appear   Home Health: No Equipment/Devices: None  Discharge Condition: Stable CODE STATUS: Full Diet recommendation: Heart healthy/carb modified diet  Brief/Interim Summary: 70 y.o. female with medical history significant for HTN, DMT2, fibromyalgia, hx of colonization of urine, hx of TIA presented with fever, chills and fatigue.  On presentation, she was tachycardic with WBC of 12,000 with normal renal function and chest x-ray.  CT of the chest abdomen and pelvis were negative.  UA was suspicious for UTI.  She was started on broad-spectrum antibiotics and IV fluids.  She was found to have E. coli bacteremia; antibiotics were narrowed.  Subsequently, she has become afebrile and hemodynamically stable.  She will be discharged home today on 7 more days of oral Keflex.  Outpatient follow-up with PCP.  Discharge Diagnoses:   Sepsis: Present on admission E. coli bacteremia and possible E. coli UTI: Present on admission Leukocytosis: Resolved -Patient presents with fever and tachycardia with possible UTI and now has bacteremia. -Antibiotics switched to Rocephin.  Currently not spiking temperatures. -Influenza and COVID-19 test negative so far -Sepsis has resolved. -E. coli sensitivities available:[name]  Discharge patient home today on 7 more days of oral Keflex.  Outpatient follow-up with PCP.   Elevated troponin -Possibly from demand ischemia.  No chest pain.  No ischemic changes on EKG.  No need for further intervention   Diabetes mellitus type 2 -Continue home regimen.  Carb modified diet.  Hypertension -Blood pressure on the lower  side.  Continue losartan.  Fibromyalgia-continue Cymbalta  Hyperlipidemia-continue statin  Discharge Instructions  Discharge Instructions     Diet - low sodium heart healthy   Complete by: As directed    Increase activity slowly   Complete by: As directed       Allergies as of 08/09/2021       Reactions   Vioxx [rofecoxib] Other (See Comments)   Excess BP which caused TIA   Prednisone Other (See Comments)   Mental status changes with high-dose oral agent. Tolerates epidural steroid injections. No associated rash or fever   Macrodantin [nitrofurantoin Macrocrystal] Hives   Sulfa Antibiotics    Hives   Colesevelam Other (See Comments)   Leg Pain        Medication List     STOP taking these medications    traMADol 50 MG tablet Commonly known as: ULTRAM       TAKE these medications    aspirin 81 MG tablet Take 81 mg by mouth daily.   atorvastatin 40 MG tablet Commonly known as: LIPITOR Take 1 tablet (40 mg total) by mouth daily.   Basaglar KwikPen 100 UNIT/ML Inject 20 Units into the skin every morning.   BD Pen Needle Nano 2nd Gen 32G X 4 MM Misc Generic drug: Insulin Pen Needle USE ONCE A DAY AS DIRECTED   busPIRone 15 MG tablet Commonly known as: BUSPAR Take 15 mg by mouth 3 (three) times daily.   cephALEXin 500 MG capsule Commonly known as: KEFLEX Take 1 capsule (500 mg total) by mouth 4 (four) times daily for 7 days. What changed:  medication strength how much to take when to take this   DULoxetine 60 MG  capsule Commonly known as: CYMBALTA Take 60 mg by mouth daily.   fluticasone 50 MCG/ACT nasal spray Commonly known as: FLONASE Place 2 sprays into both nostrils daily.   loratadine 10 MG tablet Commonly known as: CLARITIN Take 10 mg by mouth daily.   LORazepam 0.5 MG tablet Commonly known as: ATIVAN Take 0.5 mg by mouth as needed for anxiety.   losartan 100 MG tablet Commonly known as: COZAAR Take 1 tablet (100 mg total) by  mouth daily.   meclizine 25 MG tablet Commonly known as: ANTIVERT Take 1 tablet (25 mg total) by mouth daily as needed for dizziness.   naproxen 500 MG tablet Commonly known as: NAPROSYN Take 1 tablet (500 mg total) by mouth 2 (two) times daily with a meal.   omeprazole 20 MG capsule Commonly known as: PRILOSEC Take 1 capsule (20 mg total) by mouth daily.   OneTouch Ultra test strip Generic drug: glucose blood USE 1 STRIP TWICE DAILY DX CODE E11.65   onetouch ultrasoft lancets Check blood sugar once daily. Dx code: 250.00   Ozempic (0.25 or 0.5 MG/DOSE) 2 MG/1.5ML Sopn Generic drug: Semaglutide(0.25 or 0.'5MG'$ /DOS) Inject 0.5 mg into the skin once a week. What changed: additional instructions   promethazine 12.5 MG tablet Commonly known as: PHENERGAN Take 1 tablet (12.5 mg total) by mouth every 8 (eight) hours as needed for nausea or vomiting.   tiZANidine 2 MG tablet Commonly known as: ZANAFLEX Take 1 tablet (2 mg total) by mouth at bedtime.   traZODone 50 MG tablet Commonly known as: DESYREL TAKE 1/2 TO 1 TABLET BY MOUTH AT BEDTIME AS NEEDED FOR SLEEP What changed:  how much to take when to take this additional instructions   Vitamin D (Ergocalciferol) 1.25 MG (50000 UNIT) Caps capsule Commonly known as: DRISDOL TAKE 1 CAPSULE BY MOUTH ONE TIME PER WEEK What changed: See the new instructions.        Follow-up Information     [name], MD. Schedule an appointment as soon as possible for a visit in 1 week(s).   Specialty: Internal Medicine Why: with repeat cbc/bmp Contact information: [place] [address] [phone]         Belva Crome, MD .   Specialty: Cardiology Contact information: [phone] [place] [address] [phone]                Allergies  Allergen Reactions   Vioxx [Rofecoxib] Other (See Comments)    Excess BP which caused TIA    Prednisone Other (See Comments)     Mental status changes with high-dose oral agent. Tolerates epidural steroid injections. No associated rash or fever   Macrodantin [Nitrofurantoin Macrocrystal] Hives   Sulfa Antibiotics     Hives   Colesevelam Other (See Comments)    Leg Pain    Consultations: None   Procedures/Studies: CT Chest W Contrast  Result Date: 08/06/2021 CLINICAL DATA:  Abdominal pain and fever. Syncopal episode yesterday. Fever and chills. Pain to the left breast area. EXAM: CT CHEST, ABDOMEN, AND PELVIS WITH CONTRAST TECHNIQUE: Multidetector CT imaging of the chest, abdomen and pelvis was performed following the standard protocol during bolus administration of intravenous contrast. CONTRAST:  66m OMNIPAQUE IOHEXOL 300 MG/ML  SOLN COMPARISON:  CT abdomen and pelvis 04/09/2008. Cardiac CT chest 12/19/2019 FINDINGS: CT CHEST FINDINGS Cardiovascular: Normal heart size. No pericardial effusion. Coronary artery and aortic calcification. No aortic aneurysm. Great vessel origins are patent. Mediastinum/Nodes: Thyroid  gland is unremarkable. Esophagus is decompressed. No significant lymphadenopathy. Lungs/Pleura: Linear atelectasis in the lung bases. No airspace disease or consolidation. No pleural effusions. No pneumothorax. Musculoskeletal: No acute bony abnormalities. CT ABDOMEN PELVIS FINDINGS Hepatobiliary: No focal liver abnormality is seen. No gallstones, gallbladder wall thickening, or biliary dilatation. Pancreas: Unremarkable. No pancreatic ductal dilatation or surrounding inflammatory changes. Spleen: Normal in size without focal abnormality. Adrenals/Urinary Tract: Adrenal glands are unremarkable. Kidneys are normal, without renal calculi, focal lesion, or hydronephrosis. Bladder is unremarkable. Stomach/Bowel: Stomach, small bowel, and colon are not abnormally distended. No wall thickening or inflammatory changes are appreciated. Appendix is normal. Vascular/Lymphatic: Calcification of the aorta. No aneurysm. No  significant lymphadenopathy. Reproductive: Status post hysterectomy. No adnexal masses. Other: No free air or free fluid. Abdominal wall musculature appears intact. Musculoskeletal: No acute bony abnormalities. IMPRESSION: 1. No acute abnormalities demonstrated in the chest, abdomen, or pelvis. 2. Aortic atherosclerosis. 3. Linear atelectasis in the lung bases. Electronically Signed   By: Lucienne Capers M.D.   On: 08/06/2021 21:11   MR Brain Wo Contrast  Result Date: 07/23/2021 CLINICAL DATA:  Dizziness R42 (ICD-10-CM). Dizziness, non-specific. Additional history provided by scanning technologist: Patient reports dizziness, weakness, symptoms for 5 months. EXAM: MRI HEAD WITHOUT CONTRAST TECHNIQUE: Multiplanar, multiecho pulse sequences of the brain and surrounding structures were obtained without intravenous contrast. COMPARISON:  Prior brain MRI 02/13/2020. FINDINGS: Brain: Mild generalized cerebral and cerebellar atrophy. Redemonstrated small chronic cortically based infarct within the lateral right frontal lobe (MCA vascular territory). Mild multifocal T2/FLAIR hyperintensity within the cerebral white matter, nonspecific but compatible with chronic small vessel ischemic disease. There is no acute infarct. No evidence of an intracranial mass. No chronic intracranial blood products. No extra-axial fluid collection. No midline shift. Vascular: Maintained flow voids within the proximal large arterial vessels. Skull and upper cervical spine: No focal suspicious marrow lesion. Sinuses/Orbits: Visualized orbits show no acute finding. No significant paranasal sinus disease. IMPRESSION: No evidence of acute intracranial abnormality. Stable non-contrast MRI appearance of the brain as compared to 02/13/2020. Small chronic cortically based infarct within the right frontal lobe (MCA territory). Mild chronic small vessel ischemic changes within the cerebral white matter. Mild generalized parenchymal atrophy.  Electronically Signed   By: Kellie Simmering DO   On: 07/23/2021 18:30   CT Abdomen Pelvis W Contrast  Result Date: 08/06/2021 CLINICAL DATA:  Abdominal pain and fever. Syncopal episode yesterday. Fever and chills. Pain to the left breast area. EXAM: CT CHEST, ABDOMEN, AND PELVIS WITH CONTRAST TECHNIQUE: Multidetector CT imaging of the chest, abdomen and pelvis was performed following the standard protocol during bolus administration of intravenous contrast. CONTRAST:  38m OMNIPAQUE IOHEXOL 300 MG/ML  SOLN COMPARISON:  CT abdomen and pelvis 04/09/2008. Cardiac CT chest 12/19/2019 FINDINGS: CT CHEST FINDINGS Cardiovascular: Normal heart size. No pericardial effusion. Coronary artery and aortic calcification. No aortic aneurysm. Great vessel origins are patent. Mediastinum/Nodes: Thyroid gland is unremarkable. Esophagus is decompressed. No significant lymphadenopathy. Lungs/Pleura: Linear atelectasis in the lung bases. No airspace disease or consolidation. No pleural effusions. No pneumothorax. Musculoskeletal: No acute bony abnormalities. CT ABDOMEN PELVIS FINDINGS Hepatobiliary: No focal liver abnormality is seen. No gallstones, gallbladder wall thickening, or biliary dilatation. Pancreas: Unremarkable. No pancreatic ductal dilatation or surrounding inflammatory changes. Spleen: Normal in size without focal abnormality. Adrenals/Urinary Tract: Adrenal glands are unremarkable. Kidneys are normal, without renal calculi, focal lesion, or hydronephrosis. Bladder is unremarkable. Stomach/Bowel: Stomach, small bowel, and colon are not abnormally distended. No wall thickening or inflammatory  changes are appreciated. Appendix is normal. Vascular/Lymphatic: Calcification of the aorta. No aneurysm. No significant lymphadenopathy. Reproductive: Status post hysterectomy. No adnexal masses. Other: No free air or free fluid. Abdominal wall musculature appears intact. Musculoskeletal: No acute bony abnormalities. IMPRESSION: 1.  No acute abnormalities demonstrated in the chest, abdomen, or pelvis. 2. Aortic atherosclerosis. 3. Linear atelectasis in the lung bases. Electronically Signed   By: Lucienne Capers M.D.   On: 08/06/2021 21:11   DG Chest Port 1 View  Result Date: 08/06/2021 CLINICAL DATA:  Syncope EXAM: PORTABLE CHEST 1 VIEW COMPARISON:  07/09/2020 FINDINGS: The heart size and mediastinal contours are within normal limits. Both lungs are clear. The visualized skeletal structures are unremarkable. IMPRESSION: No active disease. Electronically Signed   By: Rolm Baptise M.D.   On: 08/06/2021 18:46      Subjective: Patient seen and examined at bedside.  No overnight fever, vomiting, worsening shortness of breath reported.  Patient feels better and thinks that she is ready to go home today.  Discharge Exam: Vitals:   08/09/21 1034 08/09/21 1035  BP:    Pulse: 74 80  Resp: 14 18  Temp:    SpO2: 98% 97%    General: Pt is alert, awake, not in acute distress.  Currently on room air. Cardiovascular: rate controlled, S1/S2 + Respiratory: bilateral decreased breath sounds at bases Abdominal: Soft, NT, ND, bowel sounds + Extremities: Mild lower extremity edema present; no cyanosis    The results of significant diagnostics from this hospitalization (including imaging, microbiology, ancillary and laboratory) are listed below for reference.     Microbiology: Recent Results (from the past 240 hour(s))  Resp Panel by RT-PCR (Flu A&B, Covid) Nasopharyngeal Swab     Status: None   Collection Time: 08/06/21  6:41 PM   Specimen: Nasopharyngeal Swab; Nasopharyngeal(NP) swabs in vial transport medium  Result Value Ref Range Status   SARS Coronavirus 2 by RT PCR NEGATIVE NEGATIVE Final    Comment: (NOTE) SARS-CoV-2 target nucleic acids are NOT DETECTED.  The SARS-CoV-2 RNA is generally detectable in upper respiratory specimens during the acute phase of infection. The lowest concentration of SARS-CoV-2 viral  copies this assay can detect is 138 copies/mL. A negative result does not preclude SARS-Cov-2 infection and should not be used as the sole basis for treatment or other patient management decisions. A negative result may occur with  improper specimen collection/handling, submission of specimen other than nasopharyngeal swab, presence of viral mutation(s) within the areas targeted by this assay, and inadequate number of viral copies(<138 copies/mL). A negative result must be combined with clinical observations, patient history, and epidemiological information. The expected result is Negative.  Fact Sheet for Patients:  EntrepreneurPulse.com.au  Fact Sheet for Healthcare Providers:  IncredibleEmployment.be  This test is no t yet approved or cleared by the [place] FDA and  has been authorized for detection and/or diagnosis of SARS-CoV-2 by FDA under an Emergency Use Authorization (EUA). This EUA will remain  in effect (meaning this test can be used) for the duration of the COVID-19 declaration under Section 564(b)(1) of the Act, 21 U.S.C.section 360bbb-3(b)(1), unless the authorization is terminated  or revoked sooner.       Influenza A by PCR NEGATIVE NEGATIVE Final   Influenza B by PCR NEGATIVE NEGATIVE Final    Comment: (NOTE) The Xpert Xpress SARS-CoV-2/FLU/RSV plus assay is intended as an aid in the diagnosis of influenza from Nasopharyngeal swab specimens and should not be used as a sole basis  for treatment. Nasal washings and aspirates are unacceptable for Xpert Xpress SARS-CoV-2/FLU/RSV testing.  Fact Sheet for Patients: EntrepreneurPulse.com.au  Fact Sheet for Healthcare Providers: IncredibleEmployment.be  This test is not yet approved or cleared by the [place] FDA and has been authorized for detection and/or diagnosis of SARS-CoV-2 by FDA under an Emergency Use Authorization (EUA). This  EUA will remain in effect (meaning this test can be used) for the duration of the COVID-19 declaration under Section 564(b)(1) of the Act, 21 U.S.C. section 360bbb-3(b)(1), unless the authorization is terminated or revoked.  Performed at [facility], [address] [place], [place] [address]   Culture, blood (routine x 2)     Status: Abnormal (Preliminary result)   Collection Time: 08/06/21  7:30 PM   Specimen: BLOOD  Result Value Ref Range Status   Specimen Description   Final    BLOOD RIGHT ANTECUBITAL Performed at [facility], [address] [place], [place] [address]    Special Requests   Final    BOTTLES DRAWN AEROBIC AND ANAEROBIC Blood Culture adequate volume Performed at [facility], [address] [place], [place] [address]    Culture  Setup Time   Final    GRAM NEGATIVE RODS IN BOTH AEROBIC AND ANAEROBIC BOTTLES CRITICAL RESULT CALLED TO, READ BACK BY AND VERIFIED WITH: TERRRY GREEN PHARMD 08/07/21 '@1143'$  BY JW    Culture (A)  Final    ESCHERICHIA COLI SUSCEPTIBILITIES TO FOLLOW Performed at [facility], [place] [address] [place], [place] [address]    Report Status PENDING  Incomplete  Blood Culture ID Panel (Reflexed)     Status: Abnormal   Collection Time: 08/06/21  7:30 PM  Result Value Ref Range Status   Enterococcus faecalis NOT DETECTED NOT DETECTED Final   Enterococcus Faecium NOT DETECTED NOT DETECTED Final   Listeria monocytogenes NOT DETECTED NOT DETECTED Final   Staphylococcus species NOT DETECTED NOT DETECTED Final   Staphylococcus aureus (BCID) NOT DETECTED NOT DETECTED Final   Staphylococcus epidermidis NOT DETECTED NOT DETECTED Final   Staphylococcus lugdunensis NOT DETECTED NOT DETECTED Final   Streptococcus species NOT DETECTED NOT DETECTED Final   Streptococcus agalactiae NOT DETECTED NOT DETECTED Final   Streptococcus pneumoniae NOT DETECTED NOT DETECTED Final   Streptococcus pyogenes NOT DETECTED NOT  DETECTED Final   A.calcoaceticus-baumannii NOT DETECTED NOT DETECTED Final   Bacteroides fragilis NOT DETECTED NOT DETECTED Final   Enterobacterales DETECTED (A) NOT DETECTED Final    Comment: Enterobacterales represent a large order of gram negative bacteria, not a single organism. TERRY GREEN PHARMD 08/07/2021 '@1139'$  BY JW    Enterobacter cloacae complex NOT DETECTED NOT DETECTED Final   Escherichia coli DETECTED (A) NOT DETECTED Final    Comment: TERRY GREEN PHARMD 08/07/2021 '@1139'$  BY JW   Klebsiella aerogenes NOT DETECTED NOT DETECTED Final   Klebsiella oxytoca NOT DETECTED NOT DETECTED Final   Klebsiella pneumoniae NOT DETECTED NOT DETECTED Final   Proteus species NOT DETECTED NOT DETECTED Final   Salmonella species NOT DETECTED NOT DETECTED Final   Serratia marcescens NOT DETECTED NOT DETECTED Final   Haemophilus influenzae NOT DETECTED NOT DETECTED Final   Neisseria meningitidis NOT DETECTED NOT DETECTED Final   Pseudomonas aeruginosa NOT DETECTED NOT DETECTED Final   Stenotrophomonas maltophilia NOT DETECTED NOT DETECTED Final   Candida albicans NOT DETECTED NOT DETECTED Final   Candida auris NOT DETECTED NOT DETECTED Final   Candida glabrata NOT DETECTED  NOT DETECTED Final   Candida krusei NOT DETECTED NOT DETECTED Final   Candida parapsilosis NOT DETECTED NOT DETECTED Final   Candida tropicalis NOT DETECTED NOT DETECTED Final   Cryptococcus neoformans/gattii NOT DETECTED NOT DETECTED Final   CTX-M ESBL NOT DETECTED NOT DETECTED Final   Carbapenem resistance IMP NOT DETECTED NOT DETECTED Final   Carbapenem resistance KPC NOT DETECTED NOT DETECTED Final   Carbapenem resistance NDM NOT DETECTED NOT DETECTED Final   Carbapenem resist OXA 48 LIKE NOT DETECTED NOT DETECTED Final   Carbapenem resistance VIM NOT DETECTED NOT DETECTED Final    Comment: Performed at [facility], [place] [address] [place], [place] [address]  Culture, blood (routine x 2)     Status: None  (Preliminary result)   Collection Time: 08/06/21  7:35 PM   Specimen: BLOOD  Result Value Ref Range Status   Specimen Description   Final    BLOOD BLOOD RIGHT HAND Performed at [facility], [address] [place], [place] [address]    Special Requests   Final    BOTTLES DRAWN AEROBIC AND ANAEROBIC Blood Culture adequate volume Performed at [facility], [address] [place], [place] [address]    Culture  Setup Time   Final    GRAM NEGATIVE RODS IN BOTH AEROBIC AND ANAEROBIC BOTTLES CRITICAL VALUE NOTED.  VALUE IS CONSISTENT WITH PREVIOUSLY REPORTED AND CALLED VALUE.    Culture   Final    GRAM NEGATIVE RODS IDENTIFICATION TO FOLLOW Performed at [facility], [place] [address] [place], [place] [address]    Report Status PENDING  Incomplete  Urine Culture     Status: Abnormal   Collection Time: 08/06/21  8:00 PM   Specimen: In/Out Cath Urine  Result Value Ref Range Status   Specimen Description   Final    IN/OUT CATH URINE Performed at [facility], [address] [place], [place] [address]    Special Requests   Final    NONE Performed at [facility], [address] [place], [place] [address]    Culture >=100,000 COLONIES/mL ESCHERICHIA COLI (A)  Final   Report Status 08/09/2021 FINAL  Final   Organism ID, Bacteria ESCHERICHIA COLI (A)  Final      Susceptibility   Escherichia coli - MIC*    AMPICILLIN 4 SENSITIVE Sensitive     CEFAZOLIN <=4 SENSITIVE Sensitive     CEFEPIME <=0.12 SENSITIVE Sensitive     CEFTRIAXONE <=0.25 SENSITIVE Sensitive     CIPROFLOXACIN <=0.25 SENSITIVE Sensitive     GENTAMICIN <=1 SENSITIVE Sensitive     IMIPENEM <=0.25 SENSITIVE Sensitive     NITROFURANTOIN <=16 SENSITIVE Sensitive     TRIMETH/SULFA <=20 SENSITIVE Sensitive     AMPICILLIN/SULBACTAM <=2 SENSITIVE Sensitive     PIP/TAZO <=4 SENSITIVE Sensitive     * >=100,000 COLONIES/mL ESCHERICHIA COLI  MRSA Next Gen by PCR, Nasal      Status: None   Collection Time: 08/07/21 12:00 AM   Specimen: Nasal Mucosa; Nasal Swab  Result Value Ref Range Status   MRSA by PCR Next Gen NOT DETECTED NOT DETECTED Final    Comment: (NOTE) The GeneXpert MRSA Assay (FDA approved for NASAL specimens only), is one component of a comprehensive MRSA colonization surveillance program. It is not intended to diagnose MRSA infection nor to guide or monitor treatment for MRSA infections. Test performance is not FDA approved in patients less  than 62 years old. Performed at [facility], [place] [address] [place], [place] [address]      Labs: BNP (last 3 results) No results for input(s): BNP in the last 8760 hours. Basic Metabolic Panel: Recent Labs  Lab 08/06/21 1820 08/07/21 0527 08/08/21 0233 08/09/21 0246  NA 133* 139 137 139  K 3.5 3.0* 2.9* 3.6  CL 100 109 107 108  CO2 25 24 21* 25  GLUCOSE 190* 156* 134* 134*  BUN '19 12 8 9  '$ CREATININE 0.83 0.51 0.48 0.56  CALCIUM 8.9 8.0* 8.2* 8.4*  MG  --   --  1.7 1.7   Liver Function Tests: Recent Labs  Lab 08/06/21 1820 08/08/21 0233  AST 15 14*  ALT 21 20  ALKPHOS 60 55  BILITOT 0.9 0.8  PROT 6.7 5.6*  ALBUMIN 3.5 2.8*   Recent Labs  Lab 08/06/21 1820  LIPASE 24   No results for input(s): AMMONIA in the last 168 hours. CBC: Recent Labs  Lab 08/06/21 1820 08/07/21 0527 08/08/21 0233 08/09/21 0246  WBC 11.3* 9.7 7.3 4.5  NEUTROABS 8.8*  --  5.2 2.6  HGB 12.6 10.8* 10.5* 10.2*  HCT 37.6 33.1* 32.6* 30.3*  MCV 89.5 91.9 92.4 88.3  PLT 184 140* 134* 127*   Cardiac Enzymes: No results for input(s): CKTOTAL, CKMB, CKMBINDEX, TROPONINI in the last 168 hours. BNP: Invalid input(s): POCBNP CBG: Recent Labs  Lab 08/08/21 0720 08/08/21 1152 08/08/21 1643 08/08/21 2124 08/09/21 0731  GLUCAP 116* 158* 114* 187* 112*   D-Dimer No results for input(s): DDIMER in the last 72 hours. Hgb A1c No results for input(s): HGBA1C in the last 72  hours. Lipid Profile No results for input(s): CHOL, HDL, LDLCALC, TRIG, CHOLHDL, LDLDIRECT in the last 72 hours. Thyroid function studies No results for input(s): TSH, T4TOTAL, T3FREE, THYROIDAB in the last 72 hours.  Invalid input(s): FREET3 Anemia work up No results for input(s): VITAMINB12, FOLATE, FERRITIN, TIBC, IRON, RETICCTPCT in the last 72 hours. Urinalysis    Component Value Date/Time   COLORURINE YELLOW 08/06/2021 2000   APPEARANCEUR CLOUDY (A) 08/06/2021 2000   LABSPEC 1.015 08/06/2021 2000   PHURINE 6.0 08/06/2021 2000   GLUCOSEU NEGATIVE 08/06/2021 2000   HGBUR MODERATE (A) 08/06/2021 2000   HGBUR large 04/11/2008 1319   BILIRUBINUR NEGATIVE 08/06/2021 2000   BILIRUBINUR Negative 01/27/2021 1134   KETONESUR 15 (A) 08/06/2021 2000   PROTEINUR 30 (A) 08/06/2021 2000   UROBILINOGEN 0.2 01/27/2021 1134   UROBILINOGEN 0.2 10/05/2014 1508   NITRITE NEGATIVE 08/06/2021 2000   LEUKOCYTESUR NEGATIVE 08/06/2021 2000   Sepsis Labs Invalid input(s): PROCALCITONIN,  WBC,  LACTICIDVEN Microbiology Recent Results (from the past 240 hour(s))  Resp Panel by RT-PCR (Flu A&B, Covid) Nasopharyngeal Swab     Status: None   Collection Time: 08/06/21  6:41 PM   Specimen: Nasopharyngeal Swab; Nasopharyngeal(NP) swabs in vial transport medium  Result Value Ref Range Status   SARS Coronavirus 2 by RT PCR NEGATIVE NEGATIVE Final    Comment: (NOTE) SARS-CoV-2 target nucleic acids are NOT DETECTED.  The SARS-CoV-2 RNA is generally detectable in upper respiratory specimens during the acute phase of infection. The lowest concentration of SARS-CoV-2 viral copies this assay can detect is 138 copies/mL. A negative result does not preclude SARS-Cov-2 infection and should not be used as the sole basis for treatment or other patient management decisions. A negative result may occur with  improper specimen collection/handling, submission of specimen other than nasopharyngeal  swab, presence of  viral mutation(s) within the areas targeted by this assay, and inadequate number of viral copies(<138 copies/mL). A negative result must be combined with clinical observations, patient history, and epidemiological information. The expected result is Negative.  Fact Sheet for Patients:  EntrepreneurPulse.com.au  Fact Sheet for Healthcare Providers:  IncredibleEmployment.be  This test is no t yet approved or cleared by the [place] FDA and  has been authorized for detection and/or diagnosis of SARS-CoV-2 by FDA under an Emergency Use Authorization (EUA). This EUA will remain  in effect (meaning this test can be used) for the duration of the COVID-19 declaration under Section 564(b)(1) of the Act, 21 U.S.C.section 360bbb-3(b)(1), unless the authorization is terminated  or revoked sooner.       Influenza A by PCR NEGATIVE NEGATIVE Final   Influenza B by PCR NEGATIVE NEGATIVE Final    Comment: (NOTE) The Xpert Xpress SARS-CoV-2/FLU/RSV plus assay is intended as an aid in the diagnosis of influenza from Nasopharyngeal swab specimens and should not be used as a sole basis for treatment. Nasal washings and aspirates are unacceptable for Xpert Xpress SARS-CoV-2/FLU/RSV testing.  Fact Sheet for Patients: EntrepreneurPulse.com.au  Fact Sheet for Healthcare Providers: IncredibleEmployment.be  This test is not yet approved or cleared by the [place] FDA and has been authorized for detection and/or diagnosis of SARS-CoV-2 by FDA under an Emergency Use Authorization (EUA). This EUA will remain in effect (meaning this test can be used) for the duration of the COVID-19 declaration under Section 564(b)(1) of the Act, 21 U.S.C. section 360bbb-3(b)(1), unless the authorization is terminated or revoked.  Performed at [facility], [address] [place], [place] [address]   Culture, blood (routine x  2)     Status: Abnormal (Preliminary result)   Collection Time: 08/06/21  7:30 PM   Specimen: BLOOD  Result Value Ref Range Status   Specimen Description   Final    BLOOD RIGHT ANTECUBITAL Performed at [facility], [address] [place], [place] [address]    Special Requests   Final    BOTTLES DRAWN AEROBIC AND ANAEROBIC Blood Culture adequate volume Performed at [facility], [address] [place], [place] [address]    Culture  Setup Time   Final    GRAM NEGATIVE RODS IN BOTH AEROBIC AND ANAEROBIC BOTTLES CRITICAL RESULT CALLED TO, READ BACK BY AND VERIFIED WITH: TERRRY GREEN PHARMD 08/07/21 '@1143'$  BY JW    Culture (A)  Final    ESCHERICHIA COLI SUSCEPTIBILITIES TO FOLLOW Performed at [facility], [place] [address], [place], [place] [address]    Report Status PENDING  Incomplete  Blood Culture ID Panel (Reflexed)     Status: Abnormal   Collection Time: 08/06/21  7:30 PM  Result Value Ref Range Status   Enterococcus faecalis NOT DETECTED NOT DETECTED Final   Enterococcus Faecium NOT DETECTED NOT DETECTED Final   Listeria monocytogenes NOT DETECTED NOT DETECTED Final   Staphylococcus species NOT DETECTED NOT DETECTED Final   Staphylococcus aureus (BCID) NOT DETECTED NOT DETECTED Final   Staphylococcus epidermidis NOT DETECTED NOT DETECTED Final   Staphylococcus lugdunensis NOT DETECTED NOT DETECTED Final   Streptococcus species NOT DETECTED NOT DETECTED Final   Streptococcus agalactiae NOT DETECTED NOT DETECTED Final   Streptococcus pneumoniae NOT DETECTED NOT DETECTED Final   Streptococcus pyogenes NOT DETECTED NOT DETECTED Final   A.calcoaceticus-baumannii NOT DETECTED NOT DETECTED Final   Bacteroides fragilis NOT  DETECTED NOT DETECTED Final   Enterobacterales DETECTED (A) NOT DETECTED Final    Comment: Enterobacterales represent a large order of gram negative bacteria, not a single organism. TERRY GREEN PHARMD 08/07/2021 '@1139'$  BY JW     Enterobacter cloacae complex NOT DETECTED NOT DETECTED Final   Escherichia coli DETECTED (A) NOT DETECTED Final    Comment: TERRY GREEN PHARMD 08/07/2021 '@1139'$  BY JW   Klebsiella aerogenes NOT DETECTED NOT DETECTED Final   Klebsiella oxytoca NOT DETECTED NOT DETECTED Final   Klebsiella pneumoniae NOT DETECTED NOT DETECTED Final   Proteus species NOT DETECTED NOT DETECTED Final   Salmonella species NOT DETECTED NOT DETECTED Final   Serratia marcescens NOT DETECTED NOT DETECTED Final   Haemophilus influenzae NOT DETECTED NOT DETECTED Final   Neisseria meningitidis NOT DETECTED NOT DETECTED Final   Pseudomonas aeruginosa NOT DETECTED NOT DETECTED Final   Stenotrophomonas maltophilia NOT DETECTED NOT DETECTED Final   Candida albicans NOT DETECTED NOT DETECTED Final   Candida auris NOT DETECTED NOT DETECTED Final   Candida glabrata NOT DETECTED NOT DETECTED Final   Candida krusei NOT DETECTED NOT DETECTED Final   Candida parapsilosis NOT DETECTED NOT DETECTED Final   Candida tropicalis NOT DETECTED NOT DETECTED Final   Cryptococcus neoformans/gattii NOT DETECTED NOT DETECTED Final   CTX-M ESBL NOT DETECTED NOT DETECTED Final   Carbapenem resistance IMP NOT DETECTED NOT DETECTED Final   Carbapenem resistance KPC NOT DETECTED NOT DETECTED Final   Carbapenem resistance NDM NOT DETECTED NOT DETECTED Final   Carbapenem resist OXA 48 LIKE NOT DETECTED NOT DETECTED Final   Carbapenem resistance VIM NOT DETECTED NOT DETECTED Final    Comment: Performed at [facility], [address] [place], [place] [address]  Culture, blood (routine x 2)     Status: None (Preliminary result)   Collection Time: 08/06/21  7:35 PM   Specimen: BLOOD  Result Value Ref Range Status   Specimen Description   Final    BLOOD BLOOD RIGHT HAND Performed at [facility], [address] [place], [place] [address]    Special Requests   Final    BOTTLES DRAWN AEROBIC AND ANAEROBIC Blood Culture adequate  volume Performed at [facility], [address] [place], [place] [address]    Culture  Setup Time   Final    GRAM NEGATIVE RODS IN BOTH AEROBIC AND ANAEROBIC BOTTLES CRITICAL VALUE NOTED.  VALUE IS CONSISTENT WITH PREVIOUSLY REPORTED AND CALLED VALUE.    Culture   Final    GRAM NEGATIVE RODS IDENTIFICATION TO FOLLOW Performed at [facility], [place] [address] [place], [place] [address]    Report Status PENDING  Incomplete  Urine Culture     Status: Abnormal   Collection Time: 08/06/21  8:00 PM   Specimen: In/Out Cath Urine  Result Value Ref Range Status   Specimen Description   Final    IN/OUT CATH URINE Performed at [facility], [address] [place], [place] [address]    Special Requests   Final    NONE Performed at [facility], [address] [place], [place] [address]    Culture >=100,000 COLONIES/mL ESCHERICHIA COLI (A)  Final   Report Status 08/09/2021 FINAL  Final   Organism ID, Bacteria ESCHERICHIA COLI (A)  Final      Susceptibility   Escherichia coli - MIC*    AMPICILLIN 4 SENSITIVE Sensitive  CEFAZOLIN <=4 SENSITIVE Sensitive     CEFEPIME <=0.12 SENSITIVE Sensitive     CEFTRIAXONE <=0.25 SENSITIVE Sensitive     CIPROFLOXACIN <=0.25 SENSITIVE Sensitive     GENTAMICIN <=1 SENSITIVE Sensitive     IMIPENEM <=0.25 SENSITIVE Sensitive     NITROFURANTOIN <=16 SENSITIVE Sensitive     TRIMETH/SULFA <=20 SENSITIVE Sensitive     AMPICILLIN/SULBACTAM <=2 SENSITIVE Sensitive     PIP/TAZO <=4 SENSITIVE Sensitive     * >=100,000 COLONIES/mL ESCHERICHIA COLI  MRSA Next Gen by PCR, Nasal     Status: None   Collection Time: 08/07/21 12:00 AM   Specimen: Nasal Mucosa; Nasal Swab  Result Value Ref Range Status   MRSA by PCR Next Gen NOT DETECTED NOT DETECTED Final    Comment: (NOTE) The GeneXpert MRSA Assay (FDA approved for NASAL specimens only), is one component of a comprehensive MRSA colonization surveillance program. It is  not intended to diagnose MRSA infection nor to guide or monitor treatment for MRSA infections. Test performance is not FDA approved in patients less than 29 years old. Performed at [facility], [place] [address], [place], [place] [address]      Time coordinating discharge: 35 minutes  SIGNED:   Aline August, MD  [facility] 08/09/2021, 10:47 AM

## 2021-08-12 ENCOUNTER — Telehealth: Payer: Self-pay

## 2021-08-12 NOTE — Telephone Encounter (Signed)
Transition Care Management Follow-up Telephone Call Date of discharge and from where: 08/09/2021 from [facility] How have you been since you were released from the hospital? Very weak, but I'm increasing my activity very slowly (as advised) Any questions or concerns? No  Items Reviewed: Did the pt receive and understand the discharge instructions provided? Yes  Medications obtained and verified? Yes  Other? No  Any new allergies since your discharge? No  Dietary orders reviewed? Yes, heart healthy diet Do you have support at home? Yes , husband  [place] and Equipment/Supplies: Were home health services ordered? no If so, what is the name of the agency? none  Has the agency set up a time to come to the patient's home? not applicable Were any new equipment or medical supplies ordered?  No What is the name of the medical supply agency? no Were you able to get the supplies/equipment? not applicable Do you have any questions related to the use of the equipment or supplies? No  Functional Questionnaire: (I = Independent and D = Dependent) ADLs: I  Bathing/Dressing- I  Meal Prep- I  Eating- I  Maintaining continence- I  Transferring/Ambulation- I  Managing Meds- I  Follow up appointments reviewed:  PCP Hospital f/u appt confirmed? Yes  Scheduled to see Pricilla Holm, MD on 08/14/2021 @ 8:40 am. [facility] f/u appt confirmed? No   Are transportation arrangements needed? No  If their condition worsens, is the pt aware to call PCP or go to the Emergency Dept.? Yes Was the patient provided with contact information for the PCP's office or ED? Yes Was to pt encouraged to call back with questions or concerns? Yes

## 2021-08-14 ENCOUNTER — Other Ambulatory Visit: Payer: Self-pay

## 2021-08-14 ENCOUNTER — Ambulatory Visit (INDEPENDENT_AMBULATORY_CARE_PROVIDER_SITE_OTHER): Payer: Medicare Other | Admitting: Internal Medicine

## 2021-08-14 ENCOUNTER — Encounter: Payer: Self-pay | Admitting: Internal Medicine

## 2021-08-14 VITALS — BP 124/78 | HR 86 | Temp 97.9°F | Resp 18 | Ht 66.0 in | Wt 154.4 lb

## 2021-08-14 DIAGNOSIS — R3911 Hesitancy of micturition: Secondary | ICD-10-CM | POA: Diagnosis not present

## 2021-08-14 DIAGNOSIS — R42 Dizziness and giddiness: Secondary | ICD-10-CM

## 2021-08-14 DIAGNOSIS — E119 Type 2 diabetes mellitus without complications: Secondary | ICD-10-CM | POA: Diagnosis not present

## 2021-08-14 DIAGNOSIS — R7881 Bacteremia: Secondary | ICD-10-CM | POA: Diagnosis not present

## 2021-08-14 LAB — CBC
HCT: 36.9 % (ref 36.0–46.0)
Hemoglobin: 12.1 g/dL (ref 12.0–15.0)
MCHC: 32.7 g/dL (ref 30.0–36.0)
MCV: 88.9 fl (ref 78.0–100.0)
Platelets: 392 10*3/uL (ref 150.0–400.0)
RBC: 4.15 Mil/uL (ref 3.87–5.11)
RDW: 14.2 % (ref 11.5–15.5)
WBC: 6.7 10*3/uL (ref 4.0–10.5)

## 2021-08-14 LAB — COMPREHENSIVE METABOLIC PANEL
ALT: 37 U/L — ABNORMAL HIGH (ref 0–35)
AST: 21 U/L (ref 0–37)
Albumin: 3.8 g/dL (ref 3.5–5.2)
Alkaline Phosphatase: 61 U/L (ref 39–117)
BUN: 12 mg/dL (ref 6–23)
CO2: 29 mEq/L (ref 19–32)
Calcium: 9.4 mg/dL (ref 8.4–10.5)
Chloride: 99 mEq/L (ref 96–112)
Creatinine, Ser: 0.68 mg/dL (ref 0.40–1.20)
GFR: 87.14 mL/min (ref >60.00)
Glucose, Bld: 173 mg/dL — ABNORMAL HIGH (ref 70–99)
Potassium: 3.3 mEq/L — ABNORMAL LOW (ref 3.5–5.1)
Sodium: 137 mEq/L (ref 135–145)
Total Bilirubin: 0.5 mg/dL (ref 0.2–1.2)
Total Protein: 7.2 g/dL (ref 6.0–8.3)

## 2021-08-14 MED ORDER — CEPHALEXIN 250 MG PO CAPS: 250.0000 mg | ORAL_CAPSULE | Freq: Every day | ORAL | 3 refills | Status: DC

## 2021-08-14 NOTE — Patient Instructions (Addendum)
Let us know if not able to urinate we can send in flomax.  Finish the keflex and then we have sent in the low dose daily keflex to stay on.  We will check the labs today.

## 2021-08-14 NOTE — Progress Notes (Signed)
   Subjective:   Patient ID: Christine [name], female    DOB: [birth year], 70 y.o.   MRN: [id number]  HPI The patient is a 70 YO female coming in for hospital follow up (admitted for sepsis due to bactemia due to UTI, given broad spectrum antibiotics and then narrowed). Still taking high dose keflex for another few days. Has not had UTI in a few months. Previously on keflex daily for prevention and would like to resume. She was very sick and did not have a lot of warning prior to severe illness. Eating okay but not great appetite. Denies fevers or chills. No urinary symptoms. No diarrhea or vomiting or constipation.   Review of Systems  Constitutional:  Positive for activity change, appetite change and fatigue.  HENT: Negative.    Eyes: Negative.   Respiratory:  Negative for cough, chest tightness and shortness of breath.   Cardiovascular:  Negative for chest pain, palpitations and leg swelling.  Gastrointestinal:  Negative for abdominal distention, abdominal pain, constipation, diarrhea, nausea and vomiting.  Musculoskeletal: Negative.   Skin: Negative.   Neurological: Negative.   Psychiatric/Behavioral: Negative.     Objective:  Physical Exam Constitutional:      Appearance: She is well-developed.  HENT:     Head: Normocephalic and atraumatic.  Cardiovascular:     Rate and Rhythm: Normal rate and regular rhythm.  Pulmonary:     Effort: Pulmonary effort is normal. No respiratory distress.     Breath sounds: Normal breath sounds. No wheezing or rales.  Abdominal:     General: Bowel sounds are normal. There is no distension.     Palpations: Abdomen is soft.     Tenderness: There is no abdominal tenderness. There is no rebound.  Musculoskeletal:     Cervical back: Normal range of motion.  Skin:    General: Skin is warm and dry.  Neurological:     Mental Status: She is alert and oriented to person, place, and time.     Coordination: Coordination normal.    Vitals:    08/14/21 0836  BP: 124/78  Pulse: 86  Resp: 18  Temp: 97.9 F (36.6 C)  TempSrc: Oral  SpO2: 98%  Weight: 154 lb 6.4 oz (70 kg)  Height: '5\' 6"'$  (1.676 m)    This visit occurred during the SARS-CoV-2 public health emergency.  Safety protocols were in place, including screening questions prior to the visit, additional usage of staff PPE, and extensive cleaning of exam room while observing appropriate contact time as indicated for disinfecting solutions.   Assessment & Plan:

## 2021-08-15 DIAGNOSIS — R7881 Bacteremia: Secondary | ICD-10-CM | POA: Insufficient documentation

## 2021-08-15 NOTE — Assessment & Plan Note (Signed)
Stable sugar levels and no hypoglycemia or hyperglycemia since leaving hospital.

## 2021-08-15 NOTE — Assessment & Plan Note (Signed)
Finishing keflex and no urinary symptoms today. Will recheck U/A as needed with low suspicion given recent severe sepsis and bacteremia from UTI.

## 2021-08-15 NOTE — Assessment & Plan Note (Signed)
No repeat cultures done to show clearance of bacteremia so ordered blood culture times 1 today. Is currently still on keflex.

## 2021-08-15 NOTE — Assessment & Plan Note (Signed)
Had this for months but gone since her UTI is treated. Will monitor for recurrence. Recent cardiac monitoring reviewed with patient today.

## 2021-08-18 DIAGNOSIS — Z20822 Contact with and (suspected) exposure to covid-19: Secondary | ICD-10-CM | POA: Diagnosis not present

## 2021-08-19 ENCOUNTER — Other Ambulatory Visit: Payer: Self-pay

## 2021-08-19 ENCOUNTER — Ambulatory Visit (INDEPENDENT_AMBULATORY_CARE_PROVIDER_SITE_OTHER): Payer: Medicare Other | Admitting: Family Medicine

## 2021-08-19 ENCOUNTER — Encounter: Payer: Self-pay | Admitting: Family Medicine

## 2021-08-19 VITALS — BP 124/92 | HR 98 | Ht 66.0 in

## 2021-08-19 DIAGNOSIS — M5010 Cervical disc disorder with radiculopathy, unspecified cervical region: Secondary | ICD-10-CM | POA: Diagnosis not present

## 2021-08-19 DIAGNOSIS — M2550 Pain in unspecified joint: Secondary | ICD-10-CM | POA: Diagnosis not present

## 2021-08-19 DIAGNOSIS — R519 Headache, unspecified: Secondary | ICD-10-CM

## 2021-08-19 DIAGNOSIS — E538 Deficiency of other specified B group vitamins: Secondary | ICD-10-CM | POA: Diagnosis not present

## 2021-08-19 DIAGNOSIS — R7989 Other specified abnormal findings of blood chemistry: Secondary | ICD-10-CM

## 2021-08-19 LAB — COMPREHENSIVE METABOLIC PANEL
ALT: 25 U/L (ref 0–35)
AST: 15 U/L (ref 0–37)
Albumin: 3.8 g/dL (ref 3.5–5.2)
Alkaline Phosphatase: 70 U/L (ref 39–117)
BUN: 17 mg/dL (ref 6–23)
CO2: 27 mEq/L (ref 19–32)
Calcium: 9.5 mg/dL (ref 8.4–10.5)
Chloride: 102 mEq/L (ref 96–112)
Creatinine, Ser: 0.93 mg/dL (ref 0.40–1.20)
GFR: 61.53 mL/min (ref >60.00)
Glucose, Bld: 132 mg/dL — ABNORMAL HIGH (ref 70–99)
Potassium: 3.8 mEq/L (ref 3.5–5.1)
Sodium: 137 mEq/L (ref 135–145)
Total Bilirubin: 0.3 mg/dL (ref 0.2–1.2)
Total Protein: 7 g/dL (ref 6.0–8.3)

## 2021-08-19 LAB — CBC WITH DIFFERENTIAL/PLATELET
Basophils Absolute: 0.1 10*3/uL (ref 0.0–0.1)
Basophils Relative: 0.8 % (ref 0.0–3.0)
Eosinophils Absolute: 0.1 10*3/uL (ref 0.0–0.7)
Eosinophils Relative: 1.3 % (ref 0.0–5.0)
HCT: 36.3 % (ref 36.0–46.0)
Hemoglobin: 12.1 g/dL (ref 12.0–15.0)
Lymphocytes Relative: 25 % (ref 12.0–46.0)
Lymphs Abs: 1.9 10*3/uL (ref 0.7–4.0)
MCHC: 33.4 g/dL (ref 30.0–36.0)
MCV: 89.3 fl (ref 78.0–100.0)
Monocytes Absolute: 0.6 10*3/uL (ref 0.1–1.0)
Monocytes Relative: 7.3 % (ref 3.0–12.0)
Neutro Abs: 5 10*3/uL (ref 1.4–7.7)
Neutrophils Relative %: 65.6 % (ref 43.0–77.0)
Platelets: 436 10*3/uL — ABNORMAL HIGH (ref 150.0–400.0)
RBC: 4.07 Mil/uL (ref 3.87–5.11)
RDW: 14.6 % (ref 11.5–15.5)
WBC: 7.6 10*3/uL (ref 4.0–10.5)

## 2021-08-19 LAB — SEDIMENTATION RATE: Sed Rate: 31 mm/hr — ABNORMAL HIGH (ref 0–30)

## 2021-08-19 LAB — URINALYSIS
Bilirubin Urine: NEGATIVE
Hgb urine dipstick: NEGATIVE
Ketones, ur: NEGATIVE
Leukocytes,Ua: NEGATIVE
Nitrite: NEGATIVE
Specific Gravity, Urine: <=1.005 — AB (ref 1.000–1.030)
Total Protein, Urine: NEGATIVE
Urine Glucose: NEGATIVE
Urobilinogen, UA: 0.2 (ref 0.0–1.0)
pH: 6 (ref 5.0–8.0)

## 2021-08-19 LAB — VITAMIN B12: Vitamin B-12: 988 pg/mL — ABNORMAL HIGH (ref 211–911)

## 2021-08-19 NOTE — Progress Notes (Signed)
[place] [facility] [place] Phone: [phone] Subjective:   [name], am serving as a scribe for Dr. Hulan Saas.  This visit occurred during the SARS-CoV-2 public health emergency.  Safety protocols were in place, including screening questions prior to the visit, additional usage of staff PPE, and extensive cleaning of exam room while observing appropriate contact time as indicated for disinfecting solutions.   I'm seeing this patient by the request  of:  [name], MD  CC: Neck and headache  [id number]  Mirranda Weston Christine [name] is a 70 y.o. female coming in with complaint of back and neck pain. OMT 07/18/2021. Patient states that her neck pain has increased since last visit due to a fall and being in hospital. Pain in L cervical spine that is burning and stinging. Pain radiates into skull. Does note tenderness over entire scalp. Pain has improved today from yesterday. Passed out 2 weeks ago at home 2x.  Patient does have headache that waxes and wanes depending on the day but they have not gone away since falling. Patient also complains of having hard time coming up with words, inability of focus on reading but ok with watching tv.   Medications patient has been prescribed: Vit D  Taking:         Reviewed prior external information including notes and imaging from previsou exam, outside providers and external EMR if available.  This includes patient's recent admission for sepsis secondary to urinary tract infection.  Patient has had chronic UTIs previously and is on prophylactic Keflex at baseline.  As well as notes that were available from care everywhere and other healthcare systems.  Past medical history, social, surgical and family history all reviewed in electronic medical record.  No pertanent information unless stated regarding to the chief complaint.   Past Medical History:  Diagnosis Date   Acute  bursitis of right shoulder 08/29/2018   Right shoulder injected in August 29, 2018   Allergic rhinitis    Closed displaced fracture of fifth metatarsal bone of left foot 03/16/2017   DEPRESSIVE DISORDER NOT ELSEWHERE CLASSIFIED 03/27/2010   Qualifier: Diagnosis of  By: [name] MD, [name]   [name], NP , [facility] [place]     Diabetes ([place]) 04/02/2016   Diabetes mellitus    Essential hypertension 03/14/2008   Qualifier: Diagnosis of  By: [name] MD, [name]     Fibromyalgia 08/12/2011   Diagnosed by [name] , Rheumatologist 2010    GERD (gastroesophageal reflux disease) 12/25/2016   Gluteal tendonitis of right buttock 10/19/2017   Injected in October 19, 2017 Injected October 07, 2018   Greater trochanteric bursitis of left hip 06/17/2016   Greater trochanteric bursitis of right hip 11/27/2015   Injected 11/27/2015    History of recurrent UTIs    [name]   HTN (hypertension)    Hx of osteopenia 10/11/2012   [facility] DEXA; ordered by [name] Lowest T score: -2.1 @ lumbar spine @ [facility] 11/01/12; decrease of 8.5% vs 05/2009. There is 9.2% risk over 10 yrs History fracture left elbow @ 4 Fractured left wrist , right elbow and nose post fall July/18/2013. Dr. [name] No FH Osteoporosis No PMH of bisphosphonate therapy (generic Fosamax Rxed but not filled  due to polypharmacy )    Hyperlipidemia    Hyperlipidemia associated with type 2 diabetes mellitus ([place]) 01/18/2007   Qualifier: Diagnosis of  By: [name]  With DM LDL goal = < 100, ideally < 70. Mi in father @ 67; 2 brothers @ 38 & 50     Lumbar radiculopathy 04/11/2008   Qualifier: Diagnosis of  By: [name] MD, Erick Blinks well to epidural 01/12/2017  repeat epidural given May 2018   Migraine headache    quiescent   Nonallopathic lesion of cervical region 07/19/2014   Nonallopathic lesion of lumbosacral region 01/15/2016   Nonallopathic lesion of sacral region 06/27/2018   Nonallopathic lesion-rib cage  09/26/2014   Palpitations 04/15/2011   Rotator cuff impingement syndrome of left shoulder 05/11/2014   Seasonal allergic rhinitis 03/21/2012   Sinusitis, chronic 04/20/2016   Sleep disorder    Dr Dohmier   Subluxation of peroneal tendon of right foot 11/10/2017   TIA (transient ischemic attack)    [place], HX OF 02/02/2008   Qualifier: Diagnosis of  By: Donalee Citrin ADVRS EFF UNS RX MEDICINAL&BIOLOGICAL [place] 02/02/2008   Qualifier: Diagnosis of  By: [name] MD, Posey Pronto AND MYOSITIS 02/02/2008   Qualifier: Diagnosis of  By: [name] MD, [name]     UTI (urinary tract infection) 10/05/2014   Viral upper respiratory tract infection with cough 12/31/2014   Vitamin D deficiency 05/25/2008   Qualifier: Diagnosis of  By: [name] MD, [name]      Allergies  Allergen Reactions   Vioxx [Rofecoxib] Other (See Comments)    Excess BP which caused TIA    Prednisone Other (See Comments)    Mental status changes with high-dose oral agent. Tolerates epidural steroid injections. No associated rash or fever   Macrodantin [Nitrofurantoin Macrocrystal] Hives   Sulfa Antibiotics     Hives   Colesevelam Other (See Comments)    Leg Pain     Review of Systems:  No  visual changes, nausea, vomiting, diarrhea, constipation, abdominal pain, skin rash, fevers, chills, night sweats, swollen lymph nodes, joint swelling, chest pain, shortness of breath, mood changes. POSITIVE muscle aches, headache, body aches, mild dizziness  Objective  Blood pressure (!) 124/92, pulse 98, height '5\' 6"'$  (1.676 m), SpO2 95 %.   General: No apparent distress alert and oriented x3 mood and affect normal, dressed appropriately.  Patient does appear to be weaker.  Patient does have a wide-based gait noted.  Patient does have mild weakness in the C6 distribution of the left hand.  Patient did not seem quite as oriented as she normally does.  Had some mild problems with word finding  but no slurring of the speech.  Neck exam does have tightness noted.  Significant tightness noted in the of the parascapular region.  Patient has pain noted to even very light palpation that was out of proportion to the amount of pressure.     Assessment and Plan:      The above documentation has been reviewed and is accurate and complete Lyndal Pulley, DO        Note: This dictation was prepared with Dragon dictation along with smaller phrase technology. Any transcriptional errors that result from this process are unintentional.

## 2021-08-19 NOTE — Patient Instructions (Addendum)
Ct head [phone] If symptoms worsen, please seek medical care at the emergency room Can try 1/2 zanaflex at night Labs today See me again as scheduled Send message later this week

## 2021-08-19 NOTE — Assessment & Plan Note (Signed)
Patient has had cervical disc disorder with radicular symptoms previously.  Unfortunately patient did have 2 incidents where she may have fallen and may have hit her head but she does not now.  Recently in the hospital for sepsis.  Do think that this is somewhat concerning.  She will have CT of the head is necessary to further evaluate that if patient did have a stroke or a TIA.  Discussed with patient that if any worsening symptoms or headache or any weakness that is noted in such as what I see in her right upper extremity she needs to seek medical attention.  Patient recently was in the hospital and would like to avoid it if possible so we will monitor outpatient if possible.  Encouraged her to follow-up with her other doctors including her primary care physician as well.  Once again reiterated that if worsening symptoms to seek medical attention immediately.  We will also get laboratory work-up to further evaluate anything else that could be contributing to some of patient's symptomatology.

## 2021-08-20 ENCOUNTER — Encounter: Payer: Self-pay | Admitting: Internal Medicine

## 2021-08-20 ENCOUNTER — Encounter: Payer: Self-pay | Admitting: Family Medicine

## 2021-08-20 LAB — CULTURE, BLOOD (SINGLE)

## 2021-08-26 ENCOUNTER — Encounter: Payer: Self-pay | Admitting: Family Medicine

## 2021-08-26 ENCOUNTER — Telehealth: Payer: Self-pay | Admitting: Pharmacist

## 2021-08-26 ENCOUNTER — Ambulatory Visit
Admission: RE | Admit: 2021-08-26 | Discharge: 2021-08-26 | Disposition: A | Discharge status: Home / Self Care | Payer: Medicare Other | Source: Ambulatory Visit | Attending: Family Medicine | Admitting: Family Medicine

## 2021-08-26 DIAGNOSIS — R519 Headache, unspecified: Secondary | ICD-10-CM

## 2021-08-26 NOTE — Progress Notes (Signed)
Chronic Care Management Pharmacy Assistant   Name: Christine [name] MRN: [id number] DOB: Nov 08, 1951  Reason for Encounter: Disease State - General Adherence  Recent office visits:  08/14/21 Christine [name] (PCP) [facility] f/u for Bacteremia. Decrease Cephalexin to 250 mg. D/c loratadine, meclizine, naproxen & fluticasone.  07/09/21 Christine [name] (PCP) - Dizziness. D/c Metoprolol Tartrate 100 mg.  Recent consult visits:  08/19/21 Christine [name] (Sports Med) - New onset Headaches. D/c Cephalexin.  07/18/21 Christine [name] (Sports Med) -  Somatic dysfunction of spine, cervical. No med changes.  06/19/21 Christine [name] (Endo) - Type 2 diabetes. Decrease Insulin to 20 units. Increase Ozempic to 0.5 mg.  06/04/21 Christine [name] (Internal Med) - Encounter for general adult medical examination without abnormal findings  04/16/21 Christine [name]  Panic disorder (episodic paroxysmal anxiety)  04/08/21 Christine [name] (Sports Medicine) - Cervical disc disorder with radiculopathy of cervical region. No med changes.  03/20/21 Christine [name] (Endo) - Type 2 diabetes. Decrease Insulin from 55 to 35 units. F/u 3 mos.  02/12/21 Christine [name] (Sports Medicine) - Nonallopathic lesion of cervical region. No med changes.  Hospital visits:  Medication Reconciliation was completed by comparing discharge summary, patient's EMR and Pharmacy list, and upon discussion with patient.  Admitted to the hospital on 07/25/21 due to Sepsis. Discharge date was 08/09/21. Discharged from [facility]?Medications Started at [facility] Discharge:?? -started cephALEXin [facility])  Medications Discontinued at Hospital Discharge: -Stopped tramadol 50 mg  Medications that remain the same after Hospital Discharge:??  -All other medications will remain the same.    Medications: Outpatient Encounter Medications as of 08/26/2021  Medication Sig Note   acetaminophen (TYLENOL) 500 MG tablet Take 500 mg by mouth as needed.    aspirin 81 MG tablet Take 81 mg by mouth daily.    atorvastatin  (LIPITOR) 40 MG tablet Take 1 tablet (40 mg total) by mouth daily.    BD PEN NEEDLE NANO 2ND GEN 32G X 4 MM MISC USE ONCE A DAY AS DIRECTED    busPIRone (BUSPAR) 15 MG tablet Take 15 mg by mouth 3 (three) times daily.    DULoxetine (CYMBALTA) 60 MG capsule Take 60 mg by mouth daily.    Insulin Glargine (BASAGLAR KWIKPEN) 100 UNIT/ML Inject 20 Units into the skin every morning.    Lancets (ONETOUCH ULTRASOFT) lancets Check blood sugar once daily. Dx code: 250.00 04/21/2021: use   LORazepam (ATIVAN) 0.5 MG tablet Take 0.5 mg by mouth as needed for anxiety.    losartan (COZAAR) 100 MG tablet Take 1 tablet (100 mg total) by mouth daily.    omeprazole (PRILOSEC) 20 MG capsule Take 1 capsule (20 mg total) by mouth daily.    ONETOUCH ULTRA test strip USE 1 STRIP TWICE DAILY DX CODE E11.65    promethazine (PHENERGAN) 12.5 MG tablet Take 1 tablet (12.5 mg total) by mouth every 8 (eight) hours as needed for nausea or vomiting.    Semaglutide,0.25 or 0.'5MG'$ /DOS, (OZEMPIC, 0.25 OR 0.5 MG/DOSE,) 2 MG/1.5ML SOPN Inject 0.5 mg into the skin once a week. (Patient taking differently: Inject 0.5 mg into the skin once a week. Tuesday)    tiZANidine (ZANAFLEX) 2 MG tablet Take 1 tablet (2 mg total) by mouth at bedtime.    traZODone (DESYREL) 50 MG tablet TAKE 1/2 TO 1 TABLET BY MOUTH AT BEDTIME AS NEEDED FOR SLEEP (Patient taking differently: Take 50 mg by mouth as needed.)    Vitamin D, Ergocalciferol, (DRISDOL) 1.25 MG (50000 UNIT) CAPS capsule TAKE 1 CAPSULE BY MOUTH ONE  TIME PER WEEK (Patient taking differently: Take 50,000 Units by mouth every 7 (seven) days. Sunday)    No facility-administered encounter medications on file as of 08/26/2021.    Have you had any problems recently with your health? Patient states she is experiencing weakness and some memory loss since her hospital stay. Patient states it may be to early to tell but she wants to know if this is normal.  Have you had any problems with your  pharmacy? Patient states no problem with pharmacy.   What issues or side effects are you having with your medications? Patient states she was taking Augmentin and then stopped and didn't take the Keflex so she got a UTI.   What would you like me to pass along to [facility] for them to help you with?  Patient states nothing at this time.   What can we do to take care of you better? Patient states no concerns at this time.  Star Rating Drugs: Atorvastatin - last fill 08/04/21 90D Losartan Pot. - last fill 07/22/21 90D Ozempic - last fill 06/19/21 90D - received refills thru [facility]  Orinda Kenner, RMA Clinical Pharmacists Assistant [phone]  Time Spent: [phone]

## 2021-08-28 NOTE — Progress Notes (Signed)
[facility] [place] Phone: [phone] Subjective:   Christine [name], am serving as a scribe for Dr. Hulan Saas. This visit occurred during the SARS-CoV-2 public health emergency.  Safety protocols were in place, including screening questions prior to the visit, additional usage of staff PPE, and extensive cleaning of exam room while observing appropriate contact time as indicated for disinfecting solutions.   I'm seeing this patient by the request  of:  [name], MD  CC: Neck pain, continued dizziness with tachycardia  [id number]  08/19/2021 Patient has had cervical disc disorder with radicular symptoms previously.  Unfortunately patient did have 2 incidents where she may have fallen and may have hit her head but she does not now.  Recently in the hospital for sepsis.  Do think that this is somewhat concerning.  She will have CT of the head is necessary to further evaluate that if patient did have a stroke or a TIA.  Discussed with patient that if any worsening symptoms or headache or any weakness that is noted in such as what I see in her right upper extremity she needs to seek medical attention.  Patient recently was in the hospital and would like to avoid it if possible so we will monitor outpatient if possible.  Encouraged her to follow-up with her other doctors including her primary care physician as well.  Once again reiterated that if worsening symptoms to seek medical attention immediately.  We will also get laboratory work-up to further evaluate anything else that could be contributing to some of patient's symptomatology.  Update 09/02/2021 Christine [name] is a 70 y.o. female coming in with complaint of neck pain.  Patient was having some headaches as well.  Was sent for CT of the head.  This was independently visualized by me showing the patient did have mild chronic small vessel ischemic changes but  seems to be stable.  Patient states that he heart rate continues to go up at various times. States that she is having tingling in fingers and feels that legs and arms become weak. Standing in one place exacerbates her symptoms vs walking.  Patient states that there is [name] significant correlation with the activity sometimes that causes some of the symptoms.  Neck pain has improved since last visit. Patient notes burning sensation into neck and ears. Unable to touch ears due to pain.  Patient states more tightness than usual.  Sometimes seems to be giving her some more of the headaches.  L knee pain since yesterday. Felt a pop with sit to stand. Was unable to straighten her knee. Pain over medial aspect.  Patient states that she was unable to move the knee significantly but now is starting to improve again.  Patient is concerned now because any walking greater than 100 feet causes more discomfort.      Past Medical History:  Diagnosis Date   Acute bursitis of right shoulder 08/29/2018   Right shoulder injected in August 29, 2018   Allergic rhinitis    Closed displaced fracture of fifth metatarsal bone of left foot 03/16/2017   DEPRESSIVE DISORDER NOT ELSEWHERE CLASSIFIED 03/27/2010   Qualifier: Diagnosis of  By: [name] MD, [name]   [name], NP , [facility] [place]     Diabetes ([place]) 04/02/2016   Diabetes mellitus    Essential hypertension 03/14/2008   Qualifier: Diagnosis of  By: [name] MD, [name]  Fibromyalgia 08/12/2011   Diagnosed by [name] , Rheumatologist 2010    GERD (gastroesophageal reflux disease) 12/25/2016   Gluteal tendonitis of right buttock 10/19/2017   Injected in October 19, 2017 Injected October 07, 2018   Greater trochanteric bursitis of left hip 06/17/2016   Greater trochanteric bursitis of right hip 11/27/2015   Injected 11/27/2015    History of recurrent UTIs    [name]   HTN (hypertension)    Hx of osteopenia 10/11/2012   [facility] DEXA;  ordered by [name] Lowest T score: -2.1 @ lumbar spine @ [facility] 11/01/12; decrease of 8.5% vs 05/2009. There is 9.2% risk over 10 yrs History fracture left elbow @ 4 Fractured left wrist , right elbow and nose post fall July/18/2013. Dr. [name] FH Osteoporosis [name] PMH of bisphosphonate therapy (generic Fosamax Rxed but not filled  due to polypharmacy )    Hyperlipidemia    Hyperlipidemia associated with type 2 diabetes mellitus ([place]) 01/18/2007   Qualifier: Diagnosis of  By: [name]  With DM LDL goal = < 100, ideally < 70. Mi in father @ 54; 2 brothers @ 85 & 32     Lumbar radiculopathy 04/11/2008   Qualifier: Diagnosis of  By: [name] MD, Erick Blinks well to epidural 01/12/2017  repeat epidural given May 2018   Migraine headache    quiescent   Nonallopathic lesion of cervical region 07/19/2014   Nonallopathic lesion of lumbosacral region 01/15/2016   Nonallopathic lesion of sacral region 06/27/2018   Nonallopathic lesion-rib cage 09/26/2014   Palpitations 04/15/2011   Rotator cuff impingement syndrome of left shoulder 05/11/2014   Seasonal allergic rhinitis 03/21/2012   Sinusitis, chronic 04/20/2016   Sleep disorder    Dr Dohmier   Subluxation of peroneal tendon of right foot 11/10/2017   TIA (transient ischemic attack)    [place], HX OF 02/02/2008   Qualifier: Diagnosis of  By: Donalee Citrin ADVRS EFF UNS RX MEDICINAL&BIOLOGICAL [place] 02/02/2008   Qualifier: Diagnosis of  By: [name] MD, Posey Pronto AND MYOSITIS 02/02/2008   Qualifier: Diagnosis of  By: [name] MD, [name]     UTI (urinary tract infection) 10/05/2014   Viral upper respiratory tract infection with cough 12/31/2014   Vitamin D deficiency 05/25/2008   Qualifier: Diagnosis of  By: [name] MD, [name]     Past Surgical History:  Procedure Laterality Date   COLONOSCOPY     Dr Carlean [name]; hemorrhoids   CORONARY ANGIOPLASTY  1997   for chest pain- negative    POLYPECTOMY   2002   benign, hyperplastic polyp; rectal bleeding 2008   TONSILLECTOMY     TOTAL ABDOMINAL HYSTERECTOMY  1983   BSO for endometriosis   WISDOM TOOTH EXTRACTION     Social History   Socioeconomic History   Marital status: Married    Spouse name: Not on file   Number of children: Not on file   Years of education: Not on file   Highest education level: Not on file  Occupational History   Occupation: Research scientist (physical sciences)     Employer: [employer]  Tobacco Use   Smoking status: Never   Smokeless tobacco: Never  Vaping Use   Vaping Use: Never used  Substance and Sexual Activity   Alcohol use: [name]   Drug use: [name]   Sexual activity: Not on file  Other Topics Concern   Not on file  Social History  Narrative   Regular exercise- [name]    Social Determinants of Health   Financial Resource Strain: Low Risk    Difficulty of Paying Living Expenses: Not hard at all  Food Insecurity: [name] Food Insecurity   Worried About Charity fundraiser in the Last Year: Never true   Ran Out of Food in the Last Year: Never true  Transportation Needs: [name] Transportation Needs   Lack of Transportation (Medical): [name]   Lack of Transportation (Non-Medical): [name]  Physical Activity: Inactive   Days of Exercise per Week: 0 days   Minutes of Exercise per Session: 0 min  Stress: [name] Stress Concern Present   Feeling of Stress : Not at all  Social Connections: Socially Integrated   Frequency of Communication with Friends and Family: More than three times a week   Frequency of Social Gatherings with Friends and Family: More than three times a week   Attends Religious Services: More than 4 times per year   Active Member of [employer] or Organizations: Yes   Attends Music therapist: More than 4 times per year   Marital Status: Married   Allergies  Allergen Reactions   Vioxx [Rofecoxib] Other (See Comments)    Excess BP which caused TIA    Prednisone Other (See Comments)    Mental status  changes with high-dose oral agent. Tolerates epidural steroid injections. [name] associated rash or fever   Macrodantin [Nitrofurantoin Macrocrystal] Hives   Sulfa Antibiotics     Hives   Colesevelam Other (See Comments)    Leg Pain   Family History  Problem Relation Age of Onset   Colon cancer Maternal Aunt    Diabetes Paternal Uncle    Heart attack Father 27   COPD Mother    Lung cancer Brother        smoker   Mental illness Other        niece, committed suicide.   Heart attack Other        brother X 2; @ 29 & 29   Stroke Neg Hx     Current Outpatient Medications (Endocrine & Metabolic):    Insulin Glargine (BASAGLAR KWIKPEN) 100 UNIT/ML, Inject 20 Units into the skin every morning.   Semaglutide,0.25 or 0.'5MG'$ /DOS, (OZEMPIC, 0.25 OR 0.5 MG/DOSE,) 2 MG/1.5ML SOPN, Inject 0.5 mg into the skin once a week. (Patient taking differently: Inject 0.5 mg into the skin once a week. Tuesday)  Current Outpatient Medications (Cardiovascular):    atorvastatin (LIPITOR) 40 MG tablet, Take 1 tablet (40 mg total) by mouth daily.   losartan (COZAAR) 100 MG tablet, Take 1 tablet (100 mg total) by mouth daily.  Current Outpatient Medications (Respiratory):    promethazine (PHENERGAN) 12.5 MG tablet, Take 1 tablet (12.5 mg total) by mouth every 8 (eight) hours as needed for nausea or vomiting.  Current Outpatient Medications (Analgesics):    acetaminophen (TYLENOL) 500 MG tablet, Take 500 mg by mouth as needed.   aspirin 81 MG tablet, Take 81 mg by mouth daily.   Current Outpatient Medications (Other):    BD PEN NEEDLE NANO 2ND GEN 32G X 4 MM MISC, USE ONCE A DAY AS DIRECTED   busPIRone (BUSPAR) 15 MG tablet, Take 15 mg by mouth 3 (three) times daily.   DULoxetine (CYMBALTA) 60 MG capsule, Take 60 mg by mouth daily.   Lancets (ONETOUCH ULTRASOFT) lancets, Check blood sugar once daily. Dx code: 250.00   LORazepam (ATIVAN) 0.5 MG tablet, Take 0.5 mg by mouth as  needed for anxiety.   omeprazole  (PRILOSEC) 20 MG capsule, Take 1 capsule (20 mg total) by mouth daily.   ONETOUCH ULTRA test strip, USE 1 STRIP TWICE DAILY DX CODE E11.65   tiZANidine (ZANAFLEX) 2 MG tablet, Take 1 tablet (2 mg total) by mouth at bedtime.   traZODone (DESYREL) 50 MG tablet, TAKE 1/2 TO 1 TABLET BY MOUTH AT BEDTIME AS NEEDED FOR SLEEP (Patient taking differently: Take 50 mg by mouth as needed.)   Vitamin D, Ergocalciferol, (DRISDOL) 1.25 MG (50000 UNIT) CAPS capsule, TAKE 1 CAPSULE BY MOUTH ONE TIME PER WEEK (Patient taking differently: Take 50,000 Units by mouth every 7 (seven) days. Sunday)   Reviewed prior external information including notes and imaging from  primary care provider As well as notes that were available from care everywhere and other healthcare systems.  Past medical history, social, surgical and family history all reviewed in electronic medical record.  [name] pertanent information unless stated regarding to the chief complaint.   Review of Systems:  [name]  visual changes, nausea, vomiting, diarrhea, constipation,  abdominal pain, skin rash, fevers, chills, night sweats, weight loss, swollen lymph nodes, body aches, joint swelling, chest pain, shortness of breath, mood changes. POSITIVE muscle aches, dizziness, tachycardia, headache  Objective  Blood pressure 108/82, pulse 84, height '5\' 6"'$  (1.676 m), weight 155 lb (70.3 kg), SpO2 98 %.   General: [name] apparent distress alert and oriented x3 mood and affect normal, dressed appropriately.  HEENT: Pupils equal, extraocular movements intact  Respiratory: Patient's speak in full sentences and does not appear short of breath  Cardiovascular: [name] lower extremity edema, non tender, [name] erythema  Gait normal with good balance and coordination.  MSK:   Neck exam shows the patient does have some mild loss of lordosis.  Tightness noted in the occipital region bilaterally.  Patient is some pain over the left side of the neck.  Mild limited sidebending  bilaterally and lacks 5 degrees of extension.  Left knee exam shows tenderness over the medial joint line.  Positive McMurray's noted.  Patient lacks the last 2 degrees of extension in the last 5 degrees of flexion.  Good stability of the knee with varus and valgus forces.  Limited muscular skeletal ultrasound was performed and interpreted by Hulan Saas, M  Limited musculoskeletal ultrasound shows the patient does have mild displacement of the medial meniscus with what appears to be an acute on chronic tear with hypoechoic changes noted. Impression: acute on chronic meniscus tear   Osteopathic findings C2 flexed rotated and side bent left  C4 flexed rotated and side bent left C7 flexed rotated and side bent left T3 extended rotated and side bent right inhaled third rib T9 extended rotated and side bent left L2 flexed rotated and side bent right Sacrum right on right    Impression and Recommendations:     The above documentation has been reviewed and is accurate and complete Lyndal Pulley, DO

## 2021-09-02 ENCOUNTER — Ambulatory Visit (INDEPENDENT_AMBULATORY_CARE_PROVIDER_SITE_OTHER): Payer: Medicare Other | Admitting: Family Medicine

## 2021-09-02 ENCOUNTER — Ambulatory Visit (INDEPENDENT_AMBULATORY_CARE_PROVIDER_SITE_OTHER): Payer: Medicare Other

## 2021-09-02 ENCOUNTER — Ambulatory Visit: Payer: Self-pay

## 2021-09-02 ENCOUNTER — Other Ambulatory Visit: Payer: Self-pay

## 2021-09-02 ENCOUNTER — Encounter: Payer: Self-pay | Admitting: Family Medicine

## 2021-09-02 VITALS — BP 108/82 | HR 84 | Ht 66.0 in | Wt 155.0 lb

## 2021-09-02 DIAGNOSIS — M5010 Cervical disc disorder with radiculopathy, unspecified cervical region: Secondary | ICD-10-CM | POA: Diagnosis not present

## 2021-09-02 DIAGNOSIS — R000 Tachycardia, unspecified: Secondary | ICD-10-CM

## 2021-09-02 DIAGNOSIS — R42 Dizziness and giddiness: Secondary | ICD-10-CM

## 2021-09-02 DIAGNOSIS — M25562 Pain in left knee: Secondary | ICD-10-CM | POA: Diagnosis not present

## 2021-09-02 DIAGNOSIS — M9901 Segmental and somatic dysfunction of cervical region: Secondary | ICD-10-CM

## 2021-09-02 DIAGNOSIS — M9908 Segmental and somatic dysfunction of rib cage: Secondary | ICD-10-CM

## 2021-09-02 DIAGNOSIS — M9904 Segmental and somatic dysfunction of sacral region: Secondary | ICD-10-CM | POA: Diagnosis not present

## 2021-09-02 DIAGNOSIS — M9903 Segmental and somatic dysfunction of lumbar region: Secondary | ICD-10-CM

## 2021-09-02 DIAGNOSIS — M9902 Segmental and somatic dysfunction of thoracic region: Secondary | ICD-10-CM

## 2021-09-02 DIAGNOSIS — S83242A Other tear of medial meniscus, current injury, left knee, initial encounter: Secondary | ICD-10-CM

## 2021-09-02 NOTE — Assessment & Plan Note (Signed)
Chronic problem noted.  Patient is mild exacerbation.  Has not been able to be as active with patient's other symptomatology recently.  Discussed with patient about icing regimen and home exercises, increase activity slowly.  Follow-up with me again in 6 to 8 weeks.

## 2021-09-02 NOTE — Assessment & Plan Note (Signed)
Patient did have unfortunately sepsis as well as bacteremia.  Patient was in the hospital and since then has not been feeling like herself.  Patient also notices the possibility of an association since she has started the [place].  Patient has lost a good amount of weight with the medication but states that she has not felt like herself.  Patient is continuing to work with her primary care physician as well as endocrinologist.  Secondary to the history of of the tachycardia with some dizziness I would like patient to be further evaluated with cardiology as well.  Patient has had a stress test done greater than a year and a half ago.  Recently did have a 7-day monitor placed but this was before her fairly recent hospitalization.  Patient will follow up with me again for her other ailments in 4 to 6 weeks otherwise.  Patient knows if worsening symptoms to seek medical attention or go to the emergency room immediately.

## 2021-09-02 NOTE — Assessment & Plan Note (Signed)
Patient does have what appears to be an acute on chronic tear of the medial meniscus with mild displacement of somewhere between 10 and 25%.  Discussed with patient to avoid twisting motions, icing regimen, hold on any injection at this time.  Patient given home exercises and work with Product/process development scientist.  Follow-up again in 4 to 6 weeks and if worsening pain consider injection as well as physical therapy

## 2021-09-02 NOTE — Patient Instructions (Addendum)
Xray today Ice 20 min  Avoid twisting Dr. Daneen Schick will call you See me again in 4-5 weeks for the knee

## 2021-09-04 ENCOUNTER — Encounter: Payer: Self-pay | Admitting: Family Medicine

## 2021-09-04 ENCOUNTER — Other Ambulatory Visit: Payer: Self-pay | Admitting: Family Medicine

## 2021-09-10 DIAGNOSIS — F339 Major depressive disorder, recurrent, unspecified: Secondary | ICD-10-CM | POA: Diagnosis not present

## 2021-09-10 DIAGNOSIS — F410 Panic disorder [episodic paroxysmal anxiety] without agoraphobia: Secondary | ICD-10-CM | POA: Diagnosis not present

## 2021-09-17 ENCOUNTER — Telehealth: Payer: Self-pay | Admitting: Family Medicine

## 2021-09-17 NOTE — Telephone Encounter (Signed)
Patient called stating that she is still having a lot of pain in her knee. She has been trying to do the exercises and ice as discussed but feels like it is getting worse.  She asked if it would be best for her to use a brace or what Dr Tamala Julian would recommend?  Does she need a follow up appointment?  (I have a spot blocked her her if needed)

## 2021-09-17 NOTE — Telephone Encounter (Signed)
Appointment scheduled.

## 2021-09-18 ENCOUNTER — Other Ambulatory Visit: Payer: Self-pay

## 2021-09-18 ENCOUNTER — Encounter: Payer: Self-pay | Admitting: Family Medicine

## 2021-09-18 ENCOUNTER — Ambulatory Visit (INDEPENDENT_AMBULATORY_CARE_PROVIDER_SITE_OTHER): Payer: Medicare Other | Admitting: Family Medicine

## 2021-09-18 ENCOUNTER — Ambulatory Visit: Payer: Self-pay

## 2021-09-18 VITALS — BP 160/90 | HR 84 | Wt 156.0 lb

## 2021-09-18 DIAGNOSIS — S83242A Other tear of medial meniscus, current injury, left knee, initial encounter: Secondary | ICD-10-CM | POA: Diagnosis not present

## 2021-09-18 NOTE — Progress Notes (Signed)
[place] Phone: [phone] Subjective:    I'm seeing this patient by the request  of:  [name], MD  CC: Left knee pain  NIO:[id number]  09/02/2021 Patient does have what appears to be an acute on chronic tear of the medial meniscus with mild displacement of somewhere between 10 and 25%.  Discussed with patient to avoid twisting motions, icing regimen, hold on any injection at this time.  Patient given home exercises and work with Product/process development scientist.  Follow-up again in 4 to 6 weeks and if worsening pain consider injection as well as physical therapy Chronic problem noted.  Patient is mild exacerbation.  Has not been able to be as active with patient's other symptomatology recently.  Discussed with patient about icing regimen and home exercises, increase activity slowly.  Follow-up with me again in 6 to 8 weeks. Patient did have unfortunately sepsis as well as bacteremia.  Patient was in the hospital and since then has not been feeling like herself.  Patient also notices the possibility of an association since she has started the [place].  Patient has lost a good amount of weight with the medication but states that she has not felt like herself.  Patient is continuing to work with her primary care physician as well as endocrinologist.  Secondary to the history of of the tachycardia with some dizziness I would like patient to be further evaluated with cardiology as well.  Patient has had a stress test done greater than a year and a half ago.  Recently did have a 7-day monitor placed but this was before her fairly recent hospitalization.  Patient will follow up with me again for her other ailments in 4 to 6 weeks otherwise.  Patient knows if worsening symptoms to seek medical attention or go to the emergency room immediately.  Updated 09/18/2021 Christine [name] is a 70 y.o. female coming in with complaint of left  knee pain. Last week everything was fine. Was doing exercises intermittently. This week certain motions cause debilitating pain for a few minutes, then the pain dissipates.  Patient states that she is concerned that this will lock on her 1 time and she will not be able to move it.  Patient does not know exactly what she is doing that is contributing to the discomfort and pain all the time but does notice that it is twisting motions that do seem to be worse.       Past Medical History:  Diagnosis Date   Acute bursitis of right shoulder 08/29/2018   Right shoulder injected in August 29, 2018   Allergic rhinitis    Closed displaced fracture of fifth metatarsal bone of left foot 03/16/2017   DEPRESSIVE DISORDER NOT ELSEWHERE CLASSIFIED 03/27/2010   Qualifier: Diagnosis of  By: [name] MD, [name]   [name], NP , [facility] [place]     Diabetes ([place]) 04/02/2016   Diabetes mellitus    Essential hypertension 03/14/2008   Qualifier: Diagnosis of  By: [name] MD, [name]     Fibromyalgia 08/12/2011   Diagnosed by [name] , Rheumatologist 2010    GERD (gastroesophageal reflux disease) 12/25/2016   Gluteal tendonitis of right buttock 10/19/2017   Injected in October 19, 2017 Injected October 07, 2018   Greater trochanteric bursitis of left hip 06/17/2016   Greater trochanteric bursitis of right hip 11/27/2015   Injected 11/27/2015    History  of recurrent UTIs    [name]   HTN (hypertension)    Hx of osteopenia 10/11/2012   [facility]; ordered by [name] Lowest T score: -2.1 @ lumbar spine @ [facility] 11/01/12; decrease of 8.5% vs 05/2009. There is 9.2% risk over 10 yrs History fracture left elbow @ 4 Fractured left wrist , right elbow and nose post fall July/18/2013. Dr. [name] No FH Osteoporosis No PMH of bisphosphonate therapy (generic Fosamax Rxed but not filled  due to polypharmacy )    Hyperlipidemia    Hyperlipidemia associated with type 2 diabetes mellitus ([place])  01/18/2007   Qualifier: Diagnosis of  By: [name]  With DM LDL goal = < 100, ideally < 70. Mi in father @ 33; 2 brothers @ 66 & 36     Lumbar radiculopathy 04/11/2008   Qualifier: Diagnosis of  By: [name] MD, Erick Blinks well to epidural 01/12/2017  repeat epidural given May 2018   Migraine headache    quiescent   Nonallopathic lesion of cervical region 07/19/2014   Nonallopathic lesion of lumbosacral region 01/15/2016   Nonallopathic lesion of sacral region 06/27/2018   Nonallopathic lesion-rib cage 09/26/2014   Palpitations 04/15/2011   Rotator cuff impingement syndrome of left shoulder 05/11/2014   Seasonal allergic rhinitis 03/21/2012   Sinusitis, chronic 04/20/2016   Sleep disorder    Dr Dohmier   Subluxation of peroneal tendon of right foot 11/10/2017   TIA (transient ischemic attack)    [place], HX OF 02/02/2008   Qualifier: Diagnosis of  By: Donalee Citrin ADVRS EFF UNS RX MEDICINAL&BIOLOGICAL [place] 02/02/2008   Qualifier: Diagnosis of  By: [name] MD, Posey Pronto AND MYOSITIS 02/02/2008   Qualifier: Diagnosis of  By: [name] MD, [name]     UTI (urinary tract infection) 10/05/2014   Viral upper respiratory tract infection with cough 12/31/2014   Vitamin D deficiency 05/25/2008   Qualifier: Diagnosis of  By: [name] MD, [name]     Past Surgical History:  Procedure Laterality Date   COLONOSCOPY     Dr Carlean [name]; hemorrhoids   CORONARY ANGIOPLASTY  1997   for chest pain- negative    POLYPECTOMY  2002   benign, hyperplastic polyp; rectal bleeding 2008   TONSILLECTOMY     TOTAL ABDOMINAL HYSTERECTOMY  1983   BSO for endometriosis   WISDOM TOOTH EXTRACTION     Social History   Socioeconomic History   Marital status: Married    Spouse name: Not on file   Number of children: Not on file   Years of education: Not on file   Highest education level: Not on file  Occupational History   Occupation: Research scientist (physical sciences)      Employer: [employer]  Tobacco Use   Smoking status: Never   Smokeless tobacco: Never  Vaping Use   Vaping Use: Never used  Substance and Sexual Activity   Alcohol use: No   Drug use: No   Sexual activity: Not on file  Other Topics Concern   Not on file  Social History Narrative   Regular exercise- no    Social Determinants of Health   Financial Resource Strain: Low Risk    Difficulty of Paying Living Expenses: Not hard at all  Food Insecurity: No Food Insecurity   Worried About Charity fundraiser in the Last Year: Never true   [place] in the Last Year: Never true  Transportation Needs: No Data processing manager (Medical): No   Lack of Transportation (Non-Medical): No  Physical Activity: Inactive   Days of Exercise per Week: 0 days   Minutes of Exercise per Session: 0 min  Stress: No Stress Concern Present   Feeling of Stress : Not at all  Social Connections: Socially Integrated   Frequency of Communication with Friends and Family: More than three times a week   Frequency of Social Gatherings with Friends and Family: More than three times a week   Attends Religious Services: More than 4 times per year   Active Member of [employer] or Organizations: Yes   Attends Music therapist: More than 4 times per year   Marital Status: Married   Allergies  Allergen Reactions   Vioxx [Rofecoxib] Other (See Comments)    Excess BP which caused TIA    Prednisone Other (See Comments)    Mental status changes with high-dose oral agent. Tolerates epidural steroid injections. No associated rash or fever   Macrodantin [Nitrofurantoin Macrocrystal] Hives   Sulfa Antibiotics     Hives   Colesevelam Other (See Comments)    Leg Pain   Family History  Problem Relation Age of Onset   Colon cancer Maternal Aunt    Diabetes Paternal Uncle    Heart attack Father 82   COPD Mother    Lung cancer Brother        smoker   Mental illness Other         niece, committed suicide.   Heart attack Other        brother X 2; @ 24 & 67   Stroke Neg Hx     Current Outpatient Medications (Endocrine & Metabolic):    Insulin Glargine (BASAGLAR KWIKPEN) 100 UNIT/ML, Inject 20 Units into the skin every morning.   Semaglutide,0.25 or 0.5MG /DOS, (OZEMPIC, 0.25 OR 0.5 MG/DOSE,) 2 MG/1.5ML SOPN, Inject 0.5 mg into the skin once a week. (Patient taking differently: Inject 0.5 mg into the skin once a week. Tuesday)  Current Outpatient Medications (Cardiovascular):    atorvastatin (LIPITOR) 40 MG tablet, Take 1 tablet (40 mg total) by mouth daily.   losartan (COZAAR) 100 MG tablet, Take 1 tablet (100 mg total) by mouth daily.  Current Outpatient Medications (Respiratory):    promethazine (PHENERGAN) 12.5 MG tablet, Take 1 tablet (12.5 mg total) by mouth every 8 (eight) hours as needed for nausea or vomiting.  Current Outpatient Medications (Analgesics):    acetaminophen (TYLENOL) 500 MG tablet, Take 500 mg by mouth as needed.   aspirin 81 MG tablet, Take 81 mg by mouth daily.   Current Outpatient Medications (Other):    BD PEN NEEDLE NANO 2ND GEN 32G X 4 MM MISC, USE ONCE A DAY AS DIRECTED   busPIRone (BUSPAR) 15 MG tablet, Take 15 mg by mouth 3 (three) times daily.   DULoxetine (CYMBALTA) 60 MG capsule, Take 60 mg by mouth daily.   Lancets (ONETOUCH ULTRASOFT) lancets, Check blood sugar once daily. Dx code: 250.00   LORazepam (ATIVAN) 0.5 MG tablet, Take 0.5 mg by mouth as needed for anxiety.   omeprazole (PRILOSEC) 20 MG capsule, Take 1 capsule (20 mg total) by mouth daily.   ONETOUCH ULTRA test strip, USE 1 STRIP TWICE DAILY DX CODE E11.65   tiZANidine (ZANAFLEX) 2 MG tablet, Take 1 tablet (2 mg total) by mouth at bedtime.   traZODone (DESYREL) 50 MG tablet, TAKE 1/2 TO 1 TABLET BY MOUTH AT  BEDTIME AS NEEDED FOR SLEEP (Patient taking differently: Take 50 mg by mouth as needed.)   Vitamin D, Ergocalciferol, (DRISDOL) 1.25 MG (50000 UNIT) CAPS  capsule, TAKE 1 CAPSULE BY MOUTH ONE TIME PER WEEK   Reviewed prior external information including notes and imaging from  primary care provider As well as notes that were available from care everywhere and other healthcare systems.  Past medical history, social, surgical and family history all reviewed in electronic medical record.  No pertanent information unless stated regarding to the chief complaint.   Review of Systems:  No headache, visual changes, nausea, vomiting, diarrhea, constipation, dizziness, abdominal pain, skin rash, fevers, chills, night sweats, weight loss, swollen lymph nodes, body aches, joint swelling, chest pain, shortness of breath, mood changes. POSITIVE muscle aches  Objective  Blood pressure (!) 160/90, pulse 84, weight 156 lb (70.8 kg), SpO2 96 %.   General: No apparent distress alert and oriented x3 mood and affect normal, dressed appropriately.  HEENT: Pupils equal, extraocular movements intact  Respiratory: Patient's speak in full sentences and does not appear short of breath  Cardiovascular: No lower extremity edema, non tender, no erythema  Gait normal with good balance and coordination.  MSK: Patient's left knee still tender to palpation over the medial aspect.  Positive McMurray's noted.  Does have fullness of the popliteal area noted.  Limited muscular skeletal ultrasound was performed and interpreted by Hulan Saas, M  Limited ultrasound shows the medial meniscus tear noted.  Still some mild displacement noted.  Patient noted does have a new Baker's cyst and appears of the popliteal area.   Impression: Meniscal tear with Baker's cyst    Impression and Recommendations:     The above documentation has been reviewed and is accurate and complete Lyndal Pulley, DO

## 2021-09-18 NOTE — Patient Instructions (Signed)
Get a compression sleeve wear daily, but not to sleep Ice regularly Avoid twisting motions Keep next appointment, but if worsen give me a call

## 2021-09-18 NOTE — Assessment & Plan Note (Signed)
No significant increase in displacement noted.  Patient does have a Baker's cyst noted.  Likely more contributing to the discomfort at the time.  We discussed the possibility of aspiration.  Patient wants to hold at the moment.  Discussed icing regimen and home exercises.  Discussed that compression could be beneficial.  Patient will try this and follow-up with me again in 2 to 3 weeks.  If no significant improvement will do aspiration at that time.

## 2021-09-23 ENCOUNTER — Other Ambulatory Visit: Payer: Self-pay

## 2021-09-23 ENCOUNTER — Ambulatory Visit (INDEPENDENT_AMBULATORY_CARE_PROVIDER_SITE_OTHER): Payer: Medicare Other | Admitting: Endocrinology

## 2021-09-23 VITALS — BP 128/70 | HR 103 | Ht 66.0 in | Wt 153.8 lb

## 2021-09-23 DIAGNOSIS — E119 Type 2 diabetes mellitus without complications: Secondary | ICD-10-CM | POA: Diagnosis not present

## 2021-09-23 DIAGNOSIS — E1151 Type 2 diabetes mellitus with diabetic peripheral angiopathy without gangrene: Secondary | ICD-10-CM | POA: Diagnosis not present

## 2021-09-23 DIAGNOSIS — Z794 Long term (current) use of insulin: Secondary | ICD-10-CM | POA: Diagnosis not present

## 2021-09-23 LAB — POCT GLYCOSYLATED HEMOGLOBIN (HGB A1C): Hemoglobin A1C: 6.3 % — AB (ref 4.0–5.6)

## 2021-09-23 MED ORDER — BASAGLAR KWIKPEN 100 UNIT/ML ~~LOC~~ SOPN: 10.0000 [IU] | PEN_INJECTOR | SUBCUTANEOUS | 3 refills | Status: DC

## 2021-09-23 NOTE — Patient Instructions (Signed)
check your blood sugar twice a day.  vary the time of day when you check, between before the 3 meals, and at bedtime.  also check if you have symptoms of your blood sugar being too high or too low.  please keep a record of the readings and bring it to your next appointment here.  You can write it on any piece of paper.  please call us sooner if your blood sugar goes below 70, or if you have a lot of readings over 200.   Please reduce the basaglar to 10 units each morning, and continue the same Ozempic.   Please come back for a follow-up appointment in 2 months.

## 2021-09-23 NOTE — Progress Notes (Signed)
Subjective:    Patient ID: Christine [name], female    DOB: June 24, 1951, 70 y.o.   MRN: [id number]  HPI Pt returns for f/u of diabetes mellitus:  DM type: Insulin-requiring type 2 Dx'ed: 9381 Complications: TIA Therapy: insulin since 2014, and Ozempic.   GDM: never.   DKA: never.   Severe hypoglycemia: never.  Pancreatitis: never.   Other: She declines multiple daily injections; pioglitizone was for NASH, but she stopped due to edema; a trial to convert back to oral rx in 2017 was unsuccessful; she declines additional DM rx.   Interval history: she brings a record of her cbg's which I have reviewed today.  Cbg varies from 77-115.  She takes meds as rx'ed.  She continues to lose weight.   Past Medical History:  Diagnosis Date   Acute bursitis of right shoulder 08/29/2018   Right shoulder injected in August 29, 2018   Allergic rhinitis    Closed displaced fracture of fifth metatarsal bone of left foot 03/16/2017   DEPRESSIVE DISORDER NOT ELSEWHERE CLASSIFIED 03/27/2010   Qualifier: Diagnosis of  By: [name] MD, [name]   [name], NP , [facility] [place]     Diabetes ([place]) 04/02/2016   Diabetes mellitus    Essential hypertension 03/14/2008   Qualifier: Diagnosis of  By: [name] MD, [name]     Fibromyalgia 08/12/2011   Diagnosed by [name] , Rheumatologist 2010    GERD (gastroesophageal reflux disease) 12/25/2016   Gluteal tendonitis of right buttock 10/19/2017   Injected in October 19, 2017 Injected October 07, 2018   Greater trochanteric bursitis of left hip 06/17/2016   Greater trochanteric bursitis of right hip 11/27/2015   Injected 11/27/2015    History of recurrent UTIs    [name]   HTN (hypertension)    Hx of osteopenia 10/11/2012   [facility]; ordered by [name] Lowest T score: -2.1 @ lumbar spine @ [facility] 11/01/12; decrease of 8.5% vs 05/2009. There is 9.2% risk over 10 yrs History fracture left elbow @ 6 Fractured left wrist , right elbow  and nose post fall July/18/2013. Dr. [name] No FH Osteoporosis No PMH of bisphosphonate therapy (generic Fosamax Rxed but not filled  due to polypharmacy )    Hyperlipidemia    Hyperlipidemia associated with type 2 diabetes mellitus ([place]) 01/18/2007   Qualifier: Diagnosis of  By: [name]  With DM LDL goal = < 100, ideally < 70. Mi in father @ 74; 2 brothers @ 6 & 34     Lumbar radiculopathy 04/11/2008   Qualifier: Diagnosis of  By: [name] MD, Erick Blinks well to epidural 01/12/2017  repeat epidural given May 2018   Migraine headache    quiescent   Nonallopathic lesion of cervical region 07/19/2014   Nonallopathic lesion of lumbosacral region 01/15/2016   Nonallopathic lesion of sacral region 06/27/2018   Nonallopathic lesion-rib cage 09/26/2014   Palpitations 04/15/2011   Rotator cuff impingement syndrome of left shoulder 05/11/2014   Seasonal allergic rhinitis 03/21/2012   Sinusitis, chronic 04/20/2016   Sleep disorder    Dr Dohmier   Subluxation of peroneal tendon of right foot 11/10/2017   TIA (transient ischemic attack)    [place], HX OF 02/02/2008   Qualifier: Diagnosis of  By: Donalee Citrin ADVRS EFF UNS RX MEDICINAL&BIOLOGICAL [place] 02/02/2008   Qualifier: Diagnosis of  By: [name] MD, Posey Pronto AND  MYOSITIS 02/02/2008   Qualifier: Diagnosis of  By: [name] MD, [name]     UTI (urinary tract infection) 10/05/2014   Viral upper respiratory tract infection with cough 12/31/2014   Vitamin D deficiency 05/25/2008   Qualifier: Diagnosis of  By: [name] MD, [name]      Past Surgical History:  Procedure Laterality Date   COLONOSCOPY     Dr Carlean [name]; hemorrhoids   CORONARY ANGIOPLASTY  1997   for chest pain- negative    POLYPECTOMY  2002   benign, hyperplastic polyp; rectal bleeding 2008   TONSILLECTOMY     TOTAL ABDOMINAL HYSTERECTOMY  1983   BSO for endometriosis   WISDOM TOOTH EXTRACTION      Social History    Socioeconomic History   Marital status: Married    Spouse name: Not on file   Number of children: Not on file   Years of education: Not on file   Highest education level: Not on file  Occupational History   Occupation: Research scientist (physical sciences)     Employer: [employer]  Tobacco Use   Smoking status: Never   Smokeless tobacco: Never  Vaping Use   Vaping Use: Never used  Substance and Sexual Activity   Alcohol use: No   Drug use: No   Sexual activity: Not on file  Other Topics Concern   Not on file  Social History Narrative   Regular exercise- no    Social Determinants of Health   Financial Resource Strain: Low Risk    Difficulty of Paying Living Expenses: Not hard at all  Food Insecurity: No Food Insecurity   Worried About Charity fundraiser in the Last Year: Never true   [place] in the Last Year: Never true  Transportation Needs: No Transportation Needs   Lack of Transportation (Medical): No   Lack of Transportation (Non-Medical): No  Physical Activity: Inactive   Days of Exercise per Week: 0 days   Minutes of Exercise per Session: 0 min  Stress: No Stress Concern Present   Feeling of Stress : Not at all  Social Connections: Socially Integrated   Frequency of Communication with Friends and Family: More than three times a week   Frequency of Social Gatherings with Friends and Family: More than three times a week   Attends Religious Services: More than 4 times per year   Active Member of [employer] or Organizations: Yes   Attends Music therapist: More than 4 times per year   Marital Status: Married  Human resources officer Violence: Not At Risk   Fear of Current or Ex-Partner: No   Emotionally Abused: No   Physically Abused: No   Sexually Abused: No    Current Outpatient Medications on File Prior to Visit  Medication Sig Dispense Refill   acetaminophen (TYLENOL) 500 MG tablet Take 500 mg by mouth as needed.     aspirin 81 MG tablet Take 81  mg by mouth daily.     atorvastatin (LIPITOR) 40 MG tablet Take 1 tablet (40 mg total) by mouth daily. 90 tablet 3   BD PEN NEEDLE NANO 2ND GEN 32G X 4 MM MISC USE ONCE A DAY AS DIRECTED 100 each 3   busPIRone (BUSPAR) 15 MG tablet Take 15 mg by mouth 3 (three) times daily.     DULoxetine (CYMBALTA) 60 MG capsule Take 60 mg by mouth daily.     Lancets (ONETOUCH ULTRASOFT) lancets Check blood sugar once daily. Dx code: 250.00 100 each  12   LORazepam (ATIVAN) 0.5 MG tablet Take 0.5 mg by mouth as needed for anxiety.     losartan (COZAAR) 100 MG tablet Take 1 tablet (100 mg total) by mouth daily. 90 tablet 3   omeprazole (PRILOSEC) 20 MG capsule Take 1 capsule (20 mg total) by mouth daily.     ONETOUCH ULTRA test strip USE 1 STRIP TWICE DAILY DX CODE E11.65 100 strip 3   promethazine (PHENERGAN) 12.5 MG tablet Take 1 tablet (12.5 mg total) by mouth every 8 (eight) hours as needed for nausea or vomiting. 20 tablet 8   Semaglutide,0.25 or 0.5MG /DOS, (OZEMPIC, 0.25 OR 0.5 MG/DOSE,) 2 MG/1.5ML SOPN Inject 0.5 mg into the skin once a week. (Patient taking differently: Inject 0.5 mg into the skin once a week. Tuesday) 4.5 mL 3   tiZANidine (ZANAFLEX) 2 MG tablet Take 1 tablet (2 mg total) by mouth at bedtime. 90 tablet 1   traZODone (DESYREL) 50 MG tablet TAKE 1/2 TO 1 TABLET BY MOUTH AT BEDTIME AS NEEDED FOR SLEEP (Patient taking differently: Take 50 mg by mouth as needed.) 90 tablet 1   Vitamin D, Ergocalciferol, (DRISDOL) 1.25 MG (50000 UNIT) CAPS capsule TAKE 1 CAPSULE BY MOUTH ONE TIME PER WEEK 12 capsule 0   No current facility-administered medications on file prior to visit.    Allergies  Allergen Reactions   Vioxx [Rofecoxib] Other (See Comments)    Excess BP which caused TIA    Prednisone Other (See Comments)    Mental status changes with high-dose oral agent. Tolerates epidural steroid injections. No associated rash or fever   Macrodantin [Nitrofurantoin Macrocrystal] Hives   Sulfa  Antibiotics     Hives   Colesevelam Other (See Comments)    Leg Pain    Family History  Problem Relation Age of Onset   Colon cancer Maternal Aunt    Diabetes Paternal Uncle    Heart attack Father 109   COPD Mother    Lung cancer Brother        smoker   Mental illness Other        niece, committed suicide.   Heart attack Other        brother X 2; @ 33 & 52   Stroke Neg Hx     BP 128/70 (BP Location: Right Arm, Patient Position: Sitting, Cuff Size: Normal)   Pulse (!) 103   Ht 5\' 6"  (1.676 m)   Wt 153 lb 12.8 oz (69.8 kg)   LMP  (LMP Unknown)   SpO2 94%   BMI 24.82 kg/m    Review of Systems She has lost 17 lbs since last ov.     Objective:   Physical Exam Pulses: dorsalis pedis intact bilat.   MSK: no deformity of the feet CV: trace bilat leg edema Skin:  no ulcer on the feet.  normal color and temp on the feet. Neuro: sensation is intact to touch on the feet  Lab Results  Component Value Date   CREATININE 0.93 08/19/2021   BUN 17 08/19/2021   NA 137 08/19/2021   K 3.8 08/19/2021   CL 102 08/19/2021   CO2 27 08/19/2021    A1c=6.3%    Assessment & Plan:  Insulin-requiring type 2 DM: overcontrolled.  She may be able to d/c insulin.  Weight loss, due to urosepsis and Ozempic.    Patient Instructions  check your blood sugar twice a day.  vary the time of day when you check, between before the  3 meals, and at bedtime.  also check if you have symptoms of your blood sugar being too high or too low.  please keep a record of the readings and bring it to your next appointment here.  You can write it on any piece of paper.  please call us sooner if your blood sugar goes below 70, or if you have a lot of readings over 200.   Please reduce the basaglar to 10 units each morning, and continue the same Ozempic.   Please come back for a follow-up appointment in 2 months.

## 2021-09-27 NOTE — Progress Notes (Signed)
Cardiology Office Note:    Date:  09/29/2021   ID:  Christine, [name] 03/16/1951, MRN [id number]  PCP:  [name], MD  Cardiologist:  Sinclair Grooms, MD   Referring MD: Lyndal Pulley, DO   Chief Complaint  Patient presents with   Advice Only    Rapid heart rate when standing Weakness Status post sepsis    History of Present Illness:    Christine [name] is a 70 y.o. female with a hx of asymptomatic CAD (non obstructive CAD), DM II, hyperlipidemia, TIA, who has been having rapid HR and palpitations.   Christine [name] had sepsis in late August.  It was related to urinary tract infection.  She was hospitalized for several days.  Since treatment and discharge from the hospital she has felt short of breath, weak, somewhat shaky, has a tired feeling across her shoulders and tingling in her arms when she is up and moving around.  She can feel her heart rate increased significantly.  It does not happen all the time but she has never had an episode that occurs when she is sitting or lying.  When these episodes do occur, she can get it to go away and less than a minute by sitting down or lying down.  She does have a pulse oximeter at home and at times has measured heart rates as fast as 145 bpm.  Past Medical History:  Diagnosis Date   Acute bursitis of right shoulder 08/29/2018   Right shoulder injected in August 29, 2018   Allergic rhinitis    Closed displaced fracture of fifth metatarsal bone of left foot 03/16/2017   DEPRESSIVE DISORDER NOT ELSEWHERE CLASSIFIED 03/27/2010   Qualifier: Diagnosis of  By: [name] MD, [name]   Mr Christine Manila, NP , [facility] [place]     Diabetes ([place]) 04/02/2016   Diabetes mellitus    Essential hypertension 03/14/2008   Qualifier: Diagnosis of  By: [name] MD, [name]     Fibromyalgia 08/12/2011   Diagnosed by [name] , Rheumatologist 2010    GERD (gastroesophageal reflux disease) 12/25/2016   Gluteal  tendonitis of right buttock 10/19/2017   Injected in October 19, 2017 Injected October 07, 2018   Greater trochanteric bursitis of left hip 06/17/2016   Greater trochanteric bursitis of right hip 11/27/2015   Injected 11/27/2015    History of recurrent UTIs    [name]   HTN (hypertension)    Hx of osteopenia 10/11/2012   [facility] DEXA; ordered by [name] Lowest T score: -2.1 @ lumbar spine @ [facility] 11/01/12; decrease of 8.5% vs 05/2009. There is 9.2% risk over 10 yrs History fracture left elbow @ 4 Fractured left wrist , right elbow and nose post fall July/18/2013. Dr. [name] No FH Osteoporosis No PMH of bisphosphonate therapy (generic Fosamax Rxed but not filled  due to polypharmacy )    Hyperlipidemia    Hyperlipidemia associated with type 2 diabetes mellitus ([place]) 01/18/2007   Qualifier: Diagnosis of  By: [name]  With DM LDL goal = < 100, ideally < 70. Mi in father @ 70; 2 brothers @ 55 & 82     Lumbar radiculopathy 04/11/2008   Qualifier: Diagnosis of  By: [name] MD, Erick Blinks well to epidural 01/12/2017  repeat epidural given May 2018   Migraine headache    quiescent   Nonallopathic lesion of cervical region 07/19/2014   Nonallopathic lesion of lumbosacral region 01/15/2016  Nonallopathic lesion of sacral region 06/27/2018   Nonallopathic lesion-rib cage 09/26/2014   Palpitations 04/15/2011   Rotator cuff impingement syndrome of left shoulder 05/11/2014   Seasonal allergic rhinitis 03/21/2012   Sinusitis, chronic 04/20/2016   Sleep disorder    Dr Dohmier   Subluxation of peroneal tendon of right foot 11/10/2017   TIA (transient ischemic attack)    [place], HX OF 02/02/2008   Qualifier: Diagnosis of  By: Donalee Citrin ADVRS EFF UNS RX MEDICINAL&BIOLOGICAL [place] 02/02/2008   Qualifier: Diagnosis of  By: [name] MD, Posey Pronto AND MYOSITIS 02/02/2008   Qualifier: Diagnosis of  By: [name] MD, [name]     UTI (urinary tract  infection) 10/05/2014   Viral upper respiratory tract infection with cough 12/31/2014   Vitamin D deficiency 05/25/2008   Qualifier: Diagnosis of  By: [name] MD, [name]      Past Surgical History:  Procedure Laterality Date   COLONOSCOPY     Dr Carlean [name]; hemorrhoids   CORONARY ANGIOPLASTY  1997   for chest pain- negative    POLYPECTOMY  2002   benign, hyperplastic polyp; rectal bleeding 2008   TONSILLECTOMY     TOTAL ABDOMINAL HYSTERECTOMY  1983   BSO for endometriosis   WISDOM TOOTH EXTRACTION      Current Medications: Current Meds  Medication Sig   acetaminophen (TYLENOL) 500 MG tablet Take 500 mg by mouth as needed.   aspirin 81 MG tablet Take 81 mg by mouth daily.   atorvastatin (LIPITOR) 40 MG tablet Take 1 tablet (40 mg total) by mouth daily.   BD PEN NEEDLE NANO 2ND GEN 32G X 4 MM MISC USE ONCE A DAY AS DIRECTED   busPIRone (BUSPAR) 15 MG tablet Take 15 mg by mouth 3 (three) times daily.   DULoxetine (CYMBALTA) 60 MG capsule Take 60 mg by mouth daily.   Insulin Glargine (BASAGLAR KWIKPEN) 100 UNIT/ML Inject 10 Units into the skin every morning.   Lancets (ONETOUCH ULTRASOFT) lancets Check blood sugar once daily. Dx code: 250.00   LORazepam (ATIVAN) 0.5 MG tablet Take 0.5 mg by mouth as needed for anxiety.   losartan (COZAAR) 50 MG tablet Take 1 tablet (50 mg total) by mouth daily.   omeprazole (PRILOSEC) 20 MG capsule Take 1 capsule (20 mg total) by mouth daily.   ONETOUCH ULTRA test strip USE 1 STRIP TWICE DAILY DX CODE E11.65   promethazine (PHENERGAN) 12.5 MG tablet Take 1 tablet (12.5 mg total) by mouth every 8 (eight) hours as needed for nausea or vomiting.   Semaglutide,0.25 or 0.5MG /DOS, (OZEMPIC, 0.25 OR 0.5 MG/DOSE,) 2 MG/1.5ML SOPN Inject 0.5 mg into the skin once a week. (Patient taking differently: Inject 0.5 mg into the skin once a week. Tuesday)   tiZANidine (ZANAFLEX) 2 MG tablet Take 1 tablet (2 mg total) by mouth at bedtime.   traZODone (DESYREL) 50 MG  tablet TAKE 1/2 TO 1 TABLET BY MOUTH AT BEDTIME AS NEEDED FOR SLEEP (Patient taking differently: Take 50 mg by mouth as needed.)   Vitamin D, Ergocalciferol, (DRISDOL) 1.25 MG (50000 UNIT) CAPS capsule TAKE 1 CAPSULE BY MOUTH ONE TIME PER WEEK   [DISCONTINUED] losartan (COZAAR) 100 MG tablet Take 1 tablet (100 mg total) by mouth daily.     Allergies:   Vioxx [rofecoxib], Prednisone, Macrodantin [nitrofurantoin macrocrystal], Sulfa antibiotics, and Colesevelam   Social History   Socioeconomic History   Marital status: Married  Spouse name: Not on file   Number of children: Not on file   Years of education: Not on file   Highest education level: Not on file  Occupational History   Occupation: Research scientist (physical sciences)     Employer: [employer]  Tobacco Use   Smoking status: Never   Smokeless tobacco: Never  Vaping Use   Vaping Use: Never used  Substance and Sexual Activity   Alcohol use: No   Drug use: No   Sexual activity: Not on file  Other Topics Concern   Not on file  Social History Narrative   Regular exercise- no    Social Determinants of Health   Financial Resource Strain: Low Risk    Difficulty of Paying Living Expenses: Not hard at all  Food Insecurity: No Food Insecurity   Worried About Charity fundraiser in the Last Year: Never true   [place] in the Last Year: Never true  Transportation Needs: No Transportation Needs   Lack of Transportation (Medical): No   Lack of Transportation (Non-Medical): No  Physical Activity: Inactive   Days of Exercise per Week: 0 days   Minutes of Exercise per Session: 0 min  Stress: No Stress Concern Present   Feeling of Stress : Not at all  Social Connections: Socially Integrated   Frequency of Communication with Friends and Family: More than three times a week   Frequency of Social Gatherings with Friends and Family: More than three times a week   Attends Religious Services: More than 4 times per year   Active  Member of [employer] or Organizations: Yes   Attends Music therapist: More than 4 times per year   Marital Status: Married     Family History: The patient's family history includes COPD in her mother; Colon cancer in her maternal aunt; Diabetes in her paternal uncle; Heart attack in an other family member; Heart attack (age of onset: 86) in her father; Lung cancer in her brother; Mental illness in an other family member. There is no history of Stroke.  ROS:   Please see the history of present illness.    She continues to tell me that she has shaking in her left arm.  She does not feel as strong as she used to.  She has lost greater than 30 pounds over the past year after being placed on semaglutide.  All other systems reviewed and are negative.  EKGs/Labs/Other Studies Reviewed:    The following studies were reviewed today:  LONG TERM MONITOR 07/2021: Study Highlights    Basic rhythm is normal sinus rhythm Non-sustained SVT at rates < 150 bpm. Longest 10 beats. PAC and PVC burden < 1% Poor correlation between symptoms and arrhythmia   CORONARY CTA 2021: IMPRESSION: 1. Coronary calcium score of 430. This was 69 percentile for age and sex matched control.   2. Normal coronary origin with right dominance.   3. Mild CAD noted in the mid LAD, D1 and mild Lcx.  CADRADS-2.   4. Study will be sent for FFR.   IMPRESSION: FFR findings not suggestive of obstructive CAD.  EKG:  EKG normal sinus rhythm, 97 bpm.  Normal tracing.  Compared to July 09, 2021, the rate is faster.  Compared to 08/06/2021, the rate is slower.  Recent Labs: 04/21/2021: TSH 1.58 08/09/2021: Magnesium 1.7 08/19/2021: ALT 25; BUN 17; Creatinine, Ser 0.93; Hemoglobin 12.1; Platelets 436.0; Potassium 3.8; Sodium 137  Recent Lipid Panel    Component  Value Date/Time   CHOL 164 01/27/2021 1147   TRIG 112.0 01/27/2021 1147   HDL 45.60 01/27/2021 1147   CHOLHDL 4 01/27/2021 1147   VLDL 22.4 01/27/2021 1147    LDLCALC 96 01/27/2021 1147    Physical Exam:    VS:  BP 136/82   Pulse (!) 101   Ht 5\' 6"  (1.676 m)   Wt 155 lb (70.3 kg)   LMP  (LMP Unknown)   SpO2 98%   BMI 25.02 kg/m     Wt Readings from Last 3 Encounters:  09/29/21 155 lb (70.3 kg)  09/23/21 153 lb 12.8 oz (69.8 kg)  09/18/21 156 lb (70.8 kg)  Orthostatic blood pressure check today: Sitting 134/82 mmHg heart rate 84 bpm Standing 124/82 mmHg heart rate 107 bpm.  GEN: BMI 25. No acute distress HEENT: Normal NECK: No JVD. LYMPHATICS: No lymphadenopathy CARDIAC: No murmur. RRR no gallop, or edema. VASCULAR:  Normal Pulses. No bruits. RESPIRATORY:  Clear to auscultation without rales, wheezing or rhonchi  ABDOMEN: Soft, non-tender, non-distended, No pulsatile mass, MUSCULOSKELETAL: No deformity  SKIN: Warm and dry NEUROLOGIC:  Alert and oriented x 3 PSYCHIATRIC:  Normal affect   ASSESSMENT:    1. Tachycardia   2. Essential hypertension   3. PVC (premature ventricular contraction)   4. AIVR (accelerated idioventricular rhythm) ([place])   5. Hyperlipidemia associated with type 2 diabetes mellitus ([place])   6. Type 2 diabetes mellitus with diabetic peripheral angiopathy without gangrene, with long-term current use of insulin ([place])   7. CAD in native artery    PLAN:    In order of problems listed above:  The feeling of orthostatic tachycardia, tingling in shoulders and arms, weak feeling, shaky at times when standing.  Need to exclude LV dysfunction associated with recent sepsis.  Also need to consider that she could be developing intermittent orthostasis due to recent significant weight loss.  Greater than 30 pounds have been lost since starting somatically tied.  We will check a 2D Doppler echocardiogram.  Have encouraged her to get a Kardia mobile to send [place] strips when she has excessively elevated heart rate so that we can determine if there is PSVT/A. fib/or some other arrhythmia.  Currently I believe this is probably  all sinus tachycardia and could be related to an orthostatic type syndrome. Decrease losartan to 50 mg/day.  With recent significant weight loss, perhaps she will feel better on less intense antihypertensive therapy. Remote diagnosis by monitor Not discussed Not discussed Nonobstructive CAD by coronary CTA 2 years ago.  This does not sound ischemic.  For the time being, recommend clinical observation, transmit strips to [place] when heart rate is really high using the cardia mobile device.  2D Doppler echocardiogram will be done to rule out LV dysfunction secondary to sepsis.   Medication Adjustments/Labs and Tests Ordered: Current medicines are reviewed at length with the patient today.  Concerns regarding medicines are outlined above.  Orders Placed This Encounter  Procedures   EKG 12-Lead   ECHOCARDIOGRAM COMPLETE   Meds ordered this encounter  Medications   losartan (COZAAR) 50 MG tablet    Sig: Take 1 tablet (50 mg total) by mouth daily.    Dispense:  90 tablet    Refill:  3    Dose change    Patient Instructions  Medication Instructions:  1) DECREASE Losartan to 50mg  once daily  *If you need a refill on your cardiac medications before your next appointment, please call your pharmacy*  Lab Work: None If you have labs (blood work) drawn today and your tests are completely normal, you will receive your results only by: [place] (if you have MyChart) OR A paper copy in the mail If you have any lab test that is abnormal or we need to change your treatment, we will call you to review the results.   Testing/Procedures: Your physician has requested that you have an echocardiogram. Echocardiography is a painless test that uses sound waves to create images of your heart. It provides your doctor with information about the size and shape of your heart and how well your heart's chambers and valves are working. This procedure takes approximately one hour. There are no  restrictions for this procedure.   Follow-Up: At [facility], you and your health needs are our priority.  As part of our continuing mission to provide you with exceptional heart care, we have created designated Provider Care Teams.  These Care Teams include your primary Cardiologist (physician) and Advanced Practice Providers (APPs -  Physician Assistants and Nurse Practitioners) who all work together to provide you with the care you need, when you need it.  We recommend signing up for the patient portal called "MyChart".  Sign up information is provided on this After Visit Summary.  MyChart is used to connect with patients for Virtual Visits (Telemedicine).  Patients are able to view lab/test results, encounter notes, upcoming appointments, etc.  Non-urgent messages can be sent to your provider as well.   To learn more about what you can do with MyChart, go to [email or web address].    Your next appointment:   As needed  The format for your next appointment:   In Person  Provider:   You may see Sinclair Grooms, MD or one of the following Advanced Practice Providers on your designated Care Team:   Cecilie Kicks, NP   Other Instructions     Signed, Sinclair Grooms, MD  09/29/2021 11:12 AM    [place]

## 2021-09-29 ENCOUNTER — Ambulatory Visit (INDEPENDENT_AMBULATORY_CARE_PROVIDER_SITE_OTHER): Payer: Medicare Other | Admitting: Interventional Cardiology

## 2021-09-29 ENCOUNTER — Other Ambulatory Visit: Payer: Self-pay

## 2021-09-29 ENCOUNTER — Encounter: Payer: Self-pay | Admitting: Interventional Cardiology

## 2021-09-29 VITALS — BP 136/82 | HR 101 | Ht 66.0 in | Wt 155.0 lb

## 2021-09-29 DIAGNOSIS — I493 Ventricular premature depolarization: Secondary | ICD-10-CM

## 2021-09-29 DIAGNOSIS — I442 Atrioventricular block, complete: Secondary | ICD-10-CM

## 2021-09-29 DIAGNOSIS — I2510 Atherosclerotic heart disease of native coronary artery without angina pectoris: Secondary | ICD-10-CM

## 2021-09-29 DIAGNOSIS — Z794 Long term (current) use of insulin: Secondary | ICD-10-CM

## 2021-09-29 DIAGNOSIS — R000 Tachycardia, unspecified: Secondary | ICD-10-CM | POA: Diagnosis not present

## 2021-09-29 DIAGNOSIS — E1169 Type 2 diabetes mellitus with other specified complication: Secondary | ICD-10-CM | POA: Diagnosis not present

## 2021-09-29 DIAGNOSIS — E1151 Type 2 diabetes mellitus with diabetic peripheral angiopathy without gangrene: Secondary | ICD-10-CM | POA: Diagnosis not present

## 2021-09-29 DIAGNOSIS — I10 Essential (primary) hypertension: Secondary | ICD-10-CM

## 2021-09-29 DIAGNOSIS — E785 Hyperlipidemia, unspecified: Secondary | ICD-10-CM | POA: Diagnosis not present

## 2021-09-29 MED ORDER — LOSARTAN POTASSIUM 50 MG PO TABS: 50.0000 mg | ORAL_TABLET | Freq: Every day | ORAL | 3 refills | Status: DC

## 2021-09-29 NOTE — Patient Instructions (Signed)
Medication Instructions:  1) DECREASE Losartan to 50mg  once daily  *If you need a refill on your cardiac medications before your next appointment, please call your pharmacy*   Lab Work: None If you have labs (blood work) drawn today and your tests are completely normal, you will receive your results only by: [place] (if you have MyChart) OR A paper copy in the mail If you have any lab test that is abnormal or we need to change your treatment, we will call you to review the results.   Testing/Procedures: Your physician has requested that you have an echocardiogram. Echocardiography is a painless test that uses sound waves to create images of your heart. It provides your doctor with information about the size and shape of your heart and how well your heart's chambers and valves are working. This procedure takes approximately one hour. There are no restrictions for this procedure.   Follow-Up: At [facility], you and your health needs are our priority.  As part of our continuing mission to provide you with exceptional heart care, we have created designated Provider Care Teams.  These Care Teams include your primary Cardiologist (physician) and Advanced Practice Providers (APPs -  Physician Assistants and Nurse Practitioners) who all work together to provide you with the care you need, when you need it.  We recommend signing up for the patient portal called "MyChart".  Sign up information is provided on this After Visit Summary.  MyChart is used to connect with patients for Virtual Visits (Telemedicine).  Patients are able to view lab/test results, encounter notes, upcoming appointments, etc.  Non-urgent messages can be sent to your provider as well.   To learn more about what you can do with MyChart, go to [email or web address].    Your next appointment:   As needed  The format for your next appointment:   In Person  Provider:   You may see Sinclair Grooms, MD or one of the  following Advanced Practice Providers on your designated Care Team:   Cecilie Kicks, NP   Other Instructions

## 2021-10-01 NOTE — Progress Notes (Deleted)
[place] Phone: [phone] Subjective:    I'm seeing this patient by the request  of:  [name], MD  CC:   [id number]  09/18/2021 No significant increase in displacement noted.  Patient does have a Baker's cyst noted.  Likely more contributing to the discomfort at the time.  We discussed the possibility of aspiration.  Patient wants to hold at the moment.  Discussed icing regimen and home exercises.  Discussed that compression could be beneficial.  Patient will try this and follow-up with me again in 2 to 3 weeks.  If no significant improvement will do aspiration at that time.  Update 10/02/2021 Christine [name] is a 70 y.o. female coming in with complaint of L knee pain.      Past Medical History:  Diagnosis Date   Acute bursitis of right shoulder 08/29/2018   Right shoulder injected in August 29, 2018   Allergic rhinitis    Closed displaced fracture of fifth metatarsal bone of left foot 03/16/2017   DEPRESSIVE DISORDER NOT ELSEWHERE CLASSIFIED 03/27/2010   Qualifier: Diagnosis of  By: [name] MD, [name]   [name], NP , [facility] [place]     Diabetes ([place]) 04/02/2016   Diabetes mellitus    Essential hypertension 03/14/2008   Qualifier: Diagnosis of  By: [name] MD, [name]     Fibromyalgia 08/12/2011   Diagnosed by [name] , Rheumatologist 2010    GERD (gastroesophageal reflux disease) 12/25/2016   Gluteal tendonitis of right buttock 10/19/2017   Injected in October 19, 2017 Injected October 07, 2018   Greater trochanteric bursitis of left hip 06/17/2016   Greater trochanteric bursitis of right hip 11/27/2015   Injected 11/27/2015    History of recurrent UTIs    [name]   HTN (hypertension)    Hx of osteopenia 10/11/2012   [facility]; ordered by [name] Lowest T score: -2.1 @ lumbar spine @ [facility] 11/01/12; decrease of 8.5% vs 05/2009. There is 9.2% risk  over 10 yrs History fracture left elbow @ 4 Fractured left wrist , right elbow and nose post fall July/18/2013. Dr. [name] No FH Osteoporosis No PMH of bisphosphonate therapy (generic Fosamax Rxed but not filled  due to polypharmacy )    Hyperlipidemia    Hyperlipidemia associated with type 2 diabetes mellitus ([place]) 01/18/2007   Qualifier: Diagnosis of  By: [name]  With DM LDL goal = < 100, ideally < 70. Mi in father @ 63; 2 brothers @ 51 & 24     Lumbar radiculopathy 04/11/2008   Qualifier: Diagnosis of  By: [name] MD, Erick Blinks well to epidural 01/12/2017  repeat epidural given May 2018   Migraine headache    quiescent   Nonallopathic lesion of cervical region 07/19/2014   Nonallopathic lesion of lumbosacral region 01/15/2016   Nonallopathic lesion of sacral region 06/27/2018   Nonallopathic lesion-rib cage 09/26/2014   Palpitations 04/15/2011   Rotator cuff impingement syndrome of left shoulder 05/11/2014   Seasonal allergic rhinitis 03/21/2012   Sinusitis, chronic 04/20/2016   Sleep disorder    Dr Dohmier   Subluxation of peroneal tendon of right foot 11/10/2017   TIA (transient ischemic attack)    [place], HX OF 02/02/2008   Qualifier: Diagnosis of  By: Donalee Citrin ADVRS EFF UNS RX MEDICINAL&BIOLOGICAL [facility] 02/02/2008   Qualifier: Diagnosis of  By:  [name] MD, [name]     UNSPECIFIED MYALGIA AND MYOSITIS 02/02/2008   Qualifier: Diagnosis of  By: [name] MD, [name]     UTI (urinary tract infection) 10/05/2014   Viral upper respiratory tract infection with cough 12/31/2014   Vitamin D deficiency 05/25/2008   Qualifier: Diagnosis of  By: [name] MD, [name]     Past Surgical History:  Procedure Laterality Date   COLONOSCOPY     Dr Carlean [name]; hemorrhoids   CORONARY ANGIOPLASTY  1997   for chest pain- negative    POLYPECTOMY  2002   benign, hyperplastic polyp; rectal bleeding 2008   TONSILLECTOMY     TOTAL ABDOMINAL HYSTERECTOMY  1983   BSO for  endometriosis   WISDOM TOOTH EXTRACTION     Social History   Socioeconomic History   Marital status: Married    Spouse name: Not on file   Number of children: Not on file   Years of education: Not on file   Highest education level: Not on file  Occupational History   Occupation: Research scientist (physical sciences)     Employer: [employer]  Tobacco Use   Smoking status: Never   Smokeless tobacco: Never  Vaping Use   Vaping Use: Never used  Substance and Sexual Activity   Alcohol use: No   Drug use: No   Sexual activity: Not on file  Other Topics Concern   Not on file  Social History Narrative   Regular exercise- no    Social Determinants of Health   Financial Resource Strain: Low Risk    Difficulty of Paying Living Expenses: Not hard at all  Food Insecurity: No Food Insecurity   Worried About Charity fundraiser in the Last Year: Never true   [place] in the Last Year: Never true  Transportation Needs: No Transportation Needs   Lack of Transportation (Medical): No   Lack of Transportation (Non-Medical): No  Physical Activity: Inactive   Days of Exercise per Week: 0 days   Minutes of Exercise per Session: 0 min  Stress: No Stress Concern Present   Feeling of Stress : Not at all  Social Connections: Socially Integrated   Frequency of Communication with Friends and Family: More than three times a week   Frequency of Social Gatherings with Friends and Family: More than three times a week   Attends Religious Services: More than 4 times per year   Active Member of [employer] or Organizations: Yes   Attends Music therapist: More than 4 times per year   Marital Status: Married   Allergies  Allergen Reactions   Vioxx [Rofecoxib] Other (See Comments)    Excess BP which caused TIA    Prednisone Other (See Comments)    Mental status changes with high-dose oral agent. Tolerates epidural steroid injections. No associated rash or fever   Macrodantin  [Nitrofurantoin Macrocrystal] Hives   Sulfa Antibiotics     Hives   Colesevelam Other (See Comments)    Leg Pain   Family History  Problem Relation Age of Onset   Colon cancer Maternal Aunt    Diabetes Paternal Uncle    Heart attack Father 54   COPD Mother    Lung cancer Brother        smoker   Mental illness Other        niece, committed suicide.   Heart attack Other        brother X 2; @ 26 & 52   Stroke Neg  Hx     Current Outpatient Medications (Endocrine & Metabolic):    Insulin Glargine (BASAGLAR KWIKPEN) 100 UNIT/ML, Inject 10 Units into the skin every morning.   Semaglutide,0.25 or 0.5MG /DOS, (OZEMPIC, 0.25 OR 0.5 MG/DOSE,) 2 MG/1.5ML SOPN, Inject 0.5 mg into the skin once a week. (Patient taking differently: Inject 0.5 mg into the skin once a week. Tuesday)  Current Outpatient Medications (Cardiovascular):    atorvastatin (LIPITOR) 40 MG tablet, Take 1 tablet (40 mg total) by mouth daily.   losartan (COZAAR) 50 MG tablet, Take 1 tablet (50 mg total) by mouth daily.  Current Outpatient Medications (Respiratory):    promethazine (PHENERGAN) 12.5 MG tablet, Take 1 tablet (12.5 mg total) by mouth every 8 (eight) hours as needed for nausea or vomiting.  Current Outpatient Medications (Analgesics):    acetaminophen (TYLENOL) 500 MG tablet, Take 500 mg by mouth as needed.   aspirin 81 MG tablet, Take 81 mg by mouth daily.   Current Outpatient Medications (Other):    BD PEN NEEDLE NANO 2ND GEN 32G X 4 MM MISC, USE ONCE A DAY AS DIRECTED   busPIRone (BUSPAR) 15 MG tablet, Take 15 mg by mouth 3 (three) times daily.   DULoxetine (CYMBALTA) 60 MG capsule, Take 60 mg by mouth daily.   Lancets (ONETOUCH ULTRASOFT) lancets, Check blood sugar once daily. Dx code: 250.00   LORazepam (ATIVAN) 0.5 MG tablet, Take 0.5 mg by mouth as needed for anxiety.   omeprazole (PRILOSEC) 20 MG capsule, Take 1 capsule (20 mg total) by mouth daily.   ONETOUCH ULTRA test strip, USE 1 STRIP TWICE  DAILY DX CODE E11.65   tiZANidine (ZANAFLEX) 2 MG tablet, Take 1 tablet (2 mg total) by mouth at bedtime.   traZODone (DESYREL) 50 MG tablet, TAKE 1/2 TO 1 TABLET BY MOUTH AT BEDTIME AS NEEDED FOR SLEEP (Patient taking differently: Take 50 mg by mouth as needed.)   Vitamin D, Ergocalciferol, (DRISDOL) 1.25 MG (50000 UNIT) CAPS capsule, TAKE 1 CAPSULE BY MOUTH ONE TIME PER WEEK   Reviewed prior external information including notes and imaging from  primary care provider As well as notes that were available from care everywhere and other healthcare systems.  Past medical history, social, surgical and family history all reviewed in electronic medical record.  No pertanent information unless stated regarding to the chief complaint.   Review of Systems:  No headache, visual changes, nausea, vomiting, diarrhea, constipation, dizziness, abdominal pain, skin rash, fevers, chills, night sweats, weight loss, swollen lymph nodes, body aches, joint swelling, chest pain, shortness of breath, mood changes. POSITIVE muscle aches  Objective  There were no vitals taken for this visit.   General: No apparent distress alert and oriented x3 mood and affect normal, dressed appropriately.  HEENT: Pupils equal, extraocular movements intact  Respiratory: Patient's speak in full sentences and does not appear short of breath  Cardiovascular: No lower extremity edema, non tender, no erythema  Gait normal with good balance and coordination.  MSK:  Non tender with full range of motion and good stability and symmetric strength and tone of shoulders, elbows, wrist, hip, knee and ankles bilaterally.     Impression and Recommendations:     The above documentation has been reviewed and is accurate and complete Jacqualin Combes

## 2021-10-02 ENCOUNTER — Ambulatory Visit: Payer: Medicare Other | Admitting: Family Medicine

## 2021-10-07 NOTE — Progress Notes (Signed)
Christine [name] [address] [place] Phone: [phone] Subjective:   Christine [name], am serving as a scribe for Dr. Hulan Saas. This visit occurred during the SARS-CoV-2 public health emergency.  Safety protocols were in place, including screening questions prior to the visit, additional usage of staff PPE, and extensive cleaning of exam room while observing appropriate contact time as indicated for disinfecting solutions.   I'm seeing this patient by the request  of:  [name], MD  CC: left knee pain   [id number]  09/18/2021 No significant increase in displacement noted.  Patient does have a Baker's cyst noted.  Likely more contributing to the discomfort at the time.  We discussed the possibility of aspiration.  Patient wants to hold at the moment.  Discussed icing regimen and home exercises.  Discussed that compression could be beneficial.  Patient will try this and follow-up with me again in 2 to 3 weeks.  If no significant improvement will do aspiration at that time.  Updated 10/09/2021 Christine [name] is a 70 y.o. female coming in with complaint of left knee pain. Found to have what appeared to be a meniscal tear. Hurts her knee every Tuesday. Isn't as swollen as it has been. When pain does occur now it sometimes goes into her hamstring and medial tibial area.   Mild to moderate djd of the left knee on xray      Past Medical History:  Diagnosis Date   Acute bursitis of right shoulder 08/29/2018   Right shoulder injected in August 29, 2018   Allergic rhinitis    Closed displaced fracture of fifth metatarsal bone of left foot 03/16/2017   DEPRESSIVE DISORDER NOT ELSEWHERE CLASSIFIED 03/27/2010   Qualifier: Diagnosis of  By: [name] MD, [name]   [name], NP , [facility] [place]     Diabetes ([place]) 04/02/2016   Diabetes mellitus    Essential hypertension 03/14/2008   Qualifier: Diagnosis of   By: [name] MD, [name]     Fibromyalgia 08/12/2011   Diagnosed by [name] , Rheumatologist 2010    GERD (gastroesophageal reflux disease) 12/25/2016   Gluteal tendonitis of right buttock 10/19/2017   Injected in October 19, 2017 Injected October 07, 2018   Greater trochanteric bursitis of left hip 06/17/2016   Greater trochanteric bursitis of right hip 11/27/2015   Injected 11/27/2015    History of recurrent UTIs    [name]   HTN (hypertension)    Hx of osteopenia 10/11/2012   [facility]; ordered by [name] Lowest T score: -2.1 @ lumbar spine @ [facility] 11/01/12; decrease of 8.5% vs 05/2009. There is 9.2% risk over 10 yrs History fracture left elbow @ 4 Fractured left wrist , right elbow and nose post fall July/18/2013. Dr. [name] No FH Osteoporosis No PMH of bisphosphonate therapy (generic Fosamax Rxed but not filled  due to polypharmacy )    Hyperlipidemia    Hyperlipidemia associated with type 2 diabetes mellitus ([place]) 01/18/2007   Qualifier: Diagnosis of  By: [name]  With DM LDL goal = < 100, ideally < 70. Mi in father @ 49; 2 brothers @ 31 & 49     Lumbar radiculopathy 04/11/2008   Qualifier: Diagnosis of  By: [name] MD, Erick Blinks well to epidural 01/12/2017  repeat epidural given May 2018   Migraine headache    quiescent   Nonallopathic lesion of cervical region 07/19/2014  Nonallopathic lesion of lumbosacral region 01/15/2016   Nonallopathic lesion of sacral region 06/27/2018   Nonallopathic lesion-rib cage 09/26/2014   Palpitations 04/15/2011   Rotator cuff impingement syndrome of left shoulder 05/11/2014   Seasonal allergic rhinitis 03/21/2012   Sinusitis, chronic 04/20/2016   Sleep disorder    Dr Dohmier   Subluxation of peroneal tendon of right foot 11/10/2017   TIA (transient ischemic attack)    [place], HX OF 02/02/2008   Qualifier: Diagnosis of  By: Donalee Citrin ADVRS EFF UNS RX MEDICINAL&BIOLOGICAL [place] 02/02/2008    Qualifier: Diagnosis of  By: [name] MD, Posey Pronto AND MYOSITIS 02/02/2008   Qualifier: Diagnosis of  By: [name] MD, [name]     UTI (urinary tract infection) 10/05/2014   Viral upper respiratory tract infection with cough 12/31/2014   Vitamin D deficiency 05/25/2008   Qualifier: Diagnosis of  By: [name] MD, [name]     Past Surgical History:  Procedure Laterality Date   COLONOSCOPY     Dr Carlean [name]; hemorrhoids   CORONARY ANGIOPLASTY  1997   for chest pain- negative    POLYPECTOMY  2002   benign, hyperplastic polyp; rectal bleeding 2008   TONSILLECTOMY     TOTAL ABDOMINAL HYSTERECTOMY  1983   BSO for endometriosis   WISDOM TOOTH EXTRACTION     Social History   Socioeconomic History   Marital status: Married    Spouse name: Not on file   Number of children: Not on file   Years of education: Not on file   Highest education level: Not on file  Occupational History   Occupation: Research scientist (physical sciences)     Employer: [employer]  Tobacco Use   Smoking status: Never   Smokeless tobacco: Never  Vaping Use   Vaping Use: Never used  Substance and Sexual Activity   Alcohol use: No   Drug use: No   Sexual activity: Not on file  Other Topics Concern   Not on file  Social History Narrative   Regular exercise- no    Social Determinants of Health   Financial Resource Strain: Low Risk    Difficulty of Paying Living Expenses: Not hard at all  Food Insecurity: No Food Insecurity   Worried About Charity fundraiser in the Last Year: Never true   Arboriculturist in the Last Year: Never true  Transportation Needs: No Transportation Needs   Lack of Transportation (Medical): No   Lack of Transportation (Non-Medical): No  Physical Activity: Inactive   Days of Exercise per Week: 0 days   Minutes of Exercise per Session: 0 min  Stress: No Stress Concern Present   Feeling of Stress : Not at all  Social Connections: Socially Integrated   Frequency of  Communication with Friends and Family: More than three times a week   Frequency of Social Gatherings with Friends and Family: More than three times a week   Attends Religious Services: More than 4 times per year   Active Member of [employer] or Organizations: Yes   Attends Music therapist: More than 4 times per year   Marital Status: Married   Allergies  Allergen Reactions   Vioxx [Rofecoxib] Other (See Comments)    Excess BP which caused TIA    Prednisone Other (See Comments)    Mental status changes with high-dose oral agent. Tolerates epidural steroid injections. No associated rash or fever   Macrodantin [  Nitrofurantoin Macrocrystal] Hives   Sulfa Antibiotics     Hives   Colesevelam Other (See Comments)    Leg Pain   Family History  Problem Relation Age of Onset   Colon cancer Maternal Aunt    Diabetes Paternal Uncle    Heart attack Father 70   COPD Mother    Lung cancer Brother        smoker   Mental illness Other        niece, committed suicide.   Heart attack Other        brother X 2; @ 22 & 64   Stroke Neg Hx     Current Outpatient Medications (Endocrine & Metabolic):    Insulin Glargine (BASAGLAR KWIKPEN) 100 UNIT/ML, Inject 10 Units into the skin every morning.   Semaglutide,0.25 or 0.5MG /DOS, (OZEMPIC, 0.25 OR 0.5 MG/DOSE,) 2 MG/1.5ML SOPN, Inject 0.5 mg into the skin once a week. (Patient taking differently: Inject 0.5 mg into the skin once a week. Tuesday)  Current Outpatient Medications (Cardiovascular):    atorvastatin (LIPITOR) 40 MG tablet, Take 1 tablet (40 mg total) by mouth daily.   losartan (COZAAR) 50 MG tablet, Take 1 tablet (50 mg total) by mouth daily.  Current Outpatient Medications (Respiratory):    promethazine (PHENERGAN) 12.5 MG tablet, Take 1 tablet (12.5 mg total) by mouth every 8 (eight) hours as needed for nausea or vomiting.  Current Outpatient Medications (Analgesics):    acetaminophen (TYLENOL) 500 MG tablet, Take 500 mg by  mouth as needed.   aspirin 81 MG tablet, Take 81 mg by mouth daily.   Current Outpatient Medications (Other):    BD PEN NEEDLE NANO 2ND GEN 32G X 4 MM MISC, USE ONCE A DAY AS DIRECTED   busPIRone (BUSPAR) 15 MG tablet, Take 15 mg by mouth 3 (three) times daily.   DULoxetine (CYMBALTA) 60 MG capsule, Take 60 mg by mouth daily.   Lancets (ONETOUCH ULTRASOFT) lancets, Check blood sugar once daily. Dx code: 250.00   LORazepam (ATIVAN) 0.5 MG tablet, Take 0.5 mg by mouth as needed for anxiety.   omeprazole (PRILOSEC) 20 MG capsule, Take 1 capsule (20 mg total) by mouth daily.   ONETOUCH ULTRA test strip, USE 1 STRIP TWICE DAILY DX CODE E11.65   tiZANidine (ZANAFLEX) 2 MG tablet, Take 1 tablet (2 mg total) by mouth at bedtime.   traZODone (DESYREL) 50 MG tablet, TAKE 1/2 TO 1 TABLET BY MOUTH AT BEDTIME AS NEEDED FOR SLEEP (Patient taking differently: Take 50 mg by mouth as needed.)   Vitamin D, Ergocalciferol, (DRISDOL) 1.25 MG (50000 UNIT) CAPS capsule, TAKE 1 CAPSULE BY MOUTH ONE TIME PER WEEK   Reviewed prior external information including notes and imaging from  primary care provider As well as notes that were available from care everywhere and other healthcare systems.  Past medical history, social, surgical and family history all reviewed in electronic medical record.  No pertanent information unless stated regarding to the chief complaint.   Review of Systems:  No headache, visual changes, nausea, vomiting, diarrhea, constipation, dizziness, abdominal pain, skin rash, fevers, chills, night sweats, weight loss, swollen lymph nodes, body aches, joint swelling, chest pain, shortness of breath, mood changes. POSITIVE muscle aches  Objective  There were no vitals taken for this visit.   General: No apparent distress alert and oriented x3 mood and affect normal, dressed appropriately.  HEENT: Pupils equal, extraocular movements intact  Respiratory: Patient's speak in full sentences and does  not appear short  of breath  Cardiovascular: No lower extremity edema, non tender, no erythema  Gait normal with good balance and coordination.  MSK:  left knee exam shows tender to palpation still over the medial joint line.  Mild positive McMurray still noted.  Patient does still have fullness of the popliteal area.  Limited muscular skeletal ultrasound was performed and interpreted by Hulan Saas, M  Limited ultrasound shows the patient still has the degenerative potentially acute on chronic medial meniscal tear noted but no significant displacement.  Baker's cyst noted.   Procedure: Real-time Ultrasound Guided Injection of left knee Device: GE Logiq Q7 Ultrasound guided injection is preferred based studies that show increased duration, increased effect, greater accuracy, decreased procedural pain, increased response rate, and decreased cost with ultrasound guided versus blind injection.  Verbal informed consent obtained.  Time-out conducted.  Noted no overlying erythema, induration, or other signs of local infection.  Skin prepped in a sterile fashion.  Local anesthesia: Topical Ethyl chloride.  With sterile technique and under real time ultrasound guidance: With a 22-gauge 2 inch needle patient was injected with 4 cc of 0.5% Marcaine and aspirated 20 cc of straw-colored colored fluid and then injected 1 cc of Kenalog 40 mg/dL. This was from posterior approach.  Completed without difficulty  Pain immediately resolved suggesting accurate placement of the medication.  Advised to call if fevers/chills, erythema, induration, drainage, or persistent bleeding.  Impression: Technically successful ultrasound guided injection.    Impression and Recommendations:     The above documentation has been reviewed and is accurate and complete Lyndal Pulley, DO

## 2021-10-09 ENCOUNTER — Other Ambulatory Visit: Payer: Self-pay

## 2021-10-09 ENCOUNTER — Ambulatory Visit: Payer: Self-pay

## 2021-10-09 ENCOUNTER — Encounter: Payer: Self-pay | Admitting: Family Medicine

## 2021-10-09 ENCOUNTER — Ambulatory Visit (INDEPENDENT_AMBULATORY_CARE_PROVIDER_SITE_OTHER): Payer: Medicare Other | Admitting: Family Medicine

## 2021-10-09 ENCOUNTER — Ambulatory Visit (HOSPITAL_COMMUNITY): Payer: Medicare Other | Attending: Internal Medicine

## 2021-10-09 VITALS — BP 124/78 | HR 93 | Ht 66.0 in | Wt 154.0 lb

## 2021-10-09 DIAGNOSIS — I493 Ventricular premature depolarization: Secondary | ICD-10-CM | POA: Insufficient documentation

## 2021-10-09 DIAGNOSIS — M7122 Synovial cyst of popliteal space [Baker], left knee: Secondary | ICD-10-CM | POA: Diagnosis not present

## 2021-10-09 DIAGNOSIS — R000 Tachycardia, unspecified: Secondary | ICD-10-CM | POA: Diagnosis not present

## 2021-10-09 DIAGNOSIS — S83242A Other tear of medial meniscus, current injury, left knee, initial encounter: Secondary | ICD-10-CM | POA: Diagnosis not present

## 2021-10-09 DIAGNOSIS — I2510 Atherosclerotic heart disease of native coronary artery without angina pectoris: Secondary | ICD-10-CM | POA: Insufficient documentation

## 2021-10-09 LAB — ECHOCARDIOGRAM COMPLETE
Area-P 1/2: 4.06 cm2
Height: 66 in
S' Lateral: 2.2 cm
Weight: 2464 oz

## 2021-10-09 NOTE — Assessment & Plan Note (Signed)
Patient continues to be symptomatic.  We will continue to monitor.  Worsening instability of the knee or catching we will need to consider advanced imaging.  Patient will follow-up again in 6 weeks with continued conservative therapy.

## 2021-10-09 NOTE — Assessment & Plan Note (Signed)
Aspiration done today and tolerated the procedure well.  Patient is a diabetic but is very good at watching her blood sugars.  Patient will continue to monitor and hopefully with patient's weight loss she will even have the possibility of decreasing her insulin even more.  We discussed with patient about continuing to avoid twisting motions with the [place] medial meniscal tear.  If we continue to have some instability do feel that advanced imaging would be warranted.  Follow-up with me again 6 weeks

## 2021-10-09 NOTE — Patient Instructions (Signed)
Drained the cyst Still avoid twisting motion If this happens to frequently we'll have to get an MRI See you again in 6-8 weeks

## 2021-10-10 ENCOUNTER — Encounter: Payer: Self-pay | Admitting: Endocrinology

## 2021-10-10 ENCOUNTER — Telehealth: Payer: Medicare Other

## 2021-10-10 NOTE — Progress Notes (Incomplete)
Chronic Care Management Pharmacy Note  10/10/2021 Name:  Christine [name] MRN:  [id number] DOB:  1951/04/02  Summary: ***  Recommendations/Changes made from today's visit: ***  Plan: ***  Subjective: Christine [name] is an 70 y.o. year old female who is a primary patient of [name], MD.  The CCM team was consulted for assistance with disease management and care coordination needs.    Engaged with patient by telephone for initial visit in response to provider referral for pharmacy case management and/or care coordination services.   Consent to Services:  The patient was given the following information about Chronic Care Management services today, agreed to services, and gave verbal consent: 1. CCM service includes personalized support from designated clinical staff supervised by the primary care provider, including individualized plan of care and coordination with other care providers 2. 24/7 contact phone numbers for assistance for urgent and routine care needs. 3. Service will only be billed when office clinical staff spend 20 minutes or more in a month to coordinate care. 4. Only one practitioner may furnish and bill the service in a calendar month. 5.The patient may stop CCM services at any time (effective at the end of the month) by phone call to the office staff. 6. The patient will be responsible for cost sharing (co-pay) of up to 20% of the service fee (after annual deductible is met). Patient agreed to services and consent obtained.  Patient Care Team: [name], MD as PCP - General (Internal Medicine) Belva Crome, MD as PCP - Cardiology (Cardiology) Pieter Partridge, DO as Consulting Physician (Neurology) Charlton Haws, [facility] as Pharmacist (Pharmacist)  Recent office visits: 08/14/21 - Dr. Sharlet Salina - hospital follow up - patient to finish current keflex prescription then will resume low dose daily keflex of UTI ppx  Recent consult  visits: 10/09/21 - Dr. Tamala Julian - Sports Medicine - left knee pain - aspiration completed - follow up in 6-8 weeks  09/29/21 - Dr. Tamala Julian - Cardiology - f/u for rapid HR and palpitations - losartan decreased to 15m daily  09/23/21- Dr. ELoanne Drilling- Endocrine - basaglar reduced to 10 units daily - f/u in 2 months  09/18/21 - Dr. STamala Julian- sports medicine - baker's cyst noted - possible aspiration - recommended icing regimen 09/02/21 - Dr. STamala Julian- sports medicine - identified chronic tear of left medial meniscus - f/u in 4-6 weeks   Hospital visits: Hospital Admission 08/06/21-08/09/21 due to UTI - E. Coli bacteremia  - discharged on keflex - tramadol discontinued   Objective:  Lab Results  Component Value Date   CREATININE 0.93 08/19/2021   BUN 17 08/19/2021   GFR 61.53 08/19/2021   GFRNONAA >60 08/09/2021   GFRAA >60 06/09/2020   NA 137 08/19/2021   K 3.8 08/19/2021   CALCIUM 9.5 08/19/2021   CO2 27 08/19/2021   GLUCOSE 132 (H) 08/19/2021    Lab Results  Component Value Date/Time   HGBA1C 6.3 (A) 09/23/2021 10:54 AM   HGBA1C 6.1 (A) 06/19/2021 11:08 AM   HGBA1C 6.6 (H) 05/24/2015 11:37 AM   HGBA1C 8.3 (H) 02/22/2015 11:24 AM   GFR 61.53 08/19/2021 03:46 PM   GFR 87.14 08/14/2021 09:10 AM   MICROALBUR 10 04/23/2020 03:47 PM   MICROALBUR 1.1 11/21/2014 10:40 AM   MICROALBUR 0.6 08/08/2014 12:07 PM    Last diabetic Eye exam:  Lab Results  Component Value Date/Time   HMDIABEYEEXA No Retinopathy 05/18/2018 10:01 AM  Last diabetic Foot exam:  No results found for: HMDIABFOOTEX   Lab Results  Component Value Date   CHOL 164 01/27/2021   HDL 45.60 01/27/2021   LDLCALC 96 01/27/2021   TRIG 112.0 01/27/2021   CHOLHDL 4 01/27/2021    Hepatic Function Latest Ref Rng & Units 08/19/2021 08/14/2021 08/08/2021  Total Protein 6.0 - 8.3 g/dL 7.0 7.2 5.6(L)  Albumin 3.5 - 5.2 g/dL 3.8 3.8 2.8(L)  AST 0 - 37 U/L 15 21 14(L)  ALT 0 - 35 U/L 25 37(H) 20  Alk Phosphatase 39 - 117 U/L 70 61 55   Total Bilirubin 0.2 - 1.2 mg/dL 0.3 0.5 0.8  Bilirubin, Direct 0.0 - 0.3 mg/dL - - -    Lab Results  Component Value Date/Time   TSH 1.58 04/21/2021 01:52 PM   TSH 2.23 01/05/2020 11:35 AM   FREET4 0.91 03/13/2011 04:36 PM    CBC Latest Ref Rng & Units 08/19/2021 08/14/2021 08/09/2021  WBC 4.0 - 10.5 K/uL 7.6 6.7 4.5  Hemoglobin 12.0 - 15.0 g/dL 12.1 12.1 10.2(L)  Hematocrit 36.0 - 46.0 % 36.3 36.9 30.3(L)  Platelets 150.0 - 400.0 K/uL 436.0(H) 392.0 127(L)    Lab Results  Component Value Date/Time   VD25OH 43.41 04/21/2021 01:52 PM   VD25OH 48.28 04/23/2020 01:31 PM    Clinical ASCVD: No  The 10-year ASCVD risk score (Arnett DK, et al., 2019) is: 25.8%   Values used to calculate the score:     Age: 89 years     Sex: Female     Is Non-Hispanic African American: No     Diabetic: Yes     Tobacco smoker: No     Systolic Blood Pressure: 852 mmHg     Is BP treated: Yes     HDL Cholesterol: 45.6 mg/dL     Total Cholesterol: 164 mg/dL    Depression screen [facility] 2/9 06/04/2021 04/21/2021 04/23/2020  Decreased Interest 0 0 0  Down, Depressed, Hopeless 0 0 0  PHQ - 2 Score 0 0 0  Some recent data might be hidden    Social History   Tobacco Use  Smoking Status Never  Smokeless Tobacco Never   BP Readings from Last 3 Encounters:  10/09/21 124/78  09/29/21 136/82  09/23/21 128/70   Pulse Readings from Last 3 Encounters:  10/09/21 93  09/29/21 (!) 101  09/23/21 (!) 103   Wt Readings from Last 3 Encounters:  10/09/21 154 lb (69.9 kg)  09/29/21 155 lb (70.3 kg)  09/23/21 153 lb 12.8 oz (69.8 kg)   BMI Readings from Last 3 Encounters:  10/09/21 24.86 kg/m  09/29/21 25.02 kg/m  09/23/21 24.82 kg/m    Assessment/Interventions: Review of patient past medical history, allergies, medications, health status, including review of consultants reports, laboratory and other test data, was performed as part of comprehensive evaluation and provision of chronic care management  services.   SDOH:  (Social Determinants of Health) assessments and interventions performed: No  SDOH Screenings   Alcohol Screen: Low Risk    Last Alcohol Screening Score (AUDIT): 0  Depression (PHQ2-9): Low Risk    PHQ-2 Score: 0  Financial Resource Strain: Low Risk    Difficulty of Paying Living Expenses: Not hard at all  Food Insecurity: No Food Insecurity   Worried About Charity fundraiser in the Last Year: Never true   Ran Out of Food in the Last Year: Never true  Housing: [place]  Score: 0  Physical Activity: Inactive   Days of Exercise per Week: 0 days   Minutes of Exercise per Session: 0 min  Social Connections: Socially Integrated   Frequency of Communication with Friends and Family: More than three times a week   Frequency of Social Gatherings with Friends and Family: More than three times a week   Attends Religious Services: More than 4 times per year   Active Member of [employer] or Organizations: Yes   Attends Music therapist: More than 4 times per year   Marital Status: Married  Stress: No Stress Concern Present   Feeling of Stress : Not at all  Tobacco Use: Low Risk    Smoking Tobacco Use: Never   Smokeless Tobacco Use: Never   Passive Exposure: Not on file  Transportation Needs: No Transportation Needs   Lack of Transportation (Medical): No   Lack of Transportation (Non-Medical): No    CCM Care Plan  Allergies  Allergen Reactions   Vioxx [Rofecoxib] Other (See Comments)    Excess BP which caused TIA    Prednisone Other (See Comments)    Mental status changes with high-dose oral agent. Tolerates epidural steroid injections. No associated rash or fever   Macrodantin [Nitrofurantoin Macrocrystal] Hives   Sulfa Antibiotics     Hives   Colesevelam Other (See Comments)    Leg Pain    Medications Reviewed Today     Reviewed by Lyndal Pulley, DO (Physician) on 10/09/21 at [place] List Status: <None>   Medication  Order Taking? Sig Documenting Provider Last Dose Status Informant  acetaminophen (TYLENOL) 500 MG tablet [id number] No Take 500 mg by mouth as needed. [provider] Taking Active   aspirin 81 MG tablet [id number] No Take 81 mg by mouth daily. [provider] Taking Active Spouse/Significant Other  atorvastatin (LIPITOR) 40 MG tablet [id number] No Take 1 tablet (40 mg total) by mouth daily. [name], MD Taking Active Spouse/Significant Other           Med Note Trina Ao Aug 14, 2021  8:38 AM)    BD PEN NEEDLE NANO 2ND GEN 32G X 4 MM MISC [id number] No USE ONCE A DAY AS DIRECTED Renato Shin, MD Taking Active Spouse/Significant Other  busPIRone (BUSPAR) 15 MG tablet [id number] No Take 15 mg by mouth 3 (three) times daily. [provider] Taking Active   DULoxetine (CYMBALTA) 60 MG capsule [id number] No Take 60 mg by mouth daily. [provider] Taking Active Spouse/Significant Other           Med Note Thomes Cake   Thu Aug 14, 2021  8:38 AM)    Insulin Glargine [facility] KWIKPEN) 100 UNIT/ML [id number] No Inject 10 Units into the skin every morning. Renato Shin, MD Taking Active   Lancets [facility] ULTRASOFT) lancets [id number] No Check blood sugar once daily. Dx code: 80.00 Hendricks Limes, MD Taking Active Spouse/Significant Other           Med Note Blanch Media, CYNTHIA A   Mon Apr 21, 2021  1:09 PM) use  LORazepam (ATIVAN) 0.5 MG tablet [id number] No Take 0.5 mg by mouth as needed for anxiety. [provider] Taking Active Spouse/Significant Other           Med Note Thomes Cake   Thu Aug 14, 2021  8:36 AM)    losartan (COZAAR) 50 MG tablet [id number]  Take 1 tablet (50 mg total) by  mouth daily. Belva Crome, MD  Active   omeprazole (PRILOSEC) 20 MG capsule [id number] No Take 1 capsule (20 mg total) by mouth daily. Aline August, MD Taking Active   [facility] ULTRA test strip [id number] No USE 1 STRIP TWICE DAILY DX CODE E11.65  Renato Shin, MD Taking Active Spouse/Significant Other  promethazine (PHENERGAN) 12.5 MG tablet [id number] No Take 1 tablet (12.5 mg total) by mouth every 8 (eight) hours as needed for nausea or vomiting. [name], MD Taking Active Spouse/Significant Other  Semaglutide,0.25 or 0.5MG/DOS, (OZEMPIC, 0.25 OR 0.5 MG/DOSE,) 2 MG/1.5ML SOPN [id number] No Inject 0.5 mg into the skin once a week.  Patient taking differently: Inject 0.5 mg into the skin once a week. Tuesday   Renato Shin, MD Taking Active   tiZANidine (ZANAFLEX) 2 MG tablet [id number] No Take 1 tablet (2 mg total) by mouth at bedtime. Lyndal Pulley, DO Taking Active            Med Note Thomes Cake   Thu Aug 14, 2021  8:37 AM)    traZODone (DESYREL) 50 MG tablet [id number] No TAKE 1/2 TO 1 TABLET BY MOUTH AT BEDTIME AS NEEDED FOR SLEEP  Patient taking differently: Take 50 mg by mouth as needed.   [name], MD Taking Active   Vitamin D, Ergocalciferol, (DRISDOL) 1.25 MG (50000 UNIT) CAPS capsule [id number] No TAKE 1 CAPSULE BY MOUTH ONE TIME PER WEEK Lyndal Pulley, DO Taking Active             Patient Active Problem List   Diagnosis Date Noted   Baker's cyst of knee, left 10/09/2021   Acute medial meniscus tear of left knee 09/02/2021   Bacteremia 08/15/2021   Bacteria in urine 08/07/2021   Urinary hesitancy 08/07/2021   Elevated troponin 08/07/2021   Diabetes mellitus type 2 in nonobese ([place]) 08/07/2021   Tachycardia 08/07/2021   Sepsis ([place]) 08/06/2021   Dizziness 04/22/2021   Arthritis of right hip 02/12/2021   Leg swelling 07/12/2020   Cervical disc disorder with radiculopathy of cervical region 01/25/2020   Coronary atherosclerosis of native coronary artery 01/25/2020   Subluxation of peroneal tendon of right foot 11/10/2017   Lumbar radiculopathy, right 06/23/2017   Closed displaced fracture of fifth metatarsal bone of left foot 03/16/2017   GERD (gastroesophageal reflux disease)  12/25/2016   Migraine 05/06/2016   Nonallopathic lesion of lumbosacral region 01/15/2016   Routine general medical examination at a health care facility 11/25/2015   Nonallopathic lesion of thoracic region 09/26/2014   Nonallopathic lesion-rib cage 09/26/2014   Nonallopathic lesion of cervical region 07/19/2014   Hx of osteopenia 10/11/2012   Seasonal allergic rhinitis 03/21/2012   Fibromyalgia 08/12/2011   Palpitations 04/15/2011   DEPRESSIVE DISORDER NOT ELSEWHERE CLASSIFIED 03/27/2010   Vitamin D deficiency 05/25/2008   Lumbar radiculopathy 04/11/2008   Essential hypertension 03/14/2008   History of cardiovascular disorder 02/02/2008   Hyperlipidemia associated with type 2 diabetes mellitus ([place]) 01/18/2007    Immunization History  Administered Date(s) Administered   Fluad Quad(high Dose 65+) 08/11/2019, 11/27/2020   Influenza Split 10/13/2011, 10/11/2012   Influenza Whole 11/17/2007, 09/26/2010   Influenza, High Dose Seasonal PF 09/10/2016, 09/30/2017, 09/08/2018   Influenza,inj,Quad PF,6+ Mos 11/07/2013, 08/29/2014, 09/13/2015   PFIZER(Purple Top)SARS-COV-2 Vaccination 02/11/2020, 03/12/2020, 02/06/2021   Pneumococcal Conjugate-13 12/24/2016   Pneumococcal Polysaccharide-23 02/08/2019   Td 06/07/2009    Conditions to be addressed/monitored:  {USCCMDZASSESSMENTOPTIONS:23563}  There are no care plans that you recently  modified to display for this patient.     Medication Assistance: {MEDASSISTANCEINFO:25044}  Compliance/Adherence/Medication fill history: Care Gaps: ***  Patient's preferred pharmacy is:  [id number] [address] [address]Phone: [phone]: [phone] [place]Phone: [phone]: [phone]  Uses pill box? {Yes or If no, why not?:20788} Pt endorses ***%  compliance  Care Plan and Follow Up Patient Decision:  {FOLLOWUP:24991}  Plan: {CM FOLLOW UP PYBWL:89373} ***  Current Barriers:  {pharmacybarriers:24917}  Pharmacist Clinical Goal(s):  Patient will {PHARMACYGOALCHOICES:24921} through collaboration with PharmD and provider.   Interventions: 1:1 collaboration with CHoyt Koch MD regarding development and update of comprehensive plan of care as evidenced by provider attestation and co-signature Inter-disciplinary care team collaboration (see longitudinal plan of care) Comprehensive medication review performed; medication list updated in electronic medical record  Hypertension (BP goal <140/90) -{US controlled/uncontrolled:25276} -Current treatment: Losartan 546m- 1 tablet daily  -Medications previously tried: telmisartan, metoprolol tartrate, propranolol,   -Current home readings: *** -Current dietary habits: *** -Current exercise habits: *** -{ACTIONS;DENIES/REPORTS:21021675::"Denies"} hypotensive/hypertensive symptoms -Educated on {CCM BP Counseling:25124} -Counseled to monitor BP at home ***, document, and provide log at future appointments -{CCMPHARMDINTERVENTION:25122}  Hyperlipidemia: (LDL goal < 70) -{US controlled/uncontrolled:25276} Lab Results  Component Value Date   LDLCALC 96 01/27/2021  -Current treatment: Atorvastatin 408m 1 tablet daily  ASA 69m56m1 tablet daily  -Medications previously tried: ***  -Current dietary patterns: *** -Current exercise habits: *** -Educated on {CCM HLD Counseling:25126} -{CCMPHARMDINTERVENTION:25122}  Diabetes (A1c goal <7%) -{US controlled/uncontrolled:25276} Lab Results  Component Value Date   HGBA1C 6.3 (A) 09/23/2021  -Current medications: Ozempic 0.5mg 23mkly  Basaglar - 10 units daily  -Medications previously tried: ***  -Current home glucose readings fasting glucose: *** post prandial glucose: *** -{ACTIONS;DENIES/REPORTS:21021675::"Denies"}  hypoglycemic/hyperglycemic symptoms -Current meal patterns:  breakfast: ***  lunch: ***  dinner: *** snacks: *** drinks: *** -Current exercise: *** -Educated on {CCM DM COUNSELING:25123} -Counseled to check feet daily and get yearly eye exams -{CCMPHARMDINTERVENTION:25122}  Depression/Anxiety (Goal: ***) -{US controlled/uncontrolled:25276} -Current treatment: Duloxetine 60mg 50mcapsule daily  Buspirone 15mg -44mablet 3 times daily  Lorazepam 0.5mg - 126mblet daily as needed  Trazodone 50mg - 127m tablet at bedtime as needed  -Medications previously tried/failed: *** -PHQ9: *** -GAD7: *** -Connected with *** for mental health support -Educated on {CCM mental health counseling:25127} -{CCMPHARMDINTERVENTION:25122}   GERD (Goal: Prevention/ control of acid reflux) -{US controlled/uncontrolled:25276} -Current treatment  Omeprazole 20mg - 1 25mule daily  -Medications previously tried: ***  -{CCMPHARMDINTERVENTION:25122}  Osteopenia (Goal prevention of disease progression / fractures) -Controlled -Last DEXA Scan: 01/08/2015  T-Score right femoral neck: -2.0  10-year probability of major osteoporotic fracture: 17.3%  10-year probability of hip fracture: 2.7% -Patient is not a candidate for pharmacologic treatment -Current treatment  Vitamin D 50,000 units - 1 capsule weekly  -Medications previously tried: ***  -{Osteoporosis Counseling:23892} -{CCMPHARMDINTERVENTION:25122}   Fibromyalgia (Goal: Pain control) -{US controlled/uncontrolled:25276} -Current treatment  Duloxetine 60mg - 1 c56mle daily  Tizanidine 2mg - 1 tab19m at bedtime  APAP 500mg - 1 tab38mdaily as needed  -Medications previously tried: ***  -{CCMPHARMDINTERVENTION:25122}   Health Maintenance -Vaccine gaps: *** -Current therapy:  *** -Educated on {ccm supplement counseling:25128} -{CCM Patient satisfied:25129} -{CCMPHARMDINTERVENTION:25122}  Patient Goals/Self-Care Activities Patient  will:  - {pharmacypatientgoals:24919}  Follow Up Plan: {CM  FOLLOW UP BHAL:93790}  Chart prep = 30 minutes

## 2021-10-14 ENCOUNTER — Other Ambulatory Visit: Payer: Self-pay | Admitting: Internal Medicine

## 2021-10-20 DIAGNOSIS — Z23 Encounter for immunization: Secondary | ICD-10-CM | POA: Diagnosis not present

## 2021-10-21 DIAGNOSIS — Z20822 Contact with and (suspected) exposure to covid-19: Secondary | ICD-10-CM | POA: Diagnosis not present

## 2021-11-03 ENCOUNTER — Other Ambulatory Visit: Payer: Self-pay | Admitting: Family Medicine

## 2021-11-05 DIAGNOSIS — F410 Panic disorder [episodic paroxysmal anxiety] without agoraphobia: Secondary | ICD-10-CM | POA: Diagnosis not present

## 2021-11-05 DIAGNOSIS — F339 Major depressive disorder, recurrent, unspecified: Secondary | ICD-10-CM | POA: Diagnosis not present

## 2021-11-10 ENCOUNTER — Other Ambulatory Visit: Payer: Self-pay

## 2021-11-10 ENCOUNTER — Ambulatory Visit (INDEPENDENT_AMBULATORY_CARE_PROVIDER_SITE_OTHER): Payer: Medicare Other

## 2021-11-10 DIAGNOSIS — I10 Essential (primary) hypertension: Secondary | ICD-10-CM

## 2021-11-10 DIAGNOSIS — Z794 Long term (current) use of insulin: Secondary | ICD-10-CM

## 2021-11-10 NOTE — Progress Notes (Signed)
Chronic Care Management Pharmacy Note  11/10/2021 Name:  Christine [name] MRN:  [id number] DOB:  1951-11-15  Summary: -Patient admitted to hospital 8/24-8/27 due to bacteremia from sepsis - notes that she remains on cephalexin 253m once daily  - reports that after her admission she has been experiencing times where she feels weak / shaky, feel her HR increase - will subside within 30 seconds after sitting down -ECG was normal at last cardiology appointment  - patient reports that she has not had any issues with hypoglycemia / hypotension - has been told it is likely a remaining symptom from infection  -Patient reports to weight loss of about 40lbs since her admission, because of this her losartan and basaglar has been adjusted  -Reports that blood sugars are averaging ~100 -Has been following with Sports medicine due to left knee pain, reports that since cyst was drained, feels knee pain has almost completely resolved   Recommendations/Changes made from today's visit: -Recommending no changes to medications at this time, patient is going to reapply for ozempic PAP- has filled out and sent forms for her via mail   Subjective: Christine SMileigh Tilleyis an 70y.o. year old female who is a primary patient of CHoyt Koch MD.  The CCM team was consulted for assistance with disease management and care coordination needs.    Engaged with patient by telephone for follow up visit in response to provider referral for pharmacy case management and/or care coordination services.   Consent to Services:  The patient was given the following information about Chronic Care Management services today, agreed to services, and gave verbal consent: 1. CCM service includes personalized support from designated clinical staff supervised by the primary care provider, including individualized plan of care and coordination with other care providers 2. 24/7 contact phone numbers for assistance for urgent  and routine care needs. 3. Service will only be billed when office clinical staff spend 20 minutes or more in a month to coordinate care. 4. Only one practitioner may furnish and bill the service in a calendar month. 5.The patient may stop CCM services at any time (effective at the end of the month) by phone call to the office staff. 6. The patient will be responsible for cost sharing (co-pay) of up to 20% of the service fee (after annual deductible is met). Patient agreed to services and consent obtained.  Patient Care Team: CHoyt Koch MD as PCP - General (Internal Medicine) SBelva Crome MD as PCP - Cardiology (Cardiology) JPieter Partridge DO as Consulting Physician (Neurology) FCharlton Haws [facility] Pharmacist (Pharmacist)  Recent office visits: 08/14/2021 - Dr. CSharlet Salina- hospital follow up (admitted for sepsis due to bacteria from UTI) - patient to finish keflex - stop fluticasone, loratadine, meclizine, and naproxen   Recent consult visits: 10/09/2021 - Dr. STamala Julian- Sports Medicine - Pain of left knee - drained cyst - patient for follow up in 6 weeks - possible MRI to be ordered should pain persist  09/29/2021 - Dr. STamala Julian- Cardiology - losartan decreased to 535mdaily  09/23/2021 - Dr. ElLoanne Drilling Endocrinology - basaglar reduced to 10 units daily  09/18/2021 - Dr. SmTamala Julian- Sports Medicine - pain of left knee - recommended icing regimen and home exercises - f/u in 2-3 weeks  09/02/2021 -Dr. SmTamala Julian Sports Medicine - pain of left knee - noted acute on chronic tear of the medial meniscus  - icing regimen and home exercises  recommended - follow up in 4-6 weeks   Hospital visits: 08/06/2021-08/09/2021 - Admitted to [facility] due to sepsis from UTI - discharged on keflex x 7 days   Objective:  Lab Results  Component Value Date   CREATININE 0.93 08/19/2021   BUN 17 08/19/2021   GFR 61.53 08/19/2021   GFRNONAA >60 08/09/2021   GFRAA >60 06/09/2020   NA 137 08/19/2021   K 3.8  08/19/2021   CALCIUM 9.5 08/19/2021   CO2 27 08/19/2021   GLUCOSE 132 (H) 08/19/2021    Lab Results  Component Value Date/Time   HGBA1C 6.3 (A) 09/23/2021 10:54 AM   HGBA1C 6.1 (A) 06/19/2021 11:08 AM   HGBA1C 6.6 (H) 05/24/2015 11:37 AM   HGBA1C 8.3 (H) 02/22/2015 11:24 AM   GFR 61.53 08/19/2021 03:46 PM   GFR 87.14 08/14/2021 09:10 AM   MICROALBUR 10 04/23/2020 03:47 PM   MICROALBUR 1.1 11/21/2014 10:40 AM   MICROALBUR 0.6 08/08/2014 12:07 PM    Last diabetic Eye exam:  Lab Results  Component Value Date/Time   HMDIABEYEEXA No Retinopathy 05/18/2018 10:01 AM    Last diabetic Foot exam:  No results found for: HMDIABFOOTEX   Lab Results  Component Value Date   CHOL 164 01/27/2021   HDL 45.60 01/27/2021   LDLCALC 96 01/27/2021   TRIG 112.0 01/27/2021   CHOLHDL 4 01/27/2021    Hepatic Function Latest Ref Rng & Units 08/19/2021 08/14/2021 08/08/2021  Total Protein 6.0 - 8.3 g/dL 7.0 7.2 5.6(L)  Albumin 3.5 - 5.2 g/dL 3.8 3.8 2.8(L)  AST 0 - 37 U/L 15 21 14(L)  ALT 0 - 35 U/L 25 37(H) 20  Alk Phosphatase 39 - 117 U/L 70 61 55  Total Bilirubin 0.2 - 1.2 mg/dL 0.3 0.5 0.8  Bilirubin, Direct 0.0 - 0.3 mg/dL - - -    Lab Results  Component Value Date/Time   TSH 1.58 04/21/2021 01:52 PM   TSH 2.23 01/05/2020 11:35 AM   FREET4 0.91 03/13/2011 04:36 PM    CBC Latest Ref Rng & Units 08/19/2021 08/14/2021 08/09/2021  WBC 4.0 - 10.5 K/uL 7.6 6.7 4.5  Hemoglobin 12.0 - 15.0 g/dL 12.1 12.1 10.2(L)  Hematocrit 36.0 - 46.0 % 36.3 36.9 30.3(L)  Platelets 150.0 - 400.0 K/uL 436.0(H) 392.0 127(L)    Lab Results  Component Value Date/Time   VD25OH 43.41 04/21/2021 01:52 PM   VD25OH 48.28 04/23/2020 01:31 PM    Clinical ASCVD: No  The 10-year ASCVD risk score (Arnett DK, et al., 2019) is: 25.8%   Values used to calculate the score:     Age: 70 years     Sex: Female     Is Non-Hispanic African American: No     Diabetic: Yes     Tobacco smoker: No     Systolic Blood Pressure: 767  mmHg     Is BP treated: Yes     HDL Cholesterol: 45.6 mg/dL     Total Cholesterol: 164 mg/dL    Depression screen [facility] 2/9 06/04/2021 04/21/2021 04/23/2020  Decreased Interest 0 0 0  Down, Depressed, Hopeless 0 0 0  PHQ - 2 Score 0 0 0  Some recent data might be hidden    Social History   Tobacco Use  Smoking Status Never  Smokeless Tobacco Never   BP Readings from Last 3 Encounters:  10/09/21 124/78  09/29/21 136/82  09/23/21 128/70   Pulse Readings from Last 3 Encounters:  10/09/21 93  09/29/21 (!) 101  09/23/21 (!) 103   Wt Readings from Last 3 Encounters:  10/09/21 154 lb (69.9 kg)  09/29/21 155 lb (70.3 kg)  09/23/21 153 lb 12.8 oz (69.8 kg)   BMI Readings from Last 3 Encounters:  10/09/21 24.86 kg/m  09/29/21 25.02 kg/m  09/23/21 24.82 kg/m    Assessment/Interventions: Review of patient past medical history, allergies, medications, health status, including review of consultants reports, laboratory and other test data, was performed as part of comprehensive evaluation and provision of chronic care management services.   SDOH:  (Social Determinants of Health) assessments and interventions performed: Yes  SDOH Screenings   Alcohol Screen: Low Risk    Last Alcohol Screening Score (AUDIT): 0  Depression (PHQ2-9): Low Risk    PHQ-2 Score: 0  Financial Resource Strain: Low Risk    Difficulty of Paying Living Expenses: Not hard at all  Food Insecurity: No Food Insecurity   Worried About Charity fundraiser in the Last Year: Never true   Ran Out of Food in the Last Year: Never true  Housing: Low Risk    Last Housing Risk Score: 0  Physical Activity: Inactive   Days of Exercise per Week: 0 days   Minutes of Exercise per Session: 0 min  Social Connections: Engineer, building services of Communication with Friends and Family: More than three times a week   Frequency of Social Gatherings with Friends and Family: More than three times a week   Attends Religious  Services: More than 4 times per year   Active Member of [employer] or Organizations: Yes   Attends Music therapist: More than 4 times per year   Marital Status: Married  Stress: No Stress Concern Present   Feeling of Stress : Not at all  Tobacco Use: Low Risk    Smoking Tobacco Use: Never   Smokeless Tobacco Use: Never   Passive Exposure: Not on file  Transportation Needs: No Transportation Needs   Lack of Transportation (Medical): No   Lack of Transportation (Non-Medical): No    CCM Care Plan  Allergies  Allergen Reactions   Vioxx [Rofecoxib] Other (See Comments)    Excess BP which caused TIA    Prednisone Other (See Comments)    Mental status changes with high-dose oral agent. Tolerates epidural steroid injections. No associated rash or fever   Macrodantin [Nitrofurantoin Macrocrystal] Hives   Sulfa Antibiotics     Hives   Colesevelam Other (See Comments)    Leg Pain    Medications Reviewed Today     Reviewed by Tomasa Blase, [facility] (Pharmacist) on 11/10/21 at 1325  Med List Status: <None>   Medication Order Taking? Sig Documenting Provider Last Dose Status Informant  acetaminophen (TYLENOL) 500 MG tablet [id number] Yes Take 500 mg by mouth as needed. [provider] Taking Active   aspirin 81 MG tablet [id number] Yes Take 81 mg by mouth daily. [provider] Taking Active Spouse/Significant Other  atorvastatin (LIPITOR) 40 MG tablet [id number] Yes Take 1 tablet (40 mg total) by mouth daily. [name], MD Taking Active Spouse/Significant Other           Med Note Trina Ao Aug 14, 2021  8:38 AM)    BD PEN NEEDLE NANO 2ND GEN 32G X 4 MM MISC [id number] Yes USE ONCE A DAY AS DIRECTED Renato Shin, MD Taking Active Spouse/Significant Other  busPIRone (BUSPAR) 15 MG tablet [id number] Yes Take 15 mg by mouth 3 (three)  times daily. [provider] Taking Active   cephALEXin (KEFLEX) 250 MG capsule [id number] Yes Take 250  mg by mouth daily. [provider] Taking Active   DULoxetine (CYMBALTA) 60 MG capsule [id number] Yes Take 60 mg by mouth daily. [provider] Taking Active Spouse/Significant Other           Med Note Thomes Cake   Thu Aug 14, 2021  8:38 AM)    Insulin Glargine [facility] KWIKPEN) 100 UNIT/ML [id number] Yes Inject 10 Units into the skin every morning. Renato Shin, MD Taking Active   Lancets [facility] ULTRASOFT) lancets [id number] Yes Check blood sugar once daily. Dx code: 49.00 Hendricks Limes, MD Taking Active Spouse/Significant Other           Med Note Blanch Media, CYNTHIA A   Mon Apr 21, 2021  1:09 PM) use  LORazepam (ATIVAN) 0.5 MG tablet [id number] Yes Take 0.5 mg by mouth as needed for anxiety. [provider] Taking Active Spouse/Significant Other           Med Note Thomes Cake   Thu Aug 14, 2021  8:36 AM)    losartan (COZAAR) 50 MG tablet [id number] Yes Take 1 tablet (50 mg total) by mouth daily. Belva Crome, MD Taking Active   omeprazole (PRILOSEC) 20 MG capsule [id number] Yes TAKE 1 CAPSULE BY MOUTH EVERY DAY [name], MD Taking Active   [facility] ULTRA test strip [id number] Yes USE 1 STRIP TWICE DAILY DX CODE E11.65 Renato Shin, MD Taking Active Spouse/Significant Other  promethazine (PHENERGAN) 12.5 MG tablet [id number] Yes Take 1 tablet (12.5 mg total) by mouth every 8 (eight) hours as needed for nausea or vomiting. [name], MD Taking Active Spouse/Significant Other  Semaglutide,0.25 or 0.5MG/DOS, (OZEMPIC, 0.25 OR 0.5 MG/DOSE,) 2 MG/1.5ML SOPN [id number] Yes Inject 0.5 mg into the skin once a week.  Patient taking differently: Inject 0.5 mg into the skin once a week. Tuesday   Renato Shin, MD Taking Active   tiZANidine (ZANAFLEX) 2 MG tablet [id number] Yes TAKE 1 TABLET BY MOUTH AT BEDTIME. Lyndal Pulley, DO Taking Active   traZODone (DESYREL) 50 MG tablet [id number] Yes TAKE 1/2 TO 1 TABLET BY MOUTH AT BEDTIME AS NEEDED FOR  SLEEP  Patient taking differently: Take 50 mg by mouth as needed.   [name], MD Taking Active   vitamin B-12 (CYANOCOBALAMIN) 1000 MCG tablet [id number] Yes Take 1,000 mcg by mouth daily. [provider] Taking Active   Vitamin D, Ergocalciferol, (DRISDOL) 1.25 MG (50000 UNIT) CAPS capsule [id number] Yes TAKE 1 CAPSULE BY MOUTH ONE TIME PER WEEK Lyndal Pulley, DO Taking Active             Patient Active Problem List   Diagnosis Date Noted   Baker's cyst of knee, left 10/09/2021   Acute medial meniscus tear of left knee 09/02/2021   Bacteremia 08/15/2021   Bacteria in urine 08/07/2021   Urinary hesitancy 08/07/2021   Elevated troponin 08/07/2021   Diabetes mellitus type 2 in nonobese ([place]) 08/07/2021   Tachycardia 08/07/2021   Sepsis ([place]) 08/06/2021   Dizziness 04/22/2021   Arthritis of right hip 02/12/2021   Leg swelling 07/12/2020   Cervical disc disorder with radiculopathy of cervical region 01/25/2020   Coronary atherosclerosis of native coronary artery 01/25/2020   Subluxation of peroneal tendon of right foot 11/10/2017   Lumbar radiculopathy, right 06/23/2017   Closed displaced fracture of fifth metatarsal bone of left foot 03/16/2017  GERD (gastroesophageal reflux disease) 12/25/2016   Migraine 05/06/2016   Nonallopathic lesion of lumbosacral region 01/15/2016   Routine general medical examination at a health care facility 11/25/2015   Nonallopathic lesion of thoracic region 09/26/2014   Nonallopathic lesion-rib cage 09/26/2014   Nonallopathic lesion of cervical region 07/19/2014   Hx of osteopenia 10/11/2012   Seasonal allergic rhinitis 03/21/2012   Fibromyalgia 08/12/2011   Palpitations 04/15/2011   DEPRESSIVE DISORDER NOT ELSEWHERE CLASSIFIED 03/27/2010   Vitamin D deficiency 05/25/2008   Lumbar radiculopathy 04/11/2008   Essential hypertension 03/14/2008   History of cardiovascular disorder 02/02/2008   Hyperlipidemia associated with  type 2 diabetes mellitus ([place]) 01/18/2007    Immunization History  Administered Date(s) Administered   Fluad Quad(high Dose 65+) 08/11/2019, 11/27/2020   Influenza Split 10/13/2011, 10/11/2012   Influenza Whole 11/17/2007, 09/26/2010   Influenza, High Dose Seasonal PF 09/10/2016, 09/30/2017, 09/08/2018   Influenza,inj,Quad PF,6+ Mos 11/07/2013, 08/29/2014, 09/13/2015   PFIZER(Purple Top)SARS-COV-2 Vaccination 02/11/2020, 03/12/2020, 02/06/2021   Pneumococcal Conjugate-13 12/24/2016   Pneumococcal Polysaccharide-23 02/08/2019   Td 06/07/2009    Conditions to be addressed/monitored:  Hypertension, Hyperlipidemia and Diabetes  Care Plan : [place]  Updates made by Tomasa Blase, [place] since 11/10/2021 12:00 AM     Problem: Hypertension, Hyperlipidemia and Diabetes   Priority: High     Long-Range Goal: Disease management   Start Date: 01/31/2021  Expected End Date: 07/31/2021  Recent Progress: On track  Priority: High  Note:   Current Barriers:  Unable to independently afford treatment regimen Unable to independently monitor therapeutic efficacy  Pharmacist Clinical Goal(s):  patient will verbalize ability to afford treatment regimen achieve adherence to monitoring guidelines and medication adherence to achieve therapeutic efficacy through collaboration with PharmD and provider.   Interventions: 1:1 collaboration with [name], MD regarding development and update of comprehensive plan of care as evidenced by provider attestation and co-signature Inter-disciplinary care team collaboration (see longitudinal plan of care) Comprehensive medication review performed; medication list updated in electronic medical record  Hypertension (BP goal < 130/80) Controlled Current regimen:  Losartan 50 mg daily Interventions: Discussed BP goals and benefits of medications for prevention of heart attack / stroke Discussed white coat syndrome and importance of  checking BP at home Patient self care activities Check BP 1-2 times weekly, document, and provide at future appointments Ensure daily salt intake < 2300 mg/day   Hyperlipidemia (LDL goal < 100) Controlled Lab Results  Component Value Date   LDLCALC 96 01/27/2021  Current regimen:  Atorvastatin 80 mg daily Aspirin 81 mg daily Interventions: Discussed cholesterol goals and benefits of medications for prevention of heart attack / stroke Discussed foods high in cholesterol to avoid Patient self care activities -patient will: Continue current medications Reduce cholesterol in diet   Diabetes (A1c goal < 7%) Controlled  Lab Results  Component Value Date   HGBA1C 6.3 (A) 09/23/2021  Current regimen:  Basaglar 10 units daily Ozempic 0.61m weekly  Interventions: Discussed blood sugar goals and benefits of medications for prevention of heart attack / stroke Ozempic PAP was approved 01/15/21-12/13/21. Renewal form sent via mail 11/10/2021 Patient self care activities   Check blood sugar once daily, document, and provide at future appointments Contact provider with any episodes of hypoglycemia  Patient Goals/Self-Care Activities - take medications as prescribed focus on medication adherence by pill box check glucose daily, document, and provide at future appointments  Follow Up Plan: Telephone follow up appointment with care management team  member scheduled for: 3 months       Medication Assistance: Application for ozempic  medication assistance program. in process.  Anticipated assistance start date 12/14/2021.  See plan of care for additional detail.  Compliance/Adherence/Medication fill history: Care Gaps: Shingles, Tdap, COVID booster, Mammogram, Ophthalmology Exam, and Colonoscopy   Patient's preferred pharmacy is:  [id number] [place], [place] [address]Phone: [phone]: [phone] [place] [address] [id number]: [phone]: [phone]  Uses pill box? Yes Pt endorses 100% compliance  Care Plan and Follow Up Patient Decision:  Patient agrees to Care Plan and Follow-up.  Plan: Telephone follow up appointment with care management team member scheduled for:  3 months  The patient has been provided with contact information for the care management team and has been advised to call with any health related questions or concerns.   DTomasa Blase PharmD Clinical Pharmacist, [place]

## 2021-11-10 NOTE — Patient Instructions (Signed)
Visit Information  Following are the goals we discussed today:   Manage My Medication  Timeframe:  Long-Range Goal Priority:  Medium Start Date: 11/10/2021                            Expected End Date:  11/10/2022                     Follow Up Date 02/11/2022   - call for medicine refill 2 or 3 days before it runs out - keep a list of all the medicines I take; vitamins and herbals too - learn to read medicine labels - use a pillbox to sort medicine    Why is this important?   These steps will help you keep on track with your medicines.  Plan: Telephone follow up appointment with care management team member scheduled for:  3 months The patient has been provided with contact information for the care management team and has been advised to call with any health related questions or concerns.   Tomasa Blase, PharmD Clinical Pharmacist, [facility]   Please call the care guide team at [phone] if you need to cancel or reschedule your appointment.   Patient verbalizes understanding of instructions provided today and agrees to view in [place].

## 2021-11-12 DIAGNOSIS — Z794 Long term (current) use of insulin: Secondary | ICD-10-CM | POA: Diagnosis not present

## 2021-11-12 DIAGNOSIS — E785 Hyperlipidemia, unspecified: Secondary | ICD-10-CM

## 2021-11-12 DIAGNOSIS — E1159 Type 2 diabetes mellitus with other circulatory complications: Secondary | ICD-10-CM | POA: Diagnosis not present

## 2021-11-12 DIAGNOSIS — I10 Essential (primary) hypertension: Secondary | ICD-10-CM | POA: Diagnosis not present

## 2021-11-18 NOTE — Progress Notes (Signed)
[facility] [place] Phone: [phone] Subjective:   [name], am serving as a scribe for Dr. Hulan Saas.  This visit occurred during the SARS-CoV-2 public health emergency.  Safety protocols were in place, including screening questions prior to the visit, additional usage of staff PPE, and extensive cleaning of exam room while observing appropriate contact time as indicated for disinfecting solutions.   I'm seeing this patient by the request  of:  [name], MD  CC: Left knee pain, back and neck pain  [id number]  10/09/2021 Patient continues to be symptomatic.  We will continue to monitor.  Worsening instability of the knee or catching we will need to consider advanced imaging.  Patient will follow-up again in 6 weeks with continued conservative therapy.  Updated 11/19/2021 Ciarrah Gloriana Piltz is a 70 y.o. female coming in with complaint of L knee pain. Patient states that her knee pain has improved. Here for burning sensation in L cervical spine that radiates into L shoulder. Occurring intermittently for past 3 weeks. Using Tylenol for pain.      Past Medical History:  Diagnosis Date   Acute bursitis of right shoulder 08/29/2018   Right shoulder injected in August 29, 2018   Allergic rhinitis    Closed displaced fracture of fifth metatarsal bone of left foot 03/16/2017   DEPRESSIVE DISORDER NOT ELSEWHERE CLASSIFIED 03/27/2010   Qualifier: Diagnosis of  By: [name] MD, [name]   [name], NP , [facility] [place]     Diabetes ([place]) 04/02/2016   Diabetes mellitus    Essential hypertension 03/14/2008   Qualifier: Diagnosis of  By: [name] MD, [name]     Fibromyalgia 08/12/2011   Diagnosed by [name] , Rheumatologist 2010    GERD (gastroesophageal reflux disease) 12/25/2016   Gluteal tendonitis of right buttock 10/19/2017   Injected in October 19, 2017 Injected October 07, 2018   Greater trochanteric bursitis of left hip 06/17/2016   Greater trochanteric bursitis of right hip 11/27/2015   Injected 11/27/2015    History of recurrent UTIs    [name]   HTN (hypertension)    Hx of osteopenia 10/11/2012   [facility]; ordered by [name] Lowest T score: -2.1 @ lumbar spine @ [facility] 11/01/12; decrease of 8.5% vs 05/2009. There is 9.2% risk over 10 yrs History fracture left elbow @ 4 Fractured left wrist , right elbow and nose post fall July/18/2013. Dr. [name] No FH Osteoporosis No PMH of bisphosphonate therapy (generic Fosamax Rxed but not filled  due to polypharmacy )    Hyperlipidemia    Hyperlipidemia associated with type 2 diabetes mellitus ([place]) 01/18/2007   Qualifier: Diagnosis of  By: [name]  With DM LDL goal = < 100, ideally < 70. Mi in father @ 9; 2 brothers @ 51 & 17     Lumbar radiculopathy 04/11/2008   Qualifier: Diagnosis of  By: [name] MD, Erick Blinks well to epidural 01/12/2017  repeat epidural given May 2018   Migraine headache    quiescent   Nonallopathic lesion of cervical region 07/19/2014   Nonallopathic lesion of lumbosacral region 01/15/2016   Nonallopathic lesion of sacral region 06/27/2018   Nonallopathic lesion-rib cage 09/26/2014   Palpitations 04/15/2011   Rotator cuff impingement syndrome of left shoulder 05/11/2014   Seasonal allergic rhinitis 03/21/2012   Sinusitis, chronic 04/20/2016   Sleep disorder    [name] May  Subluxation of peroneal tendon of right foot 11/10/2017   TIA (transient ischemic attack)    TRANSIENT ISCHEMIC ATTACKS, HX OF 02/02/2008   Qualifier: Diagnosis of  By: Donalee Citrin ADVRS EFF UNS RX MEDICINAL&BIOLOGICAL [place] 02/02/2008   Qualifier: Diagnosis of  By: [name] MD, Posey Pronto AND MYOSITIS 02/02/2008   Qualifier: Diagnosis of  By: [name] MD, [name]     UTI (urinary tract infection) 10/05/2014   Viral upper respiratory tract infection with cough 12/31/2014    Vitamin D deficiency 05/25/2008   Qualifier: Diagnosis of  By: [name] MD, [name]     Past Surgical History:  Procedure Laterality Date   COLONOSCOPY     Dr Carlean [name]; hemorrhoids   CORONARY ANGIOPLASTY  1997   for chest pain- negative    POLYPECTOMY  2002   benign, hyperplastic polyp; rectal bleeding 2008   TONSILLECTOMY     TOTAL ABDOMINAL HYSTERECTOMY  1983   BSO for endometriosis   WISDOM TOOTH EXTRACTION     Social History   Socioeconomic History   Marital status: Married    Spouse name: Not on file   Number of children: Not on file   Years of education: Not on file   Highest education level: Not on file  Occupational History   Occupation: Research scientist (physical sciences)     Employer: [employer]  Tobacco Use   Smoking status: Never   Smokeless tobacco: Never  Vaping Use   Vaping Use: Never used  Substance and Sexual Activity   Alcohol use: No   Drug use: No   Sexual activity: Not on file  Other Topics Concern   Not on file  Social History Narrative   Regular exercise- no    Social Determinants of Health   Financial Resource Strain: Low Risk    Difficulty of Paying Living Expenses: Not hard at all  Food Insecurity: No Food Insecurity   Worried About Charity fundraiser in the Last Year: Never true   [place] in the Last Year: Never true  Transportation Needs: No Transportation Needs   Lack of Transportation (Medical): No   Lack of Transportation (Non-Medical): No  Physical Activity: Inactive   Days of Exercise per Week: 0 days   Minutes of Exercise per Session: 0 min  Stress: No Stress Concern Present   Feeling of Stress : Not at all  Social Connections: Socially Integrated   Frequency of Communication with Friends and Family: More than three times a week   Frequency of Social Gatherings with Friends and Family: More than three times a week   Attends Religious Services: More than 4 times per year   Active Member of [employer] or Organizations: Yes    Attends Music therapist: More than 4 times per year   Marital Status: Married   Allergies  Allergen Reactions   Vioxx [Rofecoxib] Other (See Comments)    Excess BP which caused TIA    Prednisone Other (See Comments)    Mental status changes with high-dose oral agent. Tolerates epidural steroid injections. No associated rash or fever   Macrodantin [Nitrofurantoin Macrocrystal] Hives   Sulfa Antibiotics     Hives   Colesevelam Other (See Comments)    Leg Pain   Family History  Problem Relation Age of Onset   Colon cancer Maternal Aunt    Diabetes Paternal Uncle    Heart attack Father 99   COPD Mother  Lung cancer Brother        smoker   Mental illness Other        niece, committed suicide.   Heart attack Other        brother X 2; @ 14 & 29   Stroke Neg Hx     Current Outpatient Medications (Endocrine & Metabolic):    Insulin Glargine (BASAGLAR KWIKPEN) 100 UNIT/ML, Inject 10 Units into the skin every morning.   Semaglutide,0.25 or 0.5MG /DOS, (OZEMPIC, 0.25 OR 0.5 MG/DOSE,) 2 MG/1.5ML SOPN, Inject 0.5 mg into the skin once a week. (Patient taking differently: Inject 0.5 mg into the skin once a week. Tuesday)  Current Outpatient Medications (Cardiovascular):    atorvastatin (LIPITOR) 40 MG tablet, Take 1 tablet (40 mg total) by mouth daily.   losartan (COZAAR) 50 MG tablet, Take 1 tablet (50 mg total) by mouth daily.  Current Outpatient Medications (Respiratory):    promethazine (PHENERGAN) 12.5 MG tablet, Take 1 tablet (12.5 mg total) by mouth every 8 (eight) hours as needed for nausea or vomiting.  Current Outpatient Medications (Analgesics):    acetaminophen (TYLENOL) 500 MG tablet, Take 500 mg by mouth as needed.   aspirin 81 MG tablet, Take 81 mg by mouth daily.  Current Outpatient Medications (Hematological):    vitamin B-12 (CYANOCOBALAMIN) 1000 MCG tablet, Take 1,000 mcg by mouth daily.  Current Outpatient Medications (Other):    acyclovir  (ZOVIRAX) 400 MG tablet, Take 1 tablet (400 mg total) by mouth 3 (three) times daily.   BD PEN NEEDLE NANO 2ND GEN 32G X 4 MM MISC, USE ONCE A DAY AS DIRECTED   busPIRone (BUSPAR) 15 MG tablet, Take 15 mg by mouth 3 (three) times daily.   cephALEXin (KEFLEX) 250 MG capsule, Take 250 mg by mouth daily.   DULoxetine (CYMBALTA) 60 MG capsule, Take 60 mg by mouth daily.   Lancets (ONETOUCH ULTRASOFT) lancets, Check blood sugar once daily. Dx code: 250.00   LORazepam (ATIVAN) 0.5 MG tablet, Take 0.5 mg by mouth as needed for anxiety.   omeprazole (PRILOSEC) 20 MG capsule, TAKE 1 CAPSULE BY MOUTH EVERY DAY   ONETOUCH ULTRA test strip, USE 1 STRIP TWICE DAILY DX CODE E11.65   tiZANidine (ZANAFLEX) 2 MG tablet, TAKE 1 TABLET BY MOUTH AT BEDTIME.   traZODone (DESYREL) 50 MG tablet, TAKE 1/2 TO 1 TABLET BY MOUTH AT BEDTIME AS NEEDED FOR SLEEP (Patient taking differently: Take 50 mg by mouth as needed.)   Vitamin D, Ergocalciferol, (DRISDOL) 1.25 MG (50000 UNIT) CAPS capsule, TAKE 1 CAPSULE BY MOUTH ONE TIME PER WEEK   Reviewed prior external information including notes and imaging from  primary care provider As well as notes that were available from care everywhere and other healthcare systems.  Reviewed patient's most recent visit with endocrinology.  Past medical history, social, surgical and family history all reviewed in electronic medical record.  No pertanent information unless stated regarding to the chief complaint.   Review of Systems:  No  visual changes, nausea, vomiting, diarrhea, constipation, dizziness, abdominal pain, skin rash, fevers, chills, night sweats, swollen lymph nodes, body aches, joint swelling, chest pain, shortness of breath, mood changes. POSITIVE muscle aches, headache increasing stress, weight loss  Objective  Blood pressure 132/82, pulse 88, height 5\' 6"  (1.676 m), weight 157 lb (71.2 kg), SpO2 98 %.   General: No apparent distress alert and oriented x3 mood and affect  normal, dressed appropriately.  HEENT: Pupils equal, extraocular movements intact  Respiratory: Patient's speak  in full sentences and does not appear short of breath  Cardiovascular: No lower extremity edema, non tender, no erythema  Gait normal with good balance and coordination.  MSK:  severe neck pain even to light sensation on the occipital region.  Patient does have some tenderness and then to light sensation on the Area area.  No erythema noted. Patient does have tightness noted in the upper back on the left side more than the right.  Seems to be in the parascapular region. Low back exam tenderness noted in the upper region of the lumbar spine on left side more tenderness noted on the right side.   Osteopathic findings T3 extended rotated and side bent right inhaled third rib T9 extended rotated and side bent left L2 flexed rotated and side bent left Sacrum right on right    Impression and Recommendations:     The above documentation has been reviewed and is accurate and complete Lyndal Pulley, DO

## 2021-11-19 ENCOUNTER — Ambulatory Visit (INDEPENDENT_AMBULATORY_CARE_PROVIDER_SITE_OTHER): Payer: Medicare Other | Admitting: Family Medicine

## 2021-11-19 ENCOUNTER — Other Ambulatory Visit: Payer: Self-pay

## 2021-11-19 ENCOUNTER — Ambulatory Visit: Payer: Self-pay

## 2021-11-19 VITALS — BP 132/82 | HR 88 | Ht 66.0 in | Wt 157.0 lb

## 2021-11-19 DIAGNOSIS — M9904 Segmental and somatic dysfunction of sacral region: Secondary | ICD-10-CM | POA: Diagnosis not present

## 2021-11-19 DIAGNOSIS — M9908 Segmental and somatic dysfunction of rib cage: Secondary | ICD-10-CM | POA: Diagnosis not present

## 2021-11-19 DIAGNOSIS — M25562 Pain in left knee: Secondary | ICD-10-CM

## 2021-11-19 DIAGNOSIS — M9902 Segmental and somatic dysfunction of thoracic region: Secondary | ICD-10-CM

## 2021-11-19 DIAGNOSIS — M9903 Segmental and somatic dysfunction of lumbar region: Secondary | ICD-10-CM

## 2021-11-19 DIAGNOSIS — I2510 Atherosclerotic heart disease of native coronary artery without angina pectoris: Secondary | ICD-10-CM | POA: Diagnosis not present

## 2021-11-19 DIAGNOSIS — M5010 Cervical disc disorder with radiculopathy, unspecified cervical region: Secondary | ICD-10-CM | POA: Diagnosis not present

## 2021-11-19 DIAGNOSIS — M999 Biomechanical lesion, unspecified: Secondary | ICD-10-CM | POA: Diagnosis not present

## 2021-11-19 MED ORDER — ACYCLOVIR 400 MG PO TABS: 400.0000 mg | ORAL_TABLET | Freq: Three times a day (TID) | ORAL | 0 refills | Status: DC

## 2021-11-19 NOTE — Assessment & Plan Note (Signed)
Pain out of proportion.  Even light sensation.  Concerned that patient may have possibility of shingles occurring.  Given acyclovir and we will see how patient responds.  Avoided any type of manipulation of the neck did manipulate the upper back as well as the lower back.  Patient does have muscle relaxer and encouraged her to take it at night if you are having worsening pain.

## 2021-11-19 NOTE — Assessment & Plan Note (Signed)
   Decision today to treat with OMT was based on Physical Exam  After verbal consent patient was treated with HVLA, ME, FPR techniques in  thoracic, rib, lumbar and sacral areas, all areas are chronic   Patient tolerated the procedure well with improvement in symptoms  Patient given exercises, stretches and lifestyle modifications  See medications in patient instructions if given  Patient will follow up in 4-8 weeks 

## 2021-11-19 NOTE — Patient Instructions (Signed)
Always great to see you  Did not touch the neck today  Try the acyclovir and see if that helps Otherwise watch for any worsening headache and then see me or seek medical attention but I am pretty sure you will do fine  See me again in 6 weeks

## 2021-11-24 ENCOUNTER — Other Ambulatory Visit: Payer: Self-pay | Admitting: Family Medicine

## 2021-11-25 ENCOUNTER — Ambulatory Visit: Payer: Medicare Other | Admitting: Endocrinology

## 2021-11-26 ENCOUNTER — Ambulatory Visit: Payer: Medicare Other | Admitting: Family Medicine

## 2021-12-14 HISTORY — PX: KNEE ARTHROSCOPY: SUR90

## 2021-12-16 ENCOUNTER — Encounter: Payer: Self-pay | Admitting: Internal Medicine

## 2021-12-18 ENCOUNTER — Other Ambulatory Visit: Payer: Self-pay

## 2021-12-18 ENCOUNTER — Ambulatory Visit: Payer: Medicare Other | Admitting: Endocrinology

## 2021-12-18 ENCOUNTER — Ambulatory Visit (INDEPENDENT_AMBULATORY_CARE_PROVIDER_SITE_OTHER): Payer: Medicare Other | Admitting: Endocrinology

## 2021-12-18 VITALS — BP 152/90 | HR 93 | Ht 66.0 in | Wt 151.8 lb

## 2021-12-18 DIAGNOSIS — Z794 Long term (current) use of insulin: Secondary | ICD-10-CM | POA: Diagnosis not present

## 2021-12-18 DIAGNOSIS — E1151 Type 2 diabetes mellitus with diabetic peripheral angiopathy without gangrene: Secondary | ICD-10-CM

## 2021-12-18 LAB — POCT GLYCOSYLATED HEMOGLOBIN (HGB A1C): Hemoglobin A1C: 7.1 % — AB (ref 4.0–5.6)

## 2021-12-18 MED ORDER — OZEMPIC (2 MG/DOSE) 8 MG/3ML ~~LOC~~ SOPN: 2.0000 mg | PEN_INJECTOR | SUBCUTANEOUS | 3 refills | Status: DC

## 2021-12-18 NOTE — Progress Notes (Signed)
Subjective:    Patient ID: Christine [name], female    DOB: [birth year], 71 y.o.   MRN: [id number]  HPI Pt returns for f/u of diabetes mellitus:  DM type: Insulin-requiring type 2 Dx'ed: 8366 Complications: TIA Therapy: insulin since 2014, and Ozempic.   GDM: never.   DKA: never.   Severe hypoglycemia: never.  Pancreatitis: never.   Other: She declines multiple daily injections; pioglitizone was for NASH, but she stopped due to edema; a trial to convert back to oral rx in 2017 was unsuccessful; she declines additional DM rx.   Interval history: she brings a record of her cbg's which I have reviewed today.  Cbg varies from 86-216.  She takes meds as rx'ed.   Past Medical History:  Diagnosis Date   Acute bursitis of right shoulder 08/29/2018   Right shoulder injected in August 29, 2018   Allergic rhinitis    Closed displaced fracture of fifth metatarsal bone of left foot 03/16/2017   DEPRESSIVE DISORDER NOT ELSEWHERE CLASSIFIED 03/27/2010   Qualifier: Diagnosis of  By: [name] MD, [name]   [name], NP , [facility] [place]     Diabetes ([place]) 04/02/2016   Diabetes mellitus    Essential hypertension 03/14/2008   Qualifier: Diagnosis of  By: [name] MD, [name]     Fibromyalgia 08/12/2011   Diagnosed by [name] , Rheumatologist 2010    GERD (gastroesophageal reflux disease) 12/25/2016   Gluteal tendonitis of right buttock 10/19/2017   Injected in October 19, 2017 Injected October 07, 2018   Greater trochanteric bursitis of left hip 06/17/2016   Greater trochanteric bursitis of right hip 11/27/2015   Injected 11/27/2015    History of recurrent UTIs    [name]   HTN (hypertension)    Hx of osteopenia 10/11/2012   [facility]EXA; ordered by [name] Lowest T score: -2.1 @ lumbar spine @ [facility] 11/01/12; decrease of 8.5% vs 05/2009. There is 9.2% risk over 10 yrs History fracture left elbow @ 4 Fractured left wrist , right elbow and nose post fall July/18/2013.  Dr. [name] No FH Osteoporosis No PMH of bisphosphonate therapy (generic Fosamax Rxed but not filled  due to polypharmacy )    Hyperlipidemia    Hyperlipidemia associated with type 2 diabetes mellitus ([place]) 01/18/2007   Qualifier: Diagnosis of  By: [name]  With DM LDL goal = < 100, ideally < 70. Mi in father @ 10; 2 brothers @ 38 & 54     Lumbar radiculopathy 04/11/2008   Qualifier: Diagnosis of  By: [name] MD, Erick Blinks well to epidural 01/12/2017  repeat epidural given May 2018   Migraine headache    quiescent   Nonallopathic lesion of cervical region 07/19/2014   Nonallopathic lesion of lumbosacral region 01/15/2016   Nonallopathic lesion of sacral region 06/27/2018   Nonallopathic lesion-rib cage 09/26/2014   Palpitations 04/15/2011   Rotator cuff impingement syndrome of left shoulder 05/11/2014   Seasonal allergic rhinitis 03/21/2012   Sinusitis, chronic 04/20/2016   Sleep disorder    Dr Dohmier   Subluxation of peroneal tendon of right foot 11/10/2017   TIA (transient ischemic attack)    [place], HX OF 02/02/2008   Qualifier: Diagnosis of  By: Donalee Citrin ADVRS EFF UNS RX MEDICINAL&BIOLOGICAL [place] 02/02/2008   Qualifier: Diagnosis of  By: [name] MD, Posey Pronto AND MYOSITIS 02/02/2008   Qualifier: Diagnosis  of  By: [name] MD, [name]     UTI (urinary tract infection) 10/05/2014   Viral upper respiratory tract infection with cough 12/31/2014   Vitamin D deficiency 05/25/2008   Qualifier: Diagnosis of  By: [name] MD, [name]      Past Surgical History:  Procedure Laterality Date   COLONOSCOPY     Dr Carlean [name]; hemorrhoids   CORONARY ANGIOPLASTY  1997   for chest pain- negative    POLYPECTOMY  2002   benign, hyperplastic polyp; rectal bleeding 2008   TONSILLECTOMY     TOTAL ABDOMINAL HYSTERECTOMY  1983   BSO for endometriosis   WISDOM TOOTH EXTRACTION      Social History   Socioeconomic History   Marital status:  Married    Spouse name: Not on file   Number of children: Not on file   Years of education: Not on file   Highest education level: Not on file  Occupational History   Occupation: Research scientist (physical sciences)     Employer: [employer]  Tobacco Use   Smoking status: Never   Smokeless tobacco: Never  Vaping Use   Vaping Use: Never used  Substance and Sexual Activity   Alcohol use: No   Drug use: No   Sexual activity: Not on file  Other Topics Concern   Not on file  Social History Narrative   Regular exercise- no    Social Determinants of Health   Financial Resource Strain: Low Risk    Difficulty of Paying Living Expenses: Not hard at all  Food Insecurity: No Food Insecurity   Worried About Charity fundraiser in the Last Year: Never true   [place] in the Last Year: Never true  Transportation Needs: No Transportation Needs   Lack of Transportation (Medical): No   Lack of Transportation (Non-Medical): No  Physical Activity: Inactive   Days of Exercise per Week: 0 days   Minutes of Exercise per Session: 0 min  Stress: No Stress Concern Present   Feeling of Stress : Not at all  Social Connections: Socially Integrated   Frequency of Communication with Friends and Family: More than three times a week   Frequency of Social Gatherings with Friends and Family: More than three times a week   Attends Religious Services: More than 4 times per year   Active Member of [employer] or Organizations: Yes   Attends Music therapist: More than 4 times per year   Marital Status: Married  Human resources officer Violence: Not At Risk   Fear of Current or Ex-Partner: No   Emotionally Abused: No   Physically Abused: No   Sexually Abused: No    Current Outpatient Medications on File Prior to Visit  Medication Sig Dispense Refill   acetaminophen (TYLENOL) 500 MG tablet Take 500 mg by mouth as needed.     acyclovir (ZOVIRAX) 400 MG tablet Take 1 tablet (400 mg total) by mouth 3  (three) times daily. 15 tablet 0   aspirin 81 MG tablet Take 81 mg by mouth daily.     atorvastatin (LIPITOR) 40 MG tablet Take 1 tablet (40 mg total) by mouth daily. 90 tablet 3   BD PEN NEEDLE NANO 2ND GEN 32G X 4 MM MISC USE ONCE A DAY AS DIRECTED 100 each 3   busPIRone (BUSPAR) 15 MG tablet Take 15 mg by mouth 3 (three) times daily.     cephALEXin (KEFLEX) 250 MG capsule Take 250 mg by mouth daily.  DULoxetine (CYMBALTA) 60 MG capsule Take 60 mg by mouth daily.     Lancets (ONETOUCH ULTRASOFT) lancets Check blood sugar once daily. Dx code: 250.00 100 each 12   LORazepam (ATIVAN) 0.5 MG tablet Take 0.5 mg by mouth as needed for anxiety.     losartan (COZAAR) 50 MG tablet Take 1 tablet (50 mg total) by mouth daily. 90 tablet 3   omeprazole (PRILOSEC) 20 MG capsule TAKE 1 CAPSULE BY MOUTH EVERY DAY 90 capsule 2   ONETOUCH ULTRA test strip USE 1 STRIP TWICE DAILY DX CODE E11.65 100 strip 3   promethazine (PHENERGAN) 12.5 MG tablet Take 1 tablet (12.5 mg total) by mouth every 8 (eight) hours as needed for nausea or vomiting. 20 tablet 8   tiZANidine (ZANAFLEX) 2 MG tablet TAKE 1 TABLET BY MOUTH AT BEDTIME. 90 tablet 1   traZODone (DESYREL) 50 MG tablet TAKE 1/2 TO 1 TABLET BY MOUTH AT BEDTIME AS NEEDED FOR SLEEP (Patient taking differently: Take 50 mg by mouth as needed.) 90 tablet 1   vitamin B-12 (CYANOCOBALAMIN) 1000 MCG tablet Take 1,000 mcg by mouth daily.     Vitamin D, Ergocalciferol, (DRISDOL) 1.25 MG (50000 UNIT) CAPS capsule TAKE 1 CAPSULE BY MOUTH ONE TIME PER WEEK 12 capsule 0   No current facility-administered medications on file prior to visit.    Allergies  Allergen Reactions   Vioxx [Rofecoxib] Other (See Comments)    Excess BP which caused TIA    Prednisone Other (See Comments)    Mental status changes with high-dose oral agent. Tolerates epidural steroid injections. No associated rash or fever   Macrodantin [Nitrofurantoin Macrocrystal] Hives   Sulfa Antibiotics      Hives   Colesevelam Other (See Comments)    Leg Pain    Family History  Problem Relation Age of Onset   Colon cancer Maternal Aunt    Diabetes Paternal Uncle    Heart attack Father 32   COPD Mother    Lung cancer Brother        smoker   Mental illness Other        niece, committed suicide.   Heart attack Other        brother X 2; @ 74 & 52   Stroke Neg Hx     BP (!) 152/90    Pulse 93    Ht 5\' 6"  (1.676 m)    Wt 151 lb 12.8 oz (68.9 kg)    LMP  (LMP Unknown)    SpO2 96%    BMI 24.50 kg/m    Review of Systems She has lost a few more lbs.  HB is mild.      Objective:   Physical Exam     Lab Results  Component Value Date   CREATININE 0.93 08/19/2021   BUN 17 08/19/2021   NA 137 08/19/2021   K 3.8 08/19/2021   CL 102 08/19/2021   CO2 27 08/19/2021   A1c=7.1%    Assessment & Plan:  HTN: we'll plan to recheck next time Insulin-requiring type 2 DM: uncontrolled.  We'll favor GLP over insulin  Patient Instructions  check your blood sugar twice a day.  vary the time of day when you check, between before the 3 meals, and at bedtime.  also check if you have symptoms of your blood sugar being too high or too low.  please keep a record of the readings and bring it to your next appointment here.  You can write  it on any piece of paper.  please call us sooner if your blood sugar goes below 70, or if you have a lot of readings over 200.   Please increase the Ozempic, and stop taking the Basaglar.   Please come back for a follow-up appointment in 2 months.

## 2021-12-18 NOTE — Patient Instructions (Addendum)
check your blood sugar twice a day.  vary the time of day when you check, between before the 3 meals, and at bedtime.  also check if you have symptoms of your blood sugar being too high or too low.  please keep a record of the readings and bring it to your next appointment here.  You can write it on any piece of paper.  please call us sooner if your blood sugar goes below 70, or if you have a lot of readings over 200.   Please increase the Ozempic, and stop taking the Basaglar.   Please come back for a follow-up appointment in 2 months.

## 2021-12-26 ENCOUNTER — Other Ambulatory Visit: Payer: Self-pay | Admitting: Internal Medicine

## 2021-12-30 NOTE — Progress Notes (Signed)
Christine [name] [place] [address] [place] Phone: [phone] Subjective:   [name], am serving as a scribe for Dr. Hulan Saas. This visit occurred during the SARS-CoV-2 public health emergency.  Safety protocols were in place, including screening questions prior to the visit, additional usage of staff PPE, and extensive cleaning of exam room while observing appropriate contact time as indicated for disinfecting solutions.   I'm seeing this patient by the request  of:  [name], MD  CC: Neck and back pain, knee pain follow-up  NID:[id number]  Christine [name] is a 71 y.o. female coming in with complaint of back and neck pain. OMT 11/19/2021. Patient states same as usual. Knee is still acting up. And talk about shingles. She is off insulin. No other complaints patient states that the knee continues to give her some instability.  Patient is concerned about increasing activity at home over the regular daily activities.  Patient feels like the Baker's cyst has returned as well which patient has had but now having locking and giving out on him which is much more concerning.  Medications patient has been prescribed: Vit D, Acyclovir  Taking:         Reviewed prior external information including notes and imaging from previsou exam, outside providers and external EMR if available.   As well as notes that were available from care everywhere and other healthcare systems.  Past medical history, social, surgical and family history all reviewed in electronic medical record.  No pertanent information unless stated regarding to the chief complaint.   Past Medical History:  Diagnosis Date   Acute bursitis of right shoulder 08/29/2018   Right shoulder injected in August 29, 2018   Allergic rhinitis    Closed displaced fracture of fifth metatarsal bone of left foot 03/16/2017   DEPRESSIVE DISORDER NOT ELSEWHERE CLASSIFIED  03/27/2010   Qualifier: Diagnosis of  By: [name] MD, [name]   Christine Christine Manila, NP , [facility] [place]     Diabetes ([place]) 04/02/2016   Diabetes mellitus    Essential hypertension 03/14/2008   Qualifier: Diagnosis of  By: [name] MD, [name]     Fibromyalgia 08/12/2011   Diagnosed by [name] , Rheumatologist 2010    GERD (gastroesophageal reflux disease) 12/25/2016   Gluteal tendonitis of right buttock 10/19/2017   Injected in October 19, 2017 Injected October 07, 2018   Greater trochanteric bursitis of left hip 06/17/2016   Greater trochanteric bursitis of right hip 11/27/2015   Injected 11/27/2015    History of recurrent UTIs    [name]   HTN (hypertension)    Hx of osteopenia 10/11/2012   [facility]EX[facility]; ordered by [name] Lowest T score: -2.1 @ lumbar spine @ [facility] 11/01/12; decrease of 8.5% vs 05/2009. There is 9.2% risk over 10 yrs History fracture left elbow @ 4 Fractured left wrist , right elbow and nose post fall July/18/2013. Dr. [name] No FH Osteoporosis No PMH of bisphosphonate therapy (generic Fosamax Rxed but not filled  due to polypharmacy )    Hyperlipidemia    Hyperlipidemia associated with type 2 diabetes mellitus ([place]) 01/18/2007   Qualifier: Diagnosis of  By: [name]  With DM LDL goal = < 100, ideally < 70. Mi in father @ 48; 2 brothers @ 51 & 2     Lumbar radiculopathy 04/11/2008   Qualifier: Diagnosis of  By: [name] MD, Erick Blinks well to epidural  01/12/2017  repeat epidural given May 2018   Migraine headache    quiescent   Nonallopathic lesion of cervical region 07/19/2014   Nonallopathic lesion of lumbosacral region 01/15/2016   Nonallopathic lesion of sacral region 06/27/2018   Nonallopathic lesion-rib cage 09/26/2014   Palpitations 04/15/2011   Rotator cuff impingement syndrome of left shoulder 05/11/2014   Seasonal allergic rhinitis 03/21/2012   Sinusitis, chronic 04/20/2016   Sleep disorder    Dr Dohmier   Subluxation of peroneal tendon of  right foot 11/10/2017   TIA (transient ischemic attack)    [place], HX OF 02/02/2008   Qualifier: Diagnosis of  By: Donalee Citrin ADVRS EFF UNS RX MEDICINAL&BIOLOGICAL [place] 02/02/2008   Qualifier: Diagnosis of  By: [name] MD, Posey Pronto AND MYOSITIS 02/02/2008   Qualifier: Diagnosis of  By: [name] MD, [name]     UTI (urinary tract infection) 10/05/2014   Viral upper respiratory tract infection with cough 12/31/2014   Vitamin D deficiency 05/25/2008   Qualifier: Diagnosis of  By: [name] MD, [name]      Allergies  Allergen Reactions   Vioxx [Rofecoxib] Other (See Comments)    Excess BP which caused TIA    Prednisone Other (See Comments)    Mental status changes with high-dose oral agent. Tolerates epidural steroid injections. No associated rash or fever   Macrodantin [Nitrofurantoin Macrocrystal] Hives   Sulfa Antibiotics     Hives   Colesevelam Other (See Comments)    Leg Pain     Review of Systems:  No headache, visual changes, nausea, vomiting, diarrhea, constipation, dizziness, abdominal pain, skin rash, fevers, chills, night sweats, weight loss, swollen lymph nodes, body aches, joint swelling, chest pain, shortness of breath, mood changes. POSITIVE muscle aches  Objective  Blood pressure 118/78, pulse 71, height 5\' 6"  (1.676 m), weight 152 lb (68.9 kg), SpO2 96 %.   General: No apparent distress alert and oriented x3 mood and affect normal, dressed appropriately.  HEENT: Pupils equal, extraocular movements intact  Respiratory: Patient's speak in full sentences and does not appear short of breath  Cardiovascular: No lower extremity edema, non tender, no erythema  Antalgic gait favoring the left knee.  Patient's left knee does have some limited range of motion lacking the last 10 degrees of flexion.  Patient does have some positive McMurray's.  Tender to palpation over the medial joint line.  Limited muscular skeletal  ultrasound was performed and interpreted by Hulan Saas, M   Limited ultrasound of patient's knee shows the patient's medial meniscus does have some displacement noted.  Hypoechoic changes that is consistent with an acute tear noted.  Some degenerative tearing noted of the posterior aspect.  Baker's cyst noted Impression: Severe findings consistent with a medial meniscal tear  Osteopathic findings  C2 flexed rotated and side bent right C6 flexed rotated and side bent left T3 extended rotated and side bent right inhaled rib T9 extended rotated and side bent left L2 flexed rotated and side bent right Sacrum right on right       Assessment and Plan:  Acute medial meniscus tear of left knee Patient does have what appears to be a fairly large meniscal tear.  Discussed with patient and at this point we could consider about different treatment options such as injections.  Due to patient not having locking or instability of the knee and is seems to have further displacement on the ultrasound and previous  exam I do think that further evaluation with advanced imaging would be helpful.  Depending on findings patient could be a surgical candidate.  Cervical disc disorder with radiculopathy of cervical region Chronic, with mild exacerbation.  Has been responding well though to osteopathic manipulation.  Hopefully will continue to do so.  Discussed different medications including Zanaflex.  Follow-up with me again in 6 to 8 weeks   Nonallopathic problems  Decision today to treat with OMT was based on Physical Exam  After verbal consent patient was treated with HVLA, ME, FPR techniques in cervical, rib, thoracic, lumbar, and sacral  areas  Patient tolerated the procedure well with improvement in symptoms  Patient given exercises, stretches and lifestyle modifications  See medications in patient instructions if given  Patient will follow up in 4-8 weeks     The above documentation has  been reviewed and is accurate and complete Lyndal Pulley, DO       Note: This dictation was prepared with Dragon dictation along with smaller phrase technology. Any transcriptional errors that result from this process are unintentional.

## 2021-12-31 ENCOUNTER — Ambulatory Visit (INDEPENDENT_AMBULATORY_CARE_PROVIDER_SITE_OTHER): Payer: Medicare Other | Admitting: Family Medicine

## 2021-12-31 ENCOUNTER — Telehealth: Payer: Self-pay

## 2021-12-31 ENCOUNTER — Other Ambulatory Visit: Payer: Self-pay

## 2021-12-31 ENCOUNTER — Encounter: Payer: Self-pay | Admitting: Family Medicine

## 2021-12-31 VITALS — BP 118/78 | HR 71 | Ht 66.0 in | Wt 152.0 lb

## 2021-12-31 DIAGNOSIS — M9902 Segmental and somatic dysfunction of thoracic region: Secondary | ICD-10-CM

## 2021-12-31 DIAGNOSIS — M9901 Segmental and somatic dysfunction of cervical region: Secondary | ICD-10-CM | POA: Diagnosis not present

## 2021-12-31 DIAGNOSIS — M5010 Cervical disc disorder with radiculopathy, unspecified cervical region: Secondary | ICD-10-CM | POA: Diagnosis not present

## 2021-12-31 DIAGNOSIS — M9904 Segmental and somatic dysfunction of sacral region: Secondary | ICD-10-CM

## 2021-12-31 DIAGNOSIS — M9908 Segmental and somatic dysfunction of rib cage: Secondary | ICD-10-CM

## 2021-12-31 DIAGNOSIS — S83242A Other tear of medial meniscus, current injury, left knee, initial encounter: Secondary | ICD-10-CM

## 2021-12-31 DIAGNOSIS — M9903 Segmental and somatic dysfunction of lumbar region: Secondary | ICD-10-CM | POA: Diagnosis not present

## 2021-12-31 NOTE — Patient Instructions (Addendum)
[place] [phone] Call Today  When we receive your results we will contact you. Back and neck responded well See you again in 4-6 weeks

## 2021-12-31 NOTE — Assessment & Plan Note (Addendum)
Patient does have what appears to be a fairly large meniscal tear.  Discussed with patient and at this point we could consider about different treatment options such as injections.  Due to patient not having locking or instability of the knee and is seems to have further displacement on the ultrasound and previous exam I do think that further evaluation with advanced imaging would be helpful.  Depending on findings patient could be a surgical candidate.

## 2021-12-31 NOTE — Assessment & Plan Note (Signed)
Chronic, with mild exacerbation.  Has been responding well though to osteopathic manipulation.  Hopefully will continue to do so.  Discussed different medications including Zanaflex.  Follow-up with me again in 6 to 8 weeks

## 2021-12-31 NOTE — Progress Notes (Signed)
Chronic Care Management Pharmacy Assistant   Name: Christine [name]  MRN: [id number] DOB: [birth year]   Reason for Encounter: Disease State-General Call    Recent office visits:  None ID  Recent consult visits:  12/31/21 Smith,Zachary DO-Sports Medicine (Acute medical meniscus tear of left knee)   12/18/21 Renato Shin, MD-Endocrinology (Diabetes mellitus) Labs,   11/19/21 Lyndal Pulley, DO-Sports Medicine (Left Knee Pain)   Hospital visits:  None since last coordination call  Medications: Outpatient Encounter Medications as of 12/31/2021  Medication Sig Note   acetaminophen (TYLENOL) 500 MG tablet Take 500 mg by mouth as needed.    acyclovir (ZOVIRAX) 400 MG tablet Take 1 tablet (400 mg total) by mouth 3 (three) times daily.    aspirin 81 MG tablet Take 81 mg by mouth daily.    atorvastatin (LIPITOR) 40 MG tablet Take 1 tablet (40 mg total) by mouth daily.    BD PEN NEEDLE NANO 2ND GEN 32G X 4 MM MISC USE ONCE A DAY AS DIRECTED    busPIRone (BUSPAR) 15 MG tablet Take 15 mg by mouth 3 (three) times daily.    cephALEXin (KEFLEX) 250 MG capsule Take 250 mg by mouth daily.    DULoxetine (CYMBALTA) 60 MG capsule Take 60 mg by mouth daily.    Lancets (ONETOUCH ULTRASOFT) lancets Check blood sugar once daily. Dx code: 250.00 04/21/2021: use   LORazepam (ATIVAN) 0.5 MG tablet Take 0.5 mg by mouth as needed for anxiety.    losartan (COZAAR) 50 MG tablet Take 1 tablet (50 mg total) by mouth daily.    omeprazole (PRILOSEC) 20 MG capsule TAKE 1 CAPSULE BY MOUTH EVERY DAY    ONETOUCH ULTRA test strip USE 1 STRIP TWICE DAILY DX CODE E11.65    promethazine (PHENERGAN) 12.5 MG tablet Take 1 tablet (12.5 mg total) by mouth every 8 (eight) hours as needed for nausea or vomiting.    Semaglutide, 2 MG/DOSE, (OZEMPIC, 2 MG/DOSE,) 8 MG/3ML SOPN Inject 2 mg into the skin once a week. Gets from pt assist    tiZANidine (ZANAFLEX) 2 MG tablet TAKE 1 TABLET BY MOUTH AT BEDTIME.    traZODone  (DESYREL) 50 MG tablet TAKE 1/2 TO 1 TABLET BY MOUTH AT BEDTIME AS NEEDED FOR SLEEP    vitamin B-12 (CYANOCOBALAMIN) 1000 MCG tablet Take 1,000 mcg by mouth daily.    Vitamin D, Ergocalciferol, (DRISDOL) 1.25 MG (50000 UNIT) CAPS capsule TAKE 1 CAPSULE BY MOUTH ONE TIME PER WEEK    No facility-administered encounter medications on file as of 12/31/2021.   Have you had any problems recently with your health?Patient states that she is doing well, just have a torn mensicus, will have mri soon   Have you had any problems with your pharmacy?Patient states that she does not have any problems with getting medications or the cost of medications from the pharmacy  What issues or side effects are you having with your medications?Patient states that she does not have any side effects from any medications  What would you like me to pass along to [facility] for them to help you with?Patient states that she is doing fine just waiting for her social security statement to turn in to [employer] for [employer]   What can we do to take care of you better? Patient states that she does not need anything at this time  Care Gaps: Colonoscopy-11/03/07 Diabetic Foot Exam-09/23/21 Mammogram-01/27/21 Ophthalmology-06/01/14 Dexa Scan - NA Annual Well Visit - NA Micro albumin-NA  Hemoglobin A1c- 12/18/21  Star Rating Drugs: Losartan 50 mg-last fill 12/19/21 90 ds Atorvastatin 40 mg-last fill 11/01/21 90 ds  Ethelene Hal Clinical Pharmacist Assistant [phone]

## 2022-01-05 ENCOUNTER — Encounter: Payer: Self-pay | Admitting: Internal Medicine

## 2022-01-06 ENCOUNTER — Telehealth: Payer: Self-pay | Admitting: Endocrinology

## 2022-01-06 ENCOUNTER — Other Ambulatory Visit: Payer: Self-pay

## 2022-01-06 ENCOUNTER — Ambulatory Visit
Admission: RE | Admit: 2022-01-06 | Discharge: 2022-01-06 | Disposition: A | Discharge status: Home / Self Care | Payer: Medicare Other | Source: Ambulatory Visit | Attending: Family Medicine | Admitting: Family Medicine

## 2022-01-06 DIAGNOSIS — S83242A Other tear of medial meniscus, current injury, left knee, initial encounter: Secondary | ICD-10-CM | POA: Diagnosis not present

## 2022-01-06 DIAGNOSIS — M7122 Synovial cyst of popliteal space [Baker], left knee: Secondary | ICD-10-CM | POA: Diagnosis not present

## 2022-01-06 NOTE — Telephone Encounter (Signed)
Pt is came in and dropped off a pt assistance form to be completed by the provider ([place] Program Application).  Form placed in providers folder for completion.

## 2022-01-06 NOTE — Telephone Encounter (Signed)
Noted  

## 2022-01-07 ENCOUNTER — Encounter: Payer: Self-pay | Admitting: Family Medicine

## 2022-01-07 DIAGNOSIS — F339 Major depressive disorder, recurrent, unspecified: Secondary | ICD-10-CM | POA: Diagnosis not present

## 2022-01-07 DIAGNOSIS — F410 Panic disorder [episodic paroxysmal anxiety] without agoraphobia: Secondary | ICD-10-CM | POA: Diagnosis not present

## 2022-01-07 NOTE — Progress Notes (Signed)
[place] Phone: [phone] Subjective:   [name], am serving as a scribe for Dr. Hulan Saas. This visit occurred during the SARS-CoV-2 public health emergency.  Safety protocols were in place, including screening questions prior to the visit, additional usage of staff PPE, and extensive cleaning of exam room while observing appropriate contact time as indicated for disinfecting solutions.  I'm seeing this patient by the request  of:  [name], MD  CC:   [id number]  12/31/2021 Patient does have what appears to be a fairly large meniscal tear.  Discussed with patient and at this point we could consider about different treatment options such as injections.  Due to patient not having locking or instability of the knee and is seems to have further displacement on the ultrasound and previous exam I do think that further evaluation with advanced imaging would be helpful.  Depending on findings patient could be a surgical candidate.  Chronic, with mild exacerbation.  Has been responding well though to osteopathic manipulation.  Hopefully will continue to do so.  Discussed different medications including Zanaflex.  Follow-up with me again in 6 to 8 weeks  OMT 12/31/2021  Updated 01/08/2022 Brylyn Stutts [place] is a 71 y.o. female coming in with complaint of left knee pain. MRI results f/u.  MRI IMPRESSION: 1. Oblique undersurface tear of the medial meniscal body and posterior horn. 2. Mild medial and patellofemoral compartment osteoarthritis. 3. Moderate sized Baker's cyst.     Past Medical History:  Diagnosis Date   Acute bursitis of right shoulder 08/29/2018   Right shoulder injected in August 29, 2018   Allergic rhinitis    Closed displaced fracture of fifth metatarsal bone of left foot 03/16/2017   DEPRESSIVE DISORDER NOT ELSEWHERE CLASSIFIED 03/27/2010   Qualifier: Diagnosis of  By:  [name] MD, [name]   [name], NP , [facility] [place]     Diabetes ([place]) 04/02/2016   Diabetes mellitus    Essential hypertension 03/14/2008   Qualifier: Diagnosis of  By: [name] MD, [name]     Fibromyalgia 08/12/2011   Diagnosed by [name] , Rheumatologist 2010    GERD (gastroesophageal reflux disease) 12/25/2016   Gluteal tendonitis of right buttock 10/19/2017   Injected in October 19, 2017 Injected October 07, 2018   Greater trochanteric bursitis of left hip 06/17/2016   Greater trochanteric bursitis of right hip 11/27/2015   Injected 11/27/2015    History of recurrent UTIs    [name]   HTN (hypertension)    Hx of osteopenia 10/11/2012   [facility]; ordered by [name] Lowest T score: -2.1 @ lumbar spine @ [facility] 11/01/12; decrease of 8.5% vs 05/2009. There is 9.2% risk over 10 yrs History fracture left elbow @ 4 Fractured left wrist , right elbow and nose post fall July/18/2013. Dr. [name] No FH Osteoporosis No PMH of bisphosphonate therapy (generic Fosamax Rxed but not filled  due to polypharmacy )    Hyperlipidemia    Hyperlipidemia associated with type 2 diabetes mellitus ([place]) 01/18/2007   Qualifier: Diagnosis of  By: [name]  With DM LDL goal = < 100, ideally < 70. Mi in father @ 78; 2 brothers @ 27 & 7     Lumbar radiculopathy 04/11/2008   Qualifier: Diagnosis of  By: [name] MD, Erick Blinks well to epidural 01/12/2017  repeat epidural given May 2018   Migraine headache  quiescent   Nonallopathic lesion of cervical region 07/19/2014   Nonallopathic lesion of lumbosacral region 01/15/2016   Nonallopathic lesion of sacral region 06/27/2018   Nonallopathic lesion-rib cage 09/26/2014   Palpitations 04/15/2011   Rotator cuff impingement syndrome of left shoulder 05/11/2014   Seasonal allergic rhinitis 03/21/2012   Sinusitis, chronic 04/20/2016   Sleep disorder    Dr Dohmier   Subluxation of peroneal tendon of right foot 11/10/2017   TIA (transient  ischemic attack)    [place], HX OF 02/02/2008   Qualifier: Diagnosis of  By: Donalee Citrin ADVRS EFF UNS RX MEDICINAL&BIOLOGICAL [place] 02/02/2008   Qualifier: Diagnosis of  By: [name] MD, Posey Pronto AND MYOSITIS 02/02/2008   Qualifier: Diagnosis of  By: [name] MD, [name]     UTI (urinary tract infection) 10/05/2014   Viral upper respiratory tract infection with cough 12/31/2014   Vitamin D deficiency 05/25/2008   Qualifier: Diagnosis of  By: [name] MD, [name]     Past Surgical History:  Procedure Laterality Date   COLONOSCOPY     Dr Carlean [name]; hemorrhoids   CORONARY ANGIOPLASTY  1997   for chest pain- negative    POLYPECTOMY  2002   benign, hyperplastic polyp; rectal bleeding 2008   TONSILLECTOMY     TOTAL ABDOMINAL HYSTERECTOMY  1983   BSO for endometriosis   WISDOM TOOTH EXTRACTION     Social History   Socioeconomic History   Marital status: Married    Spouse name: Not on file   Number of children: Not on file   Years of education: Not on file   Highest education level: Not on file  Occupational History   Occupation: Research scientist (physical sciences)     Employer: [employer]  Tobacco Use   Smoking status: Never   Smokeless tobacco: Never  Vaping Use   Vaping Use: Never used  Substance and Sexual Activity   Alcohol use: No   Drug use: No   Sexual activity: Not on file  Other Topics Concern   Not on file  Social History Narrative   Regular exercise- no    Social Determinants of Health   Financial Resource Strain: Low Risk    Difficulty of Paying Living Expenses: Not hard at all  Food Insecurity: No Food Insecurity   Worried About Charity fundraiser in the Last Year: Never true   Arboriculturist in the Last Year: Never true  Transportation Needs: No Transportation Needs   Lack of Transportation (Medical): No   Lack of Transportation (Non-Medical): No  Physical Activity: Inactive   Days of Exercise per Week:  0 days   Minutes of Exercise per Session: 0 min  Stress: No Stress Concern Present   Feeling of Stress : Not at all  Social Connections: Socially Integrated   Frequency of Communication with Friends and Family: More than three times a week   Frequency of Social Gatherings with Friends and Family: More than three times a week   Attends Religious Services: More than 4 times per year   Active Member of [employer] or Organizations: Yes   Attends Music therapist: More than 4 times per year   Marital Status: Married   Allergies  Allergen Reactions   Vioxx [Rofecoxib] Other (See Comments)    Excess BP which caused TIA    Prednisone Other (See Comments)    Mental status changes with high-dose oral agent. Tolerates  epidural steroid injections. No associated rash or fever   Macrodantin [Nitrofurantoin Macrocrystal] Hives   Sulfa Antibiotics     Hives   Colesevelam Other (See Comments)    Leg Pain   Family History  Problem Relation Age of Onset   Colon cancer Maternal Aunt    Diabetes Paternal Uncle    Heart attack Father 21   COPD Mother    Lung cancer Brother        smoker   Mental illness Other        niece, committed suicide.   Heart attack Other        brother X 2; @ 75 & 25   Stroke Neg Hx     Current Outpatient Medications (Endocrine & Metabolic):    Semaglutide, 2 MG/DOSE, (OZEMPIC, 2 MG/DOSE,) 8 MG/3ML SOPN, Inject 2 mg into the skin once a week. Gets from pt assist  Current Outpatient Medications (Cardiovascular):    atorvastatin (LIPITOR) 40 MG tablet, Take 1 tablet (40 mg total) by mouth daily.   losartan (COZAAR) 50 MG tablet, Take 1 tablet (50 mg total) by mouth daily.  Current Outpatient Medications (Respiratory):    promethazine (PHENERGAN) 12.5 MG tablet, Take 1 tablet (12.5 mg total) by mouth every 8 (eight) hours as needed for nausea or vomiting.  Current Outpatient Medications (Analgesics):    acetaminophen (TYLENOL) 500 MG tablet, Take 500 mg by  mouth as needed.   aspirin 81 MG tablet, Take 81 mg by mouth daily.   traMADol (ULTRAM) 50 MG tablet, Take 1 tablet (50 mg total) by mouth every 8 (eight) hours as needed for up to 3 days.  Current Outpatient Medications (Hematological):    vitamin B-12 (CYANOCOBALAMIN) 1000 MCG tablet, Take 1,000 mcg by mouth daily.  Current Outpatient Medications (Other):    acyclovir (ZOVIRAX) 400 MG tablet, Take 1 tablet (400 mg total) by mouth 3 (three) times daily.   BD PEN NEEDLE NANO 2ND GEN 32G X 4 MM MISC, USE ONCE A DAY AS DIRECTED   busPIRone (BUSPAR) 15 MG tablet, Take 15 mg by mouth 3 (three) times daily.   cephALEXin (KEFLEX) 250 MG capsule, Take 250 mg by mouth daily.   DULoxetine (CYMBALTA) 60 MG capsule, Take 60 mg by mouth daily.   Lancets (ONETOUCH ULTRASOFT) lancets, Check blood sugar once daily. Dx code: 250.00   LORazepam (ATIVAN) 0.5 MG tablet, Take 0.5 mg by mouth as needed for anxiety.   omeprazole (PRILOSEC) 20 MG capsule, TAKE 1 CAPSULE BY MOUTH EVERY DAY   ONETOUCH ULTRA test strip, USE 1 STRIP TWICE DAILY DX CODE E11.65   tiZANidine (ZANAFLEX) 2 MG tablet, TAKE 1 TABLET BY MOUTH AT BEDTIME.   traZODone (DESYREL) 50 MG tablet, TAKE 1/2 TO 1 TABLET BY MOUTH AT BEDTIME AS NEEDED FOR SLEEP   Vitamin D, Ergocalciferol, (DRISDOL) 1.25 MG (50000 UNIT) CAPS capsule, TAKE 1 CAPSULE BY MOUTH ONE TIME PER WEEK   Reviewed prior external information including notes and imaging from  primary care provider As well as notes that were available from care everywhere and other healthcare systems.  Past medical history, social, surgical and family history all reviewed in electronic medical record.  No pertanent information unless stated regarding to the chief complaint.   Review of Systems:  No headache, visual changes, nausea, vomiting, diarrhea, constipation, dizziness, abdominal pain, skin rash, fevers, chills, night sweats, weight loss, swollen lymph nodes, body aches, joint swelling, chest  pain, shortness of breath, mood changes. POSITIVE muscle aches  Objective  Blood pressure (!) 142/86, pulse 85, height 5\' 6"  (1.676 m), weight 150 lb (68 kg), SpO2 96 %.   General: No apparent distress alert and oriented x3 mood and affect normal, dressed appropriately.  HEENT: Pupils equal, extraocular movements intact  Respiratory: Patient's speak in full sentences and does not appear short of breath  Cardiovascular: No lower extremity edema, non tender, no erythema  Antalgic favoring the left knee.    Impression and Recommendations:    The above documentation has been reviewed and is accurate and complete Lyndal Pulley, DO

## 2022-01-08 ENCOUNTER — Ambulatory Visit (INDEPENDENT_AMBULATORY_CARE_PROVIDER_SITE_OTHER): Payer: Medicare Other | Admitting: Family Medicine

## 2022-01-08 ENCOUNTER — Other Ambulatory Visit: Payer: Self-pay

## 2022-01-08 ENCOUNTER — Ambulatory Visit (INDEPENDENT_AMBULATORY_CARE_PROVIDER_SITE_OTHER): Payer: Medicare Other | Admitting: Nurse Practitioner

## 2022-01-08 VITALS — BP 142/86 | HR 85 | Ht 66.0 in | Wt 150.0 lb

## 2022-01-08 VITALS — BP 123/85 | HR 97 | Temp 97.8°F | Ht 66.0 in | Wt 150.0 lb

## 2022-01-08 DIAGNOSIS — S83242A Other tear of medial meniscus, current injury, left knee, initial encounter: Secondary | ICD-10-CM

## 2022-01-08 DIAGNOSIS — R519 Headache, unspecified: Secondary | ICD-10-CM

## 2022-01-08 DIAGNOSIS — M5010 Cervical disc disorder with radiculopathy, unspecified cervical region: Secondary | ICD-10-CM | POA: Diagnosis not present

## 2022-01-08 DIAGNOSIS — L821 Other seborrheic keratosis: Secondary | ICD-10-CM | POA: Insufficient documentation

## 2022-01-08 DIAGNOSIS — L719 Rosacea, unspecified: Secondary | ICD-10-CM | POA: Insufficient documentation

## 2022-01-08 MED ORDER — TRAMADOL HCL 50 MG PO TABS: 50.0000 mg | ORAL_TABLET | Freq: Three times a day (TID) | ORAL | 0 refills | Status: AC | PRN

## 2022-01-08 MED ORDER — METHYLPREDNISOLONE ACETATE 40 MG/ML IJ SUSP
40.0000 mg | Freq: Once | INTRAMUSCULAR | Status: AC
  Administered 2022-01-08: 40 mg via INTRAMUSCULAR

## 2022-01-08 MED ORDER — KETOROLAC TROMETHAMINE 30 MG/ML IJ SOLN
30.0000 mg | Freq: Once | INTRAMUSCULAR | Status: AC
  Administered 2022-01-08: 30 mg via INTRAMUSCULAR

## 2022-01-08 NOTE — Assessment & Plan Note (Addendum)
Patient was also having exacerbation of some of her neck pain.  Given an injection of Toradol today.  Warned of potential side effects.  Avoided anti-inflammatories with patient having the injection today.  Patient is also given an injection of Depo-Medrol but did only 40 mg secondary to patient being off the insulin at this point and only doing the Ozempic.  Total time with patient today answering the questions from her and her husband reviewing patient's MRI greater than 33 minutes

## 2022-01-08 NOTE — Patient Instructions (Addendum)
Referral Ordered [facility] on [address] See you at next appointment

## 2022-01-08 NOTE — Assessment & Plan Note (Signed)
Discussed with patient at great length.  Patient did bring her husband with her as well.  We discussed the findings on the MRI.  Patient does have an oblique tear of the undersurface of the meniscus.  Does appear to even be more displaced we did look on it on the ultrasound.  Patient does have some mild underlying arthritic changes but nothing severe.  Moderate Baker's cyst noted as well.  Discussed with patient that I am concerned with him continuing to have the instability as I do feel that the tear of the medial meniscus body and posterior horn is likely contributing.  At this point I do feel that her best chance of improvement would be surgical intervention.  Patient has been sent and referred appropriately. Patient can follow-up with me after surgical intervention if necessary.

## 2022-01-08 NOTE — Progress Notes (Signed)
Subjective:  Patient ID: Christine [name], female    DOB: 25-Mar-1951  Age: 71 y.o. MRN: [id number]  CC:  Chief Complaint  Patient presents with   Headache      HPI  This patient arrives today for the above.  Symptoms started 3 days ago.  She has a history of migraines and tells me headache is similar to migraine she had in the past.  She used to take tramadol but is out of this medication, she tells me that usually works for her headaches.  She also takes promethazine as needed and she does have this at home.  She does report some nausea with her headache and mild photophobia.  She tells me headache is 5/10 in intensity.  She denies any neurologic complaints such as blurry vision, double vision, loss of vision, difficulty moving any limbs or sensory changes.  Headache is located near left temple and radiates up to the middle of her head as well as down into her jaw.  Past Medical History:  Diagnosis Date   Acute bursitis of right shoulder 08/29/2018   Right shoulder injected in August 29, 2018   Allergic rhinitis    Closed displaced fracture of fifth metatarsal bone of left foot 03/16/2017   DEPRESSIVE DISORDER NOT ELSEWHERE CLASSIFIED 03/27/2010   Qualifier: Diagnosis of  By: [name] MD, [name]   [name], NP , [facility] [place]     Diabetes ([place]) 04/02/2016   Diabetes mellitus    Essential hypertension 03/14/2008   Qualifier: Diagnosis of  By: [name] MD, [name]     Fibromyalgia 08/12/2011   Diagnosed by [name] , Rheumatologist 2010    GERD (gastroesophageal reflux disease) 12/25/2016   Gluteal tendonitis of right buttock 10/19/2017   Injected in October 19, 2017 Injected October 07, 2018   Greater trochanteric bursitis of left hip 06/17/2016   Greater trochanteric bursitis of right hip 11/27/2015   Injected 11/27/2015    History of recurrent UTIs    [name]   HTN (hypertension)    Hx of osteopenia 10/11/2012   [facility] DEXA; ordered by Dr  [name] Lowest T score: -2.1 @ lumbar spine @ [facility] 11/01/12; decrease of 8.5% vs 05/2009. There is 9.2% risk over 10 yrs History fracture left elbow @ 4 Fractured left wrist , right elbow and nose post fall July/18/2013. Dr. [name] No FH Osteoporosis No PMH of bisphosphonate therapy (generic Fosamax Rxed but not filled  due to polypharmacy )    Hyperlipidemia    Hyperlipidemia associated with type 2 diabetes mellitus ([place]) 01/18/2007   Qualifier: Diagnosis of  By: [name]  With DM LDL goal = < 100, ideally < 70. Mi in father @ 43; 2 brothers @ 73 & 47     Lumbar radiculopathy 04/11/2008   Qualifier: Diagnosis of  By: [name] MD, Erick Blinks well to epidural 01/12/2017  repeat epidural given May 2018   Migraine headache    quiescent   Nonallopathic lesion of cervical region 07/19/2014   Nonallopathic lesion of lumbosacral region 01/15/2016   Nonallopathic lesion of sacral region 06/27/2018   Nonallopathic lesion-rib cage 09/26/2014   Palpitations 04/15/2011   Rotator cuff impingement syndrome of left shoulder 05/11/2014   Seasonal allergic rhinitis 03/21/2012   Sinusitis, chronic 04/20/2016   Sleep disorder    Dr Dohmier   Subluxation of peroneal tendon of right foot 11/10/2017   TIA (transient ischemic attack)    TRANSIENT ISCHEMIC  ATTACKS, HX OF 02/02/2008   Qualifier: Diagnosis of  By: Donalee Citrin ADVRS EFF UNS RX MEDICINAL&BIOLOGICAL [place] 02/02/2008   Qualifier: Diagnosis of  By: [name] MD, Posey Pronto AND MYOSITIS 02/02/2008   Qualifier: Diagnosis of  By: [name] MD, [name]     UTI (urinary tract infection) 10/05/2014   Viral upper respiratory tract infection with cough 12/31/2014   Vitamin D deficiency 05/25/2008   Qualifier: Diagnosis of  By: [name] MD, Virl Diamond History  Problem Relation Age of Onset   Colon cancer Maternal Aunt    Diabetes Paternal Uncle    Heart attack Father 61   COPD Mother    Lung cancer Brother         smoker   Mental illness Other        niece, committed suicide.   Heart attack Other        brother X 2; @ 1 & 14   Stroke Neg Hx     Social History   Social History Narrative   Regular exercise- no    Social History   Tobacco Use   Smoking status: Never   Smokeless tobacco: Never  Substance Use Topics   Alcohol use: No     Current Meds  Medication Sig   acetaminophen (TYLENOL) 500 MG tablet Take 500 mg by mouth as needed.   acyclovir (ZOVIRAX) 400 MG tablet Take 1 tablet (400 mg total) by mouth 3 (three) times daily.   aspirin 81 MG tablet Take 81 mg by mouth daily.   atorvastatin (LIPITOR) 40 MG tablet Take 1 tablet (40 mg total) by mouth daily.   BD PEN NEEDLE NANO 2ND GEN 32G X 4 MM MISC USE ONCE A DAY AS DIRECTED   busPIRone (BUSPAR) 15 MG tablet Take 15 mg by mouth 3 (three) times daily.   cephALEXin (KEFLEX) 250 MG capsule Take 250 mg by mouth daily.   DULoxetine (CYMBALTA) 60 MG capsule Take 60 mg by mouth daily.   Lancets (ONETOUCH ULTRASOFT) lancets Check blood sugar once daily. Dx code: 250.00   LORazepam (ATIVAN) 0.5 MG tablet Take 0.5 mg by mouth as needed for anxiety.   losartan (COZAAR) 50 MG tablet Take 1 tablet (50 mg total) by mouth daily.   omeprazole (PRILOSEC) 20 MG capsule TAKE 1 CAPSULE BY MOUTH EVERY DAY   ONETOUCH ULTRA test strip USE 1 STRIP TWICE DAILY DX CODE E11.65   promethazine (PHENERGAN) 12.5 MG tablet Take 1 tablet (12.5 mg total) by mouth every 8 (eight) hours as needed for nausea or vomiting.   Semaglutide, 2 MG/DOSE, (OZEMPIC, 2 MG/DOSE,) 8 MG/3ML SOPN Inject 2 mg into the skin once a week. Gets from pt assist   tiZANidine (ZANAFLEX) 2 MG tablet TAKE 1 TABLET BY MOUTH AT BEDTIME.   traMADol (ULTRAM) 50 MG tablet Take 1 tablet (50 mg total) by mouth every 8 (eight) hours as needed for up to 3 days.   traZODone (DESYREL) 50 MG tablet TAKE 1/2 TO 1 TABLET BY MOUTH AT BEDTIME AS NEEDED FOR SLEEP   vitamin B-12 (CYANOCOBALAMIN) 1000 MCG  tablet Take 1,000 mcg by mouth daily.   Vitamin D, Ergocalciferol, (DRISDOL) 1.25 MG (50000 UNIT) CAPS capsule TAKE 1 CAPSULE BY MOUTH ONE TIME PER WEEK    ROS:  Review of Systems  Constitutional:  Negative for fever.  HENT:  Negative for sinus pain.   Eyes:  Negative for blurred vision and double vision.  Respiratory:  Negative for cough and shortness of breath.     Objective:   Today's Vitals: BP 123/85 (BP Location: Left Arm, Patient Position: Sitting, Cuff Size: Normal)    Pulse 97    Temp 97.8 F (36.6 C) (Oral)    Ht 5\' 6"  (1.676 m)    Wt 150 lb (68 kg)    LMP  (LMP Unknown)    SpO2 98%    BMI 24.21 kg/m  Vitals with BMI 01/08/2022 12/31/2021 12/18/2021  Height 5\' 6"  5\' 6"  5\' 6"   Weight 150 lbs 152 lbs 151 lbs 13 oz  BMI 24.22 35.00 93.81  Systolic 829 937 169  Diastolic 85 78 90  Pulse 97 71 93     Physical Exam Vitals reviewed.  Constitutional:      General: She is not in acute distress.    Appearance: Normal appearance.  HENT:     Head: Normocephalic and atraumatic.  Neck:     Vascular: No carotid bruit.  Cardiovascular:     Rate and Rhythm: Normal rate and regular rhythm.     Pulses: Normal pulses.     Heart sounds: Normal heart sounds.  Pulmonary:     Effort: Pulmonary effort is normal.     Breath sounds: Normal breath sounds.  Skin:    General: Skin is warm and dry.  Neurological:     General: No focal deficit present.     Mental Status: She is alert and oriented to person, place, and time.     Cranial Nerves: Cranial nerves 2-12 are intact.     Sensory: Sensation is intact.     Motor: Motor function is intact.     Coordination: Coordination is intact.     Gait: Gait is intact.  Psychiatric:        Mood and Affect: Mood normal.        Behavior: Behavior normal.        Judgment: Judgment normal.         Assessment and Plan   1. Acute nonintractable headache, unspecified headache type      Plan: 1.  Reviewed [employer] controlled [employer].  Will prescribe short course of tramadol that she can take as needed for headache.  She was told if symptoms persist she should notify us so we can rule out giant cell arteritis.  She tells me she understands.  Also told her to avoid taking her Ativan at the same time is taking her tramadol she tells me she understands and will avoid this.   Tests ordered No orders of the defined types were placed in this encounter.     Meds ordered this encounter  Medications   traMADol (ULTRAM) 50 MG tablet    Sig: Take 1 tablet (50 mg total) by mouth every 8 (eight) hours as needed for up to 3 days.    Dispense:  9 tablet    Refill:  0    Order Specific Question:   Supervising Provider    Answer:   Binnie Rail [id number]    Patient to follow-up with PCP as scheduled, or sooner as needed.  Ailene Ards, NP

## 2022-01-12 ENCOUNTER — Encounter: Payer: Self-pay | Admitting: Internal Medicine

## 2022-01-12 MED ORDER — MOLNUPIRAVIR EUA 200MG CAPSULE: 4.0000 | ORAL_CAPSULE | Freq: Two times a day (BID) | ORAL | 0 refills | Status: AC

## 2022-01-20 NOTE — Telephone Encounter (Signed)
Message sent thru MyChart 

## 2022-01-20 NOTE — Telephone Encounter (Signed)
Patient called wanting to know the status of her patient assistance for her Ozempic she says she has about 3 weeks remaining and if her patient assistance isnt approved how will she need to move forward? I see the previous encounter from 1/24 says that she dropped off the paperwork and waiting for completion.

## 2022-01-21 ENCOUNTER — Telehealth: Payer: Self-pay

## 2022-01-21 ENCOUNTER — Encounter: Payer: Self-pay | Admitting: Endocrinology

## 2022-01-21 NOTE — Telephone Encounter (Signed)
[employer] papers has been printed out and placed up from for pt to pick up

## 2022-01-23 DIAGNOSIS — M25562 Pain in left knee: Secondary | ICD-10-CM | POA: Diagnosis not present

## 2022-01-23 NOTE — Telephone Encounter (Signed)
Pt came in to the office today to pick up 2nd Patient Assistance Forms [employer]) to be completed by the provider so that she is able to get assistance with her Ozempic.  Pt stated that she has dropped the form off previously on 01/06/2022 and not sure as what the purpose of her doing another form.  Pt was advised if she could get the form and attached income forms back to [place] we will get them rush fax over to the company.  Pt stated that she has sent back several msgs to Dr. Loanne Drilling and has not had a reply back and not sure if she should run out of her Ozempic if she should go back on her insulin.  Pt stated that she would really appreciate if she could get a call back to advise her as what she should do.

## 2022-01-26 NOTE — Telephone Encounter (Signed)
Patient dropped off completed [employer] paperwork.  Per patients request I will keep a copy of paperwork in case - .  Also per patients request have handed [employer] paperwork. to Dr Cordelia Pen assistant Mardene Celeste.

## 2022-02-02 ENCOUNTER — Encounter: Payer: Self-pay | Admitting: Endocrinology

## 2022-02-03 ENCOUNTER — Ambulatory Visit: Payer: Medicare Other | Admitting: Interventional Cardiology

## 2022-02-03 ENCOUNTER — Other Ambulatory Visit: Payer: Self-pay | Admitting: Internal Medicine

## 2022-02-04 ENCOUNTER — Ambulatory Visit: Payer: Medicare Other | Admitting: Family Medicine

## 2022-02-09 ENCOUNTER — Other Ambulatory Visit: Payer: Self-pay

## 2022-02-09 ENCOUNTER — Encounter: Payer: Self-pay | Admitting: Emergency Medicine

## 2022-02-09 ENCOUNTER — Ambulatory Visit (INDEPENDENT_AMBULATORY_CARE_PROVIDER_SITE_OTHER): Payer: Medicare Other | Admitting: Emergency Medicine

## 2022-02-09 VITALS — BP 130/78 | HR 96 | Temp 97.6°F | Ht 66.0 in | Wt 145.2 lb

## 2022-02-09 DIAGNOSIS — R0981 Nasal congestion: Secondary | ICD-10-CM

## 2022-02-09 DIAGNOSIS — R519 Headache, unspecified: Secondary | ICD-10-CM

## 2022-02-09 DIAGNOSIS — J0101 Acute recurrent maxillary sinusitis: Secondary | ICD-10-CM | POA: Diagnosis not present

## 2022-02-09 MED ORDER — AMOXICILLIN-POT CLAVULANATE 875-125 MG PO TABS: 1.0000 | ORAL_TABLET | Freq: Two times a day (BID) | ORAL | 0 refills | Status: DC

## 2022-02-09 NOTE — Patient Instructions (Signed)

## 2022-02-09 NOTE — Progress Notes (Signed)
Noreta Stutts Wussow 71 y.o.   Chief Complaint  Patient presents with   sinus pressure   Headache    Had COVID 01/12/2022    HISTORY OF PRESENT ILLNESS: Acute problem visit today. This is a 71 y.o. female complaining of sinus pressure and headache for the past several days. Just recovering from COVID infection that started on 01/11/2022.  Has postnasal drip and occasional nasal discharge. Has history of bacterial sinus infection in the past.  States that she became septic last year. No other complaints or medical concerns today.  Headache  Pertinent negatives include no abdominal pain, coughing, fever, nausea, sore throat or vomiting.    Prior to Admission medications   Medication Sig Start Date End Date Taking? Authorizing Provider  acetaminophen (TYLENOL) 500 MG tablet Take 500 mg by mouth as needed.   Yes [provider]  acyclovir (ZOVIRAX) 400 MG tablet Take 1 tablet (400 mg total) by mouth 3 (three) times daily. 11/19/21  Yes Lyndal Pulley, DO  aspirin 81 MG tablet Take 81 mg by mouth daily.   Yes [provider]  atorvastatin (LIPITOR) 40 MG tablet TAKE 1 TABLET BY MOUTH EVERY DAY 02/03/22  Yes [name], MD  BD PEN NEEDLE NANO 2ND GEN 32G X 4 MM MISC USE ONCE A DAY AS DIRECTED 07/15/21  Yes Renato Shin, MD  busPIRone (BUSPAR) 15 MG tablet Take 15 mg by mouth 3 (three) times daily. 07/16/21  Yes [provider]  cephALEXin (KEFLEX) 250 MG capsule Take 250 mg by mouth daily.   Yes [provider]  DULoxetine (CYMBALTA) 60 MG capsule Take 60 mg by mouth daily.   Yes [provider]  Lancets (ONETOUCH ULTRASOFT) lancets Check blood sugar once daily. Dx code: 250.00 11/08/13  Yes Hendricks Limes, MD  LORazepam (ATIVAN) 0.5 MG tablet Take 0.5 mg by mouth as needed for anxiety.   Yes [provider]  losartan (COZAAR) 50 MG tablet Take 1 tablet (50 mg total) by mouth daily. 09/29/21  Yes Belva Crome, MD   omeprazole (PRILOSEC) 20 MG capsule TAKE 1 CAPSULE BY MOUTH EVERY DAY 10/14/21  Yes [name], MD  [facility] ULTRA test strip USE 1 STRIP TWICE DAILY DX CODE E11.65 06/06/21  Yes Renato Shin, MD  promethazine (PHENERGAN) 12.5 MG tablet Take 1 tablet (12.5 mg total) by mouth every 8 (eight) hours as needed for nausea or vomiting. 04/21/21  Yes [name], MD  Semaglutide, 2 MG/DOSE, (OZEMPIC, 2 MG/DOSE,) 8 MG/3ML SOPN Inject 2 mg into the skin once a week. Gets from pt assist 12/18/21  Yes Renato Shin, MD  tiZANidine (ZANAFLEX) 2 MG tablet TAKE 1 TABLET BY MOUTH AT BEDTIME. 11/03/21  Yes Lyndal Pulley, DO  traZODone (DESYREL) 50 MG tablet TAKE 1/2 TO 1 TABLET BY MOUTH AT BEDTIME AS NEEDED FOR SLEEP 12/26/21  Yes [name], MD  vitamin B-12 (CYANOCOBALAMIN) 1000 MCG tablet Take 1,000 mcg by mouth daily.   Yes [provider]  Vitamin D, Ergocalciferol, (DRISDOL) 1.25 MG (50000 UNIT) CAPS capsule TAKE 1 CAPSULE BY MOUTH ONE TIME PER WEEK 11/25/21  Yes Hulan Saas M, DO    Allergies  Allergen Reactions   Vioxx [Rofecoxib] Other (See Comments)    Excess BP which caused TIA    Prednisone Other (See Comments)    Mental status changes with high-dose oral agent. Tolerates epidural steroid injections. No associated rash or fever   Macrodantin [Nitrofurantoin Macrocrystal] Hives  Sulfa Antibiotics     Hives   Colesevelam Other (See Comments)    Leg Pain    Patient Active Problem List   Diagnosis Date Noted   Rosacea 01/08/2022   Seborrheic keratosis 01/08/2022   Baker's cyst of knee, left 10/09/2021   Acute medial meniscus tear of left knee 09/02/2021   Bacteremia 08/15/2021   Bacteria in urine 08/07/2021   Urinary hesitancy 08/07/2021   Elevated troponin 08/07/2021   Diabetes mellitus type 2 in nonobese ([place]) 08/07/2021   Tachycardia 08/07/2021   Sepsis ([place]) 08/06/2021   Dizziness 04/22/2021   Arthritis of right hip 02/12/2021   Leg  swelling 07/12/2020   Cervical disc disorder with radiculopathy of cervical region 01/25/2020   Coronary atherosclerosis of native coronary artery 01/25/2020   Subluxation of peroneal tendon of right foot 11/10/2017   Lumbar radiculopathy, right 06/23/2017   Closed displaced fracture of fifth metatarsal bone of left foot 03/16/2017   GERD (gastroesophageal reflux disease) 12/25/2016   Migraine 05/06/2016   Diabetes ([name]) 04/02/2016   Nonallopathic lesion of lumbosacral region 01/15/2016   Routine general medical examination at a health care facility 11/25/2015   Nonallopathic lesion of thoracic region 09/26/2014   Nonallopathic lesion-rib cage 09/26/2014   Nonallopathic lesion of cervical region 07/19/2014   Hx of osteopenia 10/11/2012   Seasonal allergic rhinitis 03/21/2012   Fibromyalgia 08/12/2011   Palpitations 04/15/2011   DEPRESSIVE DISORDER NOT ELSEWHERE CLASSIFIED 03/27/2010   Vitamin D deficiency 05/25/2008   Lumbar radiculopathy 04/11/2008   Essential hypertension 03/14/2008   History of cardiovascular disorder 02/02/2008   Hyperlipidemia associated with type 2 diabetes mellitus ([place]) 01/18/2007    Past Medical History:  Diagnosis Date   Acute bursitis of right shoulder 08/29/2018   Right shoulder injected in August 29, 2018   Allergic rhinitis    Closed displaced fracture of fifth metatarsal bone of left foot 03/16/2017   DEPRESSIVE DISORDER NOT ELSEWHERE CLASSIFIED 03/27/2010   Qualifier: Diagnosis of  By: [name] MD, [name]   [name], NP , [facility] [place]     Diabetes ([place]) 04/02/2016   Diabetes mellitus    Essential hypertension 03/14/2008   Qualifier: Diagnosis of  By: [name] MD, [name]     Fibromyalgia 08/12/2011   Diagnosed by [name] , Rheumatologist 2010    GERD (gastroesophageal reflux disease) 12/25/2016   Gluteal tendonitis of right buttock 10/19/2017   Injected in October 19, 2017 Injected October 07, 2018   Greater  trochanteric bursitis of left hip 06/17/2016   Greater trochanteric bursitis of right hip 11/27/2015   Injected 11/27/2015    History of recurrent UTIs    [name]   HTN (hypertension)    Hx of osteopenia 10/11/2012   [facility] DEXA; ordered by [name] Lowest T score: -2.1 @ lumbar spine @ [facility] 11/01/12; decrease of 8.5% vs 05/2009. There is 9.2% risk over 10 yrs History fracture left elbow @ 4 Fractured left wrist , right elbow and nose post fall July/18/2013. Dr. [name] No FH Osteoporosis No PMH of bisphosphonate therapy (generic Fosamax Rxed but not filled  due to polypharmacy )    Hyperlipidemia    Hyperlipidemia associated with type 2 diabetes mellitus ([place]) 01/18/2007   Qualifier: Diagnosis of  By: [name]  With DM LDL goal = < 100, ideally < 70. Mi in father @ 21; 2 brothers @ 9 & 13     Lumbar radiculopathy 04/11/2008   Qualifier: Diagnosis of  By: [name] MD, Erick Blinks well to epidural 01/12/2017  repeat epidural given May 2018   Migraine headache    quiescent   Nonallopathic lesion of cervical region 07/19/2014   Nonallopathic lesion of lumbosacral region 01/15/2016   Nonallopathic lesion of sacral region 06/27/2018   Nonallopathic lesion-rib cage 09/26/2014   Palpitations 04/15/2011   Rotator cuff impingement syndrome of left shoulder 05/11/2014   Seasonal allergic rhinitis 03/21/2012   Sinusitis, chronic 04/20/2016   Sleep disorder    Dr Dohmier   Subluxation of peroneal tendon of right foot 11/10/2017   TIA (transient ischemic attack)    [place], HX OF 02/02/2008   Qualifier: Diagnosis of  By: Donalee Citrin ADVRS EFF UNS RX MEDICINAL&BIOLOGICAL [place] 02/02/2008   Qualifier: Diagnosis of  By: [name] MD, Posey Pronto AND MYOSITIS 02/02/2008   Qualifier: Diagnosis of  By: [name] MD, [name]     UTI (urinary tract infection) 10/05/2014   Viral upper respiratory tract infection with cough 12/31/2014   Vitamin D deficiency  05/25/2008   Qualifier: Diagnosis of  By: [name] MD, [name]      Past Surgical History:  Procedure Laterality Date   COLONOSCOPY     Dr Carlean [name]; hemorrhoids   CORONARY ANGIOPLASTY  1997   for chest pain- negative    POLYPECTOMY  2002   benign, hyperplastic polyp; rectal bleeding 2008   TONSILLECTOMY     TOTAL ABDOMINAL HYSTERECTOMY  1983   BSO for endometriosis   WISDOM TOOTH EXTRACTION      Social History   Socioeconomic History   Marital status: Married    Spouse name: Not on file   Number of children: Not on file   Years of education: Not on file   Highest education level: Not on file  Occupational History   Occupation: Research scientist (physical sciences)     Employer: [employer]  Tobacco Use   Smoking status: Never   Smokeless tobacco: Never  Vaping Use   Vaping Use: Never used  Substance and Sexual Activity   Alcohol use: No   Drug use: No   Sexual activity: Not on file  Other Topics Concern   Not on file  Social History Narrative   Regular exercise- no    Social Determinants of Health   Financial Resource Strain: Low Risk    Difficulty of Paying Living Expenses: Not hard at all  Food Insecurity: No Food Insecurity   Worried About Charity fundraiser in the Last Year: Never true   Arboriculturist in the Last Year: Never true  Transportation Needs: No Transportation Needs   Lack of Transportation (Medical): No   Lack of Transportation (Non-Medical): No  Physical Activity: Inactive   Days of Exercise per Week: 0 days   Minutes of Exercise per Session: 0 min  Stress: No Stress Concern Present   Feeling of Stress : Not at all  Social Connections: Socially Integrated   Frequency of Communication with Friends and Family: More than three times a week   Frequency of Social Gatherings with Friends and Family: More than three times a week   Attends Religious Services: More than 4 times per year   Active Member of [employer] or Organizations: Yes   Attends Programme researcher, broadcasting/film/video: More than 4 times per year   Marital Status: Married  Human resources officer Violence: Not At Risk   Fear of Current or Ex-Partner:  No   Emotionally Abused: No   Physically Abused: No   Sexually Abused: No    Family History  Problem Relation Age of Onset   Colon cancer Maternal Aunt    Diabetes Paternal Uncle    Heart attack Father 9   COPD Mother    Lung cancer Brother        smoker   Mental illness Other        niece, committed suicide.   Heart attack Other        brother X 2; @ 62 & 49   Stroke Neg Hx      Review of Systems  Constitutional: Negative.  Negative for chills and fever.  HENT:  Positive for congestion and sinus pain. Negative for sore throat.   Respiratory:  Negative for cough and shortness of breath.   Cardiovascular: Negative.   Gastrointestinal:  Negative for abdominal pain, diarrhea, nausea and vomiting.  Skin: Negative.  Negative for rash.  Neurological:  Positive for headaches.  All other systems reviewed and are negative.  Today's Vitals   02/09/22 1341  BP: 130/78  Pulse: 96  Temp: 97.6 F (36.4 C)  TempSrc: Oral  SpO2: 96%  Weight: 145 lb 4 oz (65.9 kg)  Height: 5\' 6"  (1.676 m)   Body mass index is 23.44 kg/m.  Physical Exam Vitals reviewed.  Constitutional:      Appearance: She is well-developed.  HENT:     Head: Normocephalic.     Right Ear: Tympanic membrane, ear canal and external ear normal.     Left Ear: Tympanic membrane, ear canal and external ear normal.     Nose: Congestion present.     Right Sinus: Maxillary sinus tenderness present.     Left Sinus: Maxillary sinus tenderness present.     Mouth/Throat:     Mouth: Mucous membranes are moist.     Pharynx: Oropharynx is clear.  Eyes:     Extraocular Movements: Extraocular movements intact.     Conjunctiva/sclera: Conjunctivae normal.     Pupils: Pupils are equal, round, and reactive to light.  Cardiovascular:     Rate and Rhythm: Normal rate and  regular rhythm.     Pulses: Normal pulses.     Heart sounds: Normal heart sounds.  Pulmonary:     Effort: Pulmonary effort is normal.     Breath sounds: Normal breath sounds.  Musculoskeletal:     Cervical back: No tenderness.  Lymphadenopathy:     Cervical: No cervical adenopathy.  Skin:    General: Skin is warm and dry.     Capillary Refill: Capillary refill takes less than 2 seconds.  Neurological:     General: No focal deficit present.     Mental Status: She is alert and oriented to person, place, and time.  Psychiatric:        Mood and Affect: Mood normal.        Behavior: Behavior normal.     ASSESSMENT & PLAN: Clinical stable.  No red flag signs or symptoms. Viral infection with secondary bacterial infection most likely. May benefit from antibiotics.  History of sepsis in the past from possible UTI. Advised to rest and stay well-hydrated. Contact the office if no better or worse during the next several days.  Problem List Items Addressed This Visit   None Visit Diagnoses     Acute recurrent maxillary sinusitis    -  Primary   Relevant Medications   amoxicillin-clavulanate (AUGMENTIN) 875-125 MG tablet  Sinus congestion       Sinus headache          Patient Instructions  Sinusitis, Adult Sinusitis is soreness and swelling (inflammation) of your sinuses. Sinuses are hollow spaces in the bones around your face. They are located: Around your eyes. In the middle of your forehead. Behind your nose. In your cheekbones. Your sinuses and nasal passages are lined with a fluid called mucus. Mucus drains out of your sinuses. Swelling can trap mucus in your sinuses. This lets germs (bacteria, virus, or fungus) grow, which leads to infection. Most of the time, this condition is caused by a virus. What are the causes? This condition is caused by: Allergies. Asthma. Germs. Things that block your nose or sinuses. Growths in the nose (nasal polyps). Chemicals or  irritants in the air. Fungus (rare). What increases the risk? You are more likely to develop this condition if: You have a weak body defense system (immune system). You do a lot of swimming or diving. You use nasal sprays too much. You smoke. What are the signs or symptoms? The main symptoms of this condition are pain and a feeling of pressure around the sinuses. Other symptoms include: Stuffy nose (congestion). Runny nose (drainage). Swelling and warmth in the sinuses. Headache. Toothache. A cough that may get worse at night. Mucus that collects in the throat or the back of the nose (postnasal drip). Being unable to smell and taste. Being very tired (fatigue). A fever. Sore throat. Bad breath. How is this diagnosed? This condition is diagnosed based on: Your symptoms. Your medical history. A physical exam. Tests to find out if your condition is short-term (acute) or long-term (chronic). Your doctor may: Check your nose for growths (polyps). Check your sinuses using a tool that has a light (endoscope). Check for allergies or germs. Do imaging tests, such as an MRI or CT scan. How is this treated? Treatment for this condition depends on the cause and whether it is short-term or long-term. If caused by a virus, your symptoms should go away on their own within 10 days. You may be given medicines to relieve symptoms. They include: Medicines that shrink swollen tissue in the nose. Medicines that treat allergies (antihistamines). A spray that treats swelling of the nostrils.  Rinses that help get rid of thick mucus in your nose (nasal saline washes). If caused by bacteria, your doctor may wait to see if you will get better without treatment. You may be given antibiotic medicine if you have: A very bad infection. A weak body defense system. If caused by growths in the nose, you may need to have surgery. Follow these instructions at home: Medicines Take, use, or apply  over-the-counter and prescription medicines only as told by your doctor. These may include nasal sprays. If you were prescribed an antibiotic medicine, take it as told by your doctor. Do not stop taking the antibiotic even if you start to feel better. Hydrate and humidify  Drink enough water to keep your pee (urine) pale yellow. Use a cool mist humidifier to keep the humidity level in your home above 50%. Breathe in steam for 10-15 minutes, 3-4 times a day, or as told by your doctor. You can do this in the bathroom while a hot shower is running. Try not to spend time in cool or dry air. Rest Rest as much as you can. Sleep with your head raised (elevated). Make sure you get enough sleep each night. General instructions  Put a  warm, moist washcloth on your face 3-4 times a day, or as often as told by your doctor. This will help with discomfort. Wash your hands often with soap and water. If there is no soap and water, use hand sanitizer. Do not smoke. Avoid being around people who are smoking (secondhand smoke). Keep all follow-up visits as told by your doctor. This is important. Contact a doctor if: You have a fever. Your symptoms get worse. Your symptoms do not get better within 10 days. Get help right away if: You have a very bad headache. You cannot stop throwing up (vomiting). You have very bad pain or swelling around your face or eyes. You have trouble seeing. You feel confused. Your neck is stiff. You have trouble breathing. Summary Sinusitis is swelling of your sinuses. Sinuses are hollow spaces in the bones around your face. This condition is caused by tissues in your nose that become inflamed or swollen. This traps germs. These can lead to infection. If you were prescribed an antibiotic medicine, take it as told by your doctor. Do not stop taking it even if you start to feel better. Keep all follow-up visits as told by your doctor. This is important. This information is not  intended to replace advice given to you by your health care provider. Make sure you discuss any questions you have with your health care provider. Document Revised: 05/02/2018 Document Reviewed: 05/02/2018 Elsevier Patient Education  2022 [place], MD [facility]

## 2022-02-16 ENCOUNTER — Telehealth: Payer: Self-pay

## 2022-02-16 ENCOUNTER — Ambulatory Visit (INDEPENDENT_AMBULATORY_CARE_PROVIDER_SITE_OTHER): Payer: Medicare Other

## 2022-02-16 DIAGNOSIS — E785 Hyperlipidemia, unspecified: Secondary | ICD-10-CM

## 2022-02-16 DIAGNOSIS — E1169 Type 2 diabetes mellitus with other specified complication: Secondary | ICD-10-CM

## 2022-02-16 DIAGNOSIS — I10 Essential (primary) hypertension: Secondary | ICD-10-CM

## 2022-02-16 DIAGNOSIS — E119 Type 2 diabetes mellitus without complications: Secondary | ICD-10-CM

## 2022-02-16 NOTE — Progress Notes (Signed)
Chronic Care Management Pharmacy Note  02/16/2022 Name:  Christine [name] MRN:  [id number] DOB:  December 23, 1950  Summary: -Patient reports compliance to current medications, reports that BG has been stable since last visit, basaglar discontinued and ozmepic increased  -Used last dose of ozempic, waiting on shipment from [facility] - no update on delivery date at this time (PAP completed through endocrine office) -Knee surgery scheduled for 3/16 - repair torn meniscus   Recommendations/Changes made from today's visit: -Recommending no changes to medications -Advised for patient to call [facility] to request a voucher for her ozempic so she does not have a gap in her therapy -Patient in contact with endo office - plan to restart basaglar if she does not receive ozempic before she is due for another dose -has f/u with endo at the end of this week  Subjective: Christine [name] is an 71 y.o. year old female who is a primary patient of Myrlene Broker, MD.  The CCM team was consulted for assistance with disease management and care coordination needs.    Engaged with patient by telephone for follow up visit in response to provider referral for pharmacy case management and/or care coordination services.   Consent to Services:  The patient was given the following information about Chronic Care Management services today, agreed to services, and gave verbal consent: 1. CCM service includes personalized support from designated clinical staff supervised by the primary care provider, including individualized plan of care and coordination with other care providers 2. 24/7 contact phone numbers for assistance for urgent and routine care needs. 3. Service will only be billed when office clinical staff spend 20 minutes or more in a month to coordinate care. 4. Only one practitioner may furnish and bill the service in a calendar month. 5.The patient may stop CCM services at any time (effective at the  end of the month) by phone call to the office staff. 6. The patient will be responsible for cost sharing (co-pay) of up to 20% of the service fee (after annual deductible is met). Patient agreed to services and consent obtained.  Patient Care Team: Myrlene Broker, MD as PCP - General (Internal Medicine) Lyn Records, MD as PCP - Cardiology (Cardiology) Drema Dallas, DO as Consulting Physician (Neurology) Ellin Saba, [facility] (Pharmacist)  Recent office visits: 02/09/2022 - Dr. Alvy Bimler - sinus pressure and HA rx'd augmentin  01/08/2022 Jiles Prows NP - HA - prescribed tramadol prn   Recent consult visits: 01/08/2022 - Dr. Katrinka Blazing - Sports Medicine - exacerbation of neck pain / knee pain- given toradol injection  and depo-medrol injection  12/18/2021 - Dr. Everardo All - Endocrinology - increase ozempic to 0.5mg  weekly - stop basaglar  11/19/2021 - Dr. Katrinka Blazing - Sports Medicine - left knee, back, and neck pain - possible shingles infection - rx'd acyclovir   Hospital visits: None since last visit   Objective:  Lab Results  Component Value Date   CREATININE 0.93 08/19/2021   BUN 17 08/19/2021   GFR 61.53 08/19/2021   GFRNONAA >60 08/09/2021   GFRAA >60 06/09/2020   NA 137 08/19/2021   K 3.8 08/19/2021   CALCIUM 9.5 08/19/2021   CO2 27 08/19/2021   GLUCOSE 132 (H) 08/19/2021    Lab Results  Component Value Date/Time   HGBA1C 7.1 (A) 12/18/2021 01:07 PM   HGBA1C 6.3 (A) 09/23/2021 10:54 AM   HGBA1C 6.6 (H) 05/24/2015 11:37 AM   HGBA1C 8.3 (H) 02/22/2015 11:24 AM  GFR 61.53 08/19/2021 03:46 PM   GFR 87.14 08/14/2021 09:10 AM   MICROALBUR 10 04/23/2020 03:47 PM   MICROALBUR 1.1 11/21/2014 10:40 AM   MICROALBUR 0.6 08/08/2014 12:07 PM    Last diabetic Eye exam:  Lab Results  Component Value Date/Time   HMDIABEYEEXA No Retinopathy 05/18/2018 10:01 AM    Last diabetic Foot exam:  No results found for: HMDIABFOOTEX   Lab Results  Component Value Date   CHOL 164  01/27/2021   HDL 45.60 01/27/2021   LDLCALC 96 01/27/2021   TRIG 112.0 01/27/2021   CHOLHDL 4 01/27/2021    Hepatic Function Latest Ref Rng & Units 08/19/2021 08/14/2021 08/08/2021  Total Protein 6.0 - 8.3 g/dL 7.0 7.2 5.6(L)  Albumin 3.5 - 5.2 g/dL 3.8 3.8 2.8(L)  AST 0 - 37 U/L 15 21 14(L)  ALT 0 - 35 U/L 25 37(H) 20  Alk Phosphatase 39 - 117 U/L 70 61 55  Total Bilirubin 0.2 - 1.2 mg/dL 0.3 0.5 0.8  Bilirubin, Direct 0.0 - 0.3 mg/dL - - -    Lab Results  Component Value Date/Time   TSH 1.58 04/21/2021 01:52 PM   TSH 2.23 01/05/2020 11:35 AM   FREET4 0.91 03/13/2011 04:36 PM    CBC Latest Ref Rng & Units 08/19/2021 08/14/2021 08/09/2021  WBC 4.0 - 10.5 K/uL 7.6 6.7 4.5  Hemoglobin 12.0 - 15.0 g/dL 12.1 12.1 10.2(L)  Hematocrit 36.0 - 46.0 % 36.3 36.9 30.3(L)  Platelets 150.0 - 400.0 K/uL 436.0(H) 392.0 127(L)    Lab Results  Component Value Date/Time   VD25OH 43.41 04/21/2021 01:52 PM   VD25OH 48.28 04/23/2020 01:31 PM    Clinical ASCVD: No  The 10-year ASCVD risk score (Arnett DK, et al., 2019) is: 28%   Values used to calculate the score:     Age: 52 years     Sex: Female     Is Non-Hispanic African American: No     Diabetic: Yes     Tobacco smoker: No     Systolic Blood Pressure: 151 mmHg     Is BP treated: Yes     HDL Cholesterol: 45.6 mg/dL     Total Cholesterol: 164 mg/dL    Depression screen [facility] 2/9 02/09/2022 06/04/2021 04/21/2021  Decreased Interest 0 0 0  Down, Depressed, Hopeless 0 0 0  PHQ - 2 Score 0 0 0  Some recent data might be hidden    Social History   Tobacco Use  Smoking Status Never  Smokeless Tobacco Never   BP Readings from Last 3 Encounters:  02/09/22 130/78  01/08/22 (!) 142/86  01/08/22 123/85   Pulse Readings from Last 3 Encounters:  02/09/22 96  01/08/22 85  01/08/22 97   Wt Readings from Last 3 Encounters:  02/09/22 145 lb 4 oz (65.9 kg)  01/08/22 150 lb (68 kg)  01/08/22 150 lb (68 kg)   BMI Readings from Last 3  Encounters:  02/09/22 23.44 kg/m  01/08/22 24.21 kg/m  01/08/22 24.21 kg/m    Assessment/Interventions: Review of patient past medical history, allergies, medications, health status, including review of consultants reports, laboratory and other test data, was performed as part of comprehensive evaluation and provision of chronic care management services.   SDOH:  (Social Determinants of Health) assessments and interventions performed: Yes  SDOH Screenings   Alcohol Screen: Low Risk    Last Alcohol Screening Score (AUDIT): 0  Depression (PHQ2-9): Low Risk    PHQ-2 Score: 0  Financial Resource  Strain: Low Risk    Difficulty of Paying Living Expenses: Not hard at all  Food Insecurity: No Food Insecurity   Worried About Charity fundraiser in the Last Year: Never true   Ran Out of Food in the Last Year: Never true  Housing: [place] Risk Score: 0  Physical Activity: Inactive   Days of Exercise per Week: 0 days   Minutes of Exercise per Session: 0 min  Social Connections: Engineer, building services of Communication with Friends and Family: More than three times a week   Frequency of Social Gatherings with Friends and Family: More than three times a week   Attends Religious Services: More than 4 times per year   Active Member of [employer] or Organizations: Yes   Attends Music therapist: More than 4 times per year   Marital Status: Married  Stress: No Stress Concern Present   Feeling of Stress : Not at all  Tobacco Use: Low Risk    Smoking Tobacco Use: Never   Smokeless Tobacco Use: Never   Passive Exposure: Not on file  Transportation Needs: No Transportation Needs   Lack of Transportation (Medical): No   Lack of Transportation (Non-Medical): No    CCM Care Plan  Allergies  Allergen Reactions   Vioxx [Rofecoxib] Other (See Comments)    Excess BP which caused TIA    Prednisone Other (See Comments)    Mental status changes with high-dose  oral agent. Tolerates epidural steroid injections. No associated rash or fever   Macrodantin [Nitrofurantoin Macrocrystal] Hives   Sulfa Antibiotics     Hives   Colesevelam Other (See Comments)    Leg Pain    Medications Reviewed Today     Reviewed by Horald Pollen, MD (Physician) on 02/09/22 at [place] List Status: <None>   Medication Order Taking? Sig Documenting Provider Last Dose Status Informant  acetaminophen (TYLENOL) 500 MG tablet [id number] Yes Take 500 mg by mouth as needed. [provider] Taking Active   acyclovir (ZOVIRAX) 400 MG tablet [id number] Yes Take 1 tablet (400 mg total) by mouth 3 (three) times daily. Lyndal Pulley, DO Taking Active   amoxicillin-clavulanate (AUGMENTIN) 875-125 MG tablet [id number] Yes Take 1 tablet by mouth 2 (two) times daily for 7 days. Horald Pollen, MD  Active   aspirin 81 MG tablet [id number] Yes Take 81 mg by mouth daily. [provider] Taking Active Spouse/Significant Other  atorvastatin (LIPITOR) 40 MG tablet [id number] Yes TAKE 1 TABLET BY MOUTH EVERY DAY [name], MD Taking Active   BD PEN NEEDLE NANO 2ND GEN 32G X 4 MM MISC [id number] Yes USE ONCE A DAY AS DIRECTED Renato Shin, MD Taking Active Spouse/Significant Other  busPIRone (BUSPAR) 15 MG tablet [id number] Yes Take 15 mg by mouth 3 (three) times daily. [provider] Taking Active   DULoxetine (CYMBALTA) 60 MG capsule [id number] Yes Take 60 mg by mouth daily. [provider] Taking Active Spouse/Significant Other           Med Note Thomes Cake   Thu Aug 14, 2021  8:38 AM)    Lancets Glory Rosebush ULTRASOFT) lancets [id number] Yes Check blood sugar once daily. Dx code: 77.00 Hendricks Limes, MD Taking Active Spouse/Significant Other           Med Note Alfredia Ferguson A   Mon Apr 21, 2021  1:09 PM) use  LORazepam (ATIVAN) 0.5 MG tablet  [id number] Yes Take 0.5 mg by mouth as needed for anxiety. [provider]  Taking Active Spouse/Significant Other           Med Note Thomes Cake   Thu Aug 14, 2021  8:36 AM)    losartan (COZAAR) 50 MG tablet [id number] Yes Take 1 tablet (50 mg total) by mouth daily. Belva Crome, MD Taking Active   omeprazole (PRILOSEC) 20 MG capsule [id number] Yes TAKE 1 CAPSULE BY MOUTH EVERY DAY [name], MD Taking Active   [facility] ULTRA test strip [id number] Yes USE 1 STRIP TWICE DAILY DX CODE E11.65 Renato Shin, MD Taking Active Spouse/Significant Other  promethazine (PHENERGAN) 12.5 MG tablet [id number] Yes Take 1 tablet (12.5 mg total) by mouth every 8 (eight) hours as needed for nausea or vomiting. [name], MD Taking Active Spouse/Significant Other  Semaglutide, 2 MG/DOSE, (OZEMPIC, 2 MG/DOSE,) 8 MG/3ML SOPN [id number] Yes Inject 2 mg into the skin once a week. Gets from pt assist Renato Shin, MD Taking Active   tiZANidine (ZANAFLEX) 2 MG tablet [id number] Yes TAKE 1 TABLET BY MOUTH AT BEDTIME. Lyndal Pulley, DO Taking Active   traZODone (DESYREL) 50 MG tablet [id number] Yes TAKE 1/2 TO 1 TABLET BY MOUTH AT BEDTIME AS NEEDED FOR SLEEP [name], MD Taking Active   vitamin B-12 (CYANOCOBALAMIN) 1000 MCG tablet [id number] Yes Take 1,000 mcg by mouth daily. [provider] Taking Active   Vitamin D, Ergocalciferol, (DRISDOL) 1.25 MG (50000 UNIT) CAPS capsule [id number] Yes TAKE 1 CAPSULE BY MOUTH ONE TIME PER WEEK Lyndal Pulley, DO Taking Active             Patient Active Problem List   Diagnosis Date Noted   Rosacea 01/08/2022   Seborrheic keratosis 01/08/2022   Baker's cyst of knee, left 10/09/2021   Acute medial meniscus tear of left knee 09/02/2021   Bacteremia 08/15/2021   Bacteria in urine 08/07/2021   Urinary hesitancy 08/07/2021   Elevated troponin 08/07/2021   Diabetes mellitus type 2 in nonobese ([place]) 08/07/2021   Tachycardia 08/07/2021   Sepsis ([place]) 08/06/2021   Dizziness 04/22/2021   Arthritis of  right hip 02/12/2021   Leg swelling 07/12/2020   Cervical disc disorder with radiculopathy of cervical region 01/25/2020   Coronary atherosclerosis of native coronary artery 01/25/2020   Subluxation of peroneal tendon of right foot 11/10/2017   Lumbar radiculopathy, right 06/23/2017   Closed displaced fracture of fifth metatarsal bone of left foot 03/16/2017   GERD (gastroesophageal reflux disease) 12/25/2016   Migraine 05/06/2016   Diabetes ([place]) 04/02/2016   Nonallopathic lesion of lumbosacral region 01/15/2016   Routine general medical examination at a health care facility 11/25/2015   Nonallopathic lesion of thoracic region 09/26/2014   Nonallopathic lesion-rib cage 09/26/2014   Nonallopathic lesion of cervical region 07/19/2014   Hx of osteopenia 10/11/2012   Seasonal allergic rhinitis 03/21/2012   Fibromyalgia 08/12/2011   Palpitations 04/15/2011   DEPRESSIVE DISORDER NOT ELSEWHERE CLASSIFIED 03/27/2010   Vitamin D deficiency 05/25/2008   Lumbar radiculopathy 04/11/2008   Essential hypertension 03/14/2008   History of cardiovascular disorder 02/02/2008   Hyperlipidemia associated with type 2 diabetes mellitus ([place]) 01/18/2007    Immunization History  Administered Date(s) Administered   Fluad Quad(high Dose 65+) 08/11/2019, 11/27/2020   Influenza Split 10/13/2011, 10/11/2012   Influenza Whole 11/17/2007, 09/26/2010   Influenza, High Dose Seasonal PF 09/10/2016, 09/30/2017, 09/08/2018   Influenza,inj,Quad PF,6+ Mos 11/07/2013, 08/29/2014, 09/13/2015   PFIZER(Purple  Top)SARS-COV-2 Vaccination 02/11/2020, 03/12/2020, 02/06/2021   Pneumococcal Conjugate-13 12/24/2016   Pneumococcal Polysaccharide-23 02/08/2019   Td 06/07/2009    Conditions to be addressed/monitored:  Hypertension, Hyperlipidemia and Diabetes  Care Plan : [place]  Updates made by Tomasa Blase, RPH since 02/16/2022 12:00 AM     Problem: Hypertension, Hyperlipidemia and Diabetes    Priority: High     Long-Range Goal: Disease management   Start Date: 01/31/2021  Expected End Date: 02/17/2023  This Visit's Progress: On track  Recent Progress: On track  Priority: High  Note:   Current Barriers:  Unable to independently afford treatment regimen Unable to independently monitor therapeutic efficacy  Pharmacist Clinical Goal(s):  patient will verbalize ability to afford treatment regimen achieve adherence to monitoring guidelines and medication adherence to achieve therapeutic efficacy through collaboration with PharmD and provider.   Interventions: 1:1 collaboration with [name], MD regarding development and update of comprehensive plan of care as evidenced by provider attestation and co-signature Inter-disciplinary care team collaboration (see longitudinal plan of care) Comprehensive medication review performed; medication list updated in electronic medical record  Hypertension (BP goal < 130/80) Controlled BP Readings from Last 3 Encounters:  02/09/22 130/78  01/08/22 (!) 142/86  01/08/22 123/85  Current regimen:  Losartan 50 mg daily Interventions: Discussed BP goals and benefits of medications for prevention of heart attack / stroke Discussed white coat syndrome and importance of checking BP at home Patient self care activities Check BP 1-2 times weekly, document, and provide at future appointments Ensure daily salt intake < 2300 mg/day   Hyperlipidemia (LDL goal < 100) Controlled Lab Results  Component Value Date   LDLCALC 96 01/27/2021  Current regimen:  Atorvastatin 80 mg daily Aspirin 81 mg daily Interventions: Discussed cholesterol goals and benefits of medications for prevention of heart attack / stroke Discussed foods high in cholesterol to avoid Patient self care activities -patient will: Continue current medications Reduce cholesterol in diet   Diabetes (A1c goal < 7%) Controlled  Lab Results  Component Value Date    HGBA1C 7.1 (A) 12/18/2021  Current regimen:  Ozempic $Remove'2mg'rqKJwOc$  weekly (has not started $RemoveBef'2mg'pNFtLjcdSA$  weekly dose as of this time, will start with next delivery from [facility]) Interventions: Discussed blood sugar goals and benefits of medications for prevention of heart attack / stroke Patient self care activities   Check blood sugar once daily, document, and provide at future appointments Contact provider with any episodes of hypoglycemia  Patient Goals/Self-Care Activities - take medications as prescribed focus on medication adherence by pill box check glucose daily, document, and provide at future appointments  Follow Up Plan: Telephone follow up appointment with care management team member scheduled for: 6 months    Care Gaps: Shingles, Tdap, COVID booster, Mammogram, Ophthalmology Exam, and Colonoscopy   Patient's preferred pharmacy is:  [id number] - [place], [place] - [address] [place] [address] Phone: [phone] Fax: [phone]  [place], [place] - [address] [place] [address] Phone: [phone] Fax: [phone]   Uses pill box? Yes Pt endorses 100% compliance  Care Plan and Follow Up Patient Decision:  Patient agrees to Care Plan and Follow-up.  Plan: Telephone follow up appointment with care management team member scheduled for:  6 months  The patient has been provided with contact information for the care management team and has been advised to call with any health related questions or concerns.  Tomasa Blase, PharmD Clinical Pharmacist, [place]

## 2022-02-16 NOTE — Telephone Encounter (Signed)
Pt stated that she was out of medication. She contacted the Pt Assistance program and they are running 3/4 weeks behind. Pt has an appt with Loanne Drilling 02/20/2022 and will discuss then. ?

## 2022-02-16 NOTE — Patient Instructions (Signed)
Visit Information ? ?Following are the goals we discussed today:  ? ?Manage My Medicine  ? ?Timeframe:  Long-Range Goal ?Priority:  Medium ?Start Date: 11/10/2021                            ?Expected End Date:  11/10/2022                    ? ?Follow Up Date 08/2022 ?  ?- call for medicine refill 2 or 3 days before it runs out ?- keep a list of all the medicines I take; vitamins and herbals too ?- learn to read medicine labels ?- use a pillbox to sort medicine  ?  ?Why is this important?   ?These steps will help you keep on track with your medicines. ? ?Plan: Telephone follow up appointment with care management team member scheduled for:  6 months ?The patient has been provided with contact information for the care management team and has been advised to call with any health related questions or concerns.  ? ?Tomasa Blase, PharmD ?Clinical Pharmacist, [place]  ? ?Please call the care guide team at [phone] if you need to cancel or reschedule your appointment.  ? ?Patient verbalizes understanding of instructions and care plan provided today and agrees to view in [place]. Active MyChart status confirmed with patient.   ? ?

## 2022-02-20 ENCOUNTER — Other Ambulatory Visit: Payer: Self-pay

## 2022-02-20 ENCOUNTER — Ambulatory Visit (INDEPENDENT_AMBULATORY_CARE_PROVIDER_SITE_OTHER): Payer: Medicare Other | Admitting: Endocrinology

## 2022-02-20 VITALS — BP 140/80 | HR 94 | Ht 66.0 in | Wt 146.6 lb

## 2022-02-20 DIAGNOSIS — E119 Type 2 diabetes mellitus without complications: Secondary | ICD-10-CM

## 2022-02-20 LAB — POCT GLYCOSYLATED HEMOGLOBIN (HGB A1C): Hemoglobin A1C: 7.2 % — AB (ref 4.0–5.6)

## 2022-02-20 NOTE — Patient Instructions (Addendum)
check your blood sugar twice a day.  vary the time of day when you check, between before the 3 meals, and at bedtime.  also check if you have symptoms of your blood sugar being too high or too low.  please keep a record of the readings and bring it to your next appointment here.  You can write it on any piece of paper.  please call us sooner if your blood sugar goes below 70, or if you have a lot of readings over 200.   ?Please continue the same Ozempic. In obtaining it, all you can do is your best.  If they offer you a voucher, you should accept that, too.   ?Please come back for a follow-up appointment in 2 months.   ? ?

## 2022-02-20 NOTE — Progress Notes (Signed)
Subjective:    Patient ID: Christine [name], female    DOB: 04-02-1951, 71 y.o.   MRN: [id number]  HPI Pt returns for f/u of diabetes mellitus:  DM type: 2 Dx'ed: 5188 Complications: TIA Therapy: Ozempic.   GDM: never.   DKA: never.   Severe hypoglycemia: never.  Pancreatitis: never.   SDOH she gets meds from pt assist Other: She declined multiple daily injections; pioglitizone was for NASH, but she stopped due to edema; she declines additional DM rx. She took insulin 2014-2023.  Interval history: she brings a record of her cbg's which I have reviewed today.  Cbg varies from 104-148.  Ozempic supply from pt assist has been inconsistent.   Past Medical History:  Diagnosis Date   Acute bursitis of right shoulder 08/29/2018   Right shoulder injected in August 29, 2018   Allergic rhinitis    Closed displaced fracture of fifth metatarsal bone of left foot 03/16/2017   DEPRESSIVE DISORDER NOT ELSEWHERE CLASSIFIED 03/27/2010   Qualifier: Diagnosis of  By: [name] MD, [name]   [name], NP , [facility] [place]     Diabetes ([place]) 04/02/2016   Diabetes mellitus    Essential hypertension 03/14/2008   Qualifier: Diagnosis of  By: [name] MD, [name]     Fibromyalgia 08/12/2011   Diagnosed by [name] , Rheumatologist 2010    GERD (gastroesophageal reflux disease) 12/25/2016   Gluteal tendonitis of right buttock 10/19/2017   Injected in October 19, 2017 Injected October 07, 2018   Greater trochanteric bursitis of left hip 06/17/2016   Greater trochanteric bursitis of right hip 11/27/2015   Injected 11/27/2015    History of recurrent UTIs    [name]   HTN (hypertension)    Hx of osteopenia 10/11/2012   [facility] DEXA; ordered by [name] Lowest T score: -2.1 @ lumbar spine @ [facility] 11/01/12; decrease of 8.5% vs 05/2009. There is 9.2% risk over 10 yrs History fracture left elbow @ 6 Fractured left wrist , right elbow and nose post fall July/18/2013. Dr. [name] No FH  Osteoporosis No PMH of bisphosphonate therapy (generic Fosamax Rxed but not filled  due to polypharmacy )    Hyperlipidemia    Hyperlipidemia associated with type 2 diabetes mellitus ([place]) 01/18/2007   Qualifier: Diagnosis of  By: [name]  With DM LDL goal = < 100, ideally < 70. Mi in father @ 19; 2 brothers @ 51 & 25     Lumbar radiculopathy 04/11/2008   Qualifier: Diagnosis of  By: [name] MD, Erick Blinks well to epidural 01/12/2017  repeat epidural given May 2018   Migraine headache    quiescent   Nonallopathic lesion of cervical region 07/19/2014   Nonallopathic lesion of lumbosacral region 01/15/2016   Nonallopathic lesion of sacral region 06/27/2018   Nonallopathic lesion-rib cage 09/26/2014   Palpitations 04/15/2011   Rotator cuff impingement syndrome of left shoulder 05/11/2014   Seasonal allergic rhinitis 03/21/2012   Sinusitis, chronic 04/20/2016   Sleep disorder    Dr Dohmier   Subluxation of peroneal tendon of right foot 11/10/2017   TIA (transient ischemic attack)    [place], HX OF 02/02/2008   Qualifier: Diagnosis of  By: Donalee Citrin ADVRS EFF UNS RX MEDICINAL&BIOLOGICAL [place] 02/02/2008   Qualifier: Diagnosis of  By: [name] MD, Posey Pronto AND MYOSITIS 02/02/2008   Qualifier: Diagnosis of  By: [name] MD,  [name]     UTI (urinary tract infection) 10/05/2014   Viral upper respiratory tract infection with cough 12/31/2014   Vitamin D deficiency 05/25/2008   Qualifier: Diagnosis of  By: [name] MD, [name]      Past Surgical History:  Procedure Laterality Date   COLONOSCOPY     Dr Carlean [name]; hemorrhoids   CORONARY ANGIOPLASTY  1997   for chest pain- negative    POLYPECTOMY  2002   benign, hyperplastic polyp; rectal bleeding 2008   TONSILLECTOMY     TOTAL ABDOMINAL HYSTERECTOMY  1983   BSO for endometriosis   WISDOM TOOTH EXTRACTION      Social History   Socioeconomic History   Marital status: Married    Spouse  name: Not on file   Number of children: Not on file   Years of education: Not on file   Highest education level: Not on file  Occupational History   Occupation: Research scientist (physical sciences)     Employer: [employer]  Tobacco Use   Smoking status: Never   Smokeless tobacco: Never  Vaping Use   Vaping Use: Never used  Substance and Sexual Activity   Alcohol use: No   Drug use: No   Sexual activity: Not on file  Other Topics Concern   Not on file  Social History Narrative   Regular exercise- no    Social Determinants of Health   Financial Resource Strain: Low Risk    Difficulty of Paying Living Expenses: Not hard at all  Food Insecurity: No Food Insecurity   Worried About Charity fundraiser in the Last Year: Never true   [place] in the Last Year: Never true  Transportation Needs: No Transportation Needs   Lack of Transportation (Medical): No   Lack of Transportation (Non-Medical): No  Physical Activity: Inactive   Days of Exercise per Week: 0 days   Minutes of Exercise per Session: 0 min  Stress: No Stress Concern Present   Feeling of Stress : Not at all  Social Connections: Socially Integrated   Frequency of Communication with Friends and Family: More than three times a week   Frequency of Social Gatherings with Friends and Family: More than three times a week   Attends Religious Services: More than 4 times per year   Active Member of [employer] or Organizations: Yes   Attends Music therapist: More than 4 times per year   Marital Status: Married  Human resources officer Violence: Not At Risk   Fear of Current or Ex-Partner: No   Emotionally Abused: No   Physically Abused: No   Sexually Abused: No    Current Outpatient Medications on File Prior to Visit  Medication Sig Dispense Refill   acetaminophen (TYLENOL) 500 MG tablet Take 500 mg by mouth as needed.     aspirin 81 MG tablet Take 81 mg by mouth daily.     atorvastatin (LIPITOR) 40 MG tablet TAKE  1 TABLET BY MOUTH EVERY DAY 90 tablet 0   BD PEN NEEDLE NANO 2ND GEN 32G X 4 MM MISC USE ONCE A DAY AS DIRECTED 100 each 3   busPIRone (BUSPAR) 15 MG tablet Take 15 mg by mouth 3 (three) times daily.     DULoxetine (CYMBALTA) 60 MG capsule Take 60 mg by mouth daily.     Lancets (ONETOUCH ULTRASOFT) lancets Check blood sugar once daily. Dx code: 250.00 100 each 12   LORazepam (ATIVAN) 0.5 MG tablet Take 0.5 mg by mouth  as needed for anxiety.     losartan (COZAAR) 50 MG tablet Take 1 tablet (50 mg total) by mouth daily. 90 tablet 3   omeprazole (PRILOSEC) 20 MG capsule TAKE 1 CAPSULE BY MOUTH EVERY DAY 90 capsule 2   ONETOUCH ULTRA test strip USE 1 STRIP TWICE DAILY DX CODE E11.65 100 strip 3   promethazine (PHENERGAN) 12.5 MG tablet Take 1 tablet (12.5 mg total) by mouth every 8 (eight) hours as needed for nausea or vomiting. 20 tablet 8   Semaglutide, 2 MG/DOSE, (OZEMPIC, 2 MG/DOSE,) 8 MG/3ML SOPN Inject 2 mg into the skin once a week. Gets from pt assist 9 mL 3   tiZANidine (ZANAFLEX) 2 MG tablet TAKE 1 TABLET BY MOUTH AT BEDTIME. 90 tablet 1   traZODone (DESYREL) 50 MG tablet TAKE 1/2 TO 1 TABLET BY MOUTH AT BEDTIME AS NEEDED FOR SLEEP 90 tablet 1   vitamin B-12 (CYANOCOBALAMIN) 1000 MCG tablet Take 1,000 mcg by mouth daily.     Vitamin D, Ergocalciferol, (DRISDOL) 1.25 MG (50000 UNIT) CAPS capsule TAKE 1 CAPSULE BY MOUTH ONE TIME PER WEEK 12 capsule 0   No current facility-administered medications on file prior to visit.    Allergies  Allergen Reactions   Vioxx [Rofecoxib] Other (See Comments)    Excess BP which caused TIA    Prednisone Other (See Comments)    Mental status changes with high-dose oral agent. Tolerates epidural steroid injections. No associated rash or fever   Macrodantin [Nitrofurantoin Macrocrystal] Hives   Sulfa Antibiotics     Hives   Colesevelam Other (See Comments)    Leg Pain    Family History  Problem Relation Age of Onset   Colon cancer Maternal Aunt     Diabetes Paternal Uncle    Heart attack Father 63   COPD Mother    Lung cancer Brother        smoker   Mental illness Other        niece, committed suicide.   Heart attack Other        brother X 2; @ 67 & 58   Stroke Neg Hx     BP 140/80    Pulse 94    Ht '5\' 6"'$  (1.676 m)    Wt 146 lb 9.6 oz (66.5 kg)    LMP  (LMP Unknown)    SpO2 97%    BMI 23.66 kg/m    Review of Systems Denies N/V/HB    Objective:   Physical Exam VITAL SIGNS:  See vs page GENERAL: no distress   A1c=7.2%    Assessment & Plan:  Type 2 DM: uncontrolled, due to decreased availability of meds.  Patient Instructions  check your blood sugar twice a day.  vary the time of day when you check, between before the 3 meals, and at bedtime.  also check if you have symptoms of your blood sugar being too high or too low.  please keep a record of the readings and bring it to your next appointment here.  You can write it on any piece of paper.  please call us sooner if your blood sugar goes below 70, or if you have a lot of readings over 200.   Please continue the same Ozempic. In obtaining it, all you can do is your best.  If they offer you a voucher, you should accept that, too.   Please come back for a follow-up appointment in 2 months.

## 2022-02-24 ENCOUNTER — Telehealth: Payer: Self-pay | Admitting: Internal Medicine

## 2022-02-24 ENCOUNTER — Encounter: Payer: Self-pay | Admitting: Internal Medicine

## 2022-02-24 ENCOUNTER — Encounter: Payer: Self-pay | Admitting: Family Medicine

## 2022-02-24 NOTE — Telephone Encounter (Signed)
Patient calling in ? ?Patient says she is scheduled to have surgery at [facility] Thursday 03.16.23 & they are requesting a surgical clearance form be completed by tomorrow 03.15.23.. says they faxed it over to our office but patient does not know what date it was faxed ? ?Patient says she needs the form completed or they are going to postpone her surgery.. requesting cb from nurse [phone] ?

## 2022-02-25 NOTE — Telephone Encounter (Signed)
Replied to pt via her MyChart message ?

## 2022-02-26 DIAGNOSIS — M23304 Other meniscus derangements, unspecified medial meniscus, left knee: Secondary | ICD-10-CM | POA: Diagnosis not present

## 2022-02-26 DIAGNOSIS — S83232A Complex tear of medial meniscus, current injury, left knee, initial encounter: Secondary | ICD-10-CM | POA: Diagnosis not present

## 2022-02-26 DIAGNOSIS — M2242 Chondromalacia patellae, left knee: Secondary | ICD-10-CM | POA: Diagnosis not present

## 2022-02-26 DIAGNOSIS — X58XXXA Exposure to other specified factors, initial encounter: Secondary | ICD-10-CM | POA: Diagnosis not present

## 2022-02-26 DIAGNOSIS — Y999 Unspecified external cause status: Secondary | ICD-10-CM | POA: Diagnosis not present

## 2022-02-26 DIAGNOSIS — G8918 Other acute postprocedural pain: Secondary | ICD-10-CM | POA: Diagnosis not present

## 2022-03-02 ENCOUNTER — Telehealth: Payer: Self-pay

## 2022-03-02 ENCOUNTER — Encounter: Payer: Self-pay | Admitting: Family Medicine

## 2022-03-02 ENCOUNTER — Encounter: Payer: Self-pay | Admitting: Endocrinology

## 2022-03-02 ENCOUNTER — Other Ambulatory Visit: Payer: Self-pay | Admitting: Family Medicine

## 2022-03-02 NOTE — Telephone Encounter (Signed)
Patient now informed that patient assistance has been delivered to the office. Will come by today to pick up. ?

## 2022-03-02 NOTE — Telephone Encounter (Signed)
Patient picked up [place] patient assistance  ?

## 2022-03-04 DIAGNOSIS — Z20822 Contact with and (suspected) exposure to covid-19: Secondary | ICD-10-CM | POA: Diagnosis not present

## 2022-03-09 ENCOUNTER — Other Ambulatory Visit: Payer: Self-pay | Admitting: Endocrinology

## 2022-03-13 DIAGNOSIS — Z7985 Long-term (current) use of injectable non-insulin antidiabetic drugs: Secondary | ICD-10-CM

## 2022-03-13 DIAGNOSIS — I10 Essential (primary) hypertension: Secondary | ICD-10-CM | POA: Diagnosis not present

## 2022-03-13 DIAGNOSIS — E785 Hyperlipidemia, unspecified: Secondary | ICD-10-CM

## 2022-03-13 DIAGNOSIS — E1159 Type 2 diabetes mellitus with other circulatory complications: Secondary | ICD-10-CM | POA: Diagnosis not present

## 2022-03-17 ENCOUNTER — Encounter: Payer: Self-pay | Admitting: Family Medicine

## 2022-03-21 DIAGNOSIS — Z20828 Contact with and (suspected) exposure to other viral communicable diseases: Secondary | ICD-10-CM | POA: Diagnosis not present

## 2022-03-26 ENCOUNTER — Telehealth: Payer: Self-pay

## 2022-03-26 NOTE — Progress Notes (Signed)
? ? ?  Chronic Care Management ?Pharmacy Assistant  ? ?Name: Christine [name]  MRN: [id number] DOB: 11-Jul-1951 ? ? ?Reason for Encounter: Disease State ?  ? ?Recent office visits:  ?None ID ? ?Recent consult visits:  ?02/20/22 Renato Shin, MD-Endocrinology (Diabetes mellitus) ordered POCT Hb A1c, no med changes ? ?Hospital visits:  ?None since last coordination call ? ?Medications: ?Outpatient Encounter Medications as of 03/26/2022  ?Medication Sig Note  ? acetaminophen (TYLENOL) 500 MG tablet Take 500 mg by mouth as needed.   ? aspirin 81 MG tablet Take 81 mg by mouth daily.   ? atorvastatin (LIPITOR) 40 MG tablet TAKE 1 TABLET BY MOUTH EVERY DAY   ? BD PEN NEEDLE NANO 2ND GEN 32G X 4 MM MISC USE ONCE A DAY AS DIRECTED   ? busPIRone (BUSPAR) 15 MG tablet Take 15 mg by mouth 3 (three) times daily.   ? DULoxetine (CYMBALTA) 60 MG capsule Take 60 mg by mouth daily.   ? Lancets (ONETOUCH ULTRASOFT) lancets Check blood sugar once daily. Dx code: 250.00 04/21/2021: use  ? LORazepam (ATIVAN) 0.5 MG tablet Take 0.5 mg by mouth as needed for anxiety.   ? losartan (COZAAR) 50 MG tablet Take 1 tablet (50 mg total) by mouth daily.   ? omeprazole (PRILOSEC) 20 MG capsule TAKE 1 CAPSULE BY MOUTH EVERY DAY   ? ONETOUCH ULTRA test strip USE 1 STRIP TWICE DAILY DX CODE E11.65   ? promethazine (PHENERGAN) 12.5 MG tablet Take 1 tablet (12.5 mg total) by mouth every 8 (eight) hours as needed for nausea or vomiting.   ? Semaglutide, 2 MG/DOSE, (OZEMPIC, 2 MG/DOSE,) 8 MG/3ML SOPN Inject 2 mg into the skin once a week. Gets from pt assist   ? tiZANidine (ZANAFLEX) 2 MG tablet TAKE 1 TABLET BY MOUTH AT BEDTIME.   ? traZODone (DESYREL) 50 MG tablet TAKE 1/2 TO 1 TABLET BY MOUTH AT BEDTIME AS NEEDED FOR SLEEP   ? vitamin B-12 (CYANOCOBALAMIN) 1000 MCG tablet Take 1,000 mcg by mouth daily.   ? Vitamin D, Ergocalciferol, (DRISDOL) 1.25 MG (50000 UNIT) CAPS capsule TAKE 1 CAPSULE BY MOUTH ONE TIME PER WEEK   ? ?No facility-administered  encounter medications on file as of 03/26/2022.  ? ?Reviewed chart for medication changes and drug therapy problems ahead of medication adherence call. ? ?Attempted to contact patient x 3 for medication review and health check, unable to reach patient, left voicemails to return call. ?  ? ?Care Gaps: ?Colonoscopy-11/03/07 ?Diabetic Foot Exam-09/23/21 ?Mammogram-01/27/21 ?Ophthalmology-06/01/14 ?Dexa Scan - NA ?Annual Well Visit - NA ?Micro albumin-NA ?Hemoglobin A1c- 12/18/21 ?  ?Star Rating Drugs: ?Losartan 50 mg-last fill 03/19/22 90 ds ?Atorvastatin 40 mg-last fill 02/03/22 90 ds ?  ?Ethelene Hal ?Clinical Pharmacist Assistant ?[phone]  ?

## 2022-04-01 NOTE — Progress Notes (Signed)
A general assessment call was made to the patient earlier today which was the 3 attempt. Patient then returned call later today and stated that she will be switching physicians, from Dr. Sharlet Salina to Dr. Mitchel Honour. ? ?Patient states that she has been having some issues that were not being addressed. One issue was that her follow up appointment were not being schedule, and other issues that she felt like were not being addressed. She states that it was nothing on Dr. Sharlet Salina part. She also stated that Dr. Mitchel Honour has not quite yet taken her on until June.  ? ?Patient also would like to keep the CCM service. She has a scheduled appt with clinical pharmacist Dan on 08/19/22. ? ?Ethelene Hal ?Clinical Pharmacist Assistant ?[phone]  ?

## 2022-04-07 DIAGNOSIS — Z20822 Contact with and (suspected) exposure to covid-19: Secondary | ICD-10-CM | POA: Diagnosis not present

## 2022-04-13 DIAGNOSIS — Z20822 Contact with and (suspected) exposure to covid-19: Secondary | ICD-10-CM | POA: Diagnosis not present

## 2022-04-14 DIAGNOSIS — F410 Panic disorder [episodic paroxysmal anxiety] without agoraphobia: Secondary | ICD-10-CM | POA: Diagnosis not present

## 2022-04-14 DIAGNOSIS — F339 Major depressive disorder, recurrent, unspecified: Secondary | ICD-10-CM | POA: Diagnosis not present

## 2022-04-22 NOTE — Progress Notes (Signed)
?Charlann Boxer D.O. ?[facility] ?[place] ?Phone: [phone] ?Subjective:   ?I, Christine [name], am serving as a Education administrator for Dr. Hulan Saas. ?This visit occurred during the SARS-CoV-2 public health emergency.  Safety protocols were in place, including screening questions prior to the visit, additional usage of staff PPE, and extensive cleaning of exam room while observing appropriate contact time as indicated for disinfecting solutions.  ? ?I'm seeing this patient by the request  of:  [name], MD ? ?CC: Neck and back pain follow-up ? ?[id number]  ?01/08/2022 ?Patient was also having exacerbation of some of her neck pain.  Given an injection of Toradol today.  Warned of potential side effects.  Avoided anti-inflammatories with patient having the injection today.  Patient is also given an injection of Depo-Medrol but did only 40 mg secondary to patient being off the insulin at this point and only doing the Ozempic.  Total time with patient today answering the questions from her and her husband reviewing patient's MRI greater than 33 minutes ? ?Discussed with patient at great length.  Patient did bring her husband with her as well.  We discussed the findings on the MRI.  Patient does have an oblique tear of the undersurface of the meniscus.  Does appear to even be more displaced we did look on it on the ultrasound.  Patient does have some mild underlying arthritic changes but nothing severe.  Moderate Baker's cyst noted as well.  Discussed with patient that I am concerned with him continuing to have the instability as I do feel that the tear of the medial meniscus body and posterior horn is likely contributing.  At this point I do feel that her best chance of improvement would be surgical intervention.  Patient has been sent and referred appropriately. ?Patient can follow-up with me after surgical intervention if necessary. ? ?Updated 04/23/2022 ?Christine Stutts  [name] is a 71 y.o. female coming in with complaint of knee back and neck pain. Here for manipulation. Tension in shoulders and neck. Knee is doing okay. Went to surgeon yesterday. No new complaints. ? ? ? ?  ? ?Past Medical History:  ?Diagnosis Date  ? Acute bursitis of right shoulder 08/29/2018  ? Right shoulder injected in August 29, 2018  ? Allergic rhinitis   ? Closed displaced fracture of fifth metatarsal bone of left foot 03/16/2017  ? DEPRESSIVE DISORDER NOT ELSEWHERE CLASSIFIED 03/27/2010  ? Qualifier: Diagnosis of  By: [name] MD, [name]   [name], NP , [facility] [place]    ? Diabetes ([place]) 04/02/2016  ? Diabetes mellitus   ? Essential hypertension 03/14/2008  ? Qualifier: Diagnosis of  By: [name] MD, [name]    ? Fibromyalgia 08/12/2011  ? Diagnosed by [name] , Rheumatologist 2010   ? GERD (gastroesophageal reflux disease) 12/25/2016  ? Gluteal tendonitis of right buttock 10/19/2017  ? Injected in October 19, 2017 Injected October 07, 2018  ? Greater trochanteric bursitis of left hip 06/17/2016  ? Greater trochanteric bursitis of right hip 11/27/2015  ? Injected 11/27/2015   ? History of recurrent UTIs   ? [name]  ? HTN (hypertension)   ? Hx of osteopenia 10/11/2012  ? [facility] DEXA; ordered by [name] Lowest T score: -2.1 @ lumbar spine @ [facility] 11/01/12; decrease of 8.5% vs 05/2009. There is 9.2% risk over 10 yrs History fracture left elbow @ 4 Fractured left wrist , right elbow and  nose post fall July/18/2013. Dr. [name] No FH Osteoporosis No PMH of bisphosphonate therapy (generic Fosamax Rxed but not filled  due to polypharmacy )   ? Hyperlipidemia   ? Hyperlipidemia associated with type 2 diabetes mellitus ([place]) 01/18/2007  ? Qualifier: Diagnosis of  By: [name]  With DM LDL goal = < 100, ideally < 70. Mi in father @ 43; 2 brothers @ 77 & 110    ? Lumbar radiculopathy 04/11/2008  ? Qualifier: Diagnosis of  By: [name] MD, Erick Blinks well to epidural 01/12/2017   repeat epidural given May 2018  ? Migraine headache   ? quiescent  ? Nonallopathic lesion of cervical region 07/19/2014  ? Nonallopathic lesion of lumbosacral region 01/15/2016  ? Nonallopathic lesion of sacral region 06/27/2018  ? Nonallopathic lesion-rib cage 09/26/2014  ? Palpitations 04/15/2011  ? Rotator cuff impingement syndrome of left shoulder 05/11/2014  ? Seasonal allergic rhinitis 03/21/2012  ? Sinusitis, chronic 04/20/2016  ? Sleep disorder   ? Dr Dohmier  ? Subluxation of peroneal tendon of right foot 11/10/2017  ? TIA (transient ischemic attack)   ? TRANSIENT ISCHEMIC ATTACKS, HX OF 02/02/2008  ? Qualifier: Diagnosis of  By: [name]    ? UNS ADVRS EFF UNS RX MEDICINAL&BIOLOGICAL [facility] 02/02/2008  ? Qualifier: Diagnosis of  By: [name] MD, [name]    ? UNSPECIFIED MYALGIA AND MYOSITIS 02/02/2008  ? Qualifier: Diagnosis of  By: [name] MD, [name]    ? UTI (urinary tract infection) 10/05/2014  ? Viral upper respiratory tract infection with cough 12/31/2014  ? Vitamin D deficiency 05/25/2008  ? Qualifier: Diagnosis of  By: [name] MD, [name]    ? ?Past Surgical History:  ?Procedure Laterality Date  ? COLONOSCOPY    ? Dr Carlean [name]; hemorrhoids  ? CORONARY ANGIOPLASTY  1997  ? for chest pain- negative   ? POLYPECTOMY  2002  ? benign, hyperplastic polyp; rectal bleeding 2008  ? TONSILLECTOMY    ? TOTAL ABDOMINAL HYSTERECTOMY  1983  ? BSO for endometriosis  ? WISDOM TOOTH EXTRACTION    ? ?Social History  ? ?Socioeconomic History  ? Marital status: Married  ?  Spouse name: Not on file  ? Number of children: Not on file  ? Years of education: Not on file  ? Highest education level: Not on file  ?Occupational History  ? Occupation: Research scientist (physical sciences)   ?  Employer: [employer]  ?Tobacco Use  ? Smoking status: Never  ? Smokeless tobacco: Never  ?Vaping Use  ? Vaping Use: Never used  ?Substance and Sexual Activity  ? Alcohol use: No  ? Drug use: No  ? Sexual activity: Not on file  ?Other Topics Concern  ?  Not on file  ?Social History Narrative  ? Regular exercise- no   ? ?Social Determinants of Health  ? ?Financial Resource Strain: Low Risk   ? Difficulty of Paying Living Expenses: Not hard at all  ?Food Insecurity: No Food Insecurity  ? Worried About Charity fundraiser in the Last Year: Never true  ? Ran Out of Food in the Last Year: Never true  ?Transportation Needs: No Transportation Needs  ? Lack of Transportation (Medical): No  ? Lack of Transportation (Non-Medical): No  ?Physical Activity: Inactive  ? Days of Exercise per Week: 0 days  ? Minutes of Exercise per Session: 0 min  ?Stress: No Stress Concern Present  ? Feeling of Stress : Not at all  ?Social Connections: Socially Integrated  ?  Frequency of Communication with Friends and Family: More than three times a week  ? Frequency of Social Gatherings with Friends and Family: More than three times a week  ? Attends Religious Services: More than 4 times per year  ? Active Member of Clubs or Organizations: Yes  ? Attends Archivist Meetings: More than 4 times per year  ? Marital Status: Married  ? ?Allergies  ?Allergen Reactions  ? Vioxx [Rofecoxib] Other (See Comments)  ?  Excess BP which caused TIA ?  ? Prednisone Other (See Comments)  ?  Mental status changes with high-dose oral agent. Tolerates epidural steroid injections. ?No associated rash or fever  ? Macrodantin [Nitrofurantoin Macrocrystal] Hives  ? Sulfa Antibiotics   ?  Hives  ? Colesevelam Other (See Comments)  ?  Leg Pain  ? ?Family History  ?Problem Relation Age of Onset  ? Colon cancer Maternal Aunt   ? Diabetes Paternal Uncle   ? Heart attack Father 39  ? COPD Mother   ? Lung cancer Brother   ?     smoker  ? Mental illness Other   ?     niece, committed suicide.  ? Heart attack Other   ?     brother X 2; @ 71 & 17  ? Stroke Neg Hx   ? ? ?Current Outpatient Medications (Endocrine & Metabolic):  ?  Semaglutide, 2 MG/DOSE, (OZEMPIC, 2 MG/DOSE,) 8 MG/3ML SOPN, Inject 2 mg into the skin once  a week. Gets from pt assist ? ?Current Outpatient Medications (Cardiovascular):  ?  atorvastatin (LIPITOR) 40 MG tablet, TAKE 1 TABLET BY MOUTH EVERY DAY ?  losartan (COZAAR) 50 MG tablet, Take 1 tablet (5

## 2022-04-23 ENCOUNTER — Ambulatory Visit (INDEPENDENT_AMBULATORY_CARE_PROVIDER_SITE_OTHER): Payer: Medicare Other | Admitting: Family Medicine

## 2022-04-23 VITALS — BP 124/80 | HR 83 | Ht 66.0 in | Wt 144.0 lb

## 2022-04-23 DIAGNOSIS — M5010 Cervical disc disorder with radiculopathy, unspecified cervical region: Secondary | ICD-10-CM | POA: Diagnosis not present

## 2022-04-23 DIAGNOSIS — M9901 Segmental and somatic dysfunction of cervical region: Secondary | ICD-10-CM | POA: Diagnosis not present

## 2022-04-23 DIAGNOSIS — M797 Fibromyalgia: Secondary | ICD-10-CM

## 2022-04-23 DIAGNOSIS — M9903 Segmental and somatic dysfunction of lumbar region: Secondary | ICD-10-CM

## 2022-04-23 DIAGNOSIS — M9902 Segmental and somatic dysfunction of thoracic region: Secondary | ICD-10-CM

## 2022-04-23 DIAGNOSIS — M9908 Segmental and somatic dysfunction of rib cage: Secondary | ICD-10-CM

## 2022-04-23 DIAGNOSIS — M9904 Segmental and somatic dysfunction of sacral region: Secondary | ICD-10-CM | POA: Diagnosis not present

## 2022-04-23 NOTE — Patient Instructions (Signed)
Good to see you! ?Keep taking care of yourself as well ?See you again in 6-8 weeks ?

## 2022-04-24 ENCOUNTER — Ambulatory Visit: Payer: Medicare Other | Admitting: Endocrinology

## 2022-04-24 NOTE — Assessment & Plan Note (Signed)
Chronic problem with mild exacerbation. ?Patient is doing relatively well with the duloxetine and Zanaflex.  Patient though has had a lot more stress recently secondary to her husband will be more ill.  Patient is going to try to find more time for herself.  Increase activity.  Follow-up again in 6 to 8 weeks ?

## 2022-04-27 ENCOUNTER — Telehealth: Payer: Self-pay

## 2022-04-27 ENCOUNTER — Encounter: Payer: Self-pay | Admitting: Internal Medicine

## 2022-04-27 NOTE — Telephone Encounter (Signed)
I am taking in way too many daily new patients.  I will not be accepting this transfer of care.  Do not schedule this patient with me.  Thanks.

## 2022-04-27 NOTE — Telephone Encounter (Signed)
Pt called in and is requesting to transfer care from current PCP to Dr. Mitchel Honour.  ? ?Pt was informed that we must get okay from both providers before this transition can occur. States "I never want to see Dr. Sharlet Salina again. My husband has cancer and she has not done anything."  ? ?Offered sympathy to patient and discussed TOC process with pt. She states she needs to see someone because her "diabetes is all over the place and she does not know what to do."  ? ?Pt began to cry over the phone and I calmed her to the best of my ability. Scheduled an appt with Dr. Quay Burow tomorrow, and told her someone will reach out to her once we receive the okay.  ?

## 2022-04-27 NOTE — Progress Notes (Signed)
? ? ?Subjective:  ? ? Patient ID: Christine [name], female    DOB: Sep 03, 1951, 71 y.o.   MRN: [id number] ? ? ? ? ? ?HPI ?Nuha is here for  ?Chief Complaint  ?Patient presents with  ? Diabetes  ?  Dr. Loanne Drilling left a week early.  ? ? ? ?Diabetes - sugars have been higher recently.  She has had increased stress recently.  Her husband was recently diagnosed with bladder cancer and has started treatment and has had some complications.  She is checking her sugars regularly.  Sugars in am usually 130-150.  This is higher than usual for her.  She does not always check it later in the day.  She has not been eating on time and planning meals.  She is eating more carbohydrates.  Since her sugars are higher she wanted to make sure that they were not too high and getting out of control. ? ?She was supposed to see Dr. Loanne Drilling today, but he has already left the practice. ? ? ?Taking ozempic 2 mg weekly and she doing well with it except some belching.   ? ? ?Lab Results  ?Component Value Date  ? HGBA1C 7.2 (A) 02/20/2022  ? ? ? ?Medications and allergies reviewed with patient and updated if appropriate. ? ?Current Outpatient Medications on File Prior to Visit  ?Medication Sig Dispense Refill  ? acetaminophen (TYLENOL) 500 MG tablet Take 500 mg by mouth as needed.    ? aspirin 81 MG tablet Take 81 mg by mouth daily.    ? atorvastatin (LIPITOR) 40 MG tablet TAKE 1 TABLET BY MOUTH EVERY DAY 90 tablet 0  ? busPIRone (BUSPAR) 15 MG tablet Take 15 mg by mouth 3 (three) times daily.    ? DULoxetine (CYMBALTA) 60 MG capsule Take 60 mg by mouth daily.    ? LORazepam (ATIVAN) 0.5 MG tablet Take 0.5 mg by mouth as needed for anxiety.    ? losartan (COZAAR) 50 MG tablet Take 1 tablet (50 mg total) by mouth daily. 90 tablet 3  ? meloxicam (MOBIC) 15 MG tablet Take 15 mg by mouth daily.    ? omeprazole (PRILOSEC) 20 MG capsule TAKE 1 CAPSULE BY MOUTH EVERY DAY 90 capsule 2  ? promethazine (PHENERGAN) 12.5 MG tablet Take 1 tablet (12.5 mg  total) by mouth every 8 (eight) hours as needed for nausea or vomiting. 20 tablet 8  ? Semaglutide, 2 MG/DOSE, (OZEMPIC, 2 MG/DOSE,) 8 MG/3ML SOPN Inject 2 mg into the skin once a week. Gets from pt assist 9 mL 3  ? tiZANidine (ZANAFLEX) 2 MG tablet TAKE 1 TABLET BY MOUTH AT BEDTIME. 90 tablet 1  ? traZODone (DESYREL) 50 MG tablet TAKE 1/2 TO 1 TABLET BY MOUTH AT BEDTIME AS NEEDED FOR SLEEP 90 tablet 1  ? vitamin B-12 (CYANOCOBALAMIN) 1000 MCG tablet Take 1,000 mcg by mouth daily.    ? Vitamin D, Ergocalciferol, (DRISDOL) 1.25 MG (50000 UNIT) CAPS capsule TAKE 1 CAPSULE BY MOUTH ONE TIME PER WEEK 12 capsule 0  ? BD PEN NEEDLE NANO 2ND GEN 32G X 4 MM MISC USE ONCE A DAY AS DIRECTED (Patient not taking: Reported on 04/28/2022) 100 each 3  ? Lancets (ONETOUCH ULTRASOFT) lancets Check blood sugar once daily. Dx code: 22.00 (Patient not taking: Reported on 04/28/2022) 100 each 12  ? ONETOUCH ULTRA test strip USE 1 STRIP TWICE DAILY DX CODE E11.65 (Patient not taking: Reported on 04/28/2022) 100 strip 3  ? ?No current facility-administered medications  on file prior to visit.  ? ? ?Review of Systems  ?Respiratory:  Negative for shortness of breath.   ?Cardiovascular:  Positive for palpitations (occ). Negative for chest pain.  ?Gastrointestinal:  Positive for nausea (mild with ozempic). Negative for constipation.  ?Neurological:  Negative for light-headedness and headaches.  ? ?   ?Objective:  ? ?Vitals:  ? 04/28/22 1400  ?BP: 132/72  ?Pulse: 87  ?Temp: 98 ?F (36.7 ?C)  ?SpO2: 97%  ? ?BP Readings from Last 3 Encounters:  ?04/28/22 132/72  ?04/23/22 124/80  ?02/20/22 140/80  ? ?Wt Readings from Last 3 Encounters:  ?04/28/22 139 lb (63 kg)  ?04/23/22 144 lb (65.3 kg)  ?02/20/22 146 lb 9.6 oz (66.5 kg)  ? ?Body mass index is 22.44 kg/m?. ? ?  ?Physical Exam ?Constitutional:   ?   General: She is not in acute distress. ?   Appearance: Normal appearance.  ?HENT:  ?   Head: Normocephalic.  ?Skin: ?   General: Skin is warm and dry.   ?Neurological:  ?   Mental Status: She is alert.  ?Psychiatric:     ?   Mood and Affect: Mood normal.  ? ?   ? ? ? ? ? ?Assessment & Plan:  ? ? ?See Problem List for Assessment and Plan of chronic medical problems.  ? ? ? ? ?

## 2022-04-27 NOTE — Telephone Encounter (Signed)
Spoke with patient recommending she follow up with PCP.  Patient is very upset and does not feel her PCP will be able to manage her ozempic as she is already not feeling it is working correctly.  Please advise. ?

## 2022-04-27 NOTE — Telephone Encounter (Signed)
Ok with me 

## 2022-04-28 ENCOUNTER — Telehealth: Payer: Self-pay

## 2022-04-28 ENCOUNTER — Ambulatory Visit: Payer: Medicare Other | Admitting: Endocrinology

## 2022-04-28 ENCOUNTER — Ambulatory Visit (INDEPENDENT_AMBULATORY_CARE_PROVIDER_SITE_OTHER): Payer: Medicare Other | Admitting: Internal Medicine

## 2022-04-28 VITALS — BP 132/72 | HR 87 | Temp 98.0°F | Ht 66.0 in | Wt 139.0 lb

## 2022-04-28 DIAGNOSIS — E119 Type 2 diabetes mellitus without complications: Secondary | ICD-10-CM

## 2022-04-28 LAB — POCT GLYCOSYLATED HEMOGLOBIN (HGB A1C)
HbA1c POC (<> result, manual entry): 7.4 % (ref 4.0–5.6)
HbA1c, POC (controlled diabetic range): 7.4 % — AB (ref 0.0–7.0)
HbA1c, POC (prediabetic range): 7.4 % — AB (ref 5.7–6.4)
Hemoglobin A1C: 7.4 % — AB (ref 4.0–5.6)

## 2022-04-28 NOTE — Telephone Encounter (Signed)
Do not schedule this patient with me.  I will not be accepting this transfer of care.  Thanks.

## 2022-04-28 NOTE — Patient Instructions (Addendum)
? ? ?  Your a1c today was 7.4% ? ? ? ?Medications changes include :   none ? ? ?

## 2022-04-28 NOTE — Assessment & Plan Note (Signed)
Chronic ?Was following with Dr. Loanne Drilling, but will need to return care to primary care physician since he is no longer at the practice ?Sugars have been controlled overall, but recently with increased stress and not eating as well sugars at home have been higher and she is concerned that her diabetes may be getting a controlled ?A1c today here in the office 7.4%-slightly higher than usual, but we are both comfortable with this as long as it does not go higher ?Continue Ozempic 2 mg weekly ?She should be at home now more with where her husband is in his treatment so she can eat at home and do more meal planning and thinks the sugars will improve ?Discussed that if she continues to lose weight with the Ozempic we may need to decrease the dosage and add a second medication ?Advised follow-up with PCP in 2-3 months to reevaluate, sooner if needed ?

## 2022-04-29 NOTE — Telephone Encounter (Signed)
Message left for patient today that Dr. Kittie Plater was not accepting new patients. ?

## 2022-05-03 ENCOUNTER — Other Ambulatory Visit: Payer: Self-pay | Admitting: Internal Medicine

## 2022-05-24 ENCOUNTER — Other Ambulatory Visit: Payer: Self-pay | Admitting: Family Medicine

## 2022-06-01 ENCOUNTER — Telehealth: Payer: Self-pay

## 2022-06-01 DIAGNOSIS — M25562 Pain in left knee: Secondary | ICD-10-CM | POA: Diagnosis not present

## 2022-06-01 NOTE — Progress Notes (Unsigned)
Christine [name] [address] [place] [facility] Phone: [phone] Subjective:   [name], am serving as a scribe for Dr. Hulan Saas.  I'm seeing this patient by the request  of:  Christine Koch, MD  CC: back and neck pain follow up   [id number]  Christine [name] is a 71 y.o. female coming in with complaint of back and neck pain. OMT 04/23/2022. Patient states still having back trouble with activity.  Patient was given an injection in the left knee and states since then she has had difficulty with her blood sugars being over 250.  Patient unfortunately no longer has an endocrinologist.  Festus Barren if she needs another one.  The patient was taken off her insulin recently because she was doing so well with the weight loss on Ozempic.  Medications patient has been prescribed: Vit D  Taking: Yes         Reviewed prior external information including notes and imaging from previsou exam, outside providers and external EMR if available.   As well as notes that were available from care everywhere and other healthcare systems.  Past medical history, social, surgical and family history all reviewed in electronic medical record.  No pertanent information unless stated regarding to the chief complaint.   Past Medical History:  Diagnosis Date   Acute bursitis of right shoulder 08/29/2018   Right shoulder injected in August 29, 2018   Allergic rhinitis    Closed displaced fracture of fifth metatarsal bone of left foot 03/16/2017   DEPRESSIVE DISORDER NOT ELSEWHERE CLASSIFIED 03/27/2010   Qualifier: Diagnosis of  By: [name] MD, [name]   [name], NP , [facility] [place]     Diabetes ([place]) 04/02/2016   Diabetes mellitus    Essential hypertension 03/14/2008   Qualifier: Diagnosis of  By: [name] MD, [name]     Fibromyalgia 08/12/2011   Diagnosed by [name] , Rheumatologist 2010    GERD (gastroesophageal reflux  disease) 12/25/2016   Gluteal tendonitis of right buttock 10/19/2017   Injected in October 19, 2017 Injected October 07, 2018   Greater trochanteric bursitis of left hip 06/17/2016   Greater trochanteric bursitis of right hip 11/27/2015   Injected 11/27/2015    History of recurrent UTIs    [name]   HTN (hypertension)    Hx of osteopenia 10/11/2012   [facility]EXA; ordered by [name] Lowest T score: -2.1 @ lumbar spine @ [facility] 11/01/12; decrease of 8.5% vs 05/2009. There is 9.2% risk over 10 yrs History fracture left elbow @ 4 Fractured left wrist , right elbow and nose post fall July/18/2013. Dr. [name] No FH Osteoporosis No PMH of bisphosphonate therapy (generic Fosamax Rxed but not filled  due to polypharmacy )    Hyperlipidemia    Hyperlipidemia associated with type 2 diabetes mellitus ([place]) 01/18/2007   Qualifier: Diagnosis of  By: [name]  With DM LDL goal = < 100, ideally < 70. Mi in father @ 40; 2 brothers @ 2 & 53     Lumbar radiculopathy 04/11/2008   Qualifier: Diagnosis of  By: [name] MD, Erick Blinks well to epidural 01/12/2017  repeat epidural given May 2018   Migraine headache    quiescent   Nonallopathic lesion of cervical region 07/19/2014   Nonallopathic lesion of lumbosacral region 01/15/2016   Nonallopathic lesion of sacral region 06/27/2018   Nonallopathic lesion-rib cage 09/26/2014   Palpitations 04/15/2011  Rotator cuff impingement syndrome of left shoulder 05/11/2014   Seasonal allergic rhinitis 03/21/2012   Sinusitis, chronic 04/20/2016   Sleep disorder    Dr Dohmier   Subluxation of peroneal tendon of right foot 11/10/2017   TIA (transient ischemic attack)    [place], HX OF 02/02/2008   Qualifier: Diagnosis of  By: Donalee Citrin ADVRS EFF UNS RX MEDICINAL&BIOLOGICAL [place] 02/02/2008   Qualifier: Diagnosis of  By: [name] MD, Posey Pronto AND MYOSITIS 02/02/2008   Qualifier: Diagnosis of  By: [name] MD,  [name]     UTI (urinary tract infection) 10/05/2014   Viral upper respiratory tract infection with cough 12/31/2014   Vitamin D deficiency 05/25/2008   Qualifier: Diagnosis of  By: [name] MD, [name]      Allergies  Allergen Reactions   Vioxx [Rofecoxib] Other (See Comments)    Excess BP which caused TIA    Prednisone Other (See Comments)    Mental status changes with high-dose oral agent. Tolerates epidural steroid injections. No associated rash or fever   Macrodantin [Nitrofurantoin Macrocrystal] Hives   Sulfa Antibiotics     Hives   Colesevelam Other (See Comments)    Leg Pain     Review of Systems:  No headache, visual changes, nausea, vomiting, diarrhea, constipation, dizziness, abdominal pain, skin rash, fevers, chills, night sweats, weight loss, swollen lymph nodes, body aches, joint swelling, chest pain, shortness of breath, mood changes. POSITIVE muscle aches  Objective  Blood pressure 124/78, pulse 78, height '5\' 6"'$  (1.676 m), weight 143 lb (64.9 kg), SpO2 96 %.   General: No apparent distress alert and oriented x3 mood and affect normal, dressed appropriately.  HEENT: Pupils equal, extraocular movements intact  Respiratory: Patient's speak in full sentences and does not appear short of breath  Cardiovascular: No lower extremity edema, non tender, no erythema  Gait MSK:  Back does have some loss of lordosis.  Tightness noted more in the thoracolumbar juncture.  Does have tightness noted in the cervical thoracic area as well.  No tenderness to palpation of the paraspinal musculature otherwise.  Osteopathic findings  C2 flexed rotated and side bent right C6 flexed rotated and side bent left T3 extended rotated and side bent right inhaled rib T9 extended rotated and side bent left L2 flexed rotated and side bent right Sacrum right on right       Assessment and Plan:  Cervical disc disorder with radiculopathy of cervical region More increased tightness, at this  point.  Discussed with Exercises.  He does have the meloxicam.  Patient does have some increasing in stress at the moment.  Also having some increasing elevation in her blood sugars especially after the steroid injection in the knee.  We discussed home exercises and icing regimen.  We will follow-up with me again in 6 to 8 weeks otherwise.  Diabetes mellitus type 2 in nonobese [facility]) Having more difficulty with her A1c and blood sugars.  Patient will continue to monitor.  We will send note to primary care physician.  Having the patient is no longer seeing a endocrinologist.  We will refer patient because I do feel like it could be been beneficial for her.  With me again 6 to 8 weeks for everything else.    Nonallopathic problems  Decision today to treat with OMT was based on Physical Exam  After verbal consent patient was treated with HVLA, ME, FPR techniques in cervical,  rib, thoracic, lumbar, and sacral  areas  Patient tolerated the procedure well with improvement in symptoms  Patient given exercises, stretches and lifestyle modifications  See medications in patient instructions if given  Patient will follow up in 6-8 weeks     The above documentation has been reviewed and is accurate and complete Lyndal Pulley, DO         Note: This dictation was prepared with Dragon dictation along with smaller phrase technology. Any transcriptional errors that result from this process are unintentional.

## 2022-06-01 NOTE — Telephone Encounter (Signed)
Called patient to let her know she has 4 boxes of ozempic ready for pickup.

## 2022-06-02 ENCOUNTER — Ambulatory Visit (INDEPENDENT_AMBULATORY_CARE_PROVIDER_SITE_OTHER): Payer: Medicare Other | Admitting: Family Medicine

## 2022-06-02 VITALS — BP 124/78 | HR 78 | Ht 66.0 in | Wt 143.0 lb

## 2022-06-02 DIAGNOSIS — M9903 Segmental and somatic dysfunction of lumbar region: Secondary | ICD-10-CM | POA: Diagnosis not present

## 2022-06-02 DIAGNOSIS — M9908 Segmental and somatic dysfunction of rib cage: Secondary | ICD-10-CM | POA: Diagnosis not present

## 2022-06-02 DIAGNOSIS — E1151 Type 2 diabetes mellitus with diabetic peripheral angiopathy without gangrene: Secondary | ICD-10-CM | POA: Diagnosis not present

## 2022-06-02 DIAGNOSIS — M5010 Cervical disc disorder with radiculopathy, unspecified cervical region: Secondary | ICD-10-CM | POA: Diagnosis not present

## 2022-06-02 DIAGNOSIS — M9902 Segmental and somatic dysfunction of thoracic region: Secondary | ICD-10-CM

## 2022-06-02 DIAGNOSIS — Z794 Long term (current) use of insulin: Secondary | ICD-10-CM

## 2022-06-02 DIAGNOSIS — M9901 Segmental and somatic dysfunction of cervical region: Secondary | ICD-10-CM | POA: Diagnosis not present

## 2022-06-02 DIAGNOSIS — E119 Type 2 diabetes mellitus without complications: Secondary | ICD-10-CM | POA: Diagnosis not present

## 2022-06-02 DIAGNOSIS — M9904 Segmental and somatic dysfunction of sacral region: Secondary | ICD-10-CM | POA: Diagnosis not present

## 2022-06-02 NOTE — Assessment & Plan Note (Signed)
More increased tightness, at this point.  Discussed with Exercises.  He does have the meloxicam.  Patient does have some increasing in stress at the moment.  Also having some increasing elevation in her blood sugars especially after the steroid injection in the knee.  We discussed home exercises and icing regimen.  We will follow-up with me again in 6 to 8 weeks otherwise.

## 2022-06-02 NOTE — Patient Instructions (Signed)
I'll figure out PCP situation Ref to Endocrinology Please watch blood sugars See you again in 6-8 weeks

## 2022-06-02 NOTE — Assessment & Plan Note (Signed)
Having more difficulty with her A1c and blood sugars.  Patient will continue to monitor.  We will send note to primary care physician.  Having the patient is no longer seeing a endocrinologist.  We will refer patient because I do feel like it could be been beneficial for her.  With me again 6 to 8 weeks for everything else.

## 2022-06-02 NOTE — Telephone Encounter (Signed)
Patient picked up 4 boxes of Ozempic - noted log

## 2022-06-03 DIAGNOSIS — F410 Panic disorder [episodic paroxysmal anxiety] without agoraphobia: Secondary | ICD-10-CM | POA: Diagnosis not present

## 2022-06-03 DIAGNOSIS — F339 Major depressive disorder, recurrent, unspecified: Secondary | ICD-10-CM | POA: Diagnosis not present

## 2022-06-04 ENCOUNTER — Telehealth: Payer: Self-pay | Admitting: Internal Medicine

## 2022-06-04 NOTE — Telephone Encounter (Signed)
Left message for patient to call back to schedule Medicare Annual Wellness Visit   Last AWV  06/04/21  Please schedule at anytime with [facility] if patient calls the office back.      Any questions, please call me at [phone]

## 2022-06-09 ENCOUNTER — Ambulatory Visit: Payer: Medicare Other | Admitting: Family Medicine

## 2022-06-10 ENCOUNTER — Other Ambulatory Visit: Payer: Self-pay | Admitting: Internal Medicine

## 2022-06-15 ENCOUNTER — Telehealth: Payer: Self-pay | Admitting: Internal Medicine

## 2022-06-15 NOTE — Telephone Encounter (Signed)
Okay to switch?  

## 2022-06-15 NOTE — Telephone Encounter (Signed)
Pt called stating that her and her husband want switch to Dr. Esther Hardy. Pt states that her and her husband were patients of Dr.Kulik many years before Dr.Kulik moved. The best contact number is [phone].

## 2022-06-17 NOTE — Telephone Encounter (Signed)
Left message for patient to callback to clarify what dosage of Losartan she has been taking.  Original Rx is for Losartan '100mg'$  QD. Per last OV with Dr. Tamala Julian on 09/29/21 patient to decrease Losartan to '50mg'$  QD.  Provided office number for callback.

## 2022-07-01 ENCOUNTER — Encounter: Payer: Self-pay | Admitting: Family Medicine

## 2022-07-01 ENCOUNTER — Ambulatory Visit (INDEPENDENT_AMBULATORY_CARE_PROVIDER_SITE_OTHER): Payer: Medicare Other | Admitting: Family Medicine

## 2022-07-01 VITALS — BP 118/70 | HR 87 | Temp 97.9°F | Ht 66.0 in | Wt 141.4 lb

## 2022-07-01 DIAGNOSIS — R000 Tachycardia, unspecified: Secondary | ICD-10-CM

## 2022-07-01 DIAGNOSIS — E119 Type 2 diabetes mellitus without complications: Secondary | ICD-10-CM | POA: Diagnosis not present

## 2022-07-01 DIAGNOSIS — R42 Dizziness and giddiness: Secondary | ICD-10-CM | POA: Diagnosis not present

## 2022-07-01 MED ORDER — CEPHALEXIN 250 MG PO CAPS: 250.0000 mg | ORAL_CAPSULE | Freq: Every day | ORAL | 3 refills | Status: DC

## 2022-07-01 MED ORDER — ATORVASTATIN CALCIUM 40 MG PO TABS: 40.0000 mg | ORAL_TABLET | Freq: Every day | ORAL | 3 refills | Status: DC

## 2022-07-01 MED ORDER — OMEPRAZOLE 20 MG PO CPDR: DELAYED_RELEASE_CAPSULE | ORAL | 3 refills | Status: DC

## 2022-07-01 NOTE — Patient Instructions (Signed)

## 2022-07-01 NOTE — Progress Notes (Signed)
Subjective:     Patient ID: Christine [name], female    DOB: April 01, 1951, 71 y.o.   MRN: [id number]  Chief Complaint  Patient presents with   Transfer of Care    Need pcp to manage diabetes due to previous endo provider leaving Not fasting      HPI TOC.  I saw her a long time ago.   Husb dx bladder ca.  Pt had urosepsis last Sept.  Seeing urol  DM-was seeing Dr. Loanne Drilling.  Was on insulin but then on ozempic and did well so off insulin.  Got injection in knee-6/19 and sugar went up to 270.  Sugar 105 this am. Only checks in am.  Since 7/1-highest 138.  Was on Basalgar in past.   No lows.   Dizziness freq-when gets up-did event monitor and echo.  Meclizine not work.  Drinks some water but a lot of diet coke.   Health Maintenance Due  Topic Date Due   Zoster Vaccines- Shingrix (1 of 2) Never done   COLONOSCOPY (Pts 45-27yr Insurance coverage will need to be confirmed)  11/02/2017   OPHTHALMOLOGY EXAM  05/19/2019   TETANUS/TDAP  06/08/2019   MAMMOGRAM  05/21/2021    Past Medical History:  Diagnosis Date   Acute bursitis of right shoulder 08/29/2018   Right shoulder injected in August 29, 2018   Allergic rhinitis    Closed displaced fracture of fifth metatarsal bone of left foot 03/16/2017   DEPRESSIVE DISORDER NOT ELSEWHERE CLASSIFIED 03/27/2010   Qualifier: Diagnosis of  By: HLinna DarnerMD, WGwyndolyn Saxon  Mr BGalen Manila NP , [facility] [place]     Diabetes [place] 04/02/2016   Diabetes mellitus    Essential hypertension 03/14/2008   Qualifier: Diagnosis of  By: HLinna DarnerMD, [name]     Fibromyalgia 08/12/2011   Diagnosed by Dr ZMarveen Reeks, Rheumatologist 2010    GERD (gastroesophageal reflux disease) 12/25/2016   Gluteal tendonitis of right buttock 10/19/2017   Injected in October 19, 2017 Injected October 07, 2018   Greater trochanteric bursitis of left hip 06/17/2016   Greater trochanteric bursitis of right hip 11/27/2015   Injected 11/27/2015    History of recurrent  UTIs    [name]   HTN (hypertension)    Hx of osteopenia 10/11/2012   [facility]; ordered by Dr HLinna DarnerLowest T score: -2.1 @ lumbar spine @ [facility] 11/01/12; decrease of 8.5% vs 05/2009. There is 9.2% risk over 10 yrs History fracture left elbow @ 6 Fractured left wrist , right elbow and nose post fall July/18/2013. Dr. OCaralyn GuileNo FH Osteoporosis No PMH of bisphosphonate therapy (generic Fosamax Rxed but not filled  due to polypharmacy )    Hyperlipidemia    Hyperlipidemia associated with type 2 diabetes mellitus ([place] 01/18/2007   Qualifier: Diagnosis of  By: DAllen Norris With DM LDL goal = < 100, ideally < 70. Mi in father @ 532 2 brothers @ 532& 538    Lumbar radiculopathy 04/11/2008   Qualifier: Diagnosis of  By: HLinna DarnerMD, WErick Blinkswell to epidural 01/12/2017  repeat epidural given May 2018   Migraine headache    quiescent   Nonallopathic lesion of cervical region 07/19/2014   Nonallopathic lesion of lumbosacral region 01/15/2016   Nonallopathic lesion of sacral region 06/27/2018   Nonallopathic lesion-rib cage 09/26/2014   Palpitations 04/15/2011   Rotator cuff impingement syndrome of left shoulder 05/11/2014   Seasonal allergic rhinitis 03/21/2012  Sinusitis, chronic 04/20/2016   Sleep disorder    Dr Dohmier   Subluxation of peroneal tendon of right foot 11/10/2017   TIA (transient ischemic attack)    [place], HX OF 02/02/2008   Qualifier: Diagnosis of  By: Donalee Citrin ADVRS EFF UNS RX MEDICINAL&BIOLOGICAL [place] 02/02/2008   Qualifier: Diagnosis of  By: [name] MD, Posey Pronto AND MYOSITIS 02/02/2008   Qualifier: Diagnosis of  By: [name] MD, [name]     UTI (urinary tract infection) 10/05/2014   Viral upper respiratory tract infection with cough 12/31/2014   Vitamin D deficiency 05/25/2008   Qualifier: Diagnosis of  By: [name] MD, [name]      Past Surgical History:  Procedure Laterality Date   COLONOSCOPY     Dr  Carlean [name]; hemorrhoids   CORONARY ANGIOPLASTY  12/15/1995   for chest pain- negative    KNEE ARTHROSCOPY Left 2023   POLYPECTOMY  12/14/2000   benign, hyperplastic polyp; rectal bleeding 2008   TONSILLECTOMY     TOTAL ABDOMINAL HYSTERECTOMY  12/14/1981   BSO for endometriosis   WISDOM TOOTH EXTRACTION      Outpatient Medications Prior to Visit  Medication Sig Dispense Refill   acetaminophen (TYLENOL) 500 MG tablet Take 500 mg by mouth as needed.     aspirin 81 MG tablet Take 81 mg by mouth daily.     BD PEN NEEDLE NANO 2ND GEN 32G X 4 MM MISC USE ONCE A DAY AS DIRECTED 100 each 3   busPIRone (BUSPAR) 15 MG tablet Take 15 mg by mouth 3 (three) times daily.     doxepin (SINEQUAN) 10 MG/ML solution SMARTSIG:0.6 Milliliter(s) By Mouth Every Night     DULoxetine (CYMBALTA) 60 MG capsule Take 60 mg by mouth daily.     Lancets (ONETOUCH ULTRASOFT) lancets Check blood sugar once daily. Dx code: 250.00 100 each 12   LORazepam (ATIVAN) 0.5 MG tablet Take 0.5 mg by mouth as needed for anxiety.     losartan (COZAAR) 50 MG tablet Take 1 tablet (50 mg total) by mouth daily. 90 tablet 3   ONETOUCH ULTRA test strip USE 1 STRIP TWICE DAILY DX CODE E11.65 100 strip 3   promethazine (PHENERGAN) 12.5 MG tablet Take 1 tablet (12.5 mg total) by mouth every 8 (eight) hours as needed for nausea or vomiting. 20 tablet 8   Semaglutide, 2 MG/DOSE, (OZEMPIC, 2 MG/DOSE,) 8 MG/3ML SOPN Inject 2 mg into the skin once a week. Gets from pt assist 9 mL 3   tiZANidine (ZANAFLEX) 2 MG tablet TAKE 1 TABLET BY MOUTH AT BEDTIME. 90 tablet 1   traZODone (DESYREL) 50 MG tablet TAKE 1/2 TO 1 TABLET BY MOUTH AT BEDTIME AS NEEDED FOR SLEEP 90 tablet 1   vitamin B-12 (CYANOCOBALAMIN) 1000 MCG tablet Take 1,000 mcg by mouth daily.     Vitamin D, Ergocalciferol, (DRISDOL) 1.25 MG (50000 UNIT) CAPS capsule TAKE 1 CAPSULE BY MOUTH ONE TIME PER WEEK 12 capsule 0   atorvastatin (LIPITOR) 40 MG tablet TAKE 1 TABLET BY MOUTH EVERY DAY 90  tablet 0   cephALEXin (KEFLEX) 250 MG capsule Take 250 mg by mouth daily.     omeprazole (PRILOSEC) 20 MG capsule TAKE 1 CAPSULE BY MOUTH EVERY DAY 90 capsule 2   meloxicam (MOBIC) 15 MG tablet Take 15 mg by mouth daily.     No facility-administered medications prior to visit.    Allergies  Allergen  Reactions   Vioxx [Rofecoxib] Other (See Comments)    Excess BP which caused TIA    Prednisone Other (See Comments)    Mental status changes with high-dose oral agent. Tolerates epidural steroid injections. No associated rash or fever   Macrodantin [Nitrofurantoin Macrocrystal] Hives   Sulfa Antibiotics     Hives   Colesevelam Other (See Comments)    Leg Pain   ROS neg/noncontributory except as noted HPI/below      Objective:     BP 118/70   Pulse 87   Temp 97.9 F (36.6 C) (Temporal)   Ht '5\' 6"'$  (1.676 m)   Wt 141 lb 6 oz (64.1 kg)   LMP  (LMP Unknown)   SpO2 96%   BMI 22.82 kg/m  Wt Readings from Last 3 Encounters:  07/01/22 141 lb 6 oz (64.1 kg)  06/02/22 143 lb (64.9 kg)  04/28/22 139 lb (63 kg)    Physical Exam   Gen: WDWN NAD.  Not orthostatic, but tachy w/standing HEENT: NCAT, conjunctiva not injected, sclera nonicteric NECK:  supple, no thyromegaly, no nodes, no carotid bruits CARDIAC: RRR, S1S2+, no murmur. DP 2+B LUNGS: CTAB. No wheezes EXT:  no edema MSK: no gross abnormalities.  NEURO: A&O x3.  CN II-XII intact.  PSYCH: normal mood. Good eye contact     Assessment & Plan:   Problem List Items Addressed This Visit       Endocrine   Diabetes mellitus type 2 in nonobese ([place]) - Primary   Relevant Medications   atorvastatin (LIPITOR) 40 MG tablet     Other   Dizziness   Tachycardia   DM type 2-chronic.  Mostly controlled on ozempic '2mg'$ -continue.  Cont TLC.  Sugars did spike when got steroid injection in knee but ok now.  Cont to monitor.  F/u 3 mo.  Refilled lipitor '40mg'$  Dizziness/tachycardia when gets up from sitting to standing-not every  time.  Has had card w/u.  Suspect possibly some neuropathy, poss POTS   avoid caffeine, wear compression stockings, stay hydrated, decrease nutrasweet.    Meds ordered this encounter  Medications   omeprazole (PRILOSEC) 20 MG capsule    Sig: TAKE 1 CAPSULE BY MOUTH EVERY DAY    Dispense:  90 capsule    Refill:  3   cephALEXin (KEFLEX) 250 MG capsule    Sig: Take 1 capsule (250 mg total) by mouth daily.    Dispense:  90 capsule    Refill:  3   atorvastatin (LIPITOR) 40 MG tablet    Sig: Take 1 tablet (40 mg total) by mouth daily.    Dispense:  90 tablet    Refill:  3    Wellington Hampshire, MD

## 2022-07-04 ENCOUNTER — Other Ambulatory Visit: Payer: Self-pay | Admitting: Internal Medicine

## 2022-07-27 NOTE — Progress Notes (Unsigned)
[place] [name] Phone: [phone] Subjective:   [name], am serving as a scribe for Dr. Hulan Saas.  I'm seeing this patient by the request  of:  [name], MD  CC: Neck pain, low back pain and hip pain  PIR:[id number]  Celisa Stutts Oshima is a 71 y.o. female coming in with complaint of back and neck pain. OMT 06/02/2022. Patient states that her back is ok. More neck pain, R knee and R hip pain. Was playing with her grandson and now her joints are more painful due to the activities and positions she had to get in.    Medications patient has been prescribed: Vit D  Taking:         Reviewed prior external information including notes and imaging from previsou exam, outside providers and external EMR if available.   As well as notes that were available from care everywhere and other healthcare systems.  Past medical history, social, surgical and family history all reviewed in electronic medical record.  No pertanent information unless stated regarding to the chief complaint.   Past Medical History:  Diagnosis Date   Acute bursitis of right shoulder 08/29/2018   Right shoulder injected in August 29, 2018   Allergic rhinitis    Closed displaced fracture of fifth metatarsal bone of left foot 03/16/2017   DEPRESSIVE DISORDER NOT ELSEWHERE CLASSIFIED 03/27/2010   Qualifier: Diagnosis of  By: [name] MD, [name]   [name], NP , [facility] [place]     Diabetes ([place]) 04/02/2016   Diabetes mellitus    Essential hypertension 03/14/2008   Qualifier: Diagnosis of  By: [name] MD, [name]     Fibromyalgia 08/12/2011   Diagnosed by [name] , Rheumatologist 2010    GERD (gastroesophageal reflux disease) 12/25/2016   Gluteal tendonitis of right buttock 10/19/2017   Injected in October 19, 2017 Injected October 07, 2018   Greater trochanteric bursitis of left hip 06/17/2016   Greater  trochanteric bursitis of right hip 11/27/2015   Injected 11/27/2015    History of recurrent UTIs    [name]   HTN (hypertension)    Hx of osteopenia 10/11/2012   [facility] DEXA; ordered by [name] Lowest T score: -2.1 @ lumbar spine @ [facility] 11/01/12; decrease of 8.5% vs 05/2009. There is 9.2% risk over 10 yrs History fracture left elbow @ 4 Fractured left wrist , right elbow and nose post fall July/18/2013. Dr. [name] No FH Osteoporosis No PMH of bisphosphonate therapy (generic Fosamax Rxed but not filled  due to polypharmacy )    Hyperlipidemia    Hyperlipidemia associated with type 2 diabetes mellitus ([place]) 01/18/2007   Qualifier: Diagnosis of  By: [name]  With DM LDL goal = < 100, ideally < 70. Mi in father @ 32; 2 brothers @ 87 & 69     Lumbar radiculopathy 04/11/2008   Qualifier: Diagnosis of  By: [name] MD, Erick Blinks well to epidural 01/12/2017  repeat epidural given May 2018   Migraine headache    quiescent   Nonallopathic lesion of cervical region 07/19/2014   Nonallopathic lesion of lumbosacral region 01/15/2016   Nonallopathic lesion of sacral region 06/27/2018   Nonallopathic lesion-rib cage 09/26/2014   Palpitations 04/15/2011   Rotator cuff impingement syndrome of left shoulder 05/11/2014   Seasonal allergic rhinitis 03/21/2012   Sinusitis, chronic 04/20/2016   Sleep disorder    Dr  Dohmier   Subluxation of peroneal tendon of right foot 11/10/2017   TIA (transient ischemic attack)    TRANSIENT ISCHEMIC ATTACKS, HX OF 02/02/2008   Qualifier: Diagnosis of  By: Donalee Citrin ADVRS EFF UNS RX MEDICINAL&BIOLOGICAL [place] 02/02/2008   Qualifier: Diagnosis of  By: [name] MD, Posey Pronto AND MYOSITIS 02/02/2008   Qualifier: Diagnosis of  By: [name] MD, [name]     UTI (urinary tract infection) 10/05/2014   Viral upper respiratory tract infection with cough 12/31/2014   Vitamin D deficiency 05/25/2008   Qualifier: Diagnosis of  By: [name] MD,  [name]      Allergies  Allergen Reactions   Vioxx [Rofecoxib] Other (See Comments)    Excess BP which caused TIA    Prednisone Other (See Comments)    Mental status changes with high-dose oral agent. Tolerates epidural steroid injections. No associated rash or fever   Macrodantin [Nitrofurantoin Macrocrystal] Hives   Sulfa Antibiotics     Hives   Colesevelam Other (See Comments)    Leg Pain     Review of Systems:  No headache, visual changes, nausea, vomiting, diarrhea, constipation, dizziness, abdominal pain, skin rash, fevers, chills, night sweats, weight loss, swollen lymph nodes, body aches, joint swelling, chest pain, shortness of breath, mood changes. POSITIVE muscle aches  Objective  Blood pressure 110/80, pulse 97, height '5\' 6"'$  (1.676 m), weight 142 lb (64.4 kg), SpO2 97 %.   General: No apparent distress alert and oriented x3 mood and affect normal, dressed appropriately.  HEENT: Pupils equal, extraocular movements intact  Respiratory: Patient's speak in full sentences and does not appear short of breath  Cardiovascular: No lower extremity edema, non tender, no erythema  Gait MSK:  Back low back exam does have some loss of lordosis.  Significant tightness noted over the greater trochanteric area on the right greater than left.  Positive FABER test noted.  Osteopathic findings  C2 flexed rotated and side bent right C6 flexed rotated and side bent left T3 extended rotated and side bent right inhaled rib T9 extended rotated and side bent left L2 flexed rotated and side bent right Sacrum right on right   After verbal consent patient was prepped with alcohol swab and with a 21-gauge 2 inch needle injected into the right greater trochanteric area with 2 cc of 0.5% Marcaine and 1 cc of toradol  30 mg/mL.  No blood loss.  Band-Aid placed.  Postinjection instructions given     Assessment and Plan:  Cervical disc disorder with radiculopathy of cervical region Chronic  with mild exacerbation.  Was playing with her grandkids on a more regular basis.  Has Zanaflex.  We discussed which activities to do and which ones to avoid, increase activity slowly otherwise.  Follow-up with me again in 6 to 8 weeks.  Greater trochanteric bursitis of right hip Recurrence of increasing pain.  Likely secondary to some of the repetitive activity when she was with her grandchildren.  As noted there was steroid and use Toradol secondary to patient having some difficulty with her brittle diabetes.  Discussed icing regimen and home exercises otherwise.  Follow-up with me again in 6 to 8 weeks.    Nonallopathic problems  Decision today to treat with OMT was based on Physical Exam  After verbal consent patient was treated with HVLA, ME, FPR techniques in cervical, rib, thoracic, lumbar, and sacral  areas  Patient tolerated the procedure well with improvement  in symptoms  Patient given exercises, stretches and lifestyle modifications  See medications in patient instructions if given  Patient will follow up in 4-8 weeks      The above documentation has been reviewed and is accurate and complete Lyndal Pulley, DO        Note: This dictation was prepared with Dragon dictation along with smaller phrase technology. Any transcriptional errors that result from this process are unintentional.

## 2022-07-28 ENCOUNTER — Ambulatory Visit (INDEPENDENT_AMBULATORY_CARE_PROVIDER_SITE_OTHER): Payer: Medicare Other | Admitting: Family Medicine

## 2022-07-28 VITALS — BP 110/80 | HR 97 | Ht 66.0 in | Wt 142.0 lb

## 2022-07-28 DIAGNOSIS — M9902 Segmental and somatic dysfunction of thoracic region: Secondary | ICD-10-CM

## 2022-07-28 DIAGNOSIS — M9908 Segmental and somatic dysfunction of rib cage: Secondary | ICD-10-CM | POA: Diagnosis not present

## 2022-07-28 DIAGNOSIS — M9904 Segmental and somatic dysfunction of sacral region: Secondary | ICD-10-CM

## 2022-07-28 DIAGNOSIS — M5010 Cervical disc disorder with radiculopathy, unspecified cervical region: Secondary | ICD-10-CM | POA: Diagnosis not present

## 2022-07-28 DIAGNOSIS — M25551 Pain in right hip: Secondary | ICD-10-CM | POA: Diagnosis not present

## 2022-07-28 DIAGNOSIS — M9903 Segmental and somatic dysfunction of lumbar region: Secondary | ICD-10-CM

## 2022-07-28 DIAGNOSIS — M9901 Segmental and somatic dysfunction of cervical region: Secondary | ICD-10-CM | POA: Diagnosis not present

## 2022-07-28 DIAGNOSIS — M7061 Trochanteric bursitis, right hip: Secondary | ICD-10-CM | POA: Diagnosis not present

## 2022-07-28 MED ORDER — KETOROLAC TROMETHAMINE 30 MG/ML IJ SOLN
30.0000 mg | Freq: Once | INTRAMUSCULAR | Status: AC
  Administered 2022-07-28: 30 mg via INTRAMUSCULAR

## 2022-07-28 NOTE — Patient Instructions (Signed)
Injected GT See me in 6 weeks

## 2022-07-28 NOTE — Assessment & Plan Note (Signed)
Chronic with mild exacerbation.  Was playing with her grandkids on a more regular basis.  Has Zanaflex.  We discussed which activities to do and which ones to avoid, increase activity slowly otherwise.  Follow-up with me again in 6 to 8 weeks.

## 2022-07-28 NOTE — Assessment & Plan Note (Signed)
Recurrence of increasing pain.  Likely secondary to some of the repetitive activity when she was with her grandchildren.  As noted there was steroid and use Toradol secondary to patient having some difficulty with her brittle diabetes.  Discussed icing regimen and home exercises otherwise.  Follow-up with me again in 6 to 8 weeks.

## 2022-08-19 ENCOUNTER — Telehealth: Payer: Medicare Other

## 2022-08-25 ENCOUNTER — Ambulatory Visit (INDEPENDENT_AMBULATORY_CARE_PROVIDER_SITE_OTHER): Payer: Medicare Other

## 2022-08-25 DIAGNOSIS — Z1211 Encounter for screening for malignant neoplasm of colon: Secondary | ICD-10-CM

## 2022-08-25 DIAGNOSIS — Z1231 Encounter for screening mammogram for malignant neoplasm of breast: Secondary | ICD-10-CM | POA: Diagnosis not present

## 2022-08-25 DIAGNOSIS — Z0000 Encounter for general adult medical examination without abnormal findings: Secondary | ICD-10-CM | POA: Diagnosis not present

## 2022-08-25 NOTE — Progress Notes (Signed)
Virtual Visit via Telephone Note  I connected with  Christine [name] on 08/25/22 at 10:30 AM EDT by telephone and verified that I am speaking with the correct person using two identifiers.  Medicare Annual Wellness visit completed telephonically due to Covid-19 pandemic.   Persons participating in this call: This Health Coach and this patient.   Location: Patient: home Provider: office   I discussed the limitations, risks, security and privacy concerns of performing an evaluation and management service by telephone and the availability of in person appointments. The patient expressed understanding and agreed to proceed.  Unable to perform video visit due to video visit attempted and failed and/or patient does not have video capability.   Some vital signs may be absent or patient reported.   Willette Brace, LPN   Subjective:   Christine [name] is a 71 y.o. female who presents for Medicare Annual (Subsequent) preventive examination.  Review of Systems     Cardiac Risk Factors include: advanced age (>46mn, >>100women);diabetes mellitus;dyslipidemia;hypertension     Objective:    There were no vitals filed for this visit. There is no height or weight on file to calculate BMI.     08/25/2022   10:40 AM 08/08/2021    8:00 PM 08/06/2021    5:35 PM 06/04/2021    4:27 PM 06/07/2020   10:59 PM 09/01/2019    5:47 PM 07/13/2019   10:40 AM  Advanced Directives  Does Patient Have a Medical Advance Directive? Yes Yes Yes Yes No Yes Yes  Type of [place]of [place] will Living will  [place] will     Does patient want to make changes to medical advance directive?  No - Patient declined  No - Patient declined  No - Patient declined No - Patient declined  Copy of HLluverasin Chart? No - copy requested   No - copy requested       Current Medications (verified) Outpatient Encounter Medications as of  08/25/2022  Medication Sig   acetaminophen (TYLENOL) 500 MG tablet Take 500 mg by mouth as needed.   aspirin 81 MG tablet Take 81 mg by mouth daily.   atorvastatin (LIPITOR) 40 MG tablet Take 1 tablet (40 mg total) by mouth daily.   BD PEN NEEDLE NANO 2ND GEN 32G X 4 MM MISC USE ONCE A DAY AS DIRECTED   busPIRone (BUSPAR) 15 MG tablet Take 15 mg by mouth 3 (three) times daily.   cephALEXin (KEFLEX) 250 MG capsule Take 1 capsule (250 mg total) by mouth daily.   DULoxetine (CYMBALTA) 60 MG capsule Take 60 mg by mouth daily.   Lancets (ONETOUCH ULTRASOFT) lancets Check blood sugar once daily. Dx code: 250.00   LORazepam (ATIVAN) 0.5 MG tablet Take 0.5 mg by mouth as needed for anxiety.   losartan (COZAAR) 50 MG tablet Take 1 tablet (50 mg total) by mouth daily.   omeprazole (PRILOSEC) 20 MG capsule TAKE 1 CAPSULE BY MOUTH EVERY DAY   ONETOUCH ULTRA test strip USE 1 STRIP TWICE DAILY DX CODE E11.65   promethazine (PHENERGAN) 12.5 MG tablet Take 1 tablet (12.5 mg total) by mouth every 8 (eight) hours as needed for nausea or vomiting.   Semaglutide, 2 MG/DOSE, (OZEMPIC, 2 MG/DOSE,) 8 MG/3ML SOPN Inject 2 mg into the skin once a week. Gets from pt assist   tiZANidine (ZANAFLEX) 2 MG tablet TAKE 1 TABLET BY MOUTH AT BEDTIME.   traZODone (DESYREL)  50 MG tablet TAKE 1/2 TO 1 TABLET BY MOUTH AT BEDTIME AS NEEDED FOR SLEEP   Vitamin D, Ergocalciferol, (DRISDOL) 1.25 MG (50000 UNIT) CAPS capsule TAKE 1 CAPSULE BY MOUTH ONE TIME PER WEEK   doxepin (SINEQUAN) 10 MG/ML solution SMARTSIG:0.6 Milliliter(s) By Mouth Every Night (Patient not taking: Reported on 08/25/2022)   vitamin B-12 (CYANOCOBALAMIN) 1000 MCG tablet Take 1,000 mcg by mouth daily. (Patient not taking: Reported on 08/25/2022)   No facility-administered encounter medications on file as of 08/25/2022.    Allergies (verified) Vioxx [rofecoxib], Prednisone, Macrodantin [nitrofurantoin macrocrystal], Sulfa antibiotics, and Colesevelam    History: Past Medical History:  Diagnosis Date   Acute bursitis of right shoulder 08/29/2018   Right shoulder injected in August 29, 2018   Allergic rhinitis    Closed displaced fracture of fifth metatarsal bone of left foot 03/16/2017   DEPRESSIVE DISORDER NOT ELSEWHERE CLASSIFIED 03/27/2010   Qualifier: Diagnosis of  By: [name] MD, [name]   [name], NP , [facility] [place]     Diabetes ([place]) 04/02/2016   Diabetes mellitus    Essential hypertension 03/14/2008   Qualifier: Diagnosis of  By: [name] MD, [name]     Fibromyalgia 08/12/2011   Diagnosed by [name] , Rheumatologist 2010    GERD (gastroesophageal reflux disease) 12/25/2016   Gluteal tendonitis of right buttock 10/19/2017   Injected in October 19, 2017 Injected October 07, 2018   Greater trochanteric bursitis of left hip 06/17/2016   Greater trochanteric bursitis of right hip 11/27/2015   Injected 11/27/2015    History of recurrent UTIs    [name]   HTN (hypertension)    Hx of osteopenia 10/11/2012   [facility] DEXA; ordered by [name] Lowest T score: -2.1 @ lumbar spine @ [facility] 11/01/12; decrease of 8.5% vs 05/2009. There is 9.2% risk over 10 yrs History fracture left elbow @ 4 Fractured left wrist , right elbow and nose post fall July/18/2013. Dr. [name] No FH Osteoporosis No PMH of bisphosphonate therapy (generic Fosamax Rxed but not filled  due to polypharmacy )    Hyperlipidemia    Hyperlipidemia associated with type 2 diabetes mellitus ([place]) 01/18/2007   Qualifier: Diagnosis of  By: [name]  With DM LDL goal = < 100, ideally < 70. Mi in father @ 27; 2 brothers @ 76 & 38     Lumbar radiculopathy 04/11/2008   Qualifier: Diagnosis of  By: [name] MD, Erick Blinks well to epidural 01/12/2017  repeat epidural given May 2018   Migraine headache    quiescent   Nonallopathic lesion of cervical region 07/19/2014   Nonallopathic lesion of lumbosacral region 01/15/2016   Nonallopathic lesion of  sacral region 06/27/2018   Nonallopathic lesion-rib cage 09/26/2014   Palpitations 04/15/2011   Rotator cuff impingement syndrome of left shoulder 05/11/2014   Seasonal allergic rhinitis 03/21/2012   Sinusitis, chronic 04/20/2016   Sleep disorder    Dr Dohmier   Subluxation of peroneal tendon of right foot 11/10/2017   TIA (transient ischemic attack)    [place], HX OF 02/02/2008   Qualifier: Diagnosis of  By: Donalee Citrin ADVRS EFF UNS RX MEDICINAL&BIOLOGICAL [place] 02/02/2008   Qualifier: Diagnosis of  By: [name] MD, Posey Pronto AND MYOSITIS 02/02/2008   Qualifier: Diagnosis of  By: [name] MD, [name]     UTI (urinary tract infection) 10/05/2014   Viral upper respiratory tract  infection with cough 12/31/2014   Vitamin D deficiency 05/25/2008   Qualifier: Diagnosis of  By: [name] MD, [name]     Past Surgical History:  Procedure Laterality Date   COLONOSCOPY     Dr Carlean [name]; hemorrhoids   CORONARY ANGIOPLASTY  12/15/1995   for chest pain- negative    KNEE ARTHROSCOPY Left 2023   POLYPECTOMY  12/14/2000   benign, hyperplastic polyp; rectal bleeding 2008   TONSILLECTOMY     TOTAL ABDOMINAL HYSTERECTOMY  12/14/1981   BSO for endometriosis   WISDOM TOOTH EXTRACTION     Family History  Problem Relation Age of Onset   Colon cancer Maternal Aunt    Diabetes Paternal Uncle    Heart attack Father 74   COPD Mother    Lung cancer Brother        smoker   Mental illness Other        niece, committed suicide.   Heart attack Other        brother X 2; @ 9 & 46   Stroke Neg Hx    Social History   Socioeconomic History   Marital status: Married    Spouse name: Not on file   Number of children: 2   Years of education: Not on file   Highest education level: Not on file  Occupational History   Occupation: Designer, television/film set: [employer]  Tobacco Use   Smoking status: Never   Smokeless tobacco: Never  Vaping  Use   Vaping Use: Never used  Substance and Sexual Activity   Alcohol use: No   Drug use: No   Sexual activity: Not on file  Other Topics Concern   Not on file  Social History Narrative   Regular exercise- no    Son dec 2011.  1 grand   Social Determinants of Health   Financial Resource Strain: Low Risk  (08/25/2022)   Overall Financial Resource Strain (CARDIA)    Difficulty of Paying Living Expenses: Not hard at all  Food Insecurity: No Food Insecurity (08/25/2022)   Hunger Vital Sign    Worried About Running Out of Food in the Last Year: Never true    Ran Out of Food in the Last Year: Never true  Transportation Needs: No Transportation Needs (08/25/2022)   PRAPARE - Hydrologist (Medical): No    Lack of Transportation (Non-Medical): No  Physical Activity: Inactive (08/25/2022)   Exercise Vital Sign    Days of Exercise per Week: 0 days    Minutes of Exercise per Session: 0 min  Stress: Stress Concern Present (08/25/2022)   [place]    Feeling of Stress : Rather much  Social Connections: Socially Integrated (08/25/2022)   Social Connection and Isolation Panel [NHANES]    Frequency of Communication with Friends and Family: More than three times a week    Frequency of Social Gatherings with Friends and Family: More than three times a week    Attends Religious Services: More than 4 times per year    Active Member of [employer] or Organizations: Yes    Attends Music therapist: More than 4 times per year    Marital Status: Married    Tobacco Counseling Counseling given: Not Answered   Clinical Intake:  Pre-visit preparation completed: Yes  Pain : No/denies pain     Diabetes: Yes CBG done?: Yes (111 per pt) CBG resulted in  Enter/ Edit results?: No Did pt. bring in CBG monitor from home?: No  How often do you need to have someone help you when you read instructions,  pamphlets, or other written materials from your doctor or pharmacy?: 1 - Never  Diabetic?Nutrition Risk Assessment:  Has the patient had any N/V/D within the last 2 months?  No  Does the patient have any non-healing wounds?  No  Has the patient had any unintentional weight loss or weight gain?  No   Diabetes:  Is the patient diabetic?  Yes  If diabetic, was a CBG obtained today?  Yes  Did the patient bring in their glucometer from home?  No  How often do you monitor your CBG's? daily.   Financial Strains and Diabetes Management:  Are you having any financial strains with the device, your supplies or your medication? No .  Does the patient want to be seen by Chronic Care Management for management of their diabetes?  No  Would the patient like to be referred to a Nutritionist or for Diabetic Management?  No   Diabetic Exams:  Diabetic Eye Exam: Completed 06/2022 per pt  Diabetic Foot Exam: Completed 09/23/21   Interpreter Needed?: No  Information entered by :: Charlott Rakes, LPN   Activities of Daily Living    08/25/2022   10:42 AM  In your present state of health, do you have any difficulty performing the following activities:  Hearing? 0  Vision? 0  Difficulty concentrating or making decisions? 0  Walking or climbing stairs? 1  Comment related to knees  Dressing or bathing? 0  Doing errands, shopping? 0  Preparing Food and eating ? N  Using the Toilet? N  In the past six months, have you accidently leaked urine? Y  Comment wears a pad  Do you have problems with loss of bowel control? N  Managing your Medications? N  Managing your Finances? N  Housekeeping or managing your Housekeeping? N    Patient Care Team: [name], MD as PCP - General (Family Medicine) Belva Crome, MD as PCP - Cardiology (Cardiology) Pieter Partridge, DO as Consulting Physician (Neurology) Szabat, Darnelle Maffucci, [facility] (Inactive) (Pharmacist)  Indicate any recent Medical Services you may  have received from other than [facility] providers in the past year (date may be approximate).     Assessment:   This is a routine wellness examination for Christine [name].  Hearing/Vision screen Hearing Screening - Comments:: Pt denies any hearing issues  Vision Screening - Comments:: Pt follows up with [facility]   Dietary issues and exercise activities discussed: Current Exercise Habits: The patient does not participate in regular exercise at present   Goals Addressed             This Visit's Progress    Patient Stated       Maintain blood sugar        Depression Screen    08/25/2022   10:38 AM 02/09/2022    1:41 PM 06/04/2021    4:25 PM 04/21/2021    1:12 PM 04/23/2020    1:30 PM 02/08/2019   10:35 AM 05/21/2016   10:03 AM  PHQ 2/9 Scores  PHQ - 2 Score 0 0 0 0 0 0 0    Fall Risk    08/25/2022   10:41 AM 07/01/2022   10:47 AM 02/09/2022    1:40 PM 06/04/2021    4:28 PM 04/21/2021    1:12 PM  Fall Risk  Falls in the past year? 1 0 0 0 0  Number falls in past yr: 1 0  0 0  Injury with Fall? 0 0  0   Comment fell near the tub      Risk for fall due to : Impaired vision No Fall Risks  No Fall Risks No Fall Risks  Follow up Falls prevention discussed Falls prevention discussed  Falls evaluation completed Falls evaluation completed    FALL RISK PREVENTION PERTAINING TO THE HOME:  Any stairs in or around the home? No  If so, are there any without handrails? No  Home free of loose throw rugs in walkways, pet beds, electrical cords, etc? Yes  Adequate lighting in your home to reduce risk of falls? Yes   ASSISTIVE DEVICES UTILIZED TO PREVENT FALLS:  Life alert? No  Use of a cane, walker or w/c? No  Grab bars in the bathroom? Yes  Shower chair or bench in shower? No  Elevated toilet seat or a handicapped toilet? No   TIMED UP AND GO:  Was the test performed? No .   Cognitive Function:        08/25/2022   10:43 AM  6CIT Screen  What Year? 0 points  What month? 0 points   What time? 0 points  Count back from 20 0 points  Months in reverse 0 points  Repeat phrase 2 points  Total Score 2 points    Immunizations Immunization History  Administered Date(s) Administered   Fluad Quad(high Dose 65+) 08/11/2019, 11/27/2020   Influenza Split 10/13/2011, 10/11/2012   Influenza Whole 11/17/2007, 09/26/2010   Influenza, High Dose Seasonal PF 09/10/2016, 09/30/2017, 09/08/2018   Influenza,inj,Quad PF,6+ Mos 11/07/2013, 08/29/2014, 09/13/2015   PFIZER(Purple Top)SARS-COV-2 Vaccination 02/11/2020, 03/12/2020, 02/06/2021   Pneumococcal Conjugate-13 12/24/2016   Pneumococcal Polysaccharide-23 02/08/2019   Td 06/07/2009    TDAP status: Up to date  Flu Vaccine status: Due, Education has been provided regarding the importance of this vaccine. Advised may receive this vaccine at local pharmacy or Health Dept. Aware to provide a copy of the vaccination record if obtained from local pharmacy or Health Dept. Verbalized acceptance and understanding.  Pneumococcal vaccine status: Up to date  Covid-19 vaccine status: Completed vaccines  Qualifies for Shingles Vaccine? Yes   Zostavax completed No   Shingrix Completed?: No.    Education has been provided regarding the importance of this vaccine. Patient has been advised to call insurance company to determine out of pocket expense if they have not yet received this vaccine. Advised may also receive vaccine at local pharmacy or Health Dept. Verbalized acceptance and understanding.  Screening Tests Health Maintenance  Topic Date Due   Zoster Vaccines- Shingrix (1 of 2) Never done   COLONOSCOPY (Pts 45-67yr Insurance coverage will need to be confirmed)  11/02/2017   OPHTHALMOLOGY EXAM  05/19/2019   TETANUS/TDAP  06/08/2019   MAMMOGRAM  05/21/2021   INFLUENZA VACCINE  07/14/2022   COVID-19 Vaccine (4 - Pfizer risk series) 09/24/2022 (Originally 04/03/2021)   FOOT EXAM  09/23/2022   HEMOGLOBIN A1C  10/29/2022   Pneumonia  Vaccine 71 Years old  Completed   DEXA SCAN  Completed   Hepatitis C Screening  Completed   HPV VACCINES  Aged Out    Health Maintenance  Health Maintenance Due  Topic Date Due   Zoster Vaccines- Shingrix (1 of 2) Never done   COLONOSCOPY (Pts 45-438yrInsurance coverage will need to be confirmed)  11/02/2017   OPHTHALMOLOGY EXAM  05/19/2019   TETANUS/TDAP  06/08/2019   MAMMOGRAM  05/21/2021   INFLUENZA VACCINE  07/14/2022    Colorectal cancer screening: Referral to GI placed 08/25/22. Pt aware the office will call re: appt.  Mammogram status: Ordered 08/25/22. Pt provided with contact info and advised to call to schedule appt.   Bone Density status: Completed 01/08/15. Results reflect: Bone density results: OSTEOPENIA. Repeat every 2 years.   Additional Screening:  Hepatitis C Screening:  Completed 11/25/15  Vision Screening: Recommended annual ophthalmology exams for early detection of glaucoma and other disorders of the eye. Is the patient up to date with their annual eye exam?  Yes  Who is the provider or what is the name of the office in which the patient attends annual eye exams? [facility] If pt is not established with a provider, would they like to be referred to a provider to establish care? No .   Dental Screening: Recommended annual dental exams for proper oral hygiene  Community Resource Referral / Chronic Care Management: CRR required this visit?  No   CCM required this visit?  No      Plan:     I have personally reviewed and noted the following in the patient's chart:   Medical and social history Use of alcohol, tobacco or illicit drugs  Current medications and supplements including opioid prescriptions. Patient is not currently taking opioid prescriptions. Functional ability and status Nutritional status Physical activity Advanced directives List of other physicians Hospitalizations, surgeries, and ER visits in previous 12 months Vitals Screenings  to include cognitive, depression, and falls Referrals and appointments  In addition, I have reviewed and discussed with patient certain preventive protocols, quality metrics, and best practice recommendations. A written personalized care plan for preventive services as well as general preventive health recommendations were provided to patient.     Willette Brace, LPN   4/49/2010   Nurse Notes: none

## 2022-08-25 NOTE — Patient Instructions (Signed)
Christine [name] , Thank you for taking time to come for your Medicare Wellness Visit. I appreciate your ongoing commitment to your health goals. Please review the following plan we discussed and let me know if I can assist you in the future.   Screening recommendations/referrals: Colonoscopy: order placed 08/25/22 Mammogram: order placed 08/25/22 Bone Density completed 01/08/15 Recommended yearly ophthalmology/optometry visit for glaucoma screening and checkup Recommended yearly dental visit for hygiene and checkup  Vaccinations: Influenza vaccine: due  Pneumococcal vaccine: Up to date Tdap vaccine: due and discussed  Shingles vaccine: Shingrix is 2 doses 2-6 months apart and over 90% effective    Covid-19:completed 2/22, 03/12/20 & 02/06/21  Advanced directives: Please bring a copy of your health care power of attorney and living will to the office at your convenience.  Conditions/risks identified: maintain blood sugar   Next appointment: Follow up in one year for your annual wellness visit    Preventive Care 73 Years and Older, Female Preventive care refers to lifestyle choices and visits with your health care provider that can promote health and wellness. What does preventive care include? A yearly physical exam. This is also called an annual well check. Dental exams once or twice a year. Routine eye exams. Ask your health care provider how often you should have your eyes checked. Personal lifestyle choices, including: Daily care of your teeth and gums. Regular physical activity. Eating a healthy diet. Avoiding tobacco and drug use. Limiting alcohol use. Practicing safe sex. Taking low-dose aspirin every day. Taking vitamin and mineral supplements as recommended by your health care provider. What happens during an annual well check? The services and screenings done by your health care provider during your annual well check will depend on your age, overall health, lifestyle risk  factors, and family history of disease. Counseling  Your health care provider may ask you questions about your: Alcohol use. Tobacco use. Drug use. Emotional well-being. Home and relationship well-being. Sexual activity. Eating habits. History of falls. Memory and ability to understand (cognition). Work and work Statistician. Reproductive health. Screening  You may have the following tests or measurements: Height, weight, and BMI. Blood pressure. Lipid and cholesterol levels. These may be checked every 5 years, or more frequently if you are over 41 years old. Skin check. Lung cancer screening. You may have this screening every year starting at age 75 if you have a 30-pack-year history of smoking and currently smoke or have quit within the past 15 years. Fecal occult blood test (FOBT) of the stool. You may have this test every year starting at age 60. Flexible sigmoidoscopy or colonoscopy. You may have a sigmoidoscopy every 5 years or a colonoscopy every 10 years starting at age 80. Hepatitis C blood test. Hepatitis B blood test. Sexually transmitted disease (STD) testing. Diabetes screening. This is done by checking your blood sugar (glucose) after you have not eaten for a while (fasting). You may have this done every 1-3 years. Bone density scan. This is done to screen for osteoporosis. You may have this done starting at age 18. Mammogram. This may be done every 1-2 years. Talk to your health care provider about how often you should have regular mammograms. Talk with your health care provider about your test results, treatment options, and if necessary, the need for more tests. Vaccines  Your health care provider may recommend certain vaccines, such as: Influenza vaccine. This is recommended every year. Tetanus, diphtheria, and acellular pertussis (Tdap, Td) vaccine. You may need a Td  booster every 10 years. Zoster vaccine. You may need this after age 21. Pneumococcal 13-valent  conjugate (PCV13) vaccine. One dose is recommended after age 41. Pneumococcal polysaccharide (PPSV23) vaccine. One dose is recommended after age 66. Talk to your health care provider about which screenings and vaccines you need and how often you need them. This information is not intended to replace advice given to you by your health care provider. Make sure you discuss any questions you have with your health care provider. Document Released: 12/27/2015 Document Revised: 08/19/2016 Document Reviewed: 10/01/2015 Elsevier Interactive Patient Education  2017 [place] Prevention in the Home Falls can cause injuries. They can happen to people of all ages. There are many things you can do to make your home safe and to help prevent falls. What can I do on the outside of my home? Regularly fix the edges of walkways and driveways and fix any cracks. Remove anything that might make you trip as you walk through a door, such as a raised step or threshold. Trim any bushes or trees on the path to your home. Use bright outdoor lighting. Clear any walking paths of anything that might make someone trip, such as rocks or tools. Regularly check to see if handrails are loose or broken. Make sure that both sides of any steps have handrails. Any raised decks and porches should have guardrails on the edges. Have any leaves, snow, or ice cleared regularly. Use sand or salt on walking paths during winter. Clean up any spills in your garage right away. This includes oil or grease spills. What can I do in the bathroom? Use night lights. Install grab bars by the toilet and in the tub and shower. Do not use towel bars as grab bars. Use non-skid mats or decals in the tub or shower. If you need to sit down in the shower, use a plastic, non-slip stool. Keep the floor dry. Clean up any water that spills on the floor as soon as it happens. Remove soap buildup in the tub or shower regularly. Attach bath mats  securely with double-sided non-slip rug tape. Do not have throw rugs and other things on the floor that can make you trip. What can I do in the bedroom? Use night lights. Make sure that you have a light by your bed that is easy to reach. Do not use any sheets or blankets that are too big for your bed. They should not hang down onto the floor. Have a firm chair that has side arms. You can use this for support while you get dressed. Do not have throw rugs and other things on the floor that can make you trip. What can I do in the kitchen? Clean up any spills right away. Avoid walking on wet floors. Keep items that you use a lot in easy-to-reach places. If you need to reach something above you, use a strong step stool that has a grab bar. Keep electrical cords out of the way. Do not use floor polish or wax that makes floors slippery. If you must use wax, use non-skid floor wax. Do not have throw rugs and other things on the floor that can make you trip. What can I do with my stairs? Do not leave any items on the stairs. Make sure that there are handrails on both sides of the stairs and use them. Fix handrails that are broken or loose. Make sure that handrails are as long as the stairways. Check any carpeting  to make sure that it is firmly attached to the stairs. Fix any carpet that is loose or worn. Avoid having throw rugs at the top or bottom of the stairs. If you do have throw rugs, attach them to the floor with carpet tape. Make sure that you have a light switch at the top of the stairs and the bottom of the stairs. If you do not have them, ask someone to add them for you. What else can I do to help prevent falls? Wear shoes that: Do not have high heels. Have rubber bottoms. Are comfortable and fit you well. Are closed at the toe. Do not wear sandals. If you use a stepladder: Make sure that it is fully opened. Do not climb a closed stepladder. Make sure that both sides of the stepladder  are locked into place. Ask someone to hold it for you, if possible. Clearly mark and make sure that you can see: Any grab bars or handrails. First and last steps. Where the edge of each step is. Use tools that help you move around (mobility aids) if they are needed. These include: Canes. Walkers. Scooters. Crutches. Turn on the lights when you go into a dark area. Replace any light bulbs as soon as they burn out. Set up your furniture so you have a clear path. Avoid moving your furniture around. If any of your floors are uneven, fix them. If there are any pets around you, be aware of where they are. Review your medicines with your doctor. Some medicines can make you feel dizzy. This can increase your chance of falling. Ask your doctor what other things that you can do to help prevent falls. This information is not intended to replace advice given to you by your health care provider. Make sure you discuss any questions you have with your health care provider. Document Released: 09/26/2009 Document Revised: 05/07/2016 Document Reviewed: 01/04/2015 Elsevier Interactive Patient Education  2017 [employer].

## 2022-08-25 NOTE — Addendum Note (Signed)
Addended by: Willette Brace on: 08/25/2022 10:57 AM   Modules accepted: Orders

## 2022-08-26 DIAGNOSIS — F410 Panic disorder [episodic paroxysmal anxiety] without agoraphobia: Secondary | ICD-10-CM | POA: Diagnosis not present

## 2022-08-26 DIAGNOSIS — F339 Major depressive disorder, recurrent, unspecified: Secondary | ICD-10-CM | POA: Diagnosis not present

## 2022-09-03 ENCOUNTER — Telehealth: Payer: Self-pay

## 2022-09-03 NOTE — Telephone Encounter (Signed)
Attempted to contact the patient regarding patient assistance. 4 boxes of Ozempic received. LVM

## 2022-09-03 NOTE — Telephone Encounter (Signed)
Patient picked up patient assistance - log noted  

## 2022-09-07 ENCOUNTER — Encounter: Payer: Self-pay | Admitting: *Deleted

## 2022-09-07 NOTE — Progress Notes (Unsigned)
[name] Phone: [phone] Subjective:   [name], am serving as a scribe for Dr. Hulan Saas.  I'm seeing this patient by the request  of:  Christine Crook, MD  CC: back and neck pain follow up   [id number]  Christine [name] is a 71 y.o. female coming in with complaint of back and neck pain. OMT 07/28/2022. Also f/u for R GT pain. Pain in hip is better today but last week her pain was worse.   Patient states that B knees are also bothering her. Had a hard time getting out of bed due to knee pain last week. Pain over anterior aspect. Can feel something moving in R knee when she turned the wrong way.   Medications patient has been prescribed: Vit D  Taking:         Reviewed prior external information including notes and imaging from previsou exam, outside providers and external EMR if available.   As well as notes that were available from care everywhere and other healthcare systems.  Past medical history, social, surgical and family history all reviewed in electronic medical record.  No pertanent information unless stated regarding to the chief complaint.   Past Medical History:  Diagnosis Date   Acute bursitis of right shoulder 08/29/2018   Right shoulder injected in August 29, 2018   Allergic rhinitis    Closed displaced fracture of fifth metatarsal bone of left foot 03/16/2017   DEPRESSIVE DISORDER NOT ELSEWHERE CLASSIFIED 03/27/2010   Qualifier: Diagnosis of  By: [name] MD, [name]   Christine Christine Manila, NP , [facility] [place]     Diabetes ([place]) 04/02/2016   Diabetes mellitus    Essential hypertension 03/14/2008   Qualifier: Diagnosis of  By: [name] MD, [name]     Fibromyalgia 08/12/2011   Diagnosed by [name] , Rheumatologist 2010    GERD (gastroesophageal reflux disease) 12/25/2016   Gluteal tendonitis of right buttock 10/19/2017   Injected in October 19, 2017  Injected October 07, 2018   Greater trochanteric bursitis of left hip 06/17/2016   Greater trochanteric bursitis of right hip 11/27/2015   Injected 11/27/2015    History of recurrent UTIs    [name]   HTN (hypertension)    Hx of osteopenia 10/11/2012   [facility] DEXA; ordered by [name] Lowest T score: -2.1 @ lumbar spine @ [facility] 11/01/12; decrease of 8.5% vs 05/2009. There is 9.2% risk over 10 yrs History fracture left elbow @ 4 Fractured left wrist , right elbow and nose post fall July/18/2013. Dr. [name] No FH Osteoporosis No PMH of bisphosphonate therapy (generic Fosamax Rxed but not filled  due to polypharmacy )    Hyperlipidemia    Hyperlipidemia associated with type 2 diabetes mellitus ([place]) 01/18/2007   Qualifier: Diagnosis of  By: [name]  With DM LDL goal = < 100, ideally < 70. Mi in father @ 40; 2 brothers @ 29 & 62     Lumbar radiculopathy 04/11/2008   Qualifier: Diagnosis of  By: [name] MD, Erick Blinks well to epidural 01/12/2017  repeat epidural given May 2018   Migraine headache    quiescent   Nonallopathic lesion of cervical region 07/19/2014   Nonallopathic lesion of lumbosacral region 01/15/2016   Nonallopathic lesion of sacral region 06/27/2018   Nonallopathic lesion-rib cage 09/26/2014   Palpitations 04/15/2011   Rotator cuff impingement syndrome of left shoulder  05/11/2014   Seasonal allergic rhinitis 03/21/2012   Sinusitis, chronic 04/20/2016   Sleep disorder    Dr Dohmier   Subluxation of peroneal tendon of right foot 11/10/2017   TIA (transient ischemic attack)    [place], HX OF 02/02/2008   Qualifier: Diagnosis of  By: Donalee Citrin ADVRS EFF UNS RX MEDICINAL&BIOLOGICAL [place] 02/02/2008   Qualifier: Diagnosis of  By: [name] MD, Posey Pronto AND MYOSITIS 02/02/2008   Qualifier: Diagnosis of  By: [name] MD, [name]     UTI (urinary tract infection) 10/05/2014   Viral upper respiratory tract infection with  cough 12/31/2014   Vitamin D deficiency 05/25/2008   Qualifier: Diagnosis of  By: [name] MD, [name]      Allergies  Allergen Reactions   Vioxx [Rofecoxib] Other (See Comments)    Excess BP which caused TIA    Prednisone Other (See Comments)    Mental status changes with high-dose oral agent. Tolerates epidural steroid injections. No associated rash or fever   Macrodantin [Nitrofurantoin Macrocrystal] Hives   Sulfa Antibiotics     Hives   Colesevelam Other (See Comments)    Leg Pain     Review of Systems:  No headache, visual changes, nausea, vomiting, diarrhea, constipation, dizziness, abdominal pain, skin rash, fevers, chills, night sweats, weight loss, swollen lymph nodes, body aches, joint swelling, chest pain, shortness of breath, mood changes. POSITIVE muscle aches  Objective  Blood pressure (!) 132/92, pulse 75, height '5\' 6"'$  (1.676 m), weight 143 lb (64.9 kg), SpO2 98 %.   General: No apparent distress alert and oriented x3 mood and affect normal, dressed appropriately.  HEENT: Pupils equal, extraocular movements intact  Respiratory: Patient's speak in full sentences and does not appear short of breath  Cardiovascular: No lower extremity edema, non tender, no erythema  MSK:  Back increased tightness noted over the paraspinal musculature more so on the right greater than left.  Seems to be around the sacroiliac joint and still severe over the greater trochanteric area.  Patient does have a positive FABER test noted.  Significant tightness of the neck right greater than left as well.  Osteopathic findings  C2 flexed rotated and side bent right C7 flexed rotated and side bent right T3 extended rotated and side bent right inhaled rib T11 extended rotated and side bent left L1 flexed rotated and side bent right Sacrum right on right    After verbal consent patient was prepped with alcohol swab and with a 21-gauge 2 inch needle injected into the right greater trochanteric  area with 2 cc of 0.5% Marcaine and 1 cc of Kenalog 40 mg/mL.  No blood loss.  Band-Aid placed.  Postinjection instructions given    Assessment and Plan:  Greater trochanteric bursitis of right hip Repeat injection given.  Tolerated the procedure well.  Do believe the patient should do well with conservative therapy.  Patient is still under significant amount of stress.  We discussed which activities to do and which ones to avoid.  Follow-up with me again in 6 to 8 weeks  Fibromyalgia Likely flare noted.  Discussed which activities to do and which ones to avoid follow-up with me again in 6 to 8 weeks continue the duloxetine    Nonallopathic problems  Decision today to treat with OMT was based on Physical Exam  After verbal consent patient was treated with HVLA, ME, FPR techniques in cervical, rib, thoracic, lumbar, and  sacral  areas  Patient tolerated the procedure well with improvement in symptoms  Patient given exercises, stretches and lifestyle modifications  See medications in patient instructions if given  Patient will follow up in 4-8 weeks    The above documentation has been reviewed and is accurate and complete Lyndal Pulley, DO          Note: This dictation was prepared with Dragon dictation along with smaller phrase technology. Any transcriptional errors that result from this process are unintentional.

## 2022-09-08 ENCOUNTER — Ambulatory Visit (INDEPENDENT_AMBULATORY_CARE_PROVIDER_SITE_OTHER): Payer: Medicare Other | Admitting: Family Medicine

## 2022-09-08 VITALS — BP 132/92 | HR 75 | Ht 66.0 in | Wt 143.0 lb

## 2022-09-08 DIAGNOSIS — M7061 Trochanteric bursitis, right hip: Secondary | ICD-10-CM | POA: Diagnosis not present

## 2022-09-08 DIAGNOSIS — M9902 Segmental and somatic dysfunction of thoracic region: Secondary | ICD-10-CM | POA: Diagnosis not present

## 2022-09-08 DIAGNOSIS — M797 Fibromyalgia: Secondary | ICD-10-CM | POA: Diagnosis not present

## 2022-09-08 DIAGNOSIS — M9908 Segmental and somatic dysfunction of rib cage: Secondary | ICD-10-CM

## 2022-09-08 DIAGNOSIS — M9903 Segmental and somatic dysfunction of lumbar region: Secondary | ICD-10-CM

## 2022-09-08 DIAGNOSIS — M9904 Segmental and somatic dysfunction of sacral region: Secondary | ICD-10-CM | POA: Diagnosis not present

## 2022-09-08 DIAGNOSIS — M9901 Segmental and somatic dysfunction of cervical region: Secondary | ICD-10-CM | POA: Diagnosis not present

## 2022-09-08 MED ORDER — TIZANIDINE HCL 4 MG PO TABS: 4.0000 mg | ORAL_TABLET | Freq: Every day | ORAL | 0 refills | Status: DC

## 2022-09-08 MED ORDER — NABUMETONE 500 MG PO TABS: 500.0000 mg | ORAL_TABLET | Freq: Every day | ORAL | 0 refills | Status: DC

## 2022-09-08 NOTE — Patient Instructions (Addendum)
Injection in GT today Relafen take 5 days at a time Zanaflex '4mg'$  See me again in 5 weeks

## 2022-09-08 NOTE — Assessment & Plan Note (Signed)
Repeat injection given.  Tolerated the procedure well.  Do believe the patient should do well with conservative therapy.  Patient is still under significant amount of stress.  We discussed which activities to do and which ones to avoid.  Follow-up with me again in 6 to 8 weeks

## 2022-09-08 NOTE — Assessment & Plan Note (Addendum)
Likely flare noted.  Discussed which activities to do and which ones to avoid follow-up with me again in 6 to 8 weeks continue the duloxetine

## 2022-09-11 ENCOUNTER — Ambulatory Visit (INDEPENDENT_AMBULATORY_CARE_PROVIDER_SITE_OTHER): Payer: Medicare Other

## 2022-09-11 ENCOUNTER — Ambulatory Visit
Admission: EM | Admit: 2022-09-11 | Discharge: 2022-09-11 | Disposition: A | Discharge status: Home / Self Care | Payer: Medicare Other | Attending: Physician Assistant | Admitting: Physician Assistant

## 2022-09-11 DIAGNOSIS — R9389 Abnormal findings on diagnostic imaging of other specified body structures: Secondary | ICD-10-CM

## 2022-09-11 DIAGNOSIS — M79672 Pain in left foot: Secondary | ICD-10-CM

## 2022-09-11 NOTE — ED Triage Notes (Signed)
Pt states left heel pain since yesterday, denies injury.

## 2022-09-11 NOTE — ED Provider Notes (Signed)
[facility]    CSN: [id number] Arrival date & time: 09/11/22  1243      History   Chief Complaint Chief Complaint  Patient presents with   Foot Pain    HPI Christine [name] is a 71 y.o. female.   Patient here today for evaluation of pain in her left heel that started yesterday.  She has not had any known injury.  She does report having fracture to the lateral aspect of her left foot in the past.  She also notes that about 30 years ago she had injections into her heel and foot.  She does not report any numbness or tingling.  She has not had any treatment for current symptoms.   Foot Pain Pertinent negatives include no shortness of breath.    Past Medical History:  Diagnosis Date   Acute bursitis of right shoulder 08/29/2018   Right shoulder injected in August 29, 2018   Allergic rhinitis    Closed displaced fracture of fifth metatarsal bone of left foot 03/16/2017   DEPRESSIVE DISORDER NOT ELSEWHERE CLASSIFIED 03/27/2010   Qualifier: Diagnosis of  By: [name] MD, [name]   Mr Christine Manila, NP , [facility] [place]     Diabetes (Christine [name]) 04/02/2016   Diabetes mellitus    Essential hypertension 03/14/2008   Qualifier: Diagnosis of  By: [name] MD, [name]     Fibromyalgia 08/12/2011   Diagnosed by [name] , Rheumatologist 2010    GERD (gastroesophageal reflux disease) 12/25/2016   Gluteal tendonitis of right buttock 10/19/2017   Injected in October 19, 2017 Injected October 07, 2018   Greater trochanteric bursitis of left hip 06/17/2016   Greater trochanteric bursitis of right hip 11/27/2015   Injected 11/27/2015    History of recurrent UTIs    [name]   HTN (hypertension)    Hx of osteopenia 10/11/2012   [facility] DEXA; ordered by [name] Lowest T score: -2.1 @ lumbar spine @ [facility] 11/01/12; decrease of 8.5% vs 05/2009. There is 9.2% risk over 10 yrs History fracture left elbow @ 4 Fractured left wrist , right elbow and nose post fall  July/18/2013. Dr. [name] No FH Osteoporosis No PMH of bisphosphonate therapy (generic Fosamax Rxed but not filled  due to polypharmacy )    Hyperlipidemia    Hyperlipidemia associated with type 2 diabetes mellitus ([place]) 01/18/2007   Qualifier: Diagnosis of  By: [name]  With DM LDL goal = < 100, ideally < 70. Mi in father @ 26; 2 brothers @ 1 & 79     Lumbar radiculopathy 04/11/2008   Qualifier: Diagnosis of  By: [name] MD, Erick Blinks well to epidural 01/12/2017  repeat epidural given May 2018   Migraine headache    quiescent   Nonallopathic lesion of cervical region 07/19/2014   Nonallopathic lesion of lumbosacral region 01/15/2016   Nonallopathic lesion of sacral region 06/27/2018   Nonallopathic lesion-rib cage 09/26/2014   Palpitations 04/15/2011   Rotator cuff impingement syndrome of left shoulder 05/11/2014   Seasonal allergic rhinitis 03/21/2012   Sinusitis, chronic 04/20/2016   Sleep disorder    Dr Dohmier   Subluxation of peroneal tendon of right foot 11/10/2017   TIA (transient ischemic attack)    [place], HX OF 02/02/2008   Qualifier: Diagnosis of  By: Donalee Citrin ADVRS EFF UNS RX MEDICINAL&BIOLOGICAL [place] 02/02/2008   Qualifier: Diagnosis of  By: [name] MD, [name]  UNSPECIFIED MYALGIA AND MYOSITIS 02/02/2008   Qualifier: Diagnosis of  By: [name] MD, [name]     UTI (urinary tract infection) 10/05/2014   Viral upper respiratory tract infection with cough 12/31/2014   Vitamin D deficiency 05/25/2008   Qualifier: Diagnosis of  By: [name] MD, [name]      Patient Active Problem List   Diagnosis Date Noted   Rosacea 01/08/2022   Seborrheic keratosis 01/08/2022   Baker's cyst of knee, left 10/09/2021   Acute medial meniscus tear of left knee 09/02/2021   Bacteremia 08/15/2021   Bacteria in urine 08/07/2021   Urinary hesitancy 08/07/2021   Elevated troponin 08/07/2021   Diabetes mellitus type 2 in nonobese ([name]) 08/07/2021    Tachycardia 08/07/2021   Sepsis ([place]) 08/06/2021   Dizziness 04/22/2021   Arthritis of right hip 02/12/2021   Leg swelling 07/12/2020   Cervical disc disorder with radiculopathy of cervical region 01/25/2020   Coronary atherosclerosis of native coronary artery 01/25/2020   Subluxation of peroneal tendon of right foot 11/10/2017   Lumbar radiculopathy, right 06/23/2017   Closed displaced fracture of fifth metatarsal bone of left foot 03/16/2017   GERD (gastroesophageal reflux disease) 12/25/2016   Migraine 05/06/2016   Diabetes ([name]) 04/02/2016   Nonallopathic lesion of lumbosacral region 01/15/2016   Greater trochanteric bursitis of right hip 11/27/2015   Routine general medical examination at a health care facility 11/25/2015   Nonallopathic lesion of thoracic region 09/26/2014   Nonallopathic lesion-rib cage 09/26/2014   Nonallopathic lesion of cervical region 07/19/2014   Hx of osteopenia 10/11/2012   Seasonal allergic rhinitis 03/21/2012   Fibromyalgia 08/12/2011   Palpitations 04/15/2011   DEPRESSIVE DISORDER NOT ELSEWHERE CLASSIFIED 03/27/2010   Vitamin D deficiency 05/25/2008   Lumbar radiculopathy 04/11/2008   Essential hypertension 03/14/2008   History of cardiovascular disorder 02/02/2008   Hyperlipidemia associated with type 2 diabetes mellitus ([name]) 01/18/2007    Past Surgical History:  Procedure Laterality Date   COLONOSCOPY     Dr Carlean [name]; hemorrhoids   CORONARY ANGIOPLASTY  12/15/1995   for chest pain- negative    KNEE ARTHROSCOPY Left 2023   POLYPECTOMY  12/14/2000   benign, hyperplastic polyp; rectal bleeding 2008   TONSILLECTOMY     TOTAL ABDOMINAL HYSTERECTOMY  12/14/1981   BSO for endometriosis   WISDOM TOOTH EXTRACTION      OB History   No obstetric history on file.      Home Medications    Prior to Admission medications   Medication Sig Start Date End Date Taking? Authorizing Provider  acetaminophen (TYLENOL) 500 MG tablet Take 500 mg by  mouth as needed.    [provider]  aspirin 81 MG tablet Take 81 mg by mouth daily.    [provider]  atorvastatin (LIPITOR) 40 MG tablet Take 1 tablet (40 mg total) by mouth daily. 07/01/22   [name], MD  BD PEN NEEDLE NANO 2ND GEN 32G X 4 MM MISC USE ONCE A DAY AS DIRECTED 07/15/21   Renato Shin, MD  busPIRone (BUSPAR) 15 MG tablet Take 15 mg by mouth 3 (three) times daily. 07/16/21   [provider]  cephALEXin (KEFLEX) 250 MG capsule Take 1 capsule (250 mg total) by mouth daily. 07/01/22   [name], MD  doxepin [facility]) 10 MG/ML solution  06/04/22   [provider]  DULoxetine (CYMBALTA) 60 MG capsule Take 60 mg by mouth daily.    [provider]  Lancets [facility] ULTRASOFT) lancets  Check blood sugar once daily. Dx code: 250.00 11/08/13   Hendricks Limes, MD  LORazepam (ATIVAN) 0.5 MG tablet Take 0.5 mg by mouth as needed for anxiety.    [provider]  losartan (COZAAR) 50 MG tablet Take 1 tablet (50 mg total) by mouth daily. 09/29/21   Belva Crome, MD  nabumetone (RELAFEN) 500 MG tablet Take 1 tablet (500 mg total) by mouth daily. 09/08/22   Lyndal Pulley, DO  omeprazole (PRILOSEC) 20 MG capsule TAKE 1 CAPSULE BY MOUTH EVERY DAY 07/01/22   [name], MD  [facility] ULTRA test strip USE 1 STRIP TWICE DAILY DX CODE E11.65 03/09/22   Renato Shin, MD  promethazine (PHENERGAN) 12.5 MG tablet Take 1 tablet (12.5 mg total) by mouth every 8 (eight) hours as needed for nausea or vomiting. 04/21/21   [name], MD  Semaglutide, 2 MG/DOSE, (OZEMPIC, 2 MG/DOSE,) 8 MG/3ML SOPN Inject 2 mg into the skin once a week. Gets from pt assist 12/18/21   Renato Shin, MD  tiZANidine (ZANAFLEX) 4 MG tablet Take 1 tablet (4 mg total) by mouth at bedtime. 09/08/22   Lyndal Pulley, DO  traZODone (DESYREL) 50 MG tablet TAKE 1/2 TO 1 TABLET BY MOUTH AT BEDTIME AS NEEDED FOR SLEEP 07/06/22   [name], MD  vitamin B-12  (CYANOCOBALAMIN) 1000 MCG tablet Take 1,000 mcg by mouth daily.    [provider]  Vitamin D, Ergocalciferol, (DRISDOL) 1.25 MG (50000 UNIT) CAPS capsule TAKE 1 CAPSULE BY MOUTH ONE TIME PER WEEK 05/26/22   Lyndal Pulley, DO    Family History Family History  Problem Relation Age of Onset   Colon cancer Maternal Aunt    Diabetes Paternal Uncle    Heart attack Father 71   COPD Mother    Lung cancer Brother        smoker   Mental illness Other        niece, committed suicide.   Heart attack Other        brother X 2; @ 55 & 61   Stroke Neg Hx     Social History Social History   Tobacco Use   Smoking status: Never   Smokeless tobacco: Never  Vaping Use   Vaping Use: Never used  Substance Use Topics   Alcohol use: No   Drug use: No     Allergies   Vioxx [rofecoxib], Prednisone, Macrodantin [nitrofurantoin macrocrystal], Sulfa antibiotics, and Colesevelam   Review of Systems Review of Systems  Constitutional:  Negative for chills and fever.  Eyes:  Negative for discharge and redness.  Respiratory:  Negative for shortness of breath.   Gastrointestinal:  Negative for nausea and vomiting.  Musculoskeletal:  Positive for arthralgias. Negative for joint swelling.  Skin:  Negative for color change.     Physical Exam Triage Vital Signs ED Triage Vitals  Enc Vitals Group     BP 09/11/22 1328 133/75     Pulse Rate 09/11/22 1328 98     Resp 09/11/22 1328 16     Temp 09/11/22 1328 (!) 97.5 F (36.4 C)     Temp Source 09/11/22 1328 Oral     SpO2 09/11/22 1328 98 %     Weight --      Height --      Head Circumference --      Peak Flow --      Pain Score 09/11/22 1329 8     Pain Loc --  Pain Edu? --      Excl. in [place]? --    No data found.  Updated Vital Signs BP 133/75 (BP Location: Left Arm)   Pulse 98   Temp (!) 97.5 F (36.4 C) (Oral)   Resp 16   LMP  (LMP Unknown)   SpO2 98%   Physical Exam Vitals and nursing note reviewed.   Constitutional:      General: She is not in acute distress.    Appearance: Normal appearance. She is not ill-appearing.  HENT:     Head: Normocephalic and atraumatic.  Eyes:     Conjunctiva/sclera: Conjunctivae normal.  Cardiovascular:     Rate and Rhythm: Normal rate.  Pulmonary:     Effort: Pulmonary effort is normal. No respiratory distress.  Musculoskeletal:     Comments: Normal appearance of left foot with no swelling or color change noted  Neurological:     Mental Status: She is alert.  Psychiatric:        Mood and Affect: Mood normal.        Behavior: Behavior normal.      UC Treatments / Results  Labs (all labs ordered are listed, but only abnormal results are displayed) Labs Reviewed - No data to display  EKG   Radiology DG Os Calcis Left  Result Date: 09/11/2022 CLINICAL DATA:  Left heel pain, no injury EXAM: LEFT OS CALCIS - 2+ VIEW COMPARISON:  None Available. FINDINGS: Articular space narrowing and spurring in the midfoot and Chopart joint compatible with osteoarthritis. Plantar and Achilles calcaneal spurs. Along the posterior-inferior-lateral margin the calcaneus, there are scattered somewhat amorphous but corticated calcifications surrounding a 1.9 by 1.1 by 1.3 cm irregular calcified lesion. IMPRESSION: 1. 1.9 cm in long axis irregular calcified lesion along the lateral plantar heel. Possibilities in this appearance include uremic tumoral calcinosis, exuberant calcification related to corticosteroid injection, extraskeletal osteochondroma, dystrophic calcification, or heterotopic ossification. Heavily calcified vascular tumor such as hemangioma considered less likely differential diagnostic consideration. 2. Osteoarthritis of the hindfoot and midfoot. Plantar and Achilles calcaneal spurs. Electronically Signed   By: Van Clines M.D.   On: 09/11/2022 13:55    Procedures Procedures (including critical care time)  Medications Ordered in UC Medications -  No data to display  Initial Impression / Assessment and Plan / UC Course  I have reviewed the triage vital signs and the nursing notes.  Pertinent labs & imaging results that were available during my care of the patient were reviewed by me and considered in my medical decision making (see chart for details).    Nonspecific findings on x-ray.  Recommended further evaluation by podiatry.  Offered steroid prescription which patient kindly declines at this time.  Recommend she take naproxen and Tylenol if needed for pain.  Encouraged follow-up with any further concerns.  Final Clinical Impressions(s) / UC Diagnoses   Final diagnoses:  Pain of left heel  Abnormal x-ray   Discharge Instructions   None    ED Prescriptions   None    PDMP not reviewed this encounter.   Francene Finders, PA-C 09/11/22 1549

## 2022-09-14 ENCOUNTER — Ambulatory Visit (INDEPENDENT_AMBULATORY_CARE_PROVIDER_SITE_OTHER): Payer: Medicare Other | Admitting: Family Medicine

## 2022-09-14 VITALS — BP 138/80 | HR 85 | Ht 66.0 in | Wt 141.0 lb

## 2022-09-14 DIAGNOSIS — R2242 Localized swelling, mass and lump, left lower limb: Secondary | ICD-10-CM

## 2022-09-14 DIAGNOSIS — I10 Essential (primary) hypertension: Secondary | ICD-10-CM | POA: Diagnosis not present

## 2022-09-14 LAB — COMPREHENSIVE METABOLIC PANEL
ALT: 24 U/L (ref 0–35)
AST: 16 U/L (ref 0–37)
Albumin: 4.2 g/dL (ref 3.5–5.2)
Alkaline Phosphatase: 59 U/L (ref 39–117)
BUN: 20 mg/dL (ref 6–23)
CO2: 28 mEq/L (ref 19–32)
Calcium: 9.5 mg/dL (ref 8.4–10.5)
Chloride: 100 mEq/L (ref 96–112)
Creatinine, Ser: 0.87 mg/dL (ref 0.40–1.20)
GFR: 66.16 mL/min (ref >60.00)
Glucose, Bld: 127 mg/dL — ABNORMAL HIGH (ref 70–99)
Potassium: 3.6 mEq/L (ref 3.5–5.1)
Sodium: 135 mEq/L (ref 135–145)
Total Bilirubin: 0.5 mg/dL (ref 0.2–1.2)
Total Protein: 6.9 g/dL (ref 6.0–8.3)

## 2022-09-14 NOTE — Patient Instructions (Addendum)
Thank you for coming in today.   Please get labs today before you leave   You should hear from MRI scheduling within 1 week. If you do not hear please let me know.    Recheck with Dr Georgina Snell or Dr Tamala Julian following the MRI.   Let me know if you have any problems.   Consider crocs for house shoes.

## 2022-09-14 NOTE — Progress Notes (Signed)
I, Peterson Lombard, LAT, ATC acting as a scribe for Lynne Leader, MD.  Christine [name] is a 71 y.o. female who presents to [place] [facility] today for L heel pain. Pt was previously seen by Dr. Tamala Julian on 09/08/22 for fibromyalgia, R GT bursitis, which was injected, and OMT. Pt was seen at [facility] on 09/11/22 c/o L heel pain that started on 9/28 w/ no MOI. Pt locates pain to the lateral-posterior aspect of the L calcaneous. Pt notes the area will swell and bruise.   Dx imaging: 09/11/22 L os calcis XR  Pertinent review of systems: No fevers or chills  Relevant historical information: Diabetes hypertension.  Kidney function checked about 13 months ago.   Exam:  BP 138/80   Pulse 85   Ht '5\' 6"'$  (1.676 m)   Wt 141 lb (64 kg)   LMP  (LMP Unknown)   SpO2 96%   BMI 22.76 kg/m  General: Well Developed, well nourished, and in no acute distress.   MSK: Left calcaneus slight bruise posterior lateral calcaneus.  Minimally tender to palpation normal foot and ankle motion.  Capillary refill pulses and sensation are intact distal foot.    Lab and Radiology Results No results found for this or any previous visit (from the past 72 hour(s)). DG Os Calcis Left  Result Date: 09/11/2022 CLINICAL DATA:  Left heel pain, no injury EXAM: LEFT OS CALCIS - 2+ VIEW COMPARISON:  None Available. FINDINGS: Articular space narrowing and spurring in the midfoot and Chopart joint compatible with osteoarthritis. Plantar and Achilles calcaneal spurs. Along the posterior-inferior-lateral margin the calcaneus, there are scattered somewhat amorphous but corticated calcifications surrounding a 1.9 by 1.1 by 1.3 cm irregular calcified lesion. IMPRESSION: 1. 1.9 cm in long axis irregular calcified lesion along the lateral plantar heel. Possibilities in this appearance include uremic tumoral calcinosis, exuberant calcification related to corticosteroid injection, extraskeletal osteochondroma,  dystrophic calcification, or heterotopic ossification. Heavily calcified vascular tumor such as hemangioma considered less likely differential diagnostic consideration. 2. Osteoarthritis of the hindfoot and midfoot. Plantar and Achilles calcaneal spurs. Electronically Signed   By: Van Clines M.D.   On: 09/11/2022 13:55   I, Lynne Leader, personally (independently) visualized and performed the interpretation of the images attached in this note.     Assessment and Plan: 71 y.o. female with painful calcific mass left posterior lateral calcaneus.  Etiology is unclear.  Its not clear to me how long this calcific mass has been present but it certainly looks unusual on x-ray and there is a possibility this may represent some malignancy or vascular malformation.  Plan for MRI with and without contrast to further characterize the calcific mass and for future treatment planning including injection or even surgical excision.  Recheck after MRI with myself or Dr. Tamala Julian.  Renal function last assessed about 13 months ago.  Given her risk factors for kidney disease we will go ahead and check metabolic panel today as she has upcoming MRI contrast.   PDMP not reviewed this encounter. Orders Placed This Encounter  Procedures   MR HEEL LEFT W WO CONTRAST    Standing Status:   Future    Standing Expiration Date:   09/15/2023    Order Specific Question:   If indicated for the ordered procedure, I authorize the administration of contrast media per Radiology protocol    Answer:   Yes    Order Specific Question:   What is the patient's sedation requirement?  Answer:   Anti-anxiety    Order Specific Question:   Does the patient have a pacemaker or implanted devices?    Answer:   No    Order Specific Question:   Preferred imaging location?    Answer:   [facility] (table limit-550lbs)   Comprehensive metabolic panel    Standing Status:   Future    Number of Occurrences:   1    Standing Expiration  Date:   09/15/2023   No orders of the defined types were placed in this encounter.    Discussed warning signs or symptoms. Please see discharge instructions. Patient expresses understanding.   The above documentation has been reviewed and is accurate and complete Lynne Leader, M.D.

## 2022-09-15 NOTE — Progress Notes (Signed)
Kidney function looks normal

## 2022-09-16 ENCOUNTER — Other Ambulatory Visit: Payer: Self-pay | Admitting: Interventional Cardiology

## 2022-09-17 ENCOUNTER — Other Ambulatory Visit: Payer: Self-pay | Admitting: Family Medicine

## 2022-09-17 ENCOUNTER — Ambulatory Visit
Admission: RE | Admit: 2022-09-17 | Discharge: 2022-09-17 | Disposition: A | Discharge status: Home / Self Care | Payer: Medicare Other | Source: Ambulatory Visit | Attending: Family Medicine | Admitting: Family Medicine

## 2022-09-17 DIAGNOSIS — Z1231 Encounter for screening mammogram for malignant neoplasm of breast: Secondary | ICD-10-CM

## 2022-09-30 ENCOUNTER — Ambulatory Visit (INDEPENDENT_AMBULATORY_CARE_PROVIDER_SITE_OTHER): Payer: Medicare Other | Admitting: Family Medicine

## 2022-09-30 ENCOUNTER — Other Ambulatory Visit: Payer: Self-pay | Admitting: Family Medicine

## 2022-09-30 ENCOUNTER — Encounter: Payer: Self-pay | Admitting: Family Medicine

## 2022-09-30 VITALS — BP 138/78 | HR 88 | Temp 97.7°F | Ht 66.0 in | Wt 140.4 lb

## 2022-09-30 DIAGNOSIS — E1165 Type 2 diabetes mellitus with hyperglycemia: Secondary | ICD-10-CM

## 2022-09-30 DIAGNOSIS — Z23 Encounter for immunization: Secondary | ICD-10-CM

## 2022-09-30 DIAGNOSIS — E559 Vitamin D deficiency, unspecified: Secondary | ICD-10-CM

## 2022-09-30 DIAGNOSIS — R1013 Epigastric pain: Secondary | ICD-10-CM | POA: Diagnosis not present

## 2022-09-30 DIAGNOSIS — E785 Hyperlipidemia, unspecified: Secondary | ICD-10-CM

## 2022-09-30 DIAGNOSIS — I10 Essential (primary) hypertension: Secondary | ICD-10-CM

## 2022-09-30 DIAGNOSIS — E1169 Type 2 diabetes mellitus with other specified complication: Secondary | ICD-10-CM | POA: Diagnosis not present

## 2022-09-30 LAB — LIPID PANEL
Cholesterol: 187 mg/dL (ref 0–200)
HDL: 65.3 mg/dL (ref >39.00)
LDL Cholesterol: 105 mg/dL — ABNORMAL HIGH (ref 0–99)
NonHDL: 121.54
Total CHOL/HDL Ratio: 3
Triglycerides: 81 mg/dL (ref 0.0–149.0)
VLDL: 16.2 mg/dL (ref 0.0–40.0)

## 2022-09-30 LAB — CBC WITH DIFFERENTIAL/PLATELET
Basophils Absolute: 0 10*3/uL (ref 0.0–0.1)
Basophils Relative: 0.5 % (ref 0.0–3.0)
Eosinophils Absolute: 0.1 10*3/uL (ref 0.0–0.7)
Eosinophils Relative: 1.8 % (ref 0.0–5.0)
HCT: 39.3 % (ref 36.0–46.0)
Hemoglobin: 13.1 g/dL (ref 12.0–15.0)
Lymphocytes Relative: 29.2 % (ref 12.0–46.0)
Lymphs Abs: 1.7 10*3/uL (ref 0.7–4.0)
MCHC: 33.4 g/dL (ref 30.0–36.0)
MCV: 87.7 fl (ref 78.0–100.0)
Monocytes Absolute: 0.4 10*3/uL (ref 0.1–1.0)
Monocytes Relative: 7 % (ref 3.0–12.0)
Neutro Abs: 3.6 10*3/uL (ref 1.4–7.7)
Neutrophils Relative %: 61.5 % (ref 43.0–77.0)
Platelets: 238 10*3/uL (ref 150.0–400.0)
RBC: 4.48 Mil/uL (ref 3.87–5.11)
RDW: 13.8 % (ref 11.5–15.5)
WBC: 5.8 10*3/uL (ref 4.0–10.5)

## 2022-09-30 LAB — TSH: TSH: 1.76 u[IU]/mL (ref 0.35–5.50)

## 2022-09-30 LAB — HEMOGLOBIN A1C: Hgb A1c MFr Bld: 7.3 % — ABNORMAL HIGH (ref 4.6–6.5)

## 2022-09-30 LAB — AMYLASE: Amylase: 17 U/L — ABNORMAL LOW (ref 27–131)

## 2022-09-30 LAB — MICROALBUMIN / CREATININE URINE RATIO
Creatinine,U: 128.2 mg/dL
Microalb Creat Ratio: 3.2 mg/g (ref 0.0–30.0)
Microalb, Ur: 4.1 mg/dL — ABNORMAL HIGH (ref 0.0–1.9)

## 2022-09-30 LAB — LIPASE: Lipase: 32 U/L (ref 11.0–59.0)

## 2022-09-30 LAB — VITAMIN D 25 HYDROXY (VIT D DEFICIENCY, FRACTURES): VITD: 45.63 ng/mL (ref 30.00–100.00)

## 2022-09-30 MED ORDER — LOSARTAN POTASSIUM 50 MG PO TABS: 50.0000 mg | ORAL_TABLET | Freq: Every day | ORAL | 3 refills | Status: DC

## 2022-09-30 NOTE — Progress Notes (Signed)
Labs mostly ok.  Cholesterol could be few points lower already taking atorvastatin 40 mg.  We can increase it to 80 mg, or continue to monitor. A1c is 7.3-acceptable goal for age.  Continue to monitor.  If you see it increasing, then we need to consider adding back small doses of insulin.  Will reevaluate in a few months when you come back and ready to potentially either decrease the dose of Ozempic or even getting off of it.

## 2022-09-30 NOTE — Patient Instructions (Signed)

## 2022-09-30 NOTE — Progress Notes (Signed)
Subjective:     Patient ID: Christine [name], female    DOB: 24-Feb-1951, 71 y.o.   MRN: [id number]  Chief Complaint  Patient presents with   Diabetes    Pt wants flu shot, pt did her blood pressure and got 146/84    HPI  DM type 2-ozempic 2.0.  stressed.  Sugars 190 once. Am- 102-130's.  About once/wk(after injection), pain mid epi and belching and has to take tums. Already on PPI.   Used to be on insulin and then endo weaned insulin as increased ozempic.  Lost wt.  Not wanting to lose more wt.  Has 3 month supply still of ozempic '2mg'$ .  No exercise. Now more limited by foot problems(w/u in progress)  HTN-usu 120's/80.  On losartan '50mg'$  Chronic HA-intermitt.  No changes Vit D deficiency-taking 50K units weekly for long time. [name]   HLD-on atorvastatin '40mg'$ .  No problems.  Needs refill.   Health Maintenance Due  Topic Date Due   COLONOSCOPY (Pts 45-79yr Insurance coverage will need to be confirmed)  11/02/2017   FOOT EXAM  09/23/2022    Past Medical History:  Diagnosis Date   Acute bursitis of right shoulder 08/29/2018   Right shoulder injected in August 29, 2018   Allergic rhinitis    Closed displaced fracture of fifth metatarsal bone of left foot 03/16/2017   DEPRESSIVE DISORDER NOT ELSEWHERE CLASSIFIED 03/27/2010   Qualifier: Diagnosis of  By: HLinna DarnerMD, WGwyndolyn Saxon  Mr BGalen Manila NP , [facility] [place]     Diabetes ([place] 04/02/2016   Diabetes mellitus    Essential hypertension 03/14/2008   Qualifier: Diagnosis of  By: HLinna DarnerMD, [name]     Fibromyalgia 08/12/2011   Diagnosed by Dr ZMarveen Reeks, Rheumatologist 2010    GERD (gastroesophageal reflux disease) 12/25/2016   Gluteal tendonitis of right buttock 10/19/2017   Injected in October 19, 2017 Injected October 07, 2018   Greater trochanteric bursitis of left hip 06/17/2016   Greater trochanteric bursitis of right hip 11/27/2015   Injected 11/27/2015    History of recurrent UTIs    [name]   HTN  (hypertension)    Hx of osteopenia 10/11/2012   [facility]; ordered by Dr HLinna DarnerLowest T score: -2.1 @ lumbar spine @ [facility] 11/01/12; decrease of 8.5% vs 05/2009. There is 9.2% risk over 10 yrs History fracture left elbow @ 4 Fractured left wrist , right elbow and nose post fall July/18/2013. Dr. OCaralyn GuileNo FH Osteoporosis No PMH of bisphosphonate therapy (generic Fosamax Rxed but not filled  due to polypharmacy )    Hyperlipidemia    Hyperlipidemia associated with type 2 diabetes mellitus ([place] 01/18/2007   Qualifier: Diagnosis of  By: DAllen Norris With DM LDL goal = < 100, ideally < 70. Mi in father @ 576 2 brothers @ 57& 559    Lumbar radiculopathy 04/11/2008   Qualifier: Diagnosis of  By: HLinna DarnerMD, WErick Blinkswell to epidural 01/12/2017  repeat epidural given May 2018   Migraine headache    quiescent   Nonallopathic lesion of cervical region 07/19/2014   Nonallopathic lesion of lumbosacral region 01/15/2016   Nonallopathic lesion of sacral region 06/27/2018   Nonallopathic lesion-rib cage 09/26/2014   Palpitations 04/15/2011   Rotator cuff impingement syndrome of left shoulder 05/11/2014   Seasonal allergic rhinitis 03/21/2012   Sinusitis, chronic 04/20/2016   Sleep disorder    Dr Dohmier   Subluxation of peroneal  tendon of right foot 11/10/2017   TIA (transient ischemic attack)    TRANSIENT ISCHEMIC ATTACKS, HX OF 02/02/2008   Qualifier: Diagnosis of  By: Donalee Citrin ADVRS EFF UNS RX MEDICINAL&BIOLOGICAL [place] 02/02/2008   Qualifier: Diagnosis of  By: [name] MD, Posey Pronto AND MYOSITIS 02/02/2008   Qualifier: Diagnosis of  By: [name] MD, [name]     UTI (urinary tract infection) 10/05/2014   Viral upper respiratory tract infection with cough 12/31/2014   Vitamin D deficiency 05/25/2008   Qualifier: Diagnosis of  By: [name] MD, [name]      Past Surgical History:  Procedure Laterality Date   COLONOSCOPY     Dr Carlean [name]; hemorrhoids   CORONARY  ANGIOPLASTY  12/15/1995   for chest pain- negative    KNEE ARTHROSCOPY Left 2023   POLYPECTOMY  12/14/2000   benign, hyperplastic polyp; rectal bleeding 2008   TONSILLECTOMY     TOTAL ABDOMINAL HYSTERECTOMY  12/14/1981   BSO for endometriosis   WISDOM TOOTH EXTRACTION      Outpatient Medications Prior to Visit  Medication Sig Dispense Refill   acetaminophen (TYLENOL) 500 MG tablet Take 500 mg by mouth as needed.     aspirin 81 MG tablet Take 81 mg by mouth daily.     atorvastatin (LIPITOR) 40 MG tablet Take 1 tablet (40 mg total) by mouth daily. 90 tablet 3   BD PEN NEEDLE NANO 2ND GEN 32G X 4 MM MISC USE ONCE A DAY AS DIRECTED 100 each 3   busPIRone (BUSPAR) 15 MG tablet Take 15 mg by mouth 3 (three) times daily.     cephALEXin (KEFLEX) 250 MG capsule Take 1 capsule (250 mg total) by mouth daily. 90 capsule 3   doxepin (SINEQUAN) 10 MG/ML solution      DULoxetine (CYMBALTA) 60 MG capsule Take 60 mg by mouth daily.     Lancets (ONETOUCH ULTRASOFT) lancets Check blood sugar once daily. Dx code: 250.00 100 each 12   LORazepam (ATIVAN) 0.5 MG tablet Take 0.5 mg by mouth as needed for anxiety.     nabumetone (RELAFEN) 500 MG tablet Take 1 tablet (500 mg total) by mouth daily. 30 tablet 0   omeprazole (PRILOSEC) 20 MG capsule TAKE 1 CAPSULE BY MOUTH EVERY DAY 90 capsule 3   ONETOUCH ULTRA test strip USE 1 STRIP TWICE DAILY DX CODE E11.65 100 strip 3   promethazine (PHENERGAN) 12.5 MG tablet Take 1 tablet (12.5 mg total) by mouth every 8 (eight) hours as needed for nausea or vomiting. 20 tablet 8   Semaglutide, 2 MG/DOSE, (OZEMPIC, 2 MG/DOSE,) 8 MG/3ML SOPN Inject 2 mg into the skin once a week. Gets from pt assist 9 mL 3   traZODone (DESYREL) 50 MG tablet TAKE 1/2 TO 1 TABLET BY MOUTH AT BEDTIME AS NEEDED FOR SLEEP 90 tablet 1   vitamin B-12 (CYANOCOBALAMIN) 1000 MCG tablet Take 1,000 mcg by mouth daily.     Vitamin D, Ergocalciferol, (DRISDOL) 1.25 MG (50000 UNIT) CAPS capsule TAKE 1  CAPSULE BY MOUTH ONE TIME PER WEEK 12 capsule 0   losartan (COZAAR) 50 MG tablet TAKE 1 TABLET BY MOUTH EVERY DAY 30 tablet 0   tiZANidine (ZANAFLEX) 4 MG tablet Take 1 tablet (4 mg total) by mouth at bedtime. 30 tablet 0   No facility-administered medications prior to visit.    Allergies  Allergen Reactions   Vioxx [Rofecoxib] Other (See Comments)  Excess BP which caused TIA    Prednisone Other (See Comments)    Mental status changes with high-dose oral agent. Tolerates epidural steroid injections. No associated rash or fever   Macrodantin [Nitrofurantoin Macrocrystal] Hives   Sulfa Antibiotics     Hives   Colesevelam Other (See Comments)    Leg Pain   ROS neg/noncontributory except as noted HPI/below      Objective:     BP 138/78 (BP Location: Left Arm, Patient Position: Sitting, Cuff Size: Normal)   Pulse 88   Temp 97.7 F (36.5 C) (Temporal)   Ht '5\' 6"'$  (1.676 m)   Wt 140 lb 6.4 oz (63.7 kg)   LMP  (LMP Unknown)   SpO2 100%   BMI 22.66 kg/m  Wt Readings from Last 3 Encounters:  09/30/22 140 lb 6.4 oz (63.7 kg)  09/14/22 141 lb (64 kg)  09/08/22 143 lb (64.9 kg)    Physical Exam   Gen: WDWN NAD HEENT: NCAT, conjunctiva not injected, sclera nonicteric NECK:  supple, no thyromegaly, no nodes, no carotid bruits CARDIAC: RRR, S1S2+, no murmur. DP 2+B LUNGS: CTAB. No wheezes ABDOMEN:  BS+, soft, sl tender mid epi, No HSM, no masses EXT:  no edema MSK: no gross abnormalities.  NEURO: A&O x3.  CN II-XII intact.  PSYCH: normal mood. Good eye contact     Assessment & Plan:   Problem List Items Addressed This Visit       Cardiovascular and Mediastinum   Essential hypertension   Relevant Medications   losartan (COZAAR) 50 MG tablet   Other Relevant Orders   TSH (Completed)   CBC with Differential/Platelet (Completed)   Microalbumin / creatinine urine ratio (Completed)     Endocrine   Hyperlipidemia associated with type 2 diabetes mellitus (HCC)    Relevant Medications   losartan (COZAAR) 50 MG tablet   Other Relevant Orders   TSH (Completed)   Lipid panel (Completed)   Type 2 diabetes mellitus with hyperglycemia, without long-term current use of insulin (HCC) - Primary   Relevant Medications   losartan (COZAAR) 50 MG tablet   Other Relevant Orders   Hemoglobin A1c (Completed)   TSH (Completed)   Microalbumin / creatinine urine ratio (Completed)     Other   Vitamin D deficiency   Relevant Orders   VITAMIN D 25 Hydroxy (Vit-D Deficiency, Fractures) (Completed)   Other Visit Diagnoses     Need for immunization against influenza       Relevant Orders   Flu Vaccine QUAD High Dose(Fluad) (Completed)   Epigastric pain       Relevant Orders   Lipase (Completed)   Amylase (Completed)     1.  Diabetes type 2-chronic.  Seems to be well controlled.  Occasional high.  Patient has lost a lot of weight on Ozempic.  She would prefer to be off of this or possibly lower dose.  However, she still has a lot of the 2 mg dose left.  She would like to use this up.  Recent CMP within normal limits.  Check hemoglobin A1c, TSH, microalbumin creatinine ratio.  Continue Ozempic 2 mg weekly.  Follow-up in 3 months 2.  Hypertension-chronic.  Well-controlled.  Continue losartan 50 mg daily.  This was renewed.  Check TSH, CBC, microalbumin creatinine ratio 3.  Hyperlipidemia-chronic.  Controlled on atorvastatin 40 mg.  Continue.  Check lipids 4.  Vitamin D deficiency-chronic.  Has been on 50,000 IUs/week for a long time.  Check vitamin D level  5.  Midepigastric pain-newer diagnosis.  Seems to be worse after taking her dose of Ozempic.  She is currently on omeprazole 20 mg daily.  Will check amylase/lipase as the Ozempic may cause pancreatitis. 6  foot pain-w/u in progress w/sports med.  Has MRI sch.   Meds ordered this encounter  Medications   losartan (COZAAR) 50 MG tablet    Sig: Take 1 tablet (50 mg total) by mouth daily.    Dispense:  90 tablet     Refill:  3    Please call our office to schedule an overdue appointment with Dr. Tamala Julian before anymore refills. [phone]. Thank you 1st attempt    Wellington Hampshire, MD

## 2022-10-01 ENCOUNTER — Telehealth: Payer: Self-pay | Admitting: Family Medicine

## 2022-10-01 NOTE — Telephone Encounter (Signed)
Patient was returning Zamari's call about her labs results. States she can be reached at [phone] when available.

## 2022-10-02 ENCOUNTER — Ambulatory Visit
Admission: RE | Admit: 2022-10-02 | Discharge: 2022-10-02 | Disposition: A | Discharge status: Home / Self Care | Payer: Medicare Other | Source: Ambulatory Visit | Attending: Family Medicine | Admitting: Family Medicine

## 2022-10-02 DIAGNOSIS — R2242 Localized swelling, mass and lump, left lower limb: Secondary | ICD-10-CM

## 2022-10-02 MED ORDER — GADOPICLENOL 0.5 MMOL/ML IV SOLN
7.0000 mL | Freq: Once | INTRAVENOUS | Status: AC | PRN
  Administered 2022-10-02: 7 mL via INTRAVENOUS

## 2022-10-06 NOTE — Progress Notes (Signed)
We see the calcification on the MRI very accurately.  It does not look to be due to something scary like a cancer.  We will try treating with shockwave as we already discussed over the phone and scheduled for the 30th.  You will see Dr. Tamala Julian just after seeing me

## 2022-10-08 NOTE — Progress Notes (Signed)
[place] Phone: [phone] Subjective:    I'm seeing this patient by the request  of:  [name], MD  CC: back and neck pain   [id number]  Christine [name] is a 71 y.o. female coming in with complaint of back and neck pain. OMT 09/08/2022. Injection given in R hip last visit. Seen in UC for L heel pain and then subsequently by Dr. Georgina Snell who ordered MRI. Patient had Shock wave with Dr. Georgina Snell before this appointment and was very nervous prior so she is a little tense.   Medications patient has been prescribed:   Taking:    MRI L heel 10/02/2022 IMPRESSION: 2.0 x 1.0 x 2.5 cm area of calcification in the plantar soft tissues of the heel, with minimal adjacent inflammatory change. No soft tissue component. No adjacent bony or plantar fascia involvement. This is favored to represent dystrophic calcification or tumoral calcinosis.     Reviewed prior external information including notes and imaging from previsou exam, outside providers and external EMR if available.   As well as notes that were available from care everywhere and other healthcare systems.  Past medical history, social, surgical and family history all reviewed in electronic medical record.  No pertanent information unless stated regarding to the chief complaint.   Past Medical History:  Diagnosis Date   Acute bursitis of right shoulder 08/29/2018   Right shoulder injected in August 29, 2018   Allergic rhinitis    Closed displaced fracture of fifth metatarsal bone of left foot 03/16/2017   DEPRESSIVE DISORDER NOT ELSEWHERE CLASSIFIED 03/27/2010   Qualifier: Diagnosis of  By: [name] MD, [name]   [name], NP , [facility] [place]     Diabetes ([place]) 04/02/2016   Diabetes mellitus    Essential hypertension 03/14/2008   Qualifier: Diagnosis of  By: [name] MD, [name]     Fibromyalgia 08/12/2011   Diagnosed by Dr  Marveen Reeks , Rheumatologist 2010    GERD (gastroesophageal reflux disease) 12/25/2016   Gluteal tendonitis of right buttock 10/19/2017   Injected in October 19, 2017 Injected October 07, 2018   Greater trochanteric bursitis of left hip 06/17/2016   Greater trochanteric bursitis of right hip 11/27/2015   Injected 11/27/2015    History of recurrent UTIs    [name]   HTN (hypertension)    Hx of osteopenia 10/11/2012   [facility]; ordered by [name] Lowest T score: -2.1 @ lumbar spine @ [facility] 11/01/12; decrease of 8.5% vs 05/2009. There is 9.2% risk over 10 yrs History fracture left elbow @ 4 Fractured left wrist , right elbow and nose post fall July/18/2013. Dr. [name] No FH Osteoporosis No PMH of bisphosphonate therapy (generic Fosamax Rxed but not filled  due to polypharmacy )    Hyperlipidemia    Hyperlipidemia associated with type 2 diabetes mellitus ([place]) 01/18/2007   Qualifier: Diagnosis of  By: [name]  With DM LDL goal = < 100, ideally < 70. Mi in father @ 24; 2 brothers @ 71 & 81     Lumbar radiculopathy 04/11/2008   Qualifier: Diagnosis of  By: [name] MD, Erick Blinks well to epidural 01/12/2017  repeat epidural given May 2018   Migraine headache    quiescent   Nonallopathic lesion of cervical region 07/19/2014   Nonallopathic lesion of lumbosacral region 01/15/2016   Nonallopathic lesion of sacral region 06/27/2018   Nonallopathic lesion-rib  cage 09/26/2014   Palpitations 04/15/2011   Rotator cuff impingement syndrome of left shoulder 05/11/2014   Seasonal allergic rhinitis 03/21/2012   Sinusitis, chronic 04/20/2016   Sleep disorder    Dr Dohmier   Subluxation of peroneal tendon of right foot 11/10/2017   TIA (transient ischemic attack)    [place], HX OF 02/02/2008   Qualifier: Diagnosis of  By: Donalee Citrin ADVRS EFF UNS RX MEDICINAL&BIOLOGICAL [place] 02/02/2008   Qualifier: Diagnosis of  By: [name] MD, Posey Pronto AND  MYOSITIS 02/02/2008   Qualifier: Diagnosis of  By: [name] MD, [name]     UTI (urinary tract infection) 10/05/2014   Viral upper respiratory tract infection with cough 12/31/2014   Vitamin D deficiency 05/25/2008   Qualifier: Diagnosis of  By: [name] MD, [name]      Allergies  Allergen Reactions   Vioxx [Rofecoxib] Other (See Comments)    Excess BP which caused TIA    Prednisone Other (See Comments)    Mental status changes with high-dose oral agent. Tolerates epidural steroid injections. No associated rash or fever   Macrodantin [Nitrofurantoin Macrocrystal] Hives   Sulfa Antibiotics     Hives   Colesevelam Other (See Comments)    Leg Pain     Review of Systems:  No  visual changes, nausea, vomiting, diarrhea, constipation, dizziness, abdominal pain, skin rash, fevers, chills, night sweats, weight loss, swollen lymph nodes, body aches, joint swelling, chest pain, shortness of breath, mood changes. POSITIVE muscle aches, headache  Objective  Blood pressure 132/82, pulse (!) 101, height '5\' 6"'$  (1.676 m), SpO2 98 %.   General: No apparent distress alert and oriented x3 mood and affect normal, dressed appropriately.  HEENT: Pupils equal, extraocular movements intact  Respiratory: Patient's speak in full sentences and does not appear short of breath  Cardiovascular: No lower extremity edema, non tender, no erythema  Gait antalgic MSK:  Back loss of lordosis.  Patient does have more tightness on the left side though seems to be compensating for her gait.  Neck still has some loss of lordosis.  Some tenderness to palpation in the paraspinal musculature.  Osteopathic findings  C3 flexed rotated and side bent right C5 flexed rotated and side bent left T3 extended rotated and side bent right inhaled rib T7 extended rotated and side bent left L2 flexed rotated and side bent right Sacrum right on right       Assessment and Plan:  No problem-specific Assessment & Plan notes found  for this encounter.    Nonallopathic problems  Decision today to treat with OMT was based on Physical Exam  After verbal consent patient was treated with HVLA, ME, FPR techniques in cervical, rib, thoracic, lumbar, and sacral  areas  Patient tolerated the procedure well with improvement in symptoms  Patient given exercises, stretches and lifestyle modifications  See medications in patient instructions if given  Patient will follow up in 4-8 weeks    The above documentation has been reviewed and is accurate and complete Lyndal Pulley, DO          Note: This dictation was prepared with Dragon dictation along with smaller phrase technology. Any transcriptional errors that result from this process are unintentional.

## 2022-10-12 ENCOUNTER — Ambulatory Visit (INDEPENDENT_AMBULATORY_CARE_PROVIDER_SITE_OTHER): Payer: Self-pay | Admitting: Family Medicine

## 2022-10-12 ENCOUNTER — Ambulatory Visit (INDEPENDENT_AMBULATORY_CARE_PROVIDER_SITE_OTHER): Payer: Medicare Other | Admitting: Family Medicine

## 2022-10-12 ENCOUNTER — Encounter: Payer: Self-pay | Admitting: Family Medicine

## 2022-10-12 VITALS — BP 132/82 | HR 101 | Ht 66.0 in

## 2022-10-12 DIAGNOSIS — M9903 Segmental and somatic dysfunction of lumbar region: Secondary | ICD-10-CM | POA: Diagnosis not present

## 2022-10-12 DIAGNOSIS — M5010 Cervical disc disorder with radiculopathy, unspecified cervical region: Secondary | ICD-10-CM | POA: Diagnosis not present

## 2022-10-12 DIAGNOSIS — M9908 Segmental and somatic dysfunction of rib cage: Secondary | ICD-10-CM | POA: Diagnosis not present

## 2022-10-12 DIAGNOSIS — M9901 Segmental and somatic dysfunction of cervical region: Secondary | ICD-10-CM

## 2022-10-12 DIAGNOSIS — M7989 Other specified soft tissue disorders: Secondary | ICD-10-CM

## 2022-10-12 DIAGNOSIS — M9902 Segmental and somatic dysfunction of thoracic region: Secondary | ICD-10-CM

## 2022-10-12 DIAGNOSIS — M9904 Segmental and somatic dysfunction of sacral region: Secondary | ICD-10-CM

## 2022-10-12 NOTE — Assessment & Plan Note (Signed)
Known arthritic changes.  Some increasing stress still with her husband undergoing chemotherapy for his bladder cancer.  Discussed with patient about icing regimen and home exercises.  Continuing on the Cymbalta 60 mg daily.  Has nabumetone 500 mg daily. Does take trazodone half pill at night which is 25 mg as well.  Follow-up with me again weeks.  Zanaflex does seem to help with the headaches more.

## 2022-10-12 NOTE — Patient Instructions (Addendum)
Good to see you Schedule shockwave treatments with Dr. Georgina Snell next week Thanks for the [place] tip See me again in 6 weeks

## 2022-10-12 NOTE — Progress Notes (Signed)
   [name] [place] Phone: [phone]   Extracorporeal Shockwave Therapy Note    Patient is being treated today with ECSWT. Informed consent was obtained and patient tolerated procedure well.   Therapy performed by Lynne Leader  Condition treated: Calcific mass left calcaneus Treatment preset used: Planter fasciitis Energy used: 90 mJ Frequency used: 10 Hz Number of pulses: 2500 Treatment #1 of #4  Electronically signed by:  [facility]ine [name] [facility] 10:41 AM 10/12/22

## 2022-10-12 NOTE — Patient Instructions (Signed)
Return in 1 week for shockwave treatment #2

## 2022-10-20 ENCOUNTER — Ambulatory Visit (INDEPENDENT_AMBULATORY_CARE_PROVIDER_SITE_OTHER): Payer: Self-pay | Admitting: Family Medicine

## 2022-10-20 DIAGNOSIS — M7989 Other specified soft tissue disorders: Secondary | ICD-10-CM

## 2022-10-20 NOTE — Progress Notes (Signed)
   [name] [place] Phone: [phone]   Extracorporeal Shockwave Therapy Note    Patient is being treated today with ECSWT. Informed consent was obtained and patient tolerated procedure well.   Therapy performed by Lynne Leader  Condition treated: Calcific mass left calcaneus Treatment preset used: Planter fasciitis Energy used: 90 mJ Frequency used: 10 Hz Number of pulses: 2500 Treatment #2 of #4  Electronically signed by:  Christine [name] [facility] 11:11 AM 10/20/22

## 2022-10-27 ENCOUNTER — Emergency Department (HOSPITAL_BASED_OUTPATIENT_CLINIC_OR_DEPARTMENT_OTHER)
Admission: EM | Admit: 2022-10-27 | Discharge: 2022-10-27 | Disposition: A | Discharge status: Home / Self Care | Payer: Medicare Other | Attending: Emergency Medicine | Admitting: Emergency Medicine

## 2022-10-27 ENCOUNTER — Ambulatory Visit: Payer: Self-pay | Admitting: Family Medicine

## 2022-10-27 ENCOUNTER — Emergency Department (HOSPITAL_BASED_OUTPATIENT_CLINIC_OR_DEPARTMENT_OTHER): Payer: Medicare Other

## 2022-10-27 ENCOUNTER — Other Ambulatory Visit: Payer: Self-pay

## 2022-10-27 ENCOUNTER — Encounter (HOSPITAL_BASED_OUTPATIENT_CLINIC_OR_DEPARTMENT_OTHER): Payer: Self-pay | Admitting: Emergency Medicine

## 2022-10-27 DIAGNOSIS — R42 Dizziness and giddiness: Secondary | ICD-10-CM | POA: Diagnosis not present

## 2022-10-27 DIAGNOSIS — Y92000 Kitchen of unspecified non-institutional (private) residence as  the place of occurrence of the external cause: Secondary | ICD-10-CM | POA: Insufficient documentation

## 2022-10-27 DIAGNOSIS — W01198A Fall on same level from slipping, tripping and stumbling with subsequent striking against other object, initial encounter: Secondary | ICD-10-CM | POA: Insufficient documentation

## 2022-10-27 DIAGNOSIS — Z794 Long term (current) use of insulin: Secondary | ICD-10-CM | POA: Diagnosis not present

## 2022-10-27 DIAGNOSIS — M542 Cervicalgia: Secondary | ICD-10-CM | POA: Insufficient documentation

## 2022-10-27 DIAGNOSIS — W19XXXA Unspecified fall, initial encounter: Secondary | ICD-10-CM

## 2022-10-27 DIAGNOSIS — S022XXA Fracture of nasal bones, initial encounter for closed fracture: Secondary | ICD-10-CM | POA: Diagnosis not present

## 2022-10-27 DIAGNOSIS — M47812 Spondylosis without myelopathy or radiculopathy, cervical region: Secondary | ICD-10-CM | POA: Diagnosis not present

## 2022-10-27 DIAGNOSIS — S0083XA Contusion of other part of head, initial encounter: Secondary | ICD-10-CM | POA: Diagnosis not present

## 2022-10-27 DIAGNOSIS — S0990XA Unspecified injury of head, initial encounter: Secondary | ICD-10-CM

## 2022-10-27 DIAGNOSIS — R9431 Abnormal electrocardiogram [ECG] [EKG]: Secondary | ICD-10-CM | POA: Diagnosis not present

## 2022-10-27 DIAGNOSIS — Z7982 Long term (current) use of aspirin: Secondary | ICD-10-CM | POA: Diagnosis not present

## 2022-10-27 DIAGNOSIS — M7989 Other specified soft tissue disorders: Secondary | ICD-10-CM

## 2022-10-27 DIAGNOSIS — R519 Headache, unspecified: Secondary | ICD-10-CM | POA: Diagnosis not present

## 2022-10-27 NOTE — ED Triage Notes (Signed)
Pt reports fall today after becoming dizzy. States she face planted. C/o nose pain and head pain. Denies loc, blood thinners. Pt states she still currently feels dizzy.

## 2022-10-27 NOTE — Discharge Instructions (Signed)
CT head face and neck without any acute injuries.  You do have some pre-existing degenerative changes to the neck bones.  Follow-up with your primary care doctor as needed.  Return for any new or worse symptoms.

## 2022-10-27 NOTE — ED Provider Notes (Signed)
[facility] EMERGENCY DEPARTMENT Provider Note   CSN: [id number] Arrival date & time: 10/27/22  1445     History  Chief Complaint  Patient presents with   Fall   Facial Injury   Dizziness    Arvis LORRENE GRAEF is a 71 y.o. female.  Patient with a fall in the kitchen today.  Became dizzy right prior to the fall.  She has been having difficulties with that for over a year has had extensive work-up regarding the dizziness without any specific findings.  I think they could be medication related.  Patient feels fine now.  When she fell she landed on her forehead and face.  Heard a pop in her nose.  Worried about nose fracture she had a nose fracture in the past.  Also with complaint of some neck pain on the left side.  No extremity pain upper or lower no upper back or lower back pain.  No chest pain no abdominal pain.       Home Medications Prior to Admission medications   Medication Sig Start Date End Date Taking? Authorizing Provider  acetaminophen (TYLENOL) 500 MG tablet Take 500 mg by mouth as needed.    [provider]  aspirin 81 MG tablet Take 81 mg by mouth daily.    [provider]  atorvastatin (LIPITOR) 40 MG tablet Take 1 tablet (40 mg total) by mouth daily. 07/01/22   [name], MD  BD PEN NEEDLE NANO 2ND GEN 32G X 4 MM MISC USE ONCE A DAY AS DIRECTED 07/15/21   Renato Shin, MD  busPIRone (BUSPAR) 15 MG tablet Take 15 mg by mouth 3 (three) times daily. 07/16/21   [provider]  cephALEXin (KEFLEX) 250 MG capsule Take 1 capsule (250 mg total) by mouth daily. Patient not taking: Reported on 10/12/2022 07/01/22   [name], MD  doxepin [facility]) 10 MG/ML solution  06/04/22   [provider]  DULoxetine (CYMBALTA) 60 MG capsule Take 60 mg by mouth daily.    [provider]  Lancets [facility] ULTRASOFT) lancets Check blood sugar once daily. Dx code: 250.00 11/08/13   Hendricks Limes, MD  LORazepam (ATIVAN)  0.5 MG tablet Take 0.5 mg by mouth as needed for anxiety.    [provider]  losartan (COZAAR) 50 MG tablet Take 1 tablet (50 mg total) by mouth daily. 09/30/22   [name], MD  nabumetone (RELAFEN) 500 MG tablet Take 1 tablet (500 mg total) by mouth daily. 09/08/22   Lyndal Pulley, DO  omeprazole (PRILOSEC) 20 MG capsule TAKE 1 CAPSULE BY MOUTH EVERY DAY 07/01/22   [name], MD  [facility] ULTRA test strip USE 1 STRIP TWICE DAILY DX CODE E11.65 03/09/22   Renato Shin, MD  promethazine (PHENERGAN) 12.5 MG tablet Take 1 tablet (12.5 mg total) by mouth every 8 (eight) hours as needed for nausea or vomiting. 04/21/21   [name], MD  Semaglutide, 2 MG/DOSE, (OZEMPIC, 2 MG/DOSE,) 8 MG/3ML SOPN Inject 2 mg into the skin once a week. Gets from pt assist 12/18/21   Renato Shin, MD  tiZANidine (ZANAFLEX) 4 MG tablet TAKE 1 TABLET BY MOUTH AT BEDTIME. 09/30/22   Lyndal Pulley, DO  traZODone (DESYREL) 50 MG tablet TAKE 1/2 TO 1 TABLET BY MOUTH AT BEDTIME AS NEEDED FOR SLEEP 07/06/22   [name], MD  vitamin B-12 (CYANOCOBALAMIN) 1000 MCG tablet Take 1,000 mcg by mouth daily.  [provider]  Vitamin D, Ergocalciferol, (DRISDOL) 1.25 MG (50000 UNIT) CAPS capsule TAKE 1 CAPSULE BY MOUTH ONE TIME PER WEEK 09/17/22   Lyndal Pulley, DO      Allergies    Vioxx [rofecoxib], Prednisone, Macrodantin [nitrofurantoin macrocrystal], Sulfa antibiotics, and Colesevelam    Review of Systems   Review of Systems  Constitutional:  Negative for chills and fever.  HENT:  Negative for rhinorrhea and sore throat.   Eyes:  Negative for visual disturbance.  Respiratory:  Negative for cough and shortness of breath.   Cardiovascular:  Negative for chest pain and leg swelling.  Gastrointestinal:  Negative for abdominal pain, diarrhea, nausea and vomiting.  Genitourinary:  Negative for dysuria.  Musculoskeletal:  Positive for neck pain. Negative for back pain.  Skin:   Negative for rash.  Neurological:  Positive for dizziness and headaches. Negative for light-headedness.  Hematological:  Does not bruise/bleed easily.  Psychiatric/Behavioral:  Negative for confusion.     Physical Exam Updated Vital Signs BP (!) 101/56 (BP Location: Left Arm)   Pulse 75   Temp 98.6 F (37 C)   Resp 17   Ht 1.676 m ('5\' 6"'$ )   Wt 63.5 kg   LMP  (LMP Unknown)   SpO2 100%   BMI 22.60 kg/m  Physical Exam Vitals and nursing note reviewed.  Constitutional:      General: She is not in acute distress.    Appearance: Normal appearance. She is well-developed.  HENT:     Head: Normocephalic.     Comments: Patient with bruise to the middle part of the forehead measuring about 4 cm.  No obvious nasal deformity.  No swelling.  Nares clear.    Mouth/Throat:     Mouth: Mucous membranes are moist.  Eyes:     Extraocular Movements: Extraocular movements intact.     Conjunctiva/sclera: Conjunctivae normal.     Pupils: Pupils are equal, round, and reactive to light.  Cardiovascular:     Rate and Rhythm: Normal rate and regular rhythm.     Heart sounds: No murmur heard. Pulmonary:     Effort: Pulmonary effort is normal. No respiratory distress.     Breath sounds: Normal breath sounds.  Abdominal:     Palpations: Abdomen is soft.     Tenderness: There is no abdominal tenderness.  Musculoskeletal:        General: No swelling, tenderness or signs of injury.     Cervical back: Normal range of motion and neck supple. No tenderness.     Comments: No extremity tenderness.  No upper or lower back tenderness.  Skin:    General: Skin is warm and dry.     Capillary Refill: Capillary refill takes less than 2 seconds.  Neurological:     General: No focal deficit present.     Mental Status: She is alert and oriented to person, place, and time.     Cranial Nerves: No cranial nerve deficit.     Sensory: No sensory deficit.     Motor: No weakness.  Psychiatric:        Mood and  Affect: Mood normal.     ED Results / Procedures / Treatments   Labs (all labs ordered are listed, but only abnormal results are displayed) Labs Reviewed - No data to display  EKG None  Radiology CT Head Wo Contrast  Result Date: 10/27/2022 CLINICAL DATA:  Dizziness, fell, nasal and head pain EXAM: CT HEAD WITHOUT CONTRAST CT MAXILLOFACIAL WITHOUT  CONTRAST CT CERVICAL SPINE WITHOUT CONTRAST TECHNIQUE: Multidetector CT imaging of the head, cervical spine, and maxillofacial structures were performed using the standard protocol without intravenous contrast. Multiplanar CT image reconstructions of the cervical spine and maxillofacial structures were also generated. RADIATION DOSE REDUCTION: This exam was performed according to the departmental dose-optimization program which includes automated exposure control, adjustment of the mA and/or kV according to patient size and/or use of iterative reconstruction technique. COMPARISON:  08/26/2021 FINDINGS: CT HEAD FINDINGS Brain: No acute infarct or hemorrhage. Lateral ventricles and midline structures are unremarkable. No acute extra-axial fluid collections. No mass effect. Vascular: Atherosclerosis within the internal carotid arteries. No hyperdense vessel. Skull: Normal. Negative for fracture or focal lesion. Other: None. CT MAXILLOFACIAL FINDINGS Osseous: Chronic healed nasal bone fracture unchanged since prior exam. No acute displaced fractures. Mandible is unremarkable. Orbits: Negative. No traumatic or inflammatory finding. Sinuses: Clear. Soft tissues: Negative. CT CERVICAL SPINE FINDINGS Alignment: Alignment is anatomic. Skull base and vertebrae: No acute fracture. No primary bone lesion or focal pathologic process. Soft tissues and spinal canal: No prevertebral fluid or swelling. No visible canal hematoma. Disc levels: Hypertrophic changes at the C1-C2 interface. There is moderate spondylosis at C5-C6 and C6-C7. Prominent facet hypertrophic changes  are seen at C4-C5. Symmetrical neural foraminal encroachment at C5-C6 with right predominant neural foraminal narrowing at C4-C5 and C6-C7. Upper chest: Airway is patent.  Lung apices are clear. Other: Reconstructed images demonstrate no additional findings. IMPRESSION: 1. No acute intracranial process. 2. No acute facial bone fractures. 3. No acute cervical spine fracture. 4. Multilevel cervical spondylosis and facet hypertrophy as above. Electronically Signed   By: Randa Ngo M.D.   On: 10/27/2022 15:30   CT Maxillofacial Wo Contrast  Result Date: 10/27/2022 CLINICAL DATA:  Dizziness, fell, nasal and head pain EXAM: CT HEAD WITHOUT CONTRAST CT MAXILLOFACIAL WITHOUT CONTRAST CT CERVICAL SPINE WITHOUT CONTRAST TECHNIQUE: Multidetector CT imaging of the head, cervical spine, and maxillofacial structures were performed using the standard protocol without intravenous contrast. Multiplanar CT image reconstructions of the cervical spine and maxillofacial structures were also generated. RADIATION DOSE REDUCTION: This exam was performed according to the departmental dose-optimization program which includes automated exposure control, adjustment of the mA and/or kV according to patient size and/or use of iterative reconstruction technique. COMPARISON:  08/26/2021 FINDINGS: CT HEAD FINDINGS Brain: No acute infarct or hemorrhage. Lateral ventricles and midline structures are unremarkable. No acute extra-axial fluid collections. No mass effect. Vascular: Atherosclerosis within the internal carotid arteries. No hyperdense vessel. Skull: Normal. Negative for fracture or focal lesion. Other: None. CT MAXILLOFACIAL FINDINGS Osseous: Chronic healed nasal bone fracture unchanged since prior exam. No acute displaced fractures. Mandible is unremarkable. Orbits: Negative. No traumatic or inflammatory finding. Sinuses: Clear. Soft tissues: Negative. CT CERVICAL SPINE FINDINGS Alignment: Alignment is anatomic. Skull base and  vertebrae: No acute fracture. No primary bone lesion or focal pathologic process. Soft tissues and spinal canal: No prevertebral fluid or swelling. No visible canal hematoma. Disc levels: Hypertrophic changes at the C1-C2 interface. There is moderate spondylosis at C5-C6 and C6-C7. Prominent facet hypertrophic changes are seen at C4-C5. Symmetrical neural foraminal encroachment at C5-C6 with right predominant neural foraminal narrowing at C4-C5 and C6-C7. Upper chest: Airway is patent.  Lung apices are clear. Other: Reconstructed images demonstrate no additional findings. IMPRESSION: 1. No acute intracranial process. 2. No acute facial bone fractures. 3. No acute cervical spine fracture. 4. Multilevel cervical spondylosis and facet hypertrophy as above. Electronically Signed   By:  Randa Ngo M.D.   On: 10/27/2022 15:30   CT Cervical Spine Wo Contrast  Result Date: 10/27/2022 CLINICAL DATA:  Dizziness, fell, nasal and head pain EXAM: CT HEAD WITHOUT CONTRAST CT MAXILLOFACIAL WITHOUT CONTRAST CT CERVICAL SPINE WITHOUT CONTRAST TECHNIQUE: Multidetector CT imaging of the head, cervical spine, and maxillofacial structures were performed using the standard protocol without intravenous contrast. Multiplanar CT image reconstructions of the cervical spine and maxillofacial structures were also generated. RADIATION DOSE REDUCTION: This exam was performed according to the departmental dose-optimization program which includes automated exposure control, adjustment of the mA and/or kV according to patient size and/or use of iterative reconstruction technique. COMPARISON:  08/26/2021 FINDINGS: CT HEAD FINDINGS Brain: No acute infarct or hemorrhage. Lateral ventricles and midline structures are unremarkable. No acute extra-axial fluid collections. No mass effect. Vascular: Atherosclerosis within the internal carotid arteries. No hyperdense vessel. Skull: Normal. Negative for fracture or focal lesion. Other: None. CT  MAXILLOFACIAL FINDINGS Osseous: Chronic healed nasal bone fracture unchanged since prior exam. No acute displaced fractures. Mandible is unremarkable. Orbits: Negative. No traumatic or inflammatory finding. Sinuses: Clear. Soft tissues: Negative. CT CERVICAL SPINE FINDINGS Alignment: Alignment is anatomic. Skull base and vertebrae: No acute fracture. No primary bone lesion or focal pathologic process. Soft tissues and spinal canal: No prevertebral fluid or swelling. No visible canal hematoma. Disc levels: Hypertrophic changes at the C1-C2 interface. There is moderate spondylosis at C5-C6 and C6-C7. Prominent facet hypertrophic changes are seen at C4-C5. Symmetrical neural foraminal encroachment at C5-C6 with right predominant neural foraminal narrowing at C4-C5 and C6-C7. Upper chest: Airway is patent.  Lung apices are clear. Other: Reconstructed images demonstrate no additional findings. IMPRESSION: 1. No acute intracranial process. 2. No acute facial bone fractures. 3. No acute cervical spine fracture. 4. Multilevel cervical spondylosis and facet hypertrophy as above. Electronically Signed   By: Randa Ngo M.D.   On: 10/27/2022 15:30    Procedures Procedures    Medications Ordered in ED Medications - No data to display  ED Course/ Medical Decision Making/ A&P                           Medical Decision Making Amount and/or Complexity of Data Reviewed Radiology: ordered.   CT head neck and face without any acute injuries.  Evidence of an old nasal bone fracture.  Also no evidence of any sinus infection.  Patient does have degenerative changes to the cervical spine but no acute injury.  From the fall standpoint patient stable for discharge home.  Patient states that she has had the dizziness and the near syncopal stuff ongoing for over a year has already had an extensive work-up for that.   EKG without any acute arrhythmia or acute changes.   Final Clinical Impression(s) / ED  Diagnoses Final diagnoses:  Fall, initial encounter  Injury of head, initial encounter  Contusion of face, initial encounter    Rx / DC Orders ED Discharge Orders     None         Fredia Sorrow, MD 10/27/22 1546

## 2022-10-27 NOTE — Progress Notes (Signed)
   [name] Phone: [phone]   Extracorporeal Shockwave Therapy Note    Patient is being treated today with ECSWT. Informed consent was obtained and patient tolerated procedure well.   Therapy performed by Lynne Leader  Condition treated: Calcific mass left calcaneus Treatment preset used: Planter fasciitis Energy used: 90 mJ Frequency used: 10 Hz Number of pulses: 2500 Treatment #3 of #4  Electronically signed by:  [name] 10:21 AM 10/27/22

## 2022-10-30 IMAGING — MR MR HEAD W/O CM
10 series · 48 of 48 positions shown · non-contrast
Comparison: Prior brain MRI 02/13/2020.

CLINICAL DATA: Dizziness R42 ([id number]). Dizziness, non-specific.
Additional history provided by scanning technologist: Patient
reports dizziness, weakness, symptoms for 5 months.

EXAM:
MRI HEAD WITHOUT CONTRAST
TECHNIQUE: Multiplanar, multiecho pulse sequences of the brain and surrounding
structures were obtained without intravenous contrast.

[Series 2: T1 · sagittal · 5.0mm · 0.45mm/px · 3 of 25 slices shown]
[im 1/25]
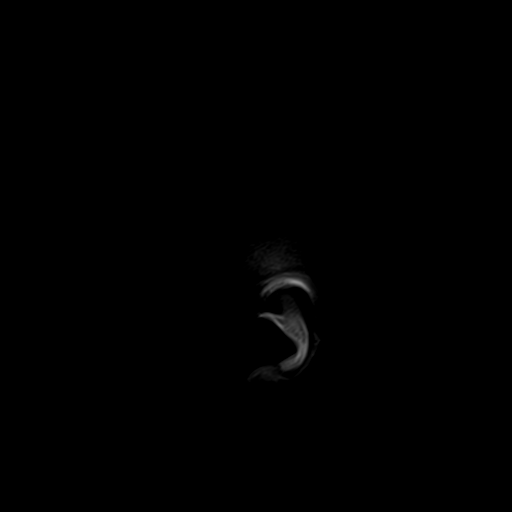
[im 13/25]
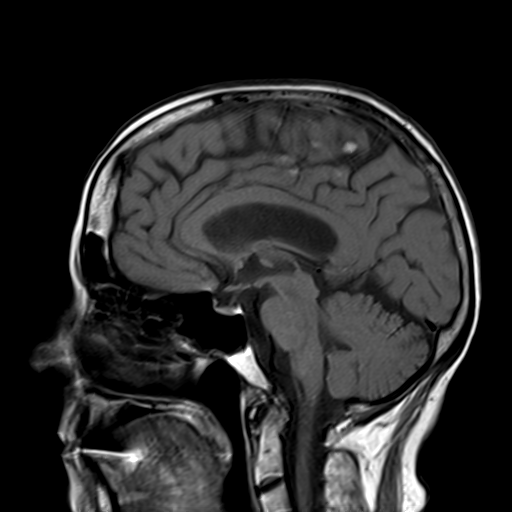
[im 25/25]
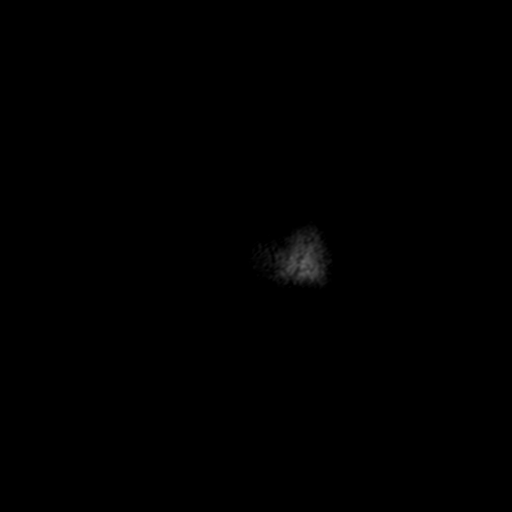

[Series 3: ax ep2d_diff_3 · axial · 3.0mm · 1.80mm/px · z∈[-79,+82]mm · 9 of 105 slices shown]
[im 1/105]
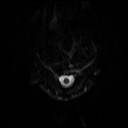
[im 14/105]
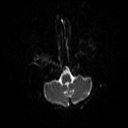
[im 27/105]
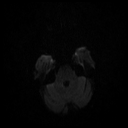
[im 40/105]
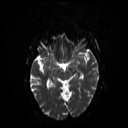
[im 53/105]
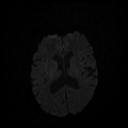
[im 66/105]
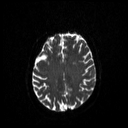
[im 79/105]
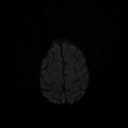
[im 92/105]
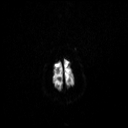
[im 105/105]
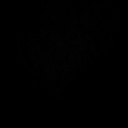

[Series 4: ax ep2d_diff_3_adc · axial · 3.0mm · 1.80mm/px · z∈[-79,+82]mm · 4 of 54 slices shown]
[im 1/54]
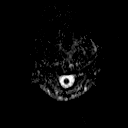
[im 18/54]
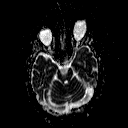
[im 36/54]
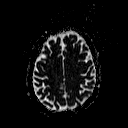
[im 54/54]
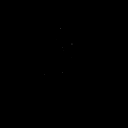

[Series 5: cor ep2d_diff · coronal · 5.0mm · 1.77mm/px · 4 of 57 slices shown]
[im 1/57]
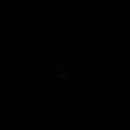
[im 19/57]
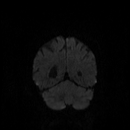
[im 38/57]
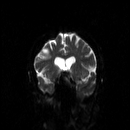
[im 57/57]
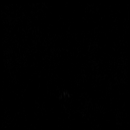

[Series 6: cor ep2d_diff_adc · coronal · 5.0mm · 1.77mm/px · 2 of 29 slices shown]
[im 1/29]
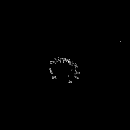
[im 29/29]
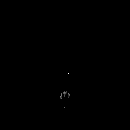

[Series 8: swi_images · axial · 2.0mm · 0.98mm/px · z∈[-76,+81]mm · 6 of 80 slices shown]
[im 1/80]
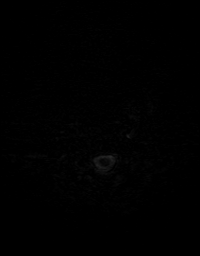
[im 16/80]
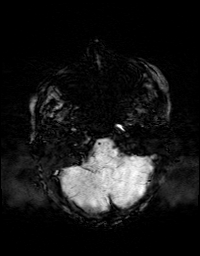
[im 32/80]
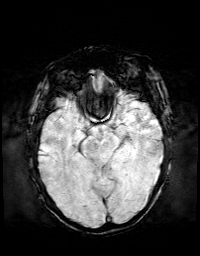
[im 48/80]
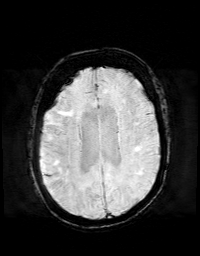
[im 64/80]
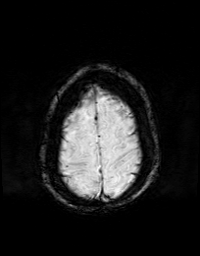
[im 80/80]
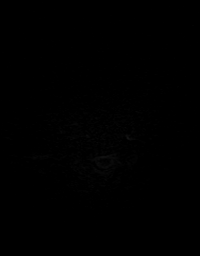

[Series 9: FLAIR · axial · 3.0mm · 0.43mm/px · z∈[-74,+77]mm · 3 of 40 slices shown]
[im 1/40]
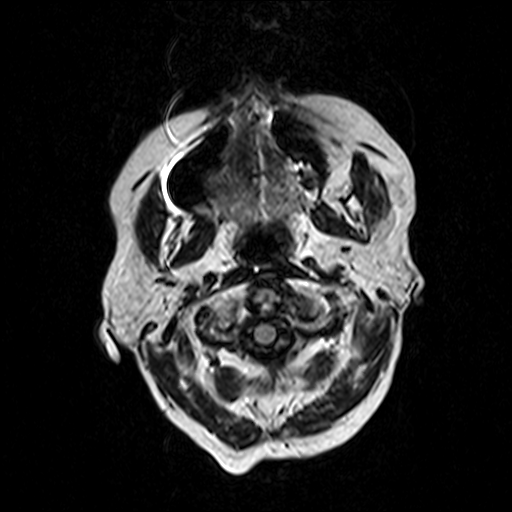
[im 20/40]
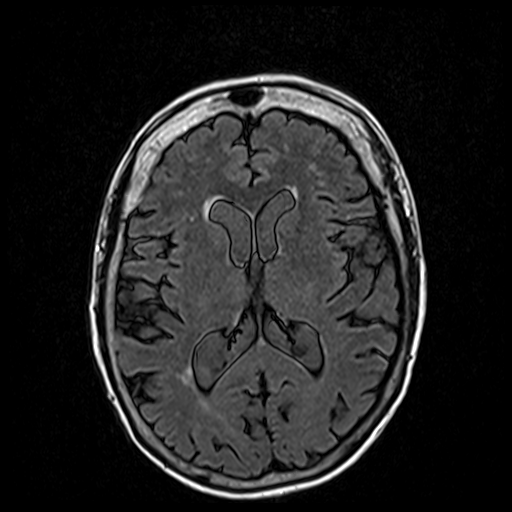
[im 40/40]
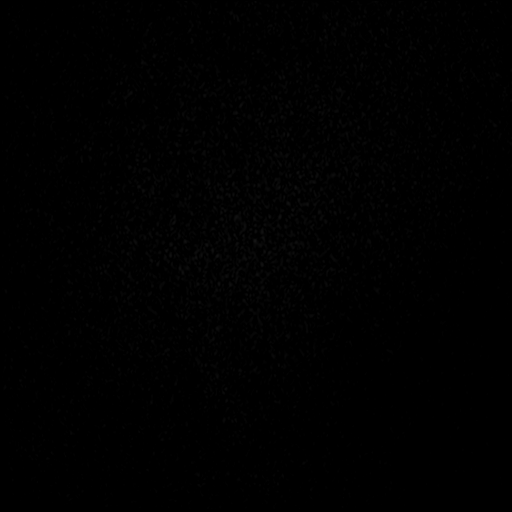

[Series 10: T2 · axial · 5.0mm · 0.65mm/px · z∈[-81,+86]mm · 2 of 29 slices shown (1 of 2)]
[im 1/29]
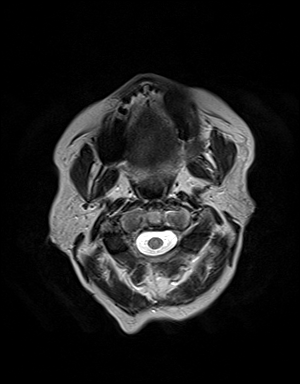
[im 29/29]
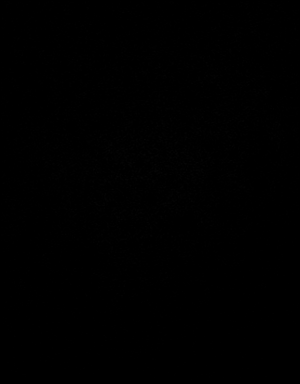

[Series 11: t1_mpr_tra · axial · 1.0mm · 0.72mm/px · z∈[-77,+81]mm · 13 of 160 slices shown]
[im 1/160]
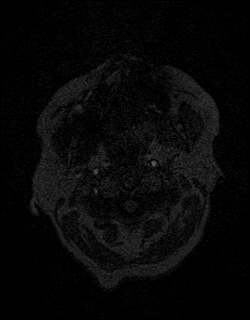
[im 14/160]
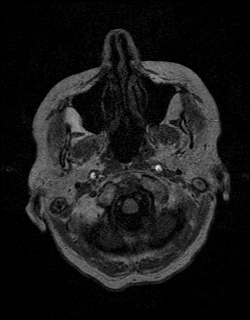
[im 27/160]
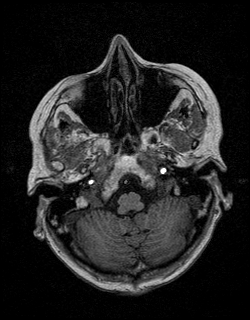
[im 40/160]
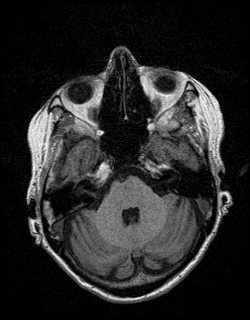
[im 54/160]
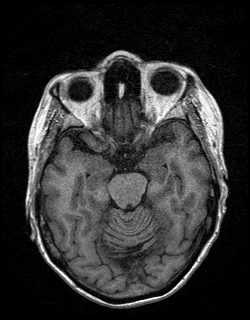
[im 67/160]
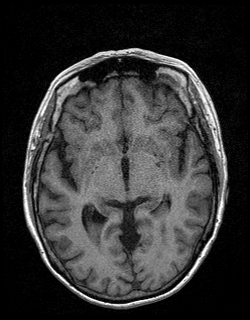
[im 80/160]
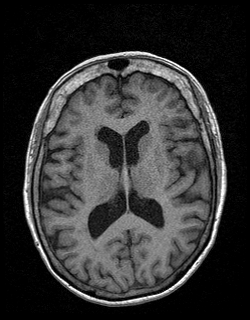
[im 93/160]
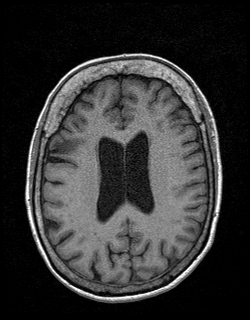
[im 107/160]
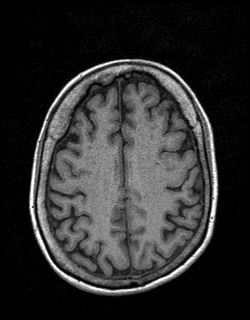
[im 120/160]
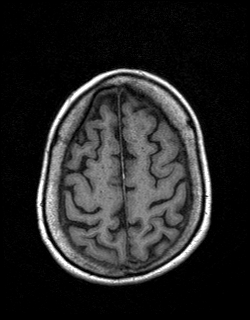
[im 133/160]
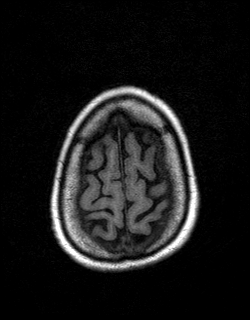
[im 146/160]
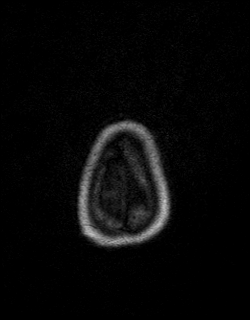
[im 160/160]
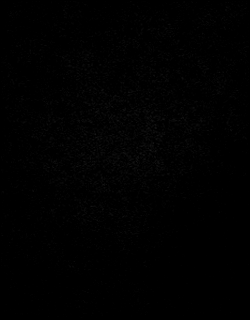

[Series 12: T2 · coronal · 5.0mm · 0.45mm/px · 2 of 31 slices shown (2 of 2)]
[im 1/31]
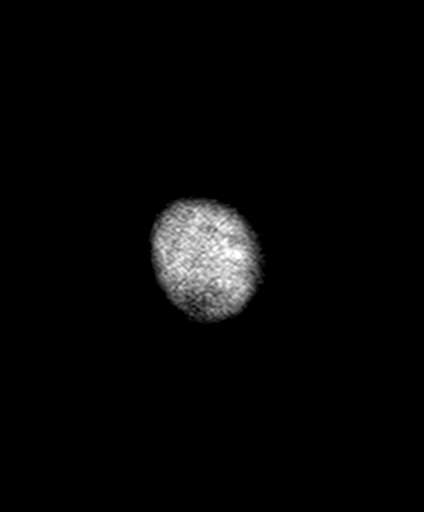
[im 31/31]
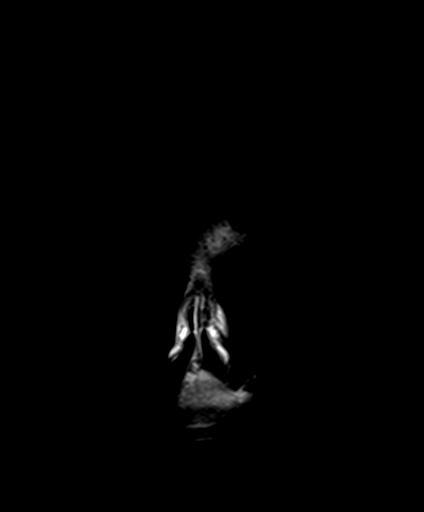

[48 of 48 positions shown; findings below may reference images not displayed]

FINDINGS: Brain:

Mild generalized cerebral and cerebellar atrophy.

Redemonstrated small chronic cortically based infarct within the
lateral right frontal lobe (MCA vascular territory).

Mild multifocal T2/FLAIR hyperintensity within the cerebral white
matter, nonspecific but compatible with chronic small vessel
ischemic disease.

There is no acute infarct.

No evidence of an intracranial mass.

No chronic intracranial blood products.

No extra-axial fluid collection.

No midline shift.

Vascular: Maintained flow voids within the proximal large arterial
vessels.

Skull and upper cervical spine: No focal suspicious marrow lesion.

Sinuses/Orbits: Visualized orbits show no acute finding. No
significant paranasal sinus disease.
IMPRESSION: No evidence of acute intracranial abnormality.

Stable non-contrast MRI appearance of the brain as compared to
02/13/2020.

Small chronic cortically based infarct within the right frontal lobe
(MCA territory).

Mild chronic small vessel ischemic changes within the cerebral white
matter.

Mild generalized parenchymal atrophy.

## 2022-11-02 NOTE — Progress Notes (Unsigned)
   I, Peterson Lombard, LAT, ATC acting as a scribe for Lynne Leader, MD.  Christine [name] is a 71 y.o. female who presents to [facility] today for ??? Pt suffered a recent fall?? Pt locates pain to    Pertinent review of systems: ***  Relevant historical information: ***   Exam:  LMP  (LMP Unknown)  General: Well Developed, well nourished, and in no acute distress.   MSK: ***    Lab and Radiology Results No results found for this or any previous visit (from the past 72 hour(s)). No results found.     Assessment and Plan: 71 y.o. female with ***   PDMP not reviewed this encounter. No orders of the defined types were placed in this encounter.  No orders of the defined types were placed in this encounter.    Discussed warning signs or symptoms. Please see discharge instructions. Patient expresses understanding.   ***

## 2022-11-03 ENCOUNTER — Ambulatory Visit (INDEPENDENT_AMBULATORY_CARE_PROVIDER_SITE_OTHER): Payer: Medicare Other | Admitting: Family Medicine

## 2022-11-03 ENCOUNTER — Other Ambulatory Visit: Payer: Self-pay | Admitting: Family Medicine

## 2022-11-03 ENCOUNTER — Ambulatory Visit (INDEPENDENT_AMBULATORY_CARE_PROVIDER_SITE_OTHER): Payer: Self-pay | Admitting: Family Medicine

## 2022-11-03 VITALS — BP 136/80 | Ht 66.0 in | Wt 142.0 lb

## 2022-11-03 DIAGNOSIS — R42 Dizziness and giddiness: Secondary | ICD-10-CM | POA: Diagnosis not present

## 2022-11-03 DIAGNOSIS — S060X0A Concussion without loss of consciousness, initial encounter: Secondary | ICD-10-CM | POA: Diagnosis not present

## 2022-11-03 DIAGNOSIS — M7989 Other specified soft tissue disorders: Secondary | ICD-10-CM

## 2022-11-03 MED ORDER — NORTRIPTYLINE HCL 10 MG PO CAPS: 10.0000 mg | ORAL_CAPSULE | Freq: Every day | ORAL | 1 refills | Status: DC

## 2022-11-03 NOTE — Progress Notes (Signed)
   [name] [place] Phone: [phone]   Extracorporeal Shockwave Therapy Note    Patient is being treated today with ECSWT. Informed consent was obtained and patient tolerated procedure well.   Therapy performed by Lynne Leader  Condition treated: Calcific mass left calcaneus Treatment preset used: Planter fasciitis Energy used: 90 mJ Frequency used: 12 Hz Number of pulses: 2500 Treatment #4 of #4  Electronically signed by:  [name] 10:36 AM 11/03/22

## 2022-11-03 NOTE — Patient Instructions (Signed)
Thank you for coming in today.   Start nortriptyline at bedtime.   Plan for vestibular PT.   Recheck in 2 weeks.   Ok to use meclizine for significant dizzy symptoms.   For the ringing in your ears (Tinnitus) use white noise like a fan or a sleep machine at bedtime.

## 2022-11-04 ENCOUNTER — Other Ambulatory Visit: Payer: Self-pay | Admitting: Family Medicine

## 2022-11-04 NOTE — Telephone Encounter (Signed)
Patient stated that she is taking 40 mg daily, the 40 mg is on backorder. Sent in 20 mg 2 tablets daily to the pharmacy.

## 2022-11-10 NOTE — Therapy (Incomplete)
OUTPATIENT PHYSICAL THERAPY VESTIBULAR EVALUATION     Patient Name: Christine [name] MRN: [id number] DOB:10-Jun-1951, 71 y.o., female Today's Date: 11/11/2022  END OF SESSION:  PT End of Session - 11/11/22 1116     Visit Number 1    Number of Visits 17    Date for PT Re-Evaluation 01/06/23    Authorization Type Medicare    PT Start Time 1019    PT Stop Time 1102    PT Time Calculation (min) 43 min    Activity Tolerance Patient tolerated treatment well;Other (comment)   imbalance, wooziness   Behavior During Therapy [facility] for tasks assessed/performed             Past Medical History:  Diagnosis Date   Acute bursitis of right shoulder 08/29/2018   Right shoulder injected in August 29, 2018   Allergic rhinitis    Closed displaced fracture of fifth metatarsal bone of left foot 03/16/2017   DEPRESSIVE DISORDER NOT ELSEWHERE CLASSIFIED 03/27/2010   Qualifier: Diagnosis of  By: [name] MD, [name]   [name], NP , [facility] [place]     Diabetes ([place]) 04/02/2016   Diabetes mellitus    Essential hypertension 03/14/2008   Qualifier: Diagnosis of  By: [name] MD, [name]     Fibromyalgia 08/12/2011   Diagnosed by [name] , Rheumatologist 2010    GERD (gastroesophageal reflux disease) 12/25/2016   Gluteal tendonitis of right buttock 10/19/2017   Injected in October 19, 2017 Injected October 07, 2018   Greater trochanteric bursitis of left hip 06/17/2016   Greater trochanteric bursitis of right hip 11/27/2015   Injected 11/27/2015    History of recurrent UTIs    [name]   HTN (hypertension)    Hx of osteopenia 10/11/2012   [facility] DEXA; ordered by [name] Lowest T score: -2.1 @ lumbar spine @ [facility] 11/01/12; decrease of 8.5% vs 05/2009. There is 9.2% risk over 10 yrs History fracture left elbow @ 4 Fractured left wrist , right elbow and nose post fall July/18/2013. Dr. [name] No FH Osteoporosis No PMH of bisphosphonate therapy (generic Fosamax Rxed but not  filled  due to polypharmacy )    Hyperlipidemia    Hyperlipidemia associated with type 2 diabetes mellitus ([place]) 01/18/2007   Qualifier: Diagnosis of  By: [name]  With DM LDL goal = < 100, ideally < 70. Mi in father @ 73; 2 brothers @ 41 & 18     Lumbar radiculopathy 04/11/2008   Qualifier: Diagnosis of  By: [name] MD, Erick Blinks well to epidural 01/12/2017  repeat epidural given May 2018   Migraine headache    quiescent   Nonallopathic lesion of cervical region 07/19/2014   Nonallopathic lesion of lumbosacral region 01/15/2016   Nonallopathic lesion of sacral region 06/27/2018   Nonallopathic lesion-rib cage 09/26/2014   Palpitations 04/15/2011   Rotator cuff impingement syndrome of left shoulder 05/11/2014   Seasonal allergic rhinitis 03/21/2012   Sinusitis, chronic 04/20/2016   Sleep disorder    Dr Dohmier   Subluxation of peroneal tendon of right foot 11/10/2017   TIA (transient ischemic attack)    [place], HX OF 02/02/2008   Qualifier: Diagnosis of  By: Donalee Citrin ADVRS EFF UNS RX MEDICINAL&BIOLOGICAL [place] 02/02/2008   Qualifier: Diagnosis of  By: [name] MD, Posey Pronto AND MYOSITIS 02/02/2008   Qualifier: Diagnosis of  By: [name] MD, [name]  UTI (urinary tract infection) 10/05/2014   Viral upper respiratory tract infection with cough 12/31/2014   Vitamin D deficiency 05/25/2008   Qualifier: Diagnosis of  By: [name] MD, [name]     Past Surgical History:  Procedure Laterality Date   COLONOSCOPY     Dr Carlean [name]; hemorrhoids   CORONARY ANGIOPLASTY  12/15/1995   for chest pain- negative    KNEE ARTHROSCOPY Left 2023   POLYPECTOMY  12/14/2000   benign, hyperplastic polyp; rectal bleeding 2008   TONSILLECTOMY     TOTAL ABDOMINAL HYSTERECTOMY  12/14/1981   BSO for endometriosis   WISDOM TOOTH EXTRACTION     Patient Active Problem List   Diagnosis Date Noted   Calcification of soft tissue 10/12/2022   Rosacea  01/08/2022   Seborrheic keratosis 01/08/2022   Baker's cyst of knee, left 10/09/2021   Acute medial meniscus tear of left knee 09/02/2021   Bacteremia 08/15/2021   Bacteria in urine 08/07/2021   Urinary hesitancy 08/07/2021   Elevated troponin 08/07/2021   Diabetes mellitus type 2 in nonobese ([place]) 08/07/2021   Tachycardia 08/07/2021   Sepsis ([place]) 08/06/2021   Dizziness 04/22/2021   Arthritis of right hip 02/12/2021   Leg swelling 07/12/2020   Cervical disc disorder with radiculopathy of cervical region 01/25/2020   Coronary atherosclerosis of native coronary artery 01/25/2020   Subluxation of peroneal tendon of right foot 11/10/2017   Lumbar radiculopathy, right 06/23/2017   Closed displaced fracture of fifth metatarsal bone of left foot 03/16/2017   GERD (gastroesophageal reflux disease) 12/25/2016   Migraine 05/06/2016   Type 2 diabetes mellitus with hyperglycemia, without long-term current use of insulin ([place]) 04/02/2016   Nonallopathic lesion of lumbosacral region 01/15/2016   Greater trochanteric bursitis of right hip 11/27/2015   Routine general medical examination at a health care facility 11/25/2015   Nonallopathic lesion of thoracic region 09/26/2014   Nonallopathic lesion-rib cage 09/26/2014   Nonallopathic lesion of cervical region 07/19/2014   Hx of osteopenia 10/11/2012   Seasonal allergic rhinitis 03/21/2012   Fibromyalgia 08/12/2011   Palpitations 04/15/2011   DEPRESSIVE DISORDER NOT ELSEWHERE CLASSIFIED 03/27/2010   Vitamin D deficiency 05/25/2008   Lumbar radiculopathy 04/11/2008   Essential hypertension 03/14/2008   History of cardiovascular disorder 02/02/2008   Hyperlipidemia associated with type 2 diabetes mellitus ([place]) 01/18/2007    PCP: [name], MD  REFERRING PROVIDER: Gregor Hams, MD  REFERRING DIAG: [phone] (ICD-10-CM) - Concussion without loss of consciousness, initial encounter R42 (ICD-10-CM) - Dizzy  THERAPY DIAG:  Dizziness  and giddiness  Unsteadiness on feet  Acute post-traumatic headache, not intractable  ONSET DATE: 10/27/22  Rationale for Evaluation and Treatment: Rehabilitation  SUBJECTIVE:   SUBJECTIVE STATEMENT: Patient reports that she was diagnosed with a concussion from a near-syncopal episode that occurred on 10/27/22. Hit the front of her head and nose on the floor- had other syncopal episodes when she had sepsis last year. Does not know what the cause of this syncopal episode was. When waking up the next AM, had fullness in ears, tinnitus, and nausea and dizziness when moving her head. Tinnitus and aural fullness worse by the end of the day. Had another spell of near-syncope on Saturday when she fell to her knees again. Denies vision changes/double vision, hearing loss. Does report phonophobia. Still feels like she may pass out while standing while singing in choir  Pt accompanied by: self  PERTINENT HISTORY: DM, HTN, fibromyalgia, GERD, HTN, HLD, migraines, TIA, L knee scope 2023  PAIN:  Are you having pain? Yes: NPRS scale: 7-8/10 Pain location: frontal HA Pain description: ache Aggravating factors: loud sounds Relieving factors: nothing  PRECAUTIONS: Fall  WEIGHT BEARING RESTRICTIONS: No  FALLS: Has patient fallen in last 6 months? Yes. Number of falls 2  LIVING ENVIRONMENT: Lives with: lives with their spouse Lives in: House/apartment Stairs:  2 steps with handrail; 1 story home  Has following equipment at home: Grab bars  PLOF: Independent  PATIENT GOALS: stand while singing in choir,   OBJECTIVE:   DIAGNOSTIC FINDINGS: 10/27/22 head/maxillofacial/neck CT:  No acute intracranial process. 2. No acute facial bone fractures. 3. No acute cervical spine fracture. 4. Multilevel cervical spondylosis and facet hypertrophy as above.  COGNITION: Overall cognitive status: Within functional limits for tasks assessed   SENSATION: WFL Reports occasional UE/LE N/T at baseline    POSTURE:  forward head  Cervical ROM:    Active A/PROM (deg) eval  Flexion 42  Extension 30 *nonpainful crepitus   Right lateral flexion 33  Left lateral flexion 12  Right rotation 59  Left rotation 58  (Blank rows = not tested)   LOWER EXTREMITY MMT:   MMT Right eval Left eval  Hip flexion    Hip abduction    Hip adduction    Hip internal rotation    Hip external rotation    Knee flexion    Knee extension    Ankle dorsiflexion    Ankle plantarflexion    Ankle inversion    Ankle eversion    (Blank rows = not tested)   GAIT: Gait pattern:  slow and cautious, furniture walking Assistive device utilized: None Level of assistance: SBA and CGA   FUNCTIONAL TESTS:   NT d/t time  PATIENT SURVEYS:  FOTO 45  VESTIBULAR ASSESSMENT:  GENERAL OBSERVATION: pt wears distance and reader contacts; neck resting in slight R lateral flexion   OCULOMOTOR EXAM:  Ocular Alignment: normal   Ocular ROM:  limited in inferior motion, particularly L eye  Spontaneous Nystagmus: absent  Gaze-Induced Nystagmus: absent  Smooth Pursuits:  difficulty in inferior gaze  Saccades:  difficulty maintaining gaze inferiorly; L eye staying in midline   Convergence/Divergence: 6 cm    VESTIBULAR - OCULAR REFLEX:   Slow VOR: Comment: c/o dizziness horizontal and vertical and c/o blurred vision  VOR Cancellation: Comment: NT d/t time  Head-Impulse Test:  NT d/t time   POSITIONAL TESTING:  NT d/t time  MOTION SENSITIVITY:  Motion Sensitivity Quotient Intensity: 0 = none, 1 = Lightheaded, 2 = Mild, 3 = Moderate, 4 = Severe, 5 = Vomiting  Intensity  1. Sitting to supine   2. Supine to L side   3. Supine to R side   4. Supine to sitting   5. L Hallpike-Dix   6. Up from L    7. R Hallpike-Dix   8. Up from R    9. Sitting, head tipped to L knee   10. Head up from L knee   11. Sitting, head tipped to R knee   12. Head up from R knee   13. Sitting head turns x5   14.Sitting head  nods x5   15. In stance, 180 turn to L    16. In stance, 180 turn to R       Orthostatic Testing   Supine Sitting Standing  x1 Minute Standing x 3 Minutes  BP 146/87 117/83 *woozy 96/77 *woozy Error *woozy  HR 88 94 114 -  VESTIBULAR TREATMENT:                                                                                                   DATE: 11/11/22   PATIENT EDUCATION: Education details: Encouraged patient to call PCP now to see if she can get f/u appointment d/t continued near-syncope; prognosis, POC, HEP Person educated: Patient Education method: Explanation, Demonstration, Tactile cues, Verbal cues, and Handouts Education comprehension: verbalized understanding  HOME EXERCISE PROGRAM:  Access Code: [email or web address] URL: [email or web address] Date: 11/11/2022 Prepared by: [facility]  Exercises - Seated Cervical Rotation AROM  - 1 x daily - 5 x weekly - 2 sets - 10 reps - Seated Cervical Sidebending AROM  - 1 x daily - 5 x weekly - 2 sets - 10 reps - Seated Cervical Flexion AROM  - 1 x daily - 5 x weekly - 2 sets - 10 reps - Seated Cervical Extension AROM  - 1 x daily - 5 x weekly - 2 sets - 10 reps  Patient Education - Orthostatic Hypotension  GOALS: Goals reviewed with patient? Yes  SHORT TERM GOALS: Target date: 12/02/2022  Patient to be independent with initial HEP. Baseline: HEP initiated Goal status: INITIAL    LONG TERM GOALS: Target date: 01/06/2023  Patient to be independent with advanced HEP. Baseline: Not yet initiated  Goal status: INITIAL  Patient to report 0/10 dizziness with standing vertical and horizontal VOR for 30 seconds. Baseline: Unable Goal status: INITIAL  Patient will report 0/10 dizziness with bed mobility.  Baseline: Symptomatic  Goal status: INITIAL  Patient to demonstrate mild-moderate sway with M-CTSIB condition with eyes closed/foam surface in order to improve  safety in environments with uneven surfaces and dim lighting. Baseline: NT Goal status: INITIAL  Patient to score at least 20/24 on DGI in order to decrease risk of falls. Baseline: NT Goal status: INITIAL  Patient to report tolerance for standing to sing in the choir at church without symptoms limiting.  Baseline: Unable Goal status: INITIAL  Patient to score at least 57 on FOTO in order to indicate improved functional outcomes.  Baseline: 45 Goal status: INITIAL     ASSESSMENT:  CLINICAL IMPRESSION:   Patient is a 71 y/o F presenting to OPPT with c/o dizziness and lightheadedness s/p near-syncopal episode occurring on 10/27/22. Reports aural fullness, tinnitus, nausea and dizziness when moving her head, phonophobia. Denies vision changes/double vision, hearing loss. Patient reports that the cause of syncope is not known, but reports she had another episode on Saturday without resultant injury. Also reports feeling like she may pass out while standing to sing in the choir at church. Oculomotor exam revealed Limited L eye ROM in inferior direction limited smooth pursuits and saccades in this direction, limited convergence, dizziness with VOR. Testing positive for orthostasis and was educated on this condition and management of it. Patient was educated on gentle cervical ROM HEP and reported understanding. Would benefit from skilled PT services 2x/week for 8 weeks to address aforementioned impairments in order to optimize level of function.  OBJECTIVE IMPAIRMENTS: Abnormal gait, decreased activity tolerance, decreased balance, decreased ROM, dizziness, impaired flexibility, improper body mechanics, postural dysfunction, and pain.   ACTIVITY LIMITATIONS: lifting, bending, standing, squatting, stairs, transfers, bed mobility, bathing, toileting, dressing, reach over head, and hygiene/grooming  PARTICIPATION LIMITATIONS: meal prep, cleaning, laundry, shopping, community activity, yard  work, and church  PERSONAL FACTORS: Age, Past/current experiences, Time since onset of injury/illness/exacerbation, and 3+ comorbidities: DM, HTN, fibromyalgia, GERD, HTN, HLD, migraines, TIA, L knee scope 2023  are also affecting patient's functional outcome.   REHAB POTENTIAL: Good  CLINICAL DECISION MAKING: Evolving/moderate complexity  EVALUATION COMPLEXITY: Moderate   PLAN:  PT FREQUENCY: 2x/week  PT DURATION: 8 weeks  PLANNED INTERVENTIONS: Therapeutic exercises, Therapeutic activity, Neuromuscular re-education, Balance training, Gait training, Patient/Family education, Self Care, Joint mobilization, Stair training, Vestibular training, Canalith repositioning, Aquatic Therapy, Dry Needling, Electrical stimulation, Cryotherapy, Moist heat, Taping, Manual therapy, and Re-evaluation  PLAN FOR NEXT SESSION: complete oculomotor exam and positional testing, MCTSIB, DGI, initiate HEP for VOR/habituation/balance     Janene Harvey, PT, DPT 11/11/22 11:25 AM  [facility] [address], [place],  [address] Phone # [phone] Fax # [phone]

## 2022-11-11 ENCOUNTER — Ambulatory Visit: Payer: Medicare Other | Attending: Family Medicine | Admitting: Physical Therapy

## 2022-11-11 ENCOUNTER — Other Ambulatory Visit: Payer: Self-pay

## 2022-11-11 ENCOUNTER — Telehealth: Payer: Self-pay | Admitting: Physical Therapy

## 2022-11-11 DIAGNOSIS — S060X0A Concussion without loss of consciousness, initial encounter: Secondary | ICD-10-CM | POA: Insufficient documentation

## 2022-11-11 DIAGNOSIS — R2681 Unsteadiness on feet: Secondary | ICD-10-CM | POA: Diagnosis not present

## 2022-11-11 DIAGNOSIS — G44319 Acute post-traumatic headache, not intractable: Secondary | ICD-10-CM | POA: Diagnosis not present

## 2022-11-11 DIAGNOSIS — R42 Dizziness and giddiness: Secondary | ICD-10-CM | POA: Diagnosis not present

## 2022-11-11 NOTE — Telephone Encounter (Signed)
Hi Dr. Cherlynn Kaiser,   I evaluated Christine [name] today s/p syncopal episode resulting in head trauma and concussion per referral of Dr. Lynne Leader. Today patient demonstrated significant orthostasis which may be contributing to past and continued near-syncopal episodes. Please advise and see values below.    Orthostatic Testing    Supine Sitting Standing  x1 Minute Standing x 3 Minutes  BP 146/87 117/83 *woozy 96/77 *woozy Error *woozy  HR 88 94 114 -        Thanks!  Janene Harvey, PT, DPT 11/11/22 11:30 AM  [facility] [address] [place] [address] [place], [place]  [address] Phone:  [phone] Fax:  [phone]

## 2022-11-11 NOTE — Telephone Encounter (Signed)
Left message to return call. If patient call back, please give her message below, if I am unavailable to talk with her.

## 2022-11-12 NOTE — Telephone Encounter (Signed)
Patient notified and verbalized understanding. 

## 2022-11-16 ENCOUNTER — Ambulatory Visit: Payer: Medicare Other | Attending: Family Medicine

## 2022-11-16 DIAGNOSIS — R2681 Unsteadiness on feet: Secondary | ICD-10-CM | POA: Diagnosis not present

## 2022-11-16 DIAGNOSIS — R42 Dizziness and giddiness: Secondary | ICD-10-CM | POA: Insufficient documentation

## 2022-11-16 DIAGNOSIS — G44319 Acute post-traumatic headache, not intractable: Secondary | ICD-10-CM | POA: Diagnosis not present

## 2022-11-16 NOTE — Therapy (Signed)
OUTPATIENT PHYSICAL THERAPY VESTIBULAR TREATMENT     Patient Name: Christine [name] MRN: [id number] DOB:06-28-1951, 71 y.o., female Today's Date: 11/16/2022  END OF SESSION:  PT End of Session - 11/16/22 1311     Visit Number 2    Number of Visits 17    Date for PT Re-Evaluation 01/06/23    Authorization Type Medicare    PT Start Time 1315    PT Stop Time 1400    PT Time Calculation (min) 45 min    Activity Tolerance Patient tolerated treatment well;Other (comment)   imbalance, wooziness   Behavior During Therapy [facility] for tasks assessed/performed             Past Medical History:  Diagnosis Date   Acute bursitis of right shoulder 08/29/2018   Right shoulder injected in August 29, 2018   Allergic rhinitis    Closed displaced fracture of fifth metatarsal bone of left foot 03/16/2017   DEPRESSIVE DISORDER NOT ELSEWHERE CLASSIFIED 03/27/2010   Qualifier: Diagnosis of  By: [name] MD, [name]   [name], NP , [facility] [place]     Diabetes ([place]) 04/02/2016   Diabetes mellitus    Essential hypertension 03/14/2008   Qualifier: Diagnosis of  By: [name] MD, [name]     Fibromyalgia 08/12/2011   Diagnosed by [name] , Rheumatologist 2010    GERD (gastroesophageal reflux disease) 12/25/2016   Gluteal tendonitis of right buttock 10/19/2017   Injected in October 19, 2017 Injected October 07, 2018   Greater trochanteric bursitis of left hip 06/17/2016   Greater trochanteric bursitis of right hip 11/27/2015   Injected 11/27/2015    History of recurrent UTIs    [name]   HTN (hypertension)    Hx of osteopenia 10/11/2012   [facility] DEXA; ordered by [name] Lowest T score: -2.1 @ lumbar spine @ [facility] 11/01/12; decrease of 8.5% vs 05/2009. There is 9.2% risk over 10 yrs History fracture left elbow @ 4 Fractured left wrist , right elbow and nose post fall July/18/2013. Dr. [name] No FH Osteoporosis No PMH of bisphosphonate therapy (generic Fosamax Rxed but not  filled  due to polypharmacy )    Hyperlipidemia    Hyperlipidemia associated with type 2 diabetes mellitus ([place]) 01/18/2007   Qualifier: Diagnosis of  By: [name]  With DM LDL goal = < 100, ideally < 70. Mi in father @ 109; 2 brothers @ 25 & 44     Lumbar radiculopathy 04/11/2008   Qualifier: Diagnosis of  By: [name] MD, Erick Blinks well to epidural 01/12/2017  repeat epidural given May 2018   Migraine headache    quiescent   Nonallopathic lesion of cervical region 07/19/2014   Nonallopathic lesion of lumbosacral region 01/15/2016   Nonallopathic lesion of sacral region 06/27/2018   Nonallopathic lesion-rib cage 09/26/2014   Palpitations 04/15/2011   Rotator cuff impingement syndrome of left shoulder 05/11/2014   Seasonal allergic rhinitis 03/21/2012   Sinusitis, chronic 04/20/2016   Sleep disorder    Dr Dohmier   Subluxation of peroneal tendon of right foot 11/10/2017   TIA (transient ischemic attack)    [place], HX OF 02/02/2008   Qualifier: Diagnosis of  By: Donalee Citrin ADVRS EFF UNS RX MEDICINAL&BIOLOGICAL [place] 02/02/2008   Qualifier: Diagnosis of  By: [name] MD, Posey Pronto AND MYOSITIS 02/02/2008   Qualifier: Diagnosis of  By: [name] MD, [name]  UTI (urinary tract infection) 10/05/2014   Viral upper respiratory tract infection with cough 12/31/2014   Vitamin D deficiency 05/25/2008   Qualifier: Diagnosis of  By: [name] MD, [name]     Past Surgical History:  Procedure Laterality Date   COLONOSCOPY     Dr Carlean [name]; hemorrhoids   CORONARY ANGIOPLASTY  12/15/1995   for chest pain- negative    KNEE ARTHROSCOPY Left 2023   POLYPECTOMY  12/14/2000   benign, hyperplastic polyp; rectal bleeding 2008   TONSILLECTOMY     TOTAL ABDOMINAL HYSTERECTOMY  12/14/1981   BSO for endometriosis   WISDOM TOOTH EXTRACTION     Patient Active Problem List   Diagnosis Date Noted   Calcification of soft tissue 10/12/2022   Rosacea  01/08/2022   Seborrheic keratosis 01/08/2022   Baker's cyst of knee, left 10/09/2021   Acute medial meniscus tear of left knee 09/02/2021   Bacteremia 08/15/2021   Bacteria in urine 08/07/2021   Urinary hesitancy 08/07/2021   Elevated troponin 08/07/2021   Diabetes mellitus type 2 in nonobese ([place]) 08/07/2021   Tachycardia 08/07/2021   Sepsis ([place]) 08/06/2021   Dizziness 04/22/2021   Arthritis of right hip 02/12/2021   Leg swelling 07/12/2020   Cervical disc disorder with radiculopathy of cervical region 01/25/2020   Coronary atherosclerosis of native coronary artery 01/25/2020   Subluxation of peroneal tendon of right foot 11/10/2017   Lumbar radiculopathy, right 06/23/2017   Closed displaced fracture of fifth metatarsal bone of left foot 03/16/2017   GERD (gastroesophageal reflux disease) 12/25/2016   Migraine 05/06/2016   Type 2 diabetes mellitus with hyperglycemia, without long-term current use of insulin ([place]) 04/02/2016   Nonallopathic lesion of lumbosacral region 01/15/2016   Greater trochanteric bursitis of right hip 11/27/2015   Routine general medical examination at a health care facility 11/25/2015   Nonallopathic lesion of thoracic region 09/26/2014   Nonallopathic lesion-rib cage 09/26/2014   Nonallopathic lesion of cervical region 07/19/2014   Hx of osteopenia 10/11/2012   Seasonal allergic rhinitis 03/21/2012   Fibromyalgia 08/12/2011   Palpitations 04/15/2011   DEPRESSIVE DISORDER NOT ELSEWHERE CLASSIFIED 03/27/2010   Vitamin D deficiency 05/25/2008   Lumbar radiculopathy 04/11/2008   Essential hypertension 03/14/2008   History of cardiovascular disorder 02/02/2008   Hyperlipidemia associated with type 2 diabetes mellitus ([place]) 01/18/2007    PCP: [name], MD  REFERRING PROVIDER: Gregor Hams, MD  REFERRING DIAG: [phone] (ICD-10-CM) - Concussion without loss of consciousness, initial encounter R42 (ICD-10-CM) - Dizzy  THERAPY DIAG:  Dizziness  and giddiness  Unsteadiness on feet  Acute post-traumatic headache, not intractable  ONSET DATE: 10/27/22  Rationale for Evaluation and Treatment: Rehabilitation  SUBJECTIVE:   SUBJECTIVE STATEMENT: Pt reports continued low BP measurements and notes it appears to be positional in nature. Pt reports she has been experiencing tinnitus since her fall which seemed to be   Pt accompanied by: self  PERTINENT HISTORY: DM, HTN, fibromyalgia, GERD, HTN, HLD, migraines, TIA, L knee scope 2023  PAIN:  Are you having pain? Yes: NPRS scale: 7-8/10 Pain location: frontal HA Pain description: ache Aggravating factors: loud sounds Relieving factors: nothing  PRECAUTIONS: Fall  WEIGHT BEARING RESTRICTIONS: No  FALLS: Has patient fallen in last 6 months? Yes. Number of falls 2  LIVING ENVIRONMENT: Lives with: lives with their spouse Lives in: House/apartment Stairs:  2 steps with handrail; 1 story home  Has following equipment at home: Grab bars  PLOF: Independent  PATIENT GOALS: stand while singing  in choir,   OBJECTIVE:   TODAY'S TREATMENT: 11/16/22 Activity Comments  Vitals -sitting 121/78, 96 bpm -standing x 1 min: error   Oculomotor exam   Positional testing -Roll Test: left/right, no nystagmus some symptoms rolling left -Dix-Hallpike: left/right, no nystagmus no symptoms, some motion sensitivity with arising left Dix-Hallpike  M-CTSIB   VOR cancellation (Sitting) Increased symptoms, slow pace, stops at midline  Corner balance -feet together EO/EC x 15 sec     M-CTSIB  Condition 1: Firm Surface, EO 30 Sec, Mild Sway  Condition 2: Firm Surface, EC 30 Sec, Mild and Moderate Sway  Condition 3: Foam Surface, EO 30 Sec, Mild and Moderate Sway  Condition 4: Foam Surface, EC 10 Sec, Severe Sway    DIAGNOSTIC FINDINGS: 10/27/22 head/maxillofacial/neck CT:  No acute intracranial process. 2. No acute facial bone fractures. 3. No acute cervical spine fracture. 4. Multilevel  cervical spondylosis and facet hypertrophy as above.  COGNITION: Overall cognitive status: Within functional limits for tasks assessed   SENSATION: WFL Reports occasional UE/LE N/T at baseline   POSTURE:  forward head  Cervical ROM:    Active A/PROM (deg) eval  Flexion 42  Extension 30 *nonpainful crepitus   Right lateral flexion 33  Left lateral flexion 12  Right rotation 59  Left rotation 58  (Blank rows = not tested)   LOWER EXTREMITY MMT:   MMT Right eval Left eval  Hip flexion    Hip abduction    Hip adduction    Hip internal rotation    Hip external rotation    Knee flexion    Knee extension    Ankle dorsiflexion    Ankle plantarflexion    Ankle inversion    Ankle eversion    (Blank rows = not tested)   GAIT: Gait pattern:  slow and cautious, furniture walking Assistive device utilized: None Level of assistance: SBA and CGA   FUNCTIONAL TESTS:   NT d/t time  PATIENT SURVEYS:  FOTO 45  VESTIBULAR ASSESSMENT:  GENERAL OBSERVATION: pt wears distance and reader contacts; neck resting in slight R lateral flexion   OCULOMOTOR EXAM:  Ocular Alignment: normal   Ocular ROM:  limited in inferior motion, particularly L eye  Spontaneous Nystagmus: absent  Gaze-Induced Nystagmus: absent  Smooth Pursuits:  difficulty in inferior gaze  Saccades:  difficulty maintaining gaze inferiorly; L eye staying in midline   Convergence/Divergence: 6 cm    VESTIBULAR - OCULAR REFLEX:   Slow VOR: Comment: c/o dizziness horizontal and vertical and c/o blurred vision  VOR Cancellation: woozy/symptomatic  Head-Impulse Test: -right; +left   POSITIONAL TESTING:   -Left/Right Roll Test: no nystagmus, no symptoms other than slight feeling when rolling left -Right/Left Dix-Hallpike: no nystagmus. Some symptoms when coming up from left DH  MOTION SENSITIVITY:  Motion Sensitivity Quotient Intensity: 0 = none, 1 = Lightheaded, 2 = Mild, 3 = Moderate, 4 = Severe, 5 =  Vomiting  Intensity  1. Sitting to supine   2. Supine to L side   3. Supine to R side   4. Supine to sitting   5. L Hallpike-Dix   6. Up from L DH 2  7. R Hallpike-Dix 0  8. Up from R    9. Sitting, head tipped to L knee   10. Head up from L knee   11. Sitting, head tipped to R knee   12. Head up from R knee   13. Sitting head turns x5   14.Sitting head nods x5  15. In stance, 180 turn to L    16. In stance, 180 turn to R       Orthostatic Testing   Supine Sitting Standing  x1 Minute Standing x 3 Minutes  BP 146/87 117/83 *woozy 96/77 *woozy Error *woozy  HR 88 94 114 -       VESTIBULAR TREATMENT:                                                                                                   DATE: 11/11/22   PATIENT EDUCATION: Education details: Encouraged patient to call PCP now to see if she can get f/u appointment d/t continued near-syncope; prognosis, POC, HEP Person educated: Patient Education method: Explanation, Demonstration, Tactile cues, Verbal cues, and Handouts Education comprehension: verbalized understanding  HOME EXERCISE PROGRAM:  Access Code: [email or web address] URL: [email or web address] Date: 11/11/2022 Prepared by: [facility]  Exercises - Seated Cervical Rotation AROM  - 1 x daily - 5 x weekly - 2 sets - 10 reps - Seated Cervical Sidebending AROM  - 1 x daily - 5 x weekly - 2 sets - 10 reps - Seated Cervical Flexion AROM  - 1 x daily - 5 x weekly - 2 sets - 10 reps - Seated Cervical Extension AROM  - 1 x daily - 5 x weekly - 2 sets - 10 reps - Corner Balance Feet Together With Eyes Open  - 1 x daily - 7 x weekly - 1-3 sets - 30 sec hold - Corner Balance Feet Together With Eyes Closed  - 1 x daily - 7 x weekly - 1-3 sets - 15-30 hold Patient Education - Orthostatic Hypotension  GOALS: Goals reviewed with patient? Yes  SHORT TERM GOALS: Target date: 12/02/2022  Patient to be independent with  initial HEP. Baseline: HEP initiated Goal status: INITIAL    LONG TERM GOALS: Target date: 01/06/2023  Patient to be independent with advanced HEP. Baseline: Not yet initiated  Goal status: INITIAL  Patient to report 0/10 dizziness with standing vertical and horizontal VOR for 30 seconds. Baseline: Unable Goal status: INITIAL  Patient will report 0/10 dizziness with bed mobility.  Baseline: Symptomatic  Goal status: INITIAL  Patient to demonstrate mild-moderate sway with M-CTSIB condition with eyes closed/foam surface in order to improve safety in environments with uneven surfaces and dim lighting. Baseline: NT Goal status: INITIAL  Patient to score at least 20/24 on DGI in order to decrease risk of falls. Baseline: NT Goal status: INITIAL  Patient to report tolerance for standing to sing in the choir at church without symptoms limiting.  Baseline: Unable Goal status: INITIAL  Patient to score at least 57 on FOTO in order to indicate improved functional outcomes.  Baseline: 45 Goal status: INITIAL     ASSESSMENT:  CLINICAL IMPRESSION: Pt reports she has been recording and cataloging her BP since last visit and has had additional episodes of orthostasis. Continued with vestibular/oculomotor assessment and reveals deficits with VOR and pronounced motion sensitivity to VOR cancellation as well as certain positions, most notably when the  left ear is in a dependent position, e.g. rolling left and sitting up from left Dix-Hallpike.  Demonstrates balance and postural instability via M-CTSIB assessment with most pronounced deifcits under condition 4.  Continued with HEP development to include activities of habituation and gaze adaptation. Continued sessions to progress vestibular/concussion rehab.   OBJECTIVE IMPAIRMENTS: Abnormal gait, decreased activity tolerance, decreased balance, decreased ROM, dizziness, impaired flexibility, improper body mechanics, postural dysfunction,  and pain.   ACTIVITY LIMITATIONS: lifting, bending, standing, squatting, stairs, transfers, bed mobility, bathing, toileting, dressing, reach over head, and hygiene/grooming  PARTICIPATION LIMITATIONS: meal prep, cleaning, laundry, shopping, community activity, yard work, and church  PERSONAL FACTORS: Age, Past/current experiences, Time since onset of injury/illness/exacerbation, and 3+ comorbidities: DM, HTN, fibromyalgia, GERD, HTN, HLD, migraines, TIA, L knee scope 2023  are also affecting patient's functional outcome.   REHAB POTENTIAL: Good  CLINICAL DECISION MAKING: Evolving/moderate complexity  EVALUATION COMPLEXITY: Moderate   PLAN:  PT FREQUENCY: 2x/week  PT DURATION: 8 weeks  PLANNED INTERVENTIONS: Therapeutic exercises, Therapeutic activity, Neuromuscular re-education, Balance training, Gait training, Patient/Family education, Self Care, Joint mobilization, Stair training, Vestibular training, Canalith repositioning, Aquatic Therapy, Dry Needling, Electrical stimulation, Cryotherapy, Moist heat, Taping, Manual therapy, and Re-evaluation  PLAN FOR NEXT SESSION: position habituation? Vs gaze stabilization? Corner balance (with BP monitoring?)   2:18 PM, 11/16/22 M. Sherlyn Lees, PT, DPT Physical Therapist- [place] Office Number: [phone]   [place] [facility] [address] [place], [place] [address] Phone # [phone] Fax # [phone]

## 2022-11-18 ENCOUNTER — Ambulatory Visit (INDEPENDENT_AMBULATORY_CARE_PROVIDER_SITE_OTHER): Payer: Medicare Other | Admitting: Family Medicine

## 2022-11-18 ENCOUNTER — Encounter: Payer: Self-pay | Admitting: Family Medicine

## 2022-11-18 VITALS — BP 110/82 | HR 115 | Temp 97.8°F | Ht 66.0 in | Wt 141.5 lb

## 2022-11-18 DIAGNOSIS — R000 Tachycardia, unspecified: Secondary | ICD-10-CM | POA: Diagnosis not present

## 2022-11-18 DIAGNOSIS — I952 Hypotension due to drugs: Secondary | ICD-10-CM | POA: Diagnosis not present

## 2022-11-18 NOTE — Progress Notes (Unsigned)
Subjective:   I, Peterson Lombard, LAT, ATC acting as a scribe for Lynne Leader, MD.  Chief Complaint: Christine [name],  is a 71 y.o. female who presents for f/u concussion.  Pt suffered a fall on 10/27/22 when she became dizzy while in her kitchen, falling face forward. Pt was last seen by Dr. Georgina Snell on 11/03/22 and was advised to use meclizine, prescribed nortriptyline, and was referred to vestibular therapy, completing 2 visits. Today, pt reports  Dx testing: 10/27/22 EKG, Head, C-spine, & Maxillofacial CT   Injury date : 10/27/22 Visit #: 2  History of Present Illness:   Concussion Self-Reported Symptom Score Symptoms rated on a scale 1-6, in last 24 hours  Headache: ***   Pressure in head: *** Neck pain: *** Nausea or vomiting: *** Dizziness: ***  Blurred vision: ***  Balance problems: *** Sensitivity to light:  *** Sensitivity to noise: *** Feeling slowed down: *** Feeling like "in a fog": *** "Don't feel right": *** Difficulty concentrating: *** Difficulty remembering: *** Fatigue or low energy: *** Confusion: *** Drowsiness: *** More emotional: *** Irritability: *** Sadness: *** Nervous or anxious: *** Trouble falling asleep: ***   Total # of Symptoms: *** Total Symptom Score: ***  Previous Total # of Symptoms: 19/22 Previous Symptom Score: 56/132  Tinnitus: Yes/No  Review of Systems:  ***    Review of History: ***  Objective:    Physical Examination There were no vitals filed for this visit. MSK:  *** Neuro: *** Psych: ***     Imaging:  ***  Assessment and Plan   71 y.o. female with ***    ***    Action/Discussion: Reviewed diagnosis, management options, expected outcomes, and the reasons for scheduled and emergent follow-up. Questions were adequately answered. Patient expressed verbal understanding and agreement with the following plan.     Patient Education: Reviewed with patient the risks (i.e, a repeat concussion,  post-concussion syndrome, second-impact syndrome) of returning to play prior to complete resolution, and thoroughly reviewed the signs and symptoms of concussion.Reviewed need for complete resolution of all symptoms, with rest AND exertion, prior to return to play. Reviewed red flags for urgent medical evaluation: worsening symptoms, nausea/vomiting, intractable headache, musculoskeletal changes, focal neurological deficits. [facility] Concussion Care Plan, which clearly outlines the plans stated above, was given to patient.   Level of service: ***     After Visit Summary printed out and provided to patient as appropriate.  The above documentation has been reviewed and is accurate and complete Mare Ferrari

## 2022-11-18 NOTE — Progress Notes (Signed)
Subjective:     Patient ID: Christine [name], female    DOB: November 04, 1951, 72 y.o.   MRN: [id number]  Chief Complaint  Patient presents with   Follow-up    Follow-up on blood pressures, run high and low at times, was taking off of blood pressure medication due to having low blood pressure    HPI Fell-near syncope and concussion.  No low sugars.   HR will go up if bp down.  HR 130-140's at times.  Sits down and better. On 12/4 1130 83/65-standing and dizzy and HR 138.   Has been going on for 2 yrs.   Going to neuro rehab clinic for concussion-did orthostatics.  And +.  Saw Dr. Daneen Schick Card.  On ozempic for couple yrs and lost wt.  Not occur every time stands up.  Doing better since off losartan for 1 wk.  Had episode on 12/4.  Bp was 180 once.  130-160's/70-80's.    No new ha-just chronic.  No cp/sob.   On nortip now.  Does have ringing in ears since concussion.  But some better. No c/psob.  No edema-no compression stockings yet.  Drinking more water.   Health Maintenance Due  Topic Date Due   COLONOSCOPY (Pts 45-64yr Insurance coverage will need to be confirmed)  11/02/2017   DTaP/Tdap/Td (2 - Tdap) 06/08/2019   FOOT EXAM  09/23/2022    Past Medical History:  Diagnosis Date   Acute bursitis of right shoulder 08/29/2018   Right shoulder injected in August 29, 2018   Allergic rhinitis    Closed displaced fracture of fifth metatarsal bone of left foot 03/16/2017   DEPRESSIVE DISORDER NOT ELSEWHERE CLASSIFIED 03/27/2010   Qualifier: Diagnosis of  By: HLinna DarnerMD, WGwyndolyn Saxon  Mr BGalen Manila NP , [facility] [place]     Diabetes ([place] 04/02/2016   Diabetes mellitus    Essential hypertension 03/14/2008   Qualifier: Diagnosis of  By: HLinna DarnerMD, [name]     Fibromyalgia 08/12/2011   Diagnosed by Dr ZMarveen Reeks, Rheumatologist 2010    GERD (gastroesophageal reflux disease) 12/25/2016   Gluteal tendonitis of right buttock 10/19/2017   Injected in October 19, 2017 Injected October 07, 2018   Greater trochanteric bursitis of left hip 06/17/2016   Greater trochanteric bursitis of right hip 11/27/2015   Injected 11/27/2015    History of recurrent UTIs    [name]   HTN (hypertension)    Hx of osteopenia 10/11/2012   [facility]; ordered by Dr HLinna DarnerLowest T score: -2.1 @ lumbar spine @ [facility] 11/01/12; decrease of 8.5% vs 05/2009. There is 9.2% risk over 10 yrs History fracture left elbow @ 6 Fractured left wrist , right elbow and nose post fall July/18/2013. Dr. OCaralyn GuileNo FH Osteoporosis No PMH of bisphosphonate therapy (generic Fosamax Rxed but not filled  due to polypharmacy )    Hyperlipidemia    Hyperlipidemia associated with type 2 diabetes mellitus ([place] 01/18/2007   Qualifier: Diagnosis of  By: DAllen Norris With DM LDL goal = < 100, ideally < 70. Mi in father @ 549 2 brothers @ 576& 516    Lumbar radiculopathy 04/11/2008   Qualifier: Diagnosis of  By: HLinna DarnerMD, WErick Blinkswell to epidural 01/12/2017  repeat epidural given May 2018   Migraine headache    quiescent   Nonallopathic lesion of cervical region 07/19/2014   Nonallopathic lesion of lumbosacral region 01/15/2016   Nonallopathic  lesion of sacral region 06/27/2018   Nonallopathic lesion-rib cage 09/26/2014   Palpitations 04/15/2011   Rotator cuff impingement syndrome of left shoulder 05/11/2014   Seasonal allergic rhinitis 03/21/2012   Sinusitis, chronic 04/20/2016   Sleep disorder    Dr Dohmier   Subluxation of peroneal tendon of right foot 11/10/2017   TIA (transient ischemic attack)    [place], HX OF 02/02/2008   Qualifier: Diagnosis of  By: Donalee Citrin ADVRS EFF UNS RX MEDICINAL&BIOLOGICAL [place] 02/02/2008   Qualifier: Diagnosis of  By: [name] MD, Posey Pronto AND MYOSITIS 02/02/2008   Qualifier: Diagnosis of  By: [name] MD, [name]     UTI (urinary tract infection) 10/05/2014   Viral upper respiratory tract infection with cough 12/31/2014    Vitamin D deficiency 05/25/2008   Qualifier: Diagnosis of  By: [name] MD, [name]      Past Surgical History:  Procedure Laterality Date   COLONOSCOPY     Dr Carlean [name]; hemorrhoids   CORONARY ANGIOPLASTY  12/15/1995   for chest pain- negative    KNEE ARTHROSCOPY Left 2023   POLYPECTOMY  12/14/2000   benign, hyperplastic polyp; rectal bleeding 2008   TONSILLECTOMY     TOTAL ABDOMINAL HYSTERECTOMY  12/14/1981   BSO for endometriosis   WISDOM TOOTH EXTRACTION      Outpatient Medications Prior to Visit  Medication Sig Dispense Refill   acetaminophen (TYLENOL) 500 MG tablet Take 500 mg by mouth as needed.     aspirin 81 MG tablet Take 81 mg by mouth daily.     atorvastatin (LIPITOR) 40 MG tablet Take 1 tablet (40 mg total) by mouth daily. 90 tablet 3   DULoxetine (CYMBALTA) 60 MG capsule Take 60 mg by mouth daily.     Lancets (ONETOUCH ULTRASOFT) lancets Check blood sugar once daily. Dx code: 250.00 100 each 12   LORazepam (ATIVAN) 0.5 MG tablet Take 0.5 mg by mouth as needed for anxiety.     losartan (COZAAR) 50 MG tablet Take 1 tablet (50 mg total) by mouth daily. 90 tablet 3   nortriptyline (PAMELOR) 10 MG capsule Take 1-2 capsules (10-20 mg total) by mouth at bedtime. 60 capsule 1   omeprazole (PRILOSEC) 20 MG capsule TAKE 1 CAPSULE BY MOUTH EVERY DAY 90 capsule 3   ONETOUCH ULTRA test strip USE 1 STRIP TWICE DAILY DX CODE E11.65 100 strip 3   promethazine (PHENERGAN) 12.5 MG tablet Take 1 tablet (12.5 mg total) by mouth every 8 (eight) hours as needed for nausea or vomiting. 20 tablet 8   Semaglutide, 2 MG/DOSE, (OZEMPIC, 2 MG/DOSE,) 8 MG/3ML SOPN Inject 2 mg into the skin once a week. Gets from pt assist 9 mL 3   tiZANidine (ZANAFLEX) 4 MG tablet TAKE 1 TABLET BY MOUTH AT BEDTIME. 90 tablet 1   traZODone (DESYREL) 50 MG tablet TAKE 1/2 TO 1 TABLET BY MOUTH AT BEDTIME AS NEEDED FOR SLEEP 90 tablet 1   vitamin B-12 (CYANOCOBALAMIN) 1000 MCG tablet Take 1,000 mcg by mouth daily.      Vitamin D, Ergocalciferol, (DRISDOL) 1.25 MG (50000 UNIT) CAPS capsule TAKE 1 CAPSULE BY MOUTH ONE TIME PER WEEK 12 capsule 0   busPIRone (BUSPAR) 15 MG tablet Take 15 mg by mouth 3 (three) times daily.     BD PEN NEEDLE NANO 2ND GEN 32G X 4 MM MISC USE ONCE A DAY AS DIRECTED (Patient not taking: Reported on 11/11/2022) 100  each 3   doxepin (SINEQUAN) 10 MG/ML solution  (Patient not taking: Reported on 11/11/2022)     nabumetone (RELAFEN) 500 MG tablet Take 1 tablet (500 mg total) by mouth daily. (Patient not taking: Reported on 11/11/2022) 30 tablet 0   No facility-administered medications prior to visit.    Allergies  Allergen Reactions   Vioxx [Rofecoxib] Other (See Comments)    Excess BP which caused TIA    Prednisone Other (See Comments)    Mental status changes with high-dose oral agent. Tolerates epidural steroid injections. No associated rash or fever   Macrodantin [Nitrofurantoin Macrocrystal] Hives   Sulfa Antibiotics     Hives   Colesevelam Other (See Comments)    Leg Pain   ROS neg/noncontributory except as noted HPI/below      Objective:     BP 110/82 (BP Location: Left Arm, Patient Position: Standing, Cuff Size: Normal)   Pulse (!) 115   Temp 97.8 F (36.6 C) (Temporal)   Ht '5\' 6"'$  (1.676 m)   Wt 141 lb 8 oz (64.2 kg)   LMP  (LMP Unknown)   SpO2 96%   BMI 22.84 kg/m  Wt Readings from Last 3 Encounters:  11/18/22 141 lb 8 oz (64.2 kg)  11/03/22 142 lb (64.4 kg)  10/27/22 140 lb (63.5 kg)    Physical Exam   Gen: WDWN NAD HEENT: NCAT, conjunctiva not injected, sclera nonicteric CARDIAC: RRR, S1S2+, no murmur. DP 2+B EXT:  no edema MSK: no gross abnormalities.  NEURO: A&O x3.  CN II-XII intact.  PSYCH: normal mood. Good eye contact     Assessment & Plan:   Problem List Items Addressed This Visit       Other   Tachycardia   Other Visit Diagnoses     Hypotension due to drugs    -  Primary      Hypotension-thought to be d/t wt loss and dose  of Losartan.  So off.  Occ bp high so pt nervous.  Continue to monitor.  Will do 12.5-'25mg'$  losartan but watch bp closely.  May also be dehydration-pt drinking more.  May be POTS, autonomic neuropathy, other.  Wear compression stockings as well. Tachycardia w/standing-?POTS, autonomic, dehydration-stay hydrated, compression stockings.  To card.   No orders of the defined types were placed in this encounter.   Wellington Hampshire, MD

## 2022-11-18 NOTE — Patient Instructions (Signed)
It was very nice to see you today!  Happy Holidays!  Get compression stockings.   Consider 1/2 tabs or 1/4 tabs losartan '50mg'$ .   PLEASE NOTE:  If you had any lab tests please let us know if you have not heard back within a few days. You may see your results on MyChart before we have a chance to review them but we will give you a call once they are reviewed by [place]. If we ordered any referrals today, please let us know if you have not heard from their office within the next week.   Please try these tips to maintain a healthy lifestyle:  Eat most of your calories during the day when you are active. Eliminate processed foods including packaged sweets (pies, cakes, cookies), reduce intake of potatoes, white bread, white pasta, and white rice. Look for whole grain options, oat flour or almond flour.  Each meal should contain half fruits/vegetables, one quarter protein, and one quarter carbs (no bigger than a computer mouse).  Cut down on sweet beverages. This includes juice, soda, and sweet tea. Also watch fruit intake, though this is a healthier sweet option, it still contains natural sugar! Limit to 3 servings daily.  Drink at least 1 glass of water with each meal and aim for at least 8 glasses per day  Exercise at least 150 minutes every week.

## 2022-11-19 ENCOUNTER — Ambulatory Visit (INDEPENDENT_AMBULATORY_CARE_PROVIDER_SITE_OTHER): Payer: Medicare Other | Admitting: Family Medicine

## 2022-11-19 VITALS — BP 132/78 | HR 92 | Ht 66.0 in | Wt 141.0 lb

## 2022-11-19 DIAGNOSIS — R238 Other skin changes: Secondary | ICD-10-CM | POA: Diagnosis not present

## 2022-11-19 DIAGNOSIS — R42 Dizziness and giddiness: Secondary | ICD-10-CM | POA: Diagnosis not present

## 2022-11-19 DIAGNOSIS — S060X0D Concussion without loss of consciousness, subsequent encounter: Secondary | ICD-10-CM | POA: Diagnosis not present

## 2022-11-19 NOTE — Therapy (Incomplete)
OUTPATIENT PHYSICAL THERAPY VESTIBULAR TREATMENT     Patient Name: Christine [name] MRN: [id number] DOB:[birth year], 71 y.o., female Today's Date: 11/19/2022  END OF SESSION:    Past Medical History:  Diagnosis Date   Acute bursitis of right shoulder 08/29/2018   Right shoulder injected in August 29, 2018   Allergic rhinitis    Closed displaced fracture of fifth metatarsal bone of left foot 03/16/2017   DEPRESSIVE DISORDER NOT ELSEWHERE CLASSIFIED 03/27/2010   Qualifier: Diagnosis of  By: [name] MD, [name]   [name], NP , [facility] [place]     Diabetes ([place]) 04/02/2016   Diabetes mellitus    Essential hypertension 03/14/2008   Qualifier: Diagnosis of  By: [name] MD, [name]     Fibromyalgia 08/12/2011   Diagnosed by [name] , Rheumatologist 2010    GERD (gastroesophageal reflux disease) 12/25/2016   Gluteal tendonitis of right buttock 10/19/2017   Injected in October 19, 2017 Injected October 07, 2018   Greater trochanteric bursitis of left hip 06/17/2016   Greater trochanteric bursitis of right hip 11/27/2015   Injected 11/27/2015    History of recurrent UTIs    [name]   HTN (hypertension)    Hx of osteopenia 10/11/2012   [facility]; ordered by [name] Lowest T score: -2.1 @ lumbar spine @ [facility] 11/01/12; decrease of 8.5% vs 05/2009. There is 9.2% risk over 10 yrs History fracture left elbow @ 6 Fractured left wrist , right elbow and nose post fall July/18/2013. Dr. [name] No FH Osteoporosis No PMH of bisphosphonate therapy (generic Fosamax Rxed but not filled  due to polypharmacy )    Hyperlipidemia    Hyperlipidemia associated with type 2 diabetes mellitus ([place]) 01/18/2007   Qualifier: Diagnosis of  By: [name]  With DM LDL goal = < 100, ideally < 70. Mi in father @ 35; 2 brothers @ 21 & 57     Lumbar radiculopathy 04/11/2008   Qualifier: Diagnosis of  By: [name] MD, Erick Blinks well to epidural 01/12/2017  repeat epidural given May  2018   Migraine headache    quiescent   Nonallopathic lesion of cervical region 07/19/2014   Nonallopathic lesion of lumbosacral region 01/15/2016   Nonallopathic lesion of sacral region 06/27/2018   Nonallopathic lesion-rib cage 09/26/2014   Palpitations 04/15/2011   Rotator cuff impingement syndrome of left shoulder 05/11/2014   Seasonal allergic rhinitis 03/21/2012   Sinusitis, chronic 04/20/2016   Sleep disorder    Dr Dohmier   Subluxation of peroneal tendon of right foot 11/10/2017   TIA (transient ischemic attack)    [place], HX OF 02/02/2008   Qualifier: Diagnosis of  By: Donalee Citrin ADVRS EFF UNS RX MEDICINAL&BIOLOGICAL [place] 02/02/2008   Qualifier: Diagnosis of  By: [name] MD, Posey Pronto AND MYOSITIS 02/02/2008   Qualifier: Diagnosis of  By: [name] MD, [name]     UTI (urinary tract infection) 10/05/2014   Viral upper respiratory tract infection with cough 12/31/2014   Vitamin D deficiency 05/25/2008   Qualifier: Diagnosis of  By: [name] MD, [name]     Past Surgical History:  Procedure Laterality Date   COLONOSCOPY     Dr Carlean [name]; hemorrhoids   CORONARY ANGIOPLASTY  12/15/1995   for chest pain- negative    KNEE ARTHROSCOPY Left 2023   POLYPECTOMY  12/14/2000   benign, hyperplastic polyp; rectal bleeding 2008   TONSILLECTOMY  TOTAL ABDOMINAL HYSTERECTOMY  12/14/1981   BSO for endometriosis   WISDOM TOOTH EXTRACTION     Patient Active Problem List   Diagnosis Date Noted   Calcification of soft tissue 10/12/2022   Rosacea 01/08/2022   Seborrheic keratosis 01/08/2022   Baker's cyst of knee, left 10/09/2021   Acute medial meniscus tear of left knee 09/02/2021   Bacteremia 08/15/2021   Bacteria in urine 08/07/2021   Urinary hesitancy 08/07/2021   Elevated troponin 08/07/2021   Diabetes mellitus type 2 in nonobese ([place]) 08/07/2021   Tachycardia 08/07/2021   Sepsis ([place]) 08/06/2021   Dizziness 04/22/2021   Arthritis  of right hip 02/12/2021   Leg swelling 07/12/2020   Cervical disc disorder with radiculopathy of cervical region 01/25/2020   Coronary atherosclerosis of native coronary artery 01/25/2020   Subluxation of peroneal tendon of right foot 11/10/2017   Lumbar radiculopathy, right 06/23/2017   Closed displaced fracture of fifth metatarsal bone of left foot 03/16/2017   GERD (gastroesophageal reflux disease) 12/25/2016   Migraine 05/06/2016   Type 2 diabetes mellitus with hyperglycemia, without long-term current use of insulin ([place]) 04/02/2016   Nonallopathic lesion of lumbosacral region 01/15/2016   Greater trochanteric bursitis of right hip 11/27/2015   Routine general medical examination at a health care facility 11/25/2015   Nonallopathic lesion of thoracic region 09/26/2014   Nonallopathic lesion-rib cage 09/26/2014   Nonallopathic lesion of cervical region 07/19/2014   Hx of osteopenia 10/11/2012   Seasonal allergic rhinitis 03/21/2012   Fibromyalgia 08/12/2011   Palpitations 04/15/2011   DEPRESSIVE DISORDER NOT ELSEWHERE CLASSIFIED 03/27/2010   Vitamin D deficiency 05/25/2008   Lumbar radiculopathy 04/11/2008   Essential hypertension 03/14/2008   History of cardiovascular disorder 02/02/2008   Hyperlipidemia associated with type 2 diabetes mellitus ([place]) 01/18/2007    PCP: [name], MD  REFERRING PROVIDER: Gregor Hams, MD  REFERRING DIAG: [phone] (ICD-10-CM) - Concussion without loss of consciousness, initial encounter R42 (ICD-10-CM) - [place]  THERAPY DIAG:  No diagnosis found.  ONSET DATE: 10/27/22  Rationale for Evaluation and Treatment: Rehabilitation  SUBJECTIVE:   SUBJECTIVE STATEMENT: Pt reports continued low BP measurements and notes it appears to be positional in nature. Pt reports she has been experiencing tinnitus since her fall which seemed to be   Pt accompanied by: self  PERTINENT HISTORY: DM, HTN, fibromyalgia, GERD, HTN, HLD, migraines, TIA,  L knee scope 2023  PAIN:  Are you having pain? Yes: NPRS scale: 7-8/10 Pain location: frontal HA Pain description: ache Aggravating factors: loud sounds Relieving factors: nothing  PRECAUTIONS: Fall  WEIGHT BEARING RESTRICTIONS: No  FALLS: Has patient fallen in last 6 months? Yes. Number of falls 2  LIVING ENVIRONMENT: Lives with: lives with their spouse Lives in: House/apartment Stairs:  2 steps with handrail; 1 story home  Has following equipment at home: Grab bars  PLOF: Independent  PATIENT GOALS: stand while singing in choir,   OBJECTIVE:     TODAY'S TREATMENT: 11/20/22 Activity Comments                        Below measures were taken at time of initial evaluation unless otherwise specified:   DIAGNOSTIC FINDINGS: 10/27/22 head/maxillofacial/neck CT:  No acute intracranial process. 2. No acute facial bone fractures. 3. No acute cervical spine fracture. 4. Multilevel cervical spondylosis and facet hypertrophy as above.  COGNITION: Overall cognitive status: Within functional limits for tasks assessed   SENSATION: WFL Reports occasional UE/LE N/T  at baseline   POSTURE:  forward head  Cervical ROM:    Active A/PROM (deg) eval  Flexion 42  Extension 30 *nonpainful crepitus   Right lateral flexion 33  Left lateral flexion 12  Right rotation 59  Left rotation 58  (Blank rows = not tested)   LOWER EXTREMITY MMT:   MMT Right eval Left eval  Hip flexion    Hip abduction    Hip adduction    Hip internal rotation    Hip external rotation    Knee flexion    Knee extension    Ankle dorsiflexion    Ankle plantarflexion    Ankle inversion    Ankle eversion    (Blank rows = not tested)   GAIT: Gait pattern:  slow and cautious, furniture walking Assistive device utilized: None Level of assistance: SBA and CGA   FUNCTIONAL TESTS:   NT d/t time  PATIENT SURVEYS:  FOTO 45  VESTIBULAR ASSESSMENT:  GENERAL OBSERVATION: pt wears  distance and reader contacts; neck resting in slight R lateral flexion   OCULOMOTOR EXAM:  Ocular Alignment: normal   Ocular ROM:  limited in inferior motion, particularly L eye  Spontaneous Nystagmus: absent  Gaze-Induced Nystagmus: absent  Smooth Pursuits:  difficulty in inferior gaze  Saccades:  difficulty maintaining gaze inferiorly; L eye staying in midline   Convergence/Divergence: 6 cm    VESTIBULAR - OCULAR REFLEX:   Slow VOR: Comment: c/o dizziness horizontal and vertical and c/o blurred vision  VOR Cancellation: woozy/symptomatic  Head-Impulse Test: -right; +left   POSITIONAL TESTING:   -Left/Right Roll Test: no nystagmus, no symptoms other than slight feeling when rolling left -Right/Left Dix-Hallpike: no nystagmus. Some symptoms when coming up from left DH  MOTION SENSITIVITY:  Motion Sensitivity Quotient Intensity: 0 = none, 1 = Lightheaded, 2 = Mild, 3 = Moderate, 4 = Severe, 5 = Vomiting  Intensity  1. Sitting to supine   2. Supine to L side   3. Supine to R side   4. Supine to sitting   5. L Hallpike-Dix   6. Up from L DH 2  7. R Hallpike-Dix 0  8. Up from R    9. Sitting, head tipped to L knee   10. Head up from L knee   11. Sitting, head tipped to R knee   12. Head up from R knee   13. Sitting head turns x5   14.Sitting head nods x5   15. In stance, 180 turn to L    16. In stance, 180 turn to R       Orthostatic Testing   Supine Sitting Standing  x1 Minute Standing x 3 Minutes  BP 146/87 117/83 *woozy 96/77 *woozy Error *woozy  HR 88 94 114 -       VESTIBULAR TREATMENT:                                                                                                   DATE: 11/11/22   PATIENT EDUCATION: Education details: Encouraged patient to call PCP now to see if she can  get f/u appointment d/t continued near-syncope; prognosis, POC, HEP Person educated: Patient Education method: Explanation, Demonstration, Tactile cues, Verbal cues,  and Handouts Education comprehension: verbalized understanding  HOME EXERCISE PROGRAM:  Access Code: [email or web address] URL: [email or web address] Date: 11/11/2022 Prepared by: [place] [facility]  Exercises - Seated Cervical Rotation AROM  - 1 x daily - 5 x weekly - 2 sets - 10 reps - Seated Cervical Sidebending AROM  - 1 x daily - 5 x weekly - 2 sets - 10 reps - Seated Cervical Flexion AROM  - 1 x daily - 5 x weekly - 2 sets - 10 reps - Seated Cervical Extension AROM  - 1 x daily - 5 x weekly - 2 sets - 10 reps - Corner Balance Feet Together With Eyes Open  - 1 x daily - 7 x weekly - 1-3 sets - 30 sec hold - Corner Balance Feet Together With Eyes Closed  - 1 x daily - 7 x weekly - 1-3 sets - 15-30 hold Patient Education - Orthostatic Hypotension  GOALS: Goals reviewed with patient? Yes  SHORT TERM GOALS: Target date: 12/02/2022  Patient to be independent with initial HEP. Baseline: HEP initiated Goal status: IN PROGRESS    LONG TERM GOALS: Target date: 01/06/2023  Patient to be independent with advanced HEP. Baseline: Not yet initiated  Goal status: IN PROGRESS  Patient to report 0/10 dizziness with standing vertical and horizontal VOR for 30 seconds. Baseline: Unable Goal status: IN PROGRESS  Patient will report 0/10 dizziness with bed mobility.  Baseline: Symptomatic  Goal status: IN PROGRESS  Patient to demonstrate mild-moderate sway with M-CTSIB condition with eyes closed/foam surface in order to improve safety in environments with uneven surfaces and dim lighting. Baseline: NT Goal status: IN PROGRESS  Patient to score at least 20/24 on DGI in order to decrease risk of falls. Baseline: NT Goal status: IN PROGRESS  Patient to report tolerance for standing to sing in the choir at church without symptoms limiting.  Baseline: Unable Goal status: IN PROGRESS  Patient to score at least 57 on FOTO in order to indicate improved  functional outcomes.  Baseline: 45 Goal status: IN PROGRESS     ASSESSMENT:  CLINICAL IMPRESSION: Pt reports she has been recording and cataloging her BP since last visit and has had additional episodes of orthostasis. Continued with vestibular/oculomotor assessment and reveals deficits with VOR and pronounced motion sensitivity to VOR cancellation as well as certain positions, most notably when the left ear is in a dependent position, e.g. rolling left and sitting up from left Dix-Hallpike.  Demonstrates balance and postural instability via M-CTSIB assessment with most pronounced deifcits under condition 4.  Continued with HEP development to include activities of habituation and gaze adaptation. Continued sessions to progress vestibular/concussion rehab.   OBJECTIVE IMPAIRMENTS: Abnormal gait, decreased activity tolerance, decreased balance, decreased ROM, dizziness, impaired flexibility, improper body mechanics, postural dysfunction, and pain.   ACTIVITY LIMITATIONS: lifting, bending, standing, squatting, stairs, transfers, bed mobility, bathing, toileting, dressing, reach over head, and hygiene/grooming  PARTICIPATION LIMITATIONS: meal prep, cleaning, laundry, shopping, community activity, yard work, and church  PERSONAL FACTORS: Age, Past/current experiences, Time since onset of injury/illness/exacerbation, and 3+ comorbidities: DM, HTN, fibromyalgia, GERD, HTN, HLD, migraines, TIA, L knee scope 2023  are also affecting patient's functional outcome.   REHAB POTENTIAL: Good  CLINICAL DECISION MAKING: Evolving/moderate complexity  EVALUATION COMPLEXITY: Moderate   PLAN:  PT FREQUENCY: 2x/week  PT DURATION: 8 weeks  PLANNED INTERVENTIONS: Therapeutic exercises, Therapeutic activity, Neuromuscular re-education, Balance training, Gait training, Patient/Family education, Self Care, Joint mobilization, Stair training, Vestibular training, Canalith repositioning, Aquatic Therapy, Dry  Needling, Electrical stimulation, Cryotherapy, Moist heat, Taping, Manual therapy, and Re-evaluation  PLAN FOR NEXT SESSION: position habituation? Vs gaze stabilization? Corner balance (with BP monitoring?)

## 2022-11-19 NOTE — Patient Instructions (Signed)
Thank you for coming in today.   Continue vestibular PT.   Continue nortriptyline.   I think the bumps on the heel are piezogenic papules. Gel heel cups would be a good starting point.   Recheck with me when you get back in Jan

## 2022-11-20 ENCOUNTER — Ambulatory Visit: Payer: Medicare Other | Admitting: Physical Therapy

## 2022-11-20 NOTE — Therapy (Signed)
OUTPATIENT PHYSICAL THERAPY VESTIBULAR TREATMENT     Patient Name: Christine [name] MRN: [id number] DOB:[birth year], 71 y.o., female Today's Date: 11/20/2022  END OF SESSION:    Past Medical History:  Diagnosis Date   Acute bursitis of right shoulder 08/29/2018   Right shoulder injected in August 29, 2018   Allergic rhinitis    Closed displaced fracture of fifth metatarsal bone of left foot 03/16/2017   DEPRESSIVE DISORDER NOT ELSEWHERE CLASSIFIED 03/27/2010   Qualifier: Diagnosis of  By: [name] MD, [name]   [name], NP , [facility] [place]     Diabetes ([place]) 04/02/2016   Diabetes mellitus    Essential hypertension 03/14/2008   Qualifier: Diagnosis of  By: [name] MD, [name]     Fibromyalgia 08/12/2011   Diagnosed by [name] , Rheumatologist 2010    GERD (gastroesophageal reflux disease) 12/25/2016   Gluteal tendonitis of right buttock 10/19/2017   Injected in October 19, 2017 Injected October 07, 2018   Greater trochanteric bursitis of left hip 06/17/2016   Greater trochanteric bursitis of right hip 11/27/2015   Injected 11/27/2015    History of recurrent UTIs    [name]   HTN (hypertension)    Hx of osteopenia 10/11/2012   [facility] DEXA; ordered by [name] Lowest T score: -2.1 @ lumbar spine @ [facility] 11/01/12; decrease of 8.5% vs 05/2009. There is 9.2% risk over 10 yrs History fracture left elbow @ 4 Fractured left wrist , right elbow and nose post fall July/18/2013. Dr. [name] No FH Osteoporosis No PMH of bisphosphonate therapy (generic Fosamax Rxed but not filled  due to polypharmacy )    Hyperlipidemia    Hyperlipidemia associated with type 2 diabetes mellitus ([place]) 01/18/2007   Qualifier: Diagnosis of  By: [name]  With DM LDL goal = < 100, ideally < 70. Mi in father @ 34; 2 brothers @ 24 & 19     Lumbar radiculopathy 04/11/2008   Qualifier: Diagnosis of  By: [name] MD, Erick Blinks well to epidural 01/12/2017  repeat epidural given May  2018   Migraine headache    quiescent   Nonallopathic lesion of cervical region 07/19/2014   Nonallopathic lesion of lumbosacral region 01/15/2016   Nonallopathic lesion of sacral region 06/27/2018   Nonallopathic lesion-rib cage 09/26/2014   Palpitations 04/15/2011   Rotator cuff impingement syndrome of left shoulder 05/11/2014   Seasonal allergic rhinitis 03/21/2012   Sinusitis, chronic 04/20/2016   Sleep disorder    Dr Dohmier   Subluxation of peroneal tendon of right foot 11/10/2017   TIA (transient ischemic attack)    [place], HX OF 02/02/2008   Qualifier: Diagnosis of  By: Donalee Citrin ADVRS EFF UNS RX MEDICINAL&BIOLOGICAL [place] 02/02/2008   Qualifier: Diagnosis of  By: [name] MD, Posey Pronto AND MYOSITIS 02/02/2008   Qualifier: Diagnosis of  By: [name] MD, [name]     UTI (urinary tract infection) 10/05/2014   Viral upper respiratory tract infection with cough 12/31/2014   Vitamin D deficiency 05/25/2008   Qualifier: Diagnosis of  By: [name] MD, [name]     Past Surgical History:  Procedure Laterality Date   COLONOSCOPY     Dr Carlean [name]; hemorrhoids   CORONARY ANGIOPLASTY  12/15/1995   for chest pain- negative    KNEE ARTHROSCOPY Left 2023   POLYPECTOMY  12/14/2000   benign, hyperplastic polyp; rectal bleeding 2008   TONSILLECTOMY  TOTAL ABDOMINAL HYSTERECTOMY  12/14/1981   BSO for endometriosis   WISDOM TOOTH EXTRACTION     Patient Active Problem List   Diagnosis Date Noted   Calcification of soft tissue 10/12/2022   Rosacea 01/08/2022   Seborrheic keratosis 01/08/2022   Baker's cyst of knee, left 10/09/2021   Acute medial meniscus tear of left knee 09/02/2021   Bacteremia 08/15/2021   Bacteria in urine 08/07/2021   Urinary hesitancy 08/07/2021   Elevated troponin 08/07/2021   Diabetes mellitus type 2 in nonobese ([place]) 08/07/2021   Tachycardia 08/07/2021   Sepsis ([place]) 08/06/2021   Dizziness 04/22/2021   Arthritis  of right hip 02/12/2021   Leg swelling 07/12/2020   Cervical disc disorder with radiculopathy of cervical region 01/25/2020   Coronary atherosclerosis of native coronary artery 01/25/2020   Subluxation of peroneal tendon of right foot 11/10/2017   Lumbar radiculopathy, right 06/23/2017   Closed displaced fracture of fifth metatarsal bone of left foot 03/16/2017   GERD (gastroesophageal reflux disease) 12/25/2016   Migraine 05/06/2016   Type 2 diabetes mellitus with hyperglycemia, without long-term current use of insulin ([place]) 04/02/2016   Nonallopathic lesion of lumbosacral region 01/15/2016   Greater trochanteric bursitis of right hip 11/27/2015   Routine general medical examination at a health care facility 11/25/2015   Nonallopathic lesion of thoracic region 09/26/2014   Nonallopathic lesion-rib cage 09/26/2014   Nonallopathic lesion of cervical region 07/19/2014   Hx of osteopenia 10/11/2012   Seasonal allergic rhinitis 03/21/2012   Fibromyalgia 08/12/2011   Palpitations 04/15/2011   DEPRESSIVE DISORDER NOT ELSEWHERE CLASSIFIED 03/27/2010   Vitamin D deficiency 05/25/2008   Lumbar radiculopathy 04/11/2008   Essential hypertension 03/14/2008   History of cardiovascular disorder 02/02/2008   Hyperlipidemia associated with type 2 diabetes mellitus ([place]) 01/18/2007    PCP: [name], MD  REFERRING PROVIDER: Gregor Hams, MD  REFERRING DIAG: [phone] (ICD-10-CM) - Concussion without loss of consciousness, initial encounter R42 (ICD-10-CM) - [place]  THERAPY DIAG:  No diagnosis found.  ONSET DATE: 10/27/22  Rationale for Evaluation and Treatment: Rehabilitation  SUBJECTIVE:   SUBJECTIVE STATEMENT: Pt reports continued low BP measurements and notes it appears to be positional in nature. Pt reports she has been experiencing tinnitus since her fall which seemed to be   Pt accompanied by: self  PERTINENT HISTORY: DM, HTN, fibromyalgia, GERD, HTN, HLD, migraines, TIA,  L knee scope 2023  PAIN:  Are you having pain? Yes: NPRS scale: 7-8/10 Pain location: frontal HA Pain description: ache Aggravating factors: loud sounds Relieving factors: nothing  PRECAUTIONS: Fall  WEIGHT BEARING RESTRICTIONS: No  FALLS: Has patient fallen in last 6 months? Yes. Number of falls 2  LIVING ENVIRONMENT: Lives with: lives with their spouse Lives in: House/apartment Stairs:  2 steps with handrail; 1 story home  Has following equipment at home: Grab bars  PLOF: Independent  PATIENT GOALS: stand while singing in choir,   OBJECTIVE:     TODAY'S TREATMENT: 11/23/22 Activity Comments                        Below measures were taken at time of initial evaluation unless otherwise specified:   DIAGNOSTIC FINDINGS: 10/27/22 head/maxillofacial/neck CT:  No acute intracranial process. 2. No acute facial bone fractures. 3. No acute cervical spine fracture. 4. Multilevel cervical spondylosis and facet hypertrophy as above.  COGNITION: Overall cognitive status: Within functional limits for tasks assessed   SENSATION: WFL Reports occasional UE/LE N/T  at baseline   POSTURE:  forward head  Cervical ROM:    Active A/PROM (deg) eval  Flexion 42  Extension 30 *nonpainful crepitus   Right lateral flexion 33  Left lateral flexion 12  Right rotation 59  Left rotation 58  (Blank rows = not tested)   LOWER EXTREMITY MMT:   MMT Right eval Left eval  Hip flexion    Hip abduction    Hip adduction    Hip internal rotation    Hip external rotation    Knee flexion    Knee extension    Ankle dorsiflexion    Ankle plantarflexion    Ankle inversion    Ankle eversion    (Blank rows = not tested)   GAIT: Gait pattern:  slow and cautious, furniture walking Assistive device utilized: None Level of assistance: SBA and CGA   FUNCTIONAL TESTS:   NT d/t time  PATIENT SURVEYS:  FOTO 45  VESTIBULAR ASSESSMENT:  GENERAL OBSERVATION: pt wears  distance and reader contacts; neck resting in slight R lateral flexion   OCULOMOTOR EXAM:  Ocular Alignment: normal   Ocular ROM:  limited in inferior motion, particularly L eye  Spontaneous Nystagmus: absent  Gaze-Induced Nystagmus: absent  Smooth Pursuits:  difficulty in inferior gaze  Saccades:  difficulty maintaining gaze inferiorly; L eye staying in midline   Convergence/Divergence: 6 cm    VESTIBULAR - OCULAR REFLEX:   Slow VOR: Comment: c/o dizziness horizontal and vertical and c/o blurred vision  VOR Cancellation: woozy/symptomatic  Head-Impulse Test: -right; +left   POSITIONAL TESTING:   -Left/Right Roll Test: no nystagmus, no symptoms other than slight feeling when rolling left -Right/Left Dix-Hallpike: no nystagmus. Some symptoms when coming up from left DH  MOTION SENSITIVITY:  Motion Sensitivity Quotient Intensity: 0 = none, 1 = Lightheaded, 2 = Mild, 3 = Moderate, 4 = Severe, 5 = Vomiting  Intensity  1. Sitting to supine   2. Supine to L side   3. Supine to R side   4. Supine to sitting   5. L Hallpike-Dix   6. Up from L DH 2  7. R Hallpike-Dix 0  8. Up from R    9. Sitting, head tipped to L knee   10. Head up from L knee   11. Sitting, head tipped to R knee   12. Head up from R knee   13. Sitting head turns x5   14.Sitting head nods x5   15. In stance, 180 turn to L    16. In stance, 180 turn to R       Orthostatic Testing   Supine Sitting Standing  x1 Minute Standing x 3 Minutes  BP 146/87 117/83 *woozy 96/77 *woozy Error *woozy  HR 88 94 114 -       VESTIBULAR TREATMENT:                                                                                                   DATE: 11/11/22   PATIENT EDUCATION: Education details: Encouraged patient to call PCP now to see if she can  get f/u appointment d/t continued near-syncope; prognosis, POC, HEP Person educated: Patient Education method: Explanation, Demonstration, Tactile cues, Verbal cues,  and Handouts Education comprehension: verbalized understanding  HOME EXERCISE PROGRAM:  Access Code: [email or web address] URL: [email or web address] Date: 11/11/2022 Prepared by: [facility]  Exercises - Seated Cervical Rotation AROM  - 1 x daily - 5 x weekly - 2 sets - 10 reps - Seated Cervical Sidebending AROM  - 1 x daily - 5 x weekly - 2 sets - 10 reps - Seated Cervical Flexion AROM  - 1 x daily - 5 x weekly - 2 sets - 10 reps - Seated Cervical Extension AROM  - 1 x daily - 5 x weekly - 2 sets - 10 reps - Corner Balance Feet Together With Eyes Open  - 1 x daily - 7 x weekly - 1-3 sets - 30 sec hold - Corner Balance Feet Together With Eyes Closed  - 1 x daily - 7 x weekly - 1-3 sets - 15-30 hold Patient Education - Orthostatic Hypotension  GOALS: Goals reviewed with patient? Yes  SHORT TERM GOALS: Target date: 12/02/2022  Patient to be independent with initial HEP. Baseline: HEP initiated Goal status: IN PROGRESS    LONG TERM GOALS: Target date: 01/06/2023  Patient to be independent with advanced HEP. Baseline: Not yet initiated  Goal status: IN PROGRESS  Patient to report 0/10 dizziness with standing vertical and horizontal VOR for 30 seconds. Baseline: Unable Goal status: IN PROGRESS  Patient will report 0/10 dizziness with bed mobility.  Baseline: Symptomatic  Goal status: IN PROGRESS  Patient to demonstrate mild-moderate sway with M-CTSIB condition with eyes closed/foam surface in order to improve safety in environments with uneven surfaces and dim lighting. Baseline: NT Goal status: IN PROGRESS  Patient to score at least 20/24 on DGI in order to decrease risk of falls. Baseline: NT Goal status: IN PROGRESS  Patient to report tolerance for standing to sing in the choir at church without symptoms limiting.  Baseline: Unable Goal status: IN PROGRESS  Patient to score at least 57 on FOTO in order to indicate improved  functional outcomes.  Baseline: 45 Goal status: IN PROGRESS     ASSESSMENT:  CLINICAL IMPRESSION: Pt reports she has been recording and cataloging her BP since last visit and has had additional episodes of orthostasis. Continued with vestibular/oculomotor assessment and reveals deficits with VOR and pronounced motion sensitivity to VOR cancellation as well as certain positions, most notably when the left ear is in a dependent position, e.g. rolling left and sitting up from left Dix-Hallpike.  Demonstrates balance and postural instability via M-CTSIB assessment with most pronounced deifcits under condition 4.  Continued with HEP development to include activities of habituation and gaze adaptation. Continued sessions to progress vestibular/concussion rehab.   OBJECTIVE IMPAIRMENTS: Abnormal gait, decreased activity tolerance, decreased balance, decreased ROM, dizziness, impaired flexibility, improper body mechanics, postural dysfunction, and pain.   ACTIVITY LIMITATIONS: lifting, bending, standing, squatting, stairs, transfers, bed mobility, bathing, toileting, dressing, reach over head, and hygiene/grooming  PARTICIPATION LIMITATIONS: meal prep, cleaning, laundry, shopping, community activity, yard work, and church  PERSONAL FACTORS: Age, Past/current experiences, Time since onset of injury/illness/exacerbation, and 3+ comorbidities: DM, HTN, fibromyalgia, GERD, HTN, HLD, migraines, TIA, L knee scope 2023  are also affecting patient's functional outcome.   REHAB POTENTIAL: Good  CLINICAL DECISION MAKING: Evolving/moderate complexity  EVALUATION COMPLEXITY: Moderate   PLAN:  PT FREQUENCY: 2x/week  PT DURATION: 8 weeks  PLANNED INTERVENTIONS: Therapeutic exercises, Therapeutic activity, Neuromuscular re-education, Balance training, Gait training, Patient/Family education, Self Care, Joint mobilization, Stair training, Vestibular training, Canalith repositioning, Aquatic Therapy, Dry  Needling, Electrical stimulation, Cryotherapy, Moist heat, Taping, Manual therapy, and Re-evaluation  PLAN FOR NEXT SESSION: position habituation? Vs gaze stabilization? Corner balance (with BP monitoring?)

## 2022-11-23 ENCOUNTER — Encounter: Payer: Self-pay | Admitting: Physical Therapy

## 2022-11-23 ENCOUNTER — Ambulatory Visit: Payer: Medicare Other | Admitting: Physical Therapy

## 2022-11-23 DIAGNOSIS — R2681 Unsteadiness on feet: Secondary | ICD-10-CM | POA: Diagnosis not present

## 2022-11-23 DIAGNOSIS — R42 Dizziness and giddiness: Secondary | ICD-10-CM | POA: Diagnosis not present

## 2022-11-23 DIAGNOSIS — G44319 Acute post-traumatic headache, not intractable: Secondary | ICD-10-CM

## 2022-11-25 ENCOUNTER — Other Ambulatory Visit: Payer: Self-pay | Admitting: Family Medicine

## 2022-11-25 ENCOUNTER — Ambulatory Visit: Payer: Medicare Other

## 2022-11-25 ENCOUNTER — Encounter: Payer: Self-pay | Admitting: Family Medicine

## 2022-11-25 DIAGNOSIS — R42 Dizziness and giddiness: Secondary | ICD-10-CM

## 2022-11-25 DIAGNOSIS — R2681 Unsteadiness on feet: Secondary | ICD-10-CM | POA: Diagnosis not present

## 2022-11-25 DIAGNOSIS — G44319 Acute post-traumatic headache, not intractable: Secondary | ICD-10-CM

## 2022-11-25 NOTE — Therapy (Signed)
OUTPATIENT PHYSICAL THERAPY VESTIBULAR TREATMENT     Patient Name: Christine [name] MRN: [id number] DOB:[birth year], 71 y.o., female Today's Date: 11/25/2022  END OF SESSION:  PT End of Session - 11/25/22 1311     Visit Number 4    Number of Visits 17    Date for PT Re-Evaluation 01/06/23    Authorization Type Medicare    PT Start Time 1315    PT Stop Time 1400    PT Time Calculation (min) 45 min    Equipment Utilized During Treatment Gait belt    Activity Tolerance Patient tolerated treatment well;Other (comment)   lightheadedness   Behavior During Therapy [facility] for tasks assessed/performed              Past Medical History:  Diagnosis Date   Acute bursitis of right shoulder 08/29/2018   Right shoulder injected in August 29, 2018   Allergic rhinitis    Closed displaced fracture of fifth metatarsal bone of left foot 03/16/2017   DEPRESSIVE DISORDER NOT ELSEWHERE CLASSIFIED 03/27/2010   Qualifier: Diagnosis of  By: [name] MD, [name]   [name], NP , [facility] [place]     Diabetes ([place]) 04/02/2016   Diabetes mellitus    Essential hypertension 03/14/2008   Qualifier: Diagnosis of  By: [name] MD, [name]     Fibromyalgia 08/12/2011   Diagnosed by [name] , Rheumatologist 2010    GERD (gastroesophageal reflux disease) 12/25/2016   Gluteal tendonitis of right buttock 10/19/2017   Injected in October 19, 2017 Injected October 07, 2018   Greater trochanteric bursitis of left hip 06/17/2016   Greater trochanteric bursitis of right hip 11/27/2015   Injected 11/27/2015    History of recurrent UTIs    [name]   HTN (hypertension)    Hx of osteopenia 10/11/2012   [facility] DEXA; ordered by [name] Lowest T score: -2.1 @ lumbar spine @ [facility] 11/01/12; decrease of 8.5% vs 05/2009. There is 9.2% risk over 10 yrs History fracture left elbow @ 4 Fractured left wrist , right elbow and nose post fall July/18/2013. Dr. [name] No FH Osteoporosis No PMH of  bisphosphonate therapy (generic Fosamax Rxed but not filled  due to polypharmacy )    Hyperlipidemia    Hyperlipidemia associated with type 2 diabetes mellitus ([place]) 01/18/2007   Qualifier: Diagnosis of  By: [name]  With DM LDL goal = < 100, ideally < 70. Mi in father @ 51; 2 brothers @ 45 & 65     Lumbar radiculopathy 04/11/2008   Qualifier: Diagnosis of  By: [name] MD, Erick Blinks well to epidural 01/12/2017  repeat epidural given May 2018   Migraine headache    quiescent   Nonallopathic lesion of cervical region 07/19/2014   Nonallopathic lesion of lumbosacral region 01/15/2016   Nonallopathic lesion of sacral region 06/27/2018   Nonallopathic lesion-rib cage 09/26/2014   Palpitations 04/15/2011   Rotator cuff impingement syndrome of left shoulder 05/11/2014   Seasonal allergic rhinitis 03/21/2012   Sinusitis, chronic 04/20/2016   Sleep disorder    Dr Dohmier   Subluxation of peroneal tendon of right foot 11/10/2017   TIA (transient ischemic attack)    [place], HX OF 02/02/2008   Qualifier: Diagnosis of  By: Donalee Citrin ADVRS EFF UNS RX MEDICINAL&BIOLOGICAL [place] 02/02/2008   Qualifier: Diagnosis of  By: [name] MD, Posey Pronto AND MYOSITIS 02/02/2008  Qualifier: Diagnosis of  By: [name] MD, [name]     UTI (urinary tract infection) 10/05/2014   Viral upper respiratory tract infection with cough 12/31/2014   Vitamin D deficiency 05/25/2008   Qualifier: Diagnosis of  By: [name] MD, [name]     Past Surgical History:  Procedure Laterality Date   COLONOSCOPY     Dr Carlean [name]; hemorrhoids   CORONARY ANGIOPLASTY  12/15/1995   for chest pain- negative    KNEE ARTHROSCOPY Left 2023   POLYPECTOMY  12/14/2000   benign, hyperplastic polyp; rectal bleeding 2008   TONSILLECTOMY     TOTAL ABDOMINAL HYSTERECTOMY  12/14/1981   BSO for endometriosis   WISDOM TOOTH EXTRACTION     Patient Active Problem List   Diagnosis Date Noted    Calcification of soft tissue 10/12/2022   Rosacea 01/08/2022   Seborrheic keratosis 01/08/2022   Baker's cyst of knee, left 10/09/2021   Acute medial meniscus tear of left knee 09/02/2021   Bacteremia 08/15/2021   Bacteria in urine 08/07/2021   Urinary hesitancy 08/07/2021   Elevated troponin 08/07/2021   Diabetes mellitus type 2 in nonobese ([place]) 08/07/2021   Tachycardia 08/07/2021   Sepsis ([place]) 08/06/2021   Dizziness 04/22/2021   Arthritis of right hip 02/12/2021   Leg swelling 07/12/2020   Cervical disc disorder with radiculopathy of cervical region 01/25/2020   Coronary atherosclerosis of native coronary artery 01/25/2020   Subluxation of peroneal tendon of right foot 11/10/2017   Lumbar radiculopathy, right 06/23/2017   Closed displaced fracture of fifth metatarsal bone of left foot 03/16/2017   GERD (gastroesophageal reflux disease) 12/25/2016   Migraine 05/06/2016   Type 2 diabetes mellitus with hyperglycemia, without long-term current use of insulin ([place]) 04/02/2016   Nonallopathic lesion of lumbosacral region 01/15/2016   Greater trochanteric bursitis of right hip 11/27/2015   Routine general medical examination at a health care facility 11/25/2015   Nonallopathic lesion of thoracic region 09/26/2014   Nonallopathic lesion-rib cage 09/26/2014   Nonallopathic lesion of cervical region 07/19/2014   Hx of osteopenia 10/11/2012   Seasonal allergic rhinitis 03/21/2012   Fibromyalgia 08/12/2011   Palpitations 04/15/2011   DEPRESSIVE DISORDER NOT ELSEWHERE CLASSIFIED 03/27/2010   Vitamin D deficiency 05/25/2008   Lumbar radiculopathy 04/11/2008   Essential hypertension 03/14/2008   History of cardiovascular disorder 02/02/2008   Hyperlipidemia associated with type 2 diabetes mellitus ([place]) 01/18/2007    PCP: [name], MD  REFERRING PROVIDER: Gregor Hams, MD  REFERRING DIAG: [phone] (ICD-10-CM) - Concussion without loss of consciousness, initial  encounter R42 (ICD-10-CM) - Dizzy  THERAPY DIAG:  Dizziness and giddiness  Unsteadiness on feet  Acute post-traumatic headache, not intractable  ONSET DATE: 10/27/22  Rationale for Evaluation and Treatment: Rehabilitation  SUBJECTIVE:   SUBJECTIVE STATEMENT: Symptomatic with standing. BP medication has been changed and the readings at home have been variable, "some high, some low". Has a HA at this time also  Pt accompanied by: self  PERTINENT HISTORY: DM, HTN, fibromyalgia, GERD, HTN, HLD, migraines, TIA, L knee scope 2023  PAIN:  Are you having pain? Yes: NPRS scale: 7-8/10 Pain location: frontal HA Pain description: ache Aggravating factors: loud sounds Relieving factors: nothing  PRECAUTIONS: Fall  WEIGHT BEARING RESTRICTIONS: No  FALLS: Has patient fallen in last 6 months? Yes. Number of falls 2  LIVING ENVIRONMENT: Lives with: lives with their spouse Lives in: House/apartment Stairs:  2 steps with handrail; 1 story home  Has following equipment at home: Grab bars  PLOF: Independent  PATIENT GOALS: stand while singing in choir,   OBJECTIVE:   Vitals: 123/84, 98 bpm (seated) --Attempted in standing x 2 trials, unable to read Error--feeling symptomatic Vitals mid-session: 116/86, 103 bpm (seated) Vitals in standing after seated exercise: 109/83, 116 bpm     TODAY'S TREATMENT: 11/25/22 Activity Comments  Initiated with monitoring vitals   Sitting head turns to target 2x30" Cues for accuracy vs speed and symptoms for guide  Sitting head nods to target 2x30"   Seated VOR x 1  Horizontal, notes dizziness Vertical, notes dizziness  sitting VOR cancellation 2x30"             TODAY'S TREATMENT: 11/23/22 Activity Comments  Vitals at start of session 98% spO2, 95 bpm, 121/84 mmHg  Encouraged siting marching before standing    DGI 19/24  sitting VOR cancellation 2x30" Cueing to increase speed; c/o lightheaded/borderline dizzy at quicker speed which was  poorly tolerated   sitting head turns to targets 2x30" C/o 2-3/10 "lightheaded" upon stopping  sitting head nods to targets 2x30" Slower speed d/t stronger sx than head turns; c/o 5/10 dizziness; encouraged slower pace          HOME EXERCISE PROGRAM Last updated: 11/24/22 Access Code: [email or web address] URL: [email or web address] Date: 11/23/2022 Prepared by: [facility]  Exercises - Seated Cervical Rotation AROM  - 1 x daily - 5 x weekly - 2 sets - 10 reps - Seated Cervical Sidebending AROM  - 1 x daily - 5 x weekly - 2 sets - 10 reps - Seated Cervical Flexion AROM  - 1 x daily - 5 x weekly - 2 sets - 10 reps - Seated Cervical Extension AROM  - 1 x daily - 5 x weekly - 2 sets - 10 reps - Seated VOR Cancellation  - 1 x daily - 7 x weekly - 3-5 sets - 30 sec hold - Seated Left Head Turns Vestibular Habituation  - 1 x daily - 5 x weekly - 2-3 sets - 30 sec hold - Seated Head Nod Vestibular Habituation  - 1 x daily - 5 x weekly - 2-3 sets - 30 sec hold   PATIENT EDUCATION: Education details: discussion on patient's symptoms; advised to discontinue standing exercises d/t safety concern; updated HEP to include seated exercises only Person educated: Patient Education method: Explanation, Demonstration, Tactile cues, Verbal cues, and Handouts Education comprehension: verbalized understanding and returned demonstration   Below measures were taken at time of initial evaluation unless otherwise specified:   DIAGNOSTIC FINDINGS: 10/27/22 head/maxillofacial/neck CT:  No acute intracranial process. 2. No acute facial bone fractures. 3. No acute cervical spine fracture. 4. Multilevel cervical spondylosis and facet hypertrophy as above.  COGNITION: Overall cognitive status: Within functional limits for tasks assessed   SENSATION: WFL Reports occasional UE/LE N/T at baseline   POSTURE:  forward head  Cervical ROM:    Active A/PROM  (deg) eval  Flexion 42  Extension 30 *nonpainful crepitus   Right lateral flexion 33  Left lateral flexion 12  Right rotation 59  Left rotation 58  (Blank rows = not tested)   LOWER EXTREMITY MMT:   MMT Right eval Left eval  Hip flexion    Hip abduction    Hip adduction    Hip internal rotation    Hip external rotation    Knee flexion    Knee extension    Ankle dorsiflexion    Ankle plantarflexion    Ankle  inversion    Ankle eversion    (Blank rows = not tested)   GAIT: Gait pattern:  slow and cautious, furniture walking Assistive device utilized: None Level of assistance: SBA and CGA   FUNCTIONAL TESTS:   NT d/t time  PATIENT SURVEYS:  FOTO 45  VESTIBULAR ASSESSMENT:  GENERAL OBSERVATION: pt wears distance and reader contacts; neck resting in slight R lateral flexion   OCULOMOTOR EXAM:  Ocular Alignment: normal   Ocular ROM:  limited in inferior motion, particularly L eye  Spontaneous Nystagmus: absent  Gaze-Induced Nystagmus: absent  Smooth Pursuits:  difficulty in inferior gaze  Saccades:  difficulty maintaining gaze inferiorly; L eye staying in midline   Convergence/Divergence: 6 cm    VESTIBULAR - OCULAR REFLEX:   Slow VOR: Comment: c/o dizziness horizontal and vertical and c/o blurred vision  VOR Cancellation: woozy/symptomatic  Head-Impulse Test: -right; +left   POSITIONAL TESTING:   -Left/Right Roll Test: no nystagmus, no symptoms other than slight feeling when rolling left -Right/Left Dix-Hallpike: no nystagmus. Some symptoms when coming up from left DH  MOTION SENSITIVITY:  Motion Sensitivity Quotient Intensity: 0 = none, 1 = Lightheaded, 2 = Mild, 3 = Moderate, 4 = Severe, 5 = Vomiting  Intensity  1. Sitting to supine   2. Supine to L side   3. Supine to R side   4. Supine to sitting   5. L Hallpike-Dix   6. Up from L DH 2  7. R Hallpike-Dix 0  8. Up from R    9. Sitting, head tipped to L knee   10. Head up from L knee   11.  Sitting, head tipped to R knee   12. Head up from R knee   13. Sitting head turns x5   14.Sitting head nods x5   15. In stance, 180 turn to L    16. In stance, 180 turn to R       Orthostatic Testing   Supine Sitting Standing  x1 Minute Standing x 3 Minutes  BP 146/87 117/83 *woozy 96/77 *woozy Error *woozy  HR 88 94 114 -       VESTIBULAR TREATMENT:                                                                                                   DATE: 11/11/22   PATIENT EDUCATION: Education details: Encouraged patient to call PCP now to see if she can get f/u appointment d/t continued near-syncope; prognosis, POC, HEP Person educated: Patient Education method: Explanation, Demonstration, Tactile cues, Verbal cues, and Handouts Education comprehension: verbalized understanding  HOME EXERCISE PROGRAM:  Access Code: [email or web address] URL: [email or web address] Date: 11/11/2022 Prepared by: [facility]  Exercises - Seated Cervical Rotation AROM  - 1 x daily - 5 x weekly - 2 sets - 10 reps - Seated Cervical Sidebending AROM  - 1 x daily - 5 x weekly - 2 sets - 10 reps - Seated Cervical Flexion AROM  - 1 x daily - 5 x weekly - 2 sets - 10 reps -  Seated Cervical Extension AROM  - 1 x daily - 5 x weekly - 2 sets - 10 reps - Corner Balance Feet Together With Eyes Open  - 1 x daily - 7 x weekly - 1-3 sets - 30 sec hold - Corner Balance Feet Together With Eyes Closed  - 1 x daily - 7 x weekly - 1-3 sets - 15-30 hold Patient Education - Orthostatic Hypotension  GOALS: Goals reviewed with patient? Yes  SHORT TERM GOALS: Target date: 12/02/2022  Patient to be independent with initial HEP. Baseline: HEP initiated Goal status: IN PROGRESS    LONG TERM GOALS: Target date: 01/06/2023  Patient to be independent with advanced HEP. Baseline: Not yet initiated  Goal status: IN PROGRESS  Patient to report 0/10 dizziness with standing  vertical and horizontal VOR for 30 seconds. Baseline: Unable Goal status: IN PROGRESS  Patient will report 0/10 dizziness with bed mobility.  Baseline: Symptomatic  Goal status: IN PROGRESS  Patient to demonstrate mild-moderate sway with M-CTSIB condition with eyes closed/foam surface in order to improve safety in environments with uneven surfaces and dim lighting. Baseline: NT Goal status: IN PROGRESS  Patient to score at least 20/24 on DGI in order to decrease risk of falls. Baseline: NT Goal status: IN PROGRESS  Patient to report tolerance for standing to sing in the choir at church without symptoms limiting.  Baseline: Unable Goal status: IN PROGRESS  Patient to score at least 57 on FOTO in order to indicate improved functional outcomes.  Baseline: 45 Goal status: IN PROGRESS     ASSESSMENT:  CLINICAL IMPRESSION: Demo deficits with vestibular activities in sitting with symptoms provoked during VOR x 1 and cancellation as well as head and visual fixation.  Denies positional symptoms when laying or sitting. Symptomatic in standing with lightheaded/dizzy sensation. BP monitored throughout session. Addition of VOR x 1 for HEP to address symptoms. Continued sessions when pt returns from trip   OBJECTIVE IMPAIRMENTS: Abnormal gait, decreased activity tolerance, decreased balance, decreased ROM, dizziness, impaired flexibility, improper body mechanics, postural dysfunction, and pain.   ACTIVITY LIMITATIONS: lifting, bending, standing, squatting, stairs, transfers, bed mobility, bathing, toileting, dressing, reach over head, and hygiene/grooming  PARTICIPATION LIMITATIONS: meal prep, cleaning, laundry, shopping, community activity, yard work, and church  PERSONAL FACTORS: Age, Past/current experiences, Time since onset of injury/illness/exacerbation, and 3+ comorbidities: DM, HTN, fibromyalgia, GERD, HTN, HLD, migraines, TIA, L knee scope 2023  are also affecting patient's  functional outcome.   REHAB POTENTIAL: Good  CLINICAL DECISION MAKING: Evolving/moderate complexity  EVALUATION COMPLEXITY: Moderate   PLAN:  PT FREQUENCY: 2x/week  PT DURATION: 8 weeks  PLANNED INTERVENTIONS: Therapeutic exercises, Therapeutic activity, Neuromuscular re-education, Balance training, Gait training, Patient/Family education, Self Care, Joint mobilization, Stair training, Vestibular training, Canalith repositioning, Aquatic Therapy, Dry Needling, Electrical stimulation, Cryotherapy, Moist heat, Taping, Manual therapy, and Re-evaluation  PLAN FOR NEXT SESSION: position habituation? Vs gaze stabilization? Corner balance (with BP monitoring?)  1:55 PM, 11/25/22 M. Sherlyn Lees, PT, DPT Physical Therapist- [place] Office Number: [phone]   [facility] [facility] [address], [place], [place] [address] Phone # [phone] Fax # [phone]

## 2022-11-25 NOTE — Telephone Encounter (Signed)
Rx refill request approved per Dr. Corey's orders. 

## 2022-11-26 ENCOUNTER — Other Ambulatory Visit: Payer: Self-pay | Admitting: *Deleted

## 2022-11-26 MED ORDER — METOPROLOL SUCCINATE ER 25 MG PO TB24: 12.5000 mg | ORAL_TABLET | Freq: Every day | ORAL | 1 refills | Status: DC

## 2022-11-26 NOTE — Progress Notes (Signed)
Office Visit    Patient Name: Christine [name] Date of Encounter: 11/27/2022  PCP:  [name], MD   [facility]  Cardiologist:  Dorris Carnes, MD  Advanced Practice Provider:  No care team member to display Electrophysiologist:  None   HPI    Christine [name] is a 71 y.o. female with past medical history significant for asymptomatic CAD, DM 2, hyperlipidemia, TIA, and palpitations presents today for annual follow-up appointment.  The patient had sepsis August 2022 which was related to urinary tract infection.  Hospitalized for several days.  She was discharged and was feeling short of breath, weak, and shaky.  She felt her heart rate was significantly increasing.  Episodes occurred when she was laying or sitting.  They would last less than a minute.  On her pulse ox on monitor she could measure her heart rate and it was as fast as 145 bpm.  She wore a monitor August 2022 which showed nonsustained SVT rates less than 150 bpm, PACs/PVCs burden less than 1%, normal sinus rhythm was the predominant rhythm.  Today, she states she gets dizziness when standing at times. She has been trying to go slowly with transitions however, she did have a fall where she hit her face. She states she fell to her knees and then hit her face on the floor from there. Her PCP added metoprolol to her medications and decreased her Cozaar. Orthostatics were done today in the office and were positive. The patient endorse dizziness when standing today. We discussed increasing water and limiting caffeine. She has been drinking some no caffeine soda and maybe two bottle of water daily. I also asked her to increase her sodium and wear compression socks which she is wearing today. She has a BP cuff and I've asked her to track this daily. She is suppose to go out of town in a week and is worried about falling. We will track her BP and HR over the next week and see if she is feeling better with the  medication changes.  Reports no shortness of breath nor dyspnea on exertion. Reports no chest pain, pressure, or tightness. No edema, orthopnea, PND.     Past Medical History    Past Medical History:  Diagnosis Date   Acute bursitis of right shoulder 08/29/2018   Right shoulder injected in August 29, 2018   Allergic rhinitis    Closed displaced fracture of fifth metatarsal bone of left foot 03/16/2017   DEPRESSIVE DISORDER NOT ELSEWHERE CLASSIFIED 03/27/2010   Qualifier: Diagnosis of  By: [name] MD, [name]   [name], NP , [facility] [place]     Diabetes ([place]) 04/02/2016   Diabetes mellitus    Essential hypertension 03/14/2008   Qualifier: Diagnosis of  By: [name] MD, [name]     Fibromyalgia 08/12/2011   Diagnosed by [name] , Rheumatologist 2010    GERD (gastroesophageal reflux disease) 12/25/2016   Gluteal tendonitis of right buttock 10/19/2017   Injected in October 19, 2017 Injected October 07, 2018   Greater trochanteric bursitis of left hip 06/17/2016   Greater trochanteric bursitis of right hip 11/27/2015   Injected 11/27/2015    History of recurrent UTIs    [name]   HTN (hypertension)    Hx of osteopenia 10/11/2012   [facility]EXA; ordered by [name] Lowest T score: -2.1 @ lumbar spine @ [facility] 11/01/12; decrease of 8.5% vs 05/2009. There is 9.2%  risk over 10 yrs History fracture left elbow @ 4 Fractured left wrist , right elbow and nose post fall July/18/2013. Dr. [name] No FH Osteoporosis No PMH of bisphosphonate therapy (generic Fosamax Rxed but not filled  due to polypharmacy )    Hyperlipidemia    Hyperlipidemia associated with type 2 diabetes mellitus ([place]) 01/18/2007   Qualifier: Diagnosis of  By: [name]  With DM LDL goal = < 100, ideally < 70. Mi in father @ 64; 2 brothers @ 40 & 58     Lumbar radiculopathy 04/11/2008   Qualifier: Diagnosis of  By: [name] MD, Erick Blinks well to epidural 01/12/2017  repeat epidural given May 2018    Migraine headache    quiescent   Nonallopathic lesion of cervical region 07/19/2014   Nonallopathic lesion of lumbosacral region 01/15/2016   Nonallopathic lesion of sacral region 06/27/2018   Nonallopathic lesion-rib cage 09/26/2014   Palpitations 04/15/2011   Rotator cuff impingement syndrome of left shoulder 05/11/2014   Seasonal allergic rhinitis 03/21/2012   Sinusitis, chronic 04/20/2016   Sleep disorder    Dr Dohmier   Subluxation of peroneal tendon of right foot 11/10/2017   TIA (transient ischemic attack)    [place], HX OF 02/02/2008   Qualifier: Diagnosis of  By: Donalee Citrin ADVRS EFF UNS RX MEDICINAL&BIOLOGICAL [place] 02/02/2008   Qualifier: Diagnosis of  By: [name] MD, Posey Pronto AND MYOSITIS 02/02/2008   Qualifier: Diagnosis of  By: [name] MD, [name]     UTI (urinary tract infection) 10/05/2014   Viral upper respiratory tract infection with cough 12/31/2014   Vitamin D deficiency 05/25/2008   Qualifier: Diagnosis of  By: [name] MD, [name]     Past Surgical History:  Procedure Laterality Date   COLONOSCOPY     Dr Carlean [name]; hemorrhoids   CORONARY ANGIOPLASTY  12/15/1995   for chest pain- negative    KNEE ARTHROSCOPY Left 2023   POLYPECTOMY  12/14/2000   benign, hyperplastic polyp; rectal bleeding 2008   TONSILLECTOMY     TOTAL ABDOMINAL HYSTERECTOMY  12/14/1981   BSO for endometriosis   WISDOM TOOTH EXTRACTION      Allergies  Allergies  Allergen Reactions   Vioxx [Rofecoxib] Other (See Comments)    Excess BP which caused TIA    Prednisone Other (See Comments)    Mental status changes with high-dose oral agent. Tolerates epidural steroid injections. No associated rash or fever   Macrodantin [Nitrofurantoin Macrocrystal] Hives   Sulfa Antibiotics     Hives   Colesevelam Other (See Comments)    Leg Pain     EKGs/Labs/Other Studies Reviewed:   The following studies were reviewed today: LONG TERM MONITOR  07/2021: Study Highlights     Basic rhythm is normal sinus rhythm Non-sustained SVT at rates < 150 bpm. Longest 10 beats. PAC and PVC burden < 1% Poor correlation between symptoms and arrhythmia     CORONARY CTA 2021: IMPRESSION: 1. Coronary calcium score of 430. This was 54 percentile for age and sex matched control.   2. Normal coronary origin with right dominance.   3. Mild CAD noted in the mid LAD, D1 and mild Lcx.  CADRADS-2.   4. Study will be sent for FFR.   IMPRESSION: FFR findings not suggestive of obstructive CAD.  EKG:  EKG is not ordered today.    Recent Labs: 09/14/2022: ALT 24; BUN 20; Creatinine, Ser 0.87; Potassium 3.6;  Sodium 135 09/30/2022: Hemoglobin 13.1; Platelets 238.0; TSH 1.76  Recent Lipid Panel    Component Value Date/Time   CHOL 187 09/30/2022 1153   TRIG 81.0 09/30/2022 1153   HDL 65.30 09/30/2022 1153   CHOLHDL 3 09/30/2022 1153   VLDL 16.2 09/30/2022 1153   LDLCALC 105 (H) 09/30/2022 1153    Home Medications   Current Meds  Medication Sig   acetaminophen (TYLENOL) 500 MG tablet Take 500 mg by mouth as needed.   amoxicillin-clavulanate (AUGMENTIN) 500-125 MG tablet Take 1 tablet twice a day by oral route for 7 days.   aspirin 81 MG tablet Take 81 mg by mouth daily.   atorvastatin (LIPITOR) 40 MG tablet Take 1 tablet (40 mg total) by mouth daily.   DULoxetine (CYMBALTA) 60 MG capsule Take 60 mg by mouth daily.   Lancets (ONETOUCH ULTRASOFT) lancets Check blood sugar once daily. Dx code: 250.00   LORazepam (ATIVAN) 0.5 MG tablet Take 0.5 mg by mouth as needed for anxiety.   losartan (COZAAR) 25 MG tablet Take 0.5 tablets (12.5 mg total) by mouth at bedtime.   metoprolol succinate (TOPROL XL) 25 MG 24 hr tablet Take 0.5 tablets (12.5 mg total) by mouth daily.   naproxen (NAPROSYN) 500 MG tablet Take 1 tablet by mouth as needed.   omeprazole (PRILOSEC) 20 MG capsule TAKE 1 CAPSULE BY MOUTH EVERY DAY   ONETOUCH ULTRA test strip USE 1 STRIP  TWICE DAILY DX CODE E11.65   promethazine (PHENERGAN) 12.5 MG tablet Take 1 tablet (12.5 mg total) by mouth every 8 (eight) hours as needed for nausea or vomiting.   Semaglutide, 2 MG/DOSE, (OZEMPIC, 2 MG/DOSE,) 8 MG/3ML SOPN Inject 2 mg into the skin once a week. Gets from pt assist   tiZANidine (ZANAFLEX) 4 MG tablet TAKE 1 TABLET BY MOUTH AT BEDTIME. (Patient taking differently: Take 4 mg by mouth as needed.)   traZODone (DESYREL) 50 MG tablet TAKE 1/2 TO 1 TABLET BY MOUTH AT BEDTIME AS NEEDED FOR SLEEP   vitamin B-12 (CYANOCOBALAMIN) 1000 MCG tablet Take 1,000 mcg by mouth daily.   Vitamin D, Ergocalciferol, (DRISDOL) 1.25 MG (50000 UNIT) CAPS capsule TAKE 1 CAPSULE BY MOUTH ONE TIME PER WEEK   [DISCONTINUED] losartan (COZAAR) 50 MG tablet Take 1 tablet (50 mg total) by mouth daily. (Patient taking differently: Take 50 mg by mouth daily. Per patient taking 1/2 tab (25 mg) of a 50 mg tablet per PCP)   [DISCONTINUED] nortriptyline (PAMELOR) 10 MG capsule TAKE 1-2 CAPSULES (10-20 MG TOTAL) BY MOUTH AT BEDTIME.     Review of Systems      All other systems reviewed and are otherwise negative except as noted above.  Physical Exam    VS:  BP 120/76   Pulse 95   Ht '5\' 6"'$  (1.676 m)   Wt 141 lb 12.8 oz (64.3 kg)   LMP  (LMP Unknown)   SpO2 99%   BMI 22.89 kg/m  , BMI Body mass index is 22.89 kg/m.  Wt Readings from Last 3 Encounters:  11/27/22 141 lb 12.8 oz (64.3 kg)  11/19/22 141 lb (64 kg)  11/18/22 141 lb 8 oz (64.2 kg)     GEN: Well nourished, well developed, in no acute distress. HEENT: normal. Neck: Supple, no JVD, carotid bruits, or masses. Cardiac: RRR, no murmurs, rubs, or gallops. No clubbing, cyanosis, edema.  Radials/PT 2+ and equal bilaterally.  Respiratory:  Respirations regular and unlabored, clear to auscultation bilaterally. GI: Soft, nontender, nondistended.  MS: No deformity or atrophy. Skin: Warm and dry, no rash. Neuro:  Strength and sensation are  intact. Psych: Normal affect.  Assessment & Plan    Tachycardia/PVCs -metoprolol succinate 12.'5mg'$  daily added by her PCP -HR up to the 140s when she is walking around, recovers quickly with sitting  Orthostatic hypotension -146/73 laying down and then 108/70 standing. Increased to 125/78 after three minutes but HR up to 128 and patient endorsed dizziness -decrease Cozaar to 12.'5mg'$  daily (she takes this at night) -start metoprolol succinate  12.'5mg'$  daily -increase fluid intake to 64 ounces a day -increase salt intake -compression hose daily -slow transitions from sitting to standing -track BP and HR over the next week and send me those values via Mychart  Hyperlipidemia associated with type 2 diabetes mellitus -LDL 105 (goal 70) -on Lipitor '40mg'$  daily, continue for now -She will need repeat lipid panel and possible med adjustment at her next appointment -blood sugar has been well controlled, 120 (which is higher than normal for her) -continue current medications -per primary  CAD in native artery -LDL is above goal -continue asa '81mg'$ , lipitor '40mg'$  daily, cozaar 12.'5mg'$  daily (decreased today), metoprolol succinate 12.'5mg'$  daily (started today) -no current chest pain   Disposition: Follow up 1 month with Dorris Carnes, MD or APP.  Signed, Elgie Collard, PA-C 11/27/2022, 10:02 AM [place]

## 2022-11-27 ENCOUNTER — Ambulatory Visit: Payer: Medicare Other | Attending: Physician Assistant | Admitting: Physician Assistant

## 2022-11-27 ENCOUNTER — Encounter: Payer: Self-pay | Admitting: Physician Assistant

## 2022-11-27 VITALS — BP 120/76 | HR 95 | Ht 66.0 in | Wt 141.8 lb

## 2022-11-27 DIAGNOSIS — E1151 Type 2 diabetes mellitus with diabetic peripheral angiopathy without gangrene: Secondary | ICD-10-CM | POA: Diagnosis not present

## 2022-11-27 DIAGNOSIS — I493 Ventricular premature depolarization: Secondary | ICD-10-CM | POA: Diagnosis not present

## 2022-11-27 DIAGNOSIS — R000 Tachycardia, unspecified: Secondary | ICD-10-CM | POA: Diagnosis not present

## 2022-11-27 DIAGNOSIS — I2510 Atherosclerotic heart disease of native coronary artery without angina pectoris: Secondary | ICD-10-CM | POA: Diagnosis not present

## 2022-11-27 DIAGNOSIS — E1169 Type 2 diabetes mellitus with other specified complication: Secondary | ICD-10-CM | POA: Insufficient documentation

## 2022-11-27 DIAGNOSIS — E785 Hyperlipidemia, unspecified: Secondary | ICD-10-CM | POA: Insufficient documentation

## 2022-11-27 DIAGNOSIS — I10 Essential (primary) hypertension: Secondary | ICD-10-CM | POA: Diagnosis not present

## 2022-11-27 DIAGNOSIS — Z794 Long term (current) use of insulin: Secondary | ICD-10-CM | POA: Diagnosis not present

## 2022-11-27 MED ORDER — LOSARTAN POTASSIUM 25 MG PO TABS: 12.5000 mg | ORAL_TABLET | Freq: Every day | ORAL | 3 refills | Status: DC

## 2022-11-27 NOTE — Patient Instructions (Signed)
Medication Instructions:  1.Go ahead and start metoprolol succinate 12.5 mg daily as prescribed by your primary care provider 2.Decrease cozaar to 12.5 mg daily at bedtime, this will be 1/2 of a 25 mg tablet *If you need a refill on your cardiac medications before your next appointment, please call your pharmacy*   Lab Work: None If you have labs (blood work) drawn today and your tests are completely normal, you will receive your results only by: [place] (if you have MyChart) OR A paper copy in the mail If you have any lab test that is abnormal or we need to change your treatment, we will call you to review the results.   Follow-Up: At [facility], you and your health needs are our priority.  As part of our continuing mission to provide you with exceptional heart care, we have created designated Provider Care Teams.  These Care Teams include your primary Cardiologist (physician) and Advanced Practice Providers (APPs -  Physician Assistants and Nurse Practitioners) who all work together to provide you with the care you need, when you need it.   Your next appointment:   12/21/2022 at 3:40 PM  The format for your next appointment:   In Person  Provider:   Dr Harrington Challenger  Other Instructions 1.Check your blood pressure and heart rate daily one hour after taking your morning medications for the next week and send [place] the readings through your mychart. 2.Aim for 64 oz of water a day, add more sodium to your diet, avoid caffeine and wear compression socks/stockings.  Important Information About Sugar

## 2022-12-15 NOTE — Progress Notes (Signed)
[place] Phone: [phone] Subjective:   [name], am serving as a scribe for Dr. Hulan Saas.  I'm seeing this patient by the request  of:  [name], MD  CC: back and neck pain follow up   [id number]  Lashawnda ZILDA NO is a 72 y.o. female coming in with complaint of back and neck pain. OMT 10/12/2022. Suffered a fall in November and has been seeing Dr. Georgina Snell for head injury. Started vestibular PT. Patient states that since shockwave therapy she had a knot that popped up over the lateral heel. Not painful.   No longer having issues with word order and has thus discontinued PT.   Medications patient has been prescribed: Vit D, Zanaflex  Taking:         Reviewed prior external information including notes and imaging from previsou exam, outside providers and external EMR if available.   As well as notes that were available from care everywhere and other healthcare systems.  Past medical history, social, surgical and family history all reviewed in electronic medical record.  No pertanent information unless stated regarding to the chief complaint.   Past Medical History:  Diagnosis Date   Acute bursitis of right shoulder 08/29/2018   Right shoulder injected in August 29, 2018   Allergic rhinitis    Closed displaced fracture of fifth metatarsal bone of left foot 03/16/2017   DEPRESSIVE DISORDER NOT ELSEWHERE CLASSIFIED 03/27/2010   Qualifier: Diagnosis of  By: [name] MD, [name]   [name], NP , [facility] [place]     Diabetes ([place]) 04/02/2016   Diabetes mellitus    Essential hypertension 03/14/2008   Qualifier: Diagnosis of  By: [name] MD, [name]     Fibromyalgia 08/12/2011   Diagnosed by [name] , Rheumatologist 2010    GERD (gastroesophageal reflux disease) 12/25/2016   Gluteal tendonitis of right buttock 10/19/2017   Injected in October 19, 2017 Injected  October 07, 2018   Greater trochanteric bursitis of left hip 06/17/2016   Greater trochanteric bursitis of right hip 11/27/2015   Injected 11/27/2015    History of recurrent UTIs    [name]   HTN (hypertension)    Hx of osteopenia 10/11/2012   [facility]; ordered by [name] Lowest T score: -2.1 @ lumbar spine @ [facility] 11/01/12; decrease of 8.5% vs 05/2009. There is 9.2% risk over 10 yrs History fracture left elbow @ 4 Fractured left wrist , right elbow and nose post fall July/18/2013. Dr. [name] No FH Osteoporosis No PMH of bisphosphonate therapy (generic Fosamax Rxed but not filled  due to polypharmacy )    Hyperlipidemia    Hyperlipidemia associated with type 2 diabetes mellitus ([place]) 01/18/2007   Qualifier: Diagnosis of  By: [name]  With DM LDL goal = < 100, ideally < 70. Mi in father @ 91; 2 brothers @ 37 & 48     Lumbar radiculopathy 04/11/2008   Qualifier: Diagnosis of  By: [name] MD, Erick Blinks well to epidural 01/12/2017  repeat epidural given May 2018   Migraine headache    quiescent   Nonallopathic lesion of cervical region 07/19/2014   Nonallopathic lesion of lumbosacral region 01/15/2016   Nonallopathic lesion of sacral region 06/27/2018   Nonallopathic lesion-rib cage 09/26/2014   Palpitations 04/15/2011   Rotator cuff impingement syndrome of left shoulder 05/11/2014   Seasonal allergic rhinitis 03/21/2012   Sinusitis,  chronic 04/20/2016   Sleep disorder    Dr Dohmier   Subluxation of peroneal tendon of right foot 11/10/2017   TIA (transient ischemic attack)    [place], HX OF 02/02/2008   Qualifier: Diagnosis of  By: Donalee Citrin ADVRS EFF UNS RX MEDICINAL&BIOLOGICAL [place] 02/02/2008   Qualifier: Diagnosis of  By: [name] MD, Posey Pronto AND MYOSITIS 02/02/2008   Qualifier: Diagnosis of  By: [name] MD, [name]     UTI (urinary tract infection) 10/05/2014   Viral upper respiratory tract infection with cough  12/31/2014   Vitamin D deficiency 05/25/2008   Qualifier: Diagnosis of  By: [name] MD, [name]      Allergies  Allergen Reactions   Vioxx [Rofecoxib] Other (See Comments)    Excess BP which caused TIA    Prednisone Other (See Comments)    Mental status changes with high-dose oral agent. Tolerates epidural steroid injections. No associated rash or fever   Macrodantin [Nitrofurantoin Macrocrystal] Hives   Sulfa Antibiotics     Hives   Colesevelam Other (See Comments)    Leg Pain     Review of Systems:  No  visual changes, nausea, vomiting, diarrhea, constipation, dizziness, abdominal pain, skin rash, fevers, chills, night sweats, weight loss, swollen lymph nodes, body aches, joint swelling, chest pain, shortness of breath, mood changes. POSITIVE muscle aches, headache, body ache  Objective  Blood pressure 128/80, pulse 87, height 5' 6.5" (1.689 m), weight 142 lb (64.4 kg), SpO2 98 %.   General: No apparent distress alert and oriented x3 mood and affect normal, dressed appropriately.  HEENT: Pupils equal, extraocular movements intact  Respiratory: Patient's speak in full sentences and does not appear short of breath  Cardiovascular: No lower extremity edema, non tender, no erythema  Neck exam does have some loss of lordosis.  Some tenderness to palpation in the paraspinal musculature.  Patient does have some mild crepitus noted.  Patient's heel is tender to palpation more on the plantar aspect.  Osteopathic findings  C2 flexed rotated and side bent right C7 flexed rotated and side bent left T3 extended rotated and side bent right inhaled rib T8 extended rotated and side bent left L2 flexed rotated and side bent right Sacrum right on right       Assessment and Plan:  Cervical disc disorder with radiculopathy of cervical region Degenerative disc disease.  Discussed icing regimen and home exercises, which activities to do and which ones to avoid, increase activity slowly.   Discussed with patient's crepitus at this moment and which ones to do and which ones to avoid.  Patient will watch for any other types of dizziness but with the change in her medications from cardiology seems to be doing better as well.  Follow-up again in 6 to 8 weeks.    Nonallopathic problems  Decision today to treat with OMT was based on Physical Exam  After verbal consent patient was treated with HVLA, ME, FPR techniques in cervical, rib, thoracic, lumbar, and sacral  areas  Patient tolerated the procedure well with improvement in symptoms  Patient given exercises, stretches and lifestyle modifications  See medications in patient instructions if given  Patient will follow up in 4-8 weeks    The above documentation has been reviewed and is accurate and complete Lyndal Pulley, DO          Note: This dictation was prepared with Dragon dictation along with smaller  Company secretary. Any transcriptional errors that result from this process are unintentional.

## 2022-12-17 ENCOUNTER — Other Ambulatory Visit: Payer: Self-pay | Admitting: Family Medicine

## 2022-12-20 NOTE — Progress Notes (Unsigned)
Office Visit    Patient Name: Christine [name] Date of Encounter: 12/21/2022   HPI    Christine [name] is a 72 y.o. female with past medical history significant for nonobstructive CAD, DM 2, hyperlipidemia, TIA, and palpitations presents today for follow-up appointment. Monitor in Aug 2022 SVT  nonsustained   PACS, PVCs less 1%  The pt was seen by Joellyn Rued in Dec 2023  complained of dizziness with standing   Had fallen  PCP added metoprolol and decreaed Cozaar   Ortostatic BP were positvie   Recomm increased fluid/salt intake   SInce then she has had some dizzy spells, a few severe when she had to sit down   No syncope    HR not as high  The pt admits to not drinking as much as she should   Urine is darker, not clear  Got back form a trip to [facility] visiting family  Since then she has had bilateral CP under her arms   Tender to touch   Past Medical History:  Diagnosis Date   Acute bursitis of right shoulder 08/29/2018   Right shoulder injected in August 29, 2018   Allergic rhinitis    Closed displaced fracture of fifth metatarsal bone of left foot 03/16/2017   DEPRESSIVE DISORDER NOT ELSEWHERE CLASSIFIED 03/27/2010   Qualifier: Diagnosis of  By: [name] MD, [name]   [name], NP , [facility] [place]     Diabetes ([place]) 04/02/2016   Diabetes mellitus    Essential hypertension 03/14/2008   Qualifier: Diagnosis of  By: [name] MD, [name]     Fibromyalgia 08/12/2011   Diagnosed by [name] , Rheumatologist 2010    GERD (gastroesophageal reflux disease) 12/25/2016   Gluteal tendonitis of right buttock 10/19/2017   Injected in October 19, 2017 Injected October 07, 2018   Greater trochanteric bursitis of left hip 06/17/2016   Greater trochanteric bursitis of right hip 11/27/2015   Injected 11/27/2015    History of recurrent UTIs    [name]   HTN (hypertension)    Hx of osteopenia 10/11/2012   [facility] DEXA; ordered by [name] Lowest T score: -2.1 @ lumbar  spine @ [facility] 11/01/12; decrease of 8.5% vs 05/2009. There is 9.2% risk over 10 yrs History fracture left elbow @ 6 Fractured left wrist , right elbow and nose post fall July/18/2013. Dr. [name] No FH Osteoporosis No PMH of bisphosphonate therapy (generic Fosamax Rxed but not filled  due to polypharmacy )    Hyperlipidemia    Hyperlipidemia associated with type 2 diabetes mellitus ([place]) 01/18/2007   Qualifier: Diagnosis of  By: [name]  With DM LDL goal = < 100, ideally < 70. Mi in father @ 63; 2 brothers @ 15 & 1     Lumbar radiculopathy 04/11/2008   Qualifier: Diagnosis of  By: [name] MD, Erick Blinks well to epidural 01/12/2017  repeat epidural given May 2018   Migraine headache    quiescent   Nonallopathic lesion of cervical region 07/19/2014   Nonallopathic lesion of lumbosacral region 01/15/2016   Nonallopathic lesion of sacral region 06/27/2018   Nonallopathic lesion-rib cage 09/26/2014   Palpitations 04/15/2011   Rotator cuff impingement syndrome of left shoulder 05/11/2014   Seasonal allergic rhinitis 03/21/2012   Sinusitis, chronic 04/20/2016   Sleep disorder    Dr Dohmier   Subluxation of peroneal tendon of right foot 11/10/2017   TIA (transient ischemic attack)  TRANSIENT ISCHEMIC ATTACKS, HX OF 02/02/2008   Qualifier: Diagnosis of  By: Donalee Citrin ADVRS EFF UNS RX MEDICINAL&BIOLOGICAL [place] 02/02/2008   Qualifier: Diagnosis of  By: [name] MD, Posey Pronto AND MYOSITIS 02/02/2008   Qualifier: Diagnosis of  By: [name] MD, [name]     UTI (urinary tract infection) 10/05/2014   Viral upper respiratory tract infection with cough 12/31/2014   Vitamin D deficiency 05/25/2008   Qualifier: Diagnosis of  By: [name] MD, [name]     Past Surgical History:  Procedure Laterality Date   COLONOSCOPY     Dr Carlean [name]; hemorrhoids   CORONARY ANGIOPLASTY  12/15/1995   for chest pain- negative    KNEE ARTHROSCOPY Left 2023   POLYPECTOMY  12/14/2000    benign, hyperplastic polyp; rectal bleeding 2008   TONSILLECTOMY     TOTAL ABDOMINAL HYSTERECTOMY  12/14/1981   BSO for endometriosis   WISDOM TOOTH EXTRACTION      Allergies  Allergies  Allergen Reactions   Vioxx [Rofecoxib] Other (See Comments)    Excess BP which caused TIA    Prednisone Other (See Comments)    Mental status changes with high-dose oral agent. Tolerates epidural steroid injections. No associated rash or fever   Macrodantin [Nitrofurantoin Macrocrystal] Hives   Sulfa Antibiotics     Hives   Colesevelam Other (See Comments)    Leg Pain     EKGs/Labs/Other Studies Reviewed:   The following studies were reviewed today: LONG TERM MONITOR 07/2021: Study Highlights     Basic rhythm is normal sinus rhythm Non-sustained SVT at rates < 150 bpm. Longest 10 beats. PAC and PVC burden < 1% Poor correlation between symptoms and arrhythmia     CORONARY CTA 2021: IMPRESSION: 1. Coronary calcium score of 430. This was 77 percentile for age and sex matched control.   2. Normal coronary origin with right dominance.   3. Mild CAD noted in the mid LAD, D1 and mild Lcx.  CADRADS-2.   4. Study will be sent for FFR.   IMPRESSION: FFR findings not suggestive of obstructive CAD.  EKG:  EKG is not ordered today.    Recent Labs: 09/14/2022: ALT 24; BUN 20; Creatinine, Ser 0.87; Potassium 3.6; Sodium 135 09/30/2022: Hemoglobin 13.1; Platelets 238.0; TSH 1.76  Recent Lipid Panel    Component Value Date/Time   CHOL 187 09/30/2022 1153   TRIG 81.0 09/30/2022 1153   HDL 65.30 09/30/2022 1153   CHOLHDL 3 09/30/2022 1153   VLDL 16.2 09/30/2022 1153   LDLCALC 105 (H) 09/30/2022 1153    Home Medications   Current Meds  Medication Sig   acetaminophen (TYLENOL) 500 MG tablet Take 500 mg by mouth as needed.   aspirin 81 MG tablet Take 81 mg by mouth daily.   atorvastatin (LIPITOR) 40 MG tablet Take 1 tablet (40 mg total) by mouth daily.   cephALEXin (KEFLEX) 250 MG  capsule Take 250 mg by mouth daily. UTI   DULoxetine (CYMBALTA) 60 MG capsule Take 60 mg by mouth daily.   Lancets (ONETOUCH ULTRASOFT) lancets Check blood sugar once daily. Dx code: 250.00   LORazepam (ATIVAN) 0.5 MG tablet Take 0.5 mg by mouth as needed for anxiety.   metoprolol succinate (TOPROL XL) 25 MG 24 hr tablet Take 0.5 tablets (12.5 mg total) by mouth daily.   naproxen (NAPROSYN) 500 MG tablet Take 1 tablet by mouth as needed.   omeprazole (PRILOSEC) 20 MG capsule TAKE  1 CAPSULE BY MOUTH EVERY DAY   ONETOUCH ULTRA test strip USE 1 STRIP TWICE DAILY DX CODE E11.65   promethazine (PHENERGAN) 12.5 MG tablet Take 1 tablet (12.5 mg total) by mouth every 8 (eight) hours as needed for nausea or vomiting.   Semaglutide, 2 MG/DOSE, (OZEMPIC, 2 MG/DOSE,) 8 MG/3ML SOPN Inject 2 mg into the skin once a week. Gets from pt assist   tiZANidine (ZANAFLEX) 4 MG tablet TAKE 1 TABLET BY MOUTH AT BEDTIME.   traMADol (ULTRAM) 50 MG tablet as needed.   traZODone (DESYREL) 50 MG tablet TAKE 1/2 TO 1 TABLET BY MOUTH AT BEDTIME AS NEEDED FOR SLEEP   vitamin B-12 (CYANOCOBALAMIN) 1000 MCG tablet Take 1,000 mcg by mouth daily.   Vitamin D, Ergocalciferol, (DRISDOL) 1.25 MG (50000 UNIT) CAPS capsule TAKE 1 CAPSULE BY MOUTH ONE TIME PER WEEK   [DISCONTINUED] losartan (COZAAR) 25 MG tablet Take 0.5 tablets (12.5 mg total) by mouth at bedtime.     Review of Systems      All other systems reviewed and are otherwise negative except as noted above.  Physical Exam    VS:  BP 124/72   Pulse 81   Ht 5' 6.5" (1.689 m)   Wt 142 lb 3.2 oz (64.5 kg)   LMP  (LMP Unknown)   SpO2 98%   BMI 22.61 kg/m  , BMI Body mass index is 22.61 kg/m.  Orthostatics  BP 132/68  P 80   Sitting   13074  P 85   Standing 11/62  P 100  Standing 4 min   118/722 P 100    Wt Readings from Last 3 Encounters:  12/21/22 142 lb 3.2 oz (64.5 kg)  11/27/22 141 lb 12.8 oz (64.3 kg)  11/19/22 141 lb (64 kg)     GEN: Well nourished, well  developed, in no acute distress. HEENT: normal. Neck: Supple, no JVD, carotid bruits   Cardiac: RRR, no murmurs,  No LE Edema  Radials/PT 2+ and equal bilaterally.  Respiratory:  Respirations regular and unlabored, clear to auscultation bilaterally. GI: Soft, nontender, nondistended. MS: No deformity or atrophy. Skin: Warm and dry, no rash. Neuro:  Strength and sensation are intact. Psych: Normal affect.  Assessment & Plan    Orthostatic hypotension Ptatient orthostatic today in clinic  Admits to not drinking enough during the day   Urine is concentrated    Recom stopping losartan (on a low dose) I would recomm increasing fluid and salt intake  before adding meds  Stay active Continue to use compression hose, Can try Spanx     Elevate HOB Follow up in a couple months  2  Hx tachycardia , PVCs   Pt says Heart rate has been a little lower on Toprol   [place]   Hyperlipidemia associated with type 2 diabetes mellitus -LDL 105 (goal 70) -on Lipitor '40mg'$  daily, continue for now  Check on return  Needs tighter control but will optimize BP first   CAD in native artery -LDL is above goal  Keep on current lipitor dose for now   -continue asa '81mg'$ , lipitor '40mg'$  daily, metoprolol succinate 12.'5mg'$  daily (started today)  Stop losartan -no current chest pain   Disposition: Follow up 1 month with Dorris Carnes, MD or APP.  Signed, Dorris Carnes, MD 12/21/2022, 9:45 PM [facility]

## 2022-12-21 ENCOUNTER — Encounter: Payer: Self-pay | Admitting: Internal Medicine

## 2022-12-21 ENCOUNTER — Ambulatory Visit: Payer: Medicare Other | Attending: Internal Medicine | Admitting: Internal Medicine

## 2022-12-21 VITALS — BP 124/72 | HR 81 | Ht 66.5 in | Wt 142.2 lb

## 2022-12-21 DIAGNOSIS — R42 Dizziness and giddiness: Secondary | ICD-10-CM | POA: Diagnosis not present

## 2022-12-21 NOTE — Patient Instructions (Signed)
Medication Instructions:  Stop Losartan  *If you need a refill on your cardiac medications before your next appointment, please call your pharmacy*   Lab Work:  If you have labs (blood work) drawn today and your tests are completely normal, you will receive your results only by: [place] (if you have MyChart) OR A paper copy in the mail If you have any lab test that is abnormal or we need to change your treatment, we will call you to review the results.   Testing/Procedures:    Follow-Up: At [facility] you and your health needs are our priority.  As part of our continuing mission to provide you with exceptional heart care, we have created designated Provider Care Teams.  These Care Teams include your primary Cardiologist (physician) and Advanced Practice Providers (APPs -  Physician Assistants and Nurse Practitioners) who all work together to provide you with the care you need, when you need it.  We recommend signing up for the patient portal called "MyChart".  Sign up information is provided on this After Visit Summary.  MyChart is used to connect with patients for Virtual Visits (Telemedicine).  Patients are able to view lab/test results, encounter notes, upcoming appointments, etc.  Non-urgent messages can be sent to your provider as well.   To learn more about what you can do with MyChart, go to [email or web address].    Your next appointment:   3 month(s)  The format for your next appointment:   In Person  Provider:   Dorris Carnes, MD     Other Instructions  INCREASE FLUIDS AND SALTY SNACKS   Important Information About Sugar

## 2022-12-22 ENCOUNTER — Ambulatory Visit (INDEPENDENT_AMBULATORY_CARE_PROVIDER_SITE_OTHER): Payer: Medicare Other | Admitting: Family Medicine

## 2022-12-22 ENCOUNTER — Encounter: Payer: Self-pay | Admitting: Family Medicine

## 2022-12-22 VITALS — BP 128/80 | HR 87 | Ht 66.5 in | Wt 142.0 lb

## 2022-12-22 DIAGNOSIS — M9903 Segmental and somatic dysfunction of lumbar region: Secondary | ICD-10-CM

## 2022-12-22 DIAGNOSIS — M9904 Segmental and somatic dysfunction of sacral region: Secondary | ICD-10-CM

## 2022-12-22 DIAGNOSIS — M9901 Segmental and somatic dysfunction of cervical region: Secondary | ICD-10-CM | POA: Diagnosis not present

## 2022-12-22 DIAGNOSIS — M9908 Segmental and somatic dysfunction of rib cage: Secondary | ICD-10-CM

## 2022-12-22 DIAGNOSIS — M5010 Cervical disc disorder with radiculopathy, unspecified cervical region: Secondary | ICD-10-CM

## 2022-12-22 DIAGNOSIS — M9902 Segmental and somatic dysfunction of thoracic region: Secondary | ICD-10-CM | POA: Diagnosis not present

## 2022-12-22 NOTE — Patient Instructions (Signed)
Hoka arahi or allegria shoes Consider a gel cup See me in 5-6 weeks

## 2022-12-22 NOTE — Assessment & Plan Note (Signed)
Degenerative disc disease.  Discussed icing regimen and home exercises, which activities to do and which ones to avoid, increase activity slowly.  Discussed with patient's crepitus at this moment and which ones to do and which ones to avoid.  Patient will watch for any other types of dizziness but with the change in her medications from cardiology seems to be doing better as well.  Follow-up again in 6 to 8 weeks.

## 2022-12-30 NOTE — Therapy (Signed)
[facility] [address], [place], [place], [address] Phone: [phone]   Fax:  [phone]  Patient Details  Name: Christine [name] MRN: [id number] Date of Birth: 09-22-1951 Referring Provider:  No ref. provider found  Encounter Date: 12/30/2022 PHYSICAL THERAPY DISCHARGE SUMMARY  Visits from Start of Care: 4  Current functional level related to goals / functional outcomes: Progressing w/ STG/LTG   Remaining deficits: unknown   Education / Equipment: HEP initiated   Patient agrees to discharge. Patient goals were not met. Patient is being discharged due to not returning since the last visit.   Toniann Fail, PT 12/30/2022, 2:33 PM  [facility] [place] [address], [place], [place], [address] Phone: [phone]   Fax:  [phone]

## 2022-12-31 ENCOUNTER — Encounter: Payer: Self-pay | Admitting: Family Medicine

## 2022-12-31 ENCOUNTER — Ambulatory Visit (INDEPENDENT_AMBULATORY_CARE_PROVIDER_SITE_OTHER): Payer: Medicare Other | Admitting: Family Medicine

## 2022-12-31 VITALS — BP 140/80 | HR 89 | Temp 97.9°F | Ht 66.0 in | Wt 141.1 lb

## 2022-12-31 DIAGNOSIS — G43009 Migraine without aura, not intractable, without status migrainosus: Secondary | ICD-10-CM | POA: Diagnosis not present

## 2022-12-31 DIAGNOSIS — R42 Dizziness and giddiness: Secondary | ICD-10-CM

## 2022-12-31 DIAGNOSIS — E1165 Type 2 diabetes mellitus with hyperglycemia: Secondary | ICD-10-CM

## 2022-12-31 DIAGNOSIS — R000 Tachycardia, unspecified: Secondary | ICD-10-CM

## 2022-12-31 LAB — POCT GLYCOSYLATED HEMOGLOBIN (HGB A1C): Hemoglobin A1C: 6.7 % — AB (ref 4.0–5.6)

## 2022-12-31 NOTE — Progress Notes (Signed)
Subjective:     Patient ID: Christine [name], female    DOB: Jan 16, 1951, 72 y.o.   MRN: [id number]  Chief Complaint  Patient presents with   Follow-up    3 month follow-up on dm Not fasting     HPI  DM type 2-ozempic '2mg'$ .  Sugars. 105-140 fasting.  Later in day-150 tops.  No n/t.  Checks feet.  Needs pt assist for ozempic and may run out HTN-was getting too low, so card took off meds.  Still on metoprolol for HR.  Still getting some dizzy spells when gets up from sitting.  None for 2-3 wks, then got past 2 mornings-no ha but swimmy headed all day.  Drinking 2-3 bottles of water/day.  Sees Card in April Dizziness-had migraine 1/15 and 1/16.  Takes ozempic on  Tues.  Brain is fuzzy after the dizzy spells. Pressure forehead. Has been going on several years and has had w/u.   Health Maintenance Due  Topic Date Due   DTaP/Tdap/Td (2 - Tdap) 06/08/2019    Past Medical History:  Diagnosis Date   Acute bursitis of right shoulder 08/29/2018   Right shoulder injected in August 29, 2018   Allergic rhinitis    Closed displaced fracture of fifth metatarsal bone of left foot 03/16/2017   DEPRESSIVE DISORDER NOT ELSEWHERE CLASSIFIED 03/27/2010   Qualifier: Diagnosis of  By: [name] MD, [name]   Mr Christine Manila, NP , [facility] [place]     Diabetes ([place]) 04/02/2016   Diabetes mellitus    Essential hypertension 03/14/2008   Qualifier: Diagnosis of  By: [name] MD, [name]     Fibromyalgia 08/12/2011   Diagnosed by [name] , Rheumatologist 2010    GERD (gastroesophageal reflux disease) 12/25/2016   Gluteal tendonitis of right buttock 10/19/2017   Injected in October 19, 2017 Injected October 07, 2018   Greater trochanteric bursitis of left hip 06/17/2016   Greater trochanteric bursitis of right hip 11/27/2015   Injected 11/27/2015    History of recurrent UTIs    [name]   HTN (hypertension)    Hx of osteopenia 10/11/2012   [facility]A; ordered by [name] Lowest T score:  -2.1 @ lumbar spine @ [facility] 11/01/12; decrease of 8.5% vs 05/2009. There is 9.2% risk over 10 yrs History fracture left elbow @ 6 Fractured left wrist , right elbow and nose post fall July/18/2013. Dr. [name] No FH Osteoporosis No PMH of bisphosphonate therapy (generic Fosamax Rxed but not filled  due to polypharmacy )    Hyperlipidemia    Hyperlipidemia associated with type 2 diabetes mellitus ([place]) 01/18/2007   Qualifier: Diagnosis of  By: [name]  With DM LDL goal = < 100, ideally < 70. Mi in father @ 95; 2 brothers @ 13 & 79     Lumbar radiculopathy 04/11/2008   Qualifier: Diagnosis of  By: [name] MD, Erick Blinks well to epidural 01/12/2017  repeat epidural given May 2018   Migraine headache    quiescent   Nonallopathic lesion of cervical region 07/19/2014   Nonallopathic lesion of lumbosacral region 01/15/2016   Nonallopathic lesion of sacral region 06/27/2018   Nonallopathic lesion-rib cage 09/26/2014   Palpitations 04/15/2011   Rotator cuff impingement syndrome of left shoulder 05/11/2014   Seasonal allergic rhinitis 03/21/2012   Sinusitis, chronic 04/20/2016   Sleep disorder    Dr Dohmier   Subluxation of peroneal tendon of right foot 11/10/2017   TIA (transient ischemic attack)  TRANSIENT ISCHEMIC ATTACKS, HX OF 02/02/2008   Qualifier: Diagnosis of  By: Donalee Citrin ADVRS EFF UNS RX MEDICINAL&BIOLOGICAL [place] 02/02/2008   Qualifier: Diagnosis of  By: [name] MD, Posey Pronto AND MYOSITIS 02/02/2008   Qualifier: Diagnosis of  By: [name] MD, [name]     UTI (urinary tract infection) 10/05/2014   Viral upper respiratory tract infection with cough 12/31/2014   Vitamin D deficiency 05/25/2008   Qualifier: Diagnosis of  By: [name] MD, [name]      Past Surgical History:  Procedure Laterality Date   COLONOSCOPY     Dr Carlean [name]; hemorrhoids   CORONARY ANGIOPLASTY  12/15/1995   for chest pain- negative    KNEE ARTHROSCOPY Left 2023   POLYPECTOMY   12/14/2000   benign, hyperplastic polyp; rectal bleeding 2008   TONSILLECTOMY     TOTAL ABDOMINAL HYSTERECTOMY  12/14/1981   BSO for endometriosis   WISDOM TOOTH EXTRACTION      Outpatient Medications Prior to Visit  Medication Sig Dispense Refill   acetaminophen (TYLENOL) 500 MG tablet Take 500 mg by mouth as needed.     aspirin 81 MG tablet Take 81 mg by mouth daily.     atorvastatin (LIPITOR) 40 MG tablet Take 1 tablet (40 mg total) by mouth daily. 90 tablet 3   cephALEXin (KEFLEX) 250 MG capsule Take 250 mg by mouth daily. UTI     DULoxetine (CYMBALTA) 60 MG capsule Take 60 mg by mouth daily.     Lancets (ONETOUCH ULTRASOFT) lancets Check blood sugar once daily. Dx code: 250.00 100 each 12   LORazepam (ATIVAN) 0.5 MG tablet Take 0.5 mg by mouth as needed for anxiety.     metoprolol succinate (TOPROL XL) 25 MG 24 hr tablet Take 0.5 tablets (12.5 mg total) by mouth daily. 30 tablet 1   naproxen (NAPROSYN) 500 MG tablet Take 1 tablet by mouth as needed.     omeprazole (PRILOSEC) 20 MG capsule TAKE 1 CAPSULE BY MOUTH EVERY DAY 90 capsule 3   ONETOUCH ULTRA test strip USE 1 STRIP TWICE DAILY DX CODE E11.65 100 strip 3   promethazine (PHENERGAN) 12.5 MG tablet Take 1 tablet (12.5 mg total) by mouth every 8 (eight) hours as needed for nausea or vomiting. 20 tablet 8   Semaglutide, 2 MG/DOSE, (OZEMPIC, 2 MG/DOSE,) 8 MG/3ML SOPN Inject 2 mg into the skin once a week. Gets from pt assist 9 mL 3   tiZANidine (ZANAFLEX) 4 MG tablet TAKE 1 TABLET BY MOUTH AT BEDTIME. 90 tablet 1   traMADol (ULTRAM) 50 MG tablet as needed.     traZODone (DESYREL) 50 MG tablet TAKE 1/2 TO 1 TABLET BY MOUTH AT BEDTIME AS NEEDED FOR SLEEP 90 tablet 1   vitamin B-12 (CYANOCOBALAMIN) 1000 MCG tablet Take 1,000 mcg by mouth daily.     Vitamin D, Ergocalciferol, (DRISDOL) 1.25 MG (50000 UNIT) CAPS capsule TAKE 1 CAPSULE BY MOUTH ONE TIME PER WEEK 12 capsule 0   No facility-administered medications prior to visit.     Allergies  Allergen Reactions   Vioxx [Rofecoxib] Other (See Comments)    Excess BP which caused TIA    Prednisone Other (See Comments)    Mental status changes with high-dose oral agent. Tolerates epidural steroid injections. No associated rash or fever   Macrodantin [Nitrofurantoin Macrocrystal] Hives   Sulfa Antibiotics     Hives   Colesevelam Other (See Comments)    Leg  Pain   ROS neg/noncontributory except as noted HPI/below      Objective:     BP (!) 140/80   Pulse 89   Temp 97.9 F (36.6 C) (Temporal)   Ht '5\' 6"'$  (1.676 m)   Wt 141 lb 2 oz (64 kg)   LMP  (LMP Unknown)   SpO2 96%   BMI 22.78 kg/m  Wt Readings from Last 3 Encounters:  12/31/22 141 lb 2 oz (64 kg)  12/22/22 142 lb (64.4 kg)  12/21/22 142 lb 3.2 oz (64.5 kg)    Physical Exam   Gen: WDWN NAD.  She did get dizzy upon standing today.  HEENT: NCAT, conjunctiva not injected, sclera nonicteric NECK:  supple, no thyromegaly, no nodes, no carotid bruits CARDIAC: RRR, S1S2+, no murmur. DP 2+B LUNGS: CTAB. No wheezes ABDOMEN:  BS+, soft, NTND, No HSM, no masses EXT:  no edema MSK: no gross abnormalities.  NEURO: A&O x3.  CN II-XII intact.  PSYCH: normal mood. Good eye contact  Diabetic Foot Exam - Simple   Simple Foot Form Diabetic Foot exam was performed with the following findings: Yes 12/31/2022 11:47 AM  Visual Inspection See comments: Yes Sensation Testing Intact to touch and monofilament testing bilaterally: Yes Pulse Check Posterior Tibialis and Dorsalis pulse intact bilaterally: Yes Comments L foot/heel-2 small spots-nt. Not sure.       Results for orders placed or performed in visit on 12/31/22  POCT glycosylated hemoglobin (Hb A1C)  Result Value Ref Range   Hemoglobin A1C 6.7 (A) 4.0 - 5.6 %     Assessment & Plan:   Problem List Items Addressed This Visit       Cardiovascular and Mediastinum   Migraine     Endocrine   Type 2 diabetes mellitus with hyperglycemia,  without long-term current use of insulin (HCC) - Primary   Relevant Orders   POCT glycosylated hemoglobin (Hb A1C) (Completed)     Other   Dizziness   Tachycardia  1.  Type 2 diabetes-chronic.  Well-controlled.  Continue Ozempic 2 mg weekly.  She brought patient assistant forms that will be filled out and sent in.  A1c today was 6.7!  Foot exam was done.  Follow-up in 3 months 2.  Tachycardia-chronic.  Controlled on metoprolol XL 12.5 mg daily. 3.  Migraine-chronic.  Had on Monday and Tuesday.  Resolved.  Takes naproxen if needed.  Keep a log.  Consider Nurtec. 4.  Dizziness-etiology is still unclear.  She gets hypotensive.  She is off all of her antihypertensives.  She still takes metoprolol 12.5 mg daily as tachycardia is uncontrolled without it.  So, dizziness could partly be meds, neuropathy, possible complex migraine.  Workup has been negative.  I also wonder if strangely Ozempic may be contributing.  She knows to be very careful getting up and limiting activities when it is occurring.  Advised to keep a log.  Advised to get Tdap at the pharmacy.  She will get her colonoscopy scheduled.  No orders of the defined types were placed in this encounter.   Wellington Hampshire, MD

## 2022-12-31 NOTE — Patient Instructions (Addendum)
It was very nice to see you today!  Get Tdap(tetanus) at pharmacy Schedule colonoscopy Log dizziness and migraine   PLEASE NOTE:  If you had any lab tests please let us know if you have not heard back within a few days. You may see your results on MyChart before we have a chance to review them but we will give you a call once they are reviewed by [place]. If we ordered any referrals today, please let us know if you have not heard from their office within the next week.   Please try these tips to maintain a healthy lifestyle:  Eat most of your calories during the day when you are active. Eliminate processed foods including packaged sweets (pies, cakes, cookies), reduce intake of potatoes, white bread, white pasta, and white rice. Look for whole grain options, oat flour or almond flour.  Each meal should contain half fruits/vegetables, one quarter protein, and one quarter carbs (no bigger than a computer mouse).  Cut down on sweet beverages. This includes juice, soda, and sweet tea. Also watch fruit intake, though this is a healthier sweet option, it still contains natural sugar! Limit to 3 servings daily.  Drink at least 1 glass of water with each meal and aim for at least 8 glasses per day  Exercise at least 150 minutes every week.

## 2023-01-08 ENCOUNTER — Ambulatory Visit: Payer: Self-pay

## 2023-01-08 ENCOUNTER — Encounter: Payer: Self-pay | Admitting: Family Medicine

## 2023-01-08 ENCOUNTER — Ambulatory Visit (INDEPENDENT_AMBULATORY_CARE_PROVIDER_SITE_OTHER): Payer: Medicare Other | Admitting: Family Medicine

## 2023-01-08 VITALS — BP 128/82 | HR 82 | Ht 66.0 in | Wt 141.0 lb

## 2023-01-08 DIAGNOSIS — M5010 Cervical disc disorder with radiculopathy, unspecified cervical region: Secondary | ICD-10-CM | POA: Diagnosis not present

## 2023-01-08 DIAGNOSIS — M9902 Segmental and somatic dysfunction of thoracic region: Secondary | ICD-10-CM | POA: Diagnosis not present

## 2023-01-08 DIAGNOSIS — M7989 Other specified soft tissue disorders: Secondary | ICD-10-CM | POA: Diagnosis not present

## 2023-01-08 DIAGNOSIS — M9908 Segmental and somatic dysfunction of rib cage: Secondary | ICD-10-CM

## 2023-01-08 DIAGNOSIS — M9903 Segmental and somatic dysfunction of lumbar region: Secondary | ICD-10-CM

## 2023-01-08 DIAGNOSIS — M9901 Segmental and somatic dysfunction of cervical region: Secondary | ICD-10-CM | POA: Diagnosis not present

## 2023-01-08 DIAGNOSIS — M9904 Segmental and somatic dysfunction of sacral region: Secondary | ICD-10-CM | POA: Diagnosis not present

## 2023-01-08 DIAGNOSIS — M79672 Pain in left foot: Secondary | ICD-10-CM

## 2023-01-08 NOTE — Progress Notes (Signed)
[place] Phone: [phone] Subjective:   Christine [name], am serving as a scribe for Christine. Hulan Saas.  I'm seeing this patient by the request  of:  Christine Crook, MD  CC: Neck and back pain foot pain follow-up  YBW:[id number]  Christine [name] is a 72 y.o. female coming in with complaint of L heel pain. Patient states that she continues to have pain in bottom of heel. After last shockwave session she developed a knot on lateral calcaneous. Patient has noticed that knot is turning black and blue.   Patient did exercise class on Tuesday and it was suppose to be chair yoga. Class was aerobic and she noticed the change in color in the foot post exercise.        Past Medical History:  Diagnosis Date   Acute bursitis of right shoulder 08/29/2018   Right shoulder injected in August 29, 2018   Allergic rhinitis    Closed displaced fracture of fifth metatarsal bone of left foot 03/16/2017   DEPRESSIVE DISORDER NOT ELSEWHERE CLASSIFIED 03/27/2010   Qualifier: Diagnosis of  By: Christine Darner MD, Christine [name]   Mr Christine Manila, NP , [facility] [place]     Diabetes ([place]) 04/02/2016   Diabetes mellitus    Essential hypertension 03/14/2008   Qualifier: Diagnosis of  By: Christine Darner MD, Christine [name]     Fibromyalgia 08/12/2011   Diagnosed by Christine Marveen Reeks , Rheumatologist 2010    GERD (gastroesophageal reflux disease) 12/25/2016   Gluteal tendonitis of right buttock 10/19/2017   Injected in October 19, 2017 Injected October 07, 2018   Greater trochanteric bursitis of left hip 06/17/2016   Greater trochanteric bursitis of right hip 11/27/2015   Injected 11/27/2015    History of recurrent UTIs    Christine [name]   HTN (hypertension)    Hx of osteopenia 10/11/2012   [facility]A; ordered by Christine Christine [name] Lowest T score: -2.1 @ lumbar spine @ [facility] 11/01/12; decrease of 8.5% vs 05/2009. There is 9.2% risk over 10 yrs History fracture left  elbow @ 4 Fractured left wrist , right elbow and nose post fall July/18/2013. Christine. [name] FH Osteoporosis [name] PMH of bisphosphonate therapy (generic Fosamax Rxed but not filled  due to polypharmacy )    Hyperlipidemia    Hyperlipidemia associated with type 2 diabetes mellitus ([place]) 01/18/2007   Qualifier: Diagnosis of  By: Christine [name]  With DM LDL goal = < 100, ideally < 70. Mi in father @ 39; 2 brothers @ 67 & 40     Lumbar radiculopathy 04/11/2008   Qualifier: Diagnosis of  By: Christine Darner MD, Christine [name] well to epidural 01/12/2017  repeat epidural given May 2018   Migraine headache    quiescent   Nonallopathic lesion of cervical region 07/19/2014   Nonallopathic lesion of lumbosacral region 01/15/2016   Nonallopathic lesion of sacral region 06/27/2018   Nonallopathic lesion-rib cage 09/26/2014   Palpitations 04/15/2011   Rotator cuff impingement syndrome of left shoulder 05/11/2014   Seasonal allergic rhinitis 03/21/2012   Sinusitis, chronic 04/20/2016   Sleep disorder    Christine [name]   Subluxation of peroneal tendon of right foot 11/10/2017   TIA (transient ischemic attack)    Christine [name], HX OF 02/02/2008   Qualifier: Diagnosis of  By: Christine [name] ADVRS EFF UNS RX MEDICINAL&BIOLOGICAL [facility] 02/02/2008   Qualifier: Diagnosis of  By: Christine Darner MD, Christine [name] AND MYOSITIS 02/02/2008   Qualifier: Diagnosis of  By: Christine Darner MD, Christine [name]     UTI (urinary tract infection) 10/05/2014   Viral upper respiratory tract infection with cough 12/31/2014   Vitamin D deficiency 05/25/2008   Qualifier: Diagnosis of  By: Christine Darner MD, Christine [name]     Past Surgical History:  Procedure Laterality Date   COLONOSCOPY     Christine [name]; hemorrhoids   CORONARY ANGIOPLASTY  12/15/1995   for chest pain- negative    KNEE ARTHROSCOPY Left 2023   POLYPECTOMY  12/14/2000   benign, hyperplastic polyp; rectal bleeding 2008   TONSILLECTOMY     TOTAL ABDOMINAL HYSTERECTOMY   12/14/1981   BSO for endometriosis   WISDOM TOOTH EXTRACTION     Social History   Socioeconomic History   Marital status: Married    Spouse name: Not on file   Number of children: 2   Years of education: Not on file   Highest education level: Not on file  Occupational History   Occupation: Designer, television/film set: Christine [name]  Tobacco Use   Smoking status: Never   Smokeless tobacco: Never  Vaping Use   Vaping Use: Never used  Substance and Sexual Activity   Alcohol use: [name]   Drug use: [name]   Sexual activity: Not on file  Other Topics Concern   Not on file  Social History Narrative   Regular exercise- [name]    Son dec 2011.  1 grand   Social Determinants of Health   Financial Resource Strain: Low Risk  (08/25/2022)   Overall Financial Resource Strain (CARDIA)    Difficulty of Paying Living Expenses: Not hard at all  Food Insecurity: [name] Food Insecurity (08/25/2022)   Hunger Vital Sign    Worried About Running Out of Food in the Last Year: Never true    Ran Out of Food in the Last Year: Never true  Transportation Needs: [name] Transportation Needs (08/25/2022)   PRAPARE - Hydrologist (Medical): [name]    Lack of Transportation (Non-Medical): [name]  Physical Activity: Inactive (08/25/2022)   Exercise Vital Sign    Days of Exercise per Week: 0 days    Minutes of Exercise per Session: 0 min  Stress: Stress Concern Present (08/25/2022)   [place]    Feeling of Stress : Rather much  Social Connections: Socially Integrated (08/25/2022)   Social Connection and Isolation Panel [NHANES]    Frequency of Communication with Friends and Family: More than three times a week    Frequency of Social Gatherings with Friends and Family: More than three times a week    Attends Religious Services: More than 4 times per year    Active Member of [employer] or Organizations: Yes    Attends Programme researcher, broadcasting/film/video: More than 4 times per year    Marital Status: Married   Allergies  Allergen Reactions   Vioxx [Rofecoxib] Other (See Comments)    Excess BP which caused TIA    Prednisone Other (See Comments)    Mental status changes with high-dose oral agent. Tolerates epidural steroid injections. [name] associated rash or fever   Macrodantin [Nitrofurantoin Macrocrystal] Hives   Sulfa Antibiotics     Hives   Colesevelam Other (See Comments)    Leg Pain   Family History  Problem Relation Age of Onset   Colon  cancer Maternal Aunt    Diabetes Paternal Uncle    Heart attack Father 79   COPD Mother    Lung cancer Brother        smoker   Mental illness Other        niece, committed suicide.   Heart attack Other        brother X 2; @ 3 & 39   Stroke Neg Hx     Current Outpatient Medications (Endocrine & Metabolic):    Semaglutide, 2 MG/DOSE, (OZEMPIC, 2 MG/DOSE,) 8 MG/3ML SOPN, Inject 2 mg into the skin once a week. Gets from pt assist  Current Outpatient Medications (Cardiovascular):    atorvastatin (LIPITOR) 40 MG tablet, Take 1 tablet (40 mg total) by mouth daily.   metoprolol succinate (TOPROL XL) 25 MG 24 hr tablet, Take 0.5 tablets (12.5 mg total) by mouth daily.  Current Outpatient Medications (Respiratory):    promethazine (PHENERGAN) 12.5 MG tablet, Take 1 tablet (12.5 mg total) by mouth every 8 (eight) hours as needed for nausea or vomiting.  Current Outpatient Medications (Analgesics):    acetaminophen (TYLENOL) 500 MG tablet, Take 500 mg by mouth as needed.   aspirin 81 MG tablet, Take 81 mg by mouth daily.   naproxen (NAPROSYN) 500 MG tablet, Take 1 tablet by mouth as needed.   traMADol (ULTRAM) 50 MG tablet, as needed.  Current Outpatient Medications (Hematological):    vitamin B-12 (CYANOCOBALAMIN) 1000 MCG tablet, Take 1,000 mcg by mouth daily.  Current Outpatient Medications (Other):    cephALEXin (KEFLEX) 250 MG capsule, Take 250 mg by mouth daily.  UTI   DULoxetine (CYMBALTA) 60 MG capsule, Take 60 mg by mouth daily.   Lancets (ONETOUCH ULTRASOFT) lancets, Check blood sugar once daily. Dx code: 250.00   LORazepam (ATIVAN) 0.5 MG tablet, Take 0.5 mg by mouth as needed for anxiety.   omeprazole (PRILOSEC) 20 MG capsule, TAKE 1 CAPSULE BY MOUTH EVERY DAY   ONETOUCH ULTRA test strip, USE 1 STRIP TWICE DAILY DX CODE E11.65   tiZANidine (ZANAFLEX) 4 MG tablet, TAKE 1 TABLET BY MOUTH AT BEDTIME.   traZODone (DESYREL) 50 MG tablet, TAKE 1/2 TO 1 TABLET BY MOUTH AT BEDTIME AS NEEDED FOR SLEEP   Vitamin D, Ergocalciferol, (DRISDOL) 1.25 MG (50000 UNIT) CAPS capsule, TAKE 1 CAPSULE BY MOUTH ONE TIME PER WEEK   Reviewed prior external information including notes and imaging from  primary care provider As well as notes that were available from care everywhere and other healthcare systems.  Past medical history, social, surgical and family history all reviewed in electronic medical record.  [name] pertanent information unless stated regarding to the chief complaint.   Review of Systems:  [name] headache, visual changes, nausea, vomiting, diarrhea, constipation, dizziness, abdominal pain, skin rash, fevers, chills, night sweats, weight loss, swollen lymph nodes, body aches, joint swelling, chest pain, shortness of breath, mood changes. POSITIVE muscle aches  Objective  Blood pressure 128/82, pulse 82, height '5\' 6"'$  (1.676 m), weight 141 lb (64 kg), SpO2 92 %.   General: [name] apparent distress alert and oriented x3 mood and affect normal, dressed appropriately.  HEENT: Pupils equal, extraocular movements intact  Respiratory: Patient's speak in full sentences and does not appear short of breath  Cardiovascular: [name] lower extremity edema, non tender, [name] erythema  Neck exam does have some loss of lordosis.  Some tenderness to palpation. C6-7.  Tightness with FABER test right greater than left.  Patient does have tightness noted in the  paraspinal musculature as  well.  Patient does have tightness with Corky Sox left greater than right.  Left hip exam does have what appears to be some calcific changes along the lateral aspect.  The patient also has some tenderness noted on the plantar area.   Limited muscular skeletal ultrasound was performed and interpreted by Hulan Saas, M  Limited ultrasound of patient's heel shows the patient still has calcific Noted but not involving true mass.  Seems to be more diffuse than what it is compared to the MRI.  With the largest aspect being 1.3 cm which is significantly smaller.  Osteopathic findings C2 flexed rotated and side bent right C4 flexed rotated and side bent left C7 flexed rotated and side bent left T3 extended rotated and side bent right inhaled third rib T9 extended rotated and side bent left L2 flexed rotated and side bent right Sacrum right on right   The above documentation has been reviewed and is accurate and complete Lyndal Pulley, DO    Impression and Recommendations:    The above documentation has been reviewed and is accurate and complete Lyndal Pulley, DO

## 2023-01-08 NOTE — Assessment & Plan Note (Signed)
Chronic problem with exacerbation.  Has been sometime since we have seen patient.  Discussed which activities to do and which ones to avoid.  Increase activity slowly.  Follow-up again in 6 to 8 weeks

## 2023-01-08 NOTE — Assessment & Plan Note (Signed)
Patient still has a calcific changes noted.  We discussed with wife has broke it up but it seems to be more diffuse at this moment.  Discussed the possibility of injection but in the soft tissue I do not know if I would actually be helpful.  We discussed icing regimen and home exercises as well. 2 avoid walking barefoot.  Discussed heat and massage  = Follow-up again in 6 to 8 weeks

## 2023-01-08 NOTE — Patient Instructions (Signed)
Try warm compression and massage to the heel I will look up more about shockwave Keep appt for now but we can move it back 2-4 weeks if doing well

## 2023-01-13 ENCOUNTER — Encounter: Payer: Self-pay | Admitting: Family Medicine

## 2023-01-19 ENCOUNTER — Ambulatory Visit (INDEPENDENT_AMBULATORY_CARE_PROVIDER_SITE_OTHER): Payer: Self-pay | Admitting: Family Medicine

## 2023-01-19 DIAGNOSIS — M79672 Pain in left foot: Secondary | ICD-10-CM

## 2023-01-19 DIAGNOSIS — M7989 Other specified soft tissue disorders: Secondary | ICD-10-CM

## 2023-01-19 NOTE — Patient Instructions (Signed)
Return as scheduled 

## 2023-01-19 NOTE — Progress Notes (Signed)
   Christine [name] [facility] [place] Phone: [phone]   Extracorporeal Shockwave Therapy Note    Patient is being treated today with ECSWT. Informed consent was obtained and patient tolerated procedure well.   Therapy performed by Lynne Leader  Condition treated: Calcific mass left plantar calcaneus Treatment preset used: Plantar fasciitis Energy used: 90 mJ Frequency used: 12 Hz Number of pulses: 2500 Treatment #1 of #4  Electronically signed by:  Christine [name] [facility] 10:29 AM 01/19/23

## 2023-01-26 ENCOUNTER — Ambulatory Visit: Payer: Medicare Other | Admitting: Family Medicine

## 2023-01-26 ENCOUNTER — Ambulatory Visit: Payer: Self-pay | Admitting: Family Medicine

## 2023-01-26 DIAGNOSIS — M7989 Other specified soft tissue disorders: Secondary | ICD-10-CM

## 2023-01-26 MED ORDER — TRAMADOL HCL 50 MG PO TABS: 50.0000 mg | ORAL_TABLET | Freq: Three times a day (TID) | ORAL | 0 refills | Status: DC | PRN

## 2023-01-26 NOTE — Progress Notes (Signed)
   Larinda But[facility] [place] Phone: [phone]   Extracorporeal Shockwave Therapy Note    Patient is being treated today with ECSWT. Informed consent was obtained and patient tolerated procedure well.   Therapy performed by Lynne Leader  Condition treated: Calcific mass left plantar calcaneus. Treatment preset used: Planter fasciitis Energy used: 70 mJ Frequency used: 10 Hz Number of pulses: 2500 Treatment #2 of #4  Electronically signed by:  Larinda Buttery [facility] 12:00 PM 01/26/23   She had worse pain with last week's procedure.  Reduced power to 70 which she tolerated better.

## 2023-01-26 NOTE — Patient Instructions (Signed)
Thank you for coming in today.   Tramadol for severe pain.   Return in 1 week.

## 2023-01-29 ENCOUNTER — Telehealth: Payer: Self-pay | Admitting: Family Medicine

## 2023-01-29 NOTE — Telephone Encounter (Signed)
Pt came in and picked up the Ozempic.

## 2023-01-29 NOTE — Telephone Encounter (Signed)
Called patient to let her know that patient assistance delivered her Ozempic today. Patient stated that she would come by the office to pick-up.

## 2023-02-02 ENCOUNTER — Ambulatory Visit: Payer: Self-pay | Admitting: Family Medicine

## 2023-02-02 DIAGNOSIS — M7989 Other specified soft tissue disorders: Secondary | ICD-10-CM

## 2023-02-02 NOTE — Progress Notes (Signed)
   Christine [name] [facility] [place] Phone: [phone]   Extracorporeal Shockwave Therapy Note    Patient is being treated today with ECSWT. Informed consent was obtained and patient tolerated procedure well.   Therapy performed by Lynne Leader  Condition treated: Calcification soft tissue plantar calcaneus left Treatment preset used: Planter fasciitis Energy used: 70 mJ Frequency used: 10 Hz Number of pulses: 2000 Treatment #3 of #4  Electronically signed by:  Christine [name] Sports Medicine 10:53 AM 02/02/23

## 2023-02-02 NOTE — Patient Instructions (Signed)
Thank you for coming in today.  Return in 1 week  If not better we will  xray and plan for biopsy.

## 2023-02-08 NOTE — Progress Notes (Unsigned)
   I, Peterson Lombard, LAT, ATC acting as a scribe for Lynne Leader, MD.  Christine [name] is a 72 y.o. female who presents to [place] [facility] today for cont'd L foot pain. Pt has now completed 4 treatments of ECSWT. ***  Dx imaging: 10/02/22 L heel MRI  09/11/22 L Os calcis XR  Pertinent review of systems: ***  Relevant historical information: ***   Exam:  LMP  (LMP Unknown)  General: Well Developed, well nourished, and in no acute distress.   MSK: ***    Lab and Radiology Results No results found for this or any previous visit (from the past 72 hour(s)). No results found.     Assessment and Plan: 72 y.o. female with ***   PDMP not reviewed this encounter. No orders of the defined types were placed in this encounter.  No orders of the defined types were placed in this encounter.    Discussed warning signs or symptoms. Please see discharge instructions. Patient expresses understanding.   ***

## 2023-02-09 ENCOUNTER — Ambulatory Visit (INDEPENDENT_AMBULATORY_CARE_PROVIDER_SITE_OTHER): Payer: Medicare Other | Admitting: Family Medicine

## 2023-02-09 ENCOUNTER — Ambulatory Visit (INDEPENDENT_AMBULATORY_CARE_PROVIDER_SITE_OTHER): Payer: Medicare Other

## 2023-02-09 ENCOUNTER — Ambulatory Visit: Payer: Self-pay | Admitting: Family Medicine

## 2023-02-09 VITALS — BP 130/80 | HR 95 | Ht 66.0 in | Wt 142.0 lb

## 2023-02-09 DIAGNOSIS — M79672 Pain in left foot: Secondary | ICD-10-CM

## 2023-02-09 DIAGNOSIS — M7732 Calcaneal spur, left foot: Secondary | ICD-10-CM | POA: Diagnosis not present

## 2023-02-09 DIAGNOSIS — M7989 Other specified soft tissue disorders: Secondary | ICD-10-CM | POA: Diagnosis not present

## 2023-02-09 NOTE — Patient Instructions (Addendum)
Thank you for coming in today.   You should hear from Dr Pollie Friar office.   Please get a CD of the xray and MRIs of the heel to take to Dr Pollie Friar office.   Let me know if you have a problem or if Dr Dorian Heckle is not your guy.

## 2023-02-11 NOTE — Progress Notes (Signed)
Left foot shows calcifications in the foot like we talked about in clinic.

## 2023-02-17 ENCOUNTER — Other Ambulatory Visit: Payer: Self-pay | Admitting: Family Medicine

## 2023-02-17 DIAGNOSIS — R2242 Localized swelling, mass and lump, left lower limb: Secondary | ICD-10-CM | POA: Diagnosis not present

## 2023-02-24 ENCOUNTER — Other Ambulatory Visit: Payer: Self-pay | Admitting: Orthopaedic Surgery

## 2023-02-24 DIAGNOSIS — M79672 Pain in left foot: Secondary | ICD-10-CM

## 2023-02-26 ENCOUNTER — Ambulatory Visit
Admission: RE | Admit: 2023-02-26 | Discharge: 2023-02-26 | Disposition: A | Discharge status: Home / Self Care | Payer: Medicare Other | Source: Ambulatory Visit | Attending: Orthopaedic Surgery | Admitting: Orthopaedic Surgery

## 2023-02-26 DIAGNOSIS — R2242 Localized swelling, mass and lump, left lower limb: Secondary | ICD-10-CM | POA: Diagnosis not present

## 2023-02-26 DIAGNOSIS — M7989 Other specified soft tissue disorders: Secondary | ICD-10-CM | POA: Diagnosis not present

## 2023-02-26 DIAGNOSIS — M79672 Pain in left foot: Secondary | ICD-10-CM

## 2023-03-03 DIAGNOSIS — R2242 Localized swelling, mass and lump, left lower limb: Secondary | ICD-10-CM | POA: Diagnosis not present

## 2023-03-23 ENCOUNTER — Ambulatory Visit: Payer: Medicare Other | Attending: Internal Medicine | Admitting: Internal Medicine

## 2023-03-23 VITALS — BP 128/82 | HR 81 | Ht 66.0 in | Wt 140.6 lb

## 2023-03-23 DIAGNOSIS — R42 Dizziness and giddiness: Secondary | ICD-10-CM

## 2023-03-23 NOTE — Progress Notes (Signed)
Office Visit    Patient Name: Christine [name] Date of Encounter: 03/23/2023   Patient presents for follow up of dizzienss  HPI    Christine [name] is a 72 y.o. female with past medical history significant for nonobstructive CAD, DM 2, hyperlipidemia, TIA, and palpitations presents today for follow-up appointment. Monitor in Aug 2022 SVT  nonsustained   PACS, PVCs less 1%  The pt was seen by Margaretha Glassing in Dec 2023  complaining of dizziness with standing   Had fallen  PCP added metoprolol and decreaed Cozaar  Pt was orthostatic    Recomm increased fluid/salt intake   I saw the pt in Jan 2024  At that time I recomm increased fluid intake  Since that Jan appt the pt says she is drinking more    Urine light   Says she is less dizzy  Denies CP  no palpitations      Past Medical History:  Diagnosis Date   Acute bursitis of right shoulder 08/29/2018   Right shoulder injected in August 29, 2018   Allergic rhinitis    Closed displaced fracture of fifth metatarsal bone of left foot 03/16/2017   DEPRESSIVE DISORDER NOT ELSEWHERE CLASSIFIED 03/27/2010   Qualifier: Diagnosis of  By: Alwyn Ren MD, Chrissie Noa   Mr Skeet Latch, NP , [facility] [place]     Diabetes ([facility]) 04/02/2016   Diabetes mellitus    Essential hypertension 03/14/2008   Qualifier: Diagnosis of  By: Alwyn Ren MD, [name]     Fibromyalgia 08/12/2011   Diagnosed by Dr Jimmy Footman , Rheumatologist 2010    GERD (gastroesophageal reflux disease) 12/25/2016   Gluteal tendonitis of right buttock 10/19/2017   Injected in October 19, 2017 Injected October 07, 2018   Greater trochanteric bursitis of left hip 06/17/2016   Greater trochanteric bursitis of right hip 11/27/2015   Injected 11/27/2015    History of recurrent UTIs    [name]   HTN (hypertension)    Hx of osteopenia 10/11/2012   [facility] DEXA; ordered by Dr Alwyn Ren Lowest T score: -2.1 @ lumbar spine @ [facility] 11/01/12; decrease of 8.5% vs 05/2009. There is 9.2% risk over 10  yrs History fracture left elbow @ 4 Fractured left wrist , right elbow and nose post fall July/18/2013. Dr. Melvyn Novas No FH Osteoporosis No PMH of bisphosphonate therapy (generic Fosamax Rxed but not filled  due to polypharmacy )    Hyperlipidemia    Hyperlipidemia associated with type 2 diabetes mellitus ([facility]) 01/18/2007   Qualifier: Diagnosis of  By: Daine Gip  With DM LDL goal = < 100, ideally < 70. Mi in father @ 93; 2 brothers @ 78 & 63     Lumbar radiculopathy 04/11/2008   Qualifier: Diagnosis of  By: Alwyn Ren MD, Christiane Ha well to epidural 01/12/2017  repeat epidural given May 2018   Migraine headache    quiescent   Nonallopathic lesion of cervical region 07/19/2014   Nonallopathic lesion of lumbosacral region 01/15/2016   Nonallopathic lesion of sacral region 06/27/2018   Nonallopathic lesion-rib cage 09/26/2014   Palpitations 04/15/2011   Rotator cuff impingement syndrome of left shoulder 05/11/2014   Seasonal allergic rhinitis 03/21/2012   Sinusitis, chronic 04/20/2016   Sleep disorder    Dr Dohmier   Subluxation of peroneal tendon of right foot 11/10/2017   TIA (transient ischemic attack)    TRANSIENT ISCHEMIC ATTACKS, HX OF 02/02/2008   Qualifier: Diagnosis of  By: Alesia Richards  [facility] ADVRS EFF UNS RX MEDICINAL&BIOLOGICAL [facility] 02/02/2008   Qualifier: Diagnosis of  By: Alwyn RenHopper MD, Myer PeerWilliam     UNSPECIFIED MYALGIA AND MYOSITIS 02/02/2008   Qualifier: Diagnosis of  By: Alwyn RenHopper MD, Chrissie NoaWilliam     UTI (urinary tract infection) 10/05/2014   Viral upper respiratory tract infection with cough 12/31/2014   Vitamin D deficiency 05/25/2008   Qualifier: Diagnosis of  By: Alwyn RenHopper MD, Chrissie NoaWilliam     Past Surgical History:  Procedure Laterality Date   COLONOSCOPY     Dr Leone PayorGessner; hemorrhoids   CORONARY ANGIOPLASTY  12/15/1995   for chest pain- negative    KNEE ARTHROSCOPY Left 2023   POLYPECTOMY  12/14/2000   benign, hyperplastic polyp; rectal bleeding 2008   TONSILLECTOMY     TOTAL  ABDOMINAL HYSTERECTOMY  12/14/1981   BSO for endometriosis   WISDOM TOOTH EXTRACTION      Allergies  Allergies  Allergen Reactions   Vioxx [Rofecoxib] Other (See Comments)    Excess BP which caused TIA    Prednisone Other (See Comments)    Mental status changes with high-dose oral agent. Tolerates epidural steroid injections. No associated rash or fever   Macrodantin [Nitrofurantoin Macrocrystal] Hives   Sulfa Antibiotics     Hives   Colesevelam Other (See Comments)    Leg Pain     EKGs/Labs/Other Studies Reviewed:   The following studies were reviewed today: LONG TERM MONITOR 07/2021: Study Highlights     Basic rhythm is normal sinus rhythm Non-sustained SVT at rates < 150 bpm. Longest 10 beats. PAC and PVC burden < 1% Poor correlation between symptoms and arrhythmia     CORONARY CTA 2021: IMPRESSION: 1. Coronary calcium score of 430. This was 90 percentile for age and sex matched control.   2. Normal coronary origin with right dominance.   3. Mild CAD noted in the mid LAD, D1 and mild Lcx.  CADRADS-2.   4. Study will be sent for FFR.   IMPRESSION: FFR findings not suggestive of obstructive CAD.  EKG:  EKG is not ordered today.    Recent Labs: 09/14/2022: ALT 24; BUN 20; Creatinine, Ser 0.87; Potassium 3.6; Sodium 135 09/30/2022: Hemoglobin 13.1; Platelets 238.0; TSH 1.76  Recent Lipid Panel    Component Value Date/Time   CHOL 187 09/30/2022 1153   TRIG 81.0 09/30/2022 1153   HDL 65.30 09/30/2022 1153   CHOLHDL 3 09/30/2022 1153   VLDL 16.2 09/30/2022 1153   LDLCALC 105 (H) 09/30/2022 1153    Home Medications   Current Meds  Medication Sig   acetaminophen (TYLENOL) 500 MG tablet Take 500 mg by mouth as needed.   aspirin 81 MG tablet Take 81 mg by mouth daily.   atorvastatin (LIPITOR) 40 MG tablet Take 1 tablet (40 mg total) by mouth daily.   cephALEXin (KEFLEX) 250 MG capsule Take 250 mg by mouth daily. UTI   DULoxetine (CYMBALTA) 60 MG capsule  Take 60 mg by mouth daily.   Lancets (ONETOUCH ULTRASOFT) lancets Check blood sugar once daily. Dx code: 250.00   LORazepam (ATIVAN) 0.5 MG tablet Take 0.5 mg by mouth as needed for anxiety.   metoprolol succinate (TOPROL-XL) 25 MG 24 hr tablet TAKE 1/2 TABLET BY MOUTH EVERY DAY   naproxen (NAPROSYN) 500 MG tablet Take 1 tablet by mouth as needed.   omeprazole (PRILOSEC) 20 MG capsule TAKE 1 CAPSULE BY MOUTH EVERY DAY   ONETOUCH ULTRA test strip USE 1 STRIP TWICE DAILY DX CODE E11.65   promethazine (  PHENERGAN) 12.5 MG tablet Take 1 tablet (12.5 mg total) by mouth every 8 (eight) hours as needed for nausea or vomiting.   Semaglutide, 2 MG/DOSE, (OZEMPIC, 2 MG/DOSE,) 8 MG/3ML SOPN Inject 2 mg into the skin once a week. Gets from pt assist   tiZANidine (ZANAFLEX) 4 MG tablet TAKE 1 TABLET BY MOUTH AT BEDTIME.   traMADol (ULTRAM) 50 MG tablet Take 1 tablet (50 mg total) by mouth every 8 (eight) hours as needed for severe pain.   traZODone (DESYREL) 50 MG tablet TAKE 1/2 TO 1 TABLET BY MOUTH AT BEDTIME AS NEEDED FOR SLEEP   vitamin B-12 (CYANOCOBALAMIN) 1000 MCG tablet Take 1,000 mcg by mouth daily.   Vitamin D, Ergocalciferol, (DRISDOL) 1.25 MG (50000 UNIT) CAPS capsule TAKE 1 CAPSULE BY MOUTH ONE TIME PER WEEK     Review of Systems      All other systems reviewed and are otherwise negative except as noted above.  Physical Exam    VS:  BP 128/82   Pulse 81   Ht 5\' 6"  (1.676 m)   Wt 140 lb 9.6 oz (63.8 kg)   LMP  (LMP Unknown)   SpO2 98%   BMI 22.69 kg/m  , BMI Body mass index is 22.69 kg/m.  Orthostatics  BP 140/82 P 78   Sitting   142/82  P 92    Standing 136/80 P 88 Standing 4 min   142/84  P 90    Wt Readings from Last 3 Encounters:  03/23/23 140 lb 9.6 oz (63.8 kg)  02/09/23 142 lb (64.4 kg)  01/08/23 141 lb (64 kg)     GEN:  Pt is  in no acute distress. HEENT: normal. Neck: Supple, no JVD Cardiac: RRR, no murmur.  No LE Edema  Respiratory: clear to auscultation  bilaterally. GI: Soft, nontender, nondistended.  Assessment & Plan    Orthostatic hypotension Pt is clnically improved   She has mild increase in HR with standing   Not orthostatic    Keep up wit hfluids    Continue Toprol XL  2  Hx tachycardia  PVCs  Continue Toprol  pt asymptomatic  3  CAD   Present on CT scan    Pt denies CP   4  HL    Need tighter control of LDL   Will confirm Lipitor use    If regular would add Zetia       Disposition: Follow up 1 month with Dietrich Pates, MD or APP.  Signed, Dietrich Pates, MD 03/23/2023, 11:58 AM [facility]

## 2023-03-24 ENCOUNTER — Other Ambulatory Visit: Payer: Medicare Other

## 2023-03-24 ENCOUNTER — Telehealth: Payer: Self-pay

## 2023-03-24 DIAGNOSIS — E1169 Type 2 diabetes mellitus with other specified complication: Secondary | ICD-10-CM

## 2023-03-24 DIAGNOSIS — Z79899 Other long term (current) drug therapy: Secondary | ICD-10-CM

## 2023-03-24 DIAGNOSIS — I2510 Atherosclerotic heart disease of native coronary artery without angina pectoris: Secondary | ICD-10-CM

## 2023-03-24 DIAGNOSIS — R42 Dizziness and giddiness: Secondary | ICD-10-CM

## 2023-03-24 MED ORDER — EZETIMIBE 10 MG PO TABS: 10.0000 mg | ORAL_TABLET | Freq: Every day | ORAL | 3 refills | Status: DC

## 2023-03-24 NOTE — Telephone Encounter (Signed)
Pt says she is taking her Lipitor 40 mg daily so per Dr Tenny Craw she will add Zetia and have NMR and Liver panel in 8 weeks...Marland KitchenMarland Kitchen6/12/24.

## 2023-03-25 ENCOUNTER — Encounter: Payer: Self-pay | Admitting: Internal Medicine

## 2023-03-25 ENCOUNTER — Telehealth: Payer: Self-pay | Admitting: *Deleted

## 2023-03-25 NOTE — Telephone Encounter (Signed)
Dr. Tenny Craw,  You saw this patient on 03/23/2023. Will you please comment on medical clearance for excision of left foot soft tissue mass scheduled for 04/08/2023?  Please route your response to [facility] Preop. I will communicate with requesting office once you have given recommendations.   Thank you!  Carlos Levering, NP

## 2023-03-25 NOTE — Telephone Encounter (Signed)
   Pre-operative Risk Assessment    Patient Name: Christine [name]  DOB: 1951/02/15 MRN: [id number]      Request for Surgical Clearance    Procedure:   EXCISION OF LEFT FOOT SOFT TISSUE MASS  Date of Surgery:  Clearance 04/08/23                                 Surgeon:  DR. Nicki Guadalajara Surgeon's Group or Practice Name:  [facility] Phone number:  [phone] ATTN: Barbara Cower Fax number:  [phone]   Type of Clearance Requested:   - Medical ; ASA    Type of Anesthesia:   CHOICE   Additional requests/questions:    Elpidio Anis   03/25/2023, 4:06 PM

## 2023-03-26 NOTE — Telephone Encounter (Signed)
Patient scheduled for foot surgery  From cardiac standpoint I think she is at low risk for major cardiac event    OK to proceed.

## 2023-03-26 NOTE — Telephone Encounter (Signed)
   Name: Christine [name]  DOB: 1950/12/23  MRN: [id number]   Primary Cardiologist: Dietrich Pates, MD  Chart reviewed as part of pre-operative protocol coverage. Christine [name] was last seen on 03/23/2023 by Dr. Tenny Craw.  She was doing well at that time.  Per correspondence with Dr. Tenny Craw: "Patient scheduled for foot surgery From cardiac standpoint I think she is at low risk for major cardiac event  OK to proceed."  Therefore, based on ACC/AHA guidelines, the patient would be at acceptable risk for the planned procedure without further cardiovascular testing.   Ideally aspirin should be continued without interruption, however if the bleeding risk is too great, aspirin may be held for 5-7 days prior to surgery. Please resume aspirin post operatively when it is felt to be safe from a bleeding standpoint.    I will route this recommendation to the requesting party via Epic fax function and remove from pre-op pool. Please call with questions.  Carlos Levering, NP 03/26/2023, 10:13 AM

## 2023-04-01 ENCOUNTER — Ambulatory Visit (INDEPENDENT_AMBULATORY_CARE_PROVIDER_SITE_OTHER): Payer: Medicare Other | Admitting: Family Medicine

## 2023-04-01 ENCOUNTER — Encounter: Payer: Self-pay | Admitting: Family Medicine

## 2023-04-01 VITALS — BP 130/80 | HR 78 | Temp 98.0°F | Ht 66.0 in | Wt 139.1 lb

## 2023-04-01 DIAGNOSIS — E785 Hyperlipidemia, unspecified: Secondary | ICD-10-CM | POA: Diagnosis not present

## 2023-04-01 DIAGNOSIS — Z7984 Long term (current) use of oral hypoglycemic drugs: Secondary | ICD-10-CM | POA: Diagnosis not present

## 2023-04-01 DIAGNOSIS — M7989 Other specified soft tissue disorders: Secondary | ICD-10-CM

## 2023-04-01 DIAGNOSIS — R42 Dizziness and giddiness: Secondary | ICD-10-CM | POA: Diagnosis not present

## 2023-04-01 DIAGNOSIS — E559 Vitamin D deficiency, unspecified: Secondary | ICD-10-CM | POA: Diagnosis not present

## 2023-04-01 DIAGNOSIS — E1169 Type 2 diabetes mellitus with other specified complication: Secondary | ICD-10-CM | POA: Diagnosis not present

## 2023-04-01 DIAGNOSIS — E1165 Type 2 diabetes mellitus with hyperglycemia: Secondary | ICD-10-CM | POA: Diagnosis not present

## 2023-04-01 DIAGNOSIS — I10 Essential (primary) hypertension: Secondary | ICD-10-CM | POA: Diagnosis not present

## 2023-04-01 LAB — CBC WITH DIFFERENTIAL/PLATELET
Basophils Absolute: 0.1 10*3/uL (ref 0.0–0.1)
Basophils Relative: 1 % (ref 0.0–3.0)
Eosinophils Absolute: 0.1 10*3/uL (ref 0.0–0.7)
Eosinophils Relative: 2.1 % (ref 0.0–5.0)
HCT: 40.4 % (ref 36.0–46.0)
Hemoglobin: 13.7 g/dL (ref 12.0–15.0)
Lymphocytes Relative: 36.3 % (ref 12.0–46.0)
Lymphs Abs: 2.3 10*3/uL (ref 0.7–4.0)
MCHC: 33.8 g/dL (ref 30.0–36.0)
MCV: 87.7 fl (ref 78.0–100.0)
Monocytes Absolute: 0.5 10*3/uL (ref 0.1–1.0)
Monocytes Relative: 7.5 % (ref 3.0–12.0)
Neutro Abs: 3.3 10*3/uL (ref 1.4–7.7)
Neutrophils Relative %: 53.1 % (ref 43.0–77.0)
Platelets: 264 10*3/uL (ref 150.0–400.0)
RBC: 4.61 Mil/uL (ref 3.87–5.11)
RDW: 13.5 % (ref 11.5–15.5)
WBC: 6.2 10*3/uL (ref 4.0–10.5)

## 2023-04-01 LAB — COMPREHENSIVE METABOLIC PANEL
ALT: 21 U/L (ref 0–35)
AST: 15 U/L (ref 0–37)
Albumin: 4.2 g/dL (ref 3.5–5.2)
Alkaline Phosphatase: 60 U/L (ref 39–117)
BUN: 19 mg/dL (ref 6–23)
CO2: 26 mEq/L (ref 19–32)
Calcium: 9.8 mg/dL (ref 8.4–10.5)
Chloride: 101 mEq/L (ref 96–112)
Creatinine, Ser: 0.76 mg/dL (ref 0.40–1.20)
GFR: 77.51 mL/min (ref >60.00)
Glucose, Bld: 127 mg/dL — ABNORMAL HIGH (ref 70–99)
Potassium: 3.9 mEq/L (ref 3.5–5.1)
Sodium: 136 mEq/L (ref 135–145)
Total Bilirubin: 0.5 mg/dL (ref 0.2–1.2)
Total Protein: 6.5 g/dL (ref 6.0–8.3)

## 2023-04-01 LAB — HEMOGLOBIN A1C: Hgb A1c MFr Bld: 7.4 % — ABNORMAL HIGH (ref 4.6–6.5)

## 2023-04-01 LAB — VITAMIN D 25 HYDROXY (VIT D DEFICIENCY, FRACTURES): VITD: 41.74 ng/mL (ref 30.00–100.00)

## 2023-04-01 NOTE — Assessment & Plan Note (Signed)
Chronic.  Not controlled.  Continue Lipitor 40 mg.  Start zetia 10 mg after surgery   has follow up labs from Cardiology 2 months after.

## 2023-04-01 NOTE — Assessment & Plan Note (Signed)
Chronic.  Taking vitamin D 16109 weekly.  Hasn't had labs long time.  Check D

## 2023-04-01 NOTE — Progress Notes (Signed)
Subjective:     Patient ID: Christine [name], female    DOB: December 14, 1951, 72 y.o.   MRN: [id number]  Chief Complaint  Patient presents with   Medical Management of Chronic Issues    3 month follow-up on dm and HTN Not fasting     HPI  HYPERTENSION-Pt is on metoprolol 12.5.  Bp's running  130-140/?.  No ha/cp/palp/edema/cough/shortness of breath.   Dizzy(light-headed)-like last night when putting sheets on bed. Not at rest.  More when active.  Not daily.  More this week.  pressure a lot in frontal area-chronic runny nose.  Pressure frontal.  Daily headache(s) for short time.  On chronic keflex-due to chronic urinary tract infection. Cold chills frequently and at times, feels like coming down w/something  had ocular migraine other day.  Happened more since COVID February 2023 but more so since Fall in November.   DM type 2-on Ozempic 2 mg weekly, Lipitor 40. Sugars 106-141. Zetia to be started after foot surgery Left foot mass-needs clearance.  Calcification in achilles-pain and not getting better. Surgery next week(s).  HLD-on atorvastatin 40.  Card added zetia but patient will wait till after surgery  Health Maintenance Due  Topic Date Due   COLONOSCOPY (Pts 45-5yrs Insurance coverage will need to be confirmed)  11/02/2017   DTaP/Tdap/Td (2 - Tdap) 06/08/2019    Past Medical History:  Diagnosis Date   Acute bursitis of right shoulder 08/29/2018   Right shoulder injected in August 29, 2018   Allergic rhinitis    Closed displaced fracture of fifth metatarsal bone of left foot 03/16/2017   DEPRESSIVE DISORDER NOT ELSEWHERE CLASSIFIED 03/27/2010   Qualifier: Diagnosis of  By: Alwyn Ren MD, Chrissie Noa   Mr Skeet Latch, NP , [facility] [place]     Diabetes 04/02/2016   Diabetes mellitus    Essential hypertension 03/14/2008   Qualifier: Diagnosis of  By: Alwyn Ren MD, [name]     Fibromyalgia 08/12/2011   Diagnosed by Dr Jimmy Footman , Rheumatologist 2010    GERD (gastroesophageal  reflux disease) 12/25/2016   Gluteal tendonitis of right buttock 10/19/2017   Injected in October 19, 2017 Injected October 07, 2018   Greater trochanteric bursitis of left hip 06/17/2016   Greater trochanteric bursitis of right hip 11/27/2015   Injected 11/27/2015    History of recurrent UTIs    [name]   HTN (hypertension)    Hx of osteopenia 10/11/2012   [facility] DEXA; ordered by Dr Alwyn Ren Lowest T score: -2.1 @ lumbar spine @ [facility] 11/01/12; decrease of 8.5% vs 05/2009. There is 9.2% risk over 10 yrs History fracture left elbow @ 4 Fractured left wrist , right elbow and nose post fall July/18/2013. Dr. Melvyn Novas No FH Osteoporosis No PMH of bisphosphonate therapy (generic Fosamax Rxed but not filled  due to polypharmacy )    Hyperlipidemia    Hyperlipidemia associated with type 2 diabetes mellitus 01/18/2007   Qualifier: Diagnosis of  By: Daine Gip  With DM LDL goal = < 100, ideally < 70. Mi in father @ 47; 2 brothers @ 31 & 48     Lumbar radiculopathy 04/11/2008   Qualifier: Diagnosis of  By: Alwyn Ren MD, Christiane Ha well to epidural 01/12/2017  repeat epidural given May 2018   Migraine headache    quiescent   Nonallopathic lesion of cervical region 07/19/2014   Nonallopathic lesion of lumbosacral region 01/15/2016   Nonallopathic lesion of sacral region 06/27/2018   Nonallopathic lesion-rib cage  09/26/2014   Palpitations 04/15/2011   Rotator cuff impingement syndrome of left shoulder 05/11/2014   Seasonal allergic rhinitis 03/21/2012   Sinusitis, chronic 04/20/2016   Sleep disorder    Dr Dohmier   Subluxation of peroneal tendon of right foot 11/10/2017   TIA (transient ischemic attack)    TRANSIENT ISCHEMIC ATTACKS, HX OF 02/02/2008   Qualifier: Diagnosis of  By: Magda Bernheim ADVRS EFF UNS RX MEDICINAL&BIOLOGICAL SBSTNC 02/02/2008   Qualifier: Diagnosis of  By: Alwyn Ren MD, Myer Peer AND MYOSITIS 02/02/2008   Qualifier: Diagnosis of  By: Alwyn Ren MD,  [name]     UTI (urinary tract infection) 10/05/2014   Viral upper respiratory tract infection with cough 12/31/2014   Vitamin D deficiency 05/25/2008   Qualifier: Diagnosis of  By: Alwyn Ren MD, Chrissie Noa      Past Surgical History:  Procedure Laterality Date   COLONOSCOPY     Dr Leone Payor; hemorrhoids   CORONARY ANGIOPLASTY  12/15/1995   for chest pain- negative    KNEE ARTHROSCOPY Left 2023   POLYPECTOMY  12/14/2000   benign, hyperplastic polyp; rectal bleeding 2008   TONSILLECTOMY     TOTAL ABDOMINAL HYSTERECTOMY  12/14/1981   BSO for endometriosis   WISDOM TOOTH EXTRACTION       Current Outpatient Medications:    acetaminophen (TYLENOL) 500 MG tablet, Take 500 mg by mouth as needed., Disp: , Rfl:    aspirin 81 MG tablet, Take 81 mg by mouth daily., Disp: , Rfl:    atorvastatin (LIPITOR) 40 MG tablet, Take 1 tablet (40 mg total) by mouth daily., Disp: 90 tablet, Rfl: 3   cephALEXin (KEFLEX) 250 MG capsule, Take 250 mg by mouth daily. UTI, Disp: , Rfl:    DULoxetine (CYMBALTA) 60 MG capsule, Take 60 mg by mouth daily., Disp: , Rfl:    Lancets (ONETOUCH ULTRASOFT) lancets, Check blood sugar once daily. Dx code: 250.00, Disp: 100 each, Rfl: 12   LORazepam (ATIVAN) 0.5 MG tablet, Take 0.5 mg by mouth as needed for anxiety., Disp: , Rfl:    metoprolol succinate (TOPROL-XL) 25 MG 24 hr tablet, TAKE 1/2 TABLET BY MOUTH EVERY DAY, Disp: 45 tablet, Rfl: 1   naproxen (NAPROSYN) 500 MG tablet, Take 1 tablet by mouth as needed., Disp: , Rfl:    omeprazole (PRILOSEC) 20 MG capsule, TAKE 1 CAPSULE BY MOUTH EVERY DAY, Disp: 90 capsule, Rfl: 3   ONETOUCH ULTRA test strip, USE 1 STRIP TWICE DAILY DX CODE E11.65, Disp: 100 strip, Rfl: 3   promethazine (PHENERGAN) 12.5 MG tablet, Take 1 tablet (12.5 mg total) by mouth every 8 (eight) hours as needed for nausea or vomiting., Disp: 20 tablet, Rfl: 8   Semaglutide, 2 MG/DOSE, (OZEMPIC, 2 MG/DOSE,) 8 MG/3ML SOPN, Inject 2 mg into the skin once a week. Gets  from pt assist, Disp: 9 mL, Rfl: 3   tiZANidine (ZANAFLEX) 4 MG tablet, TAKE 1 TABLET BY MOUTH AT BEDTIME., Disp: 90 tablet, Rfl: 1   traMADol (ULTRAM) 50 MG tablet, Take 1 tablet (50 mg total) by mouth every 8 (eight) hours as needed for severe pain., Disp: 15 tablet, Rfl: 0   traZODone (DESYREL) 50 MG tablet, TAKE 1/2 TO 1 TABLET BY MOUTH AT BEDTIME AS NEEDED FOR SLEEP, Disp: 90 tablet, Rfl: 1   vitamin B-12 (CYANOCOBALAMIN) 1000 MCG tablet, Take 1,000 mcg by mouth daily., Disp: , Rfl:    Vitamin D, Ergocalciferol, (DRISDOL) 1.25 MG (50000  UNIT) CAPS capsule, TAKE 1 CAPSULE BY MOUTH ONE TIME PER WEEK, Disp: 12 capsule, Rfl: 0   ezetimibe (ZETIA) 10 MG tablet, Take 1 tablet (10 mg total) by mouth daily. (Patient not taking: Reported on 04/01/2023), Disp: 90 tablet, Rfl: 3  Allergies  Allergen Reactions   Vioxx [Rofecoxib] Other (See Comments)    Excess BP which caused TIA    Prednisone Other (See Comments)    Mental status changes with high-dose oral agent. Tolerates epidural steroid injections. No associated rash or fever   Macrodantin [Nitrofurantoin Macrocrystal] Hives   Sulfa Antibiotics     Hives   Colesevelam Other (See Comments)    Leg Pain   ROS neg/noncontributory except as noted HPI/below      Objective:     BP 130/80   Pulse 78   Temp 98 F (36.7 C) (Temporal)   Ht  (1.676 m)   Wt 139 lb 2 oz (63.1 kg)   LMP  (LMP Unknown)   SpO2 97%   BMI 22.46 kg/m  Wt Readings from Last 3 Encounters:  04/01/23 139 lb 2 oz (63.1 kg)  03/23/23 140 lb 9.6 oz (63.8 kg)  02/09/23 142 lb (64.4 kg)    Physical Exam   Gen: WDWN NAD HEENT: NCAT, conjunctiva not injected, sclera nonicteric NECK:  supple, no thyromegaly, no nodes, no carotid bruits CARDIAC: RRR, S1S2+, no murmur. DP 2+B LUNGS: CTAB. No wheezes ABDOMEN:  BS+, soft, NTND, No HSM, no masses EXT:  no edema MSK: no gross abnormalities.  NEURO: A&O x3.  CN II-XII intact.  PSYCH: normal mood. Good eye  contact     Assessment & Plan:  Type 2 diabetes mellitus with hyperglycemia, without long-term current use of insulin Assessment & Plan: Chronic.  Has been well controlled.  Ophth UpToDate.  Still taking Ozempic 2 mg weekly.  Working on diet.  Check A1C, CMP.   Orders: -     CBC with Differential/Platelet -     Comprehensive metabolic panel -     Hemoglobin A1c  Essential hypertension Assessment & Plan: Chronic.  Well controlled.  Orthostasis improve.  Continue metoprolol 12.5(on more for tachycardia). Check CMP,cbc  Orders: -     CBC with Differential/Platelet -     Comprehensive metabolic panel  Hyperlipidemia associated with type 2 diabetes mellitus Assessment & Plan: Chronic.  Not controlled.  Continue Lipitor 40 mg.  Start zetia 10 mg after surgery   has follow up labs from Cardiology 2 months after.   Dizziness  Vitamin D deficiency Assessment & Plan: Chronic.  Taking vitamin D 16109 weekly.  Hasn't had labs long time.  Check D  Orders: -     VITAMIN D 25 Hydroxy (Vit-D Deficiency, Fractures)  Calcification of soft tissue  Dizziness-chronic.  Intermitt.  More w/activity.  Had 3 sessions w/vesbitular rehab in past.  Wants to wait for now.  May partly be migraine.  Has had imaging.  Continue to monitor  Calcification left achilles-having surgery in 1 week(s).  Medically lower risk.  Form filled out.  Await A1C.   Follow up 3 mo  Angelena Sole, MD

## 2023-04-01 NOTE — Progress Notes (Signed)
[name] [address] [place] [address] Phone: [phone] Subjective:   Christine [name], am serving as a scribe for Dr. Antoine Primas.  I'm seeing this patient by the request  of:  Jeani Sow, MD  CC: Neck and lower back pain  OZD:[id number]  01/08/2023 Chronic problem with exacerbation. Has been sometime since we have seen patient. Discussed which activities to do and which ones to avoid. Increase activity slowly. Follow-up again in 6 to 8 weeks   Patient still has a calcific changes noted.  We discussed with wife has broke it up but it seems to be more diffuse at this moment.  Discussed the possibility of injection but in the soft tissue I do not know if I would actually be helpful.  We discussed icing regimen and home exercises as well. 2 avoid walking barefoot.  Discussed heat and massage   = Follow-up again in 6 to 8 weeks  04/05/2023 Christine [name] is a 72 y.o. female coming in with complaint of heel and neck pain. No longer having pain. Pain was both sides of the spine. Insidious onset.   Having foot surgery on Thursday. Pain and swelling still occurring when she is weightbearing.     Past Medical History:  Diagnosis Date   Acute bursitis of right shoulder 08/29/2018   Right shoulder injected in August 29, 2018   Allergic rhinitis    Closed displaced fracture of fifth metatarsal bone of left foot 03/16/2017   DEPRESSIVE DISORDER NOT ELSEWHERE CLASSIFIED 03/27/2010   Qualifier: Diagnosis of  By: Alwyn Ren MD, Chrissie Noa   Mr Skeet Latch, NP , [facility] [place]     Diabetes 04/02/2016   Diabetes mellitus    Essential hypertension 03/14/2008   Qualifier: Diagnosis of  By: Alwyn Ren MD, [name]     Fibromyalgia 08/12/2011   Diagnosed by Dr Jimmy Footman , Rheumatologist 2010    GERD (gastroesophageal reflux disease) 12/25/2016   Gluteal tendonitis of right buttock 10/19/2017   Injected in October 19, 2017 Injected October 07, 2018   Greater trochanteric bursitis of left hip 06/17/2016   Greater trochanteric bursitis of right hip 11/27/2015   Injected 11/27/2015    History of recurrent UTIs    [name]   HTN (hypertension)    Hx of osteopenia 10/11/2012   [facility] DEXA; ordered by Dr Alwyn Ren Lowest T score: -2.1 @ lumbar spine @ [facility] 11/01/12; decrease of 8.5% vs 05/2009. There is 9.2% risk over 10 yrs History fracture left elbow @ 4 Fractured left wrist , right elbow and nose post fall July/18/2013. Dr. Melvyn Novas No FH Osteoporosis No PMH of bisphosphonate therapy (generic Fosamax Rxed but not filled  due to polypharmacy )    Hyperlipidemia    Hyperlipidemia associated with type 2 diabetes mellitus 01/18/2007   Qualifier: Diagnosis of  By: Daine Gip  With DM LDL goal = < 100, ideally < 70. Mi in father @ 32; 2 brothers @ 39 & 64     Lumbar radiculopathy 04/11/2008   Qualifier: Diagnosis of  By: Alwyn Ren MD, Christiane Ha well to epidural 01/12/2017  repeat epidural given May 2018   Migraine headache    quiescent   Nonallopathic lesion of cervical region 07/19/2014   Nonallopathic lesion of lumbosacral region 01/15/2016   Nonallopathic lesion of sacral region 06/27/2018   Nonallopathic lesion-rib cage 09/26/2014   Palpitations 04/15/2011   Rotator cuff impingement syndrome of left shoulder 05/11/2014  Seasonal allergic rhinitis 03/21/2012   Sinusitis, chronic 04/20/2016   Sleep disorder    Dr Dohmier   Subluxation of peroneal tendon of right foot 11/10/2017   TIA (transient ischemic attack)    TRANSIENT ISCHEMIC ATTACKS, HX OF 02/02/2008   Qualifier: Diagnosis of  By: Magda Bernheim ADVRS EFF UNS RX MEDICINAL&BIOLOGICAL SBSTNC 02/02/2008   Qualifier: Diagnosis of  By: Alwyn Ren MD, Myer Peer AND MYOSITIS 02/02/2008   Qualifier: Diagnosis of  By: Alwyn Ren MD, [name]     UTI (urinary tract infection) 10/05/2014   Viral upper respiratory tract infection with cough 12/31/2014   Vitamin D  deficiency 05/25/2008   Qualifier: Diagnosis of  By: Alwyn Ren MD, Chrissie Noa     Past Surgical History:  Procedure Laterality Date   COLONOSCOPY     Dr Leone Payor; hemorrhoids   CORONARY ANGIOPLASTY  12/15/1995   for chest pain- negative    KNEE ARTHROSCOPY Left 2023   POLYPECTOMY  12/14/2000   benign, hyperplastic polyp; rectal bleeding 2008   TONSILLECTOMY     TOTAL ABDOMINAL HYSTERECTOMY  12/14/1981   BSO for endometriosis   WISDOM TOOTH EXTRACTION     Social History   Socioeconomic History   Marital status: Married    Spouse name: Not on file   Number of children: 2   Years of education: Not on file   Highest education level: Not on file  Occupational History   Occupation: Secretary/administrator: [employer]  Tobacco Use   Smoking status: Never   Smokeless tobacco: Never  Vaping Use   Vaping Use: Never used  Substance and Sexual Activity   Alcohol use: No   Drug use: No   Sexual activity: Not on file  Other Topics Concern   Not on file  Social History Narrative   Regular exercise- no    Son dec 2011.  1 grand   Social Determinants of Health   Financial Resource Strain: Low Risk  (08/25/2022)   Overall Financial Resource Strain (CARDIA)    Difficulty of Paying Living Expenses: Not hard at all  Food Insecurity: No Food Insecurity (08/25/2022)   Hunger Vital Sign    Worried About Running Out of Food in the Last Year: Never true    Ran Out of Food in the Last Year: Never true  Transportation Needs: No Transportation Needs (08/25/2022)   PRAPARE - Administrator, Civil Service (Medical): No    Lack of Transportation (Non-Medical): No  Physical Activity: Inactive (08/25/2022)   Exercise Vital Sign    Days of Exercise per Week: 0 days    Minutes of Exercise per Session: 0 min  Stress: Stress Concern Present (08/25/2022)   [employer] of Occupational Health - Occupational Stress Questionnaire    Feeling of Stress : Rather much   Social Connections: Socially Integrated (08/25/2022)   Social Connection and Isolation Panel [NHANES]    Frequency of Communication with Friends and Family: More than three times a week    Frequency of Social Gatherings with Friends and Family: More than three times a week    Attends Religious Services: More than 4 times per year    Active Member of [employer] or Organizations: Yes    Attends Engineer, structural: More than 4 times per year    Marital Status: Married   Allergies  Allergen Reactions   Vioxx [Rofecoxib] Other (See Comments)  Excess BP which caused TIA    Prednisone Other (See Comments)    Mental status changes with high-dose oral agent. Tolerates epidural steroid injections. No associated rash or fever   Macrodantin [Nitrofurantoin Macrocrystal] Hives   Sulfa Antibiotics     Hives   Colesevelam Other (See Comments)    Leg Pain   Family History  Problem Relation Age of Onset   Colon cancer Maternal Aunt    Diabetes Paternal Uncle    Heart attack Father 41   COPD Mother    Lung cancer Brother        smoker   Mental illness Other        niece, committed suicide.   Heart attack Other        brother X 2; @ 50 & 52   Stroke Neg Hx     Current Outpatient Medications (Endocrine & Metabolic):    Semaglutide, 2 MG/DOSE, (OZEMPIC, 2 MG/DOSE,) 8 MG/3ML SOPN, Inject 2 mg into the skin once a week. Gets from pt assist  Current Outpatient Medications (Cardiovascular):    atorvastatin (LIPITOR) 40 MG tablet, Take 1 tablet (40 mg total) by mouth daily.   ezetimibe (ZETIA) 10 MG tablet, Take 1 tablet (10 mg total) by mouth daily.   metoprolol succinate (TOPROL-XL) 25 MG 24 hr tablet, TAKE 1/2 TABLET BY MOUTH EVERY DAY  Current Outpatient Medications (Respiratory):    promethazine (PHENERGAN) 12.5 MG tablet, Take 1 tablet (12.5 mg total) by mouth every 8 (eight) hours as needed for nausea or vomiting.  Current Outpatient Medications (Analgesics):    acetaminophen  (TYLENOL) 500 MG tablet, Take 500 mg by mouth as needed.   aspirin 81 MG tablet, Take 81 mg by mouth daily.   naproxen (NAPROSYN) 500 MG tablet, Take 1 tablet by mouth as needed.   traMADol (ULTRAM) 50 MG tablet, Take 1 tablet (50 mg total) by mouth every 8 (eight) hours as needed for severe pain.  Current Outpatient Medications (Hematological):    vitamin B-12 (CYANOCOBALAMIN) 1000 MCG tablet, Take 1,000 mcg by mouth daily.  Current Outpatient Medications (Other):    cephALEXin (KEFLEX) 250 MG capsule, Take 250 mg by mouth daily. UTI   DULoxetine (CYMBALTA) 60 MG capsule, Take 60 mg by mouth daily.   Lancets (ONETOUCH ULTRASOFT) lancets, Check blood sugar once daily. Dx code: 250.00   LORazepam (ATIVAN) 0.5 MG tablet, Take 0.5 mg by mouth as needed for anxiety.   omeprazole (PRILOSEC) 20 MG capsule, TAKE 1 CAPSULE BY MOUTH EVERY DAY   ONETOUCH ULTRA test strip, USE 1 STRIP TWICE DAILY DX CODE E11.65   tiZANidine (ZANAFLEX) 4 MG tablet, TAKE 1 TABLET BY MOUTH AT BEDTIME.   traZODone (DESYREL) 50 MG tablet, TAKE 1/2 TO 1 TABLET BY MOUTH AT BEDTIME AS NEEDED FOR SLEEP   Vitamin D, Ergocalciferol, (DRISDOL) 1.25 MG (50000 UNIT) CAPS capsule, TAKE 1 CAPSULE BY MOUTH ONE TIME PER WEEK   Reviewed prior external information including notes and imaging from  primary care provider As well as notes that were available from care everywhere and other healthcare systems.  Past medical history, social, surgical and family history all reviewed in electronic medical record.  No pertanent information unless stated regarding to the chief complaint.   Review of Systems:  No headache, visual changes, nausea, vomiting, diarrhea, constipation, dizziness, abdominal pain, skin rash, fevers, chills, night sweats, weight loss, swollen lymph nodes, body aches, joint swelling, chest pain, shortness of breath, mood changes. POSITIVE muscle aches  Objective  Blood pressure 132/88, pulse 83, height 5\' 6"  (1.676 m),  weight 142 lb (64.4 kg), SpO2 97 %.   General: No apparent distress alert and oriented x3 mood and affect normal, dressed appropriately.  HEENT: Pupils equal, extraocular movements intact  Respiratory: Patient's speak in full sentences and does not appear short of breath  Cardiovascular: No lower extremity edema, non tender, no erythema  Neck exam shows patient does have some loss of lordosis.  Patient does have some tenderness to palpation of the paraspinal musculature.  There are some limited sidebending bilaterally.  I does have some tenderness over the greater trochanteric area right greater than left.  Osteopathic findings C2 flexed rotated and side bent right C4 flexed rotated and side bent left C6 flexed rotated and side bent left T3 extended rotated and side bent right inhaled third rib T9 extended rotated and side bent left L2 flexed rotated and side bent right Sacrum right on right     Impression and Recommendations:     The above documentation has been reviewed and is accurate and complete Judi Saa, DO

## 2023-04-01 NOTE — Patient Instructions (Addendum)
Magnesium 250-400 mg daily.  Consider stopping diet drinks.  Consider CT again.  Tetanus, Diphtheria, and Pertussis (Tdap)-get at pharmacy

## 2023-04-01 NOTE — Assessment & Plan Note (Signed)
Chronic.  Has been well controlled.  Ophth UpToDate.  Still taking Ozempic 2 mg weekly.  Working on diet.  Check A1C, CMP.

## 2023-04-01 NOTE — Assessment & Plan Note (Signed)
Chronic.  Well controlled.  Orthostasis improve.  Continue metoprolol 12.5(on more for tachycardia). Check CMP,cbc

## 2023-04-01 NOTE — Progress Notes (Signed)
Normal/stable labs.  A1c is just in goal.  Of course, continue working on diet.  Try doing some upper body exercise since I know you will be limited on your lower body.  Stay on weekly Ozempic for now.

## 2023-04-05 ENCOUNTER — Encounter: Payer: Self-pay | Admitting: Family Medicine

## 2023-04-05 ENCOUNTER — Ambulatory Visit (INDEPENDENT_AMBULATORY_CARE_PROVIDER_SITE_OTHER): Payer: Medicare Other | Admitting: Family Medicine

## 2023-04-05 VITALS — BP 132/88 | HR 83 | Ht 66.0 in | Wt 142.0 lb

## 2023-04-05 DIAGNOSIS — M7989 Other specified soft tissue disorders: Secondary | ICD-10-CM

## 2023-04-05 DIAGNOSIS — M9901 Segmental and somatic dysfunction of cervical region: Secondary | ICD-10-CM | POA: Diagnosis not present

## 2023-04-05 DIAGNOSIS — M9904 Segmental and somatic dysfunction of sacral region: Secondary | ICD-10-CM | POA: Diagnosis not present

## 2023-04-05 DIAGNOSIS — M9902 Segmental and somatic dysfunction of thoracic region: Secondary | ICD-10-CM | POA: Diagnosis not present

## 2023-04-05 DIAGNOSIS — M797 Fibromyalgia: Secondary | ICD-10-CM | POA: Diagnosis not present

## 2023-04-05 DIAGNOSIS — M9908 Segmental and somatic dysfunction of rib cage: Secondary | ICD-10-CM

## 2023-04-05 DIAGNOSIS — M9903 Segmental and somatic dysfunction of lumbar region: Secondary | ICD-10-CM | POA: Diagnosis not present

## 2023-04-05 DIAGNOSIS — M5010 Cervical disc disorder with radiculopathy, unspecified cervical region: Secondary | ICD-10-CM | POA: Diagnosis not present

## 2023-04-05 NOTE — Patient Instructions (Signed)
Great to see you You will do great with the doc with the hair See me 6 weeks after surgery

## 2023-04-05 NOTE — Assessment & Plan Note (Signed)
No change in medication at this time.  Continue to monitor.

## 2023-04-05 NOTE — Assessment & Plan Note (Signed)
Patient does have some limited sidebending noted. Patient does have tightness noted to the right FABER test as well we will continue to monitor.  Patient will be having surgery for the ankle and likely will cause an exacerbation.  Will see patient in 6 weeks after any type of surgery does occur.  Follow-up with me again in 6 to 8 weeks otherwise.

## 2023-04-05 NOTE — Assessment & Plan Note (Signed)
Having removal in the near future

## 2023-04-08 ENCOUNTER — Other Ambulatory Visit: Payer: Self-pay | Admitting: Otolaryngology

## 2023-04-08 DIAGNOSIS — D2122 Benign neoplasm of connective and other soft tissue of left lower limb, including hip: Secondary | ICD-10-CM | POA: Diagnosis not present

## 2023-04-08 DIAGNOSIS — R2242 Localized swelling, mass and lump, left lower limb: Secondary | ICD-10-CM | POA: Diagnosis not present

## 2023-04-08 DIAGNOSIS — M7989 Other specified soft tissue disorders: Secondary | ICD-10-CM | POA: Diagnosis not present

## 2023-04-08 HISTORY — PX: FOOT MASS EXCISION: SHX1663

## 2023-04-11 ENCOUNTER — Emergency Department (HOSPITAL_BASED_OUTPATIENT_CLINIC_OR_DEPARTMENT_OTHER)
Admission: EM | Admit: 2023-04-11 | Discharge: 2023-04-11 | Disposition: A | Discharge status: Home / Self Care | Payer: Medicare Other | Attending: Emergency Medicine | Admitting: Emergency Medicine

## 2023-04-11 ENCOUNTER — Encounter (HOSPITAL_BASED_OUTPATIENT_CLINIC_OR_DEPARTMENT_OTHER): Payer: Self-pay | Admitting: Emergency Medicine

## 2023-04-11 ENCOUNTER — Other Ambulatory Visit: Payer: Self-pay

## 2023-04-11 DIAGNOSIS — Z7982 Long term (current) use of aspirin: Secondary | ICD-10-CM | POA: Insufficient documentation

## 2023-04-11 DIAGNOSIS — Z5189 Encounter for other specified aftercare: Secondary | ICD-10-CM

## 2023-04-11 DIAGNOSIS — G8918 Other acute postprocedural pain: Secondary | ICD-10-CM | POA: Diagnosis not present

## 2023-04-11 DIAGNOSIS — M25572 Pain in left ankle and joints of left foot: Secondary | ICD-10-CM | POA: Insufficient documentation

## 2023-04-11 DIAGNOSIS — Z4801 Encounter for change or removal of surgical wound dressing: Secondary | ICD-10-CM | POA: Diagnosis not present

## 2023-04-11 NOTE — ED Triage Notes (Signed)
Pt reports left ankle surgery on Friday. Today took a shower and area got wet. When she stepped out the shower, noticed blood on the floor. Unsure if incision is still bleeding. Post-op shoe present on LLE. Denies new swelling, pain.

## 2023-04-11 NOTE — ED Notes (Addendum)
Pt refusing ED ortho boot. Wants specialty gauze wrap and ace wrap. And will use old ortho boot.  Discharge papers given to patient. Patient was on the phone trying to get a hold of ortho provider.

## 2023-04-11 NOTE — ED Provider Notes (Signed)
[facility] Provider Note   CSN: [id number] Arrival date & time: 04/11/23  1112     History  Chief Complaint  Patient presents with   Post-op Problem   Wound Check    Christine [name] is a 72 y.o. female.   Wound Check      72 year old female who presents to the emergency department for wound check.  The patient states that she had left ankle surgery on Friday.  She took a shower and the area got wet and she stepped out of the shower noticed some bleeding on the floor.  She is unsure if she had incision was bleeding or if her incision was still intact.  She endorses typical postoperative pain.  She has followed up with her surgeon outpatient.   Home Medications Prior to Admission medications   Medication Sig Start Date End Date Taking? Authorizing Provider  aspirin 81 MG tablet Take 81 mg by mouth daily.   Yes [provider]  atorvastatin (LIPITOR) 40 MG tablet Take 1 tablet (40 mg total) by mouth daily. 11/04/22  Yes Jeani Sow, MD  cephALEXin (KEFLEX) 250 MG capsule Take 250 mg by mouth daily. UTI 12/08/22  Yes [provider]  DULoxetine (CYMBALTA) 60 MG capsule Take 60 mg by mouth daily.   Yes [provider]  Lancets (ONETOUCH ULTRASOFT) lancets Check blood sugar once daily. Dx code: 250.00 11/08/13  Yes Pecola Lawless, MD  LORazepam (ATIVAN) 0.5 MG tablet Take 0.5 mg by mouth as needed for anxiety.   Yes [provider]  metoprolol succinate (TOPROL-XL) 25 MG 24 hr tablet TAKE 1/2 TABLET BY MOUTH EVERY DAY 02/17/23  Yes Jeani Sow, MD  omeprazole (PRILOSEC) 20 MG capsule TAKE 1 CAPSULE BY MOUTH EVERY DAY 07/01/22  Yes Jeani Sow, MD  [facility] ULTRA test strip USE 1 STRIP TWICE DAILY DX CODE E11.65 03/09/22  Yes Romero Belling, MD  oxycodone (OXY-IR) 5 MG capsule Take 5 mg by mouth every 4 (four) hours as needed.   Yes [provider]  Semaglutide, 2 MG/DOSE,  (OZEMPIC, 2 MG/DOSE,) 8 MG/3ML SOPN Inject 2 mg into the skin once a week. Gets from pt assist 12/18/21  Yes Romero Belling, MD  traMADol (ULTRAM) 50 MG tablet Take 1 tablet (50 mg total) by mouth every 8 (eight) hours as needed for severe pain. 01/26/23  Yes Rodolph Bong, MD  traZODone (DESYREL) 50 MG tablet TAKE 1/2 TO 1 TABLET BY MOUTH AT BEDTIME AS NEEDED FOR SLEEP 07/06/22  Yes Jeani Sow, MD  vitamin B-12 (CYANOCOBALAMIN) 1000 MCG tablet Take 1,000 mcg by mouth daily.   Yes [provider]  Vitamin D, Ergocalciferol, (DRISDOL) 1.25 MG (50000 UNIT) CAPS capsule TAKE 1 CAPSULE BY MOUTH ONE TIME PER WEEK 12/17/22  Yes Antoine Primas M, DO  acetaminophen (TYLENOL) 500 MG tablet Take 500 mg by mouth as needed.    [provider]  ezetimibe (ZETIA) 10 MG tablet Take 1 tablet (10 mg total) by mouth daily. 03/24/23   Pricilla Riffle, MD  naproxen (NAPROSYN) 500 MG tablet Take 1 tablet by mouth as needed.    [provider]  promethazine (PHENERGAN) 12.5 MG tablet Take 1 tablet (12.5 mg total) by mouth every 8 (eight) hours as needed for nausea or vomiting. 04/21/21   Myrlene Broker, MD  tiZANidine (ZANAFLEX) 4 MG tablet TAKE 1 TABLET BY MOUTH AT BEDTIME. 09/30/22   Katrinka Blazing,  Clifton Custard, DO      Allergies    Vioxx [rofecoxib], Prednisone, Macrodantin [nitrofurantoin macrocrystal], Sulfa antibiotics, and Colesevelam    Review of Systems   Review of Systems  All other systems reviewed and are negative.   Physical Exam Updated Vital Signs BP 138/73 (BP Location: Left Arm)   Pulse 89   Temp 97.7 F (36.5 C) (Oral)   Resp 17   Ht 5\' 6"  (1.676 m)   Wt 63 kg   LMP  (LMP Unknown)   SpO2 99%   BMI 22.44 kg/m  Physical Exam Vitals and nursing note reviewed.  Constitutional:      General: She is not in acute distress. HENT:     Head: Normocephalic and atraumatic.  Eyes:     Conjunctiva/sclera: Conjunctivae normal.     Pupils: Pupils are equal, round, and reactive  to light.  Cardiovascular:     Rate and Rhythm: Normal rate and regular rhythm.  Pulmonary:     Effort: Pulmonary effort is normal. No respiratory distress.  Abdominal:     General: There is no distension.     Tenderness: There is no guarding.  Musculoskeletal:        General: No deformity or signs of injury.     Cervical back: Neck supple.     Comments: Incision site clean, dry, intact without redness or discharge, no active bleeding, small lesion to the heel of the foot, not actively bleeding  Skin:    Findings: No lesion or rash.  Neurological:     General: No focal deficit present.     Mental Status: She is alert. Mental status is at baseline.     ED Results / Procedures / Treatments   Labs (all labs ordered are listed, but only abnormal results are displayed) Labs Reviewed - No data to display  EKG None  Radiology No results found.  Procedures Procedures    Medications Ordered in ED Medications - No data to display  ED Course/ Medical Decision Making/ A&P                             Medical Decision Making    72 year old female who presents to the emergency department for wound check.  The patient states that she had left ankle surgery on Friday.  She took a shower and the area got wet and she stepped out of the shower noticed some bleeding on the floor.  She is unsure if she had incision was bleeding or if her incision was still intact.  She endorses typical postoperative pain.  She has followed up with her surgeon outpatient.  On arrival, the patient was vitally stable, presenting for a wound check.  Her incision site is clean, dry, intact, no active areas of hemorrhage.  Small lesion to the heel of the foot likely the source of bleeding, currently hemostatic at this time.  No concern for wound dehiscence at this time.  Stable for continued outpatient follow-up with her surgeon postoperatively.  Final Clinical Impression(s) / ED Diagnoses Final diagnoses:   Visit for wound check  Post-operative pain    Rx / DC Orders ED Discharge Orders     None         Ernie Avena, MD 04/11/23 1253

## 2023-04-11 NOTE — Discharge Instructions (Addendum)
Follow-up with your surgeon regarding continued outpatient care postoperatively.  Your incision site was clean dry and intact today

## 2023-04-11 NOTE — ED Notes (Signed)
Patient reports pain to LLE to post-op site, dsg wet during shower this AM, dressing removed incision C/D/I w/o redness/discharge.

## 2023-04-11 NOTE — ED Notes (Signed)
Pt requested a petroleum dressing on surgical site, used nonstick dressing to cover that, then ace wrapped the foot for support and keeping bandages in place. Pt refused to use our boot, reapplied her own boot and felt better as she walked out of room under own power. Pt was apologetic for being argumentative with this EMT and RN. So complied with her dressing desires. She said she would follow up with orthopedics any ways.

## 2023-04-12 ENCOUNTER — Other Ambulatory Visit: Payer: Self-pay | Admitting: Family Medicine

## 2023-05-06 ENCOUNTER — Telehealth: Payer: Self-pay | Admitting: *Deleted

## 2023-05-06 NOTE — Telephone Encounter (Signed)
Called patient to let her know that her Ozempic was delivered to the office. Patient stated that she would be over this afternoon to pick it up.

## 2023-05-19 NOTE — Progress Notes (Unsigned)
[name] [address] [place] [address] Phone: [phone] Subjective:   INadine Counts, am serving as a scribe for Dr. Antoine Primas.  I'm seeing this patient by the request  of:  Jeani Sow, MD  CC: back and neck pain follow up   [id number]  Christine [name] is a 72 y.o. female coming in with complaint of back and neck pain. OMT 04/05/2023.  Patient did have ankle surgery 6 weeks ago.  This was for calcific removal in the foot.  Patient states same per usual. Hip pain from walking in boot after ankle surgery. Wants to talk to you about some dizziness and other symptoms she has been having.  Medications patient has been prescribed: Vit D  Taking: yes       Reviewed prior external information including notes and imaging from previsou exam, outside providers and external EMR if available.   As well as notes that were available from care everywhere and other healthcare systems.  Past medical history, social, surgical and family history all reviewed in electronic medical record.  No pertanent information unless stated regarding to the chief complaint.   Past Medical History:  Diagnosis Date   Acute bursitis of right shoulder 08/29/2018   Right shoulder injected in August 29, 2018   Allergic rhinitis    Closed displaced fracture of fifth metatarsal bone of left foot 03/16/2017   DEPRESSIVE DISORDER NOT ELSEWHERE CLASSIFIED 03/27/2010   Qualifier: Diagnosis of  By: Alwyn Ren MD, Chrissie Noa   Mr Skeet Latch, NP , [facility] [place] [place]     Diabetes (HCC) 04/02/2016   Diabetes mellitus    Essential hypertension 03/14/2008   Qualifier: Diagnosis of  By: Alwyn Ren MD, [name]     Fibromyalgia 08/12/2011   Diagnosed by Dr Jimmy Footman , Rheumatologist 2010    GERD (gastroesophageal reflux disease) 12/25/2016   Gluteal tendonitis of right buttock 10/19/2017   Injected in October 19, 2017 Injected October 07, 2018   Greater trochanteric  bursitis of left hip 06/17/2016   Greater trochanteric bursitis of right hip 11/27/2015   Injected 11/27/2015    History of recurrent UTIs    [name]   HTN (hypertension)    Hx of osteopenia 10/11/2012   [facility] DEXA; ordered by Dr Alwyn Ren Lowest T score: -2.1 @ lumbar spine @ [facility] 11/01/12; decrease of 8.5% vs 05/2009. There is 9.2% risk over 10 yrs History fracture left elbow @ 4 Fractured left wrist , right elbow and nose post fall July/18/2013. Dr. Melvyn Novas No FH Osteoporosis No PMH of bisphosphonate therapy (generic Fosamax Rxed but not filled  due to polypharmacy )    Hyperlipidemia    Hyperlipidemia associated with type 2 diabetes mellitus (HCC) 01/18/2007   Qualifier: Diagnosis of  By: Daine Gip  With DM LDL goal = < 100, ideally < 70. Mi in father @ 57; 2 brothers @ 65 & 97     Lumbar radiculopathy 04/11/2008   Qualifier: Diagnosis of  By: Alwyn Ren MD, Christiane Ha well to epidural 01/12/2017  repeat epidural given May 2018   Migraine headache    quiescent   Nonallopathic lesion of cervical region 07/19/2014   Nonallopathic lesion of lumbosacral region 01/15/2016   Nonallopathic lesion of sacral region 06/27/2018   Nonallopathic lesion-rib cage 09/26/2014   Palpitations 04/15/2011   Rotator cuff impingement syndrome of left shoulder 05/11/2014   Seasonal allergic rhinitis 03/21/2012   Sinusitis, chronic 04/20/2016   Sleep  disorder    Dr Dohmier   Subluxation of peroneal tendon of right foot 11/10/2017   TIA (transient ischemic attack)    TRANSIENT ISCHEMIC ATTACKS, HX OF 02/02/2008   Qualifier: Diagnosis of  By: Magda Bernheim ADVRS EFF UNS RX MEDICINAL&BIOLOGICAL SBSTNC 02/02/2008   Qualifier: Diagnosis of  By: Alwyn Ren MD, Myer Peer AND MYOSITIS 02/02/2008   Qualifier: Diagnosis of  By: Alwyn Ren MD, Chrissie Noa     UTI (urinary tract infection) 10/05/2014   Viral upper respiratory tract infection with cough 12/31/2014   Vitamin D deficiency 05/25/2008    Qualifier: Diagnosis of  By: Alwyn Ren MD, Chrissie Noa      Allergies  Allergen Reactions   Vioxx [Rofecoxib] Other (See Comments)    Excess BP which caused TIA    Prednisone Other (See Comments)    Mental status changes with high-dose oral agent. Tolerates epidural steroid injections. No associated rash or fever   Macrodantin [Nitrofurantoin Macrocrystal] Hives   Sulfa Antibiotics     Hives   Colesevelam Other (See Comments)    Leg Pain     Review of Systems:  No headache, visual changes, nausea, vomiting, diarrhea, constipation, dizziness, abdominal pain, skin rash, fevers, chills, night sweats, weight loss, swollen lymph nodes, body aches, joint swelling, chest pain, shortness of breath, mood changes. POSITIVE muscle aches  Objective  Blood pressure 122/84, pulse 100, height 5\' 6"  (1.676 m), weight 137 lb (62.1 kg), SpO2 98 %.   General: No apparent distress alert and oriented x3 mood and affect normal, dressed appropriately.  HEENT: Pupils equal, extraocular movements intact  Respiratory: Patient's speak in full sentences and does not appear short of breath  Cardiovascular: No lower extremity edema, non tender, no erythema  MSK:  Back shows loss lordosis noted.  Tenderness to palpation more in the thoracolumbar juncture.  Low back exam does have some tightness noted as well.  Neck exam does have some tightness noted.  Osteopathic findings  C3 flexed rotated and side bent right C7 flexed rotated and side bent left T3 extended rotated and side bent right inhaled rib T6 extended rotated and side bent left L1 flexed rotated and side bent right Sacrum right on right     Assessment and Plan:  Fibromyalgia Probably some mild exacerbation secondary to patient not being able to be as active postsurgical.  Patient did respond well to osteopathic manipulation.  Discussed which activities to do and which ones to avoid.  Increase activity noted.  Follow-up with me again in 6 to 8  weeks  Cervical disc disorder with radiculopathy of cervical region Chronic issues noted.  Discussed which activities to do and which ones to avoid.  Increase activity slowly.    Nonallopathic problems  Decision today to treat with OMT was based on Physical Exam  After verbal consent patient was treated with HVLA, ME, FPR techniques in cervical, rib, thoracic, lumbar, and sacral  areas  Patient tolerated the procedure well with improvement in symptoms  Patient given exercises, stretches and lifestyle modifications  See medications in patient instructions if given  Patient will follow up in 4-8 weeks    The above documentation has been reviewed and is accurate and complete Judi Saa, DO          Note: This dictation was prepared with Dragon dictation along with smaller phrase technology. Any transcriptional errors that result from this process are unintentional.

## 2023-05-20 ENCOUNTER — Ambulatory Visit (INDEPENDENT_AMBULATORY_CARE_PROVIDER_SITE_OTHER): Payer: Medicare Other | Admitting: Family Medicine

## 2023-05-20 VITALS — BP 122/84 | HR 100 | Ht 66.0 in | Wt 137.0 lb

## 2023-05-20 DIAGNOSIS — M9904 Segmental and somatic dysfunction of sacral region: Secondary | ICD-10-CM

## 2023-05-20 DIAGNOSIS — M5010 Cervical disc disorder with radiculopathy, unspecified cervical region: Secondary | ICD-10-CM | POA: Diagnosis not present

## 2023-05-20 DIAGNOSIS — M9903 Segmental and somatic dysfunction of lumbar region: Secondary | ICD-10-CM | POA: Diagnosis not present

## 2023-05-20 DIAGNOSIS — M797 Fibromyalgia: Secondary | ICD-10-CM

## 2023-05-20 DIAGNOSIS — M9908 Segmental and somatic dysfunction of rib cage: Secondary | ICD-10-CM | POA: Diagnosis not present

## 2023-05-20 DIAGNOSIS — M9902 Segmental and somatic dysfunction of thoracic region: Secondary | ICD-10-CM | POA: Diagnosis not present

## 2023-05-20 DIAGNOSIS — M9901 Segmental and somatic dysfunction of cervical region: Secondary | ICD-10-CM

## 2023-05-20 NOTE — Patient Instructions (Addendum)
Good to see you! Sparetime Ankle looks great Stop zetia for 5 days see if that helps See you again in 6-8 weeks

## 2023-05-20 NOTE — Assessment & Plan Note (Signed)
Probably some mild exacerbation secondary to patient not being able to be as active postsurgical.  Patient did respond well to osteopathic manipulation.  Discussed which activities to do and which ones to avoid.  Increase activity noted.  Follow-up with me again in 6 to 8 weeks

## 2023-05-20 NOTE — Assessment & Plan Note (Signed)
Chronic issues noted.  Discussed which activities to do and which ones to avoid.  Increase activity slowly.

## 2023-05-26 ENCOUNTER — Other Ambulatory Visit: Payer: Medicare Other

## 2023-05-28 DIAGNOSIS — F410 Panic disorder [episodic paroxysmal anxiety] without agoraphobia: Secondary | ICD-10-CM | POA: Diagnosis not present

## 2023-05-28 DIAGNOSIS — F339 Major depressive disorder, recurrent, unspecified: Secondary | ICD-10-CM | POA: Diagnosis not present

## 2023-06-16 ENCOUNTER — Telehealth: Payer: Self-pay | Admitting: Internal Medicine

## 2023-06-16 NOTE — Telephone Encounter (Signed)
Patient at husband's appt  We discussed screening colonoscopy - last in 2008  She is not interested in pursuing - aware of risks, benefits and indications  Did not want stool test either  Will dc in health maintenance

## 2023-07-01 ENCOUNTER — Encounter: Payer: Self-pay | Admitting: Family Medicine

## 2023-07-01 ENCOUNTER — Ambulatory Visit (INDEPENDENT_AMBULATORY_CARE_PROVIDER_SITE_OTHER): Payer: Medicare Other | Admitting: Family Medicine

## 2023-07-01 ENCOUNTER — Other Ambulatory Visit: Payer: Self-pay | Admitting: Family Medicine

## 2023-07-01 VITALS — BP 120/82 | HR 77 | Temp 98.0°F | Resp 16 | Ht 66.0 in | Wt 134.5 lb

## 2023-07-01 DIAGNOSIS — E1169 Type 2 diabetes mellitus with other specified complication: Secondary | ICD-10-CM | POA: Diagnosis not present

## 2023-07-01 DIAGNOSIS — I10 Essential (primary) hypertension: Secondary | ICD-10-CM

## 2023-07-01 DIAGNOSIS — E1165 Type 2 diabetes mellitus with hyperglycemia: Secondary | ICD-10-CM

## 2023-07-01 DIAGNOSIS — K219 Gastro-esophageal reflux disease without esophagitis: Secondary | ICD-10-CM

## 2023-07-01 DIAGNOSIS — Z7985 Long-term (current) use of injectable non-insulin antidiabetic drugs: Secondary | ICD-10-CM | POA: Diagnosis not present

## 2023-07-01 DIAGNOSIS — E785 Hyperlipidemia, unspecified: Secondary | ICD-10-CM | POA: Diagnosis not present

## 2023-07-01 DIAGNOSIS — I2510 Atherosclerotic heart disease of native coronary artery without angina pectoris: Secondary | ICD-10-CM | POA: Diagnosis not present

## 2023-07-01 LAB — COMPREHENSIVE METABOLIC PANEL
ALT: 29 U/L (ref 0–35)
AST: 21 U/L (ref 0–37)
Albumin: 4.2 g/dL (ref 3.5–5.2)
Alkaline Phosphatase: 67 U/L (ref 39–117)
BUN: 15 mg/dL (ref 6–23)
CO2: 28 mEq/L (ref 19–32)
Calcium: 10.1 mg/dL (ref 8.4–10.5)
Chloride: 101 mEq/L (ref 96–112)
Creatinine, Ser: 0.79 mg/dL (ref 0.40–1.20)
GFR: 73.87 mL/min (ref >60.00)
Glucose, Bld: 145 mg/dL — ABNORMAL HIGH (ref 70–99)
Potassium: 3.6 mEq/L (ref 3.5–5.1)
Sodium: 136 mEq/L (ref 135–145)
Total Bilirubin: 0.6 mg/dL (ref 0.2–1.2)
Total Protein: 6.7 g/dL (ref 6.0–8.3)

## 2023-07-01 LAB — LIPID PANEL
Cholesterol: 184 mg/dL (ref 0–200)
HDL: 53.6 mg/dL (ref >39.00)
LDL Cholesterol: 109 mg/dL — ABNORMAL HIGH (ref 0–99)
NonHDL: 130.35
Total CHOL/HDL Ratio: 3
Triglycerides: 108 mg/dL (ref 0.0–149.0)
VLDL: 21.6 mg/dL (ref 0.0–40.0)

## 2023-07-01 LAB — HEMOGLOBIN A1C: Hgb A1c MFr Bld: 7.1 % — ABNORMAL HIGH (ref 4.6–6.5)

## 2023-07-01 MED ORDER — ONETOUCH ULTRA VI STRP: ORAL_STRIP | 3 refills | Status: DC

## 2023-07-01 MED ORDER — OMEPRAZOLE 20 MG PO CPDR: DELAYED_RELEASE_CAPSULE | ORAL | 3 refills | Status: DC

## 2023-07-01 NOTE — Assessment & Plan Note (Signed)
Chronic.  Abnormal coronary CTA January 2021 without evidence of flow-limiting lesion by FFR.  Managed by Card.

## 2023-07-01 NOTE — Assessment & Plan Note (Signed)
Chronic.  LDL goal ideally <70.  Not at goal.  Zetia was added to Lipitor 40mg , but myalgias so stopped zetia.  Will check labs.  If not at goal, will d/w card if candidate Repatha/Praluent

## 2023-07-01 NOTE — Assessment & Plan Note (Signed)
Chronic.  Controlled on ozempic 2mg  weekly(gets from pt assistance).  Working on diet/exercise.  She would like to consider lowering her dosage with concerns about her weight. She will continue to inject, switching to 2 mg injections every 10 days.  Consider adding Jardiance(SED) if sugars too high or if we decide to stop ozempic.

## 2023-07-01 NOTE — Progress Notes (Signed)
Tagging Dr. Tenny Craw for cholesterol.  Pt got myalgias when zetia added so off.  Just on lipitor 40 Sugars better!

## 2023-07-01 NOTE — Progress Notes (Signed)
Subjective:     Patient ID: Christine [name], female    DOB: 1951/05/21, 72 y.o.   MRN: [id number]  Chief Complaint  Patient presents with   Medical Management of Chronic Issues    3 month follow-up on dm, htn, cholesterol Not fasting     HPI  DM/ Weight loss - She has continued to lose weight on Ozempic 2 mg to manage her DM-doesn't want to lose any more.  Is eating.  Monitors her sugars at home, has an occasional spike in the 150s. Checks sugars daily   Heart Burn - She states her current Ozempic 2 mg once per week has caused heart burn 1-2 days after injection. She has not experienced it in the past 2 weeks. When experiencing heart burn she takes TUMS with no relief and  daily omeprazole 20 mg that she states helps with acid reflux.    SOB - Patient states she sometimes experiences SOB. Denies wheezing   HLD - Patient was taking zetia 10 mg but discontinued after not tolerating it well-myalgias. She is still taking atorvastatin 40 mg once daily   Diarrhea - Patient states she had an episode of diarrhea on 7/16 with no weakness that resolved after 4 hrs.  Foot Mass - L foot surgery on 4/25. Was given oxycodone 5 mg every 4 hours PRN and is no longer taking it. Recovering well.   She complains of back pain which was accompanied by LLE swelling. States she has not been wearing compression socks recently due to hot weather. Swelling has resolved  Health Maintenance Due  Topic Date Due   DTaP/Tdap/Td (2 - Tdap) 06/08/2019   Medicare Annual Wellness (AWV)  08/26/2023    Past Medical History:  Diagnosis Date   Acute bursitis of right shoulder 08/29/2018   Right shoulder injected in August 29, 2018   Allergic rhinitis    Closed displaced fracture of fifth metatarsal bone of left foot 03/16/2017   DEPRESSIVE DISORDER NOT ELSEWHERE CLASSIFIED 03/27/2010   Qualifier: Diagnosis of  By: Alwyn Ren MD, Chrissie Noa   Mr Skeet Latch, NP , [facility] [place]     Diabetes (HCC)  04/02/2016   Diabetes mellitus    Essential hypertension 03/14/2008   Qualifier: Diagnosis of  By: Alwyn Ren MD, [name]     Fibromyalgia 08/12/2011   Diagnosed by Dr Jimmy Footman , Rheumatologist 2010    GERD (gastroesophageal reflux disease) 12/25/2016   Gluteal tendonitis of right buttock 10/19/2017   Injected in October 19, 2017 Injected October 07, 2018   Greater trochanteric bursitis of left hip 06/17/2016   Greater trochanteric bursitis of right hip 11/27/2015   Injected 11/27/2015    History of recurrent UTIs    [name]   HTN (hypertension)    Hx of osteopenia 10/11/2012   [facility] DEXA; ordered by Dr Alwyn Ren Lowest T score: -2.1 @ lumbar spine @ [facility] 11/01/12; decrease of 8.5% vs 05/2009. There is 9.2% risk over 10 yrs History fracture left elbow @ 4 Fractured left wrist , right elbow and nose post fall July/18/2013. Dr. Melvyn Novas No FH Osteoporosis No PMH of bisphosphonate therapy (generic Fosamax Rxed but not filled  due to polypharmacy )    Hyperlipidemia    Hyperlipidemia associated with type 2 diabetes mellitus (HCC) 01/18/2007   Qualifier: Diagnosis of  By: Daine Gip  With DM LDL goal = < 100, ideally < 70. Mi in father @ 29; 2 brothers @ 62 & 58  Lumbar radiculopathy 04/11/2008   Qualifier: Diagnosis of  By: Alwyn Ren MD, Christiane Ha well to epidural 01/12/2017  repeat epidural given May 2018   Migraine headache    quiescent   Nonallopathic lesion of cervical region 07/19/2014   Nonallopathic lesion of lumbosacral region 01/15/2016   Nonallopathic lesion of sacral region 06/27/2018   Nonallopathic lesion-rib cage 09/26/2014   Palpitations 04/15/2011   Rotator cuff impingement syndrome of left shoulder 05/11/2014   Seasonal allergic rhinitis 03/21/2012   Sinusitis, chronic 04/20/2016   Sleep disorder    Dr Dohmier   Subluxation of peroneal tendon of right foot 11/10/2017   TIA (transient ischemic attack)    TRANSIENT ISCHEMIC ATTACKS, HX OF 02/02/2008   Qualifier: Diagnosis of  By:  Magda Bernheim ADVRS EFF UNS RX MEDICINAL&BIOLOGICAL SBSTNC 02/02/2008   Qualifier: Diagnosis of  By: Alwyn Ren MD, Myer Peer AND MYOSITIS 02/02/2008   Qualifier: Diagnosis of  By: Alwyn Ren MD, [name]     UTI (urinary tract infection) 10/05/2014   Viral upper respiratory tract infection with cough 12/31/2014   Vitamin D deficiency 05/25/2008   Qualifier: Diagnosis of  By: Alwyn Ren MD, Chrissie Noa      Past Surgical History:  Procedure Laterality Date   COLONOSCOPY     Dr Leone Payor; hemorrhoids   CORONARY ANGIOPLASTY  12/15/1995   for chest pain- negative    FOOT MASS EXCISION Left 04/08/2023   KNEE ARTHROSCOPY Left 2023   POLYPECTOMY  12/14/2000   benign, hyperplastic polyp; rectal bleeding 2008   TONSILLECTOMY     TOTAL ABDOMINAL HYSTERECTOMY  12/14/1981   BSO for endometriosis   WISDOM TOOTH EXTRACTION       Current Outpatient Medications:    acetaminophen (TYLENOL) 500 MG tablet, Take 500 mg by mouth as needed., Disp: , Rfl:    aspirin 81 MG tablet, Take 81 mg by mouth daily., Disp: , Rfl:    atorvastatin (LIPITOR) 40 MG tablet, Take 1 tablet (40 mg total) by mouth daily., Disp: 90 tablet, Rfl: 3   cephALEXin (KEFLEX) 250 MG capsule, Take 250 mg by mouth daily. UTI, Disp: , Rfl:    DULoxetine (CYMBALTA) 60 MG capsule, Take 60 mg by mouth daily., Disp: , Rfl:    Lancets (ONETOUCH ULTRASOFT) lancets, Check blood sugar once daily. Dx code: 250.00, Disp: 100 each, Rfl: 12   LORazepam (ATIVAN) 0.5 MG tablet, Take 0.5 mg by mouth as needed for anxiety., Disp: , Rfl:    metoprolol succinate (TOPROL-XL) 25 MG 24 hr tablet, TAKE 1/2 TABLET BY MOUTH EVERY DAY, Disp: 45 tablet, Rfl: 1   naproxen (NAPROSYN) 500 MG tablet, Take 1 tablet by mouth as needed., Disp: , Rfl:    ondansetron (ZOFRAN-ODT) 4 MG disintegrating tablet, Take 4 mg by mouth every 6 (six) hours as needed., Disp: , Rfl:    promethazine (PHENERGAN) 12.5 MG tablet, Take 1 tablet (12.5 mg total) by mouth  every 8 (eight) hours as needed for nausea or vomiting., Disp: 20 tablet, Rfl: 8   Semaglutide, 2 MG/DOSE, (OZEMPIC, 2 MG/DOSE,) 8 MG/3ML SOPN, Inject 2 mg into the skin once a week. Gets from pt assist, Disp: 9 mL, Rfl: 3   tiZANidine (ZANAFLEX) 4 MG tablet, TAKE 1 TABLET BY MOUTH AT BEDTIME., Disp: 90 tablet, Rfl: 1   traMADol (ULTRAM) 50 MG tablet, Take 1 tablet (50 mg total) by mouth every 8 (eight) hours as needed for severe pain., Disp: 15 tablet,  Rfl: 0   traZODone (DESYREL) 50 MG tablet, TAKE 1/2 TO 1 TABLET BY MOUTH AT BEDTIME AS NEEDED FOR SLEEP, Disp: 90 tablet, Rfl: 1   vitamin B-12 (CYANOCOBALAMIN) 1000 MCG tablet, Take 1,000 mcg by mouth daily., Disp: , Rfl:    Vitamin D, Ergocalciferol, (DRISDOL) 1.25 MG (50000 UNIT) CAPS capsule, TAKE 1 CAPSULE BY MOUTH ONE TIME PER WEEK, Disp: 12 capsule, Rfl: 0   glucose blood (ONETOUCH ULTRA) test strip, 1 each by Other route daily at 12 noon. Use as instructed, Disp: 100 strip, Rfl: 3   omeprazole (PRILOSEC) 20 MG capsule, TAKE 1 CAPSULE BY MOUTH EVERY DAY, Disp: 90 capsule, Rfl: 3  Allergies  Allergen Reactions   Vioxx [Rofecoxib] Other (See Comments)    Excess BP which caused TIA    Prednisone Other (See Comments)    Mental status changes with high-dose oral agent. Tolerates epidural steroid injections. No associated rash or fever   Macrodantin [Nitrofurantoin Macrocrystal] Hives   Sulfa Antibiotics     Hives   Zetia [Ezetimibe]     myalgias   Colesevelam Other (See Comments)    Leg Pain   ROS neg/noncontributory except as noted HPI/below Still w/chronic dizziness-orthostatic hypotension-advised to wear compression stockings as much as possible despite heat.     Objective:     BP 120/82   Pulse 77   Temp 98 F (36.7 C) (Temporal)   Resp 16   Ht 5\' 6"  (1.676 m)   Wt 134 lb 8 oz (61 kg)   LMP  (LMP Unknown)   SpO2 97%   BMI 21.71 kg/m  Wt Readings from Last 3 Encounters:  07/01/23 134 lb 8 oz (61 kg)  05/20/23 137 lb  (62.1 kg)  04/11/23 139 lb (63 kg)    Physical Exam   Gen: WDWN NAD HEENT: NCAT, conjunctiva not injected, sclera nonicteric NECK:  supple, no thyromegaly, no nodes, no carotid bruits CARDIAC: RRR, S1S2+, no murmur. DP 2+B LUNGS: CTAB. No wheezes ABDOMEN:  BS+, soft, NTND, No HSM, no masses EXT:  no edema MSK: no gross abnormalities.  NEURO: A&O x3.  CN II-XII intact.  PSYCH: normal mood. Good eye contact     Assessment & Plan:  Type 2 diabetes mellitus with hyperglycemia, without long-term current use of insulin (HCC) Assessment & Plan: Chronic.  Controlled on ozempic 2mg  weekly(gets from pt assistance).  Working on diet/exercise.  She would like to consider lowering her dosage with concerns about her weight. She will continue to inject, switching to 2 mg injections every 10 days.  Consider adding Jardiance(SED) if sugars too high or if we decide to stop ozempic.   Orders: -     Comprehensive metabolic panel -     Lipid panel -     Hemoglobin A1c  Essential hypertension Assessment & Plan: Chronic.  Controlled w/wt loss.  On metoprolol more for tachycardia.   Atherosclerosis of native coronary artery of native heart without angina pectoris Assessment & Plan: Chronic.  Abnormal coronary CTA January 2021 without evidence of flow-limiting lesion by FFR.  Managed by Card.   Hyperlipidemia associated with type 2 diabetes mellitus (HCC) Assessment & Plan: Chronic.  LDL goal ideally <70.  Not at goal.  Zetia was added to Lipitor 40mg , but myalgias so stopped zetia.  Will check labs.  If not at goal, will d/w card if candidate Repatha/Praluent  Orders: -     Comprehensive metabolic panel -     Lipid panel  Gastroesophageal reflux  disease without esophagitis Assessment & Plan: Chronic.  Mostly controlled on prilosec 20mg  daily.  Suspect breakthru from ozempic.  Take tums or pepcid or second dose of omeprazole  Orders: -     Omeprazole; TAKE 1 CAPSULE BY MOUTH EVERY DAY   Dispense: 90 capsule; Refill: 3  Long-term current use of injectable noninsulin antidiabetic medication    Return in about 3 months (around 10/01/2023) for DM.   I,Rachel Rivera,acting as a scribe for Angelena Sole, MD.,have documented all relevant documentation on the behalf of Angelena Sole, MD,as directed by  Angelena Sole, MD while in the presence of Angelena Sole, MD.   I, Angelena Sole, MD, have reviewed all documentation for this visit. The documentation on 07/01/23 for the exam, diagnosis, procedures, and orders are all accurate and complete.    Angelena Sole, MD

## 2023-07-01 NOTE — Assessment & Plan Note (Signed)
Chronic.  Controlled w/wt loss.  On metoprolol more for tachycardia.

## 2023-07-01 NOTE — Assessment & Plan Note (Signed)
Chronic.  Mostly controlled on prilosec 20mg  daily.  Suspect breakthru from ozempic.  Take tums or pepcid or second dose of omeprazole

## 2023-07-01 NOTE — Patient Instructions (Addendum)
It was very nice to see you today!  Try to play with ozempic.  Try every 10 days. Max 2 weeks.  PLEASE NOTE:  If you had any lab tests please let us know if you have not heard back within a few days. You may see your results on MyChart before we have a chance to review them but we will give you a call once they are reviewed by [place]. If we ordered any referrals today, please let us know if you have not heard from their office within the next week.   Please try these tips to maintain a healthy lifestyle:  Eat most of your calories during the day when you are active. Eliminate processed foods including packaged sweets (pies, cakes, cookies), reduce intake of potatoes, white bread, white pasta, and white rice. Look for whole grain options, oat flour or almond flour.  Each meal should contain half fruits/vegetables, one quarter protein, and one quarter carbs (no bigger than a computer mouse).  Cut down on sweet beverages. This includes juice, soda, and sweet tea. Also watch fruit intake, though this is a healthier sweet option, it still contains natural sugar! Limit to 3 servings daily.  Drink at least 1 glass of water with each meal and aim for at least 8 glasses per day  Exercise at least 150 minutes every week.

## 2023-07-05 ENCOUNTER — Other Ambulatory Visit: Payer: Medicare Other

## 2023-07-06 ENCOUNTER — Other Ambulatory Visit: Payer: Self-pay

## 2023-07-06 DIAGNOSIS — E1169 Type 2 diabetes mellitus with other specified complication: Secondary | ICD-10-CM

## 2023-07-06 NOTE — Progress Notes (Signed)
Amb ref

## 2023-07-06 NOTE — Progress Notes (Unsigned)
[name] [address] [place] [address] Phone: [phone] Subjective:   Christine [name], am serving as a scribe for Dr. Antoine Primas.  I'm seeing this patient by the request  of:  Christine Sow, MD  CC: Neck and back pain follow-up  [id number]  Christine [name] is a 72 y.o. female coming in with complaint of back and neck pain. OMT 05/20/2023. Patient states that she had an increase in pain but used muscle relaxers to help her pain. Patient was unable to sit or stand due to pain.   Medications patient has been prescribed: Vit D  Taking:         Reviewed prior external information including notes and imaging from previsou exam, outside providers and external EMR if available.   As well as notes that were available from care everywhere and other healthcare systems.  Past medical history, social, surgical and family history all reviewed in electronic medical record.  No pertanent information unless stated regarding to the chief complaint.   Past Medical History:  Diagnosis Date   Acute bursitis of right shoulder 08/29/2018   Right shoulder injected in August 29, 2018   Allergic rhinitis    Closed displaced fracture of fifth metatarsal bone of left foot 03/16/2017   DEPRESSIVE DISORDER NOT ELSEWHERE CLASSIFIED 03/27/2010   Qualifier: Diagnosis of  By: Alwyn Ren MD, Christine [name]   Mr Skeet Latch, NP , [facility] [place] [employer]     Diabetes (HCC) 04/02/2016   Diabetes mellitus    Essential hypertension 03/14/2008   Qualifier: Diagnosis of  By: Alwyn Ren MD, [name]     Fibromyalgia 08/12/2011   Diagnosed by Dr Jimmy Footman , Rheumatologist 2010    GERD (gastroesophageal reflux disease) 12/25/2016   Gluteal tendonitis of right buttock 10/19/2017   Injected in October 19, 2017 Injected October 07, 2018   Greater trochanteric bursitis of left hip 06/17/2016   Greater trochanteric bursitis of right hip 11/27/2015   Injected 11/27/2015     History of recurrent UTIs    [name]   HTN (hypertension)    Hx of osteopenia 10/11/2012   [facility] DEXA; ordered by Dr Alwyn Ren Lowest T score: -2.1 @ lumbar spine @ [facility] 11/01/12; decrease of 8.5% vs 05/2009. There is 9.2% risk over 10 yrs History fracture left elbow @ 4 Fractured left wrist , right elbow and nose post fall July/18/2013. Dr. Melvyn Novas No FH Osteoporosis No PMH of bisphosphonate therapy (generic Fosamax Rxed but not filled  due to polypharmacy )    Hyperlipidemia    Hyperlipidemia associated with type 2 diabetes mellitus (HCC) 01/18/2007   Qualifier: Diagnosis of  By: Daine Gip  With DM LDL goal = < 100, ideally < 70. Mi in father @ 62; 2 brothers @ 62 & 104     Lumbar radiculopathy 04/11/2008   Qualifier: Diagnosis of  By: Alwyn Ren MD, Christiane Ha well to epidural 01/12/2017  repeat epidural given May 2018   Migraine headache    quiescent   Nonallopathic lesion of cervical region 07/19/2014   Nonallopathic lesion of lumbosacral region 01/15/2016   Nonallopathic lesion of sacral region 06/27/2018   Nonallopathic lesion-rib cage 09/26/2014   Palpitations 04/15/2011   Rotator cuff impingement syndrome of left shoulder 05/11/2014   Seasonal allergic rhinitis 03/21/2012   Sinusitis, chronic 04/20/2016   Sleep disorder    Dr Dohmier   Subluxation of peroneal tendon of right foot 11/10/2017   TIA (transient ischemic  attack)    TRANSIENT ISCHEMIC ATTACKS, HX OF 02/02/2008   Qualifier: Diagnosis of  By: Magda Bernheim ADVRS EFF UNS RX MEDICINAL&BIOLOGICAL SBSTNC 02/02/2008   Qualifier: Diagnosis of  By: Alwyn Ren MD, Myer Peer AND MYOSITIS 02/02/2008   Qualifier: Diagnosis of  By: Alwyn Ren MD, Christine [name]     UTI (urinary tract infection) 10/05/2014   Viral upper respiratory tract infection with cough 12/31/2014   Vitamin D deficiency 05/25/2008   Qualifier: Diagnosis of  By: Alwyn Ren MD, Christine [name]      Allergies  Allergen Reactions   Vioxx [Rofecoxib] Other  (See Comments)    Excess BP which caused TIA    Prednisone Other (See Comments)    Mental status changes with high-dose oral agent. Tolerates epidural steroid injections. No associated rash or fever   Macrodantin [Nitrofurantoin Macrocrystal] Hives   Sulfa Antibiotics     Hives   Zetia [Ezetimibe]     myalgias   Colesevelam Other (See Comments)    Leg Pain     Review of Systems:  No headache, visual changes, nausea, vomiting, diarrhea, constipation, dizziness, abdominal pain, skin rash, fevers, chills, night sweats, weight loss, swollen lymph nodes, body aches, joint swelling, chest pain, shortness of breath, mood changes. POSITIVE muscle aches  Objective  Blood pressure 118/82, height 5\' 6"  (1.676 m), weight 136 lb (61.7 kg).   General: No apparent distress alert and oriented x3 mood and affect normal, dressed appropriately.  HEENT: Pupils equal, extraocular movements intact  Respiratory: Patient's speak in full sentences and does not appear short of breath  Cardiovascular: No lower extremity edema, non tender, no erythema  Neck exam does have some loss lordosis noted.  Some tenderness to palpation in the paraspinal musculature.  Osteopathic findings  C2 flexed rotated and side bent right C5 flexed rotated and side bent right T3 extended rotated and side bent right inhaled rib T7 extended rotated and side bent left inhaled rib L1 flexed rotated and side bent right Sacrum right on right       Assessment and Plan:  Cervical disc disorder with radiculopathy of cervical region Patient did have some radicular symptoms.  Did have worsening symptoms noted.  Discussed icing regimen and home exercises.  We discussed if any other flare to call us immediately.  Do not feel that advanced imaging is warranted at this time.  Responded well to osteopathic manipulation.  Patient will be back to her regular routine with the people out of town now that should be helpful as well.  Follow-up  again in 6 to 8 weeks    Nonallopathic problems  Decision today to treat with OMT was based on Physical Exam  After verbal consent patient was treated with HVLA, ME, FPR techniques in cervical, rib, thoracic, lumbar, and sacral  areas  Patient tolerated the procedure well with improvement in symptoms  Patient given exercises, stretches and lifestyle modifications  See medications in patient instructions if given  Patient will follow up in 4-8 weeks      The above documentation has been reviewed and is accurate and complete Judi Saa, DO        Note: This dictation was prepared with Dragon dictation along with smaller phrase technology. Any transcriptional errors that result from this process are unintentional.

## 2023-07-07 ENCOUNTER — Encounter: Payer: Self-pay | Admitting: Family Medicine

## 2023-07-07 ENCOUNTER — Ambulatory Visit (INDEPENDENT_AMBULATORY_CARE_PROVIDER_SITE_OTHER): Payer: Medicare Other | Admitting: Family Medicine

## 2023-07-07 VITALS — BP 118/82 | Ht 66.0 in | Wt 136.0 lb

## 2023-07-07 DIAGNOSIS — M9904 Segmental and somatic dysfunction of sacral region: Secondary | ICD-10-CM | POA: Diagnosis not present

## 2023-07-07 DIAGNOSIS — M9908 Segmental and somatic dysfunction of rib cage: Secondary | ICD-10-CM | POA: Diagnosis not present

## 2023-07-07 DIAGNOSIS — M9901 Segmental and somatic dysfunction of cervical region: Secondary | ICD-10-CM

## 2023-07-07 DIAGNOSIS — M5010 Cervical disc disorder with radiculopathy, unspecified cervical region: Secondary | ICD-10-CM | POA: Diagnosis not present

## 2023-07-07 DIAGNOSIS — M9902 Segmental and somatic dysfunction of thoracic region: Secondary | ICD-10-CM | POA: Diagnosis not present

## 2023-07-07 DIAGNOSIS — M9903 Segmental and somatic dysfunction of lumbar region: Secondary | ICD-10-CM

## 2023-07-07 NOTE — Patient Instructions (Signed)
Good to see you Sorry for the flare you had See me in 6 weeks

## 2023-07-07 NOTE — Assessment & Plan Note (Signed)
Patient did have some radicular symptoms.  Did have worsening symptoms noted.  Discussed icing regimen and home exercises.  We discussed if any other flare to call us immediately.  Do not feel that advanced imaging is warranted at this time.  Responded well to osteopathic manipulation.  Patient will be back to her regular routine with the people out of town now that should be helpful as well.  Follow-up again in 6 to 8 weeks

## 2023-07-16 LAB — HM DIABETES EYE EXAM

## 2023-07-27 ENCOUNTER — Ambulatory Visit: Payer: Medicare Other | Attending: Interventional Cardiology | Admitting: Pharmacist

## 2023-07-27 ENCOUNTER — Encounter: Payer: Self-pay | Admitting: Family Medicine

## 2023-07-27 DIAGNOSIS — E785 Hyperlipidemia, unspecified: Secondary | ICD-10-CM | POA: Insufficient documentation

## 2023-07-27 DIAGNOSIS — E1169 Type 2 diabetes mellitus with other specified complication: Secondary | ICD-10-CM | POA: Diagnosis not present

## 2023-07-27 NOTE — Progress Notes (Signed)
Patient ID: Christine [name]                 DOB: 08/16/1951                    MRN: [id number]      HPI: Christine [name] is a 72 y.o. female patient referred to lipid clinic by Dr. Tenny Craw. PMH is significant for nonobstructive CAD, DM 2, hyperlipidemia, TIA, and palpitations. CAC 430 (90th percentile). Last LCL-C 109. Did not tolerate zetia. Currently on atorvastatin 40mg .   Patient presents today to lipid clinic. She does not exercise much due to recent surgery on her foot. Tolerating atorvastatin well. Felt like she had the flu with zetia.    Reviewed options for lowering LDL cholesterol PCSK-9 inhibitors, bempedoic acid and inclisiran.  Discussed mechanisms of action, dosing, side effects and potential decreases in LDL cholesterol.  Also reviewed cost information.   Current Medications: atorvastatin 40mg  daily Intolerances: zetia (muscle aches) Risk Factors: DM, CAD, TIA, HTN LDL-C goal: <55 ApoB goal:   Diet: limits sweets, on ozmepic so doesn't eat much  Exercise: not much due to foot surgery  Family History:  Family History  Problem Relation Age of Onset   Colon cancer Maternal Aunt    Diabetes Paternal Uncle    Heart attack Father 89   COPD Mother    Lung cancer Brother        smoker   Mental illness Other        niece, committed suicide.   Heart attack Other        brother X 2; @ 50 & 52   Stroke Neg Hx     Social History: No ETOH, no tobacco  Labs: Lipid Panel     Component Value Date/Time   CHOL 184 07/01/2023 1135   TRIG 108.0 07/01/2023 1135   HDL 53.60 07/01/2023 1135   CHOLHDL 3 07/01/2023 1135   VLDL 21.6 07/01/2023 1135   LDLCALC 109 (H) 07/01/2023 1135    Past Medical History:  Diagnosis Date   Acute bursitis of right shoulder 08/29/2018   Right shoulder injected in August 29, 2018   Allergic rhinitis    Closed displaced fracture of fifth metatarsal bone of left foot 03/16/2017   DEPRESSIVE DISORDER NOT ELSEWHERE CLASSIFIED 03/27/2010    Qualifier: Diagnosis of  By: Alwyn Ren MD, Chrissie Noa   Mr Skeet Latch, NP , [facility] [place]     Diabetes (HCC) 04/02/2016   Diabetes mellitus    Essential hypertension 03/14/2008   Qualifier: Diagnosis of  By: Alwyn Ren MD, [name]     Fibromyalgia 08/12/2011   Diagnosed by Dr Jimmy Footman , Rheumatologist 2010    GERD (gastroesophageal reflux disease) 12/25/2016   Gluteal tendonitis of right buttock 10/19/2017   Injected in October 19, 2017 Injected October 07, 2018   Greater trochanteric bursitis of left hip 06/17/2016   Greater trochanteric bursitis of right hip 11/27/2015   Injected 11/27/2015    History of recurrent UTIs    [name]   HTN (hypertension)    Hx of osteopenia 10/11/2012   [facility] DEXA; ordered by Dr Alwyn Ren Lowest T score: -2.1 @ lumbar spine @ [facility] 11/01/12; decrease of 8.5% vs 05/2009. There is 9.2% risk over 10 yrs History fracture left elbow @ 4 Fractured left wrist , right elbow and nose post fall July/18/2013. Dr. Melvyn Novas No FH Osteoporosis No PMH of bisphosphonate therapy (generic Fosamax Rxed but not filled  due  to polypharmacy )    Hyperlipidemia    Hyperlipidemia associated with type 2 diabetes mellitus (HCC) 01/18/2007   Qualifier: Diagnosis of  By: Daine Gip  With DM LDL goal = < 100, ideally < 70. Mi in father @ 72; 2 brothers @ 35 & 51     Lumbar radiculopathy 04/11/2008   Qualifier: Diagnosis of  By: Alwyn Ren MD, Christiane Ha well to epidural 01/12/2017  repeat epidural given May 2018   Migraine headache    quiescent   Nonallopathic lesion of cervical region 07/19/2014   Nonallopathic lesion of lumbosacral region 01/15/2016   Nonallopathic lesion of sacral region 06/27/2018   Nonallopathic lesion-rib cage 09/26/2014   Palpitations 04/15/2011   Rotator cuff impingement syndrome of left shoulder 05/11/2014   Seasonal allergic rhinitis 03/21/2012   Sinusitis, chronic 04/20/2016   Sleep disorder    Dr Dohmier   Subluxation of peroneal tendon of right foot  11/10/2017   TIA (transient ischemic attack)    TRANSIENT ISCHEMIC ATTACKS, HX OF 02/02/2008   Qualifier: Diagnosis of  By: Magda Bernheim ADVRS EFF UNS RX MEDICINAL&BIOLOGICAL SBSTNC 02/02/2008   Qualifier: Diagnosis of  By: Alwyn Ren MD, Myer Peer AND MYOSITIS 02/02/2008   Qualifier: Diagnosis of  By: Alwyn Ren MD, [name]     UTI (urinary tract infection) 10/05/2014   Viral upper respiratory tract infection with cough 12/31/2014   Vitamin D deficiency 05/25/2008   Qualifier: Diagnosis of  By: Alwyn Ren MD, Chrissie Noa      Current Outpatient Medications on File Prior to Visit  Medication Sig Dispense Refill   acetaminophen (TYLENOL) 500 MG tablet Take 500 mg by mouth as needed.     aspirin 81 MG tablet Take 81 mg by mouth daily.     atorvastatin (LIPITOR) 40 MG tablet Take 1 tablet (40 mg total) by mouth daily. 90 tablet 3   cephALEXin (KEFLEX) 250 MG capsule Take 250 mg by mouth daily. UTI     DULoxetine (CYMBALTA) 60 MG capsule Take 60 mg by mouth daily.     glucose blood (ONETOUCH ULTRA) test strip 1 each by Other route daily at 12 noon. Use as instructed 100 strip 3   Lancets (ONETOUCH ULTRASOFT) lancets Check blood sugar once daily. Dx code: 250.00 100 each 12   LORazepam (ATIVAN) 0.5 MG tablet Take 0.5 mg by mouth as needed for anxiety.     metoprolol succinate (TOPROL-XL) 25 MG 24 hr tablet TAKE 1/2 TABLET BY MOUTH EVERY DAY 45 tablet 1   naproxen (NAPROSYN) 500 MG tablet Take 1 tablet by mouth as needed.     omeprazole (PRILOSEC) 20 MG capsule TAKE 1 CAPSULE BY MOUTH EVERY DAY 90 capsule 3   ondansetron (ZOFRAN-ODT) 4 MG disintegrating tablet Take 4 mg by mouth every 6 (six) hours as needed.     promethazine (PHENERGAN) 12.5 MG tablet Take 1 tablet (12.5 mg total) by mouth every 8 (eight) hours as needed for nausea or vomiting. 20 tablet 8   Semaglutide, 2 MG/DOSE, (OZEMPIC, 2 MG/DOSE,) 8 MG/3ML SOPN Inject 2 mg into the skin once a week. Gets from pt assist 9  mL 3   tiZANidine (ZANAFLEX) 4 MG tablet TAKE 1 TABLET BY MOUTH AT BEDTIME. 90 tablet 1   traMADol (ULTRAM) 50 MG tablet Take 1 tablet (50 mg total) by mouth every 8 (eight) hours as needed for severe pain. 15 tablet 0   traZODone (DESYREL) 50 MG tablet  TAKE 1/2 TO 1 TABLET BY MOUTH AT BEDTIME AS NEEDED FOR SLEEP 90 tablet 1   vitamin B-12 (CYANOCOBALAMIN) 1000 MCG tablet Take 1,000 mcg by mouth daily.     Vitamin D, Ergocalciferol, (DRISDOL) 1.25 MG (50000 UNIT) CAPS capsule TAKE 1 CAPSULE BY MOUTH ONE TIME PER WEEK 12 capsule 0   No current facility-administered medications on file prior to visit.    Allergies  Allergen Reactions   Vioxx [Rofecoxib] Other (See Comments)    Excess BP which caused TIA    Prednisone Other (See Comments)    Mental status changes with high-dose oral agent. Tolerates epidural steroid injections. No associated rash or fever   Macrodantin [Nitrofurantoin Macrocrystal] Hives   Sulfa Antibiotics     Hives   Zetia [Ezetimibe]     myalgias   Colesevelam Other (See Comments)    Leg Pain    Assessment/Plan:  1. Hyperlipidemia -  Hyperlipidemia associated with type 2 diabetes mellitus (HCC) Assessment: LDL-C is above goal of <55 We discussed why her LDL-C goal is <55 (per AACE guidelines) Reviewed options for lowering LDL cholesterol PCSK-9 inhibitors, bempedoic acid and inclisiran.  Discussed mechanisms of action, dosing, side effects and potential decreases in LDL cholesterol.  Also reviewed cost information.  Patient concerned if she did PCKS9i that she would get confused with her Ozempic.  Not much exercise. Walking hurts her foot.I encouraged her to get a desk cycle Discussed the CV data with PCSK9i and the lack of data for Leqvio (trial not complete)  Plan: Submit Leqvio start form- patient has started She will be referred to [facility] infusion center Continue atorvastatin 40mg  daily    Thank you,  Olene Floss, Pharm.D, BCACP, BCPS,  CPP [facility] of [facility] [address] [place], [place] [address]  Phone: [phone]; Fax: [phone]

## 2023-07-27 NOTE — Assessment & Plan Note (Addendum)
Assessment: LDL-C is above goal of <55 We discussed why her LDL-C goal is <55 (per AACE guidelines) Reviewed options for lowering LDL cholesterol PCSK-9 inhibitors, bempedoic acid and inclisiran.  Discussed mechanisms of action, dosing, side effects and potential decreases in LDL cholesterol.  Also reviewed cost information.  Patient concerned if she did PCKS9i that she would get confused with her Ozempic.  Not much exercise. Walking hurts her foot.I encouraged her to get a desk cycle Discussed the CV data with PCSK9i and the lack of data for Leqvio (trial not complete)  Plan: Submit Leqvio start form- patient has started She will be referred to [facility] infusion center Continue atorvastatin 40mg  daily

## 2023-07-27 NOTE — Patient Instructions (Signed)
I will submit your information to the [facility] service center. I will reach out to you when I hear back  Please call me with any questions

## 2023-07-29 ENCOUNTER — Encounter (INDEPENDENT_AMBULATORY_CARE_PROVIDER_SITE_OTHER): Payer: Self-pay

## 2023-07-29 ENCOUNTER — Other Ambulatory Visit: Payer: Self-pay | Admitting: Family Medicine

## 2023-07-30 ENCOUNTER — Other Ambulatory Visit: Payer: Self-pay | Admitting: Family Medicine

## 2023-08-09 ENCOUNTER — Telehealth: Payer: Self-pay | Admitting: Internal Medicine

## 2023-08-09 NOTE — Telephone Encounter (Signed)
Pt would like a callback from Pharmacist regarding suggested medication Leqvio. Please advise

## 2023-08-09 NOTE — Telephone Encounter (Signed)
Spoke with patient. States she saw that Leqvio increases blood sugar. I advised that this is not something it routinely does. Is not in package insert. Repatha does have reports of rare increases in blood sugar. Patient appreciative of the call back. Says she feels better. Advised the delay with the Leqvio service center is that her old medicare number was submitted but I have submitted the correct one today.

## 2023-08-13 NOTE — Telephone Encounter (Signed)
Patient has an active medicare plan F. Per service center they said they couldn't confirm the coverage. Unsure why. I have reached out to Wes to resolve this.

## 2023-08-17 ENCOUNTER — Other Ambulatory Visit: Payer: Self-pay | Admitting: Pharmacist

## 2023-08-18 NOTE — Progress Notes (Signed)
[name] [address] [place] [address] Phone: [phone] Subjective:   Bruce Donath, am serving as a scribe for Dr. Antoine Primas.  I'm seeing this patient by the request  of:  Jeani Sow, MD  CC: Neck pain, headache and dizziness  [id number]  Christine [name] is a 72 y.o. female coming in with complaint of back and neck pain. OMT 07/07/2023. 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 Has had headache for past few weeks.   Also wants to know what prominent tendon in R hand is as it is sore and swells as the day goes on.  Patient denies of any numbness or tingling in the hand.  Medications patient has been prescribed: None  Taking:         Reviewed prior external information including notes and imaging from previsou exam, outside providers and external EMR if available.   As well as notes that were available from care everywhere and other healthcare systems.  Past medical history, social, surgical and family history all reviewed in electronic medical record.  No pertanent information unless stated regarding to the chief complaint.   Past Medical History:  Diagnosis Date   Acute bursitis of right shoulder 08/29/2018   Right shoulder injected in August 29, 2018   Allergic rhinitis    Closed displaced fracture of fifth metatarsal bone of left foot 03/16/2017   DEPRESSIVE DISORDER NOT ELSEWHERE CLASSIFIED 03/27/2010   Qualifier: Diagnosis of  By: Alwyn Ren MD, Chrissie Noa   Mr Skeet Latch, NP , [facility] [place]     Diabetes (HCC) 04/02/2016   Diabetes mellitus    Essential hypertension 03/14/2008   Qualifier: Diagnosis of  By: Alwyn Ren MD, [name]     Fibromyalgia 08/12/2011   Diagnosed by Dr Jimmy Footman , Rheumatologist 2010    GERD (gastroesophageal reflux disease) 12/25/2016   Gluteal tendonitis of right buttock 10/19/2017   Injected in October 19, 2017 Injected October 07, 2018   Greater  trochanteric bursitis of left hip 06/17/2016   Greater trochanteric bursitis of right hip 11/27/2015   Injected 11/27/2015    History of recurrent UTIs    [name]   HTN (hypertension)    Hx of osteopenia 10/11/2012   [facility] DEXA; ordered by Dr Alwyn Ren Lowest T score: -2.1 @ lumbar spine @ [facility] 11/01/12; decrease of 8.5% vs 05/2009. There is 9.2% risk over 10 yrs History fracture left elbow @ 4 Fractured left wrist , right elbow and nose post fall July/18/2013. Dr. Melvyn Novas No FH Osteoporosis No PMH of bisphosphonate therapy (generic Fosamax Rxed but not filled  due to polypharmacy )    Hyperlipidemia    Hyperlipidemia associated with type 2 diabetes mellitus (HCC) 01/18/2007   Qualifier: Diagnosis of  By: Daine Gip  With DM LDL goal = < 100, ideally < 70. Mi in father @ 57; 2 brothers @ 66 & 55     Lumbar radiculopathy 04/11/2008   Qualifier: Diagnosis of  By: Alwyn Ren MD, Christiane Ha well to epidural 01/12/2017  repeat epidural given May 2018   Migraine headache    quiescent   Nonallopathic lesion of cervical region 07/19/2014   Nonallopathic lesion of lumbosacral region 01/15/2016   Nonallopathic lesion of sacral region 06/27/2018   Nonallopathic lesion-rib cage 09/26/2014   Palpitations 04/15/2011   Rotator cuff impingement syndrome of left shoulder 05/11/2014   Seasonal allergic rhinitis 03/21/2012   Sinusitis, chronic 04/20/2016  Sleep disorder    Dr Dohmier   Subluxation of peroneal tendon of right foot 11/10/2017   TIA (transient ischemic attack)    TRANSIENT ISCHEMIC ATTACKS, HX OF 02/02/2008   Qualifier: Diagnosis of  By: Magda Bernheim ADVRS EFF UNS RX MEDICINAL&BIOLOGICAL SBSTNC 02/02/2008   Qualifier: Diagnosis of  By: Alwyn Ren MD, Myer Peer AND MYOSITIS 02/02/2008   Qualifier: Diagnosis of  By: Alwyn Ren MD, Chrissie Noa     UTI (urinary tract infection) 10/05/2014   Viral upper respiratory tract infection with cough 12/31/2014   Vitamin D deficiency  05/25/2008   Qualifier: Diagnosis of  By: Alwyn Ren MD, Chrissie Noa      Allergies  Allergen Reactions   Vioxx [Rofecoxib] Other (See Comments)    Excess BP which caused TIA    Prednisone Other (See Comments)    Mental status changes with high-dose oral agent. Tolerates epidural steroid injections. No associated rash or fever   Macrodantin [Nitrofurantoin Macrocrystal] Hives   Sulfa Antibiotics     Hives   Zetia [Ezetimibe]     myalgias   Colesevelam Other (See Comments)    Leg Pain     Review of Systems:  No  visual changes, nausea, vomiting, diarrhea, constipation,  abdominal pain, skin rash, fevers, chills, night sweats, weight loss, swollen lymph nodes, body aches, joint swelling, chest pain, shortness of breath, mood changes. POSITIVE muscle aches, headache, dizziness  Objective  Blood pressure 124/82, pulse 73, height 5\' 6"  (1.676 m), weight 136 lb (61.7 kg), SpO2 94%.   General: No apparent distress alert and oriented x3 mood and affect normal, dressed appropriately.  HEENT: Pupils equal, extraocular movements intact  Respiratory: Patient's speak in full sentences and does not appear short of breath  Cardiovascular: No lower extremity edema, non tender, no erythema  Neck exam mild loss lordosis noted.  Tender to palpation in the paraspinal musculature.  Full strength though of the lower extremities in the upper extremities.  Patient does have fullness of the sinuses noted. Osteopathic findings  C2 flexed rotated and side bent right C6 flexed rotated and side bent left T3 extended rotated and side bent right inhaled rib T8 extended rotated and side bent left L1 flexed rotated and side bent right Sacrum right on right       Assessment and Plan:  Fibromyalgia I believe the patient is having more of a flare at the moment.  Not quite acting like herself.  Has had difficulty with B12 previously.  Discussed icing regimen and home exercises, discussed which activities to which  ones to avoid.  Increase activity slowly otherwise.  Follow-up with me again in 6 to 8 weeks  Dizziness Not a new problem.  Discussed with patient that with worsening symptoms seek medical attention.  Follow-up again with me in 6 to 8 weeks    Nonallopathic problems  Decision today to treat with OMT was based on Physical Exam  After verbal consent patient was treated with HVLA, ME, FPR techniques in cervical, rib, thoracic, lumbar, and sacral  areas  Patient tolerated the procedure well with improvement in symptoms  Patient given exercises, stretches and lifestyle modifications  See medications in patient instructions if given  Patient will follow up in 4-8 weeks     The above documentation has been reviewed and is accurate and complete Judi Saa, DO         Note: This dictation was prepared with Dragon dictation along  with smaller phrase technology. Any transcriptional errors that result from this process are unintentional.

## 2023-08-19 ENCOUNTER — Telehealth: Payer: Self-pay | Admitting: Pharmacy Technician

## 2023-08-19 NOTE — Telephone Encounter (Addendum)
 Auth Submission: NO AUTH NEEDED Site of care: Site of care: [facility] Payer: MEDICARE A/B + SILAC SUPP Medication & CPT/J Code(s) submitted: Leqvio  (Inclisiran) J1306 Route of submission (phone, fax, portal):  Phone # Fax # Auth type: Buy/Bill HB Units/visits requested: X2 Reference number:  Approval from: 08/19/23 to 01/13/26

## 2023-08-20 ENCOUNTER — Ambulatory Visit (INDEPENDENT_AMBULATORY_CARE_PROVIDER_SITE_OTHER): Payer: Medicare Other | Admitting: Family Medicine

## 2023-08-20 ENCOUNTER — Encounter: Payer: Self-pay | Admitting: Family Medicine

## 2023-08-20 VITALS — BP 124/82 | HR 73 | Ht 66.0 in | Wt 136.0 lb

## 2023-08-20 DIAGNOSIS — M7989 Other specified soft tissue disorders: Secondary | ICD-10-CM | POA: Diagnosis not present

## 2023-08-20 DIAGNOSIS — M797 Fibromyalgia: Secondary | ICD-10-CM

## 2023-08-20 DIAGNOSIS — R42 Dizziness and giddiness: Secondary | ICD-10-CM

## 2023-08-20 DIAGNOSIS — E538 Deficiency of other specified B group vitamins: Secondary | ICD-10-CM | POA: Diagnosis not present

## 2023-08-20 DIAGNOSIS — M9904 Segmental and somatic dysfunction of sacral region: Secondary | ICD-10-CM | POA: Diagnosis not present

## 2023-08-20 DIAGNOSIS — M9908 Segmental and somatic dysfunction of rib cage: Secondary | ICD-10-CM | POA: Diagnosis not present

## 2023-08-20 DIAGNOSIS — M9902 Segmental and somatic dysfunction of thoracic region: Secondary | ICD-10-CM | POA: Diagnosis not present

## 2023-08-20 DIAGNOSIS — M9903 Segmental and somatic dysfunction of lumbar region: Secondary | ICD-10-CM

## 2023-08-20 DIAGNOSIS — G43009 Migraine without aura, not intractable, without status migrainosus: Secondary | ICD-10-CM

## 2023-08-20 DIAGNOSIS — M9901 Segmental and somatic dysfunction of cervical region: Secondary | ICD-10-CM

## 2023-08-20 MED ORDER — CYANOCOBALAMIN 1000 MCG/ML IJ SOLN
1000.0000 ug | Freq: Once | INTRAMUSCULAR | Status: AC
Start: 2023-08-20 — End: 2023-08-20
  Administered 2023-08-20: 1000 ug via INTRAMUSCULAR

## 2023-08-20 MED ORDER — DOXYCYCLINE HYCLATE 100 MG PO TABS: 100.0000 mg | ORAL_TABLET | Freq: Two times a day (BID) | ORAL | 0 refills | Status: DC

## 2023-08-20 NOTE — Assessment & Plan Note (Signed)
Injection given today.  Tolerated the procedure well.  Will see how patient responds.

## 2023-08-20 NOTE — Assessment & Plan Note (Signed)
Concerned that some of the headache may be more sinus related.  Doxycycline given.  Warned of potential side effects.  Patient has had difficulty with some of the medications previously and when she came off of the Keflex felt like that caused her to have sepsis she states.  Will stay on the Keflex and the doxycycline then just for short course of time.  Follow-up with me or another medical professional if worsening symptoms

## 2023-08-20 NOTE — Assessment & Plan Note (Signed)
Patient has had calcific changes before and does state that she thinks she has 1 in the hand.  Seems to be more of a trigger nodule but we will monitor.

## 2023-08-20 NOTE — Patient Instructions (Addendum)
B12 injection today Doxy 100mg  2x a day for 7 days See me again in 5-6 weeks

## 2023-08-20 NOTE — Assessment & Plan Note (Signed)
I believe the patient is having more of a flare at the moment.  Not quite acting like herself.  Has had difficulty with B12 previously.  Discussed icing regimen and home exercises, discussed which activities to which ones to avoid.  Increase activity slowly otherwise.  Follow-up with me again in 6 to 8 weeks

## 2023-08-20 NOTE — Assessment & Plan Note (Signed)
Not a new problem.  Discussed with patient that with worsening symptoms seek medical attention.  Follow-up again with me in 6 to 8 weeks

## 2023-08-23 ENCOUNTER — Encounter: Payer: Self-pay | Admitting: Family Medicine

## 2023-08-25 ENCOUNTER — Ambulatory Visit (INDEPENDENT_AMBULATORY_CARE_PROVIDER_SITE_OTHER): Payer: Medicare Other

## 2023-08-25 VITALS — BP 153/65 | HR 75 | Temp 97.8°F | Resp 20 | Ht 66.6 in | Wt 135.8 lb

## 2023-08-25 DIAGNOSIS — I2510 Atherosclerotic heart disease of native coronary artery without angina pectoris: Secondary | ICD-10-CM

## 2023-08-25 DIAGNOSIS — E785 Hyperlipidemia, unspecified: Secondary | ICD-10-CM

## 2023-08-25 DIAGNOSIS — E1169 Type 2 diabetes mellitus with other specified complication: Secondary | ICD-10-CM

## 2023-08-25 MED ORDER — INCLISIRAN SODIUM 284 MG/1.5ML ~~LOC~~ SOSY
284.0000 mg | PREFILLED_SYRINGE | Freq: Once | SUBCUTANEOUS | Status: AC
  Administered 2023-08-25: 284 mg via SUBCUTANEOUS
  Filled 2023-08-25: qty 1.5

## 2023-08-25 NOTE — Patient Instructions (Signed)
 Inclisiran Injection What is this medication? INCLISIRAN (in kli SIR an) treats high cholesterol. It works by decreasing bad cholesterol (such as LDL) in your blood. Changes to diet and exercise are often combined with this medication. This medicine may be used for other purposes; ask your health care provider or pharmacist if you have questions. COMMON BRAND NAME(S): LEQVIO What should I tell my care team before I take this medication? They need to know if you have any of these conditions: An unusual or allergic reaction to inclisiran, other medications, foods, dyes, or preservatives Pregnant or trying to get pregnant Breast-feeding How should I use this medication? This medication is injected under the skin. It is given by your care team in a hospital or clinic setting. Talk to your care team about the use of this medication in children. Special care may be needed. Overdosage: If you think you have taken too much of this medicine contact a poison control center or emergency room at once. NOTE: This medicine is only for you. Do not share this medicine with others. What if I miss a dose? Keep appointments for follow-up doses. It is important not to miss your dose. Call your care team if you are unable to keep an appointment. What may interact with this medication? Interactions are not expected. This list may not describe all possible interactions. Give your health care provider a list of all the medicines, herbs, non-prescription drugs, or dietary supplements you use. Also tell them if you smoke, drink alcohol, or use illegal drugs. Some items may interact with your medicine. What should I watch for while using this medication? Visit your care team for regular checks on your progress. Tell your care team if your symptoms do not start to get better or if they get worse. You may need blood work while you are taking this medication. What side effects may I notice from receiving this  medication? Side effects that you should report to your care team as soon as possible: Allergic reactions--skin rash, itching, hives, swelling of the face, lips, tongue, or throat Side effects that usually do not require medical attention (report these to your care team if they continue or are bothersome): Joint pain Pain, redness, or irritation at injection site This list may not describe all possible side effects. Call your doctor for medical advice about side effects. You may report side effects to FDA at 1-800-FDA-1088. Where should I keep my medication? This medication is given in a hospital or clinic. It will not be stored at home. NOTE: This sheet is a summary. It may not cover all possible information. If you have questions about this medicine, talk to your doctor, pharmacist, or health care provider.  2024 Elsevier/Gold Standard (2022-06-26 00:00:00)

## 2023-08-25 NOTE — Progress Notes (Signed)
Diagnosis: Hyperlipidemia  Provider:  Chilton Greathouse MD  Procedure: Injection  Leqvio (inclisiran), Dose: 284 mg, Site: subcutaneous, Number of injections: 1  Post Care: Observation period completed  Discharge: Condition: Good, Destination: Home . AVS Provided  Performed by:  Nat Math, RN

## 2023-09-02 ENCOUNTER — Ambulatory Visit (INDEPENDENT_AMBULATORY_CARE_PROVIDER_SITE_OTHER): Payer: Medicare Other

## 2023-09-02 VITALS — Wt 135.0 lb

## 2023-09-02 DIAGNOSIS — Z0000 Encounter for general adult medical examination without abnormal findings: Secondary | ICD-10-CM | POA: Diagnosis not present

## 2023-09-02 NOTE — Progress Notes (Signed)
Subjective:   Christine [name] is a 72 y.o. female who presents for Medicare Annual (Subsequent) preventive examination.  Visit Complete: Virtual  I connected with  Christine [name] on 09/02/23 by a audio enabled telemedicine application and verified that I am speaking with the correct person using two identifiers.  Patient Location: Home  Provider Location: Office/Clinic  I discussed the limitations of evaluation and management by telemedicine. The patient expressed understanding and agreed to proceed.  Patient Medicare AWV questionnaire was completed by the patient on 08/28/23; I have confirmed that all information answered by patient is correct and no changes since this date.  Vital Signs: Unable to obtain new vitals due to this being a telehealth visit.   Cardiac Risk Factors include: advanced age (>49men, >53 women);diabetes mellitus;dyslipidemia;hypertension     Objective:    Today's Vitals   09/02/23 1352  Weight: 135 lb (61.2 kg)   Body mass index is 21.4 kg/m.     09/02/2023    1:58 PM 04/11/2023   11:24 AM 11/11/2022   10:20 AM 10/27/2022    2:55 PM 08/25/2022   10:40 AM 08/08/2021    8:00 PM 08/06/2021    5:35 PM  Advanced Directives  Does Patient Have a Medical Advance Directive? Yes Yes Yes No Yes Yes Yes  Type of Estate agent of [place];Living will    Healthcare Power of [place];Living will Living will   Does patient want to make changes to medical advance directive?   No - Patient declined   No - Patient declined   Copy of Healthcare Power of Attorney in Chart? No - copy requested    No - copy requested      Current Medications (verified) Outpatient Encounter Medications as of 09/02/2023  Medication Sig   acetaminophen (TYLENOL) 500 MG tablet Take 500 mg by mouth as needed.   aspirin 81 MG tablet Take 81 mg by mouth daily.   atorvastatin (LIPITOR) 40 MG tablet TAKE 1 TABLET BY MOUTH EVERY DAY   cephALEXin (KEFLEX) 250 MG capsule  Take 250 mg by mouth daily. UTI   DULoxetine (CYMBALTA) 60 MG capsule Take 60 mg by mouth daily.   glucose blood (ONETOUCH ULTRA) test strip 1 each by Other route daily at 12 noon. Use as instructed   Lancets (ONETOUCH ULTRASOFT) lancets Check blood sugar once daily. Dx code: 250.00   LORazepam (ATIVAN) 0.5 MG tablet Take 0.5 mg by mouth as needed for anxiety.   metoprolol succinate (TOPROL-XL) 25 MG 24 hr tablet TAKE 1/2 TABLET BY MOUTH DAILY   omeprazole (PRILOSEC) 20 MG capsule TAKE 1 CAPSULE BY MOUTH EVERY DAY   Semaglutide, 2 MG/DOSE, (OZEMPIC, 2 MG/DOSE,) 8 MG/3ML SOPN Inject 2 mg into the skin once a week. Gets from pt assist   tiZANidine (ZANAFLEX) 4 MG tablet TAKE 1 TABLET BY MOUTH AT BEDTIME.   traZODone (DESYREL) 50 MG tablet TAKE 1/2 TO 1 TABLET BY MOUTH AT BEDTIME AS NEEDED FOR SLEEP   vitamin B-12 (CYANOCOBALAMIN) 1000 MCG tablet Take 1,000 mcg by mouth daily.   Vitamin D, Ergocalciferol, (DRISDOL) 1.25 MG (50000 UNIT) CAPS capsule TAKE 1 CAPSULE BY MOUTH ONE TIME PER WEEK   [DISCONTINUED] doxycycline (VIBRA-TABS) 100 MG tablet Take 1 tablet (100 mg total) by mouth 2 (two) times daily.   No facility-administered encounter medications on file as of 09/02/2023.    Allergies (verified) Vioxx [rofecoxib], Prednisone, Macrodantin [nitrofurantoin macrocrystal], Sulfa antibiotics, Zetia [ezetimibe], and Colesevelam   History: Past  Medical History:  Diagnosis Date   Acute bursitis of right shoulder 08/29/2018   Right shoulder injected in August 29, 2018   Allergic rhinitis    Closed displaced fracture of fifth metatarsal bone of left foot 03/16/2017   DEPRESSIVE DISORDER NOT ELSEWHERE CLASSIFIED 03/27/2010   Qualifier: Diagnosis of  By: Alwyn Ren MD, Chrissie Noa   Mr Skeet Latch, NP , [facility] [place]     Diabetes (HCC) 04/02/2016   Diabetes mellitus    Essential hypertension 03/14/2008   Qualifier: Diagnosis of  By: Alwyn Ren MD, [name]     Fibromyalgia 08/12/2011    Diagnosed by Dr Jimmy Footman , Rheumatologist 2010    GERD (gastroesophageal reflux disease) 12/25/2016   Gluteal tendonitis of right buttock 10/19/2017   Injected in October 19, 2017 Injected October 07, 2018   Greater trochanteric bursitis of left hip 06/17/2016   Greater trochanteric bursitis of right hip 11/27/2015   Injected 11/27/2015    History of recurrent UTIs    [name]   HTN (hypertension)    Hx of osteopenia 10/11/2012   [facility] DEXA; ordered by Dr Alwyn Ren Lowest T score: -2.1 @ lumbar spine @ [facility] 11/01/12; decrease of 8.5% vs 05/2009. There is 9.2% risk over 10 yrs History fracture left elbow @ 4 Fractured left wrist , right elbow and nose post fall July/18/2013. Dr. Melvyn Novas No FH Osteoporosis No PMH of bisphosphonate therapy (generic Fosamax Rxed but not filled  due to polypharmacy )    Hyperlipidemia    Hyperlipidemia associated with type 2 diabetes mellitus (HCC) 01/18/2007   Qualifier: Diagnosis of  By: Daine Gip  With DM LDL goal = < 100, ideally < 70. Mi in father @ 16; 2 brothers @ 52 & 58     Lumbar radiculopathy 04/11/2008   Qualifier: Diagnosis of  By: Alwyn Ren MD, Christiane Ha well to epidural 01/12/2017  repeat epidural given May 2018   Migraine headache    quiescent   Nonallopathic lesion of cervical region 07/19/2014   Nonallopathic lesion of lumbosacral region 01/15/2016   Nonallopathic lesion of sacral region 06/27/2018   Nonallopathic lesion-rib cage 09/26/2014   Palpitations 04/15/2011   Rotator cuff impingement syndrome of left shoulder 05/11/2014   Seasonal allergic rhinitis 03/21/2012   Sinusitis, chronic 04/20/2016   Sleep disorder    Dr Dohmier   Subluxation of peroneal tendon of right foot 11/10/2017   TIA (transient ischemic attack)    TRANSIENT ISCHEMIC ATTACKS, HX OF 02/02/2008   Qualifier: Diagnosis of  By: Magda Bernheim ADVRS EFF UNS RX MEDICINAL&BIOLOGICAL SBSTNC 02/02/2008   Qualifier: Diagnosis of  By: Alwyn Ren MD, Myer Peer AND MYOSITIS 02/02/2008   Qualifier: Diagnosis of  By: Alwyn Ren MD, [name]     UTI (urinary tract infection) 10/05/2014   Viral upper respiratory tract infection with cough 12/31/2014   Vitamin D deficiency 05/25/2008   Qualifier: Diagnosis of  By: Alwyn Ren MD, Chrissie Noa     Past Surgical History:  Procedure Laterality Date   COLONOSCOPY     Dr Leone Payor; hemorrhoids   CORONARY ANGIOPLASTY  12/15/1995   for chest pain- negative    FOOT MASS EXCISION Left 04/08/2023   KNEE ARTHROSCOPY Left 2023   POLYPECTOMY  12/14/2000   benign, hyperplastic polyp; rectal bleeding 2008   TONSILLECTOMY     TOTAL ABDOMINAL HYSTERECTOMY  12/14/1981   BSO for endometriosis   WISDOM TOOTH EXTRACTION  Family History  Problem Relation Age of Onset   Colon cancer Maternal Aunt    Diabetes Paternal Uncle    Heart attack Father 51   COPD Mother    Lung cancer Brother        smoker   Mental illness Other        niece, committed suicide.   Heart attack Other        brother X 2; @ 50 & 52   Stroke Neg Hx    Social History   Socioeconomic History   Marital status: Married    Spouse name: Not on file   Number of children: 2   Years of education: Not on file   Highest education level: Not on file  Occupational History   Occupation: Secretary/administrator: [employer]  Tobacco Use   Smoking status: Never   Smokeless tobacco: Never  Vaping Use   Vaping status: Never Used  Substance and Sexual Activity   Alcohol use: No   Drug use: No   Sexual activity: Not on file  Other Topics Concern   Not on file  Social History Narrative   Regular exercise- no    Son dec 2011.  1 grand   Social Determinants of Health   Financial Resource Strain: Low Risk  (08/28/2023)   Overall Financial Resource Strain (CARDIA)    Difficulty of Paying Living Expenses: Not hard at all  Food Insecurity: No Food Insecurity (08/28/2023)   Hunger Vital Sign    Worried About Running  Out of Food in the Last Year: Never true    Ran Out of Food in the Last Year: Never true  Transportation Needs: No Transportation Needs (08/28/2023)   PRAPARE - Administrator, Civil Service (Medical): No    Lack of Transportation (Non-Medical): No  Physical Activity: Inactive (08/28/2023)   Exercise Vital Sign    Days of Exercise per Week: 0 days    Minutes of Exercise per Session: 0 min  Stress: No Stress Concern Present (08/28/2023)   [employer] of Occupational Health - Occupational Stress Questionnaire    Feeling of Stress : Only a little  Social Connections: Moderately Integrated (08/28/2023)   Social Connection and Isolation Panel [NHANES]    Frequency of Communication with Friends and Family: Twice a week    Frequency of Social Gatherings with Friends and Family: Once a week    Attends Religious Services: More than 4 times per year    Active Member of [employer] or Organizations: No    Attends Engineer, structural: Never    Marital Status: Married    Tobacco Counseling Counseling given: Not Answered   Clinical Intake:  Pre-visit preparation completed: Yes  Pain : No/denies pain     BMI - recorded: 21.4 Nutritional Status: BMI of 19-24  Normal Nutritional Risks: None Diabetes: Yes CBG done?: Yes (106 per pt) CBG resulted in Enter/ Edit results?: No Did pt. bring in CBG monitor from home?: No  How often do you need to have someone help you when you read instructions, pamphlets, or other written materials from your doctor or pharmacy?: 1 - Never  Interpreter Needed?: No  Information entered by :: Lanier Ensign, LPN   Activities of Daily Living    08/28/2023    8:56 AM  In your present state of health, do you have any difficulty performing the following activities:  Hearing? 0  Vision? 0  Difficulty concentrating or  making decisions? 0  Walking or climbing stairs? 0  Dressing or bathing? 0  Doing errands, shopping? 0  Preparing Food  and eating ? N  Using the Toilet? N  In the past six months, have you accidently leaked urine? Y  Comment pt wears liner  Do you have problems with loss of bowel control? N  Managing your Medications? N  Managing your Finances? N  Housekeeping or managing your Housekeeping? N    Patient Care Team: Jeani Sow, MD as PCP - General (Family Medicine) Pricilla Riffle, MD as PCP - Cardiology (Cardiology) Drema Dallas, DO as Consulting Physician (Neurology) Szabat, Vinnie Level, [facility] (Inactive) (Pharmacist)  Indicate any recent Medical Services you may have received from other than [facility] providers in the past year (date may be approximate).     Assessment:   This is a routine wellness examination for Christine [name].  Hearing/Vision screen Hearing Screening - Comments:: Pt denies any hearing issues  Vision Screening - Comments:: [facility]    Goals Addressed             This Visit's Progress    Patient Stated       Get LDL under control        Depression Screen    09/02/2023    1:56 PM 07/01/2023   11:02 AM 04/01/2023   11:22 AM 08/25/2022   10:38 AM 02/09/2022    1:41 PM 06/04/2021    4:25 PM 04/21/2021    1:12 PM  PHQ 2/9 Scores  PHQ - 2 Score 1 0 1 0 0 0 0  PHQ- 9 Score  3 3        Fall Risk    08/28/2023    8:56 AM 07/01/2023   11:01 AM 07/01/2023   11:00 AM 04/01/2023   11:22 AM 12/31/2022   10:57 AM  Fall Risk   Falls in the past year? 1 1 0 1 1  Number falls in past yr: 0 0 0 0 1  Injury with Fall? 1 1 0 1 1  Risk for fall due to : Impaired vision Impaired balance/gait No Fall Risks Impaired balance/gait Impaired balance/gait  Follow up Falls prevention discussed Falls prevention discussed Falls evaluation completed Falls prevention discussed Education provided;Falls prevention discussed    MEDICARE RISK AT HOME: Medicare Risk at Home Any stairs in or around the home?: No If so, are there any without handrails?: No Home free of loose throw rugs in  walkways, pet beds, electrical cords, etc?: Yes Adequate lighting in your home to reduce risk of falls?: Yes Life alert?: No Use of a cane, walker or w/c?: No Grab bars in the bathroom?: Yes Shower chair or bench in shower?: No Elevated toilet seat or a handicapped toilet?: Yes  TIMED UP AND GO:  Was the test performed?  No    Cognitive Function:        09/02/2023    1:59 PM 08/25/2022   10:43 AM  6CIT Screen  What Year? 0 points 0 points  What month? 0 points 0 points  What time? 0 points 0 points  Count back from 20 0 points 0 points  Months in reverse 0 points 0 points  Repeat phrase 0 points 2 points  Total Score 0 points 2 points    Immunizations Immunization History  Administered Date(s) Administered   Fluad Quad(high Dose 65+) 08/11/2019, 11/27/2020, 09/30/2022   Influenza Split 10/13/2011, 10/11/2012   Influenza  Whole 11/17/2007, 09/26/2010   Influenza, High Dose Seasonal PF 09/10/2016, 09/30/2017, 09/08/2018   Influenza,inj,Quad PF,6+ Mos 11/07/2013, 08/29/2014, 09/13/2015   PFIZER(Purple Top)SARS-COV-2 Vaccination 02/11/2020, 03/12/2020, 02/06/2021   Pneumococcal Conjugate-13 12/24/2016   Pneumococcal Polysaccharide-23 02/08/2019   Td 06/07/2009    TDAP status: Due, Education has been provided regarding the importance of this vaccine. Advised may receive this vaccine at local pharmacy or Health Dept. Aware to provide a copy of the vaccination record if obtained from local pharmacy or Health Dept. Verbalized acceptance and understanding.  Flu Vaccine status: Due, Education has been provided regarding the importance of this vaccine. Advised may receive this vaccine at local pharmacy or Health Dept. Aware to provide a copy of the vaccination record if obtained from local pharmacy or Health Dept. Verbalized acceptance and understanding.  Pneumococcal vaccine status: Up to date  Covid-19 vaccine status: Information provided on how to obtain vaccines.   Qualifies  for Shingles Vaccine? Yes   Zostavax completed No   Shingrix Completed?: No.    Education has been provided regarding the importance of this vaccine. Patient has been advised to call insurance company to determine out of pocket expense if they have not yet received this vaccine. Advised may also receive vaccine at local pharmacy or Health Dept. Verbalized acceptance and understanding.  Screening Tests Health Maintenance  Topic Date Due   Zoster Vaccines- Shingrix (1 of 2) Never done   DTaP/Tdap/Td (2 - Tdap) 06/08/2019   OPHTHALMOLOGY EXAM  07/13/2023   COVID-19 Vaccine (4 - 2023-24 season) 08/15/2023   Diabetic kidney evaluation - Urine ACR  10/01/2023   INFLUENZA VACCINE  03/13/2024 (Originally 07/15/2023)   FOOT EXAM  01/01/2024   HEMOGLOBIN A1C  01/01/2024   Diabetic kidney evaluation - eGFR measurement  06/30/2024   Medicare Annual Wellness (AWV)  09/01/2024   MAMMOGRAM  09/17/2024   Pneumonia Vaccine 45+ Years old  Completed   DEXA SCAN  Completed   Hepatitis C Screening  Completed   HPV VACCINES  Aged Out   Colonoscopy  Discontinued    Health Maintenance  Health Maintenance Due  Topic Date Due   Zoster Vaccines- Shingrix (1 of 2) Never done   DTaP/Tdap/Td (2 - Tdap) 06/08/2019   OPHTHALMOLOGY EXAM  07/13/2023   COVID-19 Vaccine (4 - 2023-24 season) 08/15/2023   Diabetic kidney evaluation - Urine ACR  10/01/2023    Colorectal cancer screening: No longer required.   Mammogram status: Completed 09/17/22. Repeat every year  Bone Density status: Completed 01/08/15. Results reflect: Bone density results: OSTEOPENIA. Repeat every 2 years.  Additional Screening:  Hepatitis C Screening:  Completed 11/25/15  Vision Screening: Recommended annual ophthalmology exams for early detection of glaucoma and other disorders of the eye. Is the patient up to date with their annual eye exam?  Yes  Who is the provider or what is the name of the office in which the patient attends annual  eye exams? [facility]  If pt is not established with a provider, would they like to be referred to a provider to establish care? No .   Dental Screening: Recommended annual dental exams for proper oral hygiene  Diabetic Foot Exam: Diabetic Foot Exam: Completed 12/31/22  Community Resource Referral / Chronic Care Management: CRR required this visit?  No   CCM required this visit?  No     Plan:     I have personally reviewed and noted the following in the patient's chart:   Medical and social history Use  of alcohol, tobacco or illicit drugs  Current medications and supplements including opioid prescriptions. Patient is not currently taking opioid prescriptions. Functional ability and status Nutritional status Physical activity Advanced directives List of other physicians Hospitalizations, surgeries, and ER visits in previous 12 months Vitals Screenings to include cognitive, depression, and falls Referrals and appointments  In addition, I have reviewed and discussed with patient certain preventive protocols, quality metrics, and best practice recommendations. A written personalized care plan for preventive services as well as general preventive health recommendations were provided to patient.     Marzella Schlein, LPN   [id number]   After Visit Summary: (MyChart) Due to this being a telephonic visit, the after visit summary with patients personalized plan was offered to patient via MyChart   Nurse Notes: none

## 2023-09-02 NOTE — Patient Instructions (Signed)
Christine [name] , Thank you for taking time to come for your Medicare Wellness Visit. I appreciate your ongoing commitment to your health goals. Please review the following plan we discussed and let me know if I can assist you in the future. Pt will follow up   Referrals/Orders/Follow-Ups/Clinician Recommendations: get LDL under control,   This is a list of the screening recommended for you and due dates:  Health Maintenance  Topic Date Due   Zoster (Shingles) Vaccine (1 of 2) Never done   DTaP/Tdap/Td vaccine (2 - Tdap) 06/08/2019   Eye exam for diabetics  07/13/2023   Flu Shot  07/15/2023   COVID-19 Vaccine (4 - 2023-24 season) 08/15/2023   Yearly kidney health urinalysis for diabetes  10/01/2023   Complete foot exam   01/01/2024   Hemoglobin A1C  01/01/2024   Yearly kidney function blood test for diabetes  06/30/2024   Medicare Annual Wellness Visit  09/01/2024   Mammogram  09/17/2024   Pneumonia Vaccine  Completed   DEXA scan (bone density measurement)  Completed   Hepatitis C Screening  Completed   HPV Vaccine  Aged Out   Colon Cancer Screening  Discontinued    Advanced directives: (Copy Requested) Please bring a copy of your health care power of attorney and living will to the office to be added to your chart at your convenience.  Next Medicare Annual Wellness Visit scheduled for next year: Yes

## 2023-09-09 ENCOUNTER — Other Ambulatory Visit: Payer: Self-pay | Admitting: Family Medicine

## 2023-09-15 ENCOUNTER — Other Ambulatory Visit: Payer: Self-pay | Admitting: Family Medicine

## 2023-09-20 DIAGNOSIS — F339 Major depressive disorder, recurrent, unspecified: Secondary | ICD-10-CM | POA: Diagnosis not present

## 2023-09-20 DIAGNOSIS — F410 Panic disorder [episodic paroxysmal anxiety] without agoraphobia: Secondary | ICD-10-CM | POA: Diagnosis not present

## 2023-09-22 ENCOUNTER — Telehealth: Payer: Self-pay | Admitting: Family Medicine

## 2023-09-22 NOTE — Telephone Encounter (Signed)
Spoke to patient and informed her that I have not seen a shipment for her. Patient stated she will call the 800 number to find out why it's taking so long. Patient aware that I will call her when it come in.

## 2023-09-22 NOTE — Telephone Encounter (Signed)
Patient requests to be called when shipment of Ozempic arrives at office? States Ozempic was shipped to office last week.

## 2023-09-30 ENCOUNTER — Encounter: Payer: Self-pay | Admitting: Family Medicine

## 2023-09-30 ENCOUNTER — Ambulatory Visit: Payer: Medicare Other | Admitting: Family Medicine

## 2023-09-30 VITALS — BP 131/79 | HR 80 | Temp 97.5°F | Resp 16 | Ht 66.0 in | Wt 136.0 lb

## 2023-09-30 DIAGNOSIS — G43009 Migraine without aura, not intractable, without status migrainosus: Secondary | ICD-10-CM

## 2023-09-30 DIAGNOSIS — Z7985 Long-term (current) use of injectable non-insulin antidiabetic drugs: Secondary | ICD-10-CM | POA: Diagnosis not present

## 2023-09-30 DIAGNOSIS — F339 Major depressive disorder, recurrent, unspecified: Secondary | ICD-10-CM

## 2023-09-30 DIAGNOSIS — E119 Type 2 diabetes mellitus without complications: Secondary | ICD-10-CM

## 2023-09-30 DIAGNOSIS — Z23 Encounter for immunization: Secondary | ICD-10-CM | POA: Diagnosis not present

## 2023-09-30 LAB — MICROALBUMIN / CREATININE URINE RATIO
Creatinine,U: 111.1 mg/dL
Microalb Creat Ratio: 9.4 mg/g (ref 0.0–30.0)
Microalb, Ur: 10.4 mg/dL — ABNORMAL HIGH (ref 0.0–1.9)

## 2023-09-30 LAB — COMPREHENSIVE METABOLIC PANEL
ALT: 25 U/L (ref 0–35)
AST: 20 U/L (ref 0–37)
Albumin: 4.2 g/dL (ref 3.5–5.2)
Alkaline Phosphatase: 68 U/L (ref 39–117)
BUN: 17 mg/dL (ref 6–23)
CO2: 26 meq/L (ref 19–32)
Calcium: 9.7 mg/dL (ref 8.4–10.5)
Chloride: 102 meq/L (ref 96–112)
Creatinine, Ser: 0.78 mg/dL (ref 0.40–1.20)
GFR: 74.87 mL/min (ref >60.00)
Glucose, Bld: 133 mg/dL — ABNORMAL HIGH (ref 70–99)
Potassium: 3.9 meq/L (ref 3.5–5.1)
Sodium: 137 meq/L (ref 135–145)
Total Bilirubin: 0.5 mg/dL (ref 0.2–1.2)
Total Protein: 6.8 g/dL (ref 6.0–8.3)

## 2023-09-30 LAB — HEMOGLOBIN A1C: Hgb A1c MFr Bld: 7.1 % — ABNORMAL HIGH (ref 4.6–6.5)

## 2023-09-30 LAB — LIPID PANEL
Cholesterol: 127 mg/dL (ref 0–200)
HDL: 57.6 mg/dL (ref >39.00)
LDL Cholesterol: 49 mg/dL (ref 0–99)
NonHDL: 69.34
Total CHOL/HDL Ratio: 2
Triglycerides: 100 mg/dL (ref 0.0–149.0)
VLDL: 20 mg/dL (ref 0.0–40.0)

## 2023-09-30 MED ORDER — NURTEC 75 MG PO TBDP
75.0000 mg | ORAL_TABLET | Freq: Every day | ORAL | 1 refills | Status: DC | PRN
Start: 2023-09-30 — End: 2023-11-02

## 2023-09-30 MED ORDER — TRAMADOL HCL 50 MG PO TABS
50.0000 mg | ORAL_TABLET | Freq: Three times a day (TID) | ORAL | 0 refills | Status: AC | PRN
Start: 2023-09-30 — End: 2023-10-05

## 2023-09-30 NOTE — Assessment & Plan Note (Signed)
Chronic, but now getting different types of HA and other symptoms since fall 1 yr ago.  Advised migraines can vay and more than one type.  But, as new, some "smells", and tinnitis-will check MRI.  Will do nurtec 75mg  on days of HA(no triptans as CAD).  Also, tramadol 50mg  and zofran if severe and not resolve w/nurtec.

## 2023-09-30 NOTE — Progress Notes (Signed)
[name] [address] [place] [address] Phone: [phone] Subjective:    I'm seeing this patient by the request  of:  Jeani Sow, MD  CC: back and neck pain follow up   [id number]  Christine [name] is a 72 y.o. female coming in with complaint of back and neck pain. OMT 08/20/2023. Patient states she is so so, having some neck tightness.  Patient is scheduled to have a MRI of the brain in 2 weeks.  Medications patient has been prescribed: Vit D  Taking:     Last A1c 7.1    Reviewed prior external information including notes and imaging from previsou exam, outside providers and external EMR if available.   As well as notes that were available from care everywhere and other healthcare systems.  Past medical history, social, surgical and family history all reviewed in electronic medical record.  No pertanent information unless stated regarding to the chief complaint.   Past Medical History:  Diagnosis Date   Acute bursitis of right shoulder 08/29/2018   Right shoulder injected in August 29, 2018   Allergic rhinitis    Closed displaced fracture of fifth metatarsal bone of left foot 03/16/2017   DEPRESSIVE DISORDER NOT ELSEWHERE CLASSIFIED 03/27/2010   Qualifier: Diagnosis of  By: Alwyn Ren MD, Chrissie Noa   Mr Skeet Latch, NP , [facility] [place] [place]     Diabetes (HCC) 04/02/2016   Diabetes mellitus    Essential hypertension 03/14/2008   Qualifier: Diagnosis of  By: Alwyn Ren MD, [name]     Fibromyalgia 08/12/2011   Diagnosed by Dr Jimmy Footman , Rheumatologist 2010    GERD (gastroesophageal reflux disease) 12/25/2016   Gluteal tendonitis of right buttock 10/19/2017   Injected in October 19, 2017 Injected October 07, 2018   Greater trochanteric bursitis of left hip 06/17/2016   Greater trochanteric bursitis of right hip 11/27/2015   Injected 11/27/2015    History of recurrent UTIs    [name]   HTN (hypertension)    Hx of  osteopenia 10/11/2012   [facility] DEXA; ordered by Dr Alwyn Ren Lowest T score: -2.1 @ lumbar spine @ [facility] 11/01/12; decrease of 8.5% vs 05/2009. There is 9.2% risk over 10 yrs History fracture left elbow @ 4 Fractured left wrist , right elbow and nose post fall July/18/2013. Dr. Melvyn Novas No FH Osteoporosis No PMH of bisphosphonate therapy (generic Fosamax Rxed but not filled  due to polypharmacy )    Hyperlipidemia    Hyperlipidemia associated with type 2 diabetes mellitus (HCC) 01/18/2007   Qualifier: Diagnosis of  By: Daine Gip  With DM LDL goal = < 100, ideally < 70. Mi in father @ 64; 2 brothers @ 64 & 21     Lumbar radiculopathy 04/11/2008   Qualifier: Diagnosis of  By: Alwyn Ren MD, Christiane Ha well to epidural 01/12/2017  repeat epidural given May 2018   Migraine headache    quiescent   Nonallopathic lesion of cervical region 07/19/2014   Nonallopathic lesion of lumbosacral region 01/15/2016   Nonallopathic lesion of sacral region 06/27/2018   Nonallopathic lesion-rib cage 09/26/2014   Palpitations 04/15/2011   Rotator cuff impingement syndrome of left shoulder 05/11/2014   Seasonal allergic rhinitis 03/21/2012   Sinusitis, chronic 04/20/2016   Sleep disorder    Dr Dohmier   Subluxation of peroneal tendon of right foot 11/10/2017   TIA (transient ischemic attack)    TRANSIENT ISCHEMIC ATTACKS, HX OF 02/02/2008  Qualifier: Diagnosis of  By: Magda Bernheim ADVRS EFF UNS RX MEDICINAL&BIOLOGICAL SBSTNC 02/02/2008   Qualifier: Diagnosis of  By: Alwyn Ren MD, Myer Peer AND MYOSITIS 02/02/2008   Qualifier: Diagnosis of  By: Alwyn Ren MD, Chrissie Noa     UTI (urinary tract infection) 10/05/2014   Viral upper respiratory tract infection with cough 12/31/2014   Vitamin D deficiency 05/25/2008   Qualifier: Diagnosis of  By: Alwyn Ren MD, Chrissie Noa      Allergies  Allergen Reactions   Vioxx [Rofecoxib] Other (See Comments)    Excess BP which caused TIA    Prednisone Other (See  Comments)    Mental status changes with high-dose oral agent. Tolerates epidural steroid injections. No associated rash or fever   Macrodantin [Nitrofurantoin Macrocrystal] Hives   Sulfa Antibiotics     Hives   Zetia [Ezetimibe]     myalgias   Colesevelam Other (See Comments)    Leg Pain     Review of Systems:  No , visual changes, nausea, vomiting, diarrhea, constipation, dizziness, abdominal pain, skin rash, fevers, chills, night sweats, weight loss, swollen lymph nodes, joint swelling, chest pain, shortness of breath, mood changes. POSITIVE muscle aches, body aches, headache  Objective  Blood pressure 118/80, height 5\' 6"  (1.676 m), weight 138 lb (62.6 kg).   General: No apparent distress alert and oriented x3 mood and affect normal, dressed appropriately.  HEENT: Pupils equal, extraocular movements intact  Respiratory: Patient's speak in full sentences and does not appear short of breath  Cardiovascular: No lower extremity edema, non tender, no erythema  Gait relatively normal MSK:  Back does have loss of lordosis does have lordosis loss as well as the cervical spine.  Some limited sidebending bilaterally.  Osteopathic findings  C2 flexed rotated and side bent right C6 flexed rotated and side bent left T3 extended rotated and side bent right inhaled rib T9 extended rotated and side bent left L2 flexed rotated and side bent right Sacrum right on right       Assessment and Plan:  Cervical disc disorder with radiculopathy of cervical region Known arthritic changes.  No significant dizziness though noted with any type of extension of the neck.  Have worked up before for any type of vascular compromise that we have not seen previously.  Patient's primary is working the MRI for chronic headaches that seem to be increasing in severity and frequency.  We did treat patient previously for more of a sinusitis.  Did not respond to that.  Discussed avoiding certain activities  otherwise.  Follow-up again in 6 to 8 weeks.    Nonallopathic problems  Decision today to treat with OMT was based on Physical Exam  After verbal consent patient was treated with , ME, FPR techniques in cervical, rib, thoracic, lumbar, and sacral  areas avoided HVLA on the neck secondary to patient's recent headaches.  Patient tolerated the procedure well with improvement in symptoms  Patient given exercises, stretches and lifestyle modifications  See medications in patient instructions if given  Patient will follow up in 4-8 weeks     The above documentation has been reviewed and is accurate and complete Judi Saa, DO         Note: This dictation was prepared with Dragon dictation along with smaller phrase technology. Any transcriptional errors that result from this process are unintentional.

## 2023-09-30 NOTE — Assessment & Plan Note (Signed)
Chronic.  Controlled on ozempic 2mg  weekly(gets from pt assistance).  Working on diet/exercise.  She would like to consider lowering her dosage with concerns about her weight. She will continue to inject, switching to 2 mg injections every 10 days. When off, may need to start daily insulin again or Jardiance

## 2023-09-30 NOTE — Assessment & Plan Note (Signed)
Chronic.  Seeing mental health  has flared recently.  Cymbalta dose was adjusted recently

## 2023-09-30 NOTE — Progress Notes (Signed)
Subjective:     Patient ID: Christine [name], female    DOB: 1951-09-11, 72 y.o.   MRN: [id number]  Chief Complaint  Patient presents with   Medical Management of Chronic Issues    3 month follow-up on dm Fasting Having headaches everyday for over 1 month    HPI  DM, type 2 - will get off ozempic as not want to lose more wt and concerned-still has pt assist so will finish the next shipment.  Checks in am-106-120.  Occ 130-140.   depression - seeing MH.  Increased duloxetine 1 wk ago. No SI.  Back in "pits".  Son dec 2011 thought from ETOH.  Just found out "track marks"-pt upset and getting HA daily now as well. Getting forgetful  HA - starts at nose and then to forehead-band in head.  Ears feel "full" and ringing.  +nausea, no vomit. Last 2-3 hrs and can return same day.  Jaws can hurt as well(some better). No dbl vision. Saw ophth July. Shrill sounds can trigger and smells.  This is different than her migraines. Tylenol not help.  Few days ago, took tramadol, promethazine.   HA since fall in Nov.  Pressure frontal and ears.  Chronic dizziness episodes  tinnitis - can be so loud can't hear TV.  If lies down, will improve  smell - can smell things "that aren't there".  Like fresh paint, plastic.  No other symptoms.  Hasn't thought about seeing if correlates to HA    Health Maintenance Due  Topic Date Due   DTaP/Tdap/Td (2 - Tdap) 06/08/2019   OPHTHALMOLOGY EXAM  07/13/2023   Diabetic kidney evaluation - Urine ACR  10/01/2023    Past Medical History:  Diagnosis Date   Acute bursitis of right shoulder 08/29/2018   Right shoulder injected in August 29, 2018   Allergic rhinitis    Closed displaced fracture of fifth metatarsal bone of left foot 03/16/2017   DEPRESSIVE DISORDER NOT ELSEWHERE CLASSIFIED 03/27/2010   Qualifier: Diagnosis of  By: Alwyn Ren MD, Chrissie Noa   Mr Skeet Latch, NP , [facility] [place] [place]     Diabetes (HCC) 04/02/2016   Diabetes mellitus     Essential hypertension 03/14/2008   Qualifier: Diagnosis of  By: Alwyn Ren MD, [name]     Fibromyalgia 08/12/2011   Diagnosed by Dr Jimmy Footman , Rheumatologist 2010    GERD (gastroesophageal reflux disease) 12/25/2016   Gluteal tendonitis of right buttock 10/19/2017   Injected in October 19, 2017 Injected October 07, 2018   Greater trochanteric bursitis of left hip 06/17/2016   Greater trochanteric bursitis of right hip 11/27/2015   Injected 11/27/2015    History of recurrent UTIs    [name]   HTN (hypertension)    Hx of osteopenia 10/11/2012   [facility] DEXA; ordered by Dr Alwyn Ren Lowest T score: -2.1 @ lumbar spine @ [facility] 11/01/12; decrease of 8.5% vs 05/2009. There is 9.2% risk over 10 yrs History fracture left elbow @ 4 Fractured left wrist , right elbow and nose post fall July/18/2013. Dr. Melvyn Novas No FH Osteoporosis No PMH of bisphosphonate therapy (generic Fosamax Rxed but not filled  due to polypharmacy )    Hyperlipidemia    Hyperlipidemia associated with type 2 diabetes mellitus (HCC) 01/18/2007   Qualifier: Diagnosis of  By: Daine Gip  With DM LDL goal = < 100, ideally < 70. Mi in father @ 60; 2 brothers @ 84 & 76     Lumbar  radiculopathy 04/11/2008   Qualifier: Diagnosis of  By: Alwyn Ren MD, Christiane Ha well to epidural 01/12/2017  repeat epidural given May 2018   Migraine headache    quiescent   Nonallopathic lesion of cervical region 07/19/2014   Nonallopathic lesion of lumbosacral region 01/15/2016   Nonallopathic lesion of sacral region 06/27/2018   Nonallopathic lesion-rib cage 09/26/2014   Palpitations 04/15/2011   Rotator cuff impingement syndrome of left shoulder 05/11/2014   Seasonal allergic rhinitis 03/21/2012   Sinusitis, chronic 04/20/2016   Sleep disorder    Dr Dohmier   Subluxation of peroneal tendon of right foot 11/10/2017   TIA (transient ischemic attack)    TRANSIENT ISCHEMIC ATTACKS, HX OF 02/02/2008   Qualifier: Diagnosis of  By: Magda Bernheim ADVRS  EFF UNS RX MEDICINAL&BIOLOGICAL SBSTNC 02/02/2008   Qualifier: Diagnosis of  By: Alwyn Ren MD, Myer Peer AND MYOSITIS 02/02/2008   Qualifier: Diagnosis of  By: Alwyn Ren MD, [name]     UTI (urinary tract infection) 10/05/2014   Viral upper respiratory tract infection with cough 12/31/2014   Vitamin D deficiency 05/25/2008   Qualifier: Diagnosis of  By: Alwyn Ren MD, Chrissie Noa      Past Surgical History:  Procedure Laterality Date   COLONOSCOPY     Dr Leone Payor; hemorrhoids   CORONARY ANGIOPLASTY  12/15/1995   for chest pain- negative    FOOT MASS EXCISION Left 04/08/2023   KNEE ARTHROSCOPY Left 2023   POLYPECTOMY  12/14/2000   benign, hyperplastic polyp; rectal bleeding 2008   TONSILLECTOMY     TOTAL ABDOMINAL HYSTERECTOMY  12/14/1981   BSO for endometriosis   WISDOM TOOTH EXTRACTION       Current Outpatient Medications:    acetaminophen (TYLENOL) 500 MG tablet, Take 500 mg by mouth as needed., Disp: , Rfl:    aspirin 81 MG tablet, Take 81 mg by mouth daily., Disp: , Rfl:    atorvastatin (LIPITOR) 40 MG tablet, TAKE 1 TABLET BY MOUTH EVERY DAY, Disp: 90 tablet, Rfl: 3   cephALEXin (KEFLEX) 250 MG capsule, TAKE 1 CAPSULE BY MOUTH DAILY., Disp: 90 capsule, Rfl: 1   DULoxetine (CYMBALTA) 60 MG capsule, Take 60 mg by mouth daily. Taking 90 mg daily, Disp: , Rfl:    glucose blood (ONETOUCH ULTRA) test strip, 1 each by Other route daily at 12 noon. Use as instructed, Disp: 100 strip, Rfl: 3   Inclisiran Sodium (LEQVIO [place]), Inject into the skin., Disp: , Rfl:    Lancets (ONETOUCH ULTRASOFT) lancets, Check blood sugar once daily. Dx code: 250.00, Disp: 100 each, Rfl: 12   LORazepam (ATIVAN) 0.5 MG tablet, Take 0.5 mg by mouth as needed for anxiety., Disp: , Rfl:    metoprolol succinate (TOPROL-XL) 25 MG 24 hr tablet, TAKE 1/2 TABLET BY MOUTH DAILY, Disp: 45 tablet, Rfl: 1   omeprazole (PRILOSEC) 20 MG capsule, TAKE 1 CAPSULE BY MOUTH EVERY DAY, Disp: 90 capsule, Rfl: 3    Rimegepant Sulfate (NURTEC) 75 MG TBDP, Take 1 tablet (75 mg total) by mouth daily as needed., Disp: 16 tablet, Rfl: 1   Semaglutide, 2 MG/DOSE, (OZEMPIC, 2 MG/DOSE,) 8 MG/3ML SOPN, Inject 2 mg into the skin once a week. Gets from pt assist, Disp: 9 mL, Rfl: 3   tiZANidine (ZANAFLEX) 4 MG tablet, TAKE 1 TABLET BY MOUTH AT BEDTIME., Disp: 90 tablet, Rfl: 1   traMADol (ULTRAM) 50 MG tablet, Take 1 tablet (50 mg total) by mouth every  8 (eight) hours as needed for up to 5 days., Disp: 10 tablet, Rfl: 0   traZODone (DESYREL) 50 MG tablet, TAKE 1/2 TO 1 TABLET BY MOUTH AT BEDTIME AS NEEDED FOR SLEEP, Disp: 90 tablet, Rfl: 1   vitamin B-12 (CYANOCOBALAMIN) 1000 MCG tablet, Take 1,000 mcg by mouth daily., Disp: , Rfl:    Vitamin D, Ergocalciferol, (DRISDOL) 1.25 MG (50000 UNIT) CAPS capsule, TAKE 1 CAPSULE BY MOUTH ONE TIME PER WEEK, Disp: 12 capsule, Rfl: 0  Allergies  Allergen Reactions   Vioxx [Rofecoxib] Other (See Comments)    Excess BP which caused TIA    Prednisone Other (See Comments)    Mental status changes with high-dose oral agent. Tolerates epidural steroid injections. No associated rash or fever   Macrodantin [Nitrofurantoin Macrocrystal] Hives   Sulfa Antibiotics     Hives   Zetia [Ezetimibe]     myalgias   Colesevelam Other (See Comments)    Leg Pain   ROS neg/noncontributory except as noted HPI/below      Objective:     BP 131/79   Pulse 80   Temp (!) 97.5 F (36.4 C) (Temporal)   Resp 16   Ht 5\' 6"  (1.676 m)   Wt 136 lb (61.7 kg)   LMP  (LMP Unknown)   SpO2 99%   BMI 21.95 kg/m  Wt Readings from Last 3 Encounters:  09/30/23 136 lb (61.7 kg)  09/02/23 135 lb (61.2 kg)  08/25/23 135 lb 12.8 oz (61.6 kg)    Physical Exam   Gen: WDWN NAD HEENT: NCAT, conjunctiva not injected, sclera nonicteric NECK:  supple, no thyromegaly, no nodes, no carotid bruits CARDIAC: RRR, S1S2+, no murmur. DP 2+B LUNGS: CTAB. No wheezes ABDOMEN:  BS+, soft, NTND, No HSM, no  masses EXT:  no edema MSK: no gross abnormalities.  NEURO: A&O x3.  CN II-XII intact.  PSYCH: normal mood. Good eye contact     Assessment & Plan:  Diabetes mellitus type 2 in nonobese [facility]) Assessment & Plan: Chronic.  Controlled on ozempic 2mg  weekly(gets from pt assistance).  Working on diet/exercise.  She would like to consider lowering her dosage with concerns about her weight. She will continue to inject, switching to 2 mg injections every 10 days. When off, may need to start daily insulin again or Jardiance  Orders: -     Comprehensive metabolic panel -     Hemoglobin A1c -     Lipid panel -     Microalbumin / creatinine urine ratio  Migraine without aura and without status migrainosus, not intractable Assessment & Plan: Chronic, but now getting different types of HA and other symptoms since fall 1 yr ago.  Advised migraines can vay and more than one type.  But, as new, some "smells", and tinnitis-will check MRI.  Will do nurtec 75mg  on days of HA(no triptans as CAD).  Also, tramadol 50mg  and zofran if severe and not resolve w/nurtec.   Orders: -     traMADol HCl; Take 1 tablet (50 mg total) by mouth every 8 (eight) hours as needed for up to 5 days.  Dispense: 10 tablet; Refill: 0 -     Nurtec; Take 1 tablet (75 mg total) by mouth daily as needed.  Dispense: 16 tablet; Refill: 1 -     MR BRAIN WO CONTRAST; Future  Depression, recurrent (HCC) Assessment & Plan: Chronic.  Seeing mental health  has flared recently.  Cymbalta dose was adjusted recently   Need for  influenza vaccination -     Flu Vaccine Trivalent High Dose (Fluad)  Long-term current use of injectable noninsulin antidiabetic medication  MRI  Return in about 3 months (around 12/31/2023) for chronic follow-up.    I, Isabelle Course, acting as a scribe for Angelena Sole, MD., have documented all relevant documentation on the behalf of Angelena Sole, MD, as directed by  Angelena Sole, MD while in the presence of Angelena Sole, MD.  I, Angelena Sole, MD, have reviewed all documentation for this visit. The documentation on 09/30/23 for the exam, diagnosis, procedures, and orders are all accurate and complete.    Angelena Sole, MD

## 2023-09-30 NOTE — Patient Instructions (Signed)

## 2023-09-30 NOTE — Progress Notes (Signed)
Labs look great.  Keep on track

## 2023-10-01 ENCOUNTER — Ambulatory Visit (INDEPENDENT_AMBULATORY_CARE_PROVIDER_SITE_OTHER): Payer: Medicare Other | Admitting: Family Medicine

## 2023-10-01 VITALS — BP 118/80 | Ht 66.0 in | Wt 138.0 lb

## 2023-10-01 DIAGNOSIS — M5010 Cervical disc disorder with radiculopathy, unspecified cervical region: Secondary | ICD-10-CM

## 2023-10-01 DIAGNOSIS — M9908 Segmental and somatic dysfunction of rib cage: Secondary | ICD-10-CM | POA: Diagnosis not present

## 2023-10-01 DIAGNOSIS — M9901 Segmental and somatic dysfunction of cervical region: Secondary | ICD-10-CM | POA: Diagnosis not present

## 2023-10-01 DIAGNOSIS — M9902 Segmental and somatic dysfunction of thoracic region: Secondary | ICD-10-CM

## 2023-10-01 DIAGNOSIS — M9904 Segmental and somatic dysfunction of sacral region: Secondary | ICD-10-CM

## 2023-10-01 DIAGNOSIS — M9903 Segmental and somatic dysfunction of lumbar region: Secondary | ICD-10-CM | POA: Diagnosis not present

## 2023-10-01 NOTE — Patient Instructions (Signed)
Good to see you keep doing exercises I agree with the MRI  6-8 week follow up

## 2023-10-01 NOTE — Assessment & Plan Note (Signed)
Known arthritic changes.  No significant dizziness though noted with any type of extension of the neck.  Have worked up before for any type of vascular compromise that we have not seen previously.  Patient's primary is working the MRI for chronic headaches that seem to be increasing in severity and frequency.  We did treat patient previously for more of a sinusitis.  Did not respond to that.  Discussed avoiding certain activities otherwise.  Follow-up again in 6 to 8 weeks.

## 2023-10-04 ENCOUNTER — Encounter: Payer: Self-pay | Admitting: Family Medicine

## 2023-10-04 ENCOUNTER — Other Ambulatory Visit: Payer: Self-pay | Admitting: *Deleted

## 2023-10-04 MED ORDER — OZEMPIC (2 MG/DOSE) 8 MG/3ML ~~LOC~~ SOPN: 2.0000 mg | PEN_INJECTOR | SUBCUTANEOUS | 0 refills | Status: DC

## 2023-10-12 ENCOUNTER — Ambulatory Visit
Admission: RE | Admit: 2023-10-12 | Discharge: 2023-10-12 | Disposition: A | Discharge status: Home / Self Care | Payer: Medicare Other | Source: Ambulatory Visit | Attending: Family Medicine | Admitting: Family Medicine

## 2023-10-12 DIAGNOSIS — G43009 Migraine without aura, not intractable, without status migrainosus: Secondary | ICD-10-CM

## 2023-10-15 ENCOUNTER — Encounter: Payer: Self-pay | Admitting: Internal Medicine

## 2023-10-15 ENCOUNTER — Other Ambulatory Visit (HOSPITAL_COMMUNITY): Payer: Self-pay

## 2023-11-02 ENCOUNTER — Encounter: Payer: Self-pay | Admitting: Family Medicine

## 2023-11-02 ENCOUNTER — Ambulatory Visit (INDEPENDENT_AMBULATORY_CARE_PROVIDER_SITE_OTHER): Payer: Medicare Other | Admitting: Family Medicine

## 2023-11-02 VITALS — BP 134/72 | HR 81 | Temp 97.7°F | Resp 16 | Ht 66.0 in | Wt 138.5 lb

## 2023-11-02 DIAGNOSIS — G43009 Migraine without aura, not intractable, without status migrainosus: Secondary | ICD-10-CM | POA: Diagnosis not present

## 2023-11-02 DIAGNOSIS — Z7985 Long-term (current) use of injectable non-insulin antidiabetic drugs: Secondary | ICD-10-CM | POA: Diagnosis not present

## 2023-11-02 DIAGNOSIS — R42 Dizziness and giddiness: Secondary | ICD-10-CM | POA: Diagnosis not present

## 2023-11-02 DIAGNOSIS — E2839 Other primary ovarian failure: Secondary | ICD-10-CM

## 2023-11-02 DIAGNOSIS — E1165 Type 2 diabetes mellitus with hyperglycemia: Secondary | ICD-10-CM | POA: Diagnosis not present

## 2023-11-02 NOTE — Assessment & Plan Note (Signed)
Chronic, but now getting different types of HA and other symptoms since fall 1 yr ago.  Advised migraines can vay and more than one type.  Mri-unchanged. (no triptans as CAD).  Also, tramadol 50mg  and zofran if severe.  Nurtec not covered.  Not as bad as prior visit.  Trial Mg 400mg  daily as well.  Keep log.

## 2023-11-02 NOTE — Assessment & Plan Note (Signed)
Chronic.  Suspect POTS/autonomic dysfunction.  Pt wearing compression stockings.  Also, can be vestibular migraines.  Only did 2 sessions of PT.  She will consider restarting

## 2023-11-02 NOTE — Patient Instructions (Addendum)
It was very nice to see you today!  Magnesium 400mg  daily.  Get Tdap at pharm.  RSV,  shingrix.   PLEASE NOTE:  If you had any lab tests please let us know if you have not heard back within a few days. You may see your results on MyChart before we have a chance to review them but we will give you a call once they are reviewed by [place]. If we ordered any referrals today, please let us know if you have not heard from their office within the next week.   Please try these tips to maintain a healthy lifestyle:  Eat most of your calories during the day when you are active. Eliminate processed foods including packaged sweets (pies, cakes, cookies), reduce intake of potatoes, white bread, white pasta, and white rice. Look for whole grain options, oat flour or almond flour.  Each meal should contain half fruits/vegetables, one quarter protein, and one quarter carbs (no bigger than a computer mouse).  Cut down on sweet beverages. This includes juice, soda, and sweet tea. Also watch fruit intake, though this is a healthier sweet option, it still contains natural sugar! Limit to 3 servings daily.  Drink at least 1 glass of water with each meal and aim for at least 8 glasses per day  Exercise at least 150 minutes every week.

## 2023-11-02 NOTE — Assessment & Plan Note (Signed)
Chronic.  Controlled on ozempic 2mg  weekly(gets from pt assistance).  Working on diet/exercise.  Ambivalent about stopping it but then wt issues and control of DM vs stopping.  Advised to apply for pt assist while window is still open and then can decide in January.

## 2023-11-02 NOTE — Progress Notes (Signed)
Subjective:     Patient ID: Christine [name], female    DOB: June 22, 1951, 71 y.o.   MRN: [id number]  Chief Complaint  Patient presents with   Medical Management of Chronic Issues    Follow-up on chronic issues Discuss Ozempic    HPI  DM, type 2 - on ozempic 2mg -taking weekly again as having hard time tracking every other week.  Her pt assist runs out end of December.  Will have enough till February.  Concerned about wt, but concerned about stopping and regaining and sugars going out of control.     Migraines - Pt reports her migraines have overall improved. Her last headache was today, lasted about 10 minutes before resolving. She tends to not need to take any medication to manage her headaches, but at times will take a Tylenol. She did not pick up her Nurtec rx due to high costs. Notes she is not sensitive to high pitched noises, but continues to endorse ears ringing. Discussed results of brain MRI from 10/12/2023. Pt reports hx of TIA.   Dizziness / Tachycardia - She reports episodes of dizziness that tend to occur when standing up after sitting for long periods or with quick positional changes. Endorses episode of dizziness today and on Sunday while at church. She has been monitoring her pulse and on average her pulse is 90 bpm. Highest reading was 116, on a day she had an episode of dizziness. She also reports hips and ears feel flushed and hot with positional changes. States her ears don't turn red, just feel hot.   Elevated BP  - She notes her BP has been elevated recently. Reports recent systolic readings in 150s. At initial check her BP was 152/82, when rechecked her BP improved to 134/72.   Ear ringing - She complains of her ear ringing. States she had a "head cold" recently and triggered ear ringing while coughing.   She has been taking B12 and vitamin D supplementation once weekly.   Mammogram and bone density are both due. Pt will call to schedule.    Health Maintenance Due   Topic Date Due   DTaP/Tdap/Td (2 - Tdap) 06/08/2019    Past Medical History:  Diagnosis Date   Acute bursitis of right shoulder 08/29/2018   Right shoulder injected in August 29, 2018   Allergic rhinitis    Closed displaced fracture of fifth metatarsal bone of left foot 03/16/2017   DEPRESSIVE DISORDER NOT ELSEWHERE CLASSIFIED 03/27/2010   Qualifier: Diagnosis of  By: Alwyn Ren MD, Chrissie Noa   Mr Skeet Latch, NP , [facility] [place] [place]     Diabetes (HCC) 04/02/2016   Diabetes mellitus    Essential hypertension 03/14/2008   Qualifier: Diagnosis of  By: Alwyn Ren MD, [name]     Fibromyalgia 08/12/2011   Diagnosed by Dr Jimmy Footman , Rheumatologist 2010    GERD (gastroesophageal reflux disease) 12/25/2016   Gluteal tendonitis of right buttock 10/19/2017   Injected in October 19, 2017 Injected October 07, 2018   Greater trochanteric bursitis of left hip 06/17/2016   Greater trochanteric bursitis of right hip 11/27/2015   Injected 11/27/2015    History of recurrent UTIs    [name]   HTN (hypertension)    Hx of osteopenia 10/11/2012   [facility]EXA; ordered by Dr Alwyn Ren Lowest T score: -2.1 @ lumbar spine @ [facility] 11/01/12; decrease of 8.5% vs 05/2009. There is 9.2% risk over 10 yrs History fracture left elbow @ 4 Fractured left  wrist , right elbow and nose post fall July/18/2013. Dr. Melvyn Novas No FH Osteoporosis No PMH of bisphosphonate therapy (generic Fosamax Rxed but not filled  due to polypharmacy )    Hyperlipidemia    Hyperlipidemia associated with type 2 diabetes mellitus (HCC) 01/18/2007   Qualifier: Diagnosis of  By: Daine Gip  With DM LDL goal = < 100, ideally < 70. Mi in father @ 44; 2 brothers @ 22 & 70     Lumbar radiculopathy 04/11/2008   Qualifier: Diagnosis of  By: Alwyn Ren MD, Christiane Ha well to epidural 01/12/2017  repeat epidural given May 2018   Migraine headache    quiescent   Nonallopathic lesion of cervical region 07/19/2014   Nonallopathic lesion of  lumbosacral region 01/15/2016   Nonallopathic lesion of sacral region 06/27/2018   Nonallopathic lesion-rib cage 09/26/2014   Palpitations 04/15/2011   Rotator cuff impingement syndrome of left shoulder 05/11/2014   Seasonal allergic rhinitis 03/21/2012   Sinusitis, chronic 04/20/2016   Sleep disorder    Dr Dohmier   Subluxation of peroneal tendon of right foot 11/10/2017   TIA (transient ischemic attack)    TRANSIENT ISCHEMIC ATTACKS, HX OF 02/02/2008   Qualifier: Diagnosis of  By: Magda Bernheim ADVRS EFF UNS RX MEDICINAL&BIOLOGICAL SBSTNC 02/02/2008   Qualifier: Diagnosis of  By: Alwyn Ren MD, Myer Peer AND MYOSITIS 02/02/2008   Qualifier: Diagnosis of  By: Alwyn Ren MD, [name]     UTI (urinary tract infection) 10/05/2014   Viral upper respiratory tract infection with cough 12/31/2014   Vitamin D deficiency 05/25/2008   Qualifier: Diagnosis of  By: Alwyn Ren MD, Chrissie Noa      Past Surgical History:  Procedure Laterality Date   COLONOSCOPY     Dr Leone Payor; hemorrhoids   CORONARY ANGIOPLASTY  12/15/1995   for chest pain- negative    FOOT MASS EXCISION Left 04/08/2023   KNEE ARTHROSCOPY Left 2023   POLYPECTOMY  12/14/2000   benign, hyperplastic polyp; rectal bleeding 2008   TONSILLECTOMY     TOTAL ABDOMINAL HYSTERECTOMY  12/14/1981   BSO for endometriosis   WISDOM TOOTH EXTRACTION       Current Outpatient Medications:    acetaminophen (TYLENOL) 500 MG tablet, Take 500 mg by mouth as needed., Disp: , Rfl:    aspirin 81 MG tablet, Take 81 mg by mouth daily., Disp: , Rfl:    atorvastatin (LIPITOR) 40 MG tablet, TAKE 1 TABLET BY MOUTH EVERY DAY, Disp: 90 tablet, Rfl: 3   cephALEXin (KEFLEX) 250 MG capsule, TAKE 1 CAPSULE BY MOUTH DAILY., Disp: 90 capsule, Rfl: 1   DULoxetine (CYMBALTA) 60 MG capsule, Take 60 mg by mouth daily. Taking 90 mg daily, Disp: , Rfl:    glucose blood (ONETOUCH ULTRA) test strip, 1 each by Other route daily at 12 noon. Use as instructed,  Disp: 100 strip, Rfl: 3   Inclisiran Sodium (LEQVIO [place]), Inject into the skin., Disp: , Rfl:    Lancets (ONETOUCH ULTRASOFT) lancets, Check blood sugar once daily. Dx code: 250.00, Disp: 100 each, Rfl: 12   LORazepam (ATIVAN) 0.5 MG tablet, Take 0.5 mg by mouth as needed for anxiety., Disp: , Rfl:    metoprolol succinate (TOPROL-XL) 25 MG 24 hr tablet, TAKE 1/2 TABLET BY MOUTH DAILY, Disp: 45 tablet, Rfl: 1   omeprazole (PRILOSEC) 20 MG capsule, TAKE 1 CAPSULE BY MOUTH EVERY DAY, Disp: 90 capsule, Rfl: 3   Semaglutide, 2 MG/DOSE, (OZEMPIC,  2 MG/DOSE,) 8 MG/3ML SOPN, Inject 2 mg into the skin once a week. Gets from pt assist, Disp: 2 mL, Rfl: 0   tiZANidine (ZANAFLEX) 4 MG tablet, TAKE 1 TABLET BY MOUTH AT BEDTIME., Disp: 90 tablet, Rfl: 1   traZODone (DESYREL) 50 MG tablet, TAKE 1/2 TO 1 TABLET BY MOUTH AT BEDTIME AS NEEDED FOR SLEEP, Disp: 90 tablet, Rfl: 1   vitamin B-12 (CYANOCOBALAMIN) 1000 MCG tablet, Take 1,000 mcg by mouth daily., Disp: , Rfl:    Vitamin D, Ergocalciferol, (DRISDOL) 1.25 MG (50000 UNIT) CAPS capsule, TAKE 1 CAPSULE BY MOUTH ONE TIME PER WEEK, Disp: 12 capsule, Rfl: 0  Allergies  Allergen Reactions   Vioxx [Rofecoxib] Other (See Comments)    Excess BP which caused TIA    Prednisone Other (See Comments)    Mental status changes with high-dose oral agent. Tolerates epidural steroid injections. No associated rash or fever   Macrodantin [Nitrofurantoin Macrocrystal] Hives   Sulfa Antibiotics     Hives   Zetia [Ezetimibe]     myalgias   Colesevelam Other (See Comments)    Leg Pain   ROS neg/noncontributory except as noted HPI/below      Objective:     BP 134/72 (BP Location: Left Arm, Patient Position: Sitting, Cuff Size: Large)   Pulse 81   Temp 97.7 F (36.5 C) (Temporal)   Resp 16   Ht 5\' 6"  (1.676 m)   Wt 138 lb 8 oz (62.8 kg)   LMP  (LMP Unknown)   SpO2 99%   BMI 22.35 kg/m  Wt Readings from Last 3 Encounters:  11/02/23 138 lb 8 oz (62.8 kg)   10/01/23 138 lb (62.6 kg)  09/30/23 136 lb (61.7 kg)    Physical Exam   Gen: WDWN NAD HEENT: NCAT, conjunctiva not injected, sclera nonicteric MSK: no gross abnormalities.  NEURO: A&O x3.  CN II-XII intact.  PSYCH: normal mood. Good eye contact  Reviewed and discussed brain MRI w/pt     Assessment & Plan:  Type 2 diabetes mellitus with hyperglycemia, without long-term current use of insulin (HCC) Assessment & Plan: Chronic.  Controlled on ozempic 2mg  weekly(gets from pt assistance).  Working on diet/exercise.  Ambivalent about stopping it but then wt issues and control of DM vs stopping.  Advised to apply for pt assist while window is still open and then can decide in January.    Migraine without aura and without status migrainosus, not intractable Assessment & Plan: Chronic, but now getting different types of HA and other symptoms since fall 1 yr ago.  Advised migraines can vay and more than one type.  Mri-unchanged. (no triptans as CAD).  Also, tramadol 50mg  and zofran if severe.  Nurtec not covered.  Not as bad as prior visit.  Trial Mg 400mg  daily as well.  Keep log.    Dizziness Assessment & Plan: Chronic.  Suspect POTS/autonomic dysfunction.  Pt wearing compression stockings.  Also, can be vestibular migraines.  Only did 2 sessions of PT.  She will consider restarting   Estrogen deficiency -     DG Bone Density; Future  Long-term current use of injectable noninsulin antidiabetic medication     Return for as sch.  I, Isabelle Course, acting as a scribe for Angelena Sole, MD., have documented all relevant documentation on the behalf of Angelena Sole, MD, as directed by  Angelena Sole, MD while in the presence of Angelena Sole, MD.  I, Angelena Sole,  MD, have reviewed all documentation for this visit. The documentation on 11/02/23 for the exam, diagnosis, procedures, and orders are all accurate and complete.  Angelena Sole, MD

## 2023-11-09 ENCOUNTER — Other Ambulatory Visit: Payer: Self-pay | Admitting: Family Medicine

## 2023-11-09 DIAGNOSIS — Z1231 Encounter for screening mammogram for malignant neoplasm of breast: Secondary | ICD-10-CM

## 2023-11-17 NOTE — Progress Notes (Unsigned)
[name] [address] [place] [address] Phone: [phone] Subjective:   Bruce Donath, am serving as a scribe for Dr. Antoine Primas.  I'm seeing this patient by the request  of:  Jeani Sow, MD  CC: back and neck pain   ONG:[id number]  Christine [name] is a 72 y.o. female coming in with complaint of back and neck pain. OMT 10/01/2023. Patient states that cervical spine has been catching when she flexes neck. Working on stretching neck when it happens and this helps.   Lower back is the same as last visit. Pain worse with standing.   Medications patient has been prescribed:None  Taking:         Reviewed prior external information including notes and imaging from previsou exam, outside providers and external EMR if available.   As well as notes that were available from care everywhere and other healthcare systems.  Past medical history, social, surgical and family history all reviewed in electronic medical record.  No pertanent information unless stated regarding to the chief complaint.   Past Medical History:  Diagnosis Date   Acute bursitis of right shoulder 08/29/2018   Right shoulder injected in August 29, 2018   Allergic rhinitis    Closed displaced fracture of fifth metatarsal bone of left foot 03/16/2017   DEPRESSIVE DISORDER NOT ELSEWHERE CLASSIFIED 03/27/2010   Qualifier: Diagnosis of  By: Alwyn Ren MD, Chrissie Noa   Mr Skeet Latch, NP , [facility] [place] [place]     Diabetes (HCC) 04/02/2016   Diabetes mellitus    Essential hypertension 03/14/2008   Qualifier: Diagnosis of  By: Alwyn Ren MD, [name]     Fibromyalgia 08/12/2011   Diagnosed by Dr Jimmy Footman , Rheumatologist 2010    GERD (gastroesophageal reflux disease) 12/25/2016   Gluteal tendonitis of right buttock 10/19/2017   Injected in October 19, 2017 Injected October 07, 2018   Greater trochanteric bursitis of left hip 06/17/2016   Greater trochanteric bursitis  of right hip 11/27/2015   Injected 11/27/2015    History of recurrent UTIs    [name]   HTN (hypertension)    Hx of osteopenia 10/11/2012   [facility] DEXA; ordered by Dr Alwyn Ren Lowest T score: -2.1 @ lumbar spine @ [facility] 11/01/12; decrease of 8.5% vs 05/2009. There is 9.2% risk over 10 yrs History fracture left elbow @ 6 Fractured left wrist , right elbow and nose post fall July/18/2013. Dr. Melvyn Novas No FH Osteoporosis No PMH of bisphosphonate therapy (generic Fosamax Rxed but not filled  due to polypharmacy )    Hyperlipidemia    Hyperlipidemia associated with type 2 diabetes mellitus (HCC) 01/18/2007   Qualifier: Diagnosis of  By: Daine Gip  With DM LDL goal = < 100, ideally < 70. Mi in father @ 96; 2 brothers @ 36 & 67     Lumbar radiculopathy 04/11/2008   Qualifier: Diagnosis of  By: Alwyn Ren MD, Christiane Ha well to epidural 01/12/2017  repeat epidural given May 2018   Migraine headache    quiescent   Nonallopathic lesion of cervical region 07/19/2014   Nonallopathic lesion of lumbosacral region 01/15/2016   Nonallopathic lesion of sacral region 06/27/2018   Nonallopathic lesion-rib cage 09/26/2014   Palpitations 04/15/2011   Rotator cuff impingement syndrome of left shoulder 05/11/2014   Seasonal allergic rhinitis 03/21/2012   Sinusitis, chronic 04/20/2016   Sleep disorder    Dr Dohmier   Subluxation of peroneal tendon of right  foot 11/10/2017   TIA (transient ischemic attack)    TRANSIENT ISCHEMIC ATTACKS, HX OF 02/02/2008   Qualifier: Diagnosis of  By: Magda Bernheim ADVRS EFF UNS RX MEDICINAL&BIOLOGICAL SBSTNC 02/02/2008   Qualifier: Diagnosis of  By: Alwyn Ren MD, Myer Peer AND MYOSITIS 02/02/2008   Qualifier: Diagnosis of  By: Alwyn Ren MD, Chrissie Noa     UTI (urinary tract infection) 10/05/2014   Viral upper respiratory tract infection with cough 12/31/2014   Vitamin D deficiency 05/25/2008   Qualifier: Diagnosis of  By: Alwyn Ren MD, Chrissie Noa      Allergies   Allergen Reactions   Vioxx [Rofecoxib] Other (See Comments)    Excess BP which caused TIA    Prednisone Other (See Comments)    Mental status changes with high-dose oral agent. Tolerates epidural steroid injections. No associated rash or fever   Macrodantin [Nitrofurantoin Macrocrystal] Hives   Sulfa Antibiotics     Hives   Zetia [Ezetimibe]     myalgias   Colesevelam Other (See Comments)    Leg Pain     Review of Systems:  No , visual changes, nausea, vomiting, diarrhea, constipation, , abdominal pain, skin rash, fevers, chills, night sweats, weight loss, swollen lymph nodes, body aches, joint swelling, chest pain, shortness of breath, mood changes. POSITIVE muscle aches, headaches, dizziness  Objective  Blood pressure 132/82, pulse 75, height 5\' 6"  (1.676 m), weight 136 lb (61.7 kg), SpO2 95%.   General: No apparent distress alert and oriented x3 mood and affect normal, dressed appropriately.  HEENT: Pupils equal, extraocular movements intact  Respiratory: Patient's speak in full sentences and does not appear short of breath  Cardiovascular: No lower extremity edema, non tender, no erythema  Gait relatively normal MSK:  Back does have some loss lordosis noted.  Some tenderness to palpation in the paraspinal musculature.  Neck exam does have some limited sidebending to the right and left.  Osteopathic findings  C2 flexed rotated and side bent right C3 flexed rotated and side bent left C5 flexed rotated and side bent left T3 extended rotated and side bent right inhaled rib T9 extended rotated and side bent left L2 flexed rotated and side bent right Sacrum right on right       Assessment and Plan:  Cervical disc disorder with radiculopathy of cervical region Known arthritic changes and responded extremely well to osteopathic manipulation today.  Is having more dizziness but on ultrasound does appear that patient does have a chronic sinusitis and referred  appropriately.  Patient would just like to have see if that potentially could be giving her some of the ringing in the ear as well.  Differential includes some trigeminal neuralgia that I would be concerned about as well.  Discussed which activities to do and which ones to avoid.  Increase activity slowly otherwise.  Follow-up again in 6 to 8 weeks otherwise.  Lumbar radiculopathy Tightness in the low back.  Did respond extremely well though to osteopathic manipulation.  Increase activity slowly.  Increase activity follow-up again in 6 to 8 weeks.    Nonallopathic problems  Decision today to treat with OMT was based on Physical Exam  After verbal consent patient was treated with HVLA, ME, FPR techniques in cervical, rib, thoracic, lumbar, and sacral  areas  Patient tolerated the procedure well with improvement in symptoms  Patient given exercises, stretches and lifestyle modifications  See medications in patient instructions if given  Patient will  follow up in 4-8 weeks    The above documentation has been reviewed and is accurate and complete Judi Saa, DO          Note: This dictation was prepared with Dragon dictation along with smaller phrase technology. Any transcriptional errors that result from this process are unintentional.

## 2023-11-18 ENCOUNTER — Ambulatory Visit: Payer: Medicare Other | Admitting: Family Medicine

## 2023-11-18 ENCOUNTER — Ambulatory Visit (INDEPENDENT_AMBULATORY_CARE_PROVIDER_SITE_OTHER): Payer: Medicare Other

## 2023-11-18 ENCOUNTER — Encounter: Payer: Self-pay | Admitting: Family Medicine

## 2023-11-18 VITALS — BP 132/82 | HR 75 | Ht 66.0 in | Wt 136.0 lb

## 2023-11-18 DIAGNOSIS — M542 Cervicalgia: Secondary | ICD-10-CM

## 2023-11-18 DIAGNOSIS — M9901 Segmental and somatic dysfunction of cervical region: Secondary | ICD-10-CM | POA: Diagnosis not present

## 2023-11-18 DIAGNOSIS — M5010 Cervical disc disorder with radiculopathy, unspecified cervical region: Secondary | ICD-10-CM

## 2023-11-18 DIAGNOSIS — M9902 Segmental and somatic dysfunction of thoracic region: Secondary | ICD-10-CM | POA: Diagnosis not present

## 2023-11-18 DIAGNOSIS — M9908 Segmental and somatic dysfunction of rib cage: Secondary | ICD-10-CM | POA: Diagnosis not present

## 2023-11-18 DIAGNOSIS — M47812 Spondylosis without myelopathy or radiculopathy, cervical region: Secondary | ICD-10-CM | POA: Diagnosis not present

## 2023-11-18 DIAGNOSIS — J329 Chronic sinusitis, unspecified: Secondary | ICD-10-CM | POA: Diagnosis not present

## 2023-11-18 DIAGNOSIS — M9904 Segmental and somatic dysfunction of sacral region: Secondary | ICD-10-CM

## 2023-11-18 DIAGNOSIS — M5416 Radiculopathy, lumbar region: Secondary | ICD-10-CM

## 2023-11-18 DIAGNOSIS — M4802 Spinal stenosis, cervical region: Secondary | ICD-10-CM | POA: Diagnosis not present

## 2023-11-18 DIAGNOSIS — M9903 Segmental and somatic dysfunction of lumbar region: Secondary | ICD-10-CM

## 2023-11-18 NOTE — Patient Instructions (Addendum)
[facility] referral Xray today Tramadol 1/2 tab with Tylenol See me again in 6 weeks

## 2023-11-18 NOTE — Assessment & Plan Note (Signed)
Tightness in the low back.  Did respond extremely well though to osteopathic manipulation.  Increase activity slowly.  Increase activity follow-up again in 6 to 8 weeks.

## 2023-11-18 NOTE — Assessment & Plan Note (Signed)
Known arthritic changes and responded extremely well to osteopathic manipulation today.  Is having more dizziness but on ultrasound does appear that patient does have a chronic sinusitis and referred appropriately.  Patient would just like to have see if that potentially could be giving her some of the ringing in the ear as well.  Differential includes some trigeminal neuralgia that I would be concerned about as well.  Discussed which activities to do and which ones to avoid.  Increase activity slowly otherwise.  Follow-up again in 6 to 8 weeks otherwise.

## 2023-11-19 ENCOUNTER — Encounter (INDEPENDENT_AMBULATORY_CARE_PROVIDER_SITE_OTHER): Payer: Self-pay | Admitting: Otolaryngology

## 2023-11-24 ENCOUNTER — Ambulatory Visit: Payer: Medicare Other

## 2023-11-24 ENCOUNTER — Encounter: Payer: Self-pay | Admitting: Family Medicine

## 2023-11-24 VITALS — BP 156/72 | HR 86 | Temp 97.8°F | Resp 18 | Ht 66.0 in | Wt 136.8 lb

## 2023-11-24 DIAGNOSIS — E785 Hyperlipidemia, unspecified: Secondary | ICD-10-CM

## 2023-11-24 DIAGNOSIS — I2510 Atherosclerotic heart disease of native coronary artery without angina pectoris: Secondary | ICD-10-CM | POA: Diagnosis not present

## 2023-11-24 DIAGNOSIS — E1169 Type 2 diabetes mellitus with other specified complication: Secondary | ICD-10-CM | POA: Diagnosis not present

## 2023-11-24 MED ORDER — INCLISIRAN SODIUM 284 MG/1.5ML ~~LOC~~ SOSY
284.0000 mg | PREFILLED_SYRINGE | Freq: Once | SUBCUTANEOUS | Status: AC
  Administered 2023-11-24: 284 mg via SUBCUTANEOUS
  Filled 2023-11-24: qty 1.5

## 2023-11-24 NOTE — Progress Notes (Signed)
Diagnosis: Hyperlipidemia  Provider:  Chilton Greathouse MD  Procedure: Injection  Leqvio (inclisiran), Dose: 284 mg, Site: subcutaneous, Number of injections: 1  Injection Site(s): Left arm  Post Care: Patient declined observation  Discharge: Condition: Good, Destination: Home . AVS Declined  Performed by:  Loney Hering, LPN

## 2023-11-24 NOTE — Telephone Encounter (Signed)
Nurse visit scheduled for Toradol and Depo-Medrol.

## 2023-11-25 ENCOUNTER — Ambulatory Visit: Payer: Medicare Other

## 2023-11-25 DIAGNOSIS — M5416 Radiculopathy, lumbar region: Secondary | ICD-10-CM | POA: Diagnosis not present

## 2023-11-25 DIAGNOSIS — M797 Fibromyalgia: Secondary | ICD-10-CM | POA: Diagnosis not present

## 2023-11-25 DIAGNOSIS — M542 Cervicalgia: Secondary | ICD-10-CM

## 2023-11-25 MED ORDER — METHYLPREDNISOLONE ACETATE 40 MG/ML IJ SUSP
40.0000 mg | Freq: Once | INTRAMUSCULAR | Status: AC
  Administered 2023-11-25: 40 mg via INTRAMUSCULAR

## 2023-11-25 MED ORDER — KETOROLAC TROMETHAMINE 30 MG/ML IJ SOLN
30.0000 mg | Freq: Once | INTRAMUSCULAR | Status: AC
  Administered 2023-11-25: 30 mg via INTRAMUSCULAR

## 2023-11-25 NOTE — Progress Notes (Signed)
Confirmed with Dr. Katrinka Blazing, OK to administer - contra-indications listed on allergy list.   Discussed possible side effects of both medications, pt agreeable to receiving Methylprednisolone and Ketorolac today.   Pt received Methylprednisolone, 40 mg/mL, 1 mL, IM, right upper outer gluteal region  Pt received Ketorolac 30 mg/mL, 1 mL, IM, rleft upper outer gluteal region

## 2023-11-29 ENCOUNTER — Telehealth: Payer: Self-pay | Admitting: Pharmacy Technician

## 2023-11-29 NOTE — Telephone Encounter (Signed)
Pharmacy Patient Advocate Encounter  Received notification from [facility] that Prior Authorization for Nurtec 75mg  has been APPROVED from 12/14/22 to 09/29/24. Patient filled 10/08/23 at [facility].   PA #/Case ID/Reference #: [id number]

## 2023-12-21 ENCOUNTER — Ambulatory Visit: Payer: Medicare Other

## 2023-12-27 ENCOUNTER — Other Ambulatory Visit: Payer: Self-pay

## 2023-12-27 ENCOUNTER — Telehealth: Payer: Self-pay | Admitting: Family Medicine

## 2023-12-27 MED ORDER — TIZANIDINE HCL 4 MG PO TABS: 4.0000 mg | ORAL_TABLET | Freq: Every day | ORAL | 1 refills | Status: DC

## 2023-12-27 NOTE — Telephone Encounter (Signed)
 Pt called for refill of Tizanidine to [facility] [address]. She does not take often and rx on file has expired.

## 2023-12-28 DIAGNOSIS — F410 Panic disorder [episodic paroxysmal anxiety] without agoraphobia: Secondary | ICD-10-CM | POA: Diagnosis not present

## 2023-12-28 DIAGNOSIS — F339 Major depressive disorder, recurrent, unspecified: Secondary | ICD-10-CM | POA: Diagnosis not present

## 2023-12-31 ENCOUNTER — Encounter: Payer: Self-pay | Admitting: Family Medicine

## 2023-12-31 ENCOUNTER — Ambulatory Visit (INDEPENDENT_AMBULATORY_CARE_PROVIDER_SITE_OTHER): Payer: Medicare Other | Admitting: Family Medicine

## 2023-12-31 VITALS — BP 120/80 | HR 74 | Temp 97.2°F | Resp 18 | Ht 66.0 in | Wt 135.4 lb

## 2023-12-31 DIAGNOSIS — R1030 Lower abdominal pain, unspecified: Secondary | ICD-10-CM | POA: Diagnosis not present

## 2023-12-31 DIAGNOSIS — L989 Disorder of the skin and subcutaneous tissue, unspecified: Secondary | ICD-10-CM

## 2023-12-31 DIAGNOSIS — E1165 Type 2 diabetes mellitus with hyperglycemia: Secondary | ICD-10-CM

## 2023-12-31 DIAGNOSIS — G43009 Migraine without aura, not intractable, without status migrainosus: Secondary | ICD-10-CM

## 2023-12-31 DIAGNOSIS — Z7985 Long-term (current) use of injectable non-insulin antidiabetic drugs: Secondary | ICD-10-CM | POA: Diagnosis not present

## 2023-12-31 DIAGNOSIS — R42 Dizziness and giddiness: Secondary | ICD-10-CM

## 2023-12-31 LAB — POC URINALSYSI DIPSTICK (AUTOMATED)
Bilirubin, UA: NEGATIVE
Blood, UA: POSITIVE
Glucose, UA: NEGATIVE
Ketones, UA: NEGATIVE
Leukocytes, UA: NEGATIVE — AB
Nitrite, UA: NEGATIVE
Protein, UA: NEGATIVE
Spec Grav, UA: 1.015 (ref 1.010–1.025)
Urobilinogen, UA: 0.2 U/dL
pH, UA: 6 (ref 5.0–8.0)

## 2023-12-31 LAB — CBC WITH DIFFERENTIAL/PLATELET
Basophils Absolute: 0.1 10*3/uL (ref 0.0–0.1)
Basophils Relative: 1 % (ref 0.0–3.0)
Eosinophils Absolute: 0.1 10*3/uL (ref 0.0–0.7)
Eosinophils Relative: 2.1 % (ref 0.0–5.0)
HCT: 39 % (ref 36.0–46.0)
Hemoglobin: 13.1 g/dL (ref 12.0–15.0)
Lymphocytes Relative: 33.8 % (ref 12.0–46.0)
Lymphs Abs: 1.9 10*3/uL (ref 0.7–4.0)
MCHC: 33.7 g/dL (ref 30.0–36.0)
MCV: 91.1 fL (ref 78.0–100.0)
Monocytes Absolute: 0.4 10*3/uL (ref 0.1–1.0)
Monocytes Relative: 7.4 % (ref 3.0–12.0)
Neutro Abs: 3.1 10*3/uL (ref 1.4–7.7)
Neutrophils Relative %: 55.7 % (ref 43.0–77.0)
Platelets: 261 10*3/uL (ref 150.0–400.0)
RBC: 4.28 Mil/uL (ref 3.87–5.11)
RDW: 13.8 % (ref 11.5–15.5)
WBC: 5.5 10*3/uL (ref 4.0–10.5)

## 2023-12-31 LAB — COMPREHENSIVE METABOLIC PANEL
ALT: 25 U/L (ref 0–35)
AST: 20 U/L (ref 0–37)
Albumin: 4.3 g/dL (ref 3.5–5.2)
Alkaline Phosphatase: 63 U/L (ref 39–117)
BUN: 14 mg/dL (ref 6–23)
CO2: 27 meq/L (ref 19–32)
Calcium: 9 mg/dL (ref 8.4–10.5)
Chloride: 100 meq/L (ref 96–112)
Creatinine, Ser: 0.72 mg/dL (ref 0.40–1.20)
GFR: 82.28 mL/min (ref >60.00)
Glucose, Bld: 139 mg/dL — ABNORMAL HIGH (ref 70–99)
Potassium: 3.6 meq/L (ref 3.5–5.1)
Sodium: 136 meq/L (ref 135–145)
Total Bilirubin: 0.5 mg/dL (ref 0.2–1.2)
Total Protein: 6.7 g/dL (ref 6.0–8.3)

## 2023-12-31 LAB — HEMOGLOBIN A1C: Hgb A1c MFr Bld: 7.3 % — ABNORMAL HIGH (ref 4.6–6.5)

## 2023-12-31 MED ORDER — METOPROLOL SUCCINATE ER 25 MG PO TB24: 12.5000 mg | ORAL_TABLET | Freq: Every day | ORAL | 1 refills | Status: DC

## 2023-12-31 NOTE — Patient Instructions (Signed)
Let me know if pain still on Monday

## 2023-12-31 NOTE — Progress Notes (Unsigned)
Subjective:     Patient ID: STAR PRIMER, female    DOB: 08-20-1951, 73 y.o.   MRN: [id number]  Chief Complaint  Patient presents with   Medical Management of Chronic Issues    3 month follow-up  Fasting    Abdominal Pain    Abdominal pain and cramping that started 2 days ago, possible UTI    HPI  DM, type 2 - on ozempic 2mg -taking weekly as having hard time tracking every other week.  Her pt assist ran out end of December.  Will have enough till February.  Concerned about wt, but concerned about stopping and regaining and sugars going out of control.   Sugars 104-150  Migraines - Pt reports her migraines have overall improved. She tends to not need to take any medication to manage her headaches, but at times will take a Tylenol. She did not pick up her Nurtec rx due to high costs. Notes she is not sensitive to high pitched noises, but continues to endorse ears ringing. neg brain MRI from 10/12/2023. Pt reports hx of TIA.  Occ takes tramadol  will see Dr. Allena Katz next month(ENT)-tinnitis, R side of face "stopped up" freq  Dizziness / Tachycardia - She reports episodes of dizziness that tend to occur when standing up after sitting for long periods or with quick positional changes. Endorses episode of dizziness today and on Sunday while at church. She has been monitoring her pulse and on average her pulse is 90 bpm. Highest reading was 116, on a day she had an episode of dizziness. She also reports hips and ears feel flushed and hot with positional changes. States her ears don't turn red, just feel hot.  Tizanidine seems to trigger as well.   Elevated BP  -  doing better  She has been taking B12 and vitamin D supplementation once weekly.   Mammogram and bone density are both due. Pt  scheduled.  7.  UTI   There are no preventive care reminders to display for this patient.   Past Medical History:  Diagnosis Date   Acute bursitis of right shoulder 08/29/2018   Right shoulder  injected in August 29, 2018   Allergic rhinitis    Closed displaced fracture of fifth metatarsal bone of left foot 03/16/2017   DEPRESSIVE DISORDER NOT ELSEWHERE CLASSIFIED 03/27/2010   Qualifier: Diagnosis of  By: Alwyn Ren MD, Chrissie Noa   Mr Skeet Latch, NP , [facility] [place]     Diabetes (HCC) 04/02/2016   Diabetes mellitus    Essential hypertension 03/14/2008   Qualifier: Diagnosis of  By: Alwyn Ren MD, [name]     Fibromyalgia 08/12/2011   Diagnosed by Dr Jimmy Footman , Rheumatologist 2010    GERD (gastroesophageal reflux disease) 12/25/2016   Gluteal tendonitis of right buttock 10/19/2017   Injected in October 19, 2017 Injected October 07, 2018   Greater trochanteric bursitis of left hip 06/17/2016   Greater trochanteric bursitis of right hip 11/27/2015   Injected 11/27/2015    History of recurrent UTIs    [name]   HTN (hypertension)    Hx of osteopenia 10/11/2012   [facility] DEXA; ordered by Dr Alwyn Ren Lowest T score: -2.1 @ lumbar spine @ [facility] 11/01/12; decrease of 8.5% vs 05/2009. There is 9.2% risk over 10 yrs History fracture left elbow @ 4 Fractured left wrist , right elbow and nose post fall July/18/2013. Dr. Melvyn Novas No FH Osteoporosis No PMH of bisphosphonate therapy (generic Fosamax Rxed but not  filled  due to polypharmacy )    Hyperlipidemia    Hyperlipidemia associated with type 2 diabetes mellitus (HCC) 01/18/2007   Qualifier: Diagnosis of  By: Daine Gip  With DM LDL goal = < 100, ideally < 70. Mi in father @ 51; 2 brothers @ 62 & 25     Lumbar radiculopathy 04/11/2008   Qualifier: Diagnosis of  By: Alwyn Ren MD, Christiane Ha well to epidural 01/12/2017  repeat epidural given May 2018   Migraine headache    quiescent   Nonallopathic lesion of cervical region 07/19/2014   Nonallopathic lesion of lumbosacral region 01/15/2016   Nonallopathic lesion of sacral region 06/27/2018   Nonallopathic lesion-rib cage 09/26/2014   Palpitations 04/15/2011   Rotator cuff  impingement syndrome of left shoulder 05/11/2014   Seasonal allergic rhinitis 03/21/2012   Sinusitis, chronic 04/20/2016   Sleep disorder    Dr Dohmier   Subluxation of peroneal tendon of right foot 11/10/2017   TIA (transient ischemic attack)    TRANSIENT ISCHEMIC ATTACKS, HX OF 02/02/2008   Qualifier: Diagnosis of  By: Magda Bernheim ADVRS EFF UNS RX MEDICINAL&BIOLOGICAL SBSTNC 02/02/2008   Qualifier: Diagnosis of  By: Alwyn Ren MD, Myer Peer AND MYOSITIS 02/02/2008   Qualifier: Diagnosis of  By: Alwyn Ren MD, [name]     UTI (urinary tract infection) 10/05/2014   Viral upper respiratory tract infection with cough 12/31/2014   Vitamin D deficiency 05/25/2008   Qualifier: Diagnosis of  By: Alwyn Ren MD, Chrissie Noa      Past Surgical History:  Procedure Laterality Date   COLONOSCOPY     Dr Leone Payor; hemorrhoids   CORONARY ANGIOPLASTY  12/15/1995   for chest pain- negative    FOOT MASS EXCISION Left 04/08/2023   KNEE ARTHROSCOPY Left 2023   POLYPECTOMY  12/14/2000   benign, hyperplastic polyp; rectal bleeding 2008   TONSILLECTOMY     TOTAL ABDOMINAL HYSTERECTOMY  12/14/1981   BSO for endometriosis   WISDOM TOOTH EXTRACTION       Current Outpatient Medications:    acetaminophen (TYLENOL) 500 MG tablet, Take 500 mg by mouth as needed., Disp: , Rfl:    aspirin 81 MG tablet, Take 81 mg by mouth daily., Disp: , Rfl:    atorvastatin (LIPITOR) 40 MG tablet, TAKE 1 TABLET BY MOUTH EVERY DAY, Disp: 90 tablet, Rfl: 3   cephALEXin (KEFLEX) 250 MG capsule, TAKE 1 CAPSULE BY MOUTH DAILY., Disp: 90 capsule, Rfl: 1   DULoxetine (CYMBALTA) 60 MG capsule, Take 60 mg by mouth daily. Taking 90 mg daily, Disp: , Rfl:    glucose blood (ONETOUCH ULTRA) test strip, 1 each by Other route daily at 12 noon. Use as instructed, Disp: 100 strip, Rfl: 3   Inclisiran Sodium (LEQVIO [place]), Inject into the skin., Disp: , Rfl:    Lancets (ONETOUCH ULTRASOFT) lancets, Check blood sugar once daily.  Dx code: 250.00, Disp: 100 each, Rfl: 12   LORazepam (ATIVAN) 0.5 MG tablet, Take 0.5 mg by mouth as needed for anxiety., Disp: , Rfl:    metoprolol succinate (TOPROL-XL) 25 MG 24 hr tablet, TAKE 1/2 TABLET BY MOUTH DAILY, Disp: 45 tablet, Rfl: 1   omeprazole (PRILOSEC) 20 MG capsule, TAKE 1 CAPSULE BY MOUTH EVERY DAY, Disp: 90 capsule, Rfl: 3   Semaglutide, 2 MG/DOSE, (OZEMPIC, 2 MG/DOSE,) 8 MG/3ML SOPN, Inject 2 mg into the skin once a week. Gets from pt assist, Disp: 2 mL, Rfl: 0  tiZANidine (ZANAFLEX) 4 MG tablet, Take 1 tablet (4 mg total) by mouth at bedtime., Disp: 90 tablet, Rfl: 1   traZODone (DESYREL) 50 MG tablet, TAKE 1/2 TO 1 TABLET BY MOUTH AT BEDTIME AS NEEDED FOR SLEEP, Disp: 90 tablet, Rfl: 1   vitamin B-12 (CYANOCOBALAMIN) 1000 MCG tablet, Take 1,000 mcg by mouth daily., Disp: , Rfl:    Vitamin D, Ergocalciferol, (DRISDOL) 1.25 MG (50000 UNIT) CAPS capsule, TAKE 1 CAPSULE BY MOUTH ONE TIME PER WEEK, Disp: 12 capsule, Rfl: 0  Allergies  Allergen Reactions   Vioxx [Rofecoxib] Other (See Comments)    Excess BP which caused TIA    Prednisone Other (See Comments)    Mental status changes with high-dose oral agent. Tolerates epidural steroid injections. No associated rash or fever   Macrodantin [Nitrofurantoin Macrocrystal] Hives   Sulfa Antibiotics     Hives   Zetia [Ezetimibe]     myalgias   Colesevelam Other (See Comments)    Leg Pain   ROS neg/noncontributory except as noted HPI/below      Objective:     BP 120/80   Pulse 74   Temp (!) 97.2 F (36.2 C) (Temporal)   Resp 18   Ht 5\' 6"  (1.676 m)   Wt 135 lb 6 oz (61.4 kg)   LMP  (LMP Unknown)   SpO2 96%   BMI 21.85 kg/m  Wt Readings from Last 3 Encounters:  12/31/23 135 lb 6 oz (61.4 kg)  11/24/23 136 lb 12.8 oz (62.1 kg)  11/18/23 136 lb (61.7 kg)    Physical Exam   Gen: WDWN NAD HEENT: NCAT, conjunctiva not injected, sclera nonicteric NECK:  supple, no thyromegaly, no nodes, no carotid  bruits CARDIAC: RRR, S1S2+, no murmur. DP 2+B LUNGS: CTAB. No wheezes ABDOMEN:  BS+, soft, mildly tender RLQ, No HSM, no masses EXT:  no edema MSK: no gross abnormalities.  NEURO: A&O x3.  CN II-XII intact.  PSYCH: normal mood. Good eye contact      Assessment & Plan:  Type 2 diabetes mellitus with hyperglycemia, without long-term current use of insulin (HCC) -     Comprehensive metabolic panel -     Hemoglobin A1c  Lower abdominal pain -     POCT Urinalysis Dipstick (Automated) -     CBC with Differential/Platelet -     Comprehensive metabolic panel -     Urine Culture     No follow-ups on file.   Angelena Sole, MD

## 2024-01-01 LAB — URINE CULTURE
MICRO NUMBER:: 15970305
Result:: NO GROWTH
SPECIMEN QUALITY:: ADEQUATE

## 2024-01-01 NOTE — Assessment & Plan Note (Signed)
Chronic.  Suspect POTS/autonomic dysfunction.  Pt wearing compression stockings.  Also, can be vestibular migraines.  May also be medss.

## 2024-01-01 NOTE — Assessment & Plan Note (Signed)
 Chronic.  Controlled on ozempic 2mg  weekly(gets from pt assistance).  Working on diet/exercise.  Ambivalent about stopping it but then wt issues and control of DM vs stopping.  Advised to apply for pt assist while window is still open and then can decide in January.

## 2024-01-01 NOTE — Assessment & Plan Note (Signed)
Chronic, mostly controlled.  Mri-unchanged. (no triptans as CAD).  Also, tramadol 50mg  and zofran if severe.  Nurtec not covered.

## 2024-01-02 ENCOUNTER — Encounter: Payer: Self-pay | Admitting: Family Medicine

## 2024-01-05 NOTE — Progress Notes (Signed)
[name] [address] [place] [address] Phone: [phone] Subjective:   Christine [name], am serving as a scribe for Dr. Antoine Primas.  I'm seeing this patient by the request  of:  Jeani Sow, MD  CC: Back and neck pain follow-up  [id number]  Christine [name] is a 73 y.o. female coming in with complaint of back and neck pain. OMT 11/18/2023. Patient states neck is hurting pretty bad.  Increasing in tightness, did travel for the holidays.  Feels like that may have contributed to some of the discomfort and pain.  Medications patient has been prescribed: Zanaflex  Taking: Zanaflex        MRI of the cervical spine in March 2021 did show multilevel spondylosis from C4-C7 worse right greater than left with potential nerve root impingement.  Reviewed prior external information including notes and imaging from previsou exam, outside providers and external EMR if available.   As well as notes that were available from care everywhere and other healthcare systems.  Past medical history, social, surgical and family history all reviewed in electronic medical record.  No pertanent information unless stated regarding to the chief complaint.   Past Medical History:  Diagnosis Date   Acute bursitis of right shoulder 08/29/2018   Right shoulder injected in August 29, 2018   Allergic rhinitis    Closed displaced fracture of fifth metatarsal bone of left foot 03/16/2017   DEPRESSIVE DISORDER NOT ELSEWHERE CLASSIFIED 03/27/2010   Qualifier: Diagnosis of  By: Alwyn Ren MD, Chrissie Noa   Mr Skeet Latch, NP , [facility] [place]     Diabetes (HCC) 04/02/2016   Diabetes mellitus    Essential hypertension 03/14/2008   Qualifier: Diagnosis of  By: Alwyn Ren MD, [name]     Fibromyalgia 08/12/2011   Diagnosed by Dr Jimmy Footman , Rheumatologist 2010    GERD (gastroesophageal reflux disease) 12/25/2016   Gluteal tendonitis of right buttock 10/19/2017    Injected in October 19, 2017 Injected October 07, 2018   Greater trochanteric bursitis of left hip 06/17/2016   Greater trochanteric bursitis of right hip 11/27/2015   Injected 11/27/2015    History of recurrent UTIs    [name]   HTN (hypertension)    Hx of osteopenia 10/11/2012   [facility] DEXA; ordered by Dr Alwyn Ren Lowest T score: -2.1 @ lumbar spine @ [facility] 11/01/12; decrease of 8.5% vs 05/2009. There is 9.2% risk over 10 yrs History fracture left elbow @ 4 Fractured left wrist , right elbow and nose post fall July/18/2013. Dr. Melvyn Novas No FH Osteoporosis No PMH of bisphosphonate therapy (generic Fosamax Rxed but not filled  due to polypharmacy )    Hyperlipidemia    Hyperlipidemia associated with type 2 diabetes mellitus (HCC) 01/18/2007   Qualifier: Diagnosis of  By: Daine Gip  With DM LDL goal = < 100, ideally < 70. Mi in father @ 39; 2 brothers @ 74 & 43     Lumbar radiculopathy 04/11/2008   Qualifier: Diagnosis of  By: Alwyn Ren MD, Christiane Ha well to epidural 01/12/2017  repeat epidural given May 2018   Migraine headache    quiescent   Nonallopathic lesion of cervical region 07/19/2014   Nonallopathic lesion of lumbosacral region 01/15/2016   Nonallopathic lesion of sacral region 06/27/2018   Nonallopathic lesion-rib cage 09/26/2014   Palpitations 04/15/2011   Rotator cuff impingement syndrome of left shoulder 05/11/2014   Seasonal allergic rhinitis 03/21/2012   Sinusitis,  chronic 04/20/2016   Sleep disorder    Dr Dohmier   Subluxation of peroneal tendon of right foot 11/10/2017   TIA (transient ischemic attack)    TRANSIENT ISCHEMIC ATTACKS, HX OF 02/02/2008   Qualifier: Diagnosis of  By: Magda Bernheim ADVRS EFF UNS RX MEDICINAL&BIOLOGICAL SBSTNC 02/02/2008   Qualifier: Diagnosis of  By: Alwyn Ren MD, Myer Peer AND MYOSITIS 02/02/2008   Qualifier: Diagnosis of  By: Alwyn Ren MD, Chrissie Noa     UTI (urinary tract infection) 10/05/2014   Viral upper  respiratory tract infection with cough 12/31/2014   Vitamin D deficiency 05/25/2008   Qualifier: Diagnosis of  By: Alwyn Ren MD, Chrissie Noa      Allergies  Allergen Reactions   Vioxx [Rofecoxib] Other (See Comments)    Excess BP which caused TIA    Prednisone Other (See Comments)    Mental status changes with high-dose oral agent. Tolerates epidural steroid injections. No associated rash or fever   Macrodantin [Nitrofurantoin Macrocrystal] Hives   Sulfa Antibiotics     Hives   Zetia [Ezetimibe]     myalgias   Colesevelam Other (See Comments)    Leg Pain     Review of Systems:  No  visual changes, nausea, vomiting, diarrhea, constipation, abdominal pain, skin rash, fevers, chills, night sweats, weight loss, swollen lymph nodes, body aches, joint swelling, chest pain, shortness of breath, mood changes. POSITIVE muscle aches, dizziness, headache  Objective  Blood pressure 122/80, pulse 71, height 5\' 6"  (1.676 m), weight 135 lb (61.2 kg).   General: No apparent distress alert and oriented x3 mood and affect normal, dressed appropriately.  HEENT: Pupils equal, extraocular movements intact  Respiratory: Patient's speak in full sentences and does not appear short of breath  Cardiovascular: No lower extremity edema, non tender, no erythema  MSK:  Back does have some loss of lordosis.  Neck exam no does have significant loss of lordosis.  Tightness noted in the occipital area.  Patient's pain seems to be out of proportion.  Seems to be having an exacerbation of some of the fibromyalgia.  Osteopathic findings  C2 flexed rotated and side bent right C6 flexed rotated and side bent left T3 extended rotated and side bent right inhaled rib T9 extended rotated and side bent left L2 flexed rotated and side bent right Sacrum right on right     Assessment and Plan:  Cervical disc disorder with radiculopathy of cervical region Chronic problem with some worsening symptoms.  Do believe that  patient is also having exacerbation of fibromyalgia.  Social determinant health includes patient has been unable to exercise for quite some time now.  Patient is going to continue to try to be active.  Patient likely has had good control of the diabetes at this moment.  Trying to decide if some of the pain is secondary to some of her Ozempic use but it has been very good for her other health so she is hesitant to stop it.  Continue to follow-up with her other providers including primary care.  I would like to see her again in 8 weeks for further evaluation and treatment    Nonallopathic problems  Decision today to treat with OMT was based on Physical Exam  After verbal consent patient was treated with HVLA, ME, FPR techniques in cervical, rib, thoracic, lumbar, and sacral  areas  Patient tolerated the procedure well with improvement in symptoms  Patient given exercises, stretches and  lifestyle modifications  See medications in patient instructions if given  Patient will follow up in 4-8 weeks    The above documentation has been reviewed and is accurate and complete Judi Saa, DO          Note: This dictation was prepared with Dragon dictation along with smaller phrase technology. Any transcriptional errors that result from this process are unintentional.

## 2024-01-06 ENCOUNTER — Ambulatory Visit (INDEPENDENT_AMBULATORY_CARE_PROVIDER_SITE_OTHER): Payer: Medicare Other | Admitting: Family Medicine

## 2024-01-06 VITALS — BP 122/80 | HR 71 | Ht 66.0 in | Wt 135.0 lb

## 2024-01-06 DIAGNOSIS — M9901 Segmental and somatic dysfunction of cervical region: Secondary | ICD-10-CM | POA: Diagnosis not present

## 2024-01-06 DIAGNOSIS — M9902 Segmental and somatic dysfunction of thoracic region: Secondary | ICD-10-CM | POA: Diagnosis not present

## 2024-01-06 DIAGNOSIS — M5010 Cervical disc disorder with radiculopathy, unspecified cervical region: Secondary | ICD-10-CM

## 2024-01-06 DIAGNOSIS — M797 Fibromyalgia: Secondary | ICD-10-CM | POA: Diagnosis not present

## 2024-01-06 DIAGNOSIS — M9904 Segmental and somatic dysfunction of sacral region: Secondary | ICD-10-CM

## 2024-01-06 DIAGNOSIS — M9908 Segmental and somatic dysfunction of rib cage: Secondary | ICD-10-CM

## 2024-01-06 DIAGNOSIS — M9903 Segmental and somatic dysfunction of lumbar region: Secondary | ICD-10-CM

## 2024-01-06 NOTE — Patient Instructions (Addendum)
Good to see you  Follow up with Dr. Allena Katz Follow up in 5-6 weeks

## 2024-01-08 ENCOUNTER — Encounter: Payer: Self-pay | Admitting: Family Medicine

## 2024-01-08 NOTE — Assessment & Plan Note (Signed)
Chronic problem with some worsening symptoms.  Do believe that patient is also having exacerbation of fibromyalgia.  Social determinant health includes patient has been unable to exercise for quite some time now.  Patient is going to continue to try to be active.  Patient likely has had good control of the diabetes at this moment.  Trying to decide if some of the pain is secondary to some of her Ozempic use but it has been very good for her other health so she is hesitant to stop it.  Continue to follow-up with her other providers including primary care.  I would like to see her again in 8 weeks for further evaluation and treatment

## 2024-01-13 ENCOUNTER — Telehealth: Payer: Self-pay | Admitting: Family Medicine

## 2024-01-13 NOTE — Telephone Encounter (Signed)
Patient dropped off document Patient Assistance Application, to be filled out by provider. Patient requested to send it back via Fax within 5-days. Document is located in providers tray at front office.Please advise at  [phone].

## 2024-01-13 NOTE — Telephone Encounter (Signed)
Form faxed back to Patient Assistance Program.

## 2024-01-13 NOTE — Telephone Encounter (Signed)
Form placed on providers desk

## 2024-01-18 ENCOUNTER — Encounter (INDEPENDENT_AMBULATORY_CARE_PROVIDER_SITE_OTHER): Payer: Self-pay

## 2024-01-18 ENCOUNTER — Ambulatory Visit (INDEPENDENT_AMBULATORY_CARE_PROVIDER_SITE_OTHER): Payer: Medicare Other | Admitting: Otolaryngology

## 2024-01-18 VITALS — BP 141/83 | HR 81 | Ht 66.0 in | Wt 135.0 lb

## 2024-01-18 DIAGNOSIS — G43009 Migraine without aura, not intractable, without status migrainosus: Secondary | ICD-10-CM | POA: Diagnosis not present

## 2024-01-18 DIAGNOSIS — J329 Chronic sinusitis, unspecified: Secondary | ICD-10-CM

## 2024-01-18 DIAGNOSIS — R2689 Other abnormalities of gait and mobility: Secondary | ICD-10-CM

## 2024-01-18 DIAGNOSIS — R448 Other symptoms and signs involving general sensations and perceptions: Secondary | ICD-10-CM | POA: Diagnosis not present

## 2024-01-18 DIAGNOSIS — R0982 Postnasal drip: Secondary | ICD-10-CM | POA: Diagnosis not present

## 2024-01-18 DIAGNOSIS — H938X3 Other specified disorders of ear, bilateral: Secondary | ICD-10-CM

## 2024-01-18 DIAGNOSIS — H6993 Unspecified Eustachian tube disorder, bilateral: Secondary | ICD-10-CM

## 2024-01-18 MED ORDER — AZELASTINE HCL 0.1 % NA SOLN: 2.0000 | Freq: Two times a day (BID) | NASAL | 12 refills | Status: DC

## 2024-01-18 MED ORDER — FLUTICASONE PROPIONATE 50 MCG/ACT NA SUSP: 2.0000 | Freq: Two times a day (BID) | NASAL | 6 refills | Status: DC

## 2024-01-18 NOTE — Patient Instructions (Addendum)
Use flonase two sprays each nostril twice daily Following the flonase, use astelin two sprays each nostril twice daily

## 2024-01-18 NOTE — Progress Notes (Signed)
 Dear Dr. Claudene, Here is my assessment for our mutual patient, Christine [name]. Thank you for allowing me the opportunity to care for your patient. Please do not hesitate to contact me should you have any other questions. Sincerely, Dr. Eldora [name]  Otolaryngology Clinic Note Referring provider: Dr. Claudene HPI:  Christine [name] is a 73 y.o. female kindly referred by Dr. Claudene for evaluation of sinusitis and ear complaints.  Initial visit (01/2024): She reports that she has chronic right facial pain, pressure, and headache. Feels like you have a bad tooth as far as facial pain/pressure goes.  No discolored drainage, no congestion, PND, and anterior rhinorrhea. She reports some right sided night time obstruction and dryness but otherwise no nasl sx. Started since around October 2024, no antecedent event. Has happened before but not like this. She did get a round of antibiotics, but did not help. Prior saw Dr. Skeet for migraines, now just sees PCP. She did have an MRI during these symptoms.  She reports she gets maybe 2 sinus infections/year - but this is different -- normally it's frontal headache and pressure only. She does have migraines. In addition, she reports that both ears feel stopped up, some ETD symptoms (popping/crackling) and muffling. No other ear sx including drainage, hearing decline, tinnitus. No drainage from ear, ear pain. Does have bilateral tinnitus which is chronic. Has some sensitivity to high pitched sounds. She uses nasal spray intermittently - unclear what it is. Normally does not use any other nasal sprays  In addition, She reports that when she turns her head and really only when standing from sitting up, sometimes she [name] have some lightheadedness/imbalance. Ongoing for at least a year; denies frank vertigo. Denotes she [name] have some pre-syncopal episodes (almost passed out) and Maybe feels flushed. Everything gets gray. She says that she was diagnosed with  orthostatic hypotension and has been trying to figure that out. She has tried vestibular rehab and has not helped.  H&N Surgery: no Personal or FHx of bleeding dz or anesthesia difficulty: no  GLP-1: ozempic  AP/AC: ASA 81  Tobacco: no. Lives in [place], [place].  PMHx:  T2DM, Migraines, HTN, Chronic UTI  Independent Review of Additional Tests or Records:  Dr. Carlie (05/06/2016): headaches, shooting pain and pressure in cheeks and forehead; MRI brian unremarkable; neurologist treating with gabapentin ; Dx: Headaches; Rx: rec neurologist f/u Dr. Claudene (11/18/2023): Back and neck pain, noted sinusitis on Ultrasound(?); some facial pain; Dx: Sinusitis; Rx: Ref ENT 08/20/2023 Christine [name] (Sports med): noted dizziness, Rx: f/u 6-8 weeks; Dx: headahce may be sinus related, Rx: Doxy Dr. Wendolyn IM (1/17/202): T2DM, dizziness; Dx: suspect POTS, could be vestibular migraines; Rx: upcoming ENT appt Dr. Okey [name] (03/23/2023): Noted orthostatic hypotension, improved; Rx: increase fluids and salt intake Vestibular rehab Christine [name] (11/16/2022): dizziness, low BP measurements and positional; Diagnostic testing including vestibular assessment: noted VOR deficits.   10/12/2023 MRI Brain independently reviewed with attention to ears and sinuses: paranasal sinuses clear, no mastoid effusion; no obvious large retrocochlear lesion noted 10/27/2022 CT Face reviewed with attention to ears and sinuses: no significant paranasal sinus disease; mastoids and ME well aerated; no significant OC or otic capsule abnormalitites although cuts thick so not optimal PMH/Meds/All/SocHx/FamHx/ROS:   Past Medical History:  Diagnosis Date   Acute bursitis of right shoulder 08/29/2018   Right shoulder injected in August 29, 2018   Allergic rhinitis    Closed displaced fracture of fifth metatarsal bone of left foot 03/16/2017   DEPRESSIVE DISORDER NOT  ELSEWHERE CLASSIFIED 03/27/2010   Qualifier: Diagnosis of  By: [name], [name]   Mr Christine Hoof, NP , [facility] [place]     Diabetes (HCC) 04/02/2016   Diabetes mellitus    Essential hypertension 03/14/2008   Qualifier: Diagnosis of  By: [name], [name]     Fibromyalgia 08/12/2011   Diagnosed by [name] , Rheumatologist 2010    GERD (gastroesophageal reflux disease) 12/25/2016   Gluteal tendonitis of right buttock 10/19/2017   Injected in October 19, 2017 Injected October 07, 2018   Greater trochanteric bursitis of left hip 06/17/2016   Greater trochanteric bursitis of right hip 11/27/2015   Injected 11/27/2015    History of recurrent UTIs    [name]   HTN (hypertension)    Hx of osteopenia 10/11/2012   [facility]EXA; ordered by [name] Lowest T score: -2.1 @ lumbar spine @ [facility] 11/01/12; decrease of 8.5% vs 05/2009. There is 9.2% risk over 10 yrs History fracture left elbow @ 4 Fractured left wrist , right elbow and nose post fall July/18/2013. Dr. [name] No FH Osteoporosis No PMH of bisphosphonate therapy (generic Fosamax  Rxed but not filled  due to polypharmacy )    Hyperlipidemia    Hyperlipidemia associated with type 2 diabetes mellitus (HCC) 01/18/2007   Qualifier: Diagnosis of  By: [name]  With DM LDL goal = < 100, ideally < 70. Mi in father @ 65; 2 brothers @ 50 & 64     Lumbar radiculopathy 04/11/2008   Qualifier: Diagnosis of  By: [name], [name] Dock well to epidural 01/12/2017  repeat epidural given May 2018   Migraine headache    quiescent   Nonallopathic lesion of cervical region 07/19/2014   Nonallopathic lesion of lumbosacral region 01/15/2016   Nonallopathic lesion of sacral region 06/27/2018   Nonallopathic lesion-rib cage 09/26/2014   Palpitations 04/15/2011   Rotator cuff impingement syndrome of left shoulder 05/11/2014   Seasonal allergic rhinitis 03/21/2012   Sinusitis, chronic 04/20/2016   Sleep disorder    Dr Dohmier   Subluxation of peroneal tendon of right foot 11/10/2017   TIA (transient ischemic attack)    TRANSIENT  ISCHEMIC ATTACKS, HX OF 02/02/2008   Qualifier: Diagnosis of  By: Christine [name] GUSS Heron SERITA ADVRS EFF UNS RX MEDICINAL&BIOLOGICAL [facility] 02/02/2008   Qualifier: Diagnosis of  By: [name], [name] [name] AND MYOSITIS 02/02/2008   Qualifier: Diagnosis of  By: [name], [name]     UTI (urinary tract infection) 10/05/2014   Viral upper respiratory tract infection with cough 12/31/2014   Vitamin D  deficiency 05/25/2008   Qualifier: Diagnosis of  By: [name], [name]       Past Surgical History:  Procedure Laterality Date   COLONOSCOPY     Dr Avram; hemorrhoids   CORONARY ANGIOPLASTY  12/15/1995   for chest pain- negative    FOOT MASS EXCISION Left 04/08/2023   KNEE ARTHROSCOPY Left 2023   POLYPECTOMY  12/14/2000   benign, hyperplastic polyp; rectal bleeding 2008   TONSILLECTOMY     TOTAL ABDOMINAL HYSTERECTOMY  12/14/1981   BSO for endometriosis   WISDOM TOOTH EXTRACTION      Family History  Problem Relation Age of Onset   Colon cancer Maternal Aunt    Diabetes Paternal Uncle    Heart attack Father 39   COPD Mother    Lung cancer Brother  smoker   Mental illness Other        niece, committed suicide.   Heart attack Other        brother X 2; @ 50 & 52   Stroke Neg Hx      Social Connections: Moderately Integrated (08/28/2023)   Social Connection and Isolation Panel [NHANES]    Frequency of Communication with Friends and Family: Twice a week    Frequency of Social Gatherings with Friends and Family: Once a week    Attends Religious Services: More than 4 times per year    Active Member of [employer] or Organizations: No    Attends Banker Meetings: Never    Marital Status: Married      Current Outpatient Medications:    aspirin  81 MG tablet, Take 81 mg by mouth daily., Disp: , Rfl:    atorvastatin  (LIPITOR) 40 MG tablet, TAKE 1 TABLET BY MOUTH EVERY DAY, Disp: 90 tablet, Rfl: 3   azelastine  (ASTELIN ) 0.1 % nasal spray, Place 2 sprays  into both nostrils 2 (two) times daily. Use in each nostril as directed, Disp: 30 mL, Rfl: 12   cephALEXin  (KEFLEX ) 250 MG capsule, TAKE 1 CAPSULE BY MOUTH DAILY., Disp: 90 capsule, Rfl: 1   DULoxetine  (CYMBALTA ) 60 MG capsule, Take 60 mg by mouth daily. Taking 90 mg daily, Disp: , Rfl:    fluticasone  (FLONASE ) 50 MCG/ACT nasal spray, Place 2 sprays into both nostrils in the morning and at bedtime., Disp: 16 g, Rfl: 6   glucose blood (ONETOUCH ULTRA) test strip, 1 each by Other route daily at 12 noon. Use as instructed, Disp: 100 strip, Rfl: 3   Inclisiran Sodium  (LEQVIO  [place]), Inject into the skin., Disp: , Rfl:    Lancets (ONETOUCH ULTRASOFT) lancets, Check blood sugar once daily. Dx code: 250.00, Disp: 100 each, Rfl: 12   LORazepam  (ATIVAN ) 0.5 MG tablet, Take 0.5 mg by mouth as needed for anxiety., Disp: , Rfl:    metoprolol  succinate (TOPROL -XL) 25 MG 24 hr tablet, Take 0.5 tablets (12.5 mg total) by mouth daily., Disp: 45 tablet, Rfl: 1   omeprazole  (PRILOSEC) 20 MG capsule, TAKE 1 CAPSULE BY MOUTH EVERY DAY, Disp: 90 capsule, Rfl: 3   Semaglutide , 2 MG/DOSE, (OZEMPIC , 2 MG/DOSE,) 8 MG/3ML SOPN, Inject 2 mg into the skin once a week. Gets from pt assist, Disp: 2 mL, Rfl: 0   tiZANidine  (ZANAFLEX ) 4 MG tablet, Take 1 tablet (4 mg total) by mouth at bedtime., Disp: 90 tablet, Rfl: 1   vitamin B-12 (CYANOCOBALAMIN ) 1000 MCG tablet, Take 1,000 mcg by mouth daily., Disp: , Rfl:    Vitamin D , Ergocalciferol , (DRISDOL ) 1.25 MG (50000 UNIT) CAPS capsule, TAKE 1 CAPSULE BY MOUTH ONE TIME PER WEEK, Disp: 12 capsule, Rfl: 0   acetaminophen  (TYLENOL ) 500 MG tablet, Take 500 mg by mouth as needed. (Patient not taking: Reported on 01/18/2024), Disp: , Rfl:    traZODone  (DESYREL ) 50 MG tablet, TAKE 1/2 TO 1 TABLET BY MOUTH AT BEDTIME AS NEEDED FOR SLEEP (Patient not taking: Reported on 01/18/2024), Disp: 90 tablet, Rfl: 1   Physical Exam:   BP (!) 141/83 (BP Location: Right Arm, Patient Position: Sitting)    Pulse 81   Ht 5' 6 (1.676 m)   Wt 135 lb (61.2 kg)   LMP  (LMP Unknown)   SpO2 96%   BMI 21.79 kg/m   Salient findings:  CN II-XII intact  Bilateral EAC clear and TM intact with well pneumatized middle  ear spaces Head shake neg Gait generally normal and not broad based Dix-Hallpike neg Anterior rhinoscopy: Septum relatively midline; bilateral inferior turbinates without significant hypertrophy; Nasal endoscopy was indicated to better evaluate the nose and paranasal sinuses, given the patient's history and exam findings, and is detailed below. No lesions of oral cavity/oropharynx No obviously palpable neck masses/lymphadenopathy/thyromegaly No respiratory distress or stridor  Seprately Identifiable Procedures:  PROCEDURE: Bilateral Diagnostic Rigid Nasal Endoscopy Pre-procedure diagnosis: Concern for sinusitis contributing to headache Post-procedure diagnosis: same Indication: See pre-procedure diagnosis and physical exam above Complications: None apparent EBL: 0 mL Anesthesia: Lidocaine  4% and topical decongestant was topically sprayed in each nasal cavity  Description of Procedure:  Patient was identified. A rigid 30 degree endoscope was utilized to evaluate the sinonasal cavities, mucosa, sinus ostia and turbinates and septum.  Overall, signs of mucosal inflammation are not noted.  No mucopurulence, polyps, or masses noted.   Right Middle meatus: clear Right SE Recess: clear Left MM: clear Left SE Recess: clear    Photodocumentation was obtained. CPT CODE -- 68768 - Mod 25   Impression & Plans:  Christine [name] is a 73 y.o. female with:  1. Post-nasal drip   2. Dysfunction of both eustachian tubes   3. Sensation of fullness in both ears   4. Migraine without aura and without status migrainosus, not intractable   5. Facial pressure   6. Imbalance    Multiple complaints today: Sinuses/facial pain: does not appear sinus related given multiple clear MRI and CT during  sx. Suspect given history of migraines, may be atypical facial pain/migraine ETD and post nasal drip: ear symptoms and PND could be related to ETD and we [name] start flonase  and astelin  for this and see if she improves; d/w PT audio, but decl Imbalance: no frank vertigo and appears to be only when she is rising from her chair; we discussed no other symptoms beside fullness and no other Meniere's sx. MRI without retrocochlear lesion Near syncope would also be unusual. Vest testing did not help and noted some prior VOR deficits(?); as such, would advise neuro re-eval and cardiac workup - orthostatic hypotension also likely   D/w pt f/u and she'd like to do PRN for now  See below regarding exact medications prescribed this encounter including dosages and route: Meds ordered this encounter  Medications   fluticasone  (FLONASE ) 50 MCG/ACT nasal spray    Sig: Place 2 sprays into both nostrils in the morning and at bedtime.    Dispense:  16 g    Refill:  6   azelastine  (ASTELIN ) 0.1 % nasal spray    Sig: Place 2 sprays into both nostrils 2 (two) times daily. Use in each nostril as directed    Dispense:  30 mL    Refill:  12      Thank you for allowing me the opportunity to care for your patient. Please do not hesitate to contact me should you have any other questions.  Sincerely, Christine Blanch, MD Otolaryngologist (ENT), [facility] Phone: [phone] Fax: [phone]  01/18/2024, 2:24 PM   MDM:  Level 4 - [phone] Complexity/Problems addressed: mod - multiple chronic problems Data complexity: mod - independent review of notes, labs; independent interpretation of CT and MRI imaging - Morbidity: mod  - Prescription Drug prescribed or managed: yes

## 2024-01-20 ENCOUNTER — Encounter: Payer: Self-pay | Admitting: Family Medicine

## 2024-01-27 ENCOUNTER — Ambulatory Visit
Admission: RE | Admit: 2024-01-27 | Discharge: 2024-01-27 | Disposition: A | Discharge status: Home / Self Care | Payer: Medicare Other | Source: Ambulatory Visit | Attending: Family Medicine | Admitting: Family Medicine

## 2024-01-27 DIAGNOSIS — Z1231 Encounter for screening mammogram for malignant neoplasm of breast: Secondary | ICD-10-CM | POA: Diagnosis not present

## 2024-02-01 ENCOUNTER — Encounter: Payer: Self-pay | Admitting: Family Medicine

## 2024-02-01 ENCOUNTER — Other Ambulatory Visit: Payer: Self-pay | Admitting: Family Medicine

## 2024-02-09 ENCOUNTER — Telehealth: Payer: Self-pay | Admitting: *Deleted

## 2024-02-09 MED ORDER — OZEMPIC (2 MG/DOSE) 8 MG/3ML ~~LOC~~ SOPN: 2.0000 mg | PEN_INJECTOR | SUBCUTANEOUS | 0 refills | Status: DC

## 2024-02-09 NOTE — Progress Notes (Deleted)
 [name] [address] [place] [address] Phone: [phone] Subjective:    I'm seeing this patient by the request  of:  Jeani Sow, MD  CC: Back and neck pain follow-up  [id number]  Christine [name] is a 73 y.o. female coming in with complaint of back and neck pain. OMT 01/06/2024. Patient states   Medications patient has been prescribed: Vit D, Zanaflex  Taking:         Reviewed prior external information including notes and imaging from previsou exam, outside providers and external EMR if available.   As well as notes that were available from care everywhere and other healthcare systems.  Past medical history, social, surgical and family history all reviewed in electronic medical record.  No pertanent information unless stated regarding to the chief complaint.   Past Medical History:  Diagnosis Date   Acute bursitis of right shoulder 08/29/2018   Right shoulder injected in August 29, 2018   Allergic rhinitis    Closed displaced fracture of fifth metatarsal bone of left foot 03/16/2017   DEPRESSIVE DISORDER NOT ELSEWHERE CLASSIFIED 03/27/2010   Qualifier: Diagnosis of  By: Alwyn Ren MD, Chrissie Noa   Mr Skeet Latch, NP , [facility] [place]     Diabetes (HCC) 04/02/2016   Diabetes mellitus    Essential hypertension 03/14/2008   Qualifier: Diagnosis of  By: Alwyn Ren MD, [name]     Fibromyalgia 08/12/2011   Diagnosed by Dr Jimmy Footman , Rheumatologist 2010    GERD (gastroesophageal reflux disease) 12/25/2016   Gluteal tendonitis of right buttock 10/19/2017   Injected in October 19, 2017 Injected October 07, 2018   Greater trochanteric bursitis of left hip 06/17/2016   Greater trochanteric bursitis of right hip 11/27/2015   Injected 11/27/2015    History of recurrent UTIs    [name]   HTN (hypertension)    Hx of osteopenia 10/11/2012   [facility] DEXA; ordered by Dr Alwyn Ren Lowest T score: -2.1 @ lumbar spine @ [facility]  11/01/12; decrease of 8.5% vs 05/2009. There is 9.2% risk over 10 yrs History fracture left elbow @ 6 Fractured left wrist , right elbow and nose post fall July/18/2013. Dr. Melvyn Novas No FH Osteoporosis No PMH of bisphosphonate therapy (generic Fosamax Rxed but not filled  due to polypharmacy )    Hyperlipidemia    Hyperlipidemia associated with type 2 diabetes mellitus (HCC) 01/18/2007   Qualifier: Diagnosis of  By: Daine Gip  With DM LDL goal = < 100, ideally < 70. Mi in father @ 56; 2 brothers @ 38 & 19     Lumbar radiculopathy 04/11/2008   Qualifier: Diagnosis of  By: Alwyn Ren MD, Christiane Ha well to epidural 01/12/2017  repeat epidural given May 2018   Migraine headache    quiescent   Nonallopathic lesion of cervical region 07/19/2014   Nonallopathic lesion of lumbosacral region 01/15/2016   Nonallopathic lesion of sacral region 06/27/2018   Nonallopathic lesion-rib cage 09/26/2014   Palpitations 04/15/2011   Rotator cuff impingement syndrome of left shoulder 05/11/2014   Seasonal allergic rhinitis 03/21/2012   Sinusitis, chronic 04/20/2016   Sleep disorder    Dr Dohmier   Subluxation of peroneal tendon of right foot 11/10/2017   TIA (transient ischemic attack)    TRANSIENT ISCHEMIC ATTACKS, HX OF 02/02/2008   Qualifier: Diagnosis of  By: Magda Bernheim ADVRS EFF UNS RX MEDICINAL&BIOLOGICAL [facility] 02/02/2008   Qualifier: Diagnosis  of  By: Alwyn Ren MD, Myer Peer AND MYOSITIS 02/02/2008   Qualifier: Diagnosis of  By: Alwyn Ren MD, Chrissie Noa     UTI (urinary tract infection) 10/05/2014   Viral upper respiratory tract infection with cough 12/31/2014   Vitamin D deficiency 05/25/2008   Qualifier: Diagnosis of  By: Alwyn Ren MD, Chrissie Noa      Allergies  Allergen Reactions   Vioxx [Rofecoxib] Other (See Comments)    Excess BP which caused TIA    Prednisone Other (See Comments)    Mental status changes with high-dose oral agent. Tolerates epidural steroid injections. No  associated rash or fever   Macrodantin [Nitrofurantoin Macrocrystal] Hives   Sulfa Antibiotics     Hives   Zetia [Ezetimibe]     myalgias   Colesevelam Other (See Comments)    Leg Pain     Review of Systems:  No headache, visual changes, nausea, vomiting, diarrhea, constipation, dizziness, abdominal pain, skin rash, fevers, chills, night sweats, weight loss, swollen lymph nodes, body aches, joint swelling, chest pain, shortness of breath, mood changes. POSITIVE muscle aches  Objective  There were no vitals taken for this visit.   General: No apparent distress alert and oriented x3 mood and affect normal, dressed appropriately.  HEENT: Pupils equal, extraocular movements intact  Respiratory: Patient's speak in full sentences and does not appear short of breath  Cardiovascular: No lower extremity edema, non tender, no erythema  Gait MSK:  Back   Osteopathic findings  C2 flexed rotated and side bent right C6 flexed rotated and side bent left T3 extended rotated and side bent right inhaled rib T9 extended rotated and side bent left L2 flexed rotated and side bent right Sacrum right on right     Assessment and Plan:  No problem-specific Assessment & Plan notes found for this encounter.    Nonallopathic problems  Decision today to treat with OMT was based on Physical Exam  After verbal consent patient was treated with HVLA, ME, FPR techniques in cervical, rib, thoracic, lumbar, and sacral  areas  Patient tolerated the procedure well with improvement in symptoms  Patient given exercises, stretches and lifestyle modifications  See medications in patient instructions if given  Patient will follow up in 4-8 weeks     The above documentation has been reviewed and is accurate and complete Judi Saa, DO         Note: This dictation was prepared with Dragon dictation along with smaller phrase technology. Any transcriptional errors that result from this process  are unintentional.

## 2024-02-09 NOTE — Telephone Encounter (Signed)
 Copied from CRM [id number]. Topic: Clinical - Prescription Issue >> Feb 09, 2024  1:33 PM Corin V wrote: Reason for CRM: Patient stated that the patient assistance program is delayed and will not be abel to provide her with her next Ozempic injection before it's due. They are sending her a voucher, but need the Ozempic script sent to the [id number] - [place], [place] - [address] before the voucher can be sent to her.  Rx sent to the pharmacy.

## 2024-02-10 ENCOUNTER — Ambulatory Visit: Payer: Medicare Other | Admitting: Family Medicine

## 2024-02-14 NOTE — Progress Notes (Unsigned)
 [name] [address] [place] [address] Phone: [phone] Subjective:   Bruce Donath, am serving as a scribe for Dr. Antoine Primas.  I'm seeing this patient by the request  of:  Jeani Sow, MD  CC: back and neck pain  [id number]  Christine [name] is a 73 y.o. female coming in with complaint of back and neck pain. OMT 01/06/2024. Patient states that she is doing ok when seated. If she stands she will have some thoracic and lumbar spine pain. Cspine will get caught in flexion intermittently. Has not had catching in a couple of months. Just occurred in waiting room.   Was vestibular PT was not helpful. Has been dealing with orthostatic hypertension. Also notes having a tight jaw in both TMJs.   Medications patient has been prescribed: Zanaflex  Taking:         Reviewed prior external information including notes and imaging from previsou exam, outside providers and external EMR if available.   As well as notes that were available from care everywhere and other healthcare systems.  Past medical history, social, surgical and family history all reviewed in electronic medical record.  No pertanent information unless stated regarding to the chief complaint.   Past Medical History:  Diagnosis Date   Acute bursitis of right shoulder 08/29/2018   Right shoulder injected in August 29, 2018   Allergic rhinitis    Closed displaced fracture of fifth metatarsal bone of left foot 03/16/2017   DEPRESSIVE DISORDER NOT ELSEWHERE CLASSIFIED 03/27/2010   Qualifier: Diagnosis of  By: Alwyn Ren MD, Chrissie Noa   Mr Skeet Latch, NP , [facility] [place] [place]     Diabetes (HCC) 04/02/2016   Diabetes mellitus    Essential hypertension 03/14/2008   Qualifier: Diagnosis of  By: Alwyn Ren MD, [name]     Fibromyalgia 08/12/2011   Diagnosed by Dr Jimmy Footman , Rheumatologist 2010    GERD (gastroesophageal reflux disease) 12/25/2016   Gluteal tendonitis  of right buttock 10/19/2017   Injected in October 19, 2017 Injected October 07, 2018   Greater trochanteric bursitis of left hip 06/17/2016   Greater trochanteric bursitis of right hip 11/27/2015   Injected 11/27/2015    History of recurrent UTIs    [name]   HTN (hypertension)    Hx of osteopenia 10/11/2012   [facility] DEXA; ordered by Dr Alwyn Ren Lowest T score: -2.1 @ lumbar spine @ [facility] 11/01/12; decrease of 8.5% vs 05/2009. There is 9.2% risk over 10 yrs History fracture left elbow @ 4 Fractured left wrist , right elbow and nose post fall July/18/2013. Dr. Melvyn Novas No FH Osteoporosis No PMH of bisphosphonate therapy (generic Fosamax Rxed but not filled  due to polypharmacy )    Hyperlipidemia    Hyperlipidemia associated with type 2 diabetes mellitus (HCC) 01/18/2007   Qualifier: Diagnosis of  By: Daine Gip  With DM LDL goal = < 100, ideally < 70. Mi in father @ 36; 2 brothers @ 84 & 36     Lumbar radiculopathy 04/11/2008   Qualifier: Diagnosis of  By: Alwyn Ren MD, Christiane Ha well to epidural 01/12/2017  repeat epidural given May 2018   Migraine headache    quiescent   Nonallopathic lesion of cervical region 07/19/2014   Nonallopathic lesion of lumbosacral region 01/15/2016   Nonallopathic lesion of sacral region 06/27/2018   Nonallopathic lesion-rib cage 09/26/2014   Palpitations 04/15/2011   Rotator cuff impingement syndrome of left shoulder  05/11/2014   Seasonal allergic rhinitis 03/21/2012   Sinusitis, chronic 04/20/2016   Sleep disorder    Dr Dohmier   Subluxation of peroneal tendon of right foot 11/10/2017   TIA (transient ischemic attack)    TRANSIENT ISCHEMIC ATTACKS, HX OF 02/02/2008   Qualifier: Diagnosis of  By: Magda Bernheim ADVRS EFF UNS RX MEDICINAL&BIOLOGICAL SBSTNC 02/02/2008   Qualifier: Diagnosis of  By: Alwyn Ren MD, Myer Peer AND MYOSITIS 02/02/2008   Qualifier: Diagnosis of  By: Alwyn Ren MD, Chrissie Noa     UTI (urinary tract infection)  10/05/2014   Viral upper respiratory tract infection with cough 12/31/2014   Vitamin D deficiency 05/25/2008   Qualifier: Diagnosis of  By: Alwyn Ren MD, Chrissie Noa      Allergies  Allergen Reactions   Vioxx [Rofecoxib] Other (See Comments)    Excess BP which caused TIA    Prednisone Other (See Comments)    Mental status changes with high-dose oral agent. Tolerates epidural steroid injections. No associated rash or fever   Macrodantin [Nitrofurantoin Macrocrystal] Hives   Sulfa Antibiotics     Hives   Zetia [Ezetimibe]     myalgias   Colesevelam Other (See Comments)    Leg Pain     Review of Systems:  No  visual changes, nausea, vomiting, diarrhea, constipation,  abdominal pain, skin rash, fevers, chills, night sweats, weight loss, swollen lymph nodes, body aches, joint swelling, chest pain, shortness of breath, mood changes. POSITIVE muscle aches, worsening headaches and dizziness  Objective  Blood pressure 122/82, pulse 61, height 5\' 6"  (1.676 m), weight 133 lb (60.3 kg), SpO2 96%.   General: No apparent distress alert and oriented x3 mood and affect normal, dressed appropriately.  HEENT: Pupils equal, extraocular movements intact  Respiratory: Patient's speak in full sentences and does not appear short of breath  Cardiovascular: No lower extremity edema, non tender, no erythema  Gait normal  MSK:  Back ttp diffusely. Tightness of the neck noted .  Patient does have worsening pain with extension of the neck.  Worsening of dizziness with any type of change in position today.  Blood pressure though stays stable during the whole time and heart rate stays stable.   Osteopathic findings  C2 flexed rotated and side bent right C5 flexed rotated and side bent left T3 extended rotated and side bent right inhaled rib T9 extended rotated and side bent left L2 flexed rotated and side bent right L3 flexed rotated sidebent left Sacrum right on right    Assessment and  Plan:  Dizziness Worsening dizziness again.  Would like MRI with and without contrast of the cervical spine that would potentially help with brain.  We discussed with worsening symptoms.  Probable vascular in nature.  Discussed HEP  Worsening symptoms seek medical attention     Nonallopathic problems  Decision today to treat with OMT was based on Physical Exam  After verbal consent patient was treated with HVLA, ME, FPR techniques in cervical, rib, thoracic, lumbar, and sacral  areas, avoided HVLA on the neck   Patient tolerated the procedure well with improvement in symptoms  Patient given exercises, stretches and lifestyle modifications  See medications in patient instructions if given  Patient will follow up in 4-8 weeks    The above documentation has been reviewed and is accurate and complete Judi Saa, DO          Note: This dictation was prepared with Reubin Milan  dictation along with smaller phrase technology. Any transcriptional errors that result from this process are unintentional.

## 2024-02-15 ENCOUNTER — Encounter: Payer: Self-pay | Admitting: Family Medicine

## 2024-02-15 ENCOUNTER — Ambulatory Visit: Payer: Medicare Other | Admitting: Family Medicine

## 2024-02-15 VITALS — BP 122/82 | HR 61 | Ht 66.0 in | Wt 133.0 lb

## 2024-02-15 DIAGNOSIS — M542 Cervicalgia: Secondary | ICD-10-CM

## 2024-02-15 DIAGNOSIS — M5010 Cervical disc disorder with radiculopathy, unspecified cervical region: Secondary | ICD-10-CM

## 2024-02-15 DIAGNOSIS — R42 Dizziness and giddiness: Secondary | ICD-10-CM | POA: Diagnosis not present

## 2024-02-15 DIAGNOSIS — M9908 Segmental and somatic dysfunction of rib cage: Secondary | ICD-10-CM | POA: Diagnosis not present

## 2024-02-15 DIAGNOSIS — M2550 Pain in unspecified joint: Secondary | ICD-10-CM

## 2024-02-15 DIAGNOSIS — M9903 Segmental and somatic dysfunction of lumbar region: Secondary | ICD-10-CM | POA: Diagnosis not present

## 2024-02-15 DIAGNOSIS — M9901 Segmental and somatic dysfunction of cervical region: Secondary | ICD-10-CM | POA: Diagnosis not present

## 2024-02-15 DIAGNOSIS — M791 Myalgia, unspecified site: Secondary | ICD-10-CM | POA: Diagnosis not present

## 2024-02-15 DIAGNOSIS — E559 Vitamin D deficiency, unspecified: Secondary | ICD-10-CM | POA: Diagnosis not present

## 2024-02-15 DIAGNOSIS — M9904 Segmental and somatic dysfunction of sacral region: Secondary | ICD-10-CM

## 2024-02-15 DIAGNOSIS — M9902 Segmental and somatic dysfunction of thoracic region: Secondary | ICD-10-CM

## 2024-02-15 LAB — COMPREHENSIVE METABOLIC PANEL
ALT: 26 U/L (ref 0–35)
AST: 20 U/L (ref 0–37)
Albumin: 4.3 g/dL (ref 3.5–5.2)
Alkaline Phosphatase: 64 U/L (ref 39–117)
BUN: 20 mg/dL (ref 6–23)
CO2: 29 meq/L (ref 19–32)
Calcium: 9.5 mg/dL (ref 8.4–10.5)
Chloride: 99 meq/L (ref 96–112)
Creatinine, Ser: 0.83 mg/dL (ref 0.40–1.20)
GFR: 69.31 mL/min (ref >60.00)
Glucose, Bld: 142 mg/dL — ABNORMAL HIGH (ref 70–99)
Potassium: 3.7 meq/L (ref 3.5–5.1)
Sodium: 135 meq/L (ref 135–145)
Total Bilirubin: 0.5 mg/dL (ref 0.2–1.2)
Total Protein: 6.8 g/dL (ref 6.0–8.3)

## 2024-02-15 LAB — CBC WITH DIFFERENTIAL/PLATELET
Basophils Absolute: 0 10*3/uL (ref 0.0–0.1)
Basophils Relative: 0.9 % (ref 0.0–3.0)
Eosinophils Absolute: 0.1 10*3/uL (ref 0.0–0.7)
Eosinophils Relative: 2.1 % (ref 0.0–5.0)
HCT: 40.1 % (ref 36.0–46.0)
Hemoglobin: 13.5 g/dL (ref 12.0–15.0)
Lymphocytes Relative: 35.2 % (ref 12.0–46.0)
Lymphs Abs: 1.7 10*3/uL (ref 0.7–4.0)
MCHC: 33.8 g/dL (ref 30.0–36.0)
MCV: 90.6 fl (ref 78.0–100.0)
Monocytes Absolute: 0.4 10*3/uL (ref 0.1–1.0)
Monocytes Relative: 8.5 % (ref 3.0–12.0)
Neutro Abs: 2.6 10*3/uL (ref 1.4–7.7)
Neutrophils Relative %: 53.3 % (ref 43.0–77.0)
Platelets: 260 10*3/uL (ref 150.0–400.0)
RBC: 4.43 Mil/uL (ref 3.87–5.11)
RDW: 13.3 % (ref 11.5–15.5)
WBC: 4.8 10*3/uL (ref 4.0–10.5)

## 2024-02-15 LAB — SEDIMENTATION RATE: Sed Rate: 6 mm/h (ref 0–30)

## 2024-02-15 LAB — TSH: TSH: 1.47 u[IU]/mL (ref 0.35–5.50)

## 2024-02-15 LAB — VITAMIN B12: Vitamin B-12: 732 pg/mL (ref 211–911)

## 2024-02-15 LAB — VITAMIN D 25 HYDROXY (VIT D DEFICIENCY, FRACTURES): VITD: 50.52 ng/mL (ref 30.00–100.00)

## 2024-02-15 NOTE — Assessment & Plan Note (Signed)
 Worsening dizziness again.  Would like MRI with and without contrast of the cervical spine that would potentially help with brain.  We discussed with worsening symptoms.  Probable vascular in nature.  Discussed HEP  Worsening symptoms seek medical attention

## 2024-02-15 NOTE — Patient Instructions (Signed)
 Labs today [facility] [phone] Call Today  When we receive your results we will contact you. See you again in 6 weeks

## 2024-02-16 NOTE — Addendum Note (Signed)
 Addended by: Evon Slack on: 02/16/2024 11:38 AM   Modules accepted: Orders

## 2024-02-24 ENCOUNTER — Encounter: Payer: Self-pay | Admitting: Family Medicine

## 2024-02-28 ENCOUNTER — Encounter: Payer: Self-pay | Admitting: Family Medicine

## 2024-03-05 ENCOUNTER — Other Ambulatory Visit: Payer: Self-pay | Admitting: Family Medicine

## 2024-03-06 NOTE — Telephone Encounter (Signed)
 Patient has picked up medication from office on Friday. She stated that they had to send her an extra one due to how far they were behind. Patient has already received it.

## 2024-03-07 ENCOUNTER — Ambulatory Visit
Admission: RE | Admit: 2024-03-07 | Discharge: 2024-03-07 | Disposition: A | Discharge status: Home / Self Care | Source: Ambulatory Visit | Attending: Family Medicine | Admitting: Family Medicine

## 2024-03-07 ENCOUNTER — Other Ambulatory Visit

## 2024-03-07 DIAGNOSIS — M9901 Segmental and somatic dysfunction of cervical region: Secondary | ICD-10-CM

## 2024-03-07 DIAGNOSIS — M542 Cervicalgia: Secondary | ICD-10-CM

## 2024-03-07 DIAGNOSIS — R42 Dizziness and giddiness: Secondary | ICD-10-CM

## 2024-03-07 DIAGNOSIS — M5010 Cervical disc disorder with radiculopathy, unspecified cervical region: Secondary | ICD-10-CM

## 2024-03-07 DIAGNOSIS — M4802 Spinal stenosis, cervical region: Secondary | ICD-10-CM | POA: Diagnosis not present

## 2024-03-07 DIAGNOSIS — M47812 Spondylosis without myelopathy or radiculopathy, cervical region: Secondary | ICD-10-CM | POA: Diagnosis not present

## 2024-03-07 DIAGNOSIS — I6782 Cerebral ischemia: Secondary | ICD-10-CM | POA: Diagnosis not present

## 2024-03-07 MED ORDER — GADOPICLENOL 0.5 MMOL/ML IV SOLN
6.0000 mL | Freq: Once | INTRAVENOUS | Status: AC | PRN
Start: 2024-03-07 — End: 2024-03-07
  Administered 2024-03-07: 6 mL via INTRAVENOUS

## 2024-03-10 DIAGNOSIS — F339 Major depressive disorder, recurrent, unspecified: Secondary | ICD-10-CM | POA: Diagnosis not present

## 2024-03-10 DIAGNOSIS — F410 Panic disorder [episodic paroxysmal anxiety] without agoraphobia: Secondary | ICD-10-CM | POA: Diagnosis not present

## 2024-03-18 ENCOUNTER — Other Ambulatory Visit: Payer: Self-pay | Admitting: Family Medicine

## 2024-03-28 ENCOUNTER — Encounter: Payer: Self-pay | Admitting: Family Medicine

## 2024-03-29 ENCOUNTER — Encounter: Payer: Self-pay | Admitting: Family Medicine

## 2024-03-29 ENCOUNTER — Other Ambulatory Visit: Payer: Self-pay

## 2024-03-29 DIAGNOSIS — M2550 Pain in unspecified joint: Secondary | ICD-10-CM

## 2024-03-29 DIAGNOSIS — D369 Benign neoplasm, unspecified site: Secondary | ICD-10-CM

## 2024-03-29 NOTE — Progress Notes (Signed)
 Labs ordered.

## 2024-03-30 ENCOUNTER — Other Ambulatory Visit (INDEPENDENT_AMBULATORY_CARE_PROVIDER_SITE_OTHER)

## 2024-03-30 ENCOUNTER — Encounter: Payer: Self-pay | Admitting: Family Medicine

## 2024-03-30 ENCOUNTER — Ambulatory Visit (INDEPENDENT_AMBULATORY_CARE_PROVIDER_SITE_OTHER): Payer: Medicare Other | Admitting: Family Medicine

## 2024-03-30 VITALS — BP 132/78 | HR 81 | Temp 97.7°F | Ht 66.0 in | Wt 133.3 lb

## 2024-03-30 DIAGNOSIS — I10 Essential (primary) hypertension: Secondary | ICD-10-CM | POA: Diagnosis not present

## 2024-03-30 DIAGNOSIS — N6321 Unspecified lump in the left breast, upper outer quadrant: Secondary | ICD-10-CM | POA: Diagnosis not present

## 2024-03-30 DIAGNOSIS — M2550 Pain in unspecified joint: Secondary | ICD-10-CM | POA: Diagnosis not present

## 2024-03-30 DIAGNOSIS — R42 Dizziness and giddiness: Secondary | ICD-10-CM | POA: Diagnosis not present

## 2024-03-30 DIAGNOSIS — Z7985 Long-term (current) use of injectable non-insulin antidiabetic drugs: Secondary | ICD-10-CM | POA: Diagnosis not present

## 2024-03-30 DIAGNOSIS — E1165 Type 2 diabetes mellitus with hyperglycemia: Secondary | ICD-10-CM

## 2024-03-30 LAB — POCT GLYCOSYLATED HEMOGLOBIN (HGB A1C): Hemoglobin A1C: 7.3 % — AB (ref 4.0–5.6)

## 2024-03-30 LAB — LUTEINIZING HORMONE: LH: 53.52 m[IU]/mL

## 2024-03-30 LAB — FOLLICLE STIMULATING HORMONE: FSH: 103 m[IU]/mL

## 2024-03-30 NOTE — Patient Instructions (Addendum)
 Magnesium glycinate 250mg  or so

## 2024-03-30 NOTE — Progress Notes (Signed)
 Subjective:     Patient ID: Christine [name], female    DOB: Apr 05, 1951, 73 y.o.   MRN: [id number]  Chief Complaint  Patient presents with   Diabetes    Diabetes    DM, type 2 - on ozempic 2mg -taking weekly as having hard time tracking every other week.    Concerned about wt, but concerned about stopping and regaining and sugars going out of control-she will try to re-apply.   Sugars 104-150  Migraines - Pt reports her migraines have overall improved. She tends to not need to take any medication to manage her headaches, but at times will take a Tylenol. Crissie Dome takes tramadol  will see Dr. Lydia Sams next month(ENT)-tinnitis, R side of face "stopped up" freq  Dizziness / Tachycardia - She reports episodes of dizziness that tend to occur when standing up after sitting for long periods or with quick positional changes.. She has been monitoring her pulse and on average her pulse is 90 bpm. Highest reading was 116, on a day she had an episode of dizziness. She also reports hips and ears feel flushed and hot with positional changes. States her ears don't turn red, just feel hot.  Tizanidine seems to trigger as well.   Elevated BP  -  doing better  She has been taking B12 and vitamin D supplementation once weekly.  Discussed the use of AI scribe software for clinical note transcription with the patient, who gave verbal consent to proceed.  History of Present Illness Christine [name] is a 73 year old female with diabetes and a recent diagnosis of pituitary adenoma who presents with dizziness and headaches.  She experiences dizziness and headaches, which she attributes to her recent diagnosis of a small pituitary adenoma. The headaches are described as dull aches occurring in various parts of her head, sometimes accompanied by 'fragmented vision' and 'brain fog.' She has had two severe migraines in the past two weeks. Dizziness and nausea occur even without positional changes, and she sometimes  needs to sit down to prevent fainting-chronic.  Her blood sugar levels are slightly elevated, with the highest morning reading being 155 mg/dL, though they are usually in the 120s or below. She is currently taking Ozempic 2 mg weekly for diabetes, which she receives through patient assistance. Her diet varies and sometimes includes a small cup of ice cream if she skips a big supper, which she believes may contribute to higher sugar levels the following day. Her A1c was recently measured at 7.3, consistent with previous results. She is trying to manage her diet despite difficulties with cooking due to dizziness.  She has a history of low blood pressure and takes metoprolol, though she had not taken it on the day of the visit. Her blood pressure is generally normal at home, but she experiences dizziness and weakness, which she attributes to the pituitary adenoma. Waves of nausea and dizziness are also noted, which she believes may be related to the adenoma.  She has a history of sinus infections, though recent evaluations showed clear sinuses. She experiences ear fullness and increased tinnitus during headaches, which subside when she lies down. She also reports sensitivity to repetitive and shrill noises.  She has a history of urinary tract infections and is on continuous antibiotic therapy with Keflex. She also takes buspirone for mental health, Cymbalta, and trazodone for sleep. She is concerned about the number of medications she is taking and their potential side effects, including hair loss and  feeling weak.     Health Maintenance Due  Topic Date Due   Zoster Vaccines- Shingrix (1 of 2) Never done   DTaP/Tdap/Td (2 - Tdap) 06/08/2019   COVID-19 Vaccine (5 - 2024-25 season) 08/15/2023     Past Medical History:  Diagnosis Date   Acute bursitis of right shoulder 08/29/2018   Right shoulder injected in August 29, 2018   Allergic rhinitis    Closed displaced fracture of fifth metatarsal  bone of left foot 03/16/2017   DEPRESSIVE DISORDER NOT ELSEWHERE CLASSIFIED 03/27/2010   Qualifier: Diagnosis of  By: Donnice Gale MD, Sammie Crigler   [name], NP , [facility] [place] [place]     Diabetes (HCC) 04/02/2016   Diabetes mellitus    Essential hypertension 03/14/2008   Qualifier: Diagnosis of  By: Donnice Gale MD, [name]     Fibromyalgia 08/12/2011   Diagnosed by [name] , Rheumatologist 2010    GERD (gastroesophageal reflux disease) 12/25/2016   Gluteal tendonitis of right buttock 10/19/2017   Injected in October 19, 2017 Injected October 07, 2018   Greater trochanteric bursitis of left hip 06/17/2016   Greater trochanteric bursitis of right hip 11/27/2015   Injected 11/27/2015    History of recurrent UTIs    [name]   HTN (hypertension)    Hx of osteopenia 10/11/2012   [facility] DEXA; ordered by Dr Donnice Gale Lowest T score: -2.1 @ lumbar spine @ [facility] 11/01/12; decrease of 8.5% vs 05/2009. There is 9.2% risk over 10 yrs History fracture left elbow @ 4 Fractured left wrist , right elbow and nose post fall July/18/2013. Dr. Annamae Barrett No FH Osteoporosis No PMH of bisphosphonate therapy (generic Fosamax Rxed but not filled  due to polypharmacy )    Hyperlipidemia    Hyperlipidemia associated with type 2 diabetes mellitus (HCC) 01/18/2007   Qualifier: Diagnosis of  By: Nilsa Bash  With DM LDL goal = < 100, ideally < 70. Mi in father @ 28; 2 brothers @ 77 & 71     Lumbar radiculopathy 04/11/2008   Qualifier: Diagnosis of  By: Donnice Gale MD, Ritta Chessman well to epidural 01/12/2017  repeat epidural given May 2018   Migraine headache    quiescent   Nonallopathic lesion of cervical region 07/19/2014   Nonallopathic lesion of lumbosacral region 01/15/2016   Nonallopathic lesion of sacral region 06/27/2018   Nonallopathic lesion-rib cage 09/26/2014   Palpitations 04/15/2011   Rotator cuff impingement syndrome of left shoulder 05/11/2014   Seasonal allergic rhinitis 03/21/2012   Sinusitis, chronic  04/20/2016   Sleep disorder    Dr Dohmier   Subluxation of peroneal tendon of right foot 11/10/2017   TIA (transient ischemic attack)    TRANSIENT ISCHEMIC ATTACKS, HX OF 02/02/2008   Qualifier: Diagnosis of  By: Portia Brittle ADVRS EFF UNS RX MEDICINAL&BIOLOGICAL SBSTNC 02/02/2008   Qualifier: Diagnosis of  By: Donnice Gale MD, Loralee Roche AND MYOSITIS 02/02/2008   Qualifier: Diagnosis of  By: Donnice Gale MD, [name]     UTI (urinary tract infection) 10/05/2014   Viral upper respiratory tract infection with cough 12/31/2014   Vitamin D deficiency 05/25/2008   Qualifier: Diagnosis of  By: Donnice Gale MD, Sammie Crigler      Past Surgical History:  Procedure Laterality Date   COLONOSCOPY     Dr Willy Harvest; hemorrhoids   CORONARY ANGIOPLASTY  12/15/1995   for chest pain- negative    FOOT MASS EXCISION Left 04/08/2023  KNEE ARTHROSCOPY Left 2023   POLYPECTOMY  12/14/2000   benign, hyperplastic polyp; rectal bleeding 2008   TONSILLECTOMY     TOTAL ABDOMINAL HYSTERECTOMY  12/14/1981   BSO for endometriosis   WISDOM TOOTH EXTRACTION       Current Outpatient Medications:    acetaminophen (TYLENOL) 500 MG tablet, Take 500 mg by mouth as needed., Disp: , Rfl:    aspirin 81 MG tablet, Take 81 mg by mouth daily., Disp: , Rfl:    atorvastatin (LIPITOR) 40 MG tablet, TAKE 1 TABLET BY MOUTH EVERY DAY, Disp: 90 tablet, Rfl: 3   azelastine (ASTELIN) 0.1 % nasal spray, Place 2 sprays into both nostrils 2 (two) times daily. Use in each nostril as directed, Disp: 30 mL, Rfl: 12   busPIRone (BUSPAR) 5 MG tablet, Take 5 mg by mouth daily., Disp: , Rfl:    cephALEXin (KEFLEX) 250 MG capsule, TAKE 1 CAPSULE BY MOUTH EVERY DAY, Disp: 90 capsule, Rfl: 1   DULoxetine (CYMBALTA) 60 MG capsule, Take 60 mg by mouth daily. Taking 90 mg daily, Disp: , Rfl:    fluticasone (FLONASE) 50 MCG/ACT nasal spray, Place 2 sprays into both nostrils in the morning and at bedtime., Disp: 16 g, Rfl: 6   glucose  blood (ONETOUCH ULTRA) test strip, 1 each by Other route daily at 12 noon. Use as instructed, Disp: 100 strip, Rfl: 3   Inclisiran Sodium (LEQVIO [place]), Inject into the skin., Disp: , Rfl:    Lancets (ONETOUCH ULTRASOFT) lancets, Check blood sugar once daily. Dx code: 250.00, Disp: 100 each, Rfl: 12   LORazepam (ATIVAN) 0.5 MG tablet, Take 0.5 mg by mouth as needed for anxiety., Disp: , Rfl:    metoprolol succinate (TOPROL-XL) 25 MG 24 hr tablet, Take 0.5 tablets (12.5 mg total) by mouth daily., Disp: 45 tablet, Rfl: 1   omeprazole (PRILOSEC) 20 MG capsule, TAKE 1 CAPSULE BY MOUTH EVERY DAY, Disp: 90 capsule, Rfl: 3   Semaglutide, 2 MG/DOSE, (OZEMPIC, 2 MG/DOSE,) 8 MG/3ML SOPN, Inject 2 mg into the skin once a week. Gets from pt assist, Disp: 2 mL, Rfl: 0   tiZANidine (ZANAFLEX) 4 MG tablet, Take 1 tablet (4 mg total) by mouth at bedtime., Disp: 90 tablet, Rfl: 1   traZODone (DESYREL) 50 MG tablet, TAKE 1/2 TO 1 TABLET BY MOUTH AT BEDTIME AS NEEDED FOR SLEEP, Disp: 90 tablet, Rfl: 1   vitamin B-12 (CYANOCOBALAMIN) 1000 MCG tablet, Take 1,000 mcg by mouth daily., Disp: , Rfl:    Vitamin D, Ergocalciferol, (DRISDOL) 1.25 MG (50000 UNIT) CAPS capsule, TAKE 1 CAPSULE BY MOUTH ONE TIME PER WEEK, Disp: 12 capsule, Rfl: 0  Allergies  Allergen Reactions   Vioxx [Rofecoxib] Other (See Comments)    Excess BP which caused TIA    Prednisone Other (See Comments)    Mental status changes with high-dose oral agent. Tolerates epidural steroid injections. No associated rash or fever   Macrodantin [Nitrofurantoin Macrocrystal] Hives   Sulfa Antibiotics     Hives   Zetia [Ezetimibe]     myalgias   Colesevelam Other (See Comments)    Leg Pain   ROS neg/noncontributory except as noted HPI/below She has had a L breast tenderness/lump for several months.  Normal mamm in Feb.      Objective:     BP 132/78 (BP Location: Left Arm, Patient Position: Sitting, Cuff Size: Normal)   Pulse 81   Temp 97.7 F (36.5  C)   Ht 5\' 6"  (1.676 m)  Wt 133 lb 4.8 oz (60.5 kg)   LMP  (LMP Unknown)   SpO2 100%   BMI 21.52 kg/m  Wt Readings from Last 3 Encounters:  03/30/24 133 lb 4.8 oz (60.5 kg)  02/15/24 133 lb (60.3 kg)  01/18/24 135 lb (61.2 kg)    Physical Exam Chest:        Gen: WDWN NAD HEENT: NCAT, conjunctiva not injected, sclera nonicteric NECK:  supple, no thyromegaly, no nodes, no carotid bruits CARDIAC: RRR, S1S2+, no murmur. DP 2+B LUNGS: CTAB. No wheezes ABDOMEN:  BS+, soft,nt, No HSM, no masses EXT:  no edema MSK: no gross abnormalities.  NEURO: A&O x3.  CN II-XII intact.  PSYCH: normal mood. Good eye contact   Results for orders placed or performed in visit on 03/30/24  POCT HgB A1C   Collection Time: 03/30/24 11:23 AM  Result Value Ref Range   Hemoglobin A1C 7.3 (A) 4.0 - 5.6 %   HbA1c POC (<> result, manual entry)     HbA1c, POC (prediabetic range)     HbA1c, POC (controlled diabetic range)     *Note: Due to a large number of results and/or encounters for the requested time period, some results have not been displayed. A complete set of results can be found in Results Review.     Diabetic Foot Exam - Simple   Simple Foot Form Diabetic Foot exam was performed with the following findings: Yes 03/30/2024 11:48 AM  Visual Inspection No deformities, no ulcerations, no other skin breakdown bilaterally: Yes Sensation Testing Intact to touch and monofilament testing bilaterally: Yes Pulse Check Posterior Tibialis and Dorsalis pulse intact bilaterally: Yes Comments         Assessment & Plan:  Type 2 diabetes mellitus with hyperglycemia, without long-term current use of insulin (HCC) -     POCT glycosylated hemoglobin (Hb A1C)  Essential hypertension  Dizziness  Mass of upper outer quadrant of left breast -     MM 3D DIAGNOSTIC MAMMOGRAM BILATERAL BREAST; Future -     US BREAST COMPLETE UNI LEFT INC AXILLA; Future -     US BREAST COMPLETE UNI RIGHT INC AXILLA;  Future  Assessment and Plan Assessment & Plan Pituitary Adenoma   A pituitary adenoma was recently diagnosed on MRI. Symptoms include headaches, dizziness, and visual disturbances, which may or may not be related to the adenoma. She also experiences migraines with visual aura, jaw pain, and brain fog. An endocrinology evaluation is pending to determine if the adenoma is symptomatic or incidental. Surgical intervention will be considered if optic nerve compression causes double vision; otherwise, management may be medical or observational. Complete blood work as ordered and refer to endocrinology for further evaluation.  Type 2 Diabetes Mellitus   Type 2 Diabetes Mellitus is managed with Ozempic 2 mg weekly. Blood glucose levels are slightly elevated, with occasional morning readings up to 155 mg/dL, usually in the 657Q. Hemoglobin A1c is 7.3%. She has difficulty managing her diet due to dizziness and weakness, affecting meal choices. London Pepper was dismissed due to UTI history and infection risk. Ozempic is beneficial for kidney protection, crucial given the elevated microalbumin creatinine ratio indicating early kidney disease risk. Continue Ozempic 2 mg weekly, monitor blood glucose levels regularly, maintain current dietary habits as tolerated, and avoid Jardiance due to UTI risk.  Hypertension   Blood pressure is generally normal at home but fluctuates, possibly due to pain and anxiety. The current reading is 132/78 mmHg. She experiences dizziness and weakness,  potentially related to autonomic dysfunction or other issues. Anxiety and pain impact on blood pressure and potential autonomic dysfunction were discussed. Monitor blood pressure regularly at home and consider non-pharmacological interventions for anxiety and pain management.  Chronic Migraines   Chronic migraines have recently exacerbated, with two severe episodes in the past two weeks with visual aura. Symptoms include a dull ache, ear  fullness, and increased tinnitus during headaches. There is a possible vestibular migraine component due to dizziness and balance issues. Current management includes muscle relaxers as needed, with limited efficacy from Tylenol. Consider vestibular migraine as a differential diagnosis, continue current headache management strategies, and avoid identified triggers.  Chronic UTIs   Recurrent UTIs are managed with prophylactic antibiotics (Keflex). There is concern about medication burden and potential confusion. Evone Hoh is avoided due to UTI exacerbation risk. Continue prophylactic Keflex as prescribed and monitor for signs of UTI recurrence.  General Health Maintenance   She is on multiple medications, including Cymbalta and buspirone for mental health, with concern about medication burden. B12 levels are adequate with current supplementation. The impact of medication on overall health and well-being was discussed. She experiences hair loss, possibly related to medication or nutritional factors. Continue the current medication regimen as prescribed, monitor mental health, and discuss concerns with a mental health provider.   Breast mass-L-order mamm/u/s  Return in about 3 months (around 06/29/2024) for chronic follow-up.   Ellsworth Haas, MD

## 2024-04-04 NOTE — Progress Notes (Unsigned)
 [facility] [address] [place] [address] Phone: [phone] Subjective:   IBryan Caprio, am serving as a scribe for Dr. Ronnell Coins.  I'm seeing this patient by the request  of:  Christel Cousins, MD  CC: Headaches, neck pain follow-up  GMW:[id number]  Christine [name] is a 73 y.o. female coming in with complaint of back and neck pain. OMT 02/15/2024. Referred to endocrinology. Also came into office for labwork recently. Patient states wants to talk about MRI.  Medications patient has been prescribed: Zanaflex , Vit D  Taking:     Patient did have an MRI of the brain recently that did show a 5 mm pituitary mass that likely was an adenoma.  MRI is changed from previous MRIs from 2022 and 2024.  When reviewing with contrast from 2021 also no adenoma noted.  Awaiting some lab work still.    Reviewed prior external information including notes and imaging from previsou exam, outside providers and external EMR if available.   As well as notes that were available from care everywhere and other healthcare systems.  Past medical history, social, surgical and family history all reviewed in electronic medical record.  No pertanent information unless stated regarding to the chief complaint.   Past Medical History:  Diagnosis Date   Acute bursitis of right shoulder 08/29/2018   Right shoulder injected in August 29, 2018   Allergic rhinitis    Closed displaced fracture of fifth metatarsal bone of left foot 03/16/2017   DEPRESSIVE DISORDER NOT ELSEWHERE CLASSIFIED 03/27/2010   Qualifier: Diagnosis of  By: Donnice Gale MD, Sammie Crigler   [name], NP , [facility] [place]     Diabetes (HCC) 04/02/2016   Diabetes mellitus    Essential hypertension 03/14/2008   Qualifier: Diagnosis of  By: Donnice Gale MD, [name]     Fibromyalgia 08/12/2011   Diagnosed by [name] , Rheumatologist 2010    GERD (gastroesophageal reflux disease) 12/25/2016    Gluteal tendonitis of right buttock 10/19/2017   Injected in October 19, 2017 Injected October 07, 2018   Greater trochanteric bursitis of left hip 06/17/2016   Greater trochanteric bursitis of right hip 11/27/2015   Injected 11/27/2015    History of recurrent UTIs    [name]   HTN (hypertension)    Hx of osteopenia 10/11/2012   [facility] DEXA; ordered by Dr Donnice Gale Lowest T score: -2.1 @ lumbar spine @ [facility] 11/01/12; decrease of 8.5% vs 05/2009. There is 9.2% risk over 10 yrs History fracture left elbow @ 4 Fractured left wrist , right elbow and nose post fall July/18/2013. Dr. Annamae Barrett No FH Osteoporosis No PMH of bisphosphonate therapy (generic Fosamax  Rxed but not filled  due to polypharmacy )    Hyperlipidemia    Hyperlipidemia associated with type 2 diabetes mellitus (HCC) 01/18/2007   Qualifier: Diagnosis of  By: Nilsa Bash  With DM LDL goal = < 100, ideally < 70. Mi in father @ 27; 2 brothers @ 7 & 29     Lumbar radiculopathy 04/11/2008   Qualifier: Diagnosis of  By: Donnice Gale MD, Ritta Chessman well to epidural 01/12/2017  repeat epidural given May 2018   Migraine headache    quiescent   Nonallopathic lesion of cervical region 07/19/2014   Nonallopathic lesion of lumbosacral region 01/15/2016   Nonallopathic lesion of sacral region 06/27/2018   Nonallopathic lesion-rib cage 09/26/2014   Palpitations 04/15/2011   Rotator cuff impingement syndrome of  left shoulder 05/11/2014   Seasonal allergic rhinitis 03/21/2012   Sinusitis, chronic 04/20/2016   Sleep disorder    Dr Dohmier   Subluxation of peroneal tendon of right foot 11/10/2017   TIA (transient ischemic attack)    TRANSIENT ISCHEMIC ATTACKS, HX OF 02/02/2008   Qualifier: Diagnosis of  By: Portia Brittle ADVRS EFF UNS RX MEDICINAL&BIOLOGICAL SBSTNC 02/02/2008   Qualifier: Diagnosis of  By: Donnice Gale MD, Loralee Roche AND MYOSITIS 02/02/2008   Qualifier: Diagnosis of  By: Donnice Gale MD, Sammie Crigler     UTI (urinary  tract infection) 10/05/2014   Viral upper respiratory tract infection with cough 12/31/2014   Vitamin D  deficiency 05/25/2008   Qualifier: Diagnosis of  By: Donnice Gale MD, Sammie Crigler      Allergies  Allergen Reactions   Vioxx [Rofecoxib] Other (See Comments)    Excess BP which caused TIA    Prednisone  Other (See Comments)    Mental status changes with high-dose oral agent. Tolerates epidural steroid injections. No associated rash or fever   Macrodantin  [Nitrofurantoin  Macrocrystal] Hives   Sulfa  Antibiotics     Hives   Zetia  [Ezetimibe ]     myalgias   Colesevelam Other (See Comments)    Leg Pain     Review of Systems:  No  visual changes,  vomiting, diarrhea, constipation,  abdominal pain, skin rash, fevers, chills, night sweats, weight loss, swollen lymph nodes, body aches, joint swelling, chest pain, shortness of breath, mood changes. POSITIVE muscle aches, headache, dizziness, nausea  Objective  Blood pressure 112/80, pulse 84, height 5\' 6"  (1.676 m), weight 133 lb (60.3 kg), SpO2 96%.   General: No apparent distress alert and oriented x3 mood and affect normal, dressed appropriately.  HEENT: Pupils equal, extraocular movements intact  Respiratory: Patient's speak in full sentences and does not appear short of breath  Cardiovascular: No lower extremity edema, non tender, no erythema  Gait MSK:  Back does have some loss of lordosis.  Tightness noted in the paraspinal musculature of the lumbosacral area. Neck exam does have some loss of lordosis.  No crepitus noted.  Negative Spurling's today but worsening with loss of extension of the neck today.  Osteopathic findings  C2 flexed rotated and side bent right C6 flexed rotated and side bent left T3 extended rotated and side bent right inhaled rib T9 extended rotated and side bent left L2 flexed rotated and side bent right Sacrum right on right       Assessment and Plan:  Pituitary mass [facility]) Still awaiting some other labs  at the moment.  I do need to see where patient's calcium  level is.  Continuing to have headaches.  Does have some degenerative disc of the cervical spine that I think could be potentially contributing to some of the headaches as well and we will try an epidural first.  In the interim I do think we do need to consider the referral to neurosurgery just to discuss.  Think further evaluation of the pituitary mass would be good even though the size is very small but it is new from previous imaging in 2021.  Patient is somewhat symptomatic as well has had a history of headaches but seems to be having increasing severity and frequency.    Nonallopathic problems  Decision today to treat with OMT was based on Physical Exam  After verbal consent patient was treated with  ME, FPR techniques in cervical, rib, thoracic, lumbar, and sacral  areas  Patient tolerated the procedure well with improvement in symptoms  Patient given exercises, stretches and lifestyle modifications  See medications in patient instructions if given  Patient will follow up in 4-8 weeks     The above documentation has been reviewed and is accurate and complete Tonio Seider M Alexzander Dolinger, DO         Note: This dictation was prepared with Dragon dictation along with smaller phrase technology. Any transcriptional errors that result from this process are unintentional.

## 2024-04-05 ENCOUNTER — Encounter: Payer: Self-pay | Admitting: Family Medicine

## 2024-04-05 ENCOUNTER — Ambulatory Visit: Admitting: Family Medicine

## 2024-04-05 VITALS — BP 112/80 | HR 84 | Ht 66.0 in | Wt 133.0 lb

## 2024-04-05 DIAGNOSIS — M9901 Segmental and somatic dysfunction of cervical region: Secondary | ICD-10-CM | POA: Diagnosis not present

## 2024-04-05 DIAGNOSIS — M9902 Segmental and somatic dysfunction of thoracic region: Secondary | ICD-10-CM

## 2024-04-05 DIAGNOSIS — M9904 Segmental and somatic dysfunction of sacral region: Secondary | ICD-10-CM | POA: Diagnosis not present

## 2024-04-05 DIAGNOSIS — M5010 Cervical disc disorder with radiculopathy, unspecified cervical region: Secondary | ICD-10-CM | POA: Diagnosis not present

## 2024-04-05 DIAGNOSIS — M9908 Segmental and somatic dysfunction of rib cage: Secondary | ICD-10-CM | POA: Diagnosis not present

## 2024-04-05 DIAGNOSIS — E236 Other disorders of pituitary gland: Secondary | ICD-10-CM

## 2024-04-05 DIAGNOSIS — M9903 Segmental and somatic dysfunction of lumbar region: Secondary | ICD-10-CM | POA: Diagnosis not present

## 2024-04-05 MED ORDER — HYDROXYZINE HCL 10 MG PO TABS: 10.0000 mg | ORAL_TABLET | Freq: Three times a day (TID) | ORAL | 0 refills | Status: DC | PRN

## 2024-04-05 NOTE — Assessment & Plan Note (Signed)
 Known arthritis with nerve root impingement.  Will get epidural at the C6-7 T1 area and see how patient responds.  Patient has had these before and is a diabetic but will monitor blood sugars.

## 2024-04-05 NOTE — Patient Instructions (Addendum)
 Good to see you. Return in 5 to 6 weeks. Epi C7-T1 at [facility]. Start Hydroxyzine  10 mg.

## 2024-04-05 NOTE — Assessment & Plan Note (Signed)
 Still awaiting some other labs at the moment.  I do need to see where patient's calcium  level is.  Continuing to have headaches.  Does have some degenerative disc of the cervical spine that I think could be potentially contributing to some of the headaches as well and we will try an epidural first.  In the interim I do think we do need to consider the referral to neurosurgery just to discuss.  Think further evaluation of the pituitary mass would be good even though the size is very small but it is new from previous imaging in 2021.  Patient is somewhat symptomatic as well has had a history of headaches but seems to be having increasing severity and frequency.

## 2024-04-09 LAB — PTH, INTACT AND CALCIUM
Calcium: 9.7 mg/dL (ref 8.6–10.4)
PTH: 21 pg/mL (ref 16–77)

## 2024-04-09 LAB — EXTRA SPECIMEN

## 2024-04-09 LAB — ACTH: C206 ACTH: 18 pg/mL (ref 6–50)

## 2024-04-09 LAB — INSULIN-LIKE GROWTH FACTOR
IGF-I, LC/MS: 65 ng/mL (ref 34–245)
Z-Score (Female): -0.9 {STDV} (ref ?–2.0)

## 2024-04-09 LAB — PTH-RELATED PEPTIDE: PTH-Related Protein (PTH-RP): 13 pg/mL (ref 11–20)

## 2024-04-09 LAB — PROLACTIN: Prolactin: 33.8 ng/mL — ABNORMAL HIGH

## 2024-04-10 ENCOUNTER — Encounter: Payer: Self-pay | Admitting: Family Medicine

## 2024-04-10 DIAGNOSIS — E236 Other disorders of pituitary gland: Secondary | ICD-10-CM

## 2024-04-12 NOTE — Discharge Instructions (Signed)
 Post Procedure Spinal Discharge Instruction Sheet  You may resume a regular diet and any medications that you routinely take (including pain medications) unless otherwise noted by MD.  No driving day of procedure.  Light activity throughout the rest of the day.  Do not do any strenuous work, exercise, bending or lifting.  The day following the procedure, you can resume normal physical activity but you should refrain from exercising or physical therapy for at least three days thereafter.  You may apply ice to the injection site, 20 minutes on, 20 minutes off, as needed. Do not apply ice directly to skin.    Common Side Effects:  Headaches- take your usual medications as directed by your physician.  Increase your fluid intake.  Caffeinated beverages may be helpful.  Lie flat in bed until your headache resolves.  Restlessness or inability to sleep- you may have trouble sleeping for the next few days.  Ask your referring physician if you need any medication for sleep.  Facial flushing or redness- should subside within a few days.  Increased pain- a temporary increase in pain a day or two following your procedure is not unusual.  Take your pain medication as prescribed by your referring physician.  Leg cramps  Please contact our office at [phone] for the following symptoms: Fever greater than 100 degrees. Headaches unresolved with medication after 2-3 days. Increased swelling, pain, or redness at injection site.   YOU MAY RESUME TAKING YOUR ASPIRIN TODAY.  Thank you for visiting [facility] today.

## 2024-04-13 ENCOUNTER — Emergency Department (HOSPITAL_BASED_OUTPATIENT_CLINIC_OR_DEPARTMENT_OTHER)
Admission: EM | Admit: 2024-04-13 | Discharge: 2024-04-13 | Disposition: A | Discharge status: Home / Self Care | Attending: Emergency Medicine | Admitting: Emergency Medicine

## 2024-04-13 ENCOUNTER — Encounter (HOSPITAL_BASED_OUTPATIENT_CLINIC_OR_DEPARTMENT_OTHER): Payer: Self-pay

## 2024-04-13 ENCOUNTER — Emergency Department (HOSPITAL_BASED_OUTPATIENT_CLINIC_OR_DEPARTMENT_OTHER)

## 2024-04-13 ENCOUNTER — Encounter: Payer: Self-pay | Admitting: Family Medicine

## 2024-04-13 ENCOUNTER — Ambulatory Visit
Admission: RE | Admit: 2024-04-13 | Discharge: 2024-04-13 | Disposition: A | Discharge status: Home / Self Care | Source: Ambulatory Visit | Attending: Family Medicine | Admitting: Family Medicine

## 2024-04-13 ENCOUNTER — Other Ambulatory Visit: Payer: Self-pay

## 2024-04-13 DIAGNOSIS — E1165 Type 2 diabetes mellitus with hyperglycemia: Secondary | ICD-10-CM | POA: Diagnosis not present

## 2024-04-13 DIAGNOSIS — M4722 Other spondylosis with radiculopathy, cervical region: Secondary | ICD-10-CM | POA: Diagnosis not present

## 2024-04-13 DIAGNOSIS — R519 Headache, unspecified: Secondary | ICD-10-CM | POA: Insufficient documentation

## 2024-04-13 DIAGNOSIS — M5010 Cervical disc disorder with radiculopathy, unspecified cervical region: Secondary | ICD-10-CM

## 2024-04-13 DIAGNOSIS — Z7982 Long term (current) use of aspirin: Secondary | ICD-10-CM | POA: Insufficient documentation

## 2024-04-13 DIAGNOSIS — I6782 Cerebral ischemia: Secondary | ICD-10-CM | POA: Diagnosis not present

## 2024-04-13 DIAGNOSIS — R739 Hyperglycemia, unspecified: Secondary | ICD-10-CM | POA: Diagnosis present

## 2024-04-13 LAB — URINALYSIS, MICROSCOPIC (REFLEX): WBC, UA: NONE SEEN WBC/hpf (ref 0–5)

## 2024-04-13 LAB — CBG MONITORING, ED
Glucose-Capillary: 279 mg/dL — ABNORMAL HIGH (ref 70–99)
Glucose-Capillary: 288 mg/dL — ABNORMAL HIGH (ref 70–99)
Glucose-Capillary: 255 mg/dL — ABNORMAL HIGH (ref 70–99)

## 2024-04-13 LAB — URINALYSIS, ROUTINE W REFLEX MICROSCOPIC
Bilirubin Urine: NEGATIVE
Glucose, UA: >=500 mg/dL — AB
Ketones, ur: NEGATIVE mg/dL
Leukocytes,Ua: NEGATIVE
Nitrite: NEGATIVE
Protein, ur: NEGATIVE mg/dL
Specific Gravity, Urine: 1.01 (ref 1.005–1.030)
pH: 7 (ref 5.0–8.0)

## 2024-04-13 LAB — BASIC METABOLIC PANEL WITH GFR
Anion gap: 15 (ref 5–15)
BUN: 21 mg/dL (ref 8–23)
CO2: 20 mmol/L — ABNORMAL LOW (ref 22–32)
Calcium: 9.6 mg/dL (ref 8.9–10.3)
Chloride: 99 mmol/L (ref 98–111)
Creatinine, Ser: 0.85 mg/dL (ref 0.44–1.00)
GFR, Estimated: >60 mL/min (ref >60)
Glucose, Bld: 295 mg/dL — ABNORMAL HIGH (ref 70–99)
Potassium: 4.5 mmol/L (ref 3.5–5.1)
Sodium: 134 mmol/L — ABNORMAL LOW (ref 135–145)

## 2024-04-13 LAB — CBC
HCT: 37.6 % (ref 36.0–46.0)
Hemoglobin: 12.8 g/dL (ref 12.0–15.0)
MCH: 30.3 pg (ref 26.0–34.0)
MCHC: 34 g/dL (ref 30.0–36.0)
MCV: 88.9 fL (ref 80.0–100.0)
Platelets: 222 10*3/uL (ref 150–400)
RBC: 4.23 MIL/uL (ref 3.87–5.11)
RDW: 12.3 % (ref 11.5–15.5)
WBC: 6.7 10*3/uL (ref 4.0–10.5)
nRBC: 0 % (ref 0.0–0.2)

## 2024-04-13 MED ORDER — SODIUM CHLORIDE 0.9 % IV BOLUS
1000.0000 mL | Freq: Once | INTRAVENOUS | Status: AC
  Administered 2024-04-13: 1000 mL via INTRAVENOUS

## 2024-04-13 MED ORDER — INSULIN REGULAR HUMAN 100 UNIT/ML IJ SOLN: 4.0000 [IU] | Freq: Once | INTRAMUSCULAR | Status: DC

## 2024-04-13 MED ORDER — IOPAMIDOL (ISOVUE-M 300) INJECTION 61%
1.0000 mL | Freq: Once | INTRAMUSCULAR | Status: AC | PRN
  Administered 2024-04-13: 1 mL via EPIDURAL

## 2024-04-13 MED ORDER — TRAMADOL HCL 50 MG PO TABS
100.0000 mg | ORAL_TABLET | Freq: Once | ORAL | Status: AC
  Administered 2024-04-13: 100 mg via ORAL
  Filled 2024-04-13: qty 2

## 2024-04-13 MED ORDER — TRIAMCINOLONE ACETONIDE 40 MG/ML IJ SUSP (RADIOLOGY)
60.0000 mg | Freq: Once | INTRAMUSCULAR | Status: AC
  Administered 2024-04-13: 60 mg via EPIDURAL

## 2024-04-13 NOTE — Discharge Instructions (Signed)
 1.  Take an evening dose of your metoprolol  when you get home. 2.  Continue to monitor your blood sugars at home over the next 2 days.  May experience an increase in your blood sugars after your injection.  Review this with your doctor to determine if you need to change the dose of your semaglutide  or add a second medication temporarily. 3.  Return to emergency department if you experience nausea and vomiting, worsening headache, lightheadedness, fever or other concerning symptoms.

## 2024-04-13 NOTE — ED Notes (Signed)
 Pt. Is in no distress and has less c/o headache.  7/10 with pain.  Pt. Husband at bedside.

## 2024-04-13 NOTE — ED Provider Notes (Signed)
 [facility] Provider Note   CSN: [id number] Arrival date & time: 04/13/24  1938     History  Chief Complaint  Patient presents with   Headache   Hyperglycemia    Christine [name] is a 73 y.o. female.  HPI Patient reports that she has had an interlaminar epidural cervical steroid injection.  She reports that she developed a headache and neck pain.  She does have history of headaches previously.  No neurologic dysfunction.  She reports her blood sugars have been elevated to 300s today.  She reports has never had blood sugar significantly elevated like this and was not sure how to manage it since she is taking semaglutide  and cannot adjust any doses.    Home Medications Prior to Admission medications   Medication Sig Start Date End Date Taking? Authorizing Provider  acetaminophen  (TYLENOL ) 500 MG tablet Take 500 mg by mouth as needed.    [provider]  aspirin  81 MG tablet Take 81 mg by mouth daily.    [provider]  atorvastatin  (LIPITOR) 40 MG tablet TAKE 1 TABLET BY MOUTH EVERY DAY 07/30/23   Christel Cousins, MD  azelastine  (ASTELIN ) 0.1 % nasal spray Place 2 sprays into both nostrils 2 (two) times daily. Use in each nostril as directed 01/18/24   Evelina Hippo, MD  busPIRone (BUSPAR) 5 MG tablet Take 5 mg by mouth daily. 03/10/24   [provider]  cephALEXin  (KEFLEX ) 250 MG capsule TAKE 1 CAPSULE BY MOUTH EVERY DAY 03/19/24   Christel Cousins, MD  DULoxetine  (CYMBALTA ) 60 MG capsule Take 60 mg by mouth daily. Taking 90 mg daily    [provider]  fluticasone  (FLONASE ) 50 MCG/ACT nasal spray Place 2 sprays into both nostrils in the morning and at bedtime. 01/18/24   Milon Aloe B, MD  glucose blood (ONETOUCH ULTRA) test strip 1 each by Other route daily at 12 noon. Use as instructed 07/01/23   Christel Cousins, MD  hydrOXYzine  (ATARAX ) 10 MG tablet Take 1 tablet (10 mg total) by mouth 3 (three) times  daily as needed. 04/05/24   Isidro Margo, DO  Inclisiran Sodium  (LEQVIO  Jay) Inject into the skin.    [provider]  Lancets [facility] ULTRASOFT) lancets Check blood sugar once daily. Dx code: 250.00 11/08/13   Alinda Apley, MD  LORazepam  (ATIVAN ) 0.5 MG tablet Take 0.5 mg by mouth as needed for anxiety.    [provider]  metoprolol  succinate (TOPROL -XL) 25 MG 24 hr tablet Take 0.5 tablets (12.5 mg total) by mouth daily. 12/31/23   Christel Cousins, MD  omeprazole  (PRILOSEC) 20 MG capsule TAKE 1 CAPSULE BY MOUTH EVERY DAY 07/01/23   Christel Cousins, MD  Semaglutide , 2 MG/DOSE, (OZEMPIC , 2 MG/DOSE,) 8 MG/3ML SOPN Inject 2 mg into the skin once a week. Gets from pt assist 02/09/24   Christel Cousins, MD  tiZANidine  (ZANAFLEX ) 4 MG tablet Take 1 tablet (4 mg total) by mouth at bedtime. 12/27/23   Isidro Margo, DO  traZODone  (DESYREL ) 50 MG tablet TAKE 1/2 TO 1 TABLET BY MOUTH AT BEDTIME AS NEEDED FOR SLEEP 09/09/23   Webb, Padonda B, FNP  vitamin B-12 (CYANOCOBALAMIN ) 1000 MCG tablet Take 1,000 mcg by mouth daily.    [provider]  Vitamin D , Ergocalciferol , (DRISDOL ) 1.25 MG (50000 UNIT) CAPS capsule TAKE 1 CAPSULE BY MOUTH ONE TIME PER WEEK 02/01/24   Smith, Zachary M,  DO      Allergies    Vioxx [rofecoxib], Prednisone , Macrodantin  [nitrofurantoin  macrocrystal], Sulfa  antibiotics, Zetia  [ezetimibe ], and Colesevelam    Review of Systems   Review of Systems  Physical Exam Updated Vital Signs BP (!) 178/77   Pulse 84   Temp 97.8 F (36.6 C) (Oral)   Resp 18   Ht 5\' 6"  (1.676 m)   Wt 60.3 kg   LMP  (LMP Unknown)   SpO2 97%   BMI 21.47 kg/m  Physical Exam Constitutional:      Comments: Patient is alert nontoxic.  Clinically well in appearance.  Well-nourished well-developed.  HENT:     Head: Normocephalic and atraumatic.     Right Ear: External ear normal.     Left Ear: External ear normal.     Nose: Nose normal.     Mouth/Throat:     Mouth:  Mucous membranes are moist.     Pharynx: Oropharynx is clear.  Eyes:     Extraocular Movements: Extraocular movements intact.     Pupils: Pupils are equal, round, and reactive to light.  Neck:     Comments: No soft tissue swelling.  No meningismus.  Normal cervical exam. Cardiovascular:     Rate and Rhythm: Normal rate and regular rhythm.  Pulmonary:     Effort: Pulmonary effort is normal.     Breath sounds: Normal breath sounds.  Abdominal:     General: There is no distension.     Palpations: Abdomen is soft.     Tenderness: There is no abdominal tenderness. There is no guarding.  Musculoskeletal:        General: No swelling or tenderness. Normal range of motion.     Cervical back: Neck supple.     Right lower leg: No edema.     Left lower leg: No edema.  Skin:    General: Skin is warm and dry.  Neurological:     General: No focal deficit present.     Mental Status: She is oriented to person, place, and time.     Cranial Nerves: No cranial nerve deficit.     Sensory: No sensory deficit.     Motor: No weakness.     Coordination: Coordination normal.  Psychiatric:        Mood and Affect: Mood normal.     ED Results / Procedures / Treatments   Labs (all labs ordered are listed, but only abnormal results are displayed) Labs Reviewed  BASIC METABOLIC PANEL WITH GFR - Abnormal; Notable for the following components:      Result Value   Sodium 134 (*)    CO2 20 (*)    Glucose, Bld 295 (*)    All other components within normal limits  URINALYSIS, ROUTINE W REFLEX MICROSCOPIC - Abnormal; Notable for the following components:   Glucose, UA >=500 (*)    Hgb urine dipstick SMALL (*)    All other components within normal limits  URINALYSIS, MICROSCOPIC (REFLEX) - Abnormal; Notable for the following components:   Bacteria, UA RARE (*)    All other components within normal limits  CBG MONITORING, ED - Abnormal; Notable for the following components:   Glucose-Capillary 279 (*)     All other components within normal limits  CBG MONITORING, ED - Abnormal; Notable for the following components:   Glucose-Capillary 288 (*)    All other components within normal limits  CBG MONITORING, ED - Abnormal; Notable for the following components:   Glucose-Capillary 255 (*)  All other components within normal limits  CBC  CBG MONITORING, ED    EKG None  Radiology CT Head Wo Contrast Result Date: 04/13/2024 EXAM: CT HEAD WITHOUT 04/13/2024 09:21:21 PM TECHNIQUE: CT of the head was performed without the administration of intravenous contrast. Automated exposure control, iterative reconstruction, and/or weight based adjustment of the mA/kV was utilized to reduce the radiation dose to as low as reasonably achievable. COMPARISON: MR brain dated 03/07/2024. CLINICAL HISTORY: Headache, sudden, severe. 73 y/o female. Patient had procedure done for compressed disc in neck today at 1130. Reports headaches, dizziness, increased blood sugar, increased urinary urgency. FINDINGS: BRAIN AND VENTRICLES: There is no acute intracranial hemorrhage, mass effect or midline shift. No abnormal extra-axial fluid collection. The gray-white differentiation is maintained without evidence of an acute infarct. There is no evidence of hydrocephalus. Global cortical atrophy. Subcortical and periventricular small vessel ischemic changes. Old right frontal infarct. ORBITS: The visualized portion of the orbits demonstrate no acute abnormality. SINUSES: The visualized paranasal sinuses and mastoid air cells demonstrate no acute abnormality. SOFT TISSUES AND SKULL: No acute abnormality of the visualized skull or soft tissues. IMPRESSION: 1. No acute intracranial abnormality. 2. Atrophy with small vessel ischemic changes. 3. Old right frontal infarct. Electronically signed by: Zadie Herter MD 04/13/2024 09:27 PM EDT RP Workstation: [id number]   DG INJECT DIAG/THERA/INC NEEDLE/CATH/PLC EPI/CERV/THOR W/IMG Result Date:  04/13/2024 CLINICAL DATA:  Cervical spondylosis without myelopathy. Good response to a C7-T1 interlaminar epidural injection in 2021. Neck pain, headache, and right upper extremity tingling. FLUOROSCOPY: Radiation Exposure Index (as provided by the fluoroscopic device): 0.80 mGy Kerma PROCEDURE: The procedure, risks, benefits, and alternatives were explained to the patient. Questions regarding the procedure were encouraged and answered. The patient understands and consents to the procedure. CERVICAL EPIDURAL INJECTION An interlaminar approach was performed on the right at C7-T1. A 3.5 inch 20 gauge epidural needle was advanced using loss-of-resistance technique. DIAGNOSTIC EPIDURAL INJECTION Injection of Isovue -M 300 shows a good epidural pattern with spread above and below the level of needle placement primarily on the right. No vascular opacification is seen. THERAPEUTIC EPIDURAL INJECTION 60 mg of Kenalog  mixed with 2 mL of normal saline were then instilled. The procedure was well-tolerated, and the patient was discharged 20 minutes following the injection in good condition. IMPRESSION: Technically successful interlaminar epidural injection on the right at C7-T1. Electronically Signed   By: Aundra Lee M.D.   On: 04/13/2024 12:43    Procedures Procedures    Medications Ordered in ED Medications  sodium chloride  0.9 % bolus 1,000 mL (0 mLs Intravenous Stopped 04/13/24 2246)  traMADol  (ULTRAM ) tablet 100 mg (100 mg Oral Given 04/13/24 2059)    ED Course/ Medical Decision Making/ A&P                                 Medical Decision Making Amount and/or Complexity of Data Reviewed Labs: ordered. Radiology: ordered.  Risk Prescription drug management.   Patient had injection done today as outlined.  She does note pain in headache.  Also she has concern for elevated blood sugar.  At this time patient's clinical exam is normal.  She is clinically well in appearance and has no neurologic  dysfunction.  No fever.  Blood sugar is elevated at 279.  She reports this is a typically high for her.  At this time we will treat headache and obtain CT scan as well as hydrate.  CT head no acute findings by radiology.  Patient has normal white count normal H&H normal platelets.  Normal GFR.  Urinalysis negative.  After treatment with fluid hydration CBG is at 255.  No signs of hyperglycemic hyperosmolar state.  Patient does have history of migraines and fibromyalgia.  Headache does not have any associated neurologic dysfunction, no fever.  At this time patient was treated with tramadol  which she does take at home for pain and rehydration with normal saline.  Patient feels improved and is well in appearance at this time I have low suspicion for immediate complications from her recent injection.  This will need to continue to be monitored closely but at this time stable for discharge with close follow-up.  Patient will be following up with PCP to discuss whether or not to increase semaglutide  dosing or add second agent with hyperglycemia likely in association with recent steroid administration.        Final Clinical Impression(s) / ED Diagnoses Final diagnoses:  Nonintractable episodic headache, unspecified headache type  Hyperglycemia due to diabetes mellitus [facility])    Rx / DC Orders ED Discharge Orders     None         Wynetta Heckle, MD 04/14/24 2350

## 2024-04-13 NOTE — ED Triage Notes (Signed)
 Told by PCP to be seen for elevated blood sugar  Had procedure done for compressed disc in neck today at 1130. Reports headaches, dizziness, increased blood sugar, increased urinary urgency  A&Ox4, ambulatory

## 2024-04-13 NOTE — ED Notes (Signed)
 Pt. Reports headache on each side of temple and in front of head and on nasal area.

## 2024-04-17 ENCOUNTER — Telehealth: Payer: Self-pay

## 2024-04-17 NOTE — Transitions of Care (Post Inpatient/ED Visit) (Signed)
 04/17/2024  Name: Christine [name] MRN: [id number] DOB: [birth year]  Today's TOC FU Call Status: Today's TOC FU Call Status:: Successful TOC FU Call Completed TOC FU Call Complete Date: 04/17/24 Patient's Name and Date of Birth confirmed.  Transition Care Management Follow-up Telephone Call Date of Discharge: 04/13/24 Discharge Facility: [facility]) Type of Discharge: Emergency Department Reason for ED Visit: Other: (Headache    Hyperglycemia) How have you been since you were released from the hospital?: Same (Headache has improved, but glucose still running high- 336 last night and 203 this morning) Any questions or concerns?: Yes Patient Questions/Concerns:: pt states she thought insulin  was going to be sent to the pharmacy for her, but pharmacy has not recieved anything yet from PCP. Glucose readings: 336 yesterday and 203 today Patient Questions/Concerns Addressed: Notified Provider of Patient Questions/Concerns, Other: (scheduled patient for follow up appointment for tomorrow (04/18/24) with Dr. Waldo Guitar)  Items Reviewed: Did you receive and understand the discharge instructions provided?: Yes Medications obtained,verified, and reconciled?: Yes (Medications Reviewed) Any new allergies since your discharge?: No Dietary orders reviewed?: Yes Type of Diet Ordered:: patient is doing low carb intake Do you have support at home?: Yes People in Home [RPT]: spouse  Medications Reviewed Today: Medications Reviewed Today     Reviewed by Chauncy Coral, CMA (Certified Medical Assistant) on 04/17/24 at 1525  Med List Status: <None>   Medication Order Taking? Sig Documenting Provider Last Dose Status Informant  acetaminophen  (TYLENOL ) 500 MG tablet [id number] No Take 500 mg by mouth as needed. [provider] Taking Active   aspirin  81 MG tablet [id number] No Take 81 mg by mouth daily. [provider] Taking Active Spouse/Significant Other  atorvastatin  (LIPITOR) 40 MG  tablet [id number] No TAKE 1 TABLET BY MOUTH EVERY DAY Christel Cousins, MD Taking Active   azelastine  (ASTELIN ) 0.1 % nasal spray [id number] No Place 2 sprays into both nostrils 2 (two) times daily. Use in each nostril as directed Evelina Hippo, MD Taking Active   busPIRone (BUSPAR) 5 MG tablet [id number]  Take 5 mg by mouth daily. [provider]  Active   cephALEXin  (KEFLEX ) 250 MG capsule [id number] No TAKE 1 CAPSULE BY MOUTH EVERY DAY Christel Cousins, MD Taking Active   DULoxetine  (CYMBALTA ) 60 MG capsule [id number] No Take 60 mg by mouth daily. Taking 90 mg daily [provider] Taking Active Spouse/Significant Other           Med Note Sharalyn Dasen, Dimple Francis   Thu Aug 14, 2021  8:38 AM)    fluticasone  (FLONASE ) 50 MCG/ACT nasal spray [id number] No Place 2 sprays into both nostrils in the morning and at bedtime. Evelina Hippo, MD Taking Active   glucose blood [facility] ULTRA) test strip [id number] No 1 each by Other route daily at 12 noon. Use as instructed Christel Cousins, MD Taking Active   hydrOXYzine  (ATARAX ) 10 MG tablet [id number]  Take 1 tablet (10 mg total) by mouth 3 (three) times daily as needed. Isidro Margo, DO  Active   Inclisiran Sodium  (LEQVIO  [place]) [id number] No Inject into the skin. [provider] Taking Active   Lancets [facility] ULTRASOFT) lancets [id number] No Check blood sugar once daily. Dx code: 46.00 Alinda Apley, MD Taking Active Spouse/Significant Other           Med Note Ames Justin   Fri Nov 27, 2022  8:52 AM)    LORazepam  (ATIVAN ) 0.5 MG tablet [id number] No Take  0.5 mg by mouth as needed for anxiety. [provider] Taking Active Spouse/Significant Other           Med Note Jillyn Motto   Thu Aug 14, 2021  8:36 AM)    metoprolol  succinate (TOPROL -XL) 25 MG 24 hr tablet [id number] No Take 0.5 tablets (12.5 mg total) by mouth daily. Christel Cousins, MD Taking Active   omeprazole  [facility]) 20 MG capsule [id number] No TAKE 1  CAPSULE BY MOUTH EVERY DAY Christel Cousins, MD Taking Active   Semaglutide , 2 MG/DOSE, (OZEMPIC , 2 MG/DOSE,) 8 MG/3ML SOPN [id number] No Inject 2 mg into the skin once a week. Gets from pt assist Christel Cousins, MD Taking Active   tiZANidine  (ZANAFLEX ) 4 MG tablet [id number] No Take 1 tablet (4 mg total) by mouth at bedtime. Isidro Margo, DO Taking Active   traZODone  (DESYREL ) 50 MG tablet [id number] No TAKE 1/2 TO 1 TABLET BY MOUTH AT BEDTIME AS NEEDED FOR SLEEP Webb, Padonda B, FNP Taking Active   vitamin B-12 (CYANOCOBALAMIN ) 1000 MCG tablet [id number] No Take 1,000 mcg by mouth daily. [provider] Taking Active   Vitamin D , Ergocalciferol , (DRISDOL ) 1.25 MG (50000 UNIT) CAPS capsule [id number] No TAKE 1 CAPSULE BY MOUTH ONE TIME PER WEEK Isidro Margo, DO Taking Active             Home Care and Equipment/Supplies: Were Home Health Services Ordered?: NA Any new equipment or medical supplies ordered?: NA  Functional Questionnaire: Do you need assistance with bathing/showering or dressing?: No Do you need assistance with meal preparation?: No Do you need assistance with eating?: No Do you have difficulty maintaining continence: No Do you need assistance with getting out of bed/getting out of a chair/moving?: No Do you have difficulty managing or taking your medications?: No  Follow up appointments reviewed: PCP Follow-up appointment confirmed?: Yes Date of PCP follow-up appointment?: 04/18/24 Follow-up Provider: Dr. Waldo Guitar Specialist [facility] Follow-up appointment confirmed?: NA Do you need transportation to your follow-up appointment?: No Do you understand care options if your condition(s) worsen?: Yes-patient verbalized understanding    SIGNATURE  Germain Kohler, CMA (AAMA)  [facility] [phone]

## 2024-04-18 ENCOUNTER — Encounter: Payer: Self-pay | Admitting: Family Medicine

## 2024-04-18 ENCOUNTER — Ambulatory Visit: Admitting: Family Medicine

## 2024-04-18 VITALS — BP 138/72 | HR 71 | Temp 97.3°F | Resp 16 | Ht 66.0 in | Wt 132.1 lb

## 2024-04-18 DIAGNOSIS — E1165 Type 2 diabetes mellitus with hyperglycemia: Secondary | ICD-10-CM

## 2024-04-18 DIAGNOSIS — Z794 Long term (current) use of insulin: Secondary | ICD-10-CM | POA: Diagnosis not present

## 2024-04-18 DIAGNOSIS — E236 Other disorders of pituitary gland: Secondary | ICD-10-CM

## 2024-04-18 DIAGNOSIS — M5010 Cervical disc disorder with radiculopathy, unspecified cervical region: Secondary | ICD-10-CM

## 2024-04-18 DIAGNOSIS — I10 Essential (primary) hypertension: Secondary | ICD-10-CM | POA: Diagnosis not present

## 2024-04-18 NOTE — Progress Notes (Signed)
 Subjective:     Patient ID: Christine [name], female    DOB: October 26, 1951, 73 y.o.   MRN: [id number]  Chief Complaint  Patient presents with   Follow-up    Hospital Follow-up from 04/13/24 for headache and blood sugar elevated after getting steroid shot in neck Has been checking blood sugars since Thursday, has list with her    HPI Discussed the use of AI scribe software for clinical note transcription with the patient, who gave verbal consent to proceed.  History of Present Illness Christine [name] is a 73 year old female with diabetes who presents with elevated blood sugar levels following a steroid injection.  She went to the emergency room on May 1st after receiving a steroid injection in her neck, which caused her blood sugar to spike over 300 mg/dL. In the ER, she was given IV fluids to help lower her blood sugar, but no insulin  was administered due to a history of hypoglycemia. She did not experience severe symptoms such as syncope.  Following the ER visit, she was prescribed tramadol  for a headache, which she took in the ER but found it ineffective. The headache subsided on its own by the next morning. She has not experienced significant headaches since, except for one instance while cleaning with strong chemicals.  Her blood sugar readings since May 2nd have fluctuated, with values ranging from 178 mg/dL to 629 mg/dL. Her blood sugar tends to spike after consuming certain foods, such as white bread. She is attempting to manage her diet by reducing carbohydrate intake, but finds it challenging to predict the impact of different foods on her blood sugar levels.  She is using Ozempic   to manage her diabetes-had used baslagar in past. She mentions having unused insulin  pens at home, but they are expired. Aware that if gets rx, has to get 5 pens.  Her blood pressure was elevated during her ER visit, reaching up to 191 mmHg. She took an additional half dose of metoprolol , but her blood  pressure has remained in the 150-160s. She attributes some of this elevation to the steroid injection.  She is concerned about the long-term effects of elevated blood sugar levels, particularly regarding potential complications such as ketosis and dehydration. She is monitoring her blood sugar closely and adjusting her diet to manage her diabetes.    There are no preventive care reminders to display for this patient.  Past Medical History:  Diagnosis Date   Acute bursitis of right shoulder 08/29/2018   Right shoulder injected in August 29, 2018   Allergic rhinitis    Closed displaced fracture of fifth metatarsal bone of left foot 03/16/2017   DEPRESSIVE DISORDER NOT ELSEWHERE CLASSIFIED 03/27/2010   Qualifier: Diagnosis of  By: Donnice Gale MD, Sammie Crigler   [name], NP , [facility] [place]     Diabetes (HCC) 04/02/2016   Diabetes mellitus    Essential hypertension 03/14/2008   Qualifier: Diagnosis of  By: Donnice Gale MD, [name]     Fibromyalgia 08/12/2011   Diagnosed by [name] , Rheumatologist 2010    GERD (gastroesophageal reflux disease) 12/25/2016   Gluteal tendonitis of right buttock 10/19/2017   Injected in October 19, 2017 Injected October 07, 2018   Greater trochanteric bursitis of left hip 06/17/2016   Greater trochanteric bursitis of right hip 11/27/2015   Injected 11/27/2015    History of recurrent UTIs    Dr Sharran Decent   HTN (hypertension)    Hx of osteopenia  10/11/2012   [facility] DEXA; ordered by Dr Donnice Gale Lowest T score: -2.1 @ lumbar spine @ [facility] 11/01/12; decrease of 8.5% vs 05/2009. There is 9.2% risk over 10 yrs History fracture left elbow @ 4 Fractured left wrist , right elbow and nose post fall July/18/2013. Dr. Annamae Barrett No FH Osteoporosis No PMH of bisphosphonate therapy (generic Fosamax  Rxed but not filled  due to polypharmacy )    Hyperlipidemia    Hyperlipidemia associated with type 2 diabetes mellitus (HCC) 01/18/2007   Qualifier: Diagnosis of  By: Nilsa Bash  With DM LDL goal = < 100, ideally < 70. Mi in father @ 47; 2 brothers @ 57 & 51     Lumbar radiculopathy 04/11/2008   Qualifier: Diagnosis of  By: Donnice Gale MD, Ritta Chessman well to epidural 01/12/2017  repeat epidural given May 2018   Migraine headache    quiescent   Nonallopathic lesion of cervical region 07/19/2014   Nonallopathic lesion of lumbosacral region 01/15/2016   Nonallopathic lesion of sacral region 06/27/2018   Nonallopathic lesion-rib cage 09/26/2014   Palpitations 04/15/2011   Rotator cuff impingement syndrome of left shoulder 05/11/2014   Seasonal allergic rhinitis 03/21/2012   Sinusitis, chronic 04/20/2016   Sleep disorder    Dr Dohmier   Subluxation of peroneal tendon of right foot 11/10/2017   TIA (transient ischemic attack)    TRANSIENT ISCHEMIC ATTACKS, HX OF 02/02/2008   Qualifier: Diagnosis of  By: Portia Brittle ADVRS EFF UNS RX MEDICINAL&BIOLOGICAL SBSTNC 02/02/2008   Qualifier: Diagnosis of  By: Donnice Gale MD, Loralee Roche AND MYOSITIS 02/02/2008   Qualifier: Diagnosis of  By: Donnice Gale MD, [name]     UTI (urinary tract infection) 10/05/2014   Viral upper respiratory tract infection with cough 12/31/2014   Vitamin D  deficiency 05/25/2008   Qualifier: Diagnosis of  By: Donnice Gale MD, Sammie Crigler      Past Surgical History:  Procedure Laterality Date   COLONOSCOPY     Dr Willy Harvest; hemorrhoids   CORONARY ANGIOPLASTY  12/15/1995   for chest pain- negative    FOOT MASS EXCISION Left 04/08/2023   KNEE ARTHROSCOPY Left 2023   POLYPECTOMY  12/14/2000   benign, hyperplastic polyp; rectal bleeding 2008   TONSILLECTOMY     TOTAL ABDOMINAL HYSTERECTOMY  12/14/1981   BSO for endometriosis   WISDOM TOOTH EXTRACTION       Current Outpatient Medications:    acetaminophen  (TYLENOL ) 500 MG tablet, Take 500 mg by mouth as needed., Disp: , Rfl:    aspirin  81 MG tablet, Take 81 mg by mouth daily., Disp: , Rfl:    atorvastatin  (LIPITOR) 40 MG tablet,  TAKE 1 TABLET BY MOUTH EVERY DAY, Disp: 90 tablet, Rfl: 3   azelastine  (ASTELIN ) 0.1 % nasal spray, Place 2 sprays into both nostrils 2 (two) times daily. Use in each nostril as directed, Disp: 30 mL, Rfl: 12   busPIRone (BUSPAR) 5 MG tablet, Take 5 mg by mouth daily., Disp: , Rfl:    cephALEXin  (KEFLEX ) 250 MG capsule, TAKE 1 CAPSULE BY MOUTH EVERY DAY, Disp: 90 capsule, Rfl: 1   DULoxetine  (CYMBALTA ) 60 MG capsule, Take 60 mg by mouth daily. Taking 90 mg daily, Disp: , Rfl:    fluticasone  (FLONASE ) 50 MCG/ACT nasal spray, Place 2 sprays into both nostrils in the morning and at bedtime., Disp: 16 g, Rfl: 6   glucose blood (ONETOUCH ULTRA) test strip, 1 each by Other route  daily at 12 noon. Use as instructed, Disp: 100 strip, Rfl: 3   hydrOXYzine  (ATARAX ) 10 MG tablet, Take 1 tablet (10 mg total) by mouth 3 (three) times daily as needed., Disp: 30 tablet, Rfl: 0   Inclisiran Sodium  (LEQVIO  [place]), Inject into the skin., Disp: , Rfl:    Lancets (ONETOUCH ULTRASOFT) lancets, Check blood sugar once daily. Dx code: 250.00, Disp: 100 each, Rfl: 12   LORazepam  (ATIVAN ) 0.5 MG tablet, Take 0.5 mg by mouth as needed for anxiety., Disp: , Rfl:    metoprolol  succinate (TOPROL -XL) 25 MG 24 hr tablet, Take 0.5 tablets (12.5 mg total) by mouth daily., Disp: 45 tablet, Rfl: 1   omeprazole  (PRILOSEC) 20 MG capsule, TAKE 1 CAPSULE BY MOUTH EVERY DAY, Disp: 90 capsule, Rfl: 3   Semaglutide , 2 MG/DOSE, (OZEMPIC , 2 MG/DOSE,) 8 MG/3ML SOPN, Inject 2 mg into the skin once a week. Gets from pt assist, Disp: 2 mL, Rfl: 0   tiZANidine  (ZANAFLEX ) 4 MG tablet, Take 1 tablet (4 mg total) by mouth at bedtime., Disp: 90 tablet, Rfl: 1   traZODone  (DESYREL ) 50 MG tablet, TAKE 1/2 TO 1 TABLET BY MOUTH AT BEDTIME AS NEEDED FOR SLEEP, Disp: 90 tablet, Rfl: 1   vitamin B-12 (CYANOCOBALAMIN ) 1000 MCG tablet, Take 1,000 mcg by mouth daily., Disp: , Rfl:    Vitamin D , Ergocalciferol , (DRISDOL ) 1.25 MG (50000 UNIT) CAPS capsule, TAKE 1  CAPSULE BY MOUTH ONE TIME PER WEEK, Disp: 12 capsule, Rfl: 0  Allergies  Allergen Reactions   Vioxx [Rofecoxib] Other (See Comments)    Excess BP which caused TIA    Prednisone  Other (See Comments)    Mental status changes with high-dose oral agent. Tolerates epidural steroid injections. No associated rash or fever   Macrodantin  [Nitrofurantoin  Macrocrystal] Hives   Sulfa  Antibiotics     Hives   Zetia  [Ezetimibe ]     myalgias   Colesevelam Other (See Comments)    Leg Pain   ROS neg/noncontributory except as noted HPI/below      Objective:     BP 138/72 (BP Location: Left Arm, Patient Position: Sitting, Cuff Size: Normal)   Pulse 71   Temp (!) 97.3 F (36.3 C) (Temporal)   Resp 16   Ht 5\' 6"  (1.676 m)   Wt 132 lb 2 oz (59.9 kg)   LMP  (LMP Unknown)   SpO2 98%   BMI 21.33 kg/m  Wt Readings from Last 3 Encounters:  04/18/24 132 lb 2 oz (59.9 kg)  04/13/24 133 lb (60.3 kg)  04/05/24 133 lb (60.3 kg)    Physical Exam   Gen: WDWN NAD HEENT: NCAT, conjunctiva not injected, sclera nonicteric CARDIAC: RRR, S1S2+,  EXT:  no edema MSK: no gross abnormalities.  NEURO: A&O x3.  CN II-XII intact.  PSYCH: normal mood. Good eye contact  Reviewed ER     Assessment & Plan:  Type 2 diabetes mellitus with hyperglycemia, without long-term current use of insulin  (HCC)  Cervical disc disorder with radiculopathy of cervical region  Essential hypertension  Pituitary mass (HCC)  Assessment and Plan Assessment & Plan Hypertension   Hypertension has worsened due to a recent steroid injection, with blood pressure peaking at 191/80 during an ER visit, likely from steroid use. Current readings are in the 150s-160s. Steroids can elevate blood pressure and white blood cell count. She took additional metoprolol  as advised, which helped reduce blood pressure. Continue metoprolol  daily(chronic) and consider increasing frequency if blood pressure remains elevated. Reassess blood  pressure in 1-2 weeks as steroid effects diminish.  Type 2 diabetes mellitus with hyperglycemia   Type 2 diabetes is complicated by recent hyperglycemia due to a steroid injection, with blood glucose levels spiking over 300 mg/dL. Current readings range from 199 to 336 mg/dL. She is concerned about long-term hyperglycemia effects. Discussed insulin  options, including short-acting and long-acting insulin , and the potential need for insulin  if hyperglycemia persists. She prefers to avoid insulin  unless necessary, opting to monitor and manage with diet and current medications. Discussed obtaining over-the-counter insulin  from [facility] in case of emergency. Monitor blood glucose levels closely, especially if levels remain above 300 mg/dL. Consider short-acting insulin  if hyperglycemia persists or if future steroid injections are planned. Discuss insulin  options with an endocrinologist at the upcoming appointment. Maintain a low carbohydrate diet to help manage blood glucose levels. Contact the provider if blood glucose levels remain elevated or if there are concerns about insulin  use. Discussed that very short term elevations are not going to cause limb loss. Monitor  Pituitary adenoma-has appt next wk w/endo.   Cervical disc dz/HA-got steroid injection to determine how much may be causing HA. Managed by sports med    No follow-ups on file.  Ellsworth Haas, MD

## 2024-04-18 NOTE — Patient Instructions (Signed)
 [place] neurosurg-

## 2024-04-19 ENCOUNTER — Encounter: Payer: Self-pay | Admitting: Family Medicine

## 2024-04-20 ENCOUNTER — Ambulatory Visit
Admission: RE | Admit: 2024-04-20 | Discharge: 2024-04-20 | Disposition: A | Discharge status: Home / Self Care | Source: Ambulatory Visit | Attending: Family Medicine | Admitting: Family Medicine

## 2024-04-20 ENCOUNTER — Encounter: Payer: Self-pay | Admitting: Family Medicine

## 2024-04-20 DIAGNOSIS — M5010 Cervical disc disorder with radiculopathy, unspecified cervical region: Secondary | ICD-10-CM

## 2024-04-20 DIAGNOSIS — N6321 Unspecified lump in the left breast, upper outer quadrant: Secondary | ICD-10-CM | POA: Diagnosis not present

## 2024-04-20 DIAGNOSIS — M542 Cervicalgia: Secondary | ICD-10-CM

## 2024-04-20 NOTE — Progress Notes (Signed)
 Mamm-nothing seen.  Does she want to see a surgeon?

## 2024-04-21 NOTE — Progress Notes (Unsigned)
 Referring Physician:  Christel Cousins, MD [address] [place],  [place] [address]  Primary Physician:  Christel Cousins, MD  History of Present Illness: 04/21/2024 Ms. Christine [name] is here today with a chief complaint of ***  Pituitary mass   Past Surgery: ***  Christine [name] has ***no symptoms of cervical myelopathy.  The symptoms are causing a significant impact on the patient's life.   I have utilized the care everywhere function in epic to review the outside records available from external health systems.  Review of Systems:  A 10 point review of systems is negative, except for the pertinent positives and negatives detailed in the HPI.  Past Medical History: Past Medical History:  Diagnosis Date   Acute bursitis of right shoulder 08/29/2018   Right shoulder injected in August 29, 2018   Allergic rhinitis    Closed displaced fracture of fifth metatarsal bone of left foot 03/16/2017   DEPRESSIVE DISORDER NOT ELSEWHERE CLASSIFIED 03/27/2010   Qualifier: Diagnosis of  By: Donnice Gale MD, Sammie Crigler   Mr Christine Buddy, NP , [facility] [place]     Diabetes (HCC) 04/02/2016   Diabetes mellitus    Essential hypertension 03/14/2008   Qualifier: Diagnosis of  By: Donnice Gale MD, [name]     Fibromyalgia 08/12/2011   Diagnosed by [name] , Rheumatologist 2010    GERD (gastroesophageal reflux disease) 12/25/2016   Gluteal tendonitis of right buttock 10/19/2017   Injected in October 19, 2017 Injected October 07, 2018   Greater trochanteric bursitis of left hip 06/17/2016   Greater trochanteric bursitis of right hip 11/27/2015   Injected 11/27/2015    History of recurrent UTIs    [name]   HTN (hypertension)    Hx of osteopenia 10/11/2012   [facility]A; ordered by Dr Donnice Gale Lowest T score: -2.1 @ lumbar spine @ [facility] 11/01/12; decrease of 8.5% vs 05/2009. There is 9.2% risk over 10 yrs History fracture left elbow @ 4 Fractured left wrist , right elbow and nose  post fall July/18/2013. Dr. Annamae Barrett No FH Osteoporosis No PMH of bisphosphonate therapy (generic Fosamax  Rxed but not filled  due to polypharmacy )    Hyperlipidemia    Hyperlipidemia associated with type 2 diabetes mellitus (HCC) 01/18/2007   Qualifier: Diagnosis of  By: Nilsa Bash  With DM LDL goal = < 100, ideally < 70. Mi in father @ 31; 2 brothers @ 60 & 41     Lumbar radiculopathy 04/11/2008   Qualifier: Diagnosis of  By: Donnice Gale MD, Ritta Chessman well to epidural 01/12/2017  repeat epidural given May 2018   Migraine headache    quiescent   Nonallopathic lesion of cervical region 07/19/2014   Nonallopathic lesion of lumbosacral region 01/15/2016   Nonallopathic lesion of sacral region 06/27/2018   Nonallopathic lesion-rib cage 09/26/2014   Palpitations 04/15/2011   Rotator cuff impingement syndrome of left shoulder 05/11/2014   Seasonal allergic rhinitis 03/21/2012   Sinusitis, chronic 04/20/2016   Sleep disorder    Dr Dohmier   Subluxation of peroneal tendon of right foot 11/10/2017   TIA (transient ischemic attack)    TRANSIENT ISCHEMIC ATTACKS, HX OF 02/02/2008   Qualifier: Diagnosis of  By: Portia Brittle ADVRS EFF UNS RX MEDICINAL&BIOLOGICAL SBSTNC 02/02/2008   Qualifier: Diagnosis of  By: Donnice Gale MD, Loralee Roche AND MYOSITIS 02/02/2008   Qualifier: Diagnosis of  By: Donnice Gale MD,  [name]     UTI (urinary tract infection) 10/05/2014   Viral upper respiratory tract infection with cough 12/31/2014   Vitamin D  deficiency 05/25/2008   Qualifier: Diagnosis of  By: Donnice Gale MD, Sammie Crigler      Past Surgical History: Past Surgical History:  Procedure Laterality Date   COLONOSCOPY     Dr Willy Harvest; hemorrhoids   CORONARY ANGIOPLASTY  12/15/1995   for chest pain- negative    FOOT MASS EXCISION Left 04/08/2023   KNEE ARTHROSCOPY Left 2023   POLYPECTOMY  12/14/2000   benign, hyperplastic polyp; rectal bleeding 2008   TONSILLECTOMY     TOTAL ABDOMINAL HYSTERECTOMY   12/14/1981   BSO for endometriosis   WISDOM TOOTH EXTRACTION      Allergies: Allergies as of 04/24/2024 - Review Complete 04/18/2024  Allergen Reaction Noted   Vioxx [rofecoxib] Other (See Comments) 03/13/2011   Prednisone  Other (See Comments) 01/25/2007   Macrodantin  [nitrofurantoin  macrocrystal] Hives 06/09/2020   Sulfa  antibiotics  07/26/2020   Zetia  [ezetimibe ]  07/01/2023   Colesevelam Other (See Comments) 01/25/2007    Medications:  Current Outpatient Medications:    acetaminophen  (TYLENOL ) 500 MG tablet, Take 500 mg by mouth as needed., Disp: , Rfl:    aspirin  81 MG tablet, Take 81 mg by mouth daily., Disp: , Rfl:    atorvastatin  (LIPITOR) 40 MG tablet, TAKE 1 TABLET BY MOUTH EVERY DAY, Disp: 90 tablet, Rfl: 3   azelastine  (ASTELIN ) 0.1 % nasal spray, Place 2 sprays into both nostrils 2 (two) times daily. Use in each nostril as directed, Disp: 30 mL, Rfl: 12   busPIRone (BUSPAR) 5 MG tablet, Take 5 mg by mouth daily., Disp: , Rfl:    cephALEXin  (KEFLEX ) 250 MG capsule, TAKE 1 CAPSULE BY MOUTH EVERY DAY, Disp: 90 capsule, Rfl: 1   DULoxetine  (CYMBALTA ) 60 MG capsule, Take 60 mg by mouth daily. Taking 90 mg daily, Disp: , Rfl:    fluticasone  (FLONASE ) 50 MCG/ACT nasal spray, Place 2 sprays into both nostrils in the morning and at bedtime., Disp: 16 g, Rfl: 6   glucose blood (ONETOUCH ULTRA) test strip, 1 each by Other route daily at 12 noon. Use as instructed, Disp: 100 strip, Rfl: 3   hydrOXYzine  (ATARAX ) 10 MG tablet, Take 1 tablet (10 mg total) by mouth 3 (three) times daily as needed., Disp: 30 tablet, Rfl: 0   Inclisiran Sodium  (LEQVIO  [place]), Inject into the skin., Disp: , Rfl:    Lancets (ONETOUCH ULTRASOFT) lancets, Check blood sugar once daily. Dx code: 250.00, Disp: 100 each, Rfl: 12   LORazepam  (ATIVAN ) 0.5 MG tablet, Take 0.5 mg by mouth as needed for anxiety., Disp: , Rfl:    metoprolol  succinate (TOPROL -XL) 25 MG 24 hr tablet, Take 0.5 tablets (12.5 mg total) by mouth  daily., Disp: 45 tablet, Rfl: 1   omeprazole  (PRILOSEC) 20 MG capsule, TAKE 1 CAPSULE BY MOUTH EVERY DAY, Disp: 90 capsule, Rfl: 3   Semaglutide , 2 MG/DOSE, (OZEMPIC , 2 MG/DOSE,) 8 MG/3ML SOPN, Inject 2 mg into the skin once a week. Gets from pt assist, Disp: 2 mL, Rfl: 0   tiZANidine  (ZANAFLEX ) 4 MG tablet, Take 1 tablet (4 mg total) by mouth at bedtime., Disp: 90 tablet, Rfl: 1   traZODone  (DESYREL ) 50 MG tablet, TAKE 1/2 TO 1 TABLET BY MOUTH AT BEDTIME AS NEEDED FOR SLEEP, Disp: 90 tablet, Rfl: 1   vitamin B-12 (CYANOCOBALAMIN ) 1000 MCG tablet, Take 1,000 mcg by mouth daily., Disp: , Rfl:    Vitamin  D, Ergocalciferol , (DRISDOL ) 1.25 MG (50000 UNIT) CAPS capsule, TAKE 1 CAPSULE BY MOUTH ONE TIME PER WEEK, Disp: 12 capsule, Rfl: 0  Social History: Social History   Tobacco Use   Smoking status: Never   Smokeless tobacco: Never  Vaping Use   Vaping status: Never Used  Substance Use Topics   Alcohol use: No   Drug use: No    Family Medical History: Family History  Problem Relation Age of Onset   Colon cancer Maternal Aunt    Diabetes Paternal Uncle    Heart attack Father 5   COPD Mother    Lung cancer Brother        smoker   Mental illness Other        niece, committed suicide.   Heart attack Other        brother X 2; @ 50 & 52   Stroke Neg Hx     Physical Examination: There were no vitals filed for this visit.  General: Patient is in no apparent distress. Attention to examination is appropriate.  Neck:   Supple.  Full range of motion.  Respiratory: Patient is breathing without any difficulty.   NEUROLOGICAL:     Awake, alert, oriented to person, place, and time.  Speech is clear and fluent.   Cranial Nerves: Pupils equal round and reactive to light.  Facial tone is symmetric.  Facial sensation is symmetric. Shoulder shrug is symmetric. Tongue protrusion is midline.    Strength: Side Biceps Triceps Deltoid Interossei Grip Wrist Ext. Wrist Flex.  R 5 5 5 5 5 5 5    L 5 5 5 5 5 5 5    Side Iliopsoas Quads Hamstring PF DF EHL  R 5 5 5 5 5 5   L 5 5 5 5 5 5    Reflexes are ***2+ and symmetric at the biceps, triceps, brachioradialis, patella and achilles.   Hoffman's is absent. Clonus is absent  Bilateral upper and lower extremity sensation is intact to light touch ***.     No evidence of dysmetria noted.  Gait is normal.    Imaging: *** I have personally reviewed the images and agree with the above interpretation.  Medical Decision Making/Assessment and Plan: Ms. Whiten is a pleasant 73 y.o. female with ***  There are no diagnoses linked to this encounter.   Thank you for involving me in the care of this patient.    Carroll Clamp MD/MSCR Neurosurgery

## 2024-04-24 ENCOUNTER — Encounter: Payer: Self-pay | Admitting: Neurosurgery

## 2024-04-24 ENCOUNTER — Ambulatory Visit (INDEPENDENT_AMBULATORY_CARE_PROVIDER_SITE_OTHER): Admitting: Neurosurgery

## 2024-04-24 VITALS — BP 124/74 | Ht 66.0 in | Wt 132.0 lb

## 2024-04-24 DIAGNOSIS — E236 Other disorders of pituitary gland: Secondary | ICD-10-CM | POA: Diagnosis not present

## 2024-04-24 NOTE — Progress Notes (Deleted)
 Headaches ever since truama.   Mignreous all life.    Uncldar if hook effect.     Minstroke.   Does not take abortives for her migraines.

## 2024-04-25 ENCOUNTER — Ambulatory Visit: Payer: Self-pay | Admitting: *Deleted

## 2024-04-25 DIAGNOSIS — E7800 Pure hypercholesterolemia, unspecified: Secondary | ICD-10-CM | POA: Diagnosis not present

## 2024-04-25 DIAGNOSIS — N6321 Unspecified lump in the left breast, upper outer quadrant: Secondary | ICD-10-CM

## 2024-04-25 DIAGNOSIS — I10 Essential (primary) hypertension: Secondary | ICD-10-CM | POA: Diagnosis not present

## 2024-04-25 DIAGNOSIS — E1165 Type 2 diabetes mellitus with hyperglycemia: Secondary | ICD-10-CM | POA: Diagnosis not present

## 2024-04-25 DIAGNOSIS — D352 Benign neoplasm of pituitary gland: Secondary | ICD-10-CM | POA: Diagnosis not present

## 2024-04-26 ENCOUNTER — Other Ambulatory Visit: Payer: Self-pay | Admitting: Family Medicine

## 2024-05-01 ENCOUNTER — Telehealth: Payer: Self-pay | Admitting: *Deleted

## 2024-05-01 NOTE — Telephone Encounter (Signed)
 Copied from CRM [id number]. Topic: Referral - Status >> May 01, 2024  3:01 PM Abigail D wrote: Reason for CRM: Lenon Radar with [facility] calling for information regarding referral for mass of upper outer quadrant of left breast. She states that according to mammogram nothing was found and is confirming if she needs to be seen.

## 2024-05-01 NOTE — Telephone Encounter (Signed)
 Confirmed with [facility] that patient still need appointment per pcp.

## 2024-05-05 DIAGNOSIS — F410 Panic disorder [episodic paroxysmal anxiety] without agoraphobia: Secondary | ICD-10-CM | POA: Diagnosis not present

## 2024-05-11 NOTE — Progress Notes (Unsigned)
 [name] [address] [place] [address] Phone: [phone] Subjective:   Christine [name], am serving as a scribe for Dr. Ronnell Coins.  I'm seeing this patient by the request  of:  Christel Cousins, MD  CC: Neck pain follow-up  Christine [name]  Christine [name] is a 73 y.o. female coming in with complaint of back and neck pain. OMT 04/05/2024. Referred to Dr. Jeris Montes. Patient states epidural didn't really work, but blood sugar spiked up to 330s.   Medications patient has been prescribed: Zanaflex , Vit D  Taking:         Reviewed prior external information including notes and imaging from previsou exam, outside providers and external EMR if available.   As well as notes that were available from care everywhere and other healthcare systems.  Past medical history, social, surgical and family history all reviewed in electronic medical record.  No pertanent information unless stated regarding to the chief complaint.   Past Medical History:  Diagnosis Date   Acute bursitis of right shoulder 08/29/2018   Right shoulder injected in August 29, 2018   Allergic rhinitis    Closed displaced fracture of fifth metatarsal bone of left foot 03/16/2017   DEPRESSIVE DISORDER NOT ELSEWHERE CLASSIFIED 03/27/2010   Qualifier: Diagnosis of  By: Donnice Gale MD, Sammie Crigler   [name], NP , [facility] [place]     Diabetes (HCC) 04/02/2016   Diabetes mellitus    Essential hypertension 03/14/2008   Qualifier: Diagnosis of  By: Donnice Gale MD, [name]     Fibromyalgia 08/12/2011   Diagnosed by [name] , Rheumatologist 2010    GERD (gastroesophageal reflux disease) 12/25/2016   Gluteal tendonitis of right buttock 10/19/2017   Injected in October 19, 2017 Injected October 07, 2018   Greater trochanteric bursitis of left hip 06/17/2016   Greater trochanteric bursitis of right hip 11/27/2015   Injected 11/27/2015    History of recurrent UTIs    Dr  McDiamid   HTN (hypertension)    Hx of osteopenia 10/11/2012   [facility] D[facility]A; ordered by Dr Donnice Gale Lowest T score: -2.1 @ lumbar spine @ [facility] 11/01/12; decrease of 8.5% vs 05/2009. There is 9.2% risk over 10 yrs History fracture left elbow @ 4 Fractured left wrist , right elbow and nose post fall July/18/2013. Dr. Annamae Barrett No FH Osteoporosis No PMH of bisphosphonate therapy (generic Fosamax  Rxed but not filled  due to polypharmacy )    Hyperlipidemia    Hyperlipidemia associated with type 2 diabetes mellitus (HCC) 01/18/2007   Qualifier: Diagnosis of  By: Nilsa Bash  With DM LDL goal = < 100, ideally < 70. Mi in father @ 66; 2 brothers @ 33 & 27     Lumbar radiculopathy 04/11/2008   Qualifier: Diagnosis of  By: Donnice Gale MD, Ritta Chessman well to epidural 01/12/2017  repeat epidural given May 2018   Migraine headache    quiescent   Nonallopathic lesion of cervical region 07/19/2014   Nonallopathic lesion of lumbosacral region 01/15/2016   Nonallopathic lesion of sacral region 06/27/2018   Nonallopathic lesion-rib cage 09/26/2014   Palpitations 04/15/2011   Rotator cuff impingement syndrome of left shoulder 05/11/2014   Seasonal allergic rhinitis 03/21/2012   Sinusitis, chronic 04/20/2016   Sleep disorder    Dr Dohmier   Subluxation of peroneal tendon of right foot 11/10/2017   TIA (transient ischemic attack)    TRANSIENT ISCHEMIC ATTACKS, HX OF 02/02/2008  Qualifier: Diagnosis of  By: Portia Brittle ADVRS EFF UNS RX MEDICINAL&BIOLOGICAL SBSTNC 02/02/2008   Qualifier: Diagnosis of  By: Donnice Gale MD, Loralee Roche AND MYOSITIS 02/02/2008   Qualifier: Diagnosis of  By: Donnice Gale MD, Sammie Crigler     UTI (urinary tract infection) 10/05/2014   Viral upper respiratory tract infection with cough 12/31/2014   Vitamin D  deficiency 05/25/2008   Qualifier: Diagnosis of  By: Donnice Gale MD, Sammie Crigler      Allergies  Allergen Reactions   Vioxx [Rofecoxib] Other (See Comments)    Excess BP which  caused TIA    Prednisone  Other (See Comments)    Mental status changes with high-dose oral agent. Tolerates epidural steroid injections. No associated rash or fever   Macrodantin  [Nitrofurantoin  Macrocrystal] Hives   Sulfa  Antibiotics     Hives   Zetia  [Ezetimibe ]     myalgias   Colesevelam Other (See Comments)    Leg Pain     Review of Systems:  No , visual changes, nausea, vomiting, diarrhea, constipation, dizziness, abdominal pain, skin rash, fevers, chills, night sweats, weight loss, swollen lymph nodes, body aches, joint swelling, chest pain, shortness of breath, mood changes. POSITIVE muscle aches headache  Objective  There were no vitals taken for this visit.   General: No apparent distress alert and oriented x3 mood and affect normal, dressed appropriately.  HEENT: Pupils equal, extraocular movements intact no nystagmus noted Respiratory: Patient's speak in full sentences and does not appear short of breath  Cardiovascular: No lower extremity edema, non tender, no erythema  Neck exam does have some loss lordosis.  Some tenderness to palpation in the paraspinal musculature.  Some worsening pain with extension but no dizziness and negative Spurling's.  Osteopathic findings  C2 flexed rotated and side bent right C6 flexed rotated and side bent left T3 extended rotated and side bent right inhaled rib T9 extended rotated and side bent left L2 flexed rotated and side bent right L3 flexed rotated and side bent left Sacrum right on right       Assessment and Plan:  No problem-specific Assessment & Plan notes found for this encounter.    Nonallopathic problems  Decision today to treat with OMT was based on Physical Exam  After verbal consent patient was treated with HVLA, ME, FPR techniques in cervical, rib, thoracic, lumbar, and sacral  areas  Patient tolerated the procedure well with improvement in symptoms  Patient given exercises, stretches and lifestyle  modifications  See medications in patient instructions if given  Patient will follow up in 4-8 weeks     The above documentation has been reviewed and is accurate and complete Christine Nolton M Lorenso Quirino, DO         Note: This dictation was prepared with Dragon dictation along with smaller phrase technology. Any transcriptional errors that result from this process are unintentional.

## 2024-05-12 ENCOUNTER — Encounter: Payer: Self-pay | Admitting: Family Medicine

## 2024-05-12 ENCOUNTER — Ambulatory Visit: Admitting: Family Medicine

## 2024-05-12 VITALS — BP 116/82 | HR 95 | Ht 66.0 in | Wt 131.0 lb

## 2024-05-12 DIAGNOSIS — M5010 Cervical disc disorder with radiculopathy, unspecified cervical region: Secondary | ICD-10-CM

## 2024-05-12 DIAGNOSIS — M9903 Segmental and somatic dysfunction of lumbar region: Secondary | ICD-10-CM

## 2024-05-12 DIAGNOSIS — M9901 Segmental and somatic dysfunction of cervical region: Secondary | ICD-10-CM | POA: Diagnosis not present

## 2024-05-12 DIAGNOSIS — M9904 Segmental and somatic dysfunction of sacral region: Secondary | ICD-10-CM

## 2024-05-12 DIAGNOSIS — M9908 Segmental and somatic dysfunction of rib cage: Secondary | ICD-10-CM | POA: Diagnosis not present

## 2024-05-12 DIAGNOSIS — E538 Deficiency of other specified B group vitamins: Secondary | ICD-10-CM | POA: Diagnosis not present

## 2024-05-12 DIAGNOSIS — M9902 Segmental and somatic dysfunction of thoracic region: Secondary | ICD-10-CM

## 2024-05-12 MED ORDER — CYANOCOBALAMIN 1000 MCG/ML IJ SOLN
1000.0000 ug | Freq: Once | INTRAMUSCULAR | Status: AC
Start: 2024-05-12 — End: 2024-05-12
  Administered 2024-05-12: 1000 ug via INTRAMUSCULAR

## 2024-05-12 NOTE — Assessment & Plan Note (Signed)
 Injection noted

## 2024-05-12 NOTE — Patient Instructions (Addendum)
 Good to see you!  B12 injection today. Hoping we'll get to bottom of this soon See if [facility] will recheck PTH, ionized calcium , and vit d See you again in 6 weeks

## 2024-05-12 NOTE — Assessment & Plan Note (Signed)
 Patient does have arthritic changes noted.  Will concerned that we are still having difficulty with the hypercalcemia and the potential for the pituitary mass.  Patient has followed up with specialist who likely are going to consider monitoring and repeat imaging at a later date.  Discussed with patient about icing regimen and home exercises.  Discussed which activities to do and which ones to avoid.  Increase activity slowly.  Discussed icing regimen.  Follow-up again in 6 to 8 weeks otherwise.

## 2024-05-16 DIAGNOSIS — D352 Benign neoplasm of pituitary gland: Secondary | ICD-10-CM | POA: Diagnosis not present

## 2024-05-25 ENCOUNTER — Ambulatory Visit: Payer: Medicare Other | Admitting: *Deleted

## 2024-05-25 VITALS — BP 145/74 | HR 86 | Temp 98.1°F | Resp 18 | Ht 65.0 in | Wt 131.6 lb

## 2024-05-25 DIAGNOSIS — E1169 Type 2 diabetes mellitus with other specified complication: Secondary | ICD-10-CM | POA: Diagnosis not present

## 2024-05-25 DIAGNOSIS — E785 Hyperlipidemia, unspecified: Secondary | ICD-10-CM

## 2024-05-25 DIAGNOSIS — I2510 Atherosclerotic heart disease of native coronary artery without angina pectoris: Secondary | ICD-10-CM

## 2024-05-25 DIAGNOSIS — E7800 Pure hypercholesterolemia, unspecified: Secondary | ICD-10-CM | POA: Diagnosis not present

## 2024-05-25 MED ORDER — INCLISIRAN SODIUM 284 MG/1.5ML ~~LOC~~ SOSY
284.0000 mg | PREFILLED_SYRINGE | Freq: Once | SUBCUTANEOUS | Status: AC
  Administered 2024-05-25: 284 mg via SUBCUTANEOUS
  Filled 2024-05-25: qty 1.5

## 2024-05-25 NOTE — Progress Notes (Signed)
 Diagnosis: Hyperlipidemia  Provider:  Mannam, Praveen MD  Procedure: Injection  Leqvio  (inclisiran), Dose: 284 mg, Site: subcutaneous, Number of injections: 1  Injection Site(s): Right arm  Post Care: Patient declined observation  Discharge: Condition: Good, Destination: Home . AVS Declined  Performed by:  Devonne Folk, RN

## 2024-05-26 ENCOUNTER — Telehealth: Payer: Self-pay | Admitting: *Deleted

## 2024-05-26 NOTE — Telephone Encounter (Signed)
 Called patient to inform her that her Ozempic  was delivered to the office. Patient stated she would pick up on Monday.

## 2024-06-12 DIAGNOSIS — N6321 Unspecified lump in the left breast, upper outer quadrant: Secondary | ICD-10-CM | POA: Insufficient documentation

## 2024-06-12 DIAGNOSIS — N644 Mastodynia: Secondary | ICD-10-CM | POA: Insufficient documentation

## 2024-06-12 DIAGNOSIS — R928 Other abnormal and inconclusive findings on diagnostic imaging of breast: Secondary | ICD-10-CM | POA: Diagnosis not present

## 2024-06-12 DIAGNOSIS — D4862 Neoplasm of uncertain behavior of left breast: Secondary | ICD-10-CM | POA: Diagnosis not present

## 2024-06-12 DIAGNOSIS — R9230 Dense breasts, unspecified: Secondary | ICD-10-CM | POA: Insufficient documentation

## 2024-06-13 ENCOUNTER — Other Ambulatory Visit: Payer: Self-pay | Admitting: General Surgery

## 2024-06-13 DIAGNOSIS — R9230 Dense breasts, unspecified: Secondary | ICD-10-CM

## 2024-06-13 DIAGNOSIS — N6321 Unspecified lump in the left breast, upper outer quadrant: Secondary | ICD-10-CM

## 2024-06-13 DIAGNOSIS — N644 Mastodynia: Secondary | ICD-10-CM

## 2024-06-15 ENCOUNTER — Other Ambulatory Visit: Payer: Self-pay | Admitting: Family Medicine

## 2024-06-15 DIAGNOSIS — E1165 Type 2 diabetes mellitus with hyperglycemia: Secondary | ICD-10-CM

## 2024-06-15 NOTE — Progress Notes (Signed)
 [name] [address] [place] [address] Phone: [phone] Subjective:   Christine [name], am serving as a scribe for Dr. Arthea Claudene.  I'm seeing this patient by the request  of:  [name], MD  CC: Back and neck pain follow-up  YEP:Dlagzrupcz  [name] is a 73 y.o. female coming in with complaint of back and neck pain. OMT 05/12/2024. Patient states that she is the same as last visit.  Mild increase in frequency of headache  Saw endocrinology through [facility].   Medications patient has been prescribed: Zanaflex , Vit D  Taking:         Reviewed prior external information including notes and imaging from previsou exam, outside providers and external EMR if available.   As well as notes that were available from care everywhere and other healthcare systems.  Past medical history, social, surgical and family history all reviewed in electronic medical record.  No pertanent information unless stated regarding to the chief complaint.   Past Medical History:  Diagnosis Date   Acute bursitis of right shoulder 08/29/2018   Right shoulder injected in August 29, 2018   Allergic rhinitis    Closed displaced fracture of fifth metatarsal bone of left foot 03/16/2017   DEPRESSIVE DISORDER NOT ELSEWHERE CLASSIFIED 03/27/2010   Qualifier: Diagnosis of  By: [name], [name]   Christine Redell Hoof, NP , [facility] [place]     Diabetes (HCC) 04/02/2016   Diabetes mellitus    Essential hypertension 03/14/2008   Qualifier: Diagnosis of  By: [name], [name]     Fibromyalgia 08/12/2011   Diagnosed by [name] , Rheumatologist 2010    GERD (gastroesophageal reflux disease) 12/25/2016   Gluteal tendonitis of right buttock 10/19/2017   Injected in October 19, 2017 Injected October 07, 2018   Greater trochanteric bursitis of left hip 06/17/2016   Greater trochanteric bursitis of right hip 11/27/2015   Injected  11/27/2015    History of recurrent UTIs    [name]   HTN (hypertension)    Hx of osteopenia 10/11/2012   [facility] D[facility]A; ordered by [name] Lowest T score: -2.1 @ lumbar spine @ [facility] 11/01/12; decrease of 8.5% vs 05/2009. There is 9.2% risk over 10 yrs History fracture left elbow @ 4 Fractured left wrist , right elbow and nose post fall July/18/2013. Dr. [name] No FH Osteoporosis No PMH of bisphosphonate therapy (generic Fosamax  Rxed but not filled  due to polypharmacy )    Hyperlipidemia    Hyperlipidemia associated with type 2 diabetes mellitus (HCC) 01/18/2007   Qualifier: Diagnosis of  By: [name]  With DM LDL goal = < 100, ideally < 70. Mi in father @ 75; 2 brothers @ 75 & 53     Lumbar radiculopathy 04/11/2008   Qualifier: Diagnosis of  By: [name], [name] Dock well to epidural 01/12/2017  repeat epidural given May 2018   Migraine headache    quiescent   Nonallopathic lesion of cervical region 07/19/2014   Nonallopathic lesion of lumbosacral region 01/15/2016   Nonallopathic lesion of sacral region 06/27/2018   Nonallopathic lesion-rib cage 09/26/2014   Palpitations 04/15/2011   Rotator cuff impingement syndrome of left shoulder 05/11/2014   Seasonal allergic rhinitis 03/21/2012   Sinusitis, chronic 04/20/2016   Sleep disorder    Dr Dohmier   Subluxation of peroneal tendon of right foot 11/10/2017   TIA (transient ischemic attack)  TRANSIENT ISCHEMIC ATTACKS, HX OF 02/02/2008   Qualifier: Diagnosis of  By: Claudene GUSS Heron SERITA ADVRS EFF UNS RX MEDICINAL&BIOLOGICAL SBSTNC 02/02/2008   Qualifier: Diagnosis of  By: [name], [name] [name] AND MYOSITIS 02/02/2008   Qualifier: Diagnosis of  By: [name], [name]     UTI (urinary tract infection) 10/05/2014   Viral upper respiratory tract infection with cough 12/31/2014   Vitamin D  deficiency 05/25/2008   Qualifier: Diagnosis of  By: [name], [name]      Allergies  Allergen Reactions   Vioxx  [Rofecoxib] Other (See Comments)    Excess BP which caused TIA    Prednisone  Other (See Comments)    Mental status changes with high-dose oral agent. Tolerates epidural steroid injections. No associated rash or fever   Macrodantin  [Nitrofurantoin  Macrocrystal] Hives   Sulfa  Antibiotics     Hives   Zetia  [Ezetimibe ]     myalgias   Colesevelam Other (See Comments)    Leg Pain     Review of Systems:  No  visual changes, nausea, vomiting, diarrhea, constipation, dizziness, abdominal pain, skin rash, fevers, chills, night sweats, weight loss, swollen lymph nodes, body aches, joint swelling, chest pain, shortness of breath, mood changes. POSITIVE muscle aches, headache  Objective  Blood pressure 124/82, height 5' 5 (1.651 m), weight 131 lb (59.4 kg).   General: No apparent distress alert and oriented x3 mood and affect normal, dressed appropriately.  HEENT: Pupils equal, extraocular movements intact  Respiratory: Patient's speak in full sentences and does not appear short of breath  Cardiovascular: No lower extremity edema, non tender, no erythema  Gait MSK:  Back mild loss lordosis.  Tenderness to palpation in the paraspinal musculature.  Neck exam does have some limited range of motion with sidebending.  Osteopathic findings  C2 flexed rotated and side bent right C3 flexed rotated and side bent left C6 flexed rotated and side bent left T3 extended rotated and side bent right inhaled rib T9 extended rotated and side bent left L2 flexed rotated and side bent right L3 flexed rotated and side bent left Sacrum right on right       Assessment and Plan:  Pituitary mass (HCC) Recommendation is to potentially repeat MRI in 1 year.  Will consider if patient's prolactin continues to elevate to repeat imaging in 6 to 8 months.  Patient is being followed by endocrinology seen primary MD next week  Fibromyalgia Appears the patient is having more of a fibromyalgia flare at the  moment.  Increasing discomfort and pain.  Discussed with patient could consider other medications or Toradol  and Depo-Medrol  injections.  Patient wants to hold at this time.  Responded relatively well to osteopathic manipulation.  Follow-up with me again in 6 to 8 weeks.    Nonallopathic problems  Decision today to treat with OMT was based on Physical Exam  After verbal consent patient was treated with HVLA, ME, FPR techniques in cervical, rib, thoracic, lumbar, and sacral  areas  Patient tolerated the procedure well with improvement in symptoms  Patient given exercises, stretches and lifestyle modifications  See medications in patient instructions if given  Patient will follow up in 4-8 weeks    The above documentation has been reviewed and is accurate and complete Jaye Saal M Edgardo Petrenko, DO          Note: This dictation was prepared with Dragon dictation along with smaller phrase technology. Any transcriptional errors that result  from this process are unintentional.

## 2024-06-17 ENCOUNTER — Other Ambulatory Visit: Payer: Self-pay | Admitting: Family Medicine

## 2024-06-27 ENCOUNTER — Encounter: Payer: Self-pay | Admitting: Family Medicine

## 2024-06-27 ENCOUNTER — Ambulatory Visit: Admitting: Family Medicine

## 2024-06-27 VITALS — BP 124/82 | Ht 65.0 in | Wt 131.0 lb

## 2024-06-27 DIAGNOSIS — E236 Other disorders of pituitary gland: Secondary | ICD-10-CM

## 2024-06-27 DIAGNOSIS — M797 Fibromyalgia: Secondary | ICD-10-CM | POA: Diagnosis not present

## 2024-06-27 DIAGNOSIS — M9904 Segmental and somatic dysfunction of sacral region: Secondary | ICD-10-CM

## 2024-06-27 DIAGNOSIS — M9908 Segmental and somatic dysfunction of rib cage: Secondary | ICD-10-CM | POA: Diagnosis not present

## 2024-06-27 DIAGNOSIS — M9902 Segmental and somatic dysfunction of thoracic region: Secondary | ICD-10-CM

## 2024-06-27 DIAGNOSIS — M9901 Segmental and somatic dysfunction of cervical region: Secondary | ICD-10-CM

## 2024-06-27 DIAGNOSIS — M9903 Segmental and somatic dysfunction of lumbar region: Secondary | ICD-10-CM

## 2024-06-27 NOTE — Patient Instructions (Signed)
 Good to see you Glad you had a great family visit Ask primary if they will check prolactin with your labs I like your doc vacation in August See me in September

## 2024-06-27 NOTE — Assessment & Plan Note (Signed)
 Recommendation is to potentially repeat MRI in 1 year.  Will consider if patient's prolactin continues to elevate to repeat imaging in 6 to 8 months.  Patient is being followed by endocrinology seen primary MD next week

## 2024-06-27 NOTE — Assessment & Plan Note (Signed)
 Appears the patient is having more of a fibromyalgia flare at the moment.  Increasing discomfort and pain.  Discussed with patient could consider other medications or Toradol  and Depo-Medrol  injections.  Patient wants to hold at this time.  Responded relatively well to osteopathic manipulation.  Follow-up with me again in 6 to 8 weeks.

## 2024-06-30 ENCOUNTER — Ambulatory Visit: Payer: Self-pay | Admitting: Family Medicine

## 2024-06-30 ENCOUNTER — Ambulatory Visit (INDEPENDENT_AMBULATORY_CARE_PROVIDER_SITE_OTHER): Admitting: Family Medicine

## 2024-06-30 ENCOUNTER — Encounter: Payer: Self-pay | Admitting: Family Medicine

## 2024-06-30 VITALS — BP 123/81 | HR 75 | Temp 97.5°F | Resp 16 | Ht 65.0 in | Wt 130.4 lb

## 2024-06-30 DIAGNOSIS — E1165 Type 2 diabetes mellitus with hyperglycemia: Secondary | ICD-10-CM

## 2024-06-30 DIAGNOSIS — E1169 Type 2 diabetes mellitus with other specified complication: Secondary | ICD-10-CM

## 2024-06-30 DIAGNOSIS — E236 Other disorders of pituitary gland: Secondary | ICD-10-CM

## 2024-06-30 DIAGNOSIS — I10 Essential (primary) hypertension: Secondary | ICD-10-CM | POA: Diagnosis not present

## 2024-06-30 DIAGNOSIS — E785 Hyperlipidemia, unspecified: Secondary | ICD-10-CM

## 2024-06-30 DIAGNOSIS — K219 Gastro-esophageal reflux disease without esophagitis: Secondary | ICD-10-CM | POA: Diagnosis not present

## 2024-06-30 DIAGNOSIS — Z794 Long term (current) use of insulin: Secondary | ICD-10-CM

## 2024-06-30 DIAGNOSIS — G43009 Migraine without aura, not intractable, without status migrainosus: Secondary | ICD-10-CM

## 2024-06-30 DIAGNOSIS — R350 Frequency of micturition: Secondary | ICD-10-CM

## 2024-06-30 LAB — CBC WITH DIFFERENTIAL/PLATELET
Basophils Absolute: 0.1 K/uL (ref 0.0–0.1)
Basophils Relative: 1.2 % (ref 0.0–3.0)
Eosinophils Absolute: 0.1 K/uL (ref 0.0–0.7)
Eosinophils Relative: 1.9 % (ref 0.0–5.0)
HCT: 40.9 % (ref 36.0–46.0)
Hemoglobin: 13.8 g/dL (ref 12.0–15.0)
Lymphocytes Relative: 32.4 % (ref 12.0–46.0)
Lymphs Abs: 1.5 K/uL (ref 0.7–4.0)
MCHC: 33.8 g/dL (ref 30.0–36.0)
MCV: 90.5 fl (ref 78.0–100.0)
Monocytes Absolute: 0.4 K/uL (ref 0.1–1.0)
Monocytes Relative: 9.2 % (ref 3.0–12.0)
Neutro Abs: 2.6 K/uL (ref 1.4–7.7)
Neutrophils Relative %: 55.3 % (ref 43.0–77.0)
Platelets: 265 K/uL (ref 150.0–400.0)
RBC: 4.52 Mil/uL (ref 3.87–5.11)
RDW: 13.6 % (ref 11.5–15.5)
WBC: 4.7 K/uL (ref 4.0–10.5)

## 2024-06-30 LAB — HEMOGLOBIN A1C: Hgb A1c MFr Bld: 7.5 % — ABNORMAL HIGH (ref 4.6–6.5)

## 2024-06-30 LAB — URINALYSIS, ROUTINE W REFLEX MICROSCOPIC
Bilirubin Urine: NEGATIVE
Leukocytes,Ua: NEGATIVE
Nitrite: NEGATIVE
Specific Gravity, Urine: 1.02 (ref 1.000–1.030)
Urine Glucose: NEGATIVE
Urobilinogen, UA: 1 (ref 0.0–1.0)
pH: 6 (ref 5.0–8.0)

## 2024-06-30 LAB — COMPREHENSIVE METABOLIC PANEL WITH GFR
ALT: 30 U/L (ref 0–35)
AST: 24 U/L (ref 0–37)
Albumin: 4.5 g/dL (ref 3.5–5.2)
Alkaline Phosphatase: 74 U/L (ref 39–117)
BUN: 16 mg/dL (ref 6–23)
CO2: 29 meq/L (ref 19–32)
Calcium: 9.6 mg/dL (ref 8.4–10.5)
Chloride: 101 meq/L (ref 96–112)
Creatinine, Ser: 0.74 mg/dL (ref 0.40–1.20)
GFR: 79.34 mL/min (ref >60.00)
Glucose, Bld: 146 mg/dL — ABNORMAL HIGH (ref 70–99)
Potassium: 3.5 meq/L (ref 3.5–5.1)
Sodium: 138 meq/L (ref 135–145)
Total Bilirubin: 0.6 mg/dL (ref 0.2–1.2)
Total Protein: 7.1 g/dL (ref 6.0–8.3)

## 2024-06-30 LAB — LIPID PANEL
Cholesterol: 146 mg/dL (ref 0–200)
HDL: 67.1 mg/dL (ref >39.00)
LDL Cholesterol: 61 mg/dL (ref 0–99)
NonHDL: 79.18
Total CHOL/HDL Ratio: 2
Triglycerides: 89 mg/dL (ref 0.0–149.0)
VLDL: 17.8 mg/dL (ref 0.0–40.0)

## 2024-06-30 LAB — MICROALBUMIN / CREATININE URINE RATIO
Creatinine,U: 140.1 mg/dL
Microalb Creat Ratio: 54.4 mg/g — ABNORMAL HIGH (ref 0.0–30.0)
Microalb, Ur: 7.6 mg/dL — ABNORMAL HIGH (ref 0.0–1.9)

## 2024-06-30 MED ORDER — OMEPRAZOLE 20 MG PO CPDR: DELAYED_RELEASE_CAPSULE | ORAL | 3 refills | Status: AC

## 2024-06-30 MED ORDER — ONETOUCH ULTRASOFT LANCETS MISC: 12 refills | Status: AC

## 2024-06-30 MED ORDER — ATORVASTATIN CALCIUM 40 MG PO TABS: 40.0000 mg | ORAL_TABLET | Freq: Every day | ORAL | 3 refills | Status: AC

## 2024-06-30 MED ORDER — METOPROLOL SUCCINATE ER 25 MG PO TB24: 12.5000 mg | ORAL_TABLET | Freq: Every day | ORAL | 1 refills | Status: AC

## 2024-06-30 MED ORDER — ONETOUCH ULTRA VI STRP: 1.0000 | ORAL_STRIP | Freq: Two times a day (BID) | 3 refills | Status: DC

## 2024-06-30 NOTE — Progress Notes (Signed)
 Subjective:     Patient ID: Christine [name], female    DOB: 1951-06-22, 73 y.o.   MRN: [id number]  Chief Complaint  Patient presents with   Medical Management of Chronic Issues    3 month follow-up Fasting     HPI Discussed the use of AI scribe software for clinical note transcription with the patient, who gave verbal consent to proceed.  History of Present Illness Christine [name] is a 73 year old female with pituitary adenoma and diabetes who presents for follow-up on her medical conditions.  Her endocrinologist noted an increase in prolactin levels. She completed a 24-hour urine test but has not followed up due to being busy with family visits. She continues to experience headaches and dizziness, which she manages with Tylenol  and rest. Nurtec-cost prohibitive.  Was told the HA not from the adenoma  She is concerned about her blood sugar levels, which range from 130 to 150 mg/dL in the morning, with occasional lows of 103 mg/dL. She is currently on Ozempic  2 mg weekly but feels it is not adequately controlling her blood sugar. Despite this, she continues to lose weight, having lost four pounds in a month, and is concerned about further weight loss. She checks her blood sugar once daily. She has previously been on metformin  and Lantus  for diabetes management.  She experiences frequent urination and increased urinary leakage, which has worsened over the past month. She wears pads for protection and has a history of urinary tract infections, for which she takes cephalexin  250 mg daily for prevention. She reports occasional burning and pain, and feels that the cephalexin  is helping to keep her symptoms under control.  She is taking atorvastatin  40 mg for cholesterol, metoprolol  25 mg (half a tablet daily) for blood pressure, omeprazole  for heartburn, and duloxetine  60 mg and 30 mg for mood. She uses Flonase  seasonally for allergies and receives Leqvio  injections for LDL cholesterol every  six months. She also takes vitamin D  and B12 supplements.  She has a history of anxiety and was previously on Buspar, which she discontinued due to lack of efficacy. She occasionally uses lorazepam  as needed. She also takes tizanidine  for muscle spasms and trazodone  25 mg (half a tablet) for sleep, though she has an excess supply and does not need a refill.    There are no preventive care reminders to display for this patient.   Past Medical History:  Diagnosis Date   Acute bursitis of right shoulder 08/29/2018   Right shoulder injected in August 29, 2018   Allergic rhinitis    Closed displaced fracture of fifth metatarsal bone of left foot 03/16/2017   DEPRESSIVE DISORDER NOT ELSEWHERE CLASSIFIED 03/27/2010   Qualifier: Diagnosis of  By: [name], [name]   [name], NP , [facility] [place]     Diabetes (HCC) 04/02/2016   Diabetes mellitus    Essential hypertension 03/14/2008   Qualifier: Diagnosis of  By: [name], [name]     Fibromyalgia 08/12/2011   Diagnosed by [name] , Rheumatologist 2010    GERD (gastroesophageal reflux disease) 12/25/2016   Gluteal tendonitis of right buttock 10/19/2017   Injected in October 19, 2017 Injected October 07, 2018   Greater trochanteric bursitis of left hip 06/17/2016   Greater trochanteric bursitis of right hip 11/27/2015   Injected 11/27/2015    History of recurrent UTIs    [name]   HTN (hypertension)    Hx of osteopenia 10/11/2012  [facility] DEXA; ordered by [name] Lowest T score: -2.1 @ lumbar spine @ [facility] 11/01/12; decrease of 8.5% vs 05/2009. There is 9.2% risk over 10 yrs History fracture left elbow @ 4 Fractured left wrist , right elbow and nose post fall July/18/2013. Dr. [name] No FH Osteoporosis No PMH of bisphosphonate therapy (generic Fosamax  Rxed but not filled  due to polypharmacy )    Hyperlipidemia    Hyperlipidemia associated with type 2 diabetes mellitus (HCC) 01/18/2007   Qualifier: Diagnosis of   By: [name]  With DM LDL goal = < 100, ideally < 70. Mi in father @ 28; 2 brothers @ 32 & 26     Lumbar radiculopathy 04/11/2008   Qualifier: Diagnosis of  By: [name], [name] Dock well to epidural 01/12/2017  repeat epidural given May 2018   Migraine headache    quiescent   Nonallopathic lesion of cervical region 07/19/2014   Nonallopathic lesion of lumbosacral region 01/15/2016   Nonallopathic lesion of sacral region 06/27/2018   Nonallopathic lesion-rib cage 09/26/2014   Palpitations 04/15/2011   Rotator cuff impingement syndrome of left shoulder 05/11/2014   Seasonal allergic rhinitis 03/21/2012   Sinusitis, chronic 04/20/2016   Sleep disorder    Dr Dohmier   Subluxation of peroneal tendon of right foot 11/10/2017   TIA (transient ischemic attack)    TRANSIENT ISCHEMIC ATTACKS, HX OF 02/02/2008   Qualifier: Diagnosis of  By: Claudene GUSS Heron SERITA ADVRS EFF UNS RX MEDICINAL&BIOLOGICAL SBSTNC 02/02/2008   Qualifier: Diagnosis of  By: [name], [name] [name] AND MYOSITIS 02/02/2008   Qualifier: Diagnosis of  By: [name], [name]     UTI (urinary tract infection) 10/05/2014   Viral upper respiratory tract infection with cough 12/31/2014   Vitamin D  deficiency 05/25/2008   Qualifier: Diagnosis of  By: [name], [name]      Past Surgical History:  Procedure Laterality Date   COLONOSCOPY     Dr Avram; hemorrhoids   CORONARY ANGIOPLASTY  12/15/1995   for chest pain- negative    FOOT MASS EXCISION Left 04/08/2023   KNEE ARTHROSCOPY Left 2023   POLYPECTOMY  12/14/2000   benign, hyperplastic polyp; rectal bleeding 2008   TONSILLECTOMY     TOTAL ABDOMINAL HYSTERECTOMY  12/14/1981   BSO for endometriosis   WISDOM TOOTH EXTRACTION       Current Outpatient Medications:    acetaminophen  (TYLENOL ) 500 MG tablet, Take 500 mg by mouth as needed., Disp: , Rfl:    aspirin  81 MG tablet, Take 81 mg by mouth daily., Disp: , Rfl:    cephALEXin  (KEFLEX ) 250 MG  capsule, TAKE 1 CAPSULE BY MOUTH EVERY DAY, Disp: 90 capsule, Rfl: 1   DULoxetine  (CYMBALTA ) 30 MG capsule, Take 30 mg by mouth daily., Disp: , Rfl:    DULoxetine  (CYMBALTA ) 60 MG capsule, Take 60 mg by mouth daily. Taking 90 mg daily, Disp: , Rfl:    fluticasone  (FLONASE ) 50 MCG/ACT nasal spray, Nasal; Duration: 90 Days, Disp: , Rfl:    Inclisiran Sodium  (LEQVIO  ), Inject into the skin., Disp: , Rfl:    LORazepam  (ATIVAN ) 0.5 MG tablet, Take 0.5 mg by mouth as needed for anxiety., Disp: , Rfl:    Semaglutide , 2 MG/DOSE, (OZEMPIC , 2 MG/DOSE,) 8 MG/3ML SOPN, Inject 2 mg into the skin once a week. Gets from pt assist, Disp: 2 mL, Rfl: 0   tiZANidine  (ZANAFLEX ) 4 MG tablet, TAKE 1 TABLET  BY MOUTH AT BEDTIME., Disp: 90 tablet, Rfl: 1   traZODone  (DESYREL ) 50 MG tablet, TAKE 1/2 TO 1 TABLET BY MOUTH AT BEDTIME AS NEEDED FOR SLEEP, Disp: 90 tablet, Rfl: 1   vitamin B-12 (CYANOCOBALAMIN ) 1000 MCG tablet, Take 1,000 mcg by mouth daily., Disp: , Rfl:    Vitamin D , Ergocalciferol , (DRISDOL ) 1.25 MG (50000 UNIT) CAPS capsule, TAKE 1 CAPSULE BY MOUTH ONE TIME PER WEEK, Disp: 12 capsule, Rfl: 0   atorvastatin  (LIPITOR) 40 MG tablet, Take 1 tablet (40 mg total) by mouth daily., Disp: 90 tablet, Rfl: 3   glucose blood (ONETOUCH ULTRA) test strip, 1 each by Other route in the morning and at bedtime. Use as instructed, Disp: 200 strip, Rfl: 3   Lancets (ONETOUCH ULTRASOFT) lancets, Check blood sugar once daily. Dx code: 250.00, Disp: 100 each, Rfl: 12   metoprolol  succinate (TOPROL -XL) 25 MG 24 hr tablet, Take 0.5 tablets (12.5 mg total) by mouth daily., Disp: 45 tablet, Rfl: 1   omeprazole  (PRILOSEC) 20 MG capsule, TAKE 1 CAPSULE BY MOUTH EVERY DAY, Disp: 90 capsule, Rfl: 3  Allergies  Allergen Reactions   Vioxx [Rofecoxib] Other (See Comments)    Excess BP which caused TIA    Prednisone  Other (See Comments)    Mental status changes with high-dose oral agent. Tolerates epidural steroid injections.  No  associated rash or fever   Colesevelam Hcl Other (See Comments)   Ezetimibe  Other (See Comments)    myalgias   Macrodantin  [Nitrofurantoin  Macrocrystal] Hives   Nitrofurantoin  Other (See Comments)   Sulfa  Antibiotics Other (See Comments)    Hives   Colesevelam Other (See Comments)    Leg Pain   ROS neg/noncontributory except as noted HPI/below      Objective:     BP 123/81   Pulse 75   Temp (!) 97.5 F (36.4 C) (Temporal)   Resp 16   Ht 5' 5 (1.651 m)   Wt 130 lb 6 oz (59.1 kg)   LMP  (LMP Unknown)   SpO2 99%   BMI 21.70 kg/m  Wt Readings from Last 3 Encounters:  06/30/24 130 lb 6 oz (59.1 kg)  06/27/24 131 lb (59.4 kg)  05/25/24 131 lb 9.6 oz (59.7 kg)    Physical Exam   Gen: WDWN NAD HEENT: NCAT, conjunctiva not injected, sclera nonicteric NECK:  supple, no thyromegaly, no nodes, no carotid bruits CARDIAC: RRR, S1S2+, no murmur. DP 1+B LUNGS: CTAB. No wheezes ABDOMEN:  BS+, soft, NTND, No HSM, no masses EXT:  no edema MSK: no gross abnormalities.  NEURO: A&O x3.  CN II-XII intact.  PSYCH: normal mood. Good eye contact     Assessment & Plan:  Type 2 diabetes mellitus with hyperglycemia, without long-term current use of insulin  (HCC) -     Atorvastatin  Calcium ; Take 1 tablet (40 mg total) by mouth daily.  Dispense: 90 tablet; Refill: 3 -     OneTouch UltraSoft Lancets; Check blood sugar once daily. Dx code: 250.00  Dispense: 100 each; Refill: 12 -     OneTouch Ultra; 1 each by Other route in the morning and at bedtime. Use as instructed  Dispense: 200 strip; Refill: 3 -     Microalbumin / creatinine urine ratio -     Comprehensive metabolic panel with GFR -     Hemoglobin A1c  Essential hypertension -     Metoprolol  Succinate ER; Take 0.5 tablets (12.5 mg total) by mouth daily.  Dispense: 45 tablet; Refill: 1 -  CBC with Differential/Platelet  Hyperlipidemia associated with type 2 diabetes mellitus (HCC) -     Comprehensive metabolic panel with  GFR -     Lipid panel  Gastroesophageal reflux disease without esophagitis -     Omeprazole ; TAKE 1 CAPSULE BY MOUTH EVERY DAY  Dispense: 90 capsule; Refill: 3  Urinary frequency -     Urine Culture -     Urinalysis, Routine w reflex microscopic; Future  Migraine without aura and without status migrainosus, not intractable -     Ambulatory referral to Neurology  Pituitary mass [facility]) -     Prolactin  Assessment and Plan Assessment & Plan Type 2 Diabetes Mellitus   Blood glucose levels are not well controlled with the current Ozempic  regimen, with morning readings between 130-150 mg/dL and occasional lows of 103 mg/dL. She reports weight loss and concerns about Ozempic 's effectiveness. Potential causes of elevated morning glucose, such as dawn phenomenon and Somogyi effect, were discussed. She is considering adjusting the Ozempic  dosage or adding metformin .. Increase glucometer strips for twice daily monitoring, including before bed and in the morning. Check blood glucose at 2-3 AM once to assess for dawn phenomenon or Somogyi effect. Discuss potential adjustment of Ozempic  dosage by counting clicks to reduce the dose slightly. Consider adding metformin  at night if dawn phenomenon is confirmed.   Urinary Incontinence   She reports increased urinary frequency and new onset of significant leakage over the past month. Nocturia is present, and she uses pads for protection. Differential includes urinary tract infection and overactive bladder. Perform urinalysis to rule out urinary tract infection. Consider Myrbetriq for overactive bladder if urinalysis is negative and symptoms persist- has been on in past.  Pituitary Adenoma   She has pituitary adenoma with elevated prolactin levels. Recent 24-hour urine test results are pending. She experiences headaches and dizziness, which are not attributed to the adenoma by the endocrinologist. Follow up with endocrinologist Dr. Bella regarding 24-hour urine  test results and potential need for medication for elevated prolactin. Attend neurosurgery appointment on July 22 for further evaluation.  Hyperlipidemia   She is on atorvastatin  40 mg for cholesterol management and receives Leqvio  injections every six months for LDL control. Last injection was in June. Renew atorvastatin  prescription.  Hypertension   Blood pressure is managed with metoprolol  25 mg daily. Renew metoprolol  prescription.  General Health Maintenance   She is on vitamin D  and B12 supplements. Continue vitamin D  and B12 supplementation.    Return in about 3 months (around 09/30/2024) for chronic follow-up.  Jenkins CHRISTELLA Carrel, MD

## 2024-06-30 NOTE — Patient Instructions (Addendum)
 Do a check before bed and in am  Do a 2-3am check and when get up again  Check sugar once/day but different times.

## 2024-06-30 NOTE — Progress Notes (Signed)
 Cholesterol great Potassium barely normal-does she want to take potassium 10meq po daily rx or get otc supplement? Sugars worse.  Does she want to start 500mg  metformin  at supper? Urine may be a little dehydrated or she's not eaten much lately.  Make sure drinking plenty Urine has some protein.  Already on ozempic .  We took off ace-I meds d/t low bp.  Also, some protein casts.  I recommend u/s renal if willing

## 2024-07-01 LAB — URINE CULTURE
MICRO NUMBER:: 16717680
Result:: NO GROWTH
SPECIMEN QUALITY:: ADEQUATE

## 2024-07-01 LAB — PROLACTIN: Prolactin: 31.4 ng/mL — ABNORMAL HIGH

## 2024-07-03 ENCOUNTER — Other Ambulatory Visit: Payer: Self-pay | Admitting: *Deleted

## 2024-07-03 ENCOUNTER — Other Ambulatory Visit: Payer: Medicare Other

## 2024-07-03 DIAGNOSIS — D352 Benign neoplasm of pituitary gland: Secondary | ICD-10-CM | POA: Insufficient documentation

## 2024-07-03 MED ORDER — POTASSIUM CHLORIDE CRYS ER 10 MEQ PO TBCR: 10.0000 meq | EXTENDED_RELEASE_TABLET | Freq: Every day | ORAL | 0 refills | Status: DC

## 2024-07-04 ENCOUNTER — Ambulatory Visit (INDEPENDENT_AMBULATORY_CARE_PROVIDER_SITE_OTHER): Admitting: Neurosurgery

## 2024-07-04 ENCOUNTER — Other Ambulatory Visit: Payer: Self-pay | Admitting: Family Medicine

## 2024-07-04 ENCOUNTER — Encounter: Payer: Self-pay | Admitting: Neurosurgery

## 2024-07-04 VITALS — BP 131/64 | HR 79 | Ht 65.0 in | Wt 133.0 lb

## 2024-07-04 DIAGNOSIS — D352 Benign neoplasm of pituitary gland: Secondary | ICD-10-CM

## 2024-07-04 DIAGNOSIS — D497 Neoplasm of unspecified behavior of endocrine glands and other parts of nervous system: Secondary | ICD-10-CM | POA: Diagnosis not present

## 2024-07-04 NOTE — Progress Notes (Unsigned)
 Assessment : 73 year old lady with new onset of headaches in the beginning of last year which have been progressive over time.  Patient eventually had a MRI done of her brain without contrast and subsequently was referred to endocrinologist, Dr. Tommas, who did an MRI with contrast as well.  Patient has headaches in her forehead and in her temples and they are severe.  She says that she also gets very lightheaded when she moves her head too quickly and stands up rapidly.  She is on Toprol -XL.  She had a full hormonal workup done and a first prolactin was slightly above 40, that [birth year]/14/1932 and a 1 thereafter 31.  All her other hormonal workup according to her was normal.  I have a letter from her endocrinologist in the media section but these results are not in there.  The last 2 prolactin levels are fortunately.  Plan : I reviewed the MRI with her which shows this tiny few millimeter right pituitary gland hypodensity, suspect for a small microadenoma.  I shared with her that more likely than not this is unrelated to her headaches and her dizziness.  I think with normal blood work, I do not recommend any surgical intervention for this and as per the recommendation made by her endocrinologist, would get an MRI in 1 year.  I personally do not think that she needs any medical management for the prolactin as oftentimes any abnormality in the pituitary gland can lead to elevation of the prolactin levels and it is called a so-called stalk effect.  Furthermore, I think that some of the dizziness with rapid positional changes can be secondary to her beta-blocker and I recommended that she talks to her primary care doctor about that.  She is going to be getting a 1 year follow-up MRI through her endocrinologist and I told her that if there is any further enlargement that she is welcome to come and see me.  If she feels better that I get this MRI I am happy to do so and she will let me know.  From my standpoint  she does not need to follow-up with me unless there is any further concern on future MRIs.   Social History   Socioeconomic History   Marital status: Married    Spouse name: Not on file   Number of children: 2   Years of education: Not on file   Highest education level: Not on file  Occupational History   Occupation: Secretary/administrator: [employer]  Tobacco Use   Smoking status: Never   Smokeless tobacco: Never  Vaping Use   Vaping status: Never Used  Substance and Sexual Activity   Alcohol use: No   Drug use: No   Sexual activity: Not on file  Other Topics Concern   Not on file  Social History Narrative   Regular exercise- no    Son dec 2011.  1 grand   Social Drivers of Corporate investment banker Strain: Low Risk  (08/28/2023)   Overall Financial Resource Strain (CARDIA)    Difficulty of Paying Living Expenses: Not hard at all  Food Insecurity: No Food Insecurity (08/28/2023)   Hunger Vital Sign    Worried About Running Out of Food in the Last Year: Never true    Ran Out of Food in the Last Year: Never true  Transportation Needs: No Transportation Needs (08/28/2023)   PRAPARE - Administrator, Civil Service (Medical): No  Lack of Transportation (Non-Medical): No  Physical Activity: Inactive (08/28/2023)   Exercise Vital Sign    Days of Exercise per Week: 0 days    Minutes of Exercise per Session: 0 min  Stress: No Stress Concern Present (08/28/2023)   [employer] of Occupational Health - Occupational Stress Questionnaire    Feeling of Stress : Only a little  Social Connections: Moderately Integrated (08/28/2023)   Social Connection and Isolation Panel    Frequency of Communication with Friends and Family: Twice a week    Frequency of Social Gatherings with Friends and Family: Once a week    Attends Religious Services: More than 4 times per year    Active Member of [employer] or Organizations: No    Attends Tax inspector Meetings: Never    Marital Status: Married  Catering manager Violence: Not At Risk (09/02/2023)   Humiliation, Afraid, Rape, and Kick questionnaire    Fear of Current or Ex-Partner: No    Emotionally Abused: No    Physically Abused: No    Sexually Abused: No    Family History  Problem Relation Age of Onset   Colon cancer Maternal Aunt    Diabetes Paternal Uncle    Heart attack Father 52   COPD Mother    Lung cancer Brother        smoker   Mental illness Other        niece, committed suicide.   Heart attack Other        brother X 2; @ 50 & 52   Stroke Neg Hx     Allergies  Allergen Reactions   Vioxx [Rofecoxib] Other (See Comments)    Excess BP which caused TIA    Prednisone  Other (See Comments)    Mental status changes with high-dose oral agent. Tolerates epidural steroid injections.  No associated rash or fever   Colesevelam Hcl Other (See Comments)   Ezetimibe  Other (See Comments)    myalgias   Macrodantin  [Nitrofurantoin  Macrocrystal] Hives   Nitrofurantoin  Other (See Comments)   Sulfa  Antibiotics Other (See Comments)    Hives   Colesevelam Other (See Comments)    Leg Pain    Past Medical History:  Diagnosis Date   Acute bursitis of right shoulder 08/29/2018   Right shoulder injected in August 29, 2018   Allergic rhinitis    Closed displaced fracture of fifth metatarsal bone of left foot 03/16/2017   DEPRESSIVE DISORDER NOT ELSEWHERE CLASSIFIED 03/27/2010   Qualifier: Diagnosis of  By: [name], [name]   [name], NP , [facility] [place]     Diabetes (HCC) 04/02/2016   Diabetes mellitus    Essential hypertension 03/14/2008   Qualifier: Diagnosis of  By: [name], [name]     Fibromyalgia 08/12/2011   Diagnosed by [name] , Rheumatologist 2010    GERD (gastroesophageal reflux disease) 12/25/2016   Gluteal tendonitis of right buttock 10/19/2017   Injected in October 19, 2017 Injected October 07, 2018   Greater  trochanteric bursitis of left hip 06/17/2016   Greater trochanteric bursitis of right hip 11/27/2015   Injected 11/27/2015    History of recurrent UTIs    [name]   HTN (hypertension)    Hx of osteopenia 10/11/2012   [facility] DEXA; ordered by [name] Lowest T score: -2.1 @ lumbar spine @ [facility] 11/01/12; decrease of 8.5% vs 05/2009. There is 9.2% risk over 10 yrs History fracture left elbow @ 4 Fractured left  wrist , right elbow and nose post fall July/18/2013. Dr. [name] No FH Osteoporosis No PMH of bisphosphonate therapy (generic Fosamax  Rxed but not filled  due to polypharmacy )    Hyperlipidemia    Hyperlipidemia associated with type 2 diabetes mellitus (HCC) 01/18/2007   Qualifier: Diagnosis of  By: [name]  With DM LDL goal = < 100, ideally < 70. Mi in father @ 27; 2 brothers @ 36 & 88     Lumbar radiculopathy 04/11/2008   Qualifier: Diagnosis of  By: [name], [name] Dock well to epidural 01/12/2017  repeat epidural given May 2018   Migraine headache    quiescent   Nonallopathic lesion of cervical region 07/19/2014   Nonallopathic lesion of lumbosacral region 01/15/2016   Nonallopathic lesion of sacral region 06/27/2018   Nonallopathic lesion-rib cage 09/26/2014   Palpitations 04/15/2011   Rotator cuff impingement syndrome of left shoulder 05/11/2014   Seasonal allergic rhinitis 03/21/2012   Sinusitis, chronic 04/20/2016   Sleep disorder    Dr Dohmier   Subluxation of peroneal tendon of right foot 11/10/2017   TIA (transient ischemic attack)    TRANSIENT ISCHEMIC ATTACKS, HX OF 02/02/2008   Qualifier: Diagnosis of  By: Claudene GUSS Heron SERITA ADVRS EFF UNS RX MEDICINAL&BIOLOGICAL SBSTNC 02/02/2008   Qualifier: Diagnosis of  By: [name], [name] [name] AND MYOSITIS 02/02/2008   Qualifier: Diagnosis of  By: [name], [name]     UTI (urinary tract infection) 10/05/2014   Viral upper respiratory tract infection with cough 12/31/2014   Vitamin D  deficiency  05/25/2008   Qualifier: Diagnosis of  By: [name], [name]      Past Surgical History:  Procedure Laterality Date   COLONOSCOPY     Dr Avram; hemorrhoids   CORONARY ANGIOPLASTY  12/15/1995   for chest pain- negative    FOOT MASS EXCISION Left 04/08/2023   KNEE ARTHROSCOPY Left 2023   POLYPECTOMY  12/14/2000   benign, hyperplastic polyp; rectal bleeding 2008   TONSILLECTOMY     TOTAL ABDOMINAL HYSTERECTOMY  12/14/1981   BSO for endometriosis   WISDOM TOOTH EXTRACTION       Physical Exam     Results for orders placed or performed during the hospital encounter of 04/13/24  CT Head Wo Contrast   Narrative   EXAM: CT HEAD WITHOUT 04/13/2024 09:21:21 PM  TECHNIQUE: CT of the head was performed without the administration of intravenous contrast. Automated exposure control, iterative reconstruction, and/or weight based adjustment of the mA/kV was utilized to reduce the radiation dose to as low as reasonably achievable.  COMPARISON: MR brain dated 03/07/2024.  CLINICAL HISTORY: Headache, sudden, severe. 73 y/o female. Patient had procedure done for compressed disc in neck today at 1130. Reports headaches, dizziness, increased blood sugar, increased urinary urgency.  FINDINGS:  BRAIN AND VENTRICLES: There is no acute intracranial hemorrhage, mass effect or midline shift. No abnormal extra-axial fluid collection. The gray-white differentiation is maintained without evidence of an acute infarct. There is no evidence of hydrocephalus. Global cortical atrophy. Subcortical and periventricular small vessel ischemic changes. Old right frontal infarct.  ORBITS: The visualized portion of the orbits demonstrate no acute abnormality.  SINUSES: The visualized paranasal sinuses and mastoid air cells demonstrate no acute abnormality.  SOFT TISSUES AND SKULL: No acute abnormality of the visualized skull or soft tissues.  IMPRESSION: 1. No acute intracranial  abnormality. 2. Atrophy with small vessel ischemic changes. 3. Old  right frontal infarct.  Electronically signed by: Pinkie Pebbles MD 04/13/2024 09:27 PM EDT RP Workstation: [id number]   Results for orders placed or performed during the hospital encounter of 03/07/24  MR BRAIN W WO CONTRAST   Narrative   CLINICAL DATA:  Dizziness with neck pain  EXAM: MRI HEAD WITHOUT AND WITH CONTRAST  TECHNIQUE: Multiplanar, multiecho pulse sequences of the brain and surrounding structures were obtained without and with intravenous contrast.  CONTRAST:  6 cc of vueway  intravenous  COMPARISON:  10/12/2023  FINDINGS: Brain: No acute infarction, hemorrhage, hydrocephalus, extra-axial collection or mass lesion. Chronic anterior right frontal infarct affecting cortex. Chronic small vessel ischemia in the cerebral white matter.  Brain atrophy.  Hypoenhancing nodule in the right aspect of the sella measuring 5 mm on series 15  Vascular: Normal flow voids.  Skull and upper cervical spine: Normal marrow signal.  Sinuses/Orbits: Negative.  IMPRESSION: No recent insult or specific cause for symptoms.  Chronic small vessel disease and small chronic right frontal cortex infarct.  5 mm right pituitary mass, likely adenoma. No mass effect on adjacent structures, correlate for pituitary hypersecretion.   Electronically Signed   By: Dorn Roulette M.D.   On: 03/28/2024 06:26   Results for orders placed or performed during the hospital encounter of 10/12/23  MR Brain Wo Contrast   Narrative   CLINICAL DATA:  Headache, increasing frequency or severity. Forehead pressure. Nausea. History of a fall and concussion in 2023.  EXAM: MRI HEAD WITHOUT CONTRAST  TECHNIQUE: Multiplanar, multiecho pulse sequences of the brain and surrounding structures were obtained without intravenous contrast.  COMPARISON:  Head CT 10/27/2022 and MRI 07/23/2021  FINDINGS: Brain: There is no evidence of  an acute infarct, intracranial hemorrhage, mass, midline shift, or extra-axial fluid collection. Small T2 hyperintensities in the cerebral white matter are unchanged from the prior MRI and are nonspecific but compatible with mild chronic small vessel ischemic disease. A small lateral right frontal cortical infarct is unchanged. Mild generalized cerebral atrophy is within normal limits for age.  Vascular: Major intracranial vascular flow voids are preserved.  Skull and upper cervical spine: Unremarkable bone marrow signal.  Sinuses/Orbits: Bilateral cataract extraction. Paranasal sinuses and mastoid air cells are clear.  Other: None.  IMPRESSION: 1. No acute intracranial abnormality. 2. Mild chronic small vessel ischemic disease. 3. Small chronic right frontal infarct.   Electronically Signed   By: Dasie Hamburg M.D.   On: 10/25/2023 16:57    *Note: Due to a large number of results and/or encounters for the requested time period, some results have not been displayed. A complete set of results can be found in Results Review.

## 2024-07-10 ENCOUNTER — Encounter: Payer: Self-pay | Admitting: Neurology

## 2024-07-13 ENCOUNTER — Ambulatory Visit
Admission: RE | Admit: 2024-07-13 | Discharge: 2024-07-13 | Disposition: A | Discharge status: Home / Self Care | Source: Ambulatory Visit | Attending: General Surgery | Admitting: General Surgery

## 2024-07-13 DIAGNOSIS — R9230 Dense breasts, unspecified: Secondary | ICD-10-CM

## 2024-07-13 DIAGNOSIS — N6321 Unspecified lump in the left breast, upper outer quadrant: Secondary | ICD-10-CM

## 2024-07-13 DIAGNOSIS — N644 Mastodynia: Secondary | ICD-10-CM

## 2024-07-13 MED ORDER — GADOPICLENOL 0.5 MMOL/ML IV SOLN
6.0000 mL | Freq: Once | INTRAVENOUS | Status: AC | PRN
  Administered 2024-07-13: 6 mL via INTRAVENOUS

## 2024-07-25 ENCOUNTER — Other Ambulatory Visit (HOSPITAL_COMMUNITY): Payer: Self-pay | Admitting: General Surgery

## 2024-07-25 DIAGNOSIS — R9389 Abnormal findings on diagnostic imaging of other specified body structures: Secondary | ICD-10-CM

## 2024-07-25 DIAGNOSIS — R928 Other abnormal and inconclusive findings on diagnostic imaging of breast: Secondary | ICD-10-CM

## 2024-07-26 ENCOUNTER — Other Ambulatory Visit: Payer: Self-pay | Admitting: Family Medicine

## 2024-07-27 ENCOUNTER — Ambulatory Visit (HOSPITAL_COMMUNITY)
Admission: RE | Admit: 2024-07-27 | Discharge: 2024-07-27 | Disposition: A | Discharge status: Home / Self Care | Source: Ambulatory Visit | Attending: General Surgery | Admitting: General Surgery

## 2024-07-27 DIAGNOSIS — R928 Other abnormal and inconclusive findings on diagnostic imaging of breast: Secondary | ICD-10-CM | POA: Diagnosis not present

## 2024-07-27 DIAGNOSIS — R9389 Abnormal findings on diagnostic imaging of other specified body structures: Secondary | ICD-10-CM | POA: Diagnosis not present

## 2024-07-27 DIAGNOSIS — M899 Disorder of bone, unspecified: Secondary | ICD-10-CM | POA: Diagnosis not present

## 2024-07-27 MED ORDER — GADOBUTROL 1 MMOL/ML IV SOLN
6.0000 mL | Freq: Once | INTRAVENOUS | Status: AC | PRN
  Administered 2024-07-27: 6 mL via INTRAVENOUS

## 2024-07-28 DIAGNOSIS — F410 Panic disorder [episodic paroxysmal anxiety] without agoraphobia: Secondary | ICD-10-CM | POA: Diagnosis not present

## 2024-08-02 ENCOUNTER — Ambulatory Visit: Payer: Self-pay | Admitting: General Surgery

## 2024-08-15 ENCOUNTER — Ambulatory Visit (HOSPITAL_BASED_OUTPATIENT_CLINIC_OR_DEPARTMENT_OTHER)
Admission: RE | Admit: 2024-08-15 | Discharge: 2024-08-15 | Disposition: A | Discharge status: Home / Self Care | Source: Ambulatory Visit | Attending: Family Medicine | Admitting: Family Medicine

## 2024-08-15 DIAGNOSIS — E2839 Other primary ovarian failure: Secondary | ICD-10-CM | POA: Insufficient documentation

## 2024-08-15 DIAGNOSIS — Z780 Asymptomatic menopausal state: Secondary | ICD-10-CM | POA: Diagnosis not present

## 2024-08-15 DIAGNOSIS — M810 Age-related osteoporosis without current pathological fracture: Secondary | ICD-10-CM | POA: Diagnosis not present

## 2024-08-15 LAB — HM DEXA SCAN

## 2024-08-16 ENCOUNTER — Ambulatory Visit: Payer: Self-pay | Admitting: Family Medicine

## 2024-08-16 ENCOUNTER — Encounter: Payer: Self-pay | Admitting: Family Medicine

## 2024-08-16 DIAGNOSIS — E119 Type 2 diabetes mellitus without complications: Secondary | ICD-10-CM | POA: Diagnosis not present

## 2024-08-17 ENCOUNTER — Other Ambulatory Visit: Payer: Self-pay | Admitting: *Deleted

## 2024-08-17 DIAGNOSIS — M810 Age-related osteoporosis without current pathological fracture: Secondary | ICD-10-CM

## 2024-08-24 NOTE — Progress Notes (Signed)
 [name] [address] [place] [address] Phone: [phone] Subjective:   Christine [name], am serving as a scribe for Dr. Arthea Claudene.  I'm seeing this patient by the request  of:  Christine Jenkins Jansky, MD  CC: Back and neck pain follow-up  YEP:Dlagzrupcz  Christine [name] is a 73 y.o. female coming in with complaint of back and neck pain. OMT 06/27/2024. Patient states doing okay. Having problems with neck. No other concerns. Seeing Dr. Joane because of bone density scan results.  Medications patient has been prescribed: None  Taking:         Reviewed prior external information including notes and imaging from previsou exam, outside providers and external EMR if available.   As well as notes that were available from care everywhere and other healthcare systems.  Past medical history, social, surgical and family history all reviewed in electronic medical record.  No pertanent information unless stated regarding to the chief complaint.   Past Medical History:  Diagnosis Date   Acute bursitis of right shoulder 08/29/2018   Right shoulder injected in August 29, 2018   Allergic rhinitis    Closed displaced fracture of fifth metatarsal bone of left foot 03/16/2017   DEPRESSIVE DISORDER NOT ELSEWHERE CLASSIFIED 03/27/2010   Qualifier: Diagnosis of  By: [name], [name]   [name], NP , [facility] [place] [place]     Diabetes (HCC) 04/02/2016   Diabetes mellitus    Essential hypertension 03/14/2008   Qualifier: Diagnosis of  By: [name], [name]     Fibromyalgia 08/12/2011   Diagnosed by [name] , Rheumatologist 2010    GERD (gastroesophageal reflux disease) 12/25/2016   Gluteal tendonitis of right buttock 10/19/2017   Injected in October 19, 2017 Injected October 07, 2018   Greater trochanteric bursitis of left hip 06/17/2016   Greater trochanteric bursitis of right hip 11/27/2015   Injected 11/27/2015    History of  recurrent UTIs    [name]   HTN (hypertension)    Hx of osteopenia 10/11/2012   [facility] DEXA; ordered by [name] Lowest T score: -2.1 @ lumbar spine @ [facility] 11/01/12; decrease of 8.5% vs 05/2009. There is 9.2% risk over 10 yrs History fracture left elbow @ 4 Fractured left wrist , right elbow and nose post fall July/18/2013. Dr. [name] No FH Osteoporosis No PMH of bisphosphonate therapy (generic Fosamax  Rxed but not filled  due to polypharmacy )    Hyperlipidemia    Hyperlipidemia associated with type 2 diabetes mellitus (HCC) 01/18/2007   Qualifier: Diagnosis of  By: [name]  With DM LDL goal = < 100, ideally < 70. Mi in father @ 80; 2 brothers @ 39 & 47     Lumbar radiculopathy 04/11/2008   Qualifier: Diagnosis of  By: [name], [name] Dock well to epidural 01/12/2017  repeat epidural given May 2018   Migraine headache    quiescent   Nonallopathic lesion of cervical region 07/19/2014   Nonallopathic lesion of lumbosacral region 01/15/2016   Nonallopathic lesion of sacral region 06/27/2018   Nonallopathic lesion-rib cage 09/26/2014   Palpitations 04/15/2011   Rotator cuff impingement syndrome of left shoulder 05/11/2014   Seasonal allergic rhinitis 03/21/2012   Sinusitis, chronic 04/20/2016   Sleep disorder    Dr Dohmier   Subluxation of peroneal tendon of right foot 11/10/2017   TIA (transient ischemic attack)    TRANSIENT ISCHEMIC ATTACKS, HX OF  02/02/2008   Qualifier: Diagnosis of  By: Claudene GUSS Heron SERITA ADVRS EFF UNS RX MEDICINAL&BIOLOGICAL SBSTNC 02/02/2008   Qualifier: Diagnosis of  By: [name], [name] [name] AND MYOSITIS 02/02/2008   Qualifier: Diagnosis of  By: [name], [name]     UTI (urinary tract infection) 10/05/2014   Viral upper respiratory tract infection with cough 12/31/2014   Vitamin D  deficiency 05/25/2008   Qualifier: Diagnosis of  By: [name], [name]      Allergies  Allergen Reactions   Vioxx [Rofecoxib] Other (See  Comments)    Excess BP which caused TIA    Prednisone  Other (See Comments)    Mental status changes with high-dose oral agent. Tolerates epidural steroid injections.  No associated rash or fever   Colesevelam Hcl Other (See Comments)   Ezetimibe  Other (See Comments)    myalgias   Macrodantin  [Nitrofurantoin  Macrocrystal] Hives   Nitrofurantoin  Other (See Comments)   Sulfa  Antibiotics Other (See Comments)    Hives   Colesevelam Other (See Comments)    Leg Pain     Review of Systems:  No  visual changes, nausea, vomiting, diarrhea, constipation, dizziness, abdominal pain, skin rash, fevers, chills, night sweats, weight loss, swollen lymph nodes, body aches, joint swelling, chest pain, shortness of breath, mood changes. POSITIVE muscle aches, headache  Objective  Height 5' 5 (1.651 m), weight 133 lb (60.3 kg), SpO2 98%.   General: No apparent distress alert and oriented x3 mood and affect normal, dressed appropriately.  HEENT: Pupils equal, extraocular movements intact  Respiratory: Patient's speak in full sentences and does not appear short of breath  Cardiovascular: No lower extremity edema, non tender, no erythema  Gait relatively normal Neck exam still has some mild loss lordosis noted.  Patient still has some loss of range of motion of the neck especially with left-sided sidebending and rotation.  Osteopathic findings  C2 flexed rotated and side bent right C6 flexed rotated and side bent left T3 extended rotated and side bent right inhaled rib T9 extended rotated and side bent left L2 flexed rotated and side bent right L3 flexed rotated and side bent left Sacrum right on right       Assessment and Plan:  Cervical disc disorder with radiculopathy of cervical region Known arthritic changes noted but has responded well to muscle energy.  Discussed icing regimen and home exercises, discussed which activities to do.  Increase activity slowly.  Discussed icing regimen.   Follow-up again in 6 to 8 weeks  Fibromyalgia Patient has what appears to be more of a flare at the moment.  No change in management.  Could give injections if necessary.  Pituitary microadenoma [facility]) Being followed by other providers.  Vitamin D  deficiency Continue supplementation    Nonallopathic problems  Decision today to treat with OMT was based on Physical Exam  After verbal consent patient was treated with HVLA, ME, FPR techniques in cervical, rib, thoracic, lumbar, and sacral  areas  Patient tolerated the procedure well with improvement in symptoms  Patient given exercises, stretches and lifestyle modifications  See medications in patient instructions if given  Patient will follow up in 4-8 weeks     The above documentation has been reviewed and is accurate and complete Lazar Tierce M Adabelle Griffiths, DO         Note: This dictation was prepared with Dragon dictation along with smaller phrase technology. Any transcriptional errors that result from this process  are unintentional.

## 2024-08-29 ENCOUNTER — Ambulatory Visit (INDEPENDENT_AMBULATORY_CARE_PROVIDER_SITE_OTHER): Admitting: Family Medicine

## 2024-08-29 ENCOUNTER — Encounter: Payer: Self-pay | Admitting: Family Medicine

## 2024-08-29 VITALS — Ht 65.0 in | Wt 133.0 lb

## 2024-08-29 DIAGNOSIS — E559 Vitamin D deficiency, unspecified: Secondary | ICD-10-CM | POA: Diagnosis not present

## 2024-08-29 DIAGNOSIS — M9908 Segmental and somatic dysfunction of rib cage: Secondary | ICD-10-CM

## 2024-08-29 DIAGNOSIS — D352 Benign neoplasm of pituitary gland: Secondary | ICD-10-CM | POA: Diagnosis not present

## 2024-08-29 DIAGNOSIS — M9904 Segmental and somatic dysfunction of sacral region: Secondary | ICD-10-CM | POA: Diagnosis not present

## 2024-08-29 DIAGNOSIS — M5010 Cervical disc disorder with radiculopathy, unspecified cervical region: Secondary | ICD-10-CM

## 2024-08-29 DIAGNOSIS — M9903 Segmental and somatic dysfunction of lumbar region: Secondary | ICD-10-CM | POA: Diagnosis not present

## 2024-08-29 DIAGNOSIS — M9901 Segmental and somatic dysfunction of cervical region: Secondary | ICD-10-CM

## 2024-08-29 DIAGNOSIS — M797 Fibromyalgia: Secondary | ICD-10-CM

## 2024-08-29 DIAGNOSIS — M9902 Segmental and somatic dysfunction of thoracic region: Secondary | ICD-10-CM | POA: Diagnosis not present

## 2024-08-29 NOTE — Assessment & Plan Note (Signed)
 Patient has what appears to be more of a flare at the moment.  No change in management.  Could give injections if necessary.

## 2024-08-29 NOTE — Assessment & Plan Note (Signed)
 Known arthritic changes noted but has responded well to muscle energy.  Discussed icing regimen and home exercises, discussed which activities to do.  Increase activity slowly.  Discussed icing regimen.  Follow-up again in 6 to 8 weeks

## 2024-08-29 NOTE — Assessment & Plan Note (Signed)
 Continue supplementation  ?

## 2024-08-29 NOTE — Patient Instructions (Addendum)
 Osteostrong See you again in 6-8 weeks

## 2024-08-29 NOTE — Assessment & Plan Note (Signed)
 Being followed by other providers.

## 2024-08-30 ENCOUNTER — Encounter: Payer: Self-pay | Admitting: Family Medicine

## 2024-08-30 ENCOUNTER — Telehealth: Payer: Self-pay

## 2024-08-30 ENCOUNTER — Telehealth (HOSPITAL_COMMUNITY): Payer: Self-pay

## 2024-08-30 ENCOUNTER — Ambulatory Visit (INDEPENDENT_AMBULATORY_CARE_PROVIDER_SITE_OTHER): Admitting: Family Medicine

## 2024-08-30 VITALS — BP 120/82 | HR 68 | Ht 65.0 in | Wt 143.0 lb

## 2024-08-30 DIAGNOSIS — M810 Age-related osteoporosis without current pathological fracture: Secondary | ICD-10-CM | POA: Insufficient documentation

## 2024-08-30 NOTE — Patient Instructions (Addendum)
 Thank you for coming in today.   Plan to start Reclast at the Infusion [facility]

## 2024-08-30 NOTE — Telephone Encounter (Signed)
 Auth Submission: NO AUTH NEEDED Site of care: Site of care: [facility] Payer: Medicare A/B Medication & CPT/J Code(s) submitted: Reclast (Zolendronic acid) [id number] Diagnosis Code: Z87.39 Route of submission (phone, fax, portal):  Phone # Fax # Auth type: Buy/Bill PB Units/visits requested: 5mg  x 1 dose Reference number:  Approval from: 08/30/24 to 01/13/25

## 2024-08-30 NOTE — Progress Notes (Signed)
   I, Leotis Batter, CMA acting as a scribe for Artist Lloyd, MD.  Christine [name] is a 73 y.o. female who presents to [facility] today for osteoporosis management. Pt was previously seen by Dr. Claudene yesterday for OMT.  DEXA scan (date, T-score): 08/15/24: Spine= -1.7, L-FN= -2.8, R-FN= -2.7 Prior treatment: none History of Hip, Spine, or Wrist Fx: left wrist, right elbow, nose - 2013, fall while stepping over dog gate Heart disease or stroke: TIA in 2000's Cancer: no Kidney Disease: no Gastric/Peptic Ulcer: no Gastric bypass surgery: no Severe GERD: mild Hx of seizures: no Age at Menopause: 35 - hysterectomy Calcium  intake: not supplementing - doesn't consume much dairy Vitamin D  intake: supplementing 50,000 international units weekly Hormone replacement therapy: premarin after hysterectomy - no longer taking Smoking history: never Alcohol: no Exercise: not regularly d/t HA's Major dental work in past year: no Parents with hip/spine fracture: no Height loss: 1      Pertinent review of systems: No fevers or chills  Relevant historical information: Hypertension and diabetes.   Exam:  BP 120/82   Pulse 68   Ht 5' 5 (1.651 m)   Wt 143 lb (64.9 kg)   LMP  (LMP Unknown)   SpO2 99%   BMI 23.80 kg/m  General: Well Developed, well nourished, and in no acute distress.   MSK: Normal gait    Lab and Radiology Results No results found. However, due to the size of the patient record, not all encounters were searched. Please check Results Review for a complete set of results. No results found.     Assessment and Plan: 73 y.o. female with osteoporosis.  She does not have headaches that is challenging for her to get enough weightbearing exercise or resistance training.  She does have adequate vitamin D  and calcium  when recently checked.  Increasing weightbearing exercise would definitely be helpful and I do recommend doing that.  After  discussion of medications will start using an infusion Reclast medication.  This should be done for 5 years.  We should check bone density again in 2 years.   PDMP not reviewed this encounter. No orders of the defined types were placed in this encounter.  No orders of the defined types were placed in this encounter.    Discussed warning signs or symptoms. Please see discharge instructions. Patient expresses understanding.   The above documentation has been reviewed and is accurate and complete Artist Lloyd, M.D. Total encounter time 30 minutes including face-to-face time with the patient and, reviewing past medical record, and charting on the date of service.

## 2024-08-30 NOTE — Telephone Encounter (Signed)
 Plan to start Reclast. Order placed through the [facility]

## 2024-09-06 ENCOUNTER — Emergency Department (HOSPITAL_BASED_OUTPATIENT_CLINIC_OR_DEPARTMENT_OTHER)
Admission: EM | Admit: 2024-09-06 | Discharge: 2024-09-06 | Disposition: A | Discharge status: Home / Self Care | Attending: Emergency Medicine | Admitting: Emergency Medicine

## 2024-09-06 ENCOUNTER — Encounter (HOSPITAL_BASED_OUTPATIENT_CLINIC_OR_DEPARTMENT_OTHER): Payer: Self-pay | Admitting: Emergency Medicine

## 2024-09-06 ENCOUNTER — Emergency Department (HOSPITAL_BASED_OUTPATIENT_CLINIC_OR_DEPARTMENT_OTHER)

## 2024-09-06 DIAGNOSIS — I700 Atherosclerosis of aorta: Secondary | ICD-10-CM | POA: Diagnosis not present

## 2024-09-06 DIAGNOSIS — Z7982 Long term (current) use of aspirin: Secondary | ICD-10-CM | POA: Insufficient documentation

## 2024-09-06 DIAGNOSIS — R0781 Pleurodynia: Secondary | ICD-10-CM | POA: Insufficient documentation

## 2024-09-06 MED ORDER — TRAMADOL HCL 50 MG PO TABS: 50.0000 mg | ORAL_TABLET | Freq: Four times a day (QID) | ORAL | 0 refills | Status: DC | PRN

## 2024-09-06 MED ORDER — LIDOCAINE 5 % EX PTCH: 1.0000 | MEDICATED_PATCH | CUTANEOUS | 0 refills | Status: DC

## 2024-09-06 NOTE — ED Triage Notes (Signed)
 Pt reports RT side rib pain x 1 wk; sts she had a sports medicine manipulation and felt something squish; has had pain there since then

## 2024-09-06 NOTE — ED Provider Notes (Signed)
 [place] [facility] AT [facility] Provider Note   CSN: [id number] Arrival date & time: 09/06/24  1526     Patient presents with: Rib Injury   Christine [name] is a 73 y.o. female.   Patient is a 73 year old female who presents with right sided rib pain.  On September 16, she was seen by her sports medicine doctor for her arthritis pains and she was doing a massage technique on her right back and rib area.  She felt a pop or squish at the time and had some pain in her right rib.  She said the pain is gotten worse over time.  It hurts when she does certain movements and it hurts when she takes a really deep breath.  She denies any shortness of breath.  No abdominal pain.  No nausea or vomiting.  No dizziness.  She is not on anticoagulants.       Prior to Admission medications   Medication Sig Start Date End Date Taking? Authorizing Provider  lidocaine  (LIDODERM ) 5 % Place 1 patch onto the skin daily. Remove & Discard patch within 12 hours or as directed by MD 09/06/24  Yes Lenor Hollering, MD  traMADol  (ULTRAM ) 50 MG tablet Take 1 tablet (50 mg total) by mouth every 6 (six) hours as needed. 09/06/24  Yes Lenor Hollering, MD  acetaminophen  (TYLENOL ) 500 MG tablet Take 500 mg by mouth as needed.    [provider]  aspirin  81 MG tablet Take 81 mg by mouth daily.    [provider]  atorvastatin  (LIPITOR) 40 MG tablet Take 1 tablet (40 mg total) by mouth daily. 06/30/24   [name], MD  cephALEXin  (KEFLEX ) 250 MG capsule TAKE 1 CAPSULE BY MOUTH EVERY DAY 03/19/24   [name], MD  DULoxetine  (CYMBALTA ) 30 MG capsule Take 30 mg by mouth daily.    [provider]  DULoxetine  (CYMBALTA ) 60 MG capsule Take 60 mg by mouth daily. Taking 90 mg daily    [provider]  fluticasone  (FLONASE ) 50 MCG/ACT nasal spray Nasal; Duration: 90 Days    [provider]  glucose blood (ONETOUCH ULTRA) test strip 1 each by Other  route in the morning and at bedtime. Use as instructed 06/30/24   [name], MD  Inclisiran Sodium  (LEQVIO  [place]) Inject into the skin.    [provider]  Lancets [facility] ULTRASOFT) lancets Check blood sugar once daily. Dx code: 250.00 06/30/24   [name], MD  LORazepam  (ATIVAN ) 0.5 MG tablet Take 0.5 mg by mouth as needed for anxiety.    [provider]  metoprolol  succinate (TOPROL -XL) 25 MG 24 hr tablet Take 0.5 tablets (12.5 mg total) by mouth daily. 06/30/24   [name], MD  omeprazole  (PRILOSEC) 20 MG capsule TAKE 1 CAPSULE BY MOUTH EVERY DAY 06/30/24   [name], MD  potassium chloride  (KLOR-CON  M) 10 MEQ tablet TAKE 1 TABLET BY MOUTH EVERY DAY 07/26/24   [name], MD  Semaglutide , 2 MG/DOSE, (OZEMPIC , 2 MG/DOSE,) 8 MG/3ML SOPN Inject 2 mg into the skin once a week. Gets from pt assist 02/09/24   [name], MD  tiZANidine  (ZANAFLEX ) 4 MG tablet TAKE 1 TABLET BY MOUTH AT BEDTIME. Patient taking differently: Take 4 mg by mouth as needed for muscle spasms. 06/18/24   Claudene Arthea HERO, DO  traZODone  (DESYREL ) 50 MG tablet TAKE 1/2 TO 1 TABLET BY MOUTH AT BEDTIME AS NEEDED FOR  SLEEP 09/09/23   Webb, Padonda B, FNP  vitamin B-12 (CYANOCOBALAMIN ) 1000 MCG tablet Take 1,000 mcg by mouth daily.    [provider]  Vitamin D , Ergocalciferol , (DRISDOL ) 1.25 MG (50000 UNIT) CAPS capsule TAKE 1 CAPSULE BY MOUTH ONE TIME PER WEEK 07/04/24   Smith, Zachary M, DO    Allergies: Vioxx [rofecoxib], Prednisone , Colesevelam hcl, Ezetimibe , Macrodantin  [nitrofurantoin  macrocrystal], Nitrofurantoin , Sulfa  antibiotics, and Colesevelam    Review of Systems  Constitutional:  Negative for fatigue and fever.  Respiratory:  Negative for chest tightness and shortness of breath.   Cardiovascular:  Positive for chest pain. Negative for leg swelling.  Gastrointestinal:  Negative for abdominal pain, nausea and vomiting.  Musculoskeletal:  Negative for back  pain.  Skin:  Negative for wound.  Neurological:  Negative for light-headedness.    Updated Vital Signs BP (!) 180/68 (BP Location: Right Arm)   Pulse 70   Temp 97.7 F (36.5 C) (Oral)   Resp 18   Ht 5' 5 (1.651 m)   Wt 64.9 kg   LMP  (LMP Unknown)   SpO2 98%   BMI 23.80 kg/m   Physical Exam Constitutional:      Appearance: She is well-developed.  HENT:     Head: Normocephalic and atraumatic.  Eyes:     Pupils: Pupils are equal, round, and reactive to light.  Cardiovascular:     Rate and Rhythm: Normal rate and regular rhythm.     Heart sounds: Normal heart sounds.  Pulmonary:     Effort: Pulmonary effort is normal. No respiratory distress.     Breath sounds: Normal breath sounds. No wheezing or rales.  Chest:     Chest wall: Tenderness (Positive tenderness to the right anterior lateral ribs at the 6/seventh rib area.  No crepitus or deformity, no visualized external trauma) present.  Abdominal:     General: Bowel sounds are normal.     Palpations: Abdomen is soft.     Tenderness: There is no abdominal tenderness. There is no guarding or rebound.  Musculoskeletal:        General: Normal range of motion.     Cervical back: Normal range of motion and neck supple.     Comments: No edema or calf tenderness  Lymphadenopathy:     Cervical: No cervical adenopathy.  Skin:    General: Skin is warm and dry.     Findings: No rash.  Neurological:     Mental Status: She is alert and oriented to person, place, and time.     (all labs ordered are listed, but only abnormal results are displayed) Labs Reviewed - No data to display  EKG: None  Radiology: DG Ribs Unilateral W/Chest Right Result Date: 09/06/2024 CLINICAL DATA:  Pain after manipulation. Right lower rib pain for 1 week. EXAM: RIGHT RIBS AND CHEST - 3+ VIEW COMPARISON:  08/06/2021 FINDINGS: Normal heart size and pulmonary vascularity. No focal airspace disease or consolidation in the lungs. No blunting of  costophrenic angles. No pneumothorax. Mediastinal contours appear intact. Calcification of the aorta. Right ribs appear intact. No acute displaced fractures are identified. No focal bone lesion or bone destruction. Soft tissues are unremarkable. IMPRESSION: 1. No evidence of active pulmonary disease. 2. Negative right ribs. Electronically Signed   By: [name] Gravely M.D.   On: 09/06/2024 16:14     Procedures   Medications Ordered in the ED - No data to display  Medical Decision Making Amount and/or Complexity of Data Reviewed Radiology: ordered.  Risk Prescription drug management.   Patient is a 73 year old who presents with pain in her right ribs after a manipulation 1 week ago.   Differential diagnosis includes ACS, PE, pneumothorax, rib fracture, aortic dissection, gallstones, abdominal source of the pain.    Her lungs are clear.  She has no hypoxia.  Is reproducible on palpation.  Also reproducible with certain movements.  Chest x-ray was performed which was interpreted by me and confirmed by the radiologist to show no evidence of pneumonia/pneumothorax.  No obvious rib fracture.  She does not have any pain in the abdomen which would be more concerning for an abdominal etiology.  No other symptoms that would be more concerning for PE.  Suspect she has an occult rib fracture.  Advised on symptomatic care.  Will prescribe her Lidoderm  patches and was initially going to try Mobic  but she reports an allergy to Vioxx.  Will prescribe tramadol .  Was encouraged to follow-up with her PCP in the next few days for recheck.  Return precautions were given.  Her blood pressure was a little bit elevated and was advised that she needs to have this rechecked by her PCP.       Final diagnoses:  Rib pain on right side    ED Discharge Orders          Ordered    traMADol  (ULTRAM ) 50 MG tablet  Every 6 hours PRN        09/06/24 1714    lidocaine  (LIDODERM ) 5 %   Every 24 hours        09/06/24 1714               Lenor Hollering, MD 09/06/24 1734

## 2024-09-06 NOTE — Discharge Instructions (Addendum)
 Make an appointment to follow-up with your primary care doctor for recheck in the next few days.  Your blood pressure also needs to be rechecked by your primary care doctor.  Return to the emergency room if you have any worsening symptoms.

## 2024-09-07 ENCOUNTER — Encounter: Payer: Self-pay | Admitting: Family Medicine

## 2024-09-11 DIAGNOSIS — N6321 Unspecified lump in the left breast, upper outer quadrant: Secondary | ICD-10-CM | POA: Diagnosis not present

## 2024-09-11 DIAGNOSIS — N644 Mastodynia: Secondary | ICD-10-CM | POA: Diagnosis not present

## 2024-09-11 DIAGNOSIS — R9230 Dense breasts, unspecified: Secondary | ICD-10-CM | POA: Diagnosis not present

## 2024-09-13 ENCOUNTER — Ambulatory Visit (INDEPENDENT_AMBULATORY_CARE_PROVIDER_SITE_OTHER)

## 2024-09-13 VITALS — BP 165/86 | HR 72 | Temp 97.6°F | Resp 18 | Ht 65.0 in | Wt 134.2 lb

## 2024-09-13 DIAGNOSIS — M810 Age-related osteoporosis without current pathological fracture: Secondary | ICD-10-CM

## 2024-09-13 DIAGNOSIS — Z8739 Personal history of other diseases of the musculoskeletal system and connective tissue: Secondary | ICD-10-CM

## 2024-09-13 DIAGNOSIS — E1169 Type 2 diabetes mellitus with other specified complication: Secondary | ICD-10-CM

## 2024-09-13 DIAGNOSIS — I2510 Atherosclerotic heart disease of native coronary artery without angina pectoris: Secondary | ICD-10-CM

## 2024-09-13 MED ORDER — ACETAMINOPHEN 325 MG PO TABS
650.0000 mg | ORAL_TABLET | Freq: Once | ORAL | Status: AC
  Administered 2024-09-13: 650 mg via ORAL
  Filled 2024-09-13: qty 2

## 2024-09-13 MED ORDER — INCLISIRAN SODIUM 284 MG/1.5ML ~~LOC~~ SOSY: 284.0000 mg | PREFILLED_SYRINGE | Freq: Once | SUBCUTANEOUS | Status: DC

## 2024-09-13 MED ORDER — DIPHENHYDRAMINE HCL 25 MG PO CAPS
25.0000 mg | ORAL_CAPSULE | Freq: Once | ORAL | Status: AC
  Administered 2024-09-13: 25 mg via ORAL
  Filled 2024-09-13: qty 1

## 2024-09-13 MED ORDER — SODIUM CHLORIDE 0.9 % IV SOLN: INTRAVENOUS | Status: DC

## 2024-09-13 MED ORDER — ZOLEDRONIC ACID 5 MG/100ML IV SOLN
5.0000 mg | Freq: Once | INTRAVENOUS | Status: AC
  Administered 2024-09-13: 5 mg via INTRAVENOUS
  Filled 2024-09-13: qty 100

## 2024-09-13 NOTE — Patient Instructions (Signed)

## 2024-09-13 NOTE — Progress Notes (Signed)
 Diagnosis: Osteoporosis  Provider:  Lonna Coder MD  Procedure: IV Infusion  IV Type: Peripheral, IV Location: R Antecubital  Reclast (Zolendronic Acid), Dose: 5 mg  Infusion Start Time: 1158  Infusion Stop Time: 1228   Post Infusion IV Care: Observation period completed and Peripheral IV Discontinued  Discharge: Condition: Good, Destination: Home . AVS Provided  Performed by:  Maximiano JONELLE Pouch, LPN

## 2024-09-15 NOTE — Telephone Encounter (Signed)
 Christine [name], CPhT    08/30/24  1:17 PM Note Auth Submission: NO AUTH NEEDED Site of care: Site of care: [facility] Payer: Medicare A/B Medication & CPT/J Code(s) submitted: Reclast (Zolendronic acid) [id number] Diagnosis Code: Z87.39 Route of submission (phone, fax, portal):  Phone # Fax # Auth type: Buy/Bill PB Units/visits requested: 5mg  x 1 dose Reference number:  Approval from: 08/30/24 to 01/13/25

## 2024-09-16 ENCOUNTER — Other Ambulatory Visit: Payer: Self-pay | Admitting: Family Medicine

## 2024-09-25 ENCOUNTER — Telehealth: Payer: Self-pay

## 2024-09-25 NOTE — Telephone Encounter (Signed)
 Spoke with and informed patient about Ozempic  being present at office. Pt stated will wait to pick up when here for appointment on 10/20.

## 2024-09-29 NOTE — Telephone Encounter (Signed)
 Last Reclast infusion 09/13/24 Next Reclast infusion due 09/13/25

## 2024-09-29 NOTE — Telephone Encounter (Signed)
 Called and spoke with patient. She notes having flu-like sx since getting Reclast infusion. Felt feverish but never had a fever. Continues to feel weak and tired. Also still has a cough.   Has been taking Mucinex and Benadryl  and cough medicine.   Forwarding to Dr. Joane to review and advise.

## 2024-10-02 ENCOUNTER — Encounter: Payer: Self-pay | Admitting: Family Medicine

## 2024-10-02 ENCOUNTER — Ambulatory Visit: Admitting: Family Medicine

## 2024-10-02 VITALS — BP 136/70 | HR 81 | Temp 97.8°F | Ht 65.0 in | Wt 131.8 lb

## 2024-10-02 DIAGNOSIS — R000 Tachycardia, unspecified: Secondary | ICD-10-CM

## 2024-10-02 DIAGNOSIS — E1165 Type 2 diabetes mellitus with hyperglycemia: Secondary | ICD-10-CM

## 2024-10-02 DIAGNOSIS — Z7985 Long-term (current) use of injectable non-insulin antidiabetic drugs: Secondary | ICD-10-CM | POA: Diagnosis not present

## 2024-10-02 DIAGNOSIS — D352 Benign neoplasm of pituitary gland: Secondary | ICD-10-CM | POA: Diagnosis not present

## 2024-10-02 DIAGNOSIS — J209 Acute bronchitis, unspecified: Secondary | ICD-10-CM

## 2024-10-02 MED ORDER — AZITHROMYCIN 250 MG PO TABS: ORAL_TABLET | ORAL | 0 refills | Status: AC

## 2024-10-02 MED ORDER — GUAIFENESIN-CODEINE 100-10 MG/5ML PO SOLN: 5.0000 mL | Freq: Four times a day (QID) | ORAL | 0 refills | Status: DC | PRN

## 2024-10-02 MED ORDER — PEN NEEDLES 32G X 4 MM MISC: 1.0000 | 1 refills | Status: AC

## 2024-10-02 MED ORDER — BENZONATATE 100 MG PO CAPS: 100.0000 mg | ORAL_CAPSULE | Freq: Three times a day (TID) | ORAL | 0 refills | Status: DC | PRN

## 2024-10-02 NOTE — Patient Instructions (Addendum)
 It was very nice to see you today!  Micro dose the ozempic  to 1.5mg /week so go back 18 clicks   PLEASE NOTE:  If you had any lab tests please let us  know if you have not heard back within a few days. You may see your results on MyChart before we have a chance to review them but we will give you a call once they are reviewed by us . If we ordered any referrals today, please let us  know if you have not heard from their office within the next week.   Please try these tips to maintain a healthy lifestyle:  Eat most of your calories during the day when you are active. Eliminate processed foods including packaged sweets (pies, cakes, cookies), reduce intake of potatoes, white bread, white pasta, and white rice. Look for whole grain options, oat flour or almond flour.  Each meal should contain half fruits/vegetables, one quarter protein, and one quarter carbs (no bigger than a computer mouse).  Cut down on sweet beverages. This includes juice, soda, and sweet tea. Also watch fruit intake, though this is a healthier sweet option, it still contains natural sugar! Limit to 3 servings daily.  Drink at least 1 glass of water with each meal and aim for at least 8 glasses per day  Exercise at least 150 minutes every week.

## 2024-10-02 NOTE — Progress Notes (Signed)
 Subjective:     Patient ID: Christine [name], female    DOB: 10-Jan-1951, 73 y.o.   MRN: [id number]  Chief Complaint  Patient presents with   Diabetes    48mo f/u; not feeling the best today; been sick last couple of weeks    Discussed the use of AI scribe software for clinical note transcription with the patient, who gave verbal consent to proceed. 18 is .5  History of Present Illness Christine [name] is a 73 year old female who presents for f/u DM, palpitations with persistent flu-like symptoms .  She has been experiencing flu-like symptoms for two weeks, including congestion, a persistent cough, and a sore throat. The symptoms began around October 5th or 6th, following a Reclast infusion on October 1st. The cough is severe, with episodes lasting about ten minutes and leading to exhaustion. Home covid was negative.  No sob  She has experienced  weight loss, dropping from 133 pounds in September to 128 pounds recently, attributed to a lack of appetite and reduced food intake.   Despite the illness, there have been no recent changes in her diabetes management, although her blood sugar levels have been around 150 mg/dL in the morning. She is currently taking Ozempic  for diabetes management, with a self-calculated dosing strategy. This was last pt assist supply and concerned w/cost in future.  Willing to go back on insulin  if needed.   Her medical history includes a pituitary tumor, a meningioma on her breastbone, and a cyst on her liver. She mentions slightly elevated prolactin levels. She has been drinking more water than usual due to feeling dried out and has been taking Mucinex, Benadryl , Tylenol , and diabetic cough medicine to manage her symptoms. She has not been taking her prescribed potassium supplement, which she plans to resume.  She reports low energy levels, which she attributes to her recent illness and lack of physical activity. She experiences tingling in her fingers and  weakness in her legs and arms after prolonged standing. No shortness of breath or fever, but she feels shaky and weak.    Health Maintenance Due  Topic Date Due   Zoster Vaccines- Shingrix (1 of 2) Never done   DTaP/Tdap/Td (2 - Tdap) 06/08/2019   Influenza Vaccine  07/14/2024   OPHTHALMOLOGY EXAM  07/15/2024   COVID-19 Vaccine (5 - 2025-26 season) 08/14/2024   Medicare Annual Wellness (AWV)  09/01/2024    Past Medical History:  Diagnosis Date   Acute bursitis of right shoulder 08/29/2018   Right shoulder injected in August 29, 2018   Allergic rhinitis    Closed displaced fracture of fifth metatarsal bone of left foot 03/16/2017   DEPRESSIVE DISORDER NOT ELSEWHERE CLASSIFIED 03/27/2010   Qualifier: Diagnosis of  By: [name], [name]   [name], NP , [facility] [place]     Diabetes (HCC) 04/02/2016   Diabetes mellitus    Essential hypertension 03/14/2008   Qualifier: Diagnosis of  By: [name], [name]     Fibromyalgia 08/12/2011   Diagnosed by [name] , Rheumatologist 2010    GERD (gastroesophageal reflux disease) 12/25/2016   Gluteal tendonitis of right buttock 10/19/2017   Injected in October 19, 2017 Injected October 07, 2018   Greater trochanteric bursitis of left hip 06/17/2016   Greater trochanteric bursitis of right hip 11/27/2015   Injected 11/27/2015    History of recurrent UTIs    [name]   HTN (hypertension)    Hx  of osteopenia 10/11/2012   [facility] DEXA; ordered by [name] Lowest T score: -2.1 @ lumbar spine @ [facility] 11/01/12; decrease of 8.5% vs 05/2009. There is 9.2% risk over 10 yrs History fracture left elbow @ 4 Fractured left wrist , right elbow and nose post fall July/18/2013. Dr. [name] No FH Osteoporosis No PMH of bisphosphonate therapy (generic Fosamax  Rxed but not filled  due to polypharmacy )    Hyperlipidemia    Hyperlipidemia associated with type 2 diabetes mellitus (HCC) 01/18/2007   Qualifier: Diagnosis of  By: [name]   With DM LDL goal = < 100, ideally < 70. Mi in father @ 93; 2 brothers @ 69 & 13     Lumbar radiculopathy 04/11/2008   Qualifier: Diagnosis of  By: [name], [name] Dock well to epidural 01/12/2017  repeat epidural given May 2018   Migraine headache    quiescent   Nonallopathic lesion of cervical region 07/19/2014   Nonallopathic lesion of lumbosacral region 01/15/2016   Nonallopathic lesion of sacral region 06/27/2018   Nonallopathic lesion-rib cage 09/26/2014   Palpitations 04/15/2011   Rotator cuff impingement syndrome of left shoulder 05/11/2014   Seasonal allergic rhinitis 03/21/2012   Sinusitis, chronic 04/20/2016   Sleep disorder    Dr Dohmier   Subluxation of peroneal tendon of right foot 11/10/2017   TIA (transient ischemic attack)    TRANSIENT ISCHEMIC ATTACKS, HX OF 02/02/2008   Qualifier: Diagnosis of  By: Claudene GUSS Heron SERITA ADVRS EFF UNS RX MEDICINAL&BIOLOGICAL SBSTNC 02/02/2008   Qualifier: Diagnosis of  By: [name], [name] [name] AND MYOSITIS 02/02/2008   Qualifier: Diagnosis of  By: [name], [name]     UTI (urinary tract infection) 10/05/2014   Viral upper respiratory tract infection with cough 12/31/2014   Vitamin D  deficiency 05/25/2008   Qualifier: Diagnosis of  By: [name], [name]      Past Surgical History:  Procedure Laterality Date   COLONOSCOPY     Dr Avram; hemorrhoids   CORONARY ANGIOPLASTY  12/15/1995   for chest pain- negative    FOOT MASS EXCISION Left 04/08/2023   KNEE ARTHROSCOPY Left 2023   POLYPECTOMY  12/14/2000   benign, hyperplastic polyp; rectal bleeding 2008   TONSILLECTOMY     TOTAL ABDOMINAL HYSTERECTOMY  12/14/1981   BSO for endometriosis   WISDOM TOOTH EXTRACTION       Current Outpatient Medications:    acetaminophen  (TYLENOL ) 500 MG tablet, Take 500 mg by mouth as needed., Disp: , Rfl:    aspirin  81 MG tablet, Take 81 mg by mouth daily., Disp: , Rfl:    atorvastatin  (LIPITOR) 40 MG tablet, Take 1  tablet (40 mg total) by mouth daily., Disp: 90 tablet, Rfl: 3   azithromycin (ZITHROMAX) 250 MG tablet, Take 2 tablets on day 1, then 1 tablet daily on days 2 through 5, Disp: 6 tablet, Rfl: 0   benzonatate  (TESSALON  PERLES) 100 MG capsule, Take 1 capsule (100 mg total) by mouth 3 (three) times daily as needed., Disp: 20 capsule, Rfl: 0   cephALEXin  (KEFLEX ) 250 MG capsule, TAKE 1 CAPSULE BY MOUTH EVERY DAY, Disp: 90 capsule, Rfl: 1   DULoxetine  (CYMBALTA ) 30 MG capsule, Take 30 mg by mouth daily., Disp: , Rfl:    DULoxetine  (CYMBALTA ) 60 MG capsule, Take 60 mg by mouth daily. Taking 90 mg daily, Disp: , Rfl:    fluticasone  (FLONASE ) 50 MCG/ACT nasal spray, Nasal;  Duration: 90 Days, Disp: , Rfl:    glucose blood (ONETOUCH ULTRA) test strip, 1 each by Other route in the morning and at bedtime. Use as instructed, Disp: 200 strip, Rfl: 3   guaiFENesin-codeine 100-10 MG/5ML syrup, Take 5 mLs by mouth every 6 (six) hours as needed for cough., Disp: 118 mL, Rfl: 0   Inclisiran Sodium  (LEQVIO  [place]), Inject into the skin., Disp: , Rfl:    Insulin  Pen Needle (PEN NEEDLES) 32G X 4 MM MISC, 1 each by Does not apply route once a week., Disp: 30 each, Rfl: 1   Lancets (ONETOUCH ULTRASOFT) lancets, Check blood sugar once daily. Dx code: 250.00, Disp: 100 each, Rfl: 12   lidocaine  (LIDODERM ) 5 %, Place 1 patch onto the skin daily. Remove & Discard patch within 12 hours or as directed by MD, Disp: 30 patch, Rfl: 0   LORazepam  (ATIVAN ) 0.5 MG tablet, Take 0.5 mg by mouth as needed for anxiety., Disp: , Rfl:    metoprolol  succinate (TOPROL -XL) 25 MG 24 hr tablet, Take 0.5 tablets (12.5 mg total) by mouth daily., Disp: 45 tablet, Rfl: 1   omeprazole  (PRILOSEC) 20 MG capsule, TAKE 1 CAPSULE BY MOUTH EVERY DAY, Disp: 90 capsule, Rfl: 3   potassium chloride  (KLOR-CON  M) 10 MEQ tablet, TAKE 1 TABLET BY MOUTH EVERY DAY, Disp: 90 tablet, Rfl: 1   Semaglutide , 2 MG/DOSE, (OZEMPIC , 2 MG/DOSE,) 8 MG/3ML SOPN, Inject 2 mg into  the skin once a week. Gets from pt assist, Disp: 2 mL, Rfl: 0   tiZANidine  (ZANAFLEX ) 4 MG tablet, TAKE 1 TABLET BY MOUTH AT BEDTIME. (Patient taking differently: Take 4 mg by mouth as needed for muscle spasms.), Disp: 90 tablet, Rfl: 1   traMADol  (ULTRAM ) 50 MG tablet, Take 1 tablet (50 mg total) by mouth every 6 (six) hours as needed., Disp: 10 tablet, Rfl: 0   traZODone  (DESYREL ) 50 MG tablet, TAKE 1/2 TO 1 TABLET BY MOUTH AT BEDTIME AS NEEDED FOR SLEEP, Disp: 90 tablet, Rfl: 1   vitamin B-12 (CYANOCOBALAMIN ) 1000 MCG tablet, Take 1,000 mcg by mouth daily., Disp: , Rfl:    Vitamin D , Ergocalciferol , (DRISDOL ) 1.25 MG (50000 UNIT) CAPS capsule, TAKE 1 CAPSULE BY MOUTH ONE TIME PER WEEK, Disp: 12 capsule, Rfl: 0  Allergies  Allergen Reactions   Vioxx [Rofecoxib] Other (See Comments)    Excess BP which caused TIA    Prednisone  Other (See Comments)    Mental status changes with high-dose oral agent. Tolerates epidural steroid injections.  No associated rash or fever   Colesevelam Hcl Other (See Comments)   Ezetimibe  Other (See Comments)    myalgias   Macrodantin  [Nitrofurantoin  Macrocrystal] Hives   Nitrofurantoin  Other (See Comments)   Sulfa  Antibiotics Other (See Comments)    Hives   Colesevelam Other (See Comments)    Leg Pain   ROS neg/noncontributory except as noted HPI/below      Objective:     BP 136/70   Pulse 81   Temp 97.8 F (36.6 C)   Ht 5' 5 (1.651 m)   Wt 131 lb 12.8 oz (59.8 kg)   LMP  (LMP Unknown)   SpO2 99%   BMI 21.93 kg/m  Wt Readings from Last 3 Encounters:  10/02/24 131 lb 12.8 oz (59.8 kg)  09/13/24 134 lb 3.2 oz (60.9 kg)  09/06/24 143 lb (64.9 kg)    Physical Exam GENERAL: Well developed, well nourished, no acute distress. HEAD EYES EARS NOSE THROAT: Normocephalic, atraumatic, conjunctiva not  injected, sclera nonicteric. TM WNL B, OP moist, no exudates  CARDIAC: Regular rate and rhythm, S1 S2 present, no murmur, dorsalis pedis 1 plus  bilaterally. NECK: Supple, no thyromegaly, + submand lymphadenopathy present, no carotid bruits. LUNGS: Clear to auscultation bilaterally, no wheezes. ABDOMEN: Bowel sounds present, soft, non-tender, non-distended, no hepatosplenomegaly, no masses. EXTREMITIES: No edema. MUSCULOSKELETAL: No gross abnormalities. NEUROLOGICAL: Alert and oriented x3, cranial nerves II through XII intact. PSYCHIATRIC: Normal mood, good eye contact.       Assessment & Plan:  Type 2 diabetes mellitus with hyperglycemia, without long-term current use of insulin  (HCC)  Tachycardia  Acute bronchitis, unspecified organism  Pituitary microadenoma (HCC)  Long-term current use of injectable noninsulin antidiabetic medication  Other orders -     Azithromycin; Take 2 tablets on day 1, then 1 tablet daily on days 2 through 5  Dispense: 6 tablet; Refill: 0 -     Benzonatate ; Take 1 capsule (100 mg total) by mouth 3 (three) times daily as needed.  Dispense: 20 capsule; Refill: 0 -     guaiFENesin-Codeine; Take 5 mLs by mouth every 6 (six) hours as needed for cough.  Dispense: 118 mL; Refill: 0 -     Pen Needles; 1 each by Does not apply route once a week.  Dispense: 30 each; Refill: 1    Assessment and Plan Assessment & Plan Acute upper respiratory infection with persistent cough   She has experienced a persistent cough and flu-like symptoms for two weeks following a Reclast infusion. Although the COVID test was negative, symptoms suggest possible COVID or RSV. Prescribe Z-Pak and Tessalon  Perles for cough management. Use narcotic cough syrup for severe coughing spells. Advise against using multiple cough syrups simultaneously. Pdmp checked  Type 2 diabetes mellitus  - chronic.  Not ideally controlled w/hyperglycemia Blood sugar levels are slightly elevated, with morning readings up to 150 mg/dL. She is currently on Ozempic  2mg , and the dose will be adjusted to 1.5 mg per week by back clicking 18 clicks from 2.0 mg  to manage blood sugar and weight. There are concerns about affordability and the potential need to return to insulin  if Ozempic  is not effective or affordable. A microdosing strategy was discussed to extend the duration of the Ozempic  supply. Provide needles for administration. Monitor blood sugar levels and adjust treatment as needed. Consider insulin  therapy if Ozempic  is not effective or affordable.  Hypokalemia   Potassium level is at the lower end of normal at 3.5 mmol/L. Symptoms of weakness and fatigue may be related to low potassium levels. Resume potassium supplementation to address this.  Pituitary tumor   She has a pituitary tumor with slightly elevated prolactin levels. Follow-up with a neurologist is scheduled for December 1st. Contact the endocrinologist for follow-up on prolactin levels and potential medication. Monitoring is ongoing, with no immediate intervention planned.  Tachycardia-chronic.  Controlled.  Continue metoprolol      Return in about 3 months (around 01/02/2025) for chronic follow-up.  Jenkins CHRISTELLA Carrel, MD

## 2024-10-03 NOTE — Telephone Encounter (Signed)
 That is odd.  It is possible you have of a virus with the cough.  Please let me know if symptoms do not improve.  I would recommend treating your symptoms with Tylenol  and over-the-counter medications like you are.

## 2024-10-05 ENCOUNTER — Telehealth: Payer: Self-pay

## 2024-10-05 ENCOUNTER — Other Ambulatory Visit (HOSPITAL_COMMUNITY): Payer: Self-pay

## 2024-10-05 NOTE — Telephone Encounter (Signed)
 Pharmacy Patient Advocate Encounter   Received notification from Onbase that prior authorization for Embecta Pen Needle Nano 2 Gen 32G X 4 MM  is required/requested.   Insurance verification completed.   The patient is insured through [facility].   Per test claim: PA required; PA submitted to above mentioned insurance via Phone Key/confirmation #/EOC [id number] Status is pending

## 2024-10-09 ENCOUNTER — Other Ambulatory Visit (HOSPITAL_COMMUNITY): Payer: Self-pay

## 2024-10-09 ENCOUNTER — Other Ambulatory Visit: Payer: Self-pay | Admitting: Family Medicine

## 2024-10-09 DIAGNOSIS — E1165 Type 2 diabetes mellitus with hyperglycemia: Secondary | ICD-10-CM

## 2024-10-09 NOTE — Telephone Encounter (Signed)
 Pharmacy Patient Advocate Encounter  Received notification from [facility] that Prior Authorization for Embecta Pen Needle Nano 2 Gen 32G X 4 MM  has been APPROVED from 12/15/23 to 10/05/25   PA #/Case ID/Reference #: [id number]

## 2024-10-10 NOTE — Progress Notes (Unsigned)
 [facility] [address] [place] [address] Phone: [phone] Subjective:   Christine [name], am serving as a scribe for Dr. Arthea Claudene.  I'm seeing this patient by the request  of:  [name], MD  CC: Back and neck pain  YEP:Dlagzrupcz  Christine [name] is a 73 y.o. female coming in with complaint of back and neck pain. OMT 08/29/2024. Patient states that she is the same as last visit.   Medications patient has been prescribed: Zanaflex , Vit D  Taking:      Since we have seen patient was having rib pain on the right side.  Unfortunately bad enough that went to the emergency room.  At that time x-rays were taken.  No sign of any rib fracture.  Patient was given Lidoderm  patches.   Reviewed prior external information including notes and imaging from previsou exam, outside providers and external EMR if available.   As well as notes that were available from care everywhere and other healthcare systems.  Past medical history, social, surgical and family history all reviewed in electronic medical record.  No pertanent information unless stated regarding to the chief complaint.   Past Medical History:  Diagnosis Date   Acute bursitis of right shoulder 08/29/2018   Right shoulder injected in August 29, 2018   Allergic rhinitis    Closed displaced fracture of fifth metatarsal bone of left foot 03/16/2017   DEPRESSIVE DISORDER NOT ELSEWHERE CLASSIFIED 03/27/2010   Qualifier: Diagnosis of  By: [name], [name]   [name], NP , [facility] [place]     Diabetes (HCC) 04/02/2016   Diabetes mellitus    Essential hypertension 03/14/2008   Qualifier: Diagnosis of  By: [name], [name]     Fibromyalgia 08/12/2011   Diagnosed by [name] , Rheumatologist 2010    GERD (gastroesophageal reflux disease) 12/25/2016   Gluteal tendonitis of right buttock 10/19/2017   Injected in October 19, 2017 Injected October 07, 2018    Greater trochanteric bursitis of left hip 06/17/2016   Greater trochanteric bursitis of right hip 11/27/2015   Injected 11/27/2015    History of recurrent UTIs    [name]   HTN (hypertension)    Hx of osteopenia 10/11/2012   [facility]EXA; ordered by [name] Lowest T score: -2.1 @ lumbar spine @ [facility] 11/01/12; decrease of 8.5% vs 05/2009. There is 9.2% risk over 10 yrs History fracture left elbow @ 4 Fractured left wrist , right elbow and nose post fall July/18/2013. Dr. [name] No FH Osteoporosis No PMH of bisphosphonate therapy (generic Fosamax  Rxed but not filled  due to polypharmacy )    Hyperlipidemia    Hyperlipidemia associated with type 2 diabetes mellitus (HCC) 01/18/2007   Qualifier: Diagnosis of  By: [name]  With DM LDL goal = < 100, ideally < 70. Mi in father @ 71; 2 brothers @ 59 & 42     Lumbar radiculopathy 04/11/2008   Qualifier: Diagnosis of  By: [name], [name] Dock well to epidural 01/12/2017  repeat epidural given May 2018   Migraine headache    quiescent   Nonallopathic lesion of cervical region 07/19/2014   Nonallopathic lesion of lumbosacral region 01/15/2016   Nonallopathic lesion of sacral region 06/27/2018   Nonallopathic lesion-rib cage 09/26/2014   Palpitations 04/15/2011   Rotator cuff impingement syndrome of left shoulder 05/11/2014   Seasonal allergic rhinitis 03/21/2012   Sinusitis, chronic  04/20/2016   Sleep disorder    Dr Dohmier   Subluxation of peroneal tendon of right foot 11/10/2017   TIA (transient ischemic attack)    TRANSIENT ISCHEMIC ATTACKS, HX OF 02/02/2008   Qualifier: Diagnosis of  By: Claudene GUSS Heron SERITA ADVRS EFF UNS RX MEDICINAL&BIOLOGICAL SBSTNC 02/02/2008   Qualifier: Diagnosis of  By: [name], [name] [name] AND MYOSITIS 02/02/2008   Qualifier: Diagnosis of  By: [name], [name]     UTI (urinary tract infection) 10/05/2014   Viral upper respiratory tract infection with cough 12/31/2014   Vitamin D   deficiency 05/25/2008   Qualifier: Diagnosis of  By: [name], [name]      Allergies  Allergen Reactions   Vioxx [Rofecoxib] Other (See Comments)    Excess BP which caused TIA    Prednisone  Other (See Comments)    Mental status changes with high-dose oral agent. Tolerates epidural steroid injections.  No associated rash or fever   Colesevelam Hcl Other (See Comments)   Ezetimibe  Other (See Comments)    myalgias   Macrodantin  [Nitrofurantoin  Macrocrystal] Hives   Nitrofurantoin  Other (See Comments)   Sulfa  Antibiotics Other (See Comments)    Hives   Colesevelam Other (See Comments)    Leg Pain     Review of Systems:  No headache, visual changes, nausea, vomiting, diarrhea, constipation, dizziness, abdominal pain, skin rash, fevers, chills, night sweats, weight loss, swollen lymph nodes, body aches, joint swelling, chest pain, shortness of breath, mood changes. POSITIVE muscle aches  Objective  Blood pressure 122/72, height 5' 5 (1.651 m), weight 133 lb (60.3 kg).   General: No apparent distress alert and oriented x3 mood and affect normal, dressed appropriately.  HEENT: Pupils equal, extraocular movements intact  Respiratory: Patient's speak in full sentences and does not appear short of breath  Cardiovascular: No lower extremity edema, non tender, no erythema  Gait MSK:  Back does have some mild loss of lordosis noted.  Some tenderness to palpation in the paraspinal musculature.  Osteopathic findings  C3 flexed rotated and side bent right C5 flexed rotated and side bent left T3 extended rotated and side bent right inhaled rib T9 extended rotated and side bent left L3 flexed rotated and side bent right Sacrum right on right       Assessment and Plan:  Lumbar radiculopathy, right Did more muscle energy techniques at this time.  Discussed icing regimen and home exercises, discussed which activities to do and which ones to avoid.  Increase activity slowly.   Discussed icing regimen.  No change in medications.  Will be traveling for the holidays.  Will follow-up again in 2 months    Nonallopathic problems  Decision today to treat with OMT was based on Physical Exam  After verbal consent patient was treated with  ME, FPR techniques in cervical, rib, thoracic, lumbar, and sacral  areas  Patient tolerated the procedure well with improvement in symptoms  Patient given exercises, stretches and lifestyle modifications  See medications in patient instructions if given  Patient will follow up in 4-8 weeks     The above documentation has been reviewed and is accurate and complete Christine Millikan M Trigger Frasier, DO         Note: This dictation was prepared with Dragon dictation along with smaller phrase technology. Any transcriptional errors that result from this process are unintentional.

## 2024-10-11 ENCOUNTER — Ambulatory Visit

## 2024-10-11 ENCOUNTER — Ambulatory Visit (INDEPENDENT_AMBULATORY_CARE_PROVIDER_SITE_OTHER): Admitting: Family Medicine

## 2024-10-11 VITALS — BP 122/72 | Ht 65.0 in | Wt 133.0 lb

## 2024-10-11 DIAGNOSIS — R3911 Hesitancy of micturition: Secondary | ICD-10-CM | POA: Diagnosis not present

## 2024-10-11 DIAGNOSIS — M9908 Segmental and somatic dysfunction of rib cage: Secondary | ICD-10-CM

## 2024-10-11 DIAGNOSIS — M9904 Segmental and somatic dysfunction of sacral region: Secondary | ICD-10-CM

## 2024-10-11 DIAGNOSIS — R109 Unspecified abdominal pain: Secondary | ICD-10-CM | POA: Diagnosis not present

## 2024-10-11 DIAGNOSIS — I878 Other specified disorders of veins: Secondary | ICD-10-CM | POA: Diagnosis not present

## 2024-10-11 DIAGNOSIS — M9901 Segmental and somatic dysfunction of cervical region: Secondary | ICD-10-CM | POA: Diagnosis not present

## 2024-10-11 DIAGNOSIS — M9903 Segmental and somatic dysfunction of lumbar region: Secondary | ICD-10-CM

## 2024-10-11 DIAGNOSIS — M5416 Radiculopathy, lumbar region: Secondary | ICD-10-CM | POA: Diagnosis not present

## 2024-10-11 DIAGNOSIS — M9902 Segmental and somatic dysfunction of thoracic region: Secondary | ICD-10-CM | POA: Diagnosis not present

## 2024-10-11 DIAGNOSIS — M545 Low back pain, unspecified: Secondary | ICD-10-CM | POA: Diagnosis not present

## 2024-10-11 NOTE — Assessment & Plan Note (Signed)
 History of urinary tract infections, KUB ordered to further evaluate for any stones.

## 2024-10-11 NOTE — Assessment & Plan Note (Signed)
 Did more muscle energy techniques at this time.  Discussed icing regimen and home exercises, discussed which activities to do and which ones to avoid.  Increase activity slowly.  Discussed icing regimen.  No change in medications.  Will be traveling for the holidays.  Will follow-up again in 2 months

## 2024-10-11 NOTE — Patient Instructions (Signed)
 Have a great holiday season See me in 6-8 weeks

## 2024-10-13 ENCOUNTER — Ambulatory Visit: Payer: Self-pay | Admitting: Family Medicine

## 2024-10-22 ENCOUNTER — Other Ambulatory Visit: Payer: Self-pay

## 2024-10-22 ENCOUNTER — Emergency Department (HOSPITAL_BASED_OUTPATIENT_CLINIC_OR_DEPARTMENT_OTHER): Admission: EM | Admit: 2024-10-22 | Discharge: 2024-10-22 | Disposition: A | Discharge status: Home / Self Care

## 2024-10-22 ENCOUNTER — Encounter (HOSPITAL_BASED_OUTPATIENT_CLINIC_OR_DEPARTMENT_OTHER): Payer: Self-pay | Admitting: Emergency Medicine

## 2024-10-22 ENCOUNTER — Emergency Department (HOSPITAL_BASED_OUTPATIENT_CLINIC_OR_DEPARTMENT_OTHER)

## 2024-10-22 DIAGNOSIS — I10 Essential (primary) hypertension: Secondary | ICD-10-CM | POA: Insufficient documentation

## 2024-10-22 DIAGNOSIS — Z7982 Long term (current) use of aspirin: Secondary | ICD-10-CM | POA: Insufficient documentation

## 2024-10-22 DIAGNOSIS — Z79899 Other long term (current) drug therapy: Secondary | ICD-10-CM | POA: Diagnosis not present

## 2024-10-22 DIAGNOSIS — E119 Type 2 diabetes mellitus without complications: Secondary | ICD-10-CM | POA: Diagnosis not present

## 2024-10-22 DIAGNOSIS — Z8673 Personal history of transient ischemic attack (TIA), and cerebral infarction without residual deficits: Secondary | ICD-10-CM | POA: Insufficient documentation

## 2024-10-22 DIAGNOSIS — G43909 Migraine, unspecified, not intractable, without status migrainosus: Secondary | ICD-10-CM | POA: Insufficient documentation

## 2024-10-22 DIAGNOSIS — Z794 Long term (current) use of insulin: Secondary | ICD-10-CM | POA: Diagnosis not present

## 2024-10-22 DIAGNOSIS — I63521 Cerebral infarction due to unspecified occlusion or stenosis of right anterior cerebral artery: Secondary | ICD-10-CM | POA: Diagnosis not present

## 2024-10-22 DIAGNOSIS — R519 Headache, unspecified: Secondary | ICD-10-CM | POA: Diagnosis not present

## 2024-10-22 LAB — BASIC METABOLIC PANEL WITH GFR
Anion gap: 12 (ref 5–15)
BUN: 17 mg/dL (ref 8–23)
CO2: 26 mmol/L (ref 22–32)
Calcium: 8.7 mg/dL — ABNORMAL LOW (ref 8.9–10.3)
Chloride: 102 mmol/L (ref 98–111)
Creatinine, Ser: 0.62 mg/dL (ref 0.44–1.00)
GFR, Estimated: >60 mL/min (ref >60)
Glucose, Bld: 114 mg/dL — ABNORMAL HIGH (ref 70–99)
Potassium: 3.5 mmol/L (ref 3.5–5.1)
Sodium: 140 mmol/L (ref 135–145)

## 2024-10-22 LAB — CBC WITH DIFFERENTIAL/PLATELET
Abs Immature Granulocytes: 0.01 K/uL (ref 0.00–0.07)
Basophils Absolute: 0 K/uL (ref 0.0–0.1)
Basophils Relative: 1 %
Eosinophils Absolute: 0.1 K/uL (ref 0.0–0.5)
Eosinophils Relative: 3 %
HCT: 35.2 % — ABNORMAL LOW (ref 36.0–46.0)
Hemoglobin: 11.9 g/dL — ABNORMAL LOW (ref 12.0–15.0)
Immature Granulocytes: 0 %
Lymphocytes Relative: 43 %
Lymphs Abs: 1.8 K/uL (ref 0.7–4.0)
MCH: 30.4 pg (ref 26.0–34.0)
MCHC: 33.8 g/dL (ref 30.0–36.0)
MCV: 90 fL (ref 80.0–100.0)
Monocytes Absolute: 0.4 K/uL (ref 0.1–1.0)
Monocytes Relative: 10 %
Neutro Abs: 1.8 K/uL (ref 1.7–7.7)
Neutrophils Relative %: 43 %
Platelets: 200 K/uL (ref 150–400)
RBC: 3.91 MIL/uL (ref 3.87–5.11)
RDW: 13 % (ref 11.5–15.5)
WBC: 4.2 K/uL (ref 4.0–10.5)
nRBC: 0 % (ref 0.0–0.2)

## 2024-10-22 MED ORDER — SODIUM CHLORIDE 0.9 % IV BOLUS
1000.0000 mL | Freq: Once | INTRAVENOUS | Status: AC
  Administered 2024-10-22: 1000 mL via INTRAVENOUS

## 2024-10-22 MED ORDER — PROCHLORPERAZINE EDISYLATE 10 MG/2ML IJ SOLN
10.0000 mg | Freq: Once | INTRAMUSCULAR | Status: AC
Start: 2024-10-22 — End: 2024-10-22
  Administered 2024-10-22: 10 mg via INTRAVENOUS
  Filled 2024-10-22: qty 2

## 2024-10-22 MED ORDER — KETOROLAC TROMETHAMINE 15 MG/ML IJ SOLN
15.0000 mg | Freq: Once | INTRAMUSCULAR | Status: AC
  Administered 2024-10-22: 15 mg via INTRAVENOUS
  Filled 2024-10-22: qty 1

## 2024-10-22 MED ORDER — DIPHENHYDRAMINE HCL 50 MG/ML IJ SOLN
12.5000 mg | Freq: Once | INTRAMUSCULAR | Status: AC
  Administered 2024-10-22: 12.5 mg via INTRAVENOUS
  Filled 2024-10-22: qty 1

## 2024-10-22 NOTE — ED Triage Notes (Signed)
 Pt c/o migraine x 4d; sts she has taken Tramadol  w/o relief; sts she has had migraine almost every other day x 1 yr; she has a neurology appt in Dec

## 2024-10-22 NOTE — Discharge Instructions (Signed)
 Your blood work and CT scan is reassuring.  No concerning findings.  Your headache is significantly improved after the medicines.  Please follow-up with your primary care doctor and keep the appointment with your neurologist.  Return if you have any emergent symptoms.

## 2024-10-22 NOTE — ED Provider Notes (Signed)
 [place] [facility] AT [facility] Provider Note   CSN: [id number] Arrival date & time: 10/22/24  1409     Patient presents with: Migraine   Christine [name] is a 73 y.o. female.   73 year old female with past medical history significant for migraine headaches presents today for concern of headache that has been going on now for the past 4 days that has progressively worsened over this timeframe.  Not the worst headache of her life.  Came in today because she states it has not been relieved with her home regimen.  No vision change.  But does endorse sensitivity to light, sound, as well as smells.  This is typical for her migraine headaches.  She states she has had a couple MRIs this year and was found to have a pituitary adenoma.  She states her neurologist as well as her ENT told her it was nothing to be concerned about.  Her husband accompanies her at bedside.  The history is provided by the patient. No language interpreter was used.       Prior to Admission medications   Medication Sig Start Date End Date Taking? Authorizing Provider  acetaminophen  (TYLENOL ) 500 MG tablet Take 500 mg by mouth as needed.    [provider]  aspirin  81 MG tablet Take 81 mg by mouth daily.    [provider]  atorvastatin  (LIPITOR) 40 MG tablet Take 1 tablet (40 mg total) by mouth daily. 06/30/24   [name], MD  benzonatate  (TESSALON  PERLES) 100 MG capsule Take 1 capsule (100 mg total) by mouth 3 (three) times daily as needed. 10/02/24   [name], MD  cephALEXin  (KEFLEX ) 250 MG capsule TAKE 1 CAPSULE BY MOUTH EVERY DAY 09/17/24   Webb, Padonda B, FNP  DULoxetine  (CYMBALTA ) 30 MG capsule Take 30 mg by mouth daily.    [provider]  DULoxetine  (CYMBALTA ) 60 MG capsule Take 60 mg by mouth daily. Taking 90 mg daily    [provider]  fluticasone  (FLONASE ) 50 MCG/ACT nasal spray Nasal; Duration: 90 Days    [provider]   guaiFENesin-codeine 100-10 MG/5ML syrup Take 5 mLs by mouth every 6 (six) hours as needed for cough. 10/02/24   [name], MD  Inclisiran Sodium  (LEQVIO  [place]) Inject into the skin.    [provider]  Insulin  Pen Needle (PEN NEEDLES) 32G X 4 MM MISC 1 each by Does not apply route once a week. 10/02/24   [name], MD  Lancets [facility] ULTRASOFT) lancets Check blood sugar once daily. Dx code: 250.00 06/30/24   [name], MD  lidocaine  (LIDODERM ) 5 % Place 1 patch onto the skin daily. Remove & Discard patch within 12 hours or as directed by MD 09/06/24   Lenor Hollering, MD  LORazepam  (ATIVAN ) 0.5 MG tablet Take 0.5 mg by mouth as needed for anxiety.    [provider]  metoprolol  succinate (TOPROL -XL) 25 MG 24 hr tablet Take 0.5 tablets (12.5 mg total) by mouth daily. 06/30/24   [name], MD  omeprazole  (PRILOSEC) 20 MG capsule TAKE 1 CAPSULE BY MOUTH EVERY DAY 06/30/24   [name], MD  [facility] ULTRA TEST test strip USE 1 IN THE MORNING AND AT BEDTIME. USE AS INSTRUCTED 10/09/24   [name], MD  potassium chloride  (KLOR-CON  M) 10 MEQ tablet TAKE 1 TABLET BY MOUTH EVERY DAY 07/26/24   [name], MD  Semaglutide , 2 MG/DOSE, (  OZEMPIC , 2 MG/DOSE,) 8 MG/3ML SOPN Inject 2 mg into the skin once a week. Gets from pt assist 02/09/24   [name], MD  tiZANidine  (ZANAFLEX ) 4 MG tablet TAKE 1 TABLET BY MOUTH AT BEDTIME. Patient taking differently: Take 4 mg by mouth as needed for muscle spasms. 06/18/24   Claudene Arthea HERO, DO  traMADol  (ULTRAM ) 50 MG tablet Take 1 tablet (50 mg total) by mouth every 6 (six) hours as needed. 09/06/24   Lenor Hollering, MD  traZODone  (DESYREL ) 50 MG tablet TAKE 1/2 TO 1 TABLET BY MOUTH AT BEDTIME AS NEEDED FOR SLEEP 09/09/23   Webb, Padonda B, FNP  vitamin B-12 (CYANOCOBALAMIN ) 1000 MCG tablet Take 1,000 mcg by mouth daily.    [provider]  Vitamin D , Ergocalciferol , (DRISDOL ) 1.25 MG (50000 UNIT) CAPS  capsule TAKE 1 CAPSULE BY MOUTH ONE TIME PER WEEK 07/04/24   Claudene Arthea HERO, DO    Allergies: Vioxx [rofecoxib], Prednisone , Colesevelam hcl, Ezetimibe , Macrodantin  [nitrofurantoin  macrocrystal], Nitrofurantoin , Sulfa  antibiotics, and Colesevelam    Review of Systems  Constitutional:  Negative for chills and fever.  Eyes:  Positive for photophobia. Negative for visual disturbance.  Respiratory:  Negative for shortness of breath.   Neurological:  Positive for headaches. Negative for dizziness and light-headedness.  All other systems reviewed and are negative.   Updated Vital Signs BP (!) 179/98 (BP Location: Right Arm) Comment: pt states she didnt take her BP med today  Pulse 73   Temp 97.7 F (36.5 C) (Oral)   Resp 16   Ht 5' 5 (1.651 m)   Wt 60.3 kg   LMP  (LMP Unknown)   SpO2 99%   BMI 22.13 kg/m   Physical Exam Vitals and nursing note reviewed.  Constitutional:      General: She is not in acute distress.    Appearance: Normal appearance. She is not ill-appearing.  HENT:     Head: Normocephalic and atraumatic.     Nose: Nose normal.  Eyes:     Conjunctiva/sclera: Conjunctivae normal.  Cardiovascular:     Rate and Rhythm: Normal rate and regular rhythm.  Pulmonary:     Effort: Pulmonary effort is normal. No respiratory distress.  Musculoskeletal:        General: No deformity. Normal range of motion.     Cervical back: Normal range of motion.  Skin:    Findings: No rash.  Neurological:     Mental Status: She is alert.     Comments: Cranial nerves III through XII intact.  Good range of motion in bilateral upper and lower extremities.  No pronator drift.  No facial droop.  Normal speech.  Sensation intact.     (all labs ordered are listed, but only abnormal results are displayed) Labs Reviewed  CBC WITH DIFFERENTIAL/PLATELET  BASIC METABOLIC PANEL WITH GFR    EKG: None  Radiology: No results found.   Procedures   Medications Ordered in the ED   prochlorperazine (COMPAZINE) injection 10 mg (has no administration in time range)  ketorolac  (TORADOL ) 15 MG/ML injection 15 mg (15 mg Intravenous Given 10/22/24 1516)  sodium chloride  0.9 % bolus 1,000 mL (1,000 mLs Intravenous New Bag/Given 10/22/24 1515)  diphenhydrAMINE  (BENADRYL ) injection 12.5 mg (12.5 mg Intravenous Given 10/22/24 1517)                                    Medical Decision Making Amount  and/or Complexity of Data Reviewed Labs: ordered. Radiology: ordered.  Risk Prescription drug management.   Medical Decision Making / ED Course   This patient presents to the ED for concern of headache, this involves an extensive number of treatment options, and is a complaint that carries with it a high risk of complications and morbidity.  The differential diagnosis includes migraine headache, tension headache, cluster headache, intracranial mass, intracranial bleed  MDM: 73 year old female presents with above-mentioned complaints.  She has history of migraine headaches.  This does feel somewhat similar but was longer than her usual headaches. Will give migraine cocktail. Will obtain CT head. Will obtain blood work.  Headache almost resolved after the migraine cocktail. CBC BMP without acute concerns. CT head without acute intracranial process.  Patient is stable for discharge.  Feels comfortable with plan.  She will follow-up with her PCP and neurologist. Return precautions discussed.  Likely her typical migraine headache since it responded well to migraine cocktail.  Discharged in stable condition.    Additional history obtained: -Additional history obtained from chart review -External records from outside source obtained and reviewed including: Chart review including previous notes, labs, imaging, consultation notes   Lab Tests: -I ordered, reviewed, and interpreted labs.   The pertinent results include:   Labs Reviewed  CBC WITH DIFFERENTIAL/PLATELET   BASIC METABOLIC PANEL WITH GFR      EKG  EKG Interpretation Date/Time:    Ventricular Rate:    PR Interval:    QRS Duration:    QT Interval:    QTC Calculation:   R Axis:      Text Interpretation:           Imaging Studies ordered: I ordered imaging studies including CT head I independently visualized and interpreted imaging. I agree with the radiologist interpretation   Medicines ordered and prescription drug management: Meds ordered this encounter  Medications   ketorolac  (TORADOL ) 15 MG/ML injection 15 mg   sodium chloride  0.9 % bolus 1,000 mL   prochlorperazine (COMPAZINE) injection 10 mg   diphenhydrAMINE  (BENADRYL ) injection 12.5 mg    -I have reviewed the patients home medicines and have made adjustments as needed   Reevaluation: After the interventions noted above, I reevaluated the patient and found that they have :improved  Co morbidities that complicate the patient evaluation  Past Medical History:  Diagnosis Date   Acute bursitis of right shoulder 08/29/2018   Right shoulder injected in August 29, 2018   Allergic rhinitis    Closed displaced fracture of fifth metatarsal bone of left foot 03/16/2017   DEPRESSIVE DISORDER NOT ELSEWHERE CLASSIFIED 03/27/2010   Qualifier: Diagnosis of  By: [name], [name]   [name], NP , [facility] [place]     Diabetes (HCC) 04/02/2016   Diabetes mellitus    Essential hypertension 03/14/2008   Qualifier: Diagnosis of  By: [name], [name]     Fibromyalgia 08/12/2011   Diagnosed by [name] , Rheumatologist 2010    GERD (gastroesophageal reflux disease) 12/25/2016   Gluteal tendonitis of right buttock 10/19/2017   Injected in October 19, 2017 Injected October 07, 2018   Greater trochanteric bursitis of left hip 06/17/2016   Greater trochanteric bursitis of right hip 11/27/2015   Injected 11/27/2015    History of recurrent UTIs    [name]   HTN (hypertension)    Hx of osteopenia  10/11/2012   [facility] DEXA; ordered by [name] Lowest T score: -2.1 @  lumbar spine @ [facility] 11/01/12; decrease of 8.5% vs 05/2009. There is 9.2% risk over 10 yrs History fracture left elbow @ 4 Fractured left wrist , right elbow and nose post fall July/18/2013. Dr. [name] No FH Osteoporosis No PMH of bisphosphonate therapy (generic Fosamax  Rxed but not filled  due to polypharmacy )    Hyperlipidemia    Hyperlipidemia associated with type 2 diabetes mellitus (HCC) 01/18/2007   Qualifier: Diagnosis of  By: [name]  With DM LDL goal = < 100, ideally < 70. Mi in father @ 23; 2 brothers @ 48 & 23     Lumbar radiculopathy 04/11/2008   Qualifier: Diagnosis of  By: [name], [name] Dock well to epidural 01/12/2017  repeat epidural given May 2018   Migraine headache    quiescent   Nonallopathic lesion of cervical region 07/19/2014   Nonallopathic lesion of lumbosacral region 01/15/2016   Nonallopathic lesion of sacral region 06/27/2018   Nonallopathic lesion-rib cage 09/26/2014   Palpitations 04/15/2011   Rotator cuff impingement syndrome of left shoulder 05/11/2014   Seasonal allergic rhinitis 03/21/2012   Sinusitis, chronic 04/20/2016   Sleep disorder    Dr Dohmier   Subluxation of peroneal tendon of right foot 11/10/2017   TIA (transient ischemic attack)    TRANSIENT ISCHEMIC ATTACKS, HX OF 02/02/2008   Qualifier: Diagnosis of  By: Claudene GUSS Heron SERITA ADVRS EFF UNS RX MEDICINAL&BIOLOGICAL SBSTNC 02/02/2008   Qualifier: Diagnosis of  By: [name], [name] [name] AND MYOSITIS 02/02/2008   Qualifier: Diagnosis of  By: [name], [name]     UTI (urinary tract infection) 10/05/2014   Viral upper respiratory tract infection with cough 12/31/2014   Vitamin D  deficiency 05/25/2008   Qualifier: Diagnosis of  By: [name], [name]        Dispostion: Discharged in stable condition.  Return precaution discussed.  Patient voices understanding and is in agreement with  plan.  Final diagnoses:  Migraine without status migrainosus, not intractable, unspecified migraine type    ED Discharge Orders     None          Hildegard Loge, PA-C 10/22/24 1755    Neysa Caron PARAS, DO 10/25/24 2035

## 2024-10-23 ENCOUNTER — Other Ambulatory Visit: Payer: Self-pay | Admitting: Family Medicine

## 2024-10-26 DIAGNOSIS — E1165 Type 2 diabetes mellitus with hyperglycemia: Secondary | ICD-10-CM | POA: Diagnosis not present

## 2024-10-26 DIAGNOSIS — Z23 Encounter for immunization: Secondary | ICD-10-CM | POA: Diagnosis not present

## 2024-10-26 DIAGNOSIS — D352 Benign neoplasm of pituitary gland: Secondary | ICD-10-CM | POA: Diagnosis not present

## 2024-10-26 DIAGNOSIS — I10 Essential (primary) hypertension: Secondary | ICD-10-CM | POA: Diagnosis not present

## 2024-10-26 DIAGNOSIS — E7800 Pure hypercholesterolemia, unspecified: Secondary | ICD-10-CM | POA: Diagnosis not present

## 2024-10-27 DIAGNOSIS — F410 Panic disorder [episodic paroxysmal anxiety] without agoraphobia: Secondary | ICD-10-CM | POA: Diagnosis not present

## 2024-10-30 ENCOUNTER — Ambulatory Visit: Admitting: Family Medicine

## 2024-10-30 ENCOUNTER — Encounter: Payer: Self-pay | Admitting: Family Medicine

## 2024-10-30 ENCOUNTER — Other Ambulatory Visit: Payer: Self-pay | Admitting: Family Medicine

## 2024-10-30 ENCOUNTER — Other Ambulatory Visit

## 2024-10-30 VITALS — BP 130/78 | HR 74 | Temp 97.2°F | Ht 65.0 in | Wt 133.1 lb

## 2024-10-30 DIAGNOSIS — L989 Disorder of the skin and subcutaneous tissue, unspecified: Secondary | ICD-10-CM

## 2024-10-30 DIAGNOSIS — Z794 Long term (current) use of insulin: Secondary | ICD-10-CM | POA: Diagnosis not present

## 2024-10-30 DIAGNOSIS — E1165 Type 2 diabetes mellitus with hyperglycemia: Secondary | ICD-10-CM | POA: Diagnosis not present

## 2024-10-30 MED ORDER — INSULIN GLARGINE 100 UNIT/ML SOLOSTAR PEN: 10.0000 [IU] | PEN_INJECTOR | Freq: Every day | SUBCUTANEOUS | 3 refills | Status: DC

## 2024-10-30 MED ORDER — PROMETHAZINE HCL 25 MG PO TABS: 25.0000 mg | ORAL_TABLET | Freq: Three times a day (TID) | ORAL | 2 refills | Status: AC | PRN

## 2024-10-30 NOTE — Progress Notes (Signed)
 Subjective:     Patient ID: Christine [name], female    DOB: 06/25/1951, 73 y.o.   MRN: [id number]  Chief Complaint  Patient presents with   Diabetes    Pt is here for follow up on Diabetes     Discussed the use of AI scribe software for clinical note transcription with the patient, who gave verbal consent to proceed.  History of Present Illness Christine [name] is a 73 year old female with diabetes who presents with concerns about blood sugar control and medication management.  She is experiencing fluctuating blood sugar levels, with morning readings ranging from 125 to 167 mg/dL and daytime readings reaching up to 180 mg/dL. She has not recorded any readings over 200 mg/dL but suspects she may have reached that level. She is confused about her current diabetes management, particularly regarding the use of Ozempic  due to concerns about cost and efficacy. She is exploring options for patient assistance and considering changes to her Part D drug plan to manage costs better.  She recently visited her endocrinologist, who briefly addressed her pituitary condition. She reports a recent increase in her prolactin levels, measured at 56.9 ng/mL, up from 39 ng/mL previously. She has not been started on medication for this, and there is no nipple discharge, although she experiences occasional stinging sensations.  She has a history of migraines and recently experienced a four-day migraine that led her to the emergency room, where she received a 'migraine cocktail' including Toradol  and Compazine. This was the first time she received medication for a migraine. She is scheduled to see a neurologist, Dr. Skeet, on December 1st, who may consider new migraine treatments.  She is concerned about a sore spot on her scalp that has been present for four days, which stings and burns. She is unsure if it is related to shingles, as she has not had a shingles shot yet. She plans to monitor the area for  changes.  Her current medications include Ozempic , and she has previously been on insulin , which was tapered off. She did drop her ozempic  to 1.5mg  but then got the HA and sugars increased.  She and her husband Prentice have been unwell recently, leading to less attention to diet and more reliance on convenience foods.    Health Maintenance Due  Topic Date Due   Zoster Vaccines- Shingrix (1 of 2) Never done   DTaP/Tdap/Td (2 - Tdap) 06/08/2019   OPHTHALMOLOGY EXAM  07/15/2024   Medicare Annual Wellness (AWV)  09/01/2024    Past Medical History:  Diagnosis Date   Acute bursitis of right shoulder 08/29/2018   Right shoulder injected in August 29, 2018   Allergic rhinitis    Closed displaced fracture of fifth metatarsal bone of left foot 03/16/2017   DEPRESSIVE DISORDER NOT ELSEWHERE CLASSIFIED 03/27/2010   Qualifier: Diagnosis of  By: [name], [name]   [name], NP , [facility] [place] [place]     Diabetes (HCC) 04/02/2016   Diabetes mellitus    Essential hypertension 03/14/2008   Qualifier: Diagnosis of  By: [name], [name]     Fibromyalgia 08/12/2011   Diagnosed by [name] , Rheumatologist 2010    GERD (gastroesophageal reflux disease) 12/25/2016   Gluteal tendonitis of right buttock 10/19/2017   Injected in October 19, 2017 Injected October 07, 2018   Greater trochanteric bursitis of left hip 06/17/2016   Greater trochanteric bursitis of right hip 11/27/2015   Injected 11/27/2015  History of recurrent UTIs    [name]   HTN (hypertension)    Hx of osteopenia 10/11/2012   [facility] DEXA; ordered by [name] Lowest T score: -2.1 @ lumbar spine @ [facility] 11/01/12; decrease of 8.5% vs 05/2009. There is 9.2% risk over 10 yrs History fracture left elbow @ 4 Fractured left wrist , right elbow and nose post fall July/18/2013. Dr. [name] No FH Osteoporosis No PMH of bisphosphonate therapy (generic Fosamax  Rxed but not filled  due to polypharmacy )    Hyperlipidemia     Hyperlipidemia associated with type 2 diabetes mellitus (HCC) 01/18/2007   Qualifier: Diagnosis of  By: [name]  With DM LDL goal = < 100, ideally < 70. Mi in father @ 65; 2 brothers @ 67 & 29     Lumbar radiculopathy 04/11/2008   Qualifier: Diagnosis of  By: [name], [name] Dock well to epidural 01/12/2017  repeat epidural given May 2018   Migraine headache    quiescent   Nonallopathic lesion of cervical region 07/19/2014   Nonallopathic lesion of lumbosacral region 01/15/2016   Nonallopathic lesion of sacral region 06/27/2018   Nonallopathic lesion-rib cage 09/26/2014   Palpitations 04/15/2011   Rotator cuff impingement syndrome of left shoulder 05/11/2014   Seasonal allergic rhinitis 03/21/2012   Sinusitis, chronic 04/20/2016   Sleep disorder    Dr Dohmier   Subluxation of peroneal tendon of right foot 11/10/2017   TIA (transient ischemic attack)    TRANSIENT ISCHEMIC ATTACKS, HX OF 02/02/2008   Qualifier: Diagnosis of  By: Claudene GUSS Heron SERITA ADVRS EFF UNS RX MEDICINAL&BIOLOGICAL SBSTNC 02/02/2008   Qualifier: Diagnosis of  By: [name], [name] [name] AND MYOSITIS 02/02/2008   Qualifier: Diagnosis of  By: [name], [name]     UTI (urinary tract infection) 10/05/2014   Viral upper respiratory tract infection with cough 12/31/2014   Vitamin D  deficiency 05/25/2008   Qualifier: Diagnosis of  By: [name], [name]      Past Surgical History:  Procedure Laterality Date   COLONOSCOPY     Dr Avram; hemorrhoids   CORONARY ANGIOPLASTY  12/15/1995   for chest pain- negative    FOOT MASS EXCISION Left 04/08/2023   KNEE ARTHROSCOPY Left 2023   POLYPECTOMY  12/14/2000   benign, hyperplastic polyp; rectal bleeding 2008   TONSILLECTOMY     TOTAL ABDOMINAL HYSTERECTOMY  12/14/1981   BSO for endometriosis   WISDOM TOOTH EXTRACTION       Current Outpatient Medications:    acetaminophen  (TYLENOL ) 500 MG tablet, Take 500 mg by mouth as needed., Disp: , Rfl:     aspirin  81 MG tablet, Take 81 mg by mouth daily., Disp: , Rfl:    atorvastatin  (LIPITOR) 40 MG tablet, Take 1 tablet (40 mg total) by mouth daily., Disp: 90 tablet, Rfl: 3   cephALEXin  (KEFLEX ) 250 MG capsule, TAKE 1 CAPSULE BY MOUTH EVERY DAY, Disp: 90 capsule, Rfl: 1   DULoxetine  (CYMBALTA ) 30 MG capsule, Take 30 mg by mouth daily., Disp: , Rfl:    DULoxetine  (CYMBALTA ) 60 MG capsule, Take 60 mg by mouth daily. Taking 90 mg daily, Disp: , Rfl:    guaiFENesin-codeine 100-10 MG/5ML syrup, Take 5 mLs by mouth every 6 (six) hours as needed for cough., Disp: 118 mL, Rfl: 0   Inclisiran Sodium  (LEQVIO  Kirwin), Inject into the skin., Disp: , Rfl:    insulin  glargine (LANTUS ) 100 UNIT/ML Solostar  Pen, Inject 10 Units into the skin daily., Disp: 15 mL, Rfl: 3   Insulin  Pen Needle (PEN NEEDLES) 32G X 4 MM MISC, 1 each by Does not apply route once a week., Disp: 30 each, Rfl: 1   Lancets (ONETOUCH ULTRASOFT) lancets, Check blood sugar once daily. Dx code: 250.00, Disp: 100 each, Rfl: 12   LORazepam  (ATIVAN ) 0.5 MG tablet, Take 0.5 mg by mouth as needed for anxiety., Disp: , Rfl:    metoprolol  succinate (TOPROL -XL) 25 MG 24 hr tablet, Take 0.5 tablets (12.5 mg total) by mouth daily., Disp: 45 tablet, Rfl: 1   omeprazole  (PRILOSEC) 20 MG capsule, TAKE 1 CAPSULE BY MOUTH EVERY DAY, Disp: 90 capsule, Rfl: 3   ONETOUCH ULTRA TEST test strip, USE 1 IN THE MORNING AND AT BEDTIME. USE AS INSTRUCTED, Disp: 200 strip, Rfl: 3   potassium chloride  (KLOR-CON  M) 10 MEQ tablet, TAKE 1 TABLET BY MOUTH EVERY DAY, Disp: 90 tablet, Rfl: 1   promethazine  (PHENERGAN ) 25 MG tablet, Take 1 tablet (25 mg total) by mouth every 8 (eight) hours as needed for nausea or vomiting., Disp: 20 tablet, Rfl: 2   Semaglutide , 2 MG/DOSE, (OZEMPIC , 2 MG/DOSE,) 8 MG/3ML SOPN, Inject 2 mg into the skin once a week. Gets from pt assist, Disp: 2 mL, Rfl: 0   tiZANidine  (ZANAFLEX ) 4 MG tablet, TAKE 1 TABLET BY MOUTH AT BEDTIME., Disp: 90 tablet, Rfl:  1   traMADol  (ULTRAM ) 50 MG tablet, Take 1 tablet (50 mg total) by mouth every 6 (six) hours as needed., Disp: 10 tablet, Rfl: 0   traZODone  (DESYREL ) 50 MG tablet, TAKE 1/2 TO 1 TABLET BY MOUTH AT BEDTIME AS NEEDED FOR SLEEP, Disp: 90 tablet, Rfl: 1   vitamin B-12 (CYANOCOBALAMIN ) 1000 MCG tablet, Take 1,000 mcg by mouth daily., Disp: , Rfl:    Vitamin D , Ergocalciferol , (DRISDOL ) 1.25 MG (50000 UNIT) CAPS capsule, TAKE 1 CAPSULE BY MOUTH ONE TIME PER WEEK, Disp: 12 capsule, Rfl: 0   zoledronic acid (RECLAST) 5 MG/100ML SOLN injection, as directed Intravenous, Disp: , Rfl:   Allergies  Allergen Reactions   Vioxx [Rofecoxib] Other (See Comments)    Excess BP which caused TIA    Prednisone  Other (See Comments)    Mental status changes with high-dose oral agent. Tolerates epidural steroid injections.  No associated rash or fever   Colesevelam Hcl Other (See Comments)   Ezetimibe  Other (See Comments)    myalgias   Macrodantin  [Nitrofurantoin  Macrocrystal] Hives   Nitrofurantoin  Other (See Comments)   Sulfa  Antibiotics Other (See Comments)    Hives   Colesevelam Other (See Comments)    Leg Pain   ROS neg/noncontributory except as noted HPI/below      Objective:     BP 130/78 (BP Location: Left Arm, Patient Position: Sitting, Cuff Size: Normal)   Pulse 74   Temp (!) 97.2 F (36.2 C) (Temporal)   Ht 5' 5 (1.651 m)   Wt 133 lb 2 oz (60.4 kg)   LMP  (LMP Unknown)   BMI 22.15 kg/m  Wt Readings from Last 3 Encounters:  10/30/24 133 lb 2 oz (60.4 kg)  10/22/24 133 lb (60.3 kg)  10/11/24 133 lb (60.3 kg)    Physical Exam VITALS: BP- 190/ GENERAL: Well developed, well nourished, no acute distress. HEAD EYES EARS NOSE THROAT: Normocephalic, atraumatic, conjunctiva not injected, sclera nonicteric. EXTREMITIES: No edema. MUSCULOSKELETAL: No gross abnormalities. NEUROLOGICAL: Alert and oriented x3, cranial nerves II through XII intact. PSYCHIATRIC: Normal mood,  good eye  contact. SKIN: Small red papular lesion on scalp with hair in the middle, non-raised.       Assessment & Plan:  Type 2 diabetes mellitus with hyperglycemia, without long-term current use of insulin  (HCC) -     Insulin  Glargine; Inject 10 Units into the skin daily.  Dispense: 15 mL; Refill: 3 -     Hemoglobin A1c  Skin lesion  Other orders -     Promethazine  HCl; Take 1 tablet (25 mg total) by mouth every 8 (eight) hours as needed for nausea or vomiting.  Dispense: 20 tablet; Refill: 2    Assessment and Plan Assessment & Plan Type 2 diabetes mellitus with hyperglycemia and GAD antibody positivity (autoimmune diabetes)   Blood glucose levels range from 125 to 180 mg/dL,. GAD antibody positivity indicates autoimmune diabetes. Ozempic  is beneficial for kidney and heart protection, but cost is a concern. Insulin  therapy is considered due to persistent hyperglycemia and potential autoimmune progression. Discussed hypoglycemia risks with insulin  and emphasized balancing glycemic control with hypoglycemia risk, especially given her age and history of dizziness. Prescribed long-acting insulin  starting at 3 units daily, with gradual increase as needed. Instructed to monitor blood glucose closely and carry snacks like orange juice or peanut butter crackers for hypoglycemia. Advised to contact insurance about Ozempic  cost and consider patient assistance if necessary. Discussed potential change in Part D provider if Ozempic  cost remains high.  Migraine   A recent four-day migraine episode was treated with Toradol  and Compazine in the ER. Triptans were effective previously but are contraindicated due to TIA history. She has an upcoming appointment with a neurologist for potential new migraine medication. Continue follow-up with the neurologist on December 1st for migraine management.  Scalp lesion, possible folliculitis versus shingles   A scalp lesion has been present for four days, described as sore  and stinging. Differential includes folliculitis and shingles. The decision is to monitor for changes before initiating treatment. Monitor the scalp lesion for changes or additional lesions and maintain cleanliness of the area. Consider shingles treatment if additional lesions develop or symptoms worsen.  General Health Maintenance   Discussion of shingles vaccination is deferred due to the current scalp lesion. Recent flu vaccination is completed. Consider shingles vaccination at the pharmacy if the scalp lesion resolves and no new lesions develop.     Return for assch.  Jenkins CHRISTELLA Carrel, MD

## 2024-10-30 NOTE — Patient Instructions (Addendum)
 Check insurance on the ozempic   Do the patient assistance  Change part D  Wait till next year and see what Trump says  Start on the insulin  3 units/day

## 2024-10-31 ENCOUNTER — Ambulatory Visit: Payer: Self-pay | Admitting: Family Medicine

## 2024-10-31 LAB — HEMOGLOBIN A1C
Hgb A1c MFr Bld: 7.1 % — ABNORMAL HIGH (ref <5.7)
Mean Plasma Glucose: 157 mg/dL
eAG (mmol/L): 8.7 mmol/L

## 2024-10-31 NOTE — Progress Notes (Signed)
 Pt has read results smk

## 2024-10-31 NOTE — Progress Notes (Signed)
 A1C fine.  Can still do baby doses of insulin  and monitor or wait for now

## 2024-11-12 NOTE — Progress Notes (Unsigned)
 NEUROLOGY CONSULTATION NOTE  Christine [name] MRN: [id number] DOB: [birth year]  Referring provider: Jenkins Earnie Carrel, MD Primary care provider: Jenkins Earnie Carrel, MD  Reason for consult:  headache  Assessment/Plan:   Migraine without aura, without status migrainosus, not intractable - query component of TMJ dysfunction as she does have right TMJ tenderness and notes clicking in the right ear.  Migraine prevention:  Start zonisamide 25mg  daily for one week, then increase to 50mg  daily.  If no improvement in 5 weeks, we can increase to 100mg  daily. Migraine rescue:  She will try samples of Ubrelvy 100mg .  Triptans contraindicated due to history of TIA Lifestyle modification: Stop acetaminophen .  Limit use of pain relievers to no more than 9 days out of the month to prevent risk of rebound or medication-overuse headache. Diet modification/hydration/caffeine cessation Routine exercise Sleep hygiene Consider follow up with dentist to see if mouth guard may be indicated Consider vitamins/supplements:  magnesium citrate 400mg  daily, riboflavin 400mg  daily, CoQ10 100mg  three times daily Keep headache diary Follow up 7 months.    Subjective:  Christine [name] is a 73 year old left-handed female with type 2 diabetes,depression and hypertension and history of TIA who presents for headaches.  History obtained by patient and internal medicine notes.  Discussed the use of AI scribe software for clinical note transcription with the patient, who gave verbal consent to proceed.  History of Present Illness Christine [name] is a 73 year old female with a history of migraines who presents with worsening headaches.  She has a history of migraines that were prevalent until her 29s or 77s. Around 2015 or 2016, she began experiencing a different type of headache characterized by right-sided periorbital pain radiating to the right temple and maxilla, with an aching quality and intensity of 7 out of 10.  These headaches were associated with nausea, right ptosis, photophobia, and tingling around the eye and maxilla, and bilateral tinnitus but no visual disturbances. They were gradual in onset, lasting for hours, sometimes waking her at night, and occurred about twice a week. Triggers included cold, and relief was found with a heating pad on the jaw. Initial workup in 2017 included a sed rate of 35, negative CRP, and an MRI showing chronic right frontal infarct and mild chronic microvascular ischemic changes, but no acute abnormalities. A CTA of the head and neck in July 2017 showed no posterior circulation, hemodynamically significant stenosis.  Since then, her habitual migraines occur every couple of month although sometimes they may last up to 4 days.  She notes clicking in her right ear.  Since March 2024, she reports a new headache different from her previous migraines. These new headaches present as severe pressure across the forehead and nose, occurring once or twice a month independently, but more frequently with migraines. They last a couple of hours and can occur three to four times a day. She experiences sensitivity to lights, sounds, and smells with these headaches, but less nausea compared to her migraines.  She also experiences occasional headaches in the back of her head, described as moderate aching soreness, not associated with light or sound sensitivity, occurring once or twice a month, often following migraines.  She has had repeat MRIs since then, which have not revealed any new or concerning findings.  She experiences dizziness, which she reports was diagnosed as orthostatic hypotension after significant weight loss and unchanged blood pressure medication. This dizziness is not associated with her headaches and occurs mainly upon standing  after sitting for a while.  Due to orthostatic hypotension, she had a fall in November 2023 which caused a concussion.    Initial workup in 2017  included: 12/25/2015 LABS:  Sed rate 35, CRP negative 01/23/2016 MRI BRAIN W WO:  Chronic right frontal infarct. Mild chronic microvascular ischemic change in the white matter 06/18/2016 CTA HEAD & NECK:  1. No posterior circulation hemodynamically significant stenosis identified. Mildly dominant left vertebral artery with calcified plaque in the V4 segment but only mild stenosis. Mild atherosclerosis and stenosis also in both PCA P1 segments. 2. Mild for age cervical carotid calcified plaque without stenosis. Bilateral ICA siphon calcified plaque without stenosis. 3. Otherwise negative anterior circulation. 4. Stable CT appearance of the brain. Small chronic anterior right MCA infarct. 5. No acute findings in the neck.  No acute intracranial abnormality.  Headaches occurred off and on since then, requiring repeat imaging. 02/13/2020 MRI BRAIN W WO:  1. No acute intracranial abnormality. 2. Small chronic right frontal lobe infarct, stable from previous. 3. Age-related cerebral atrophy with mild chronic microvascular ischemic disease, mildly progressed relative to most recent available MRI from 2017. 02/13/2020 MRI C-SPINE:  1. Multilevel cervical spondylosis with resultant mild diffuse spinal stenosis at C4-5 through C6-7. 2. Multifactorial degenerative changes with resultant moderate right with mild left C5 through C7 foraminal stenosis. Findings could contribute to right-sided radicular symptoms. 3. Mild reactive edema about the right C4-5 facet due to facet arthritis. Finding could contribute to underlying neck pain. 02/21/2020 CAROTID US :  minimal heterogeneous and calcified plaque, with no hemodynamically significant stenosis 07/23/2021 MRI BRAIN WO:  No evidence of acute intracranial abnormality. Stable non-contrast MRI appearance of the brain as compared to 02/13/2020. 10/12/2023 MRI BRAIN WO:  1. No acute intracranial abnormality.  2. Mild chronic small vessel ischemic disease. 3. Small chronic  right frontal infarct. 03/07/2024 MRI BRAIN W WO:  No recent insult or specific cause for symptoms. Chronic small vessel disease and small chronic right frontal cortex infarct. 5 mm right pituitary mass, likely adenoma. No mass effect on adjacent structures, correlate for pituitary hypersecretion. 03/07/2024 MRI C-SPINE W WO:  1. Degenerative cervical spondylosis with multilevel disc disease and facet disease. 2. Moderate to significant right foraminal stenosis at C4-5. 3. Moderate right foraminal stenosis at C5-6. 4. Mild right foraminal narrowing at C6-7. 5. Normal MR appearance of the cervical spinal cord. 10/22/2024 CT HEAD WO:  1. No acute intracranial abnormality. 2. Unchanged remote right frontal lobe infarct.  Past NSAIDS/analgesics:  tramadol  50mg , naproxen  Past abortive triptans:  none Past abortive ergotamine:  none Past muscle relaxants:  none Past anti-emetic:  none Past antihypertensive medications:  losartan  Past antidepressant medications:  nortriptyline  (briefly) Past anticonvulsant medications:  none Past anti-CGRP:  none Past vitamins/Herbal/Supplements:  none Past antihistamines/decongestants:  none Other past therapies:  none  Current NSAIDS/analgesics:  acetaminophen  (4 out of 7 days a week), ASA 81mg  daily Current triptans:  none Current ergotamine:  none Current anti-emetic:  promethazine  25mg  Current muscle relaxants:  tizanidine  4mg  at bedtime Current Antihypertensive medications:  metoprolol  succinate Current Antidepressant medications:  duloxetine  90mg  daily, trazodone  25-50mg  at bedtime (insomnia) Current Anticonvulsant medications:  none Current anti-CGRP:  none Current Vitamins/Herbal/Supplements:  none Current Antihistamines/Decongestants:  none Other therapy:  none Other medications:  Ativan  0.5mg  PRN   PAST MEDICAL HISTORY: Past Medical History:  Diagnosis Date   Acute bursitis of right shoulder 08/29/2018   Right shoulder injected in August 29, 2018   Allergic rhinitis  Closed displaced fracture of fifth metatarsal bone of left foot 03/16/2017   DEPRESSIVE DISORDER NOT ELSEWHERE CLASSIFIED 03/27/2010   Qualifier: Diagnosis of  By: [name], [name]   [name], NP , [facility] [place]     Diabetes (HCC) 04/02/2016   Diabetes mellitus    Essential hypertension 03/14/2008   Qualifier: Diagnosis of  By: [name], [name]     Fibromyalgia 08/12/2011   Diagnosed by [name] , Rheumatologist 2010    GERD (gastroesophageal reflux disease) 12/25/2016   Gluteal tendonitis of right buttock 10/19/2017   Injected in October 19, 2017 Injected October 07, 2018   Greater trochanteric bursitis of left hip 06/17/2016   Greater trochanteric bursitis of right hip 11/27/2015   Injected 11/27/2015    History of recurrent UTIs    [name]   HTN (hypertension)    Hx of osteopenia 10/11/2012   [facility] DEXA; ordered by [name] Lowest T score: -2.1 @ lumbar spine @ [facility] 11/01/12; decrease of 8.5% vs 05/2009. There is 9.2% risk over 10 yrs History fracture left elbow @ 4 Fractured left wrist , right elbow and nose post fall July/18/2013. Dr. [name] No FH Osteoporosis No PMH of bisphosphonate therapy (generic Fosamax  Rxed but not filled  due to polypharmacy )    Hyperlipidemia    Hyperlipidemia associated with type 2 diabetes mellitus (HCC) 01/18/2007   Qualifier: Diagnosis of  By: [name]  With DM LDL goal = < 100, ideally < 70. Mi in father @ 60; 2 brothers @ 87 & 78     Lumbar radiculopathy 04/11/2008   Qualifier: Diagnosis of  By: [name], [name] Dock well to epidural 01/12/2017  repeat epidural given May 2018   Migraine headache    quiescent   Nonallopathic lesion of cervical region 07/19/2014   Nonallopathic lesion of lumbosacral region 01/15/2016   Nonallopathic lesion of sacral region 06/27/2018   Nonallopathic lesion-rib cage 09/26/2014   Palpitations 04/15/2011   Rotator cuff impingement syndrome of left  shoulder 05/11/2014   Seasonal allergic rhinitis 03/21/2012   Sinusitis, chronic 04/20/2016   Sleep disorder    Dr Dohmier   Subluxation of peroneal tendon of right foot 11/10/2017   TIA (transient ischemic attack)    TRANSIENT ISCHEMIC ATTACKS, HX OF 02/02/2008   Qualifier: Diagnosis of  By: Claudene GUSS Heron SERITA ADVRS EFF UNS RX MEDICINAL&BIOLOGICAL SBSTNC 02/02/2008   Qualifier: Diagnosis of  By: [name], [name] [name] AND MYOSITIS 02/02/2008   Qualifier: Diagnosis of  By: [name], [name]     UTI (urinary tract infection) 10/05/2014   Viral upper respiratory tract infection with cough 12/31/2014   Vitamin D  deficiency 05/25/2008   Qualifier: Diagnosis of  By: [name], [name] KNIGHT SURGICAL HISTORY: Past Surgical History:  Procedure Laterality Date   COLONOSCOPY     Dr Avram; hemorrhoids   CORONARY ANGIOPLASTY  12/15/1995   for chest pain- negative    FOOT MASS EXCISION Left 04/08/2023   KNEE ARTHROSCOPY Left 2023   POLYPECTOMY  12/14/2000   benign, hyperplastic polyp; rectal bleeding 2008   TONSILLECTOMY     TOTAL ABDOMINAL HYSTERECTOMY  12/14/1981   BSO for endometriosis   WISDOM TOOTH EXTRACTION      MEDICATIONS: Current Outpatient Medications on File Prior to Visit  Medication Sig Dispense Refill   acetaminophen  (TYLENOL ) 500 MG tablet Take 500 mg by  mouth as needed.     aspirin  81 MG tablet Take 81 mg by mouth daily.     atorvastatin  (LIPITOR) 40 MG tablet Take 1 tablet (40 mg total) by mouth daily. 90 tablet 3   cephALEXin  (KEFLEX ) 250 MG capsule TAKE 1 CAPSULE BY MOUTH EVERY DAY 90 capsule 1   DULoxetine  (CYMBALTA ) 30 MG capsule Take 30 mg by mouth daily.     DULoxetine  (CYMBALTA ) 60 MG capsule Take 60 mg by mouth daily. Taking 90 mg daily     Insulin  Pen Needle (PEN NEEDLES) 32G X 4 MM MISC 1 each by Does not apply route once a week. 30 each 1   Lancets (ONETOUCH ULTRASOFT) lancets Check blood sugar once daily. Dx code: 250.00 100  each 12   LORazepam  (ATIVAN ) 0.5 MG tablet Take 0.5 mg by mouth as needed for anxiety.     metoprolol  succinate (TOPROL -XL) 25 MG 24 hr tablet Take 0.5 tablets (12.5 mg total) by mouth daily. 45 tablet 1   omeprazole  (PRILOSEC) 20 MG capsule TAKE 1 CAPSULE BY MOUTH EVERY DAY 90 capsule 3   ONETOUCH ULTRA TEST test strip USE 1 IN THE MORNING AND AT BEDTIME. USE AS INSTRUCTED 200 strip 3   promethazine  (PHENERGAN ) 25 MG tablet Take 1 tablet (25 mg total) by mouth every 8 (eight) hours as needed for nausea or vomiting. 20 tablet 2   Semaglutide , 2 MG/DOSE, (OZEMPIC , 2 MG/DOSE,) 8 MG/3ML SOPN Inject 2 mg into the skin once a week. Gets from pt assist 2 mL 0   tiZANidine  (ZANAFLEX ) 4 MG tablet TAKE 1 TABLET BY MOUTH AT BEDTIME. (Patient taking differently: Take 4 mg by mouth every 8 (eight) hours as needed.) 90 tablet 1   traMADol  (ULTRAM ) 50 MG tablet Take 1 tablet (50 mg total) by mouth every 6 (six) hours as needed. 10 tablet 0   traZODone  (DESYREL ) 50 MG tablet TAKE 1/2 TO 1 TABLET BY MOUTH AT BEDTIME AS NEEDED FOR SLEEP 90 tablet 1   vitamin B-12 (CYANOCOBALAMIN ) 1000 MCG tablet Take 1,000 mcg by mouth daily.     Vitamin D , Ergocalciferol , (DRISDOL ) 1.25 MG (50000 UNIT) CAPS capsule TAKE 1 CAPSULE BY MOUTH ONE TIME PER WEEK (Patient taking differently: Take 50,000 Units by mouth every 7 (seven) days.) 12 capsule 0   zoledronic  acid (RECLAST ) 5 MG/100ML SOLN injection as directed Intravenous     potassium chloride  (KLOR-CON  M) 10 MEQ tablet TAKE 1 TABLET BY MOUTH EVERY DAY (Patient not taking: Reported on 11/13/2024) 90 tablet 1   No current facility-administered medications on file prior to visit.     ALLERGIES: Allergies  Allergen Reactions   Vioxx [Rofecoxib] Other (See Comments)    Excess BP which caused TIA    Prednisone  Other (See Comments)    Mental status changes with high-dose oral agent. Tolerates epidural steroid injections.  No associated rash or fever   Colesevelam Hcl Other  (See Comments)   Ezetimibe  Other (See Comments)    myalgias   Macrodantin  [Nitrofurantoin  Macrocrystal] Hives   Nitrofurantoin  Other (See Comments)   Sulfa  Antibiotics Other (See Comments)    Hives   Colesevelam Other (See Comments)    Leg Pain    FAMILY HISTORY: Family History  Problem Relation Age of Onset   Colon cancer Maternal Aunt    Diabetes Paternal Uncle    Heart attack Father 45   COPD Mother    Lung cancer Brother        smoker  Mental illness Other        niece, committed suicide.   Heart attack Other        brother X 2; @ 50 & 52   Stroke Neg Hx     Objective:  Blood pressure 103/68, pulse 92, height 5' 5 (1.651 m), weight 133 lb (60.3 kg), SpO2 99%. General: No acute distress.  Patient appears well-groomed.   Head:  Normocephalic/atraumatic, right TMJ tenderness to palpation Eyes:  fundi examined but not visualized Neck: supple, no paraspinal tenderness, full range of motion Heart: regular rate and rhythm Neurological Exam: Mental status: alert and oriented to person, place, and time, speech fluent and not dysarthric, language intact. Cranial nerves: CN I: not tested CN II: pupils equal, round and reactive to light, visual fields intact CN III, IV, VI:  full range of motion, no nystagmus, no ptosis CN V: facial sensation intact. CN VII: upper and lower face symmetric CN VIII: hearing intact CN IX, X: gag intact, uvula midline CN XI: sternocleidomastoid and trapezius muscles intact CN XII: tongue midline Bulk & Tone: normal, no fasciculations. Motor:  muscle strength 5/5 throughout Sensation:  Pinprick and vibratory sensation intact. Deep Tendon Reflexes:  2+ throughout,  toes downgoing.   Finger to nose testing:  Without dysmetria.   Gait:  Normal station and stride.  Romberg negative.    Thank you for allowing me to take part in the care of this patient.  Juliene Dunnings, DO  CC: Jenkins Earnie Carrel, MD

## 2024-11-13 ENCOUNTER — Ambulatory Visit (INDEPENDENT_AMBULATORY_CARE_PROVIDER_SITE_OTHER): Admitting: Neurology

## 2024-11-13 ENCOUNTER — Encounter: Payer: Self-pay | Admitting: Neurology

## 2024-11-13 VITALS — BP 103/68 | HR 92 | Ht 65.0 in | Wt 133.0 lb

## 2024-11-13 DIAGNOSIS — G43009 Migraine without aura, not intractable, without status migrainosus: Secondary | ICD-10-CM | POA: Diagnosis not present

## 2024-11-13 MED ORDER — ZONISAMIDE 25 MG PO CAPS: 50.0000 mg | ORAL_CAPSULE | Freq: Every day | ORAL | 5 refills | Status: AC

## 2024-11-13 NOTE — Patient Instructions (Signed)
 Take zonisamide  25mg  daily for one week, then increase to 50mg  daily  Take ubrelvy  at earliest onset of migraine.  May repeat after 2 hours.  Maximum 2 tablets in 24 hours  Stop acetaminophen .  Limit use of pain relievers to no more than 9 days out of the month to prevent risk of rebound or medication-overuse headache.

## 2024-11-13 NOTE — Progress Notes (Signed)
 Medication Samples have been provided to the patient.  Drug name: holland       Strength: 100        Qty: 4  LOT: [id number]  Exp.Date: 6/27  Dosing instructions: as directed   The patient has been instructed regarding the correct time, dose, and frequency of taking this medication, including desired effects and most common side effects.

## 2024-11-15 ENCOUNTER — Encounter: Payer: Self-pay | Admitting: Neurology

## 2024-11-15 ENCOUNTER — Encounter: Payer: Self-pay | Admitting: Family Medicine

## 2024-11-15 ENCOUNTER — Other Ambulatory Visit: Payer: Self-pay | Admitting: Neurology

## 2024-11-15 MED ORDER — UBRELVY 100 MG PO TABS: 1.0000 | ORAL_TABLET | ORAL | 5 refills | Status: AC | PRN

## 2024-11-19 ENCOUNTER — Other Ambulatory Visit: Payer: Self-pay | Admitting: Family Medicine

## 2024-11-19 DIAGNOSIS — E1165 Type 2 diabetes mellitus with hyperglycemia: Secondary | ICD-10-CM

## 2024-11-19 MED ORDER — OZEMPIC (2 MG/DOSE) 8 MG/3ML ~~LOC~~ SOPN: 2.0000 mg | PEN_INJECTOR | SUBCUTANEOUS | 3 refills | Status: AC

## 2024-11-21 ENCOUNTER — Telehealth: Payer: Self-pay | Admitting: Pharmacy Technician

## 2024-11-21 NOTE — Telephone Encounter (Signed)
 Pharmacy Patient Advocate Encounter   Received notification from CoverMyMeds that prior authorization for UBRELVY  100MG  is required/requested.   Insurance verification completed.   The patient is insured through [facility].   Per test claim: PA required; PA submitted to above mentioned insurance via Latent Key/confirmation #/EOC [id number] Status is pending

## 2024-11-23 ENCOUNTER — Other Ambulatory Visit (HOSPITAL_COMMUNITY): Payer: Self-pay

## 2024-11-24 ENCOUNTER — Other Ambulatory Visit (HOSPITAL_COMMUNITY): Payer: Self-pay

## 2024-11-24 NOTE — Telephone Encounter (Signed)
 Pharmacy Patient Advocate Encounter  Received notification from [facility] that Prior Authorization for UBRELVY  100MG  has been APPROVED from 12.11.25 to 12.11.26. Unable to obtain price due to refill too soon rejection, last fill date 12.11.25 next available fill date1.4.26   PA #/Case ID/Reference #: [id number]

## 2024-11-27 ENCOUNTER — Ambulatory Visit

## 2024-12-13 NOTE — Progress Notes (Signed)
 " [facility] [address] [place] [address] Phone: [phone] Subjective:   [name] am a scribe for Dr. Claudene.   I'm seeing this patient by the request  of:  Christine Jenkins Jansky, MD  CC: Back and neck pain follow-up  YEP:Dlagzrupcz  Christine [name] is a 73 y.o. female coming in with complaint of back and neck pain. OMT 10/11/2024. Patient states that her back is hurting today and the neck getting hung from time to time. Just got back from [place] .   Medications patient has been prescribed: None  Taking:  Reviewing patient's chart since we have seen her seen in the [facility] department in November for migraine.  CT of the head was done and showed no significant changes.     NEED RECHECK LABS PROLACTIN, CBC, ferritin  Reviewed prior external information including notes and imaging from previsou exam, outside providers and external EMR if available.   As well as notes that were available from care everywhere and other healthcare systems.  Past medical history, social, surgical and family history all reviewed in electronic medical record.  No pertanent information unless stated regarding to the chief complaint.   Past Medical History:  Diagnosis Date   Acute bursitis of right shoulder 08/29/2018   Right shoulder injected in August 29, 2018   Allergic rhinitis    Closed displaced fracture of fifth metatarsal bone of left foot 03/16/2017   DEPRESSIVE DISORDER NOT ELSEWHERE CLASSIFIED 03/27/2010   Qualifier: Diagnosis of  By: [name], [name]   [name], NP , [facility] [place] [place]     Diabetes (HCC) 04/02/2016   Diabetes mellitus    Essential hypertension 03/14/2008   Qualifier: Diagnosis of  By: [name], [name]     Fibromyalgia 08/12/2011   Diagnosed by [name] , Rheumatologist 2010    GERD (gastroesophageal reflux disease) 12/25/2016   Gluteal tendonitis of right buttock 10/19/2017   Injected in October 19, 2017 Injected October 07, 2018   Greater trochanteric bursitis of left hip 06/17/2016   Greater trochanteric bursitis of right hip 11/27/2015   Injected 11/27/2015    History of recurrent UTIs    [name]   HTN (hypertension)    Hx of osteopenia 10/11/2012   [facility] DEXA; ordered by [name] Lowest T score: -2.1 @ lumbar spine @ [facility] 11/01/12; decrease of 8.5% vs 05/2009. There is 9.2% risk over 10 yrs History fracture left elbow @ 4 Fractured left wrist , right elbow and nose post fall July/18/2013. Dr. [name] No FH Osteoporosis No PMH of bisphosphonate therapy (generic Fosamax  Rxed but not filled  due to polypharmacy )    Hyperlipidemia    Hyperlipidemia associated with type 2 diabetes mellitus (HCC) 01/18/2007   Qualifier: Diagnosis of  By: [name]  With DM LDL goal = < 100, ideally < 70. Mi in father @ 87; 2 brothers @ 87 & 63     Lumbar radiculopathy 04/11/2008   Qualifier: Diagnosis of  By: [name], [name] Dock well to epidural 01/12/2017  repeat epidural given May 2018   Migraine headache    quiescent   Nonallopathic lesion of cervical region 07/19/2014   Nonallopathic lesion of lumbosacral region 01/15/2016   Nonallopathic lesion of sacral region 06/27/2018   Nonallopathic lesion-rib cage 09/26/2014   Palpitations 04/15/2011   Rotator cuff impingement syndrome of left shoulder 05/11/2014   Seasonal allergic rhinitis 03/21/2012   Sinusitis,  chronic 04/20/2016   Sleep disorder    Dr Dohmier   Subluxation of peroneal tendon of right foot 11/10/2017   TIA (transient ischemic attack)    TRANSIENT ISCHEMIC ATTACKS, HX OF 02/02/2008   Qualifier: Diagnosis of  By: Claudene GUSS Heron SERITA ADVRS EFF UNS RX MEDICINAL&BIOLOGICAL SBSTNC 02/02/2008   Qualifier: Diagnosis of  By: [name], [name] [name] AND MYOSITIS 02/02/2008   Qualifier: Diagnosis of  By: [name], [name]     UTI (urinary tract infection) 10/05/2014   Viral upper respiratory tract infection with  cough 12/31/2014   Vitamin D  deficiency 05/25/2008   Qualifier: Diagnosis of  By: [name], [name]      Allergies[1]   Review of Systems:  No  visual changes, nausea, vomiting, diarrhea, constipation, dizziness, abdominal pain, skin rash, fevers, chills, night sweats, weight loss, swollen lymph nodes, body aches, joint swelling, chest pain, shortness of breath, mood changes. POSITIVE muscle aches, headache  Objective  Blood pressure 130/70, pulse 80, height 5' 5 (1.651 m), weight 134 lb (60.8 kg).   General: No apparent distress alert and oriented x3 mood and affect normal, dressed appropriately.  HEENT: Pupils equal, extraocular movements intact  Respiratory: Patient's speak in full sentences and does not appear short of breath  Cardiovascular: No lower extremity edema, non tender, no erythema  Gait normal MSK:  Back tightness noted.  Seems to be tighter than in the paraspinal musculature of the scapula.  Tightness in the occipital area as well.  Patient has some limited sidebending.  Tightness noted in the paraspinal musculature right greater than left of the lower back as well.  Osteopathic findings  C2 flexed rotated and side bent right C6 flexed rotated and side bent left T3 extended rotated and side bent right inhaled rib T9 extended rotated and side bent left L2 flexed rotated and side bent right Sacrum right on right       Assessment and Plan:  Cervical disc disorder with radiculopathy of cervical region Has been doing very well but may have an exacerbation with patient going to be off her Ozempic .  Seem to decrease inflammation overall.  Unfortunately it will be cost prohibitive.  Discussed that she should talk to primary care with patient having a history of cardiovascular disorder, family history of cardiovascular disorder and likely fatty liver she could be a candidate for some of the other GLP-1's.  Will continue to stay active.  Follow-up with me again in 6 to 8  weeks    Nonallopathic problems  Decision today to treat with OMT was based on Physical Exam  After verbal consent patient was treated with HVLA, ME, FPR techniques in cervical, rib, thoracic, lumbar, and sacral  areas  Patient tolerated the procedure well with improvement in symptoms  Patient given exercises, stretches and lifestyle modifications  See medications in patient instructions if given  Patient will follow up in 4-8 weeks    The above documentation has been reviewed and is accurate and complete Fallynn Gravett M Yuvin Bussiere, DO          Note: This dictation was prepared with Dragon dictation along with smaller phrase technology. Any transcriptional errors that result from this process are unintentional.            [1]  Allergies Allergen Reactions   Vioxx [Rofecoxib] Other (See Comments)    Excess BP which caused TIA    Prednisone  Other (See Comments)  Mental status changes with high-dose oral agent. Tolerates epidural steroid injections.  No associated rash or fever   Colesevelam Hcl Other (See Comments)   Ezetimibe  Other (See Comments)    myalgias   Macrodantin  [Nitrofurantoin  Macrocrystal] Hives   Nitrofurantoin  Other (See Comments)   Sulfa  Antibiotics Other (See Comments)    Hives   Colesevelam Other (See Comments)    Leg Pain   "

## 2024-12-19 ENCOUNTER — Ambulatory Visit (INDEPENDENT_AMBULATORY_CARE_PROVIDER_SITE_OTHER): Admitting: Family Medicine

## 2024-12-19 ENCOUNTER — Encounter: Payer: Self-pay | Admitting: Family Medicine

## 2024-12-19 VITALS — BP 130/70 | HR 80 | Ht 65.0 in | Wt 134.0 lb

## 2024-12-19 DIAGNOSIS — M9908 Segmental and somatic dysfunction of rib cage: Secondary | ICD-10-CM | POA: Diagnosis not present

## 2024-12-19 DIAGNOSIS — M9901 Segmental and somatic dysfunction of cervical region: Secondary | ICD-10-CM

## 2024-12-19 DIAGNOSIS — M9904 Segmental and somatic dysfunction of sacral region: Secondary | ICD-10-CM

## 2024-12-19 DIAGNOSIS — M9902 Segmental and somatic dysfunction of thoracic region: Secondary | ICD-10-CM

## 2024-12-19 DIAGNOSIS — M5010 Cervical disc disorder with radiculopathy, unspecified cervical region: Secondary | ICD-10-CM | POA: Diagnosis not present

## 2024-12-19 DIAGNOSIS — M9903 Segmental and somatic dysfunction of lumbar region: Secondary | ICD-10-CM

## 2024-12-19 NOTE — Assessment & Plan Note (Signed)
 Has been doing very well but may have an exacerbation with patient going to be off her Ozempic .  Seem to decrease inflammation overall.  Unfortunately it will be cost prohibitive.  Discussed that she should talk to primary care with patient having a history of cardiovascular disorder, family history of cardiovascular disorder and likely fatty liver she could be a candidate for some of the other GLP-1's.  Will continue to stay active.  Follow-up with me again in 6 to 8 weeks

## 2024-12-19 NOTE — Patient Instructions (Signed)
Good to see you! See you again in 6-7 weeks 

## 2024-12-20 ENCOUNTER — Other Ambulatory Visit (HOSPITAL_COMMUNITY): Payer: Self-pay

## 2024-12-20 ENCOUNTER — Encounter: Payer: Self-pay | Admitting: Internal Medicine

## 2024-12-21 ENCOUNTER — Telehealth: Payer: Self-pay | Admitting: Pharmacy Technician

## 2024-12-21 ENCOUNTER — Ambulatory Visit (INDEPENDENT_AMBULATORY_CARE_PROVIDER_SITE_OTHER)

## 2024-12-21 VITALS — BP 153/71 | HR 76 | Temp 97.2°F | Resp 14 | Ht 66.0 in | Wt 131.2 lb

## 2024-12-21 DIAGNOSIS — I2510 Atherosclerotic heart disease of native coronary artery without angina pectoris: Secondary | ICD-10-CM

## 2024-12-21 DIAGNOSIS — E1169 Type 2 diabetes mellitus with other specified complication: Secondary | ICD-10-CM | POA: Diagnosis not present

## 2024-12-21 DIAGNOSIS — E785 Hyperlipidemia, unspecified: Secondary | ICD-10-CM

## 2024-12-21 MED ORDER — INCLISIRAN SODIUM 284 MG/1.5ML ~~LOC~~ SOSY
284.0000 mg | PREFILLED_SYRINGE | Freq: Once | SUBCUTANEOUS | Status: AC
  Administered 2024-12-21: 284 mg via SUBCUTANEOUS
  Filled 2024-12-21: qty 1.5

## 2024-12-21 NOTE — Progress Notes (Signed)
 Diagnosis: , Hyperlipidemia  Provider:  Mannam, Praveen MD  Procedure: Injection  Leqvio  (inclisiran), Dose: 284 mg, Site: subcutaneous, Number of injections: 1  Injection Site(s): Left arm  Post Care: Injection  Discharge: Condition: Good, Destination: Home . AVS Declined  Performed by:  Donny Childes, RN

## 2024-12-21 NOTE — Telephone Encounter (Signed)
 Auth Submission: NO AUTH NEEDED Site of care: Site of care: [facility] Payer: Medicare A/B / [employer] Medication & CPT/J Code(s) submitted: Leqvio  (Inclisiran) J1306 Diagnosis Code: E78.5 Route of submission (phone, fax, portal): n/a Phone # Fax # Auth type: Buy/Bill PB Units/visits requested: 2 Reference number: n/a Approval from: 12/21/2024 to 01/13/2026

## 2025-01-03 ENCOUNTER — Encounter: Payer: Self-pay | Admitting: Family Medicine

## 2025-01-03 ENCOUNTER — Ambulatory Visit

## 2025-01-03 ENCOUNTER — Ambulatory Visit: Admitting: Family Medicine

## 2025-01-03 VITALS — BP 124/72 | HR 72 | Temp 97.3°F | Ht 66.0 in | Wt 130.6 lb

## 2025-01-03 DIAGNOSIS — F339 Major depressive disorder, recurrent, unspecified: Secondary | ICD-10-CM

## 2025-01-03 DIAGNOSIS — G43009 Migraine without aura, not intractable, without status migrainosus: Secondary | ICD-10-CM

## 2025-01-03 DIAGNOSIS — E785 Hyperlipidemia, unspecified: Secondary | ICD-10-CM | POA: Diagnosis not present

## 2025-01-03 DIAGNOSIS — Z0000 Encounter for general adult medical examination without abnormal findings: Secondary | ICD-10-CM | POA: Diagnosis not present

## 2025-01-03 DIAGNOSIS — E1165 Type 2 diabetes mellitus with hyperglycemia: Secondary | ICD-10-CM

## 2025-01-03 DIAGNOSIS — Z7985 Long-term (current) use of injectable non-insulin antidiabetic drugs: Secondary | ICD-10-CM

## 2025-01-03 DIAGNOSIS — F32A Depression, unspecified: Secondary | ICD-10-CM

## 2025-01-03 DIAGNOSIS — E1169 Type 2 diabetes mellitus with other specified complication: Secondary | ICD-10-CM | POA: Diagnosis not present

## 2025-01-03 DIAGNOSIS — I10 Essential (primary) hypertension: Secondary | ICD-10-CM

## 2025-01-03 NOTE — Progress Notes (Signed)
 "  Subjective:     Patient ID: Christine [name], female    DOB: 10-03-1951, 74 y.o.   MRN: [id number]  Chief Complaint  Patient presents with   Diabetes    Pt is here for chronic issues    Discussed the use of AI scribe software for clinical note transcription with the patient, who gave verbal consent to proceed.  History of Present Illness Christine [name] is a 74 year old female with diabetes and hypertension who presents for a follow-up visit.  She is currently taking atorvastatin  and Leqvio  for cholesterol management-DM and h/o TIA so LDL<50-70 goal, which has successfully lowered her LDL to below 50. She previously had issues with atorvastatin .  She is on metoprolol , taking half a tablet (12 mg) daily for blood pressure management.  She has been using Ozempic  for diabetes management, but cost concerns have arisen as the price increased from $300 to $650 per month. Her blood sugar levels typically range between 120 and 140 mg/dL in the morning. Occ has strip errors and runs out  She takes trazodone , half a tablet nightly for sleep, and has an ample supply. She also has a surplus of lorazepam  and duloxetine , taking 30 mg and 60 mg doses respectively.  She reports running out of test strips for her glucose meter and has been using multiple strips due to inconsistent readings. Medicare allows her to check her blood sugar once a day, but she has been checking more frequently due to meter issues.  She has been prescribed zonisamide  and Ubrelvy  for migraine management. She experienced 'explosive diarrhea' after taking Ubrelvy  once and has lost weight since starting zonisamide , reporting a current weight of 129 lbs.  She mentions a history of migraines and depression, noting an improvement in her mood and a decrease in headache frequency. She attributes some of her previous symptoms to depression, which she feels has lifted recently.    Health Maintenance Due  Topic Date Due    Diabetic kidney evaluation - Urine ACR  12/31/2024    Past Medical History:  Diagnosis Date   Acute bursitis of right shoulder 08/29/2018   Right shoulder injected in August 29, 2018   Allergic rhinitis    Closed displaced fracture of fifth metatarsal bone of left foot 03/16/2017   DEPRESSIVE DISORDER NOT ELSEWHERE CLASSIFIED 03/27/2010   Qualifier: Diagnosis of  By: [name], [name]   [name], NP , [facility] [place] [place]     Diabetes (HCC) 04/02/2016   Diabetes mellitus    Essential hypertension 03/14/2008   Qualifier: Diagnosis of  By: [name], [name]     Fibromyalgia 08/12/2011   Diagnosed by [name] , Rheumatologist 2010    GERD (gastroesophageal reflux disease) 12/25/2016   Gluteal tendonitis of right buttock 10/19/2017   Injected in October 19, 2017 Injected October 07, 2018   Greater trochanteric bursitis of left hip 06/17/2016   Greater trochanteric bursitis of right hip 11/27/2015   Injected 11/27/2015    History of recurrent UTIs    [name]   HTN (hypertension)    Hx of osteopenia 10/11/2012   [facility] DEXA; ordered by [name] Lowest T score: -2.1 @ lumbar spine @ [facility] 11/01/12; decrease of 8.5% vs 05/2009. There is 9.2% risk over 10 yrs History fracture left elbow @ 6 Fractured left wrist , right elbow and nose post fall July/18/2013. Dr. [name] No FH Osteoporosis No PMH of bisphosphonate therapy (generic Fosamax  Rxed but not  filled  due to polypharmacy )    Hyperlipidemia    Hyperlipidemia associated with type 2 diabetes mellitus (HCC) 01/18/2007   Qualifier: Diagnosis of  By: [name]  With DM LDL goal = < 100, ideally < 70. Mi in father @ 28; 2 brothers @ 65 & 26     Lumbar radiculopathy 04/11/2008   Qualifier: Diagnosis of  By: [name], [name] Dock well to epidural 01/12/2017  repeat epidural given May 2018   Migraine headache    quiescent   Nonallopathic lesion of cervical region 07/19/2014   Nonallopathic lesion of lumbosacral  region 01/15/2016   Nonallopathic lesion of sacral region 06/27/2018   Nonallopathic lesion-rib cage 09/26/2014   Palpitations 04/15/2011   Rotator cuff impingement syndrome of left shoulder 05/11/2014   Seasonal allergic rhinitis 03/21/2012   Sinusitis, chronic 04/20/2016   Sleep disorder    Dr Dohmier   Subluxation of peroneal tendon of right foot 11/10/2017   TIA (transient ischemic attack)    TRANSIENT ISCHEMIC ATTACKS, HX OF 02/02/2008   Qualifier: Diagnosis of  By: Claudene GUSS Heron SERITA ADVRS EFF UNS RX MEDICINAL&BIOLOGICAL SBSTNC 02/02/2008   Qualifier: Diagnosis of  By: [name], [name] [name] AND MYOSITIS 02/02/2008   Qualifier: Diagnosis of  By: [name], [name]     UTI (urinary tract infection) 10/05/2014   Viral upper respiratory tract infection with cough 12/31/2014   Vitamin D  deficiency 05/25/2008   Qualifier: Diagnosis of  By: [name], [name]      Past Surgical History:  Procedure Laterality Date   COLONOSCOPY     Dr Avram; hemorrhoids   CORONARY ANGIOPLASTY  12/15/1995   for chest pain- negative    FOOT MASS EXCISION Left 04/08/2023   KNEE ARTHROSCOPY Left 2023   POLYPECTOMY  12/14/2000   benign, hyperplastic polyp; rectal bleeding 2008   TONSILLECTOMY     TOTAL ABDOMINAL HYSTERECTOMY  12/14/1981   BSO for endometriosis   WISDOM TOOTH EXTRACTION      Current Medications[1]  Allergies[2] ROS neg/noncontributory except as noted HPI/below      Objective:     BP 124/72 (BP Location: Left Arm, Patient Position: Sitting, Cuff Size: Normal)   Pulse 72   Temp (!) 97.3 F (36.3 C) (Temporal)   Ht 5' 6 (1.676 m)   Wt 130 lb 9.6 oz (59.2 kg)   LMP  (LMP Unknown)   SpO2 95%   BMI 21.08 kg/m  Wt Readings from Last 3 Encounters:  01/03/25 130 lb 9.6 oz (59.2 kg)  01/03/25 130 lb 9.6 oz (59.2 kg)  12/21/24 131 lb 3.2 oz (59.5 kg)    Physical Exam MEASUREMENTS: Weight- 129. GENERAL: Well developed, well nourished, no acute  distress. HEAD EYES EARS NOSE THROAT: Normocephalic, atraumatic, conjunctiva not injected, sclera nonicteric. CARDIAC: Regular rate and rhythm, S1 S2 present, no murmur, dorsalis pedis 1 plus bilaterally, heart normal. NECK: Supple, no thyromegaly, no nodes, no carotid bruits. LUNGS: Clear to auscultation bilaterally, no wheezes. ABDOMEN: Bowel sounds present, soft, non-tender, non-distended, no hepatosplenomegaly, no masses. EXTREMITIES: No edema. MUSCULOSKELETAL: No gross abnormalities. NEUROLOGICAL: Alert and oriented x3, cranial nerves II through XII intact. PSYCHIATRIC: Normal mood, good eye contact.       Assessment & Plan:  Type 2 diabetes mellitus with hyperglycemia, without long-term current use of insulin  (HCC)  Essential hypertension  Hyperlipidemia associated with type 2 diabetes mellitus (HCC)  Migraine without aura and without status  migrainosus, not intractable  Depression, recurrent  Long-term current use of injectable noninsulin antidiabetic medication    Assessment and Plan Assessment & Plan Type 2 diabetes mellitus with hyperglycemia and hyperlipidemia   She is currently on Ozempic , but the cost has increased to $650/month. LDL cholesterol is at goal, below 50 mg/dL, and morning blood glucose levels range between 120-140 mg/dL. There are concerns about cost and potential weight gain if insulin  is reintroduced. Alternative GLP-1 receptor agonists like Wegovy and Zepbound were discussed, but cost remains a barrier. There is potential for cost reduction after meeting the deductible and possible future price reductions due to new medications. Attempt to fill the Ozempic  prescription today to avoid prior authorization delay. Consider reducing the Ozempic  dose to extend supply and manage cost. Monitor blood glucose levels every other day or a couple of times a week. Reassess diabetes management in three months.  Essential hypertension   Her hypertension is currently  managed with metoprolol  12.5 mg daily.  Migraine   She is on zonisamide  and Ubrelvy . Experienced explosive diarrhea after taking Ubrelvy  once. Migraines have decreased in frequency, possibly due to improved mood and reduced depression. Ubrelvy  is to be used as needed for acute migraine attacks, considering cost. Continue zonisamide  for migraine prevention.  Depression   She reports improvement in mood and reduction in depressive symptoms, attributing this to reduced frequency of migraines and overall better health. Continue current management and monitor mood.  HLD-managed w/meds.  continue   Return in about 3 months (around 04/03/2025) for chronic follow-up.  Jenkins CHRISTELLA Carrel, MD     [1]  Current Outpatient Medications:    acetaminophen  (TYLENOL ) 500 MG tablet, Take 500 mg by mouth as needed., Disp: , Rfl:    aspirin  81 MG tablet, Take 81 mg by mouth daily., Disp: , Rfl:    atorvastatin  (LIPITOR) 40 MG tablet, Take 1 tablet (40 mg total) by mouth daily., Disp: 90 tablet, Rfl: 3   cephALEXin  (KEFLEX ) 250 MG capsule, TAKE 1 CAPSULE BY MOUTH EVERY DAY, Disp: 90 capsule, Rfl: 1   DULoxetine  (CYMBALTA ) 30 MG capsule, Take 30 mg by mouth daily., Disp: , Rfl:    DULoxetine  (CYMBALTA ) 60 MG capsule, Take 60 mg by mouth daily. Taking 90 mg daily, Disp: , Rfl:    inclisiran (LEQVIO ) 284 MG/1.5ML [facility] injection, as directed Subcutaneous, Disp: , Rfl:    Insulin  Pen Needle (PEN NEEDLES) 32G X 4 MM MISC, 1 each by Does not apply route once a week., Disp: 30 each, Rfl: 1   Lancets (ONETOUCH ULTRASOFT) lancets, Check blood sugar once daily. Dx code: 250.00, Disp: 100 each, Rfl: 12   LORazepam  (ATIVAN ) 0.5 MG tablet, Take 0.5 mg by mouth as needed for anxiety., Disp: , Rfl:    metoprolol  succinate (TOPROL -XL) 25 MG 24 hr tablet, Take 0.5 tablets (12.5 mg total) by mouth daily., Disp: 45 tablet, Rfl: 1   omeprazole  (PRILOSEC) 20 MG capsule, TAKE 1 CAPSULE BY MOUTH EVERY DAY, Disp: 90 capsule, Rfl: 3    ONETOUCH ULTRA TEST test strip, USE 1 IN THE MORNING AND AT BEDTIME. USE AS INSTRUCTED, Disp: 200 strip, Rfl: 3   promethazine  (PHENERGAN ) 25 MG tablet, Take 1 tablet (25 mg total) by mouth every 8 (eight) hours as needed for nausea or vomiting., Disp: 20 tablet, Rfl: 2   Semaglutide , 2 MG/DOSE, (OZEMPIC , 2 MG/DOSE,) 8 MG/3ML SOPN, Inject 2 mg into the skin once a week., Disp: 9 mL, Rfl: 3   tiZANidine  (ZANAFLEX ) 4  MG tablet, TAKE 1 TABLET BY MOUTH AT BEDTIME., Disp: 90 tablet, Rfl: 1   traZODone  (DESYREL ) 50 MG tablet, TAKE 1/2 TO 1 TABLET BY MOUTH AT BEDTIME AS NEEDED FOR SLEEP, Disp: 90 tablet, Rfl: 1   Ubrogepant  (UBRELVY ) 100 MG TABS, Take 1 tablet (100 mg total) by mouth as needed. May repeat after 2 hours.  Maximum 2 tablets in 24 hours., Disp: 10 tablet, Rfl: 5   vitamin B-12 (CYANOCOBALAMIN ) 1000 MCG tablet, Take 1,000 mcg by mouth daily., Disp: , Rfl:    Vitamin D , Ergocalciferol , (DRISDOL ) 1.25 MG (50000 UNIT) CAPS capsule, TAKE 1 CAPSULE BY MOUTH ONE TIME PER WEEK, Disp: 12 capsule, Rfl: 0   zoledronic  acid (RECLAST ) 5 MG/100ML SOLN injection, as directed Intravenous, Disp: , Rfl:    zonisamide  (ZONEGRAN ) 25 MG capsule, Take 2 capsules (50 mg total) by mouth daily., Disp: 60 capsule, Rfl: 5 [2]  Allergies Allergen Reactions   Vioxx [Rofecoxib] Other (See Comments)    Excess BP which caused TIA    Prednisone  Other (See Comments)    Mental status changes with high-dose oral agent. Tolerates epidural steroid injections.  No associated rash or fever   Colesevelam Hcl Other (See Comments)   Ezetimibe  Other (See Comments)    myalgias   Macrodantin  [Nitrofurantoin  Macrocrystal] Hives   Nitrofurantoin  Other (See Comments)   Sulfa  Antibiotics Other (See Comments)    Hives   Colesevelam Other (See Comments)    Leg Pain   "

## 2025-01-03 NOTE — Patient Instructions (Signed)
 Ms. Christine [name],  Thank you for taking the time for your Medicare Wellness Visit. I appreciate your continued commitment to your health goals. Please review the care plan we discussed, and feel free to reach out if I can assist you further.  Please note that Annual Wellness Visits do not include a physical exam. Some assessments may be limited, especially if the visit was conducted virtually. If needed, we may recommend an in-person follow-up with your provider.  Ongoing Care Seeing your primary care provider every 3 to 6 months helps us  monitor your health and provide consistent, personalized care.   Referrals If a referral was made during today's visit and you haven't received any updates within two weeks, please contact the referred provider directly to check on the status.  Recommended Screenings:  Health Maintenance  Topic Date Due   Zoster (Shingles) Vaccine (1 of 2) Never done   DTaP/Tdap/Td vaccine (2 - Tdap) 06/08/2019   Eye exam for diabetics  07/15/2024   Medicare Annual Wellness Visit  09/01/2024   Kidney health urinalysis for diabetes  12/31/2024   COVID-19 Vaccine (5 - 2025-26 season) 11/15/2025*   Complete foot exam   03/30/2025   Hemoglobin A1C  04/29/2025   Yearly kidney function blood test for diabetes  10/22/2025   Pneumococcal Vaccine for age over 56  Completed   Flu Shot  Completed   Osteoporosis screening with Bone Density Scan  Completed   Hepatitis C Screening  Completed   Meningitis B Vaccine  Aged Out   Breast Cancer Screening  Discontinued   Colon Cancer Screening  Discontinued  *Topic was postponed. The date shown is not the original due date.       11/13/2024   11:21 AM  Advanced Directives  Does Patient Have a Medical Advance Directive? Yes  Type of Estate Agent of [place];Living will    Vision: Annual vision screenings are recommended for early detection of glaucoma, cataracts, and diabetic retinopathy. These exams can also  reveal signs of chronic conditions such as diabetes and high blood pressure.  Dental: Annual dental screenings help detect early signs of oral cancer, gum disease, and other conditions linked to overall health, including heart disease and diabetes.  Please see the attached documents for additional preventive care recommendations.

## 2025-01-03 NOTE — Progress Notes (Signed)
 "  Chief Complaint  Patient presents with   Medicare Wellness     Subjective:   Christine [name] is a 74 y.o. female who presents for a Medicare Annual Wellness Visit.  Visit info / Clinical Intake: Medicare Wellness Visit Type:: Subsequent Annual Wellness Visit Persons participating in visit and providing information:: patient Medicare Wellness Visit Mode:: In-person (required for WTM) Interpreter Needed?: No Pre-visit prep was completed: yes AWV questionnaire completed by patient prior to visit?: no Living arrangements:: lives with spouse/significant other Patient's Overall Health Status Rating: good Typical amount of pain: some Does pain affect daily life?: no Are you currently prescribed opioids?: no  Dietary Habits and Nutritional Risks How many meals a day?: 3 Eats fruit and vegetables daily?: yes Most meals are obtained by: preparing own meals; eating out In the last 2 weeks, have you had any of the following?: (!) nausea, vomiting, diarrhea (nausea with headache) Diabetic:: (!) yes Any non-healing wounds?: no How often do you check your BS?: 1 Would you like to be referred to a Nutritionist or for Diabetic Management? : no  Functional Status Activities of Daily Living (to include ambulation/medication): Independent Ambulation: Independent with device- listed below Home Assistive Devices/Equipment: Contact lenses Medication Administration: Independent Home Management (perform basic housework or laundry): Independent Manage your own finances?: yes Primary transportation is: driving Concerns about vision?: no *vision screening is required for WTM* Concerns about hearing?: (!) yes (slight HOH) Uses hearing aids?: no Hear whispered voice?: yes  Fall Screening Falls in the past year?: 0 Number of falls in past year: 0 Was there an injury with Fall?: 0 Fall Risk Category Calculator: 0 Patient Fall Risk Level: Low Fall Risk  Fall Risk Patient at Risk for Falls  Due to: Impaired balance/gait Fall risk Follow up: Falls evaluation completed  Home and Transportation Safety: All rugs have non-skid backing?: yes All stairs or steps have railings?: yes Grab bars in the bathtub or shower?: yes Have non-skid surface in bathtub or shower?: yes Good home lighting?: yes Regular seat belt use?: yes Hospital stays in the last year:: no  Cognitive Assessment Difficulty concentrating, remembering, or making decisions? : no Will 6CIT or Mini Cog be Completed: yes What year is it?: 0 points What month is it?: 0 points Give patient an address phrase to remember (5 components): [address] [place]  About what time is it?: 0 points Count backwards from 20 to 1: 0 points Say the months of the year in reverse: 0 points Repeat the address phrase from earlier: 0 points 6 CIT Score: 0 points  Advance Directives (For Healthcare) Does Patient Have a Medical Advance Directive?: Yes Type of Advance Directive: Healthcare Power of Attorney Copy of Healthcare Power of Attorney in Chart?: No - copy requested  Reviewed/Updated  Reviewed/Updated: Reviewed All (Medical, Surgical, Family, Medications, Allergies, Care Teams, Patient Goals)    Allergies (verified) Vioxx [rofecoxib], Prednisone , Colesevelam hcl, Ezetimibe , Macrodantin  [nitrofurantoin  macrocrystal], Nitrofurantoin , Sulfa  antibiotics, and Colesevelam   Current Medications (verified) Outpatient Encounter Medications as of 01/03/2025  Medication Sig   acetaminophen  (TYLENOL ) 500 MG tablet Take 500 mg by mouth as needed.   aspirin  81 MG tablet Take 81 mg by mouth daily.   atorvastatin  (LIPITOR) 40 MG tablet Take 1 tablet (40 mg total) by mouth daily.   cephALEXin  (KEFLEX ) 250 MG capsule TAKE 1 CAPSULE BY MOUTH EVERY DAY   DULoxetine  (CYMBALTA ) 30 MG capsule Take 30 mg by mouth daily.   DULoxetine  (CYMBALTA ) 60 MG capsule  Take 60 mg by mouth daily. Taking 90 mg daily   LORazepam  (ATIVAN ) 0.5 MG tablet Take  0.5 mg by mouth as needed for anxiety.   metoprolol  succinate (TOPROL -XL) 25 MG 24 hr tablet Take 0.5 tablets (12.5 mg total) by mouth daily.   omeprazole  (PRILOSEC) 20 MG capsule TAKE 1 CAPSULE BY MOUTH EVERY DAY   promethazine  (PHENERGAN ) 25 MG tablet Take 1 tablet (25 mg total) by mouth every 8 (eight) hours as needed for nausea or vomiting.   tiZANidine  (ZANAFLEX ) 4 MG tablet TAKE 1 TABLET BY MOUTH AT BEDTIME.   traZODone  (DESYREL ) 50 MG tablet TAKE 1/2 TO 1 TABLET BY MOUTH AT BEDTIME AS NEEDED FOR SLEEP   Ubrogepant  (UBRELVY ) 100 MG TABS Take 1 tablet (100 mg total) by mouth as needed. May repeat after 2 hours.  Maximum 2 tablets in 24 hours.   vitamin B-12 (CYANOCOBALAMIN ) 1000 MCG tablet Take 1,000 mcg by mouth daily.   Vitamin D , Ergocalciferol , (DRISDOL ) 1.25 MG (50000 UNIT) CAPS capsule TAKE 1 CAPSULE BY MOUTH ONE TIME PER WEEK   zoledronic  acid (RECLAST ) 5 MG/100ML SOLN injection as directed Intravenous   zonisamide  (ZONEGRAN ) 25 MG capsule Take 2 capsules (50 mg total) by mouth daily.   inclisiran (LEQVIO ) 284 MG/1.5ML SOSY injection as directed Subcutaneous   Insulin  Pen Needle (PEN NEEDLES) 32G X 4 MM MISC 1 each by Does not apply route once a week.   Lancets (ONETOUCH ULTRASOFT) lancets Check blood sugar once daily. Dx code: 250.00   ONETOUCH ULTRA TEST test strip USE 1 IN THE MORNING AND AT BEDTIME. USE AS INSTRUCTED   potassium chloride  (KLOR-CON  M) 10 MEQ tablet TAKE 1 TABLET BY MOUTH EVERY DAY (Patient not taking: Reported on 01/03/2025)   Semaglutide , 2 MG/DOSE, (OZEMPIC , 2 MG/DOSE,) 8 MG/3ML SOPN Inject 2 mg into the skin once a week. (Patient not taking: Reported on 01/03/2025)   [DISCONTINUED] traMADol  (ULTRAM ) 50 MG tablet Take 1 tablet (50 mg total) by mouth every 6 (six) hours as needed.   No facility-administered encounter medications on file as of 01/03/2025.    History: Past Medical History:  Diagnosis Date   Acute bursitis of right shoulder 08/29/2018   Right  shoulder injected in August 29, 2018   Allergic rhinitis    Closed displaced fracture of fifth metatarsal bone of left foot 03/16/2017   DEPRESSIVE DISORDER NOT ELSEWHERE CLASSIFIED 03/27/2010   Qualifier: Diagnosis of  By: [name], [name]   [name], NP , [facility] [place] [place]     Diabetes (HCC) 04/02/2016   Diabetes mellitus    Essential hypertension 03/14/2008   Qualifier: Diagnosis of  By: [name], [name]     Fibromyalgia 08/12/2011   Diagnosed by [name] , Rheumatologist 2010    GERD (gastroesophageal reflux disease) 12/25/2016   Gluteal tendonitis of right buttock 10/19/2017   Injected in October 19, 2017 Injected October 07, 2018   Greater trochanteric bursitis of left hip 06/17/2016   Greater trochanteric bursitis of right hip 11/27/2015   Injected 11/27/2015    History of recurrent UTIs    [name]   HTN (hypertension)    Hx of osteopenia 10/11/2012   [facility] DEXA; ordered by [name] Lowest T score: -2.1 @ lumbar spine @ [facility] 11/01/12; decrease of 8.5% vs 05/2009. There is 9.2% risk over 10 yrs History fracture left elbow @ 4 Fractured left wrist , right elbow and nose post fall July/18/2013. Dr. [name] No FH Osteoporosis  No PMH of bisphosphonate therapy (generic Fosamax  Rxed but not filled  due to polypharmacy )    Hyperlipidemia    Hyperlipidemia associated with type 2 diabetes mellitus (HCC) 01/18/2007   Qualifier: Diagnosis of  By: [name]  With DM LDL goal = < 100, ideally < 70. Mi in father @ 1; 2 brothers @ 12 & 56     Lumbar radiculopathy 04/11/2008   Qualifier: Diagnosis of  By: [name], [name] Dock well to epidural 01/12/2017  repeat epidural given May 2018   Migraine headache    quiescent   Nonallopathic lesion of cervical region 07/19/2014   Nonallopathic lesion of lumbosacral region 01/15/2016   Nonallopathic lesion of sacral region 06/27/2018   Nonallopathic lesion-rib cage 09/26/2014   Palpitations 04/15/2011   Rotator cuff  impingement syndrome of left shoulder 05/11/2014   Seasonal allergic rhinitis 03/21/2012   Sinusitis, chronic 04/20/2016   Sleep disorder    Dr Dohmier   Subluxation of peroneal tendon of right foot 11/10/2017   TIA (transient ischemic attack)    TRANSIENT ISCHEMIC ATTACKS, HX OF 02/02/2008   Qualifier: Diagnosis of  By: Claudene GUSS Heron SERITA ADVRS EFF UNS RX MEDICINAL&BIOLOGICAL SBSTNC 02/02/2008   Qualifier: Diagnosis of  By: [name], [name] [name] AND MYOSITIS 02/02/2008   Qualifier: Diagnosis of  By: [name], [name]     UTI (urinary tract infection) 10/05/2014   Viral upper respiratory tract infection with cough 12/31/2014   Vitamin D  deficiency 05/25/2008   Qualifier: Diagnosis of  By: [name], [name]     Past Surgical History:  Procedure Laterality Date   COLONOSCOPY     Dr Avram; hemorrhoids   CORONARY ANGIOPLASTY  12/15/1995   for chest pain- negative    FOOT MASS EXCISION Left 04/08/2023   KNEE ARTHROSCOPY Left 2023   POLYPECTOMY  12/14/2000   benign, hyperplastic polyp; rectal bleeding 2008   TONSILLECTOMY     TOTAL ABDOMINAL HYSTERECTOMY  12/14/1981   BSO for endometriosis   WISDOM TOOTH EXTRACTION     Family History  Problem Relation Age of Onset   Migraines Mother    COPD Mother    Heart attack Father 23   Lung cancer Brother        smoker   Colon cancer Maternal Aunt    Diabetes Paternal Uncle    Mental illness Other        niece, committed suicide.   Heart attack Other        brother X 2; @ 50 & 52   Stroke Neg Hx    Social History   Occupational History   Occupation: Secretary/administrator: [employer]  Tobacco Use   Smoking status: Never   Smokeless tobacco: Never  Vaping Use   Vaping status: Never Used  Substance and Sexual Activity   Alcohol use: No   Drug use: No   Sexual activity: Not on file   Tobacco Counseling Counseling given: Not Answered  SDOH Screenings   Food Insecurity: No  Food Insecurity (01/03/2025)  Housing: Low Risk (01/03/2025)  Transportation Needs: No Transportation Needs (01/03/2025)  Utilities: Not At Risk (01/03/2025)  Alcohol Screen: Low Risk (08/25/2022)  Depression (PHQ2-9): Low Risk (01/03/2025)  Financial Resource Strain: Low Risk (08/28/2023)  Physical Activity: Inactive (01/03/2025)  Social Connections: Moderately Integrated (01/03/2025)  Stress: No Stress Concern Present (01/03/2025)  Tobacco Use: Low Risk (01/03/2025)  Health  Literacy: Adequate Health Literacy (01/03/2025)   See flowsheets for full screening details  Depression Screen PHQ 2 & 9 Depression Scale- Over the past 2 weeks, how often have you been bothered by any of the following problems? Little interest or pleasure in doing things: 0 Feeling down, depressed, or hopeless (PHQ Adolescent also includes...irritable): 0 PHQ-2 Total Score: 0 Trouble falling or staying asleep, or sleeping too much: 2 Feeling tired or having little energy: 2 Poor appetite or overeating (PHQ Adolescent also includes...weight loss): 0 Feeling bad about yourself - or that you are a failure or have let yourself or your family down: 0 Trouble concentrating on things, such as reading the newspaper or watching television (PHQ Adolescent also includes...like school work): 0 Moving or speaking so slowly that other people could have noticed. Or the opposite - being so fidgety or restless that you have been moving around a lot more than usual: 0 Thoughts that you would be better off dead, or of hurting yourself in some way: 0 PHQ-9 Total Score: 7 If you checked off any problems, how difficult have these problems made it for you to do your work, take care of things at home, or get along with other people?: Not difficult at all  Depression Treatment Depression Interventions/Treatment : Currently on Treatment     Goals Addressed               This Visit's Progress     get A1C down (pt-stated)        Get A1C  down              Objective:    Today's Vitals   01/03/25 0922  BP: 124/72  Pulse: 72  Temp: (!) 97.3 F (36.3 C)  SpO2: 95%  Weight: 130 lb 9.6 oz (59.2 kg)  Height: 5' 6 (1.676 m)   Body mass index is 21.08 kg/m.  Hearing/Vision screen Hearing Screening - Comments:: Pt stated slight HOH  Vision Screening - Comments:: Wears rx glasses - up to date with routine eye exams with My eye Dr  Immunizations and Health Maintenance Health Maintenance  Topic Date Due   Zoster Vaccines- Shingrix (1 of 2) Never done   DTaP/Tdap/Td (2 - Tdap) 06/08/2019   OPHTHALMOLOGY EXAM  07/15/2024   Diabetic kidney evaluation - Urine ACR  12/31/2024   COVID-19 Vaccine (5 - 2025-26 season) 11/15/2025 (Originally 08/14/2024)   FOOT EXAM  03/30/2025   HEMOGLOBIN A1C  04/29/2025   Diabetic kidney evaluation - eGFR measurement  10/22/2025   Medicare Annual Wellness (AWV)  01/03/2026   Pneumococcal Vaccine: 50+ Years  Completed   Influenza Vaccine  Completed   Bone Density Scan  Completed   Hepatitis C Screening  Completed   Meningococcal B Vaccine  Aged Out   Mammogram  Discontinued   Colonoscopy  Discontinued        Assessment/Plan:  This is a routine wellness examination for Eddy.  Patient Care Team: [name], MD as PCP - General (Family Medicine) Okey Vina GAILS, MD as PCP - Cardiology (Cardiology) Skeet Juliene SAUNDERS, DO as Consulting Physician (Neurology) Szabat, Toribio BROCKS, [facility] (Inactive) (Pharmacist) Rosslyn Dino HERO, MD as Consulting Physician (Neurosurgery) Skeet Juliene SAUNDERS, DO as Consulting Physician (Neurology)  I have personally reviewed and noted the following in the patients chart:   Medical and social history Use of alcohol, tobacco or illicit drugs  Current medications and supplements including opioid prescriptions. Functional ability and status Nutritional status Physical activity  Advanced directives List of other physicians Hospitalizations, surgeries, and ER  visits in previous 12 months Vitals Screenings to include cognitive, depression, and falls Referrals and appointments  No orders of the defined types were placed in this encounter.  In addition, I have reviewed and discussed with patient certain preventive protocols, quality metrics, and best practice recommendations. A written personalized care plan for preventive services as well as general preventive health recommendations were provided to patient.   Ellouise VEAR Haws, LPN   [id number]/[id number]   Return in about 1 year (around 01/07/2026).  After Visit Summary: (In Person-Printed) AVS printed and given to the patient  Nurse Note HM Addressed: Labs Due Diabetic kidney evaluation  Pt stated completed eye exam on 07/2024 My eye dr in [place]. Vaccines discussed    "

## 2025-01-03 NOTE — Patient Instructions (Signed)

## 2025-01-31 ENCOUNTER — Ambulatory Visit: Admitting: Family Medicine

## 2025-04-06 ENCOUNTER — Ambulatory Visit: Admitting: Family Medicine

## 2025-06-14 ENCOUNTER — Ambulatory Visit: Admitting: Neurology

## 2025-06-20 ENCOUNTER — Ambulatory Visit

## 2026-01-08 ENCOUNTER — Ambulatory Visit
